# Patient Record
Sex: Female | Born: 1940 | Race: White | Hispanic: No | Marital: Married | Smoking: Former smoker
Health system: Southern US, Community
[De-identification: ages and names within clinical notes are randomized; demographics above are authoritative.]

## PROBLEM LIST (undated history)

## (undated) DIAGNOSIS — I509 Heart failure, unspecified: Secondary | ICD-10-CM

## (undated) DIAGNOSIS — I10 Essential (primary) hypertension: Secondary | ICD-10-CM

## (undated) DIAGNOSIS — K219 Gastro-esophageal reflux disease without esophagitis: Secondary | ICD-10-CM

## (undated) DIAGNOSIS — I2510 Atherosclerotic heart disease of native coronary artery without angina pectoris: Secondary | ICD-10-CM

## (undated) DIAGNOSIS — Z87891 Personal history of nicotine dependence: Secondary | ICD-10-CM

## (undated) DIAGNOSIS — D638 Anemia in other chronic diseases classified elsewhere: Secondary | ICD-10-CM

## (undated) DIAGNOSIS — E538 Deficiency of other specified B group vitamins: Secondary | ICD-10-CM

## (undated) DIAGNOSIS — N189 Chronic kidney disease, unspecified: Secondary | ICD-10-CM

## (undated) DIAGNOSIS — I5042 Chronic combined systolic (congestive) and diastolic (congestive) heart failure: Secondary | ICD-10-CM

## (undated) DIAGNOSIS — Q6119 Other polycystic kidney, infantile type: Secondary | ICD-10-CM

## (undated) DIAGNOSIS — E785 Hyperlipidemia, unspecified: Secondary | ICD-10-CM

## (undated) DIAGNOSIS — J449 Chronic obstructive pulmonary disease, unspecified: Secondary | ICD-10-CM

## (undated) DIAGNOSIS — N184 Chronic kidney disease, stage 4 (severe): Secondary | ICD-10-CM

## (undated) DIAGNOSIS — E119 Type 2 diabetes mellitus without complications: Secondary | ICD-10-CM

## (undated) DIAGNOSIS — I255 Ischemic cardiomyopathy: Secondary | ICD-10-CM

## (undated) DIAGNOSIS — M87059 Idiopathic aseptic necrosis of unspecified femur: Secondary | ICD-10-CM

## (undated) DIAGNOSIS — E039 Hypothyroidism, unspecified: Secondary | ICD-10-CM

## (undated) HISTORY — PX: VESICOVAGINAL FISTULA CLOSURE W/ TAH: SUR271

## (undated) HISTORY — DX: Chronic kidney disease, unspecified: N18.9

## (undated) HISTORY — DX: Chronic kidney disease, stage 4 (severe): N18.4

## (undated) HISTORY — DX: Idiopathic aseptic necrosis of unspecified femur: M87.059

## (undated) HISTORY — DX: Essential (primary) hypertension: I10

## (undated) HISTORY — DX: Other polycystic kidney, infantile type: Q61.19

## (undated) HISTORY — PX: ULNAR NERVE REPAIR: SHX2594

## (undated) HISTORY — DX: Heart failure, unspecified: I50.9

## (undated) HISTORY — PX: CHOLECYSTECTOMY: SHX55

## (undated) HISTORY — DX: Ischemic cardiomyopathy: I25.5

## (undated) HISTORY — DX: Type 2 diabetes mellitus without complications: E11.9

## (undated) HISTORY — DX: Gastro-esophageal reflux disease without esophagitis: K21.9

## (undated) HISTORY — DX: Hypothyroidism, unspecified: E03.9

## (undated) HISTORY — DX: Atherosclerotic heart disease of native coronary artery without angina pectoris: I25.10

## (undated) HISTORY — DX: Chronic obstructive pulmonary disease, unspecified: J44.9

## (undated) HISTORY — PX: OTHER SURGICAL HISTORY: SHX169

## (undated) HISTORY — DX: Deficiency of other specified B group vitamins: E53.8

---

## 2002-03-15 DIAGNOSIS — I2510 Atherosclerotic heart disease of native coronary artery without angina pectoris: Secondary | ICD-10-CM | POA: Diagnosis present

## 2002-03-15 HISTORY — DX: Atherosclerotic heart disease of native coronary artery without angina pectoris: I25.10

## 2004-01-22 ENCOUNTER — Ambulatory Visit: Payer: Self-pay | Admitting: Family Medicine

## 2004-08-19 ENCOUNTER — Ambulatory Visit: Payer: Self-pay

## 2004-09-06 ENCOUNTER — Emergency Department: Payer: Self-pay | Admitting: Unknown Physician Specialty

## 2004-10-28 ENCOUNTER — Ambulatory Visit: Payer: Self-pay | Admitting: Family Medicine

## 2004-11-18 ENCOUNTER — Ambulatory Visit: Payer: Self-pay

## 2004-11-19 ENCOUNTER — Ambulatory Visit: Payer: Self-pay | Admitting: Unknown Physician Specialty

## 2004-12-14 ENCOUNTER — Ambulatory Visit: Payer: Self-pay | Admitting: Family Medicine

## 2005-01-18 ENCOUNTER — Other Ambulatory Visit: Payer: Self-pay

## 2005-01-25 ENCOUNTER — Inpatient Hospital Stay: Payer: Self-pay | Admitting: General Practice

## 2005-04-15 ENCOUNTER — Inpatient Hospital Stay: Payer: Self-pay | Admitting: General Practice

## 2005-08-24 ENCOUNTER — Ambulatory Visit: Payer: Self-pay

## 2006-01-21 ENCOUNTER — Ambulatory Visit: Payer: Self-pay | Admitting: General Practice

## 2006-04-22 ENCOUNTER — Ambulatory Visit: Payer: Self-pay | Admitting: Family Medicine

## 2007-03-31 ENCOUNTER — Ambulatory Visit: Payer: Self-pay | Admitting: Family Medicine

## 2007-04-17 ENCOUNTER — Ambulatory Visit: Payer: Self-pay | Admitting: Family Medicine

## 2007-05-04 ENCOUNTER — Ambulatory Visit: Payer: Self-pay | Admitting: Family Medicine

## 2007-06-14 IMAGING — CR DG HIP COMPLETE 2+V*L*
1 series · 2 of 2 positions shown · non-contrast
Comparison: none

REASON FOR EXAM: hip pain
COMMENTS:  LMP: Post-Menopausal

[Series 1: view not recorded · 0.17mm/px · 2 of 2 slices shown]
[im 1/2]
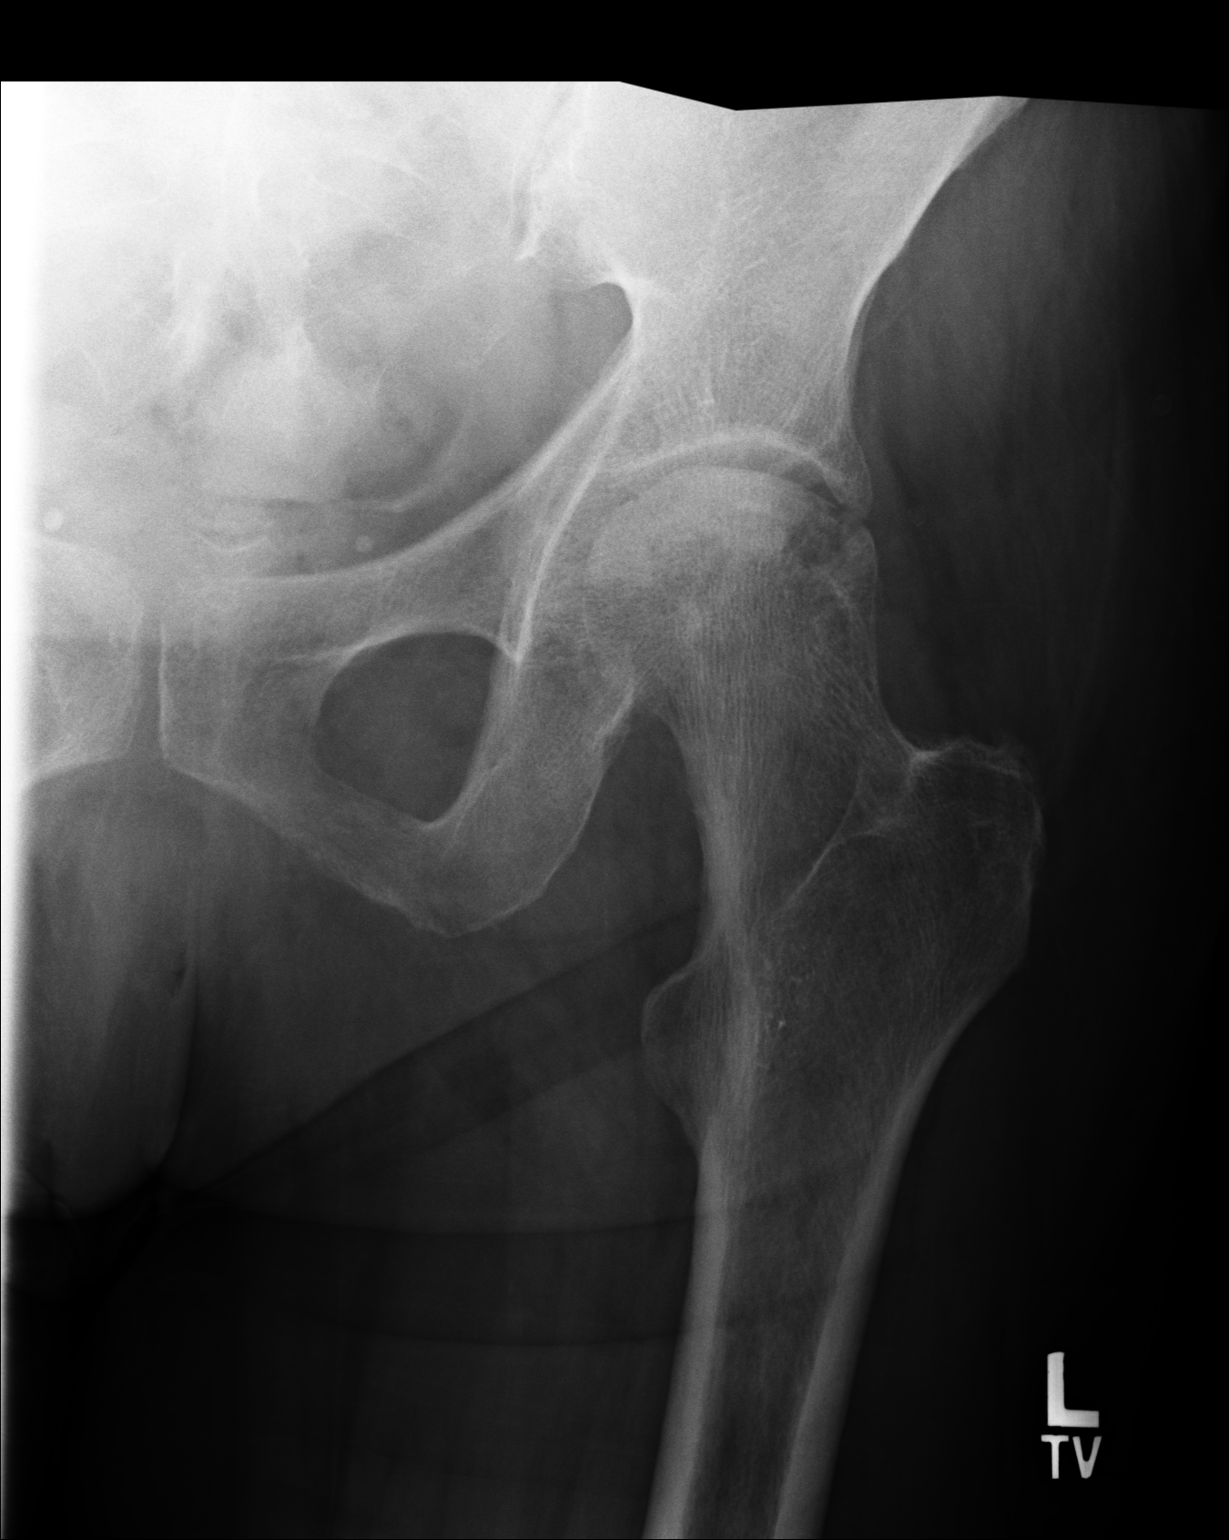
[im 2/2]
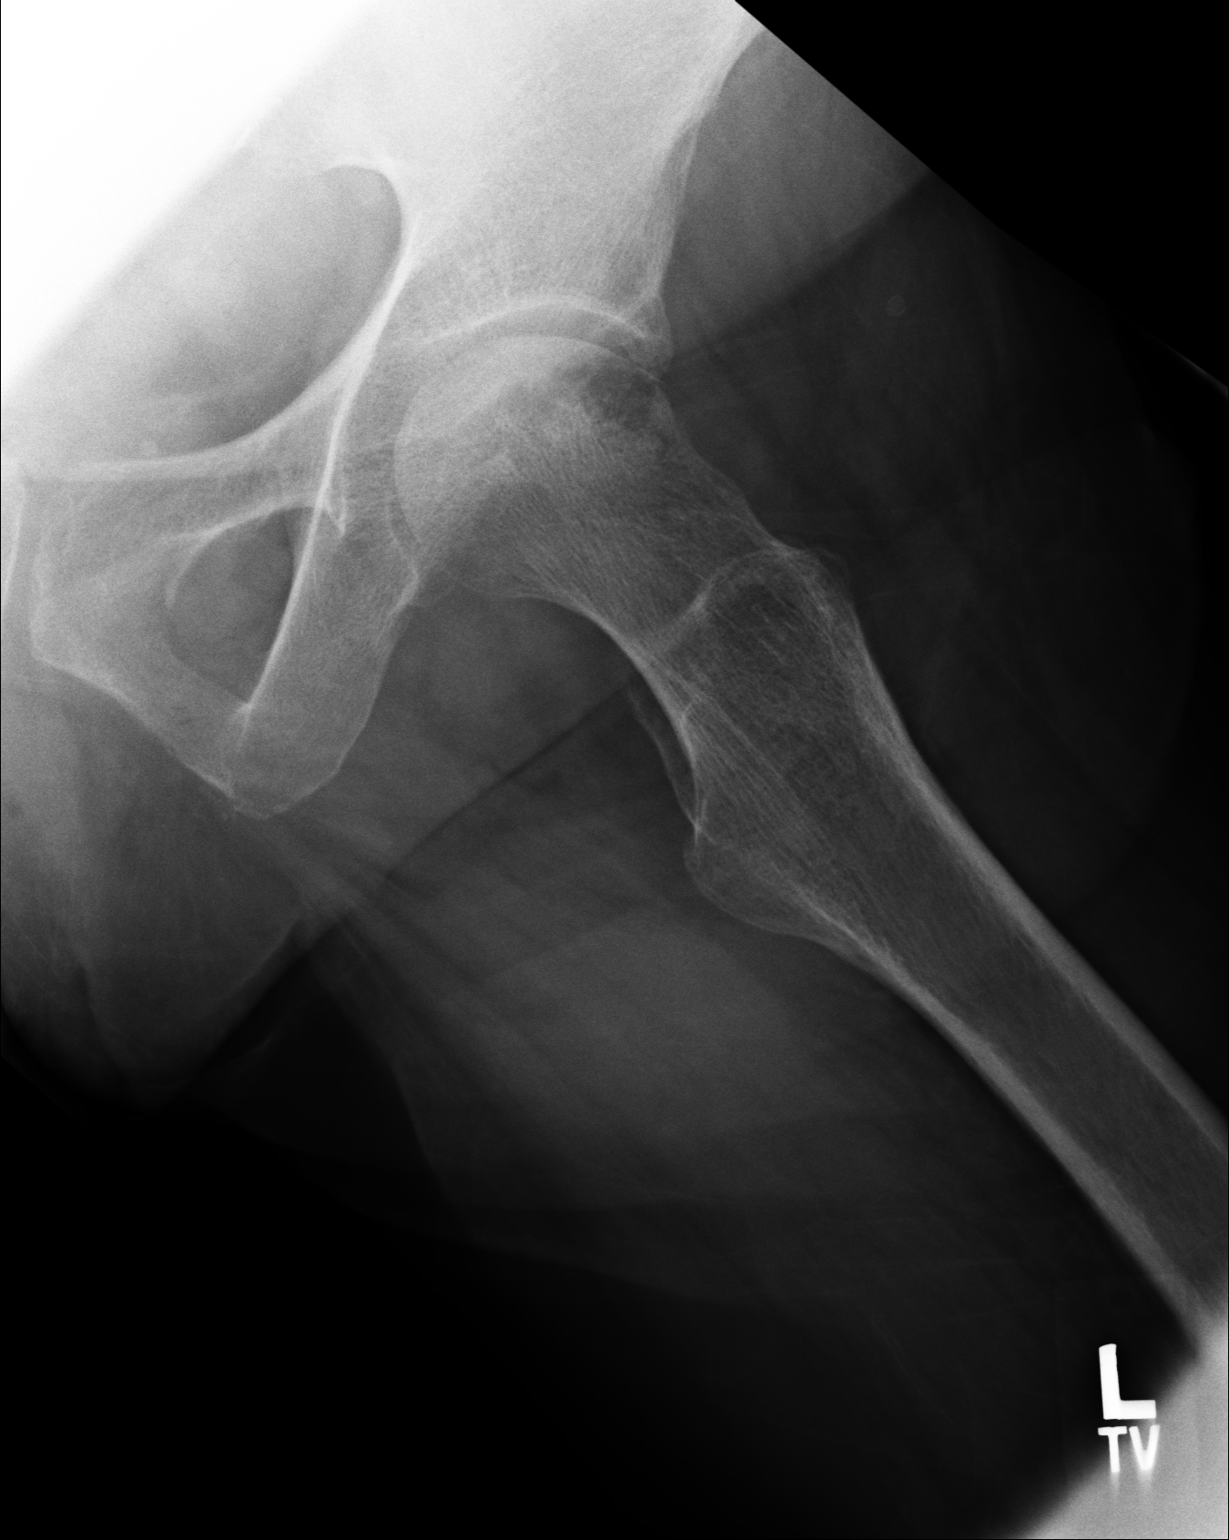

[2 of 2 positions shown; findings below may reference images not displayed]

PROCEDURE:     DXR - DXR HIP LEFT COMPLETE  - September 06, 2004  [DATE]

RESULT:       AP and lateral views of the LEFT hip show a 1.0 cm radiolucent
lesion of the femoral head.   Cystic change, infection and neoplasm are all
considerations in the differential at this point.  Further evaluation of the
hip by MR is suggested.  Note is made that there is also a less prominent
radiolucency more medial in the femoral head which would favor the changes
being secondary to cysts. Aseptic necrosis would also be a consideration in
the differential.
IMPRESSION: There are noted cystic changes in the femoral head for which further
evaluation by MR is recommended.

## 2007-07-05 ENCOUNTER — Ambulatory Visit: Payer: Self-pay | Admitting: Family Medicine

## 2007-10-26 ENCOUNTER — Ambulatory Visit: Payer: Self-pay | Admitting: Surgery

## 2007-12-26 ENCOUNTER — Ambulatory Visit: Payer: Self-pay | Admitting: Surgery

## 2008-01-05 ENCOUNTER — Ambulatory Visit: Payer: Self-pay | Admitting: Family Medicine

## 2008-06-17 ENCOUNTER — Ambulatory Visit: Payer: Self-pay | Admitting: Cardiovascular Disease

## 2008-07-09 ENCOUNTER — Ambulatory Visit: Payer: Self-pay

## 2008-07-09 ENCOUNTER — Encounter: Payer: Self-pay | Admitting: Cardiovascular Disease

## 2008-07-23 ENCOUNTER — Telehealth: Payer: Self-pay | Admitting: Cardiovascular Disease

## 2008-08-21 ENCOUNTER — Ambulatory Visit: Payer: Self-pay | Admitting: Unknown Physician Specialty

## 2008-10-23 ENCOUNTER — Encounter: Admission: RE | Admit: 2008-10-23 | Discharge: 2008-10-23 | Payer: Self-pay | Admitting: Surgery

## 2008-10-23 ENCOUNTER — Other Ambulatory Visit: Admission: RE | Admit: 2008-10-23 | Discharge: 2008-10-23 | Payer: Self-pay | Admitting: Interventional Radiology

## 2008-10-23 ENCOUNTER — Encounter (INDEPENDENT_AMBULATORY_CARE_PROVIDER_SITE_OTHER): Payer: Self-pay | Admitting: Interventional Radiology

## 2008-12-17 ENCOUNTER — Ambulatory Visit: Payer: Self-pay | Admitting: Cardiovascular Disease

## 2008-12-17 DIAGNOSIS — I2510 Atherosclerotic heart disease of native coronary artery without angina pectoris: Secondary | ICD-10-CM

## 2008-12-17 DIAGNOSIS — I739 Peripheral vascular disease, unspecified: Secondary | ICD-10-CM | POA: Insufficient documentation

## 2008-12-17 DIAGNOSIS — I70219 Atherosclerosis of native arteries of extremities with intermittent claudication, unspecified extremity: Secondary | ICD-10-CM | POA: Insufficient documentation

## 2008-12-17 DIAGNOSIS — F17200 Nicotine dependence, unspecified, uncomplicated: Secondary | ICD-10-CM | POA: Insufficient documentation

## 2009-01-14 ENCOUNTER — Encounter: Payer: Self-pay | Admitting: Cardiovascular Disease

## 2009-01-14 ENCOUNTER — Telehealth: Payer: Self-pay | Admitting: Cardiovascular Disease

## 2009-01-24 ENCOUNTER — Ambulatory Visit: Payer: Self-pay | Admitting: General Practice

## 2009-05-13 ENCOUNTER — Encounter: Admission: RE | Admit: 2009-05-13 | Discharge: 2009-05-13 | Payer: Self-pay | Admitting: Surgery

## 2009-07-17 ENCOUNTER — Ambulatory Visit: Payer: Self-pay | Admitting: Family Medicine

## 2009-08-25 ENCOUNTER — Ambulatory Visit: Payer: Self-pay | Admitting: Unknown Physician Specialty

## 2010-04-05 ENCOUNTER — Encounter: Payer: Self-pay | Admitting: Surgery

## 2010-07-28 NOTE — Assessment & Plan Note (Signed)
[facility] OFFICE NOTE   NAME:Mckenzie Taylor, Mckenzie Taylor                      MRN:          [id number]  DATE:06/17/2008                            DOB:          01-Mar-1941    PRIMARY CARE PHYSICIAN:  Dr. Denton Lank.   REASON FOR CONSULTATION:  Prior history of congestive heart failure and  coronary artery disease.  The patient here to establish cardiology care.   HISTORY OF PRESENT ILLNESS:  Mckenzie Taylor is a pleasant 70 year old  Caucasian female with a past medical history significant for mild  nonobstructive coronary artery disease by cath in 2004, hypertension,  hyperlipidemia, diabetes mellitus, COPD, GERD, and prior congestive  heart failure who is referred today to establish cardiology care.  The  patient tells me that she was admitted to the hospital on August 2004  with complaints of chest pain.  This was at [facility].  I was able to locate this visit in the computerized medical  record and see that she was admitted with chest pain and mild congestive  heart failure.  She underwent a diagnostic left heart catheterization  during that hospitalization that showed a 20% mid LAD stenosis, but  otherwise no evidence of coronary artery disease.  Her left ventricular  function was noted to be normal during that admission.  She has not seen  a cardiologist in the last 6 years since that hospitalization.  She  tells me that she has been doing well overall, but does have some  dyspnea with minimal exertion.  She continues to have dependent edema  during the day; however, this resolves at night.  She denies having any  episodes of palpitations, dizziness, near-syncope, syncope, orthopnea,  or PND.  She does occasionally have slight sharp chest pain that lasts  for 2-3 seconds.  There is no associated diaphoresis, nausea, shortness  of breath, palpitations, or dizziness with these episodes of chest  pain.   She has been followed in the office of Dr. Posey Pronto for her primary care  needs and tells me that she has had good control of her blood pressure  and cholesterol.  She unfortunately continues to smoke and has smoked 1-  pack a day for the last 50 years.   PAST MEDICAL HISTORY:  1. Nonobstructive coronary artery disease by cath in August 2004 with      a 20% mid LAD stenosis and no other significant blockages.  2. Hypertension.  3. Diabetes mellitus.  4. Hyperlipidemia.  5. COPD.  6. GERD.  7. Prior congestive heart failure.   PAST SURGICAL HISTORY:  1. 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  2. Right ulnar nerve surgery.  3. Hysterectomy.  4. Cholecystectomy.  5. Right eye lens replacement.   ALLERGIES:  The patient is not allergic to any drugs.  She does have a  sensitivity to metal.   MEDICATIONS:  1. Elavil 50 mg p.r.n.  2. Spiriva 18 mcg once daily.  3. Advair inhaler once daily.  4. Metformin 500 mg twice daily.  5. Vitamin B12 injections once monthly.  6. Vasotec 20 mg twice daily.  7. Zocor 80 mg once daily.  8. Omeprazole 20 mg once daily.  9. Lasix 40 mg once daily.  10.Amlodipine 10 mg once daily.  11.Oxygen via nasal cannula at nighttime.   SOCIAL HISTORY:  The patient tells me that she has smoked 1-pack of  cigarettes per day for the last 50 years.  Recently, she has only been  smoking half pack per day.  She denies use of alcohol or illicit drugs.  She is currently separated and has 2 adult children.  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   FAMILY HISTORY:  The patient's mother died at age 36 from congestive  heart failure.  Her father died from cancer at age 6.  She has 1  brother who is alive and healthy.   REVIEW OF SYSTEMS:  As stated in history of present illness is otherwise  negative.   PHYSICAL EXAMINATION:  VITALS:  Blood pressure 160/91, pulse 103 and  regular, respirations 12 and unlabored.   GENERAL:  She is a pleasant, middle-aged Caucasian female, in no acute  distress.  She is alert and oriented x3.  PSYCHIATRIC:  Mood and affect are appropriate.  MUSCULOSKELETAL:  Muscle strength and tone is normal.  NEUROLOGICAL:  No focal neurological deficits.  SKIN:  Warm and dry.  HEENT:  The patient has poor dentition, but overall has moist mucous  membranes.  NECK:  No JVD.  No carotid bruits.  No thyromegaly.  No lymphadenopathy.  LUNGS:  Clear to auscultation bilaterally with no evidence of wheezes,  rhonchi, or crackles.  CARDIOVASCULAR:  Tachycardiac with no loud murmurs, rubs, or gallops.  ABDOMEN:  Soft, nontender.  Bowel sounds are present.  EXTREMITIES:  There is trace bilateral lower extremity edema.  Pulses  are 2+ in all extremities.   DIAGNOSTIC STUDIES:  A 12-lead EKG obtained in our office today shows  sinus tachycardia.  The ventricular rate is 103 beats per minute.  There  are no ischemic changes noted on this EKG.   ASSESSMENT/PLAN:  This is a pleasant 70 year old Caucasian female with  known nonobstructive coronary artery disease by heart catheterization in  2004 who also has a history of hypertension, diabetes mellitus,  hyperlipidemia, GERD, COPD, and congestive heart failure, and presents  today to establish cardiology care.  The patient tells me that she has  mild-to-moderate dyspnea on exertion that has remained relatively stable  over the last several years.  She denies any chest pain that are  suggestive of obstructive coronary artery disease.  I would like to  continue all of her medications as currently written.  The patient will  be instructed to start aspirin 81 mg once daily.  I will also perform an  echocardiogram here in our office to assess her left ventricular  function given her degree of dyspnea with exertion.  I would like to see  her back in 6 months.  She is aware that she should call our office if  she has any change in her clinical  status.  I have encouraged her to  continue to follow up with her primary care physician, Dr. Posey Pronto.     Lauree Chandler, MD  Electronically Signed    CM/MedQ  DD: 06/17/2008  DT: 06/18/2008  Job #: [id number]   cc:   Denton Lank

## 2010-09-04 ENCOUNTER — Encounter: Payer: Self-pay | Admitting: Cardiovascular Disease

## 2011-03-23 ENCOUNTER — Ambulatory Visit: Payer: Self-pay | Admitting: Family Medicine

## 2011-04-22 ENCOUNTER — Other Ambulatory Visit (INDEPENDENT_AMBULATORY_CARE_PROVIDER_SITE_OTHER): Payer: Self-pay | Admitting: Surgery

## 2011-04-22 DIAGNOSIS — E042 Nontoxic multinodular goiter: Secondary | ICD-10-CM

## 2011-04-27 ENCOUNTER — Other Ambulatory Visit: Payer: Self-pay

## 2011-04-29 ENCOUNTER — Other Ambulatory Visit (INDEPENDENT_AMBULATORY_CARE_PROVIDER_SITE_OTHER): Payer: Self-pay

## 2011-04-29 DIAGNOSIS — E042 Nontoxic multinodular goiter: Secondary | ICD-10-CM

## 2011-04-29 NOTE — Progress Notes (Signed)
Addended by: Jorja Loa on: 04/29/2011 04:03 PM   Modules accepted: Orders

## 2011-04-30 ENCOUNTER — Ambulatory Visit: Payer: Self-pay

## 2011-05-04 ENCOUNTER — Encounter (INDEPENDENT_AMBULATORY_CARE_PROVIDER_SITE_OTHER): Payer: Self-pay | Admitting: Surgery

## 2011-05-04 ENCOUNTER — Ambulatory Visit (INDEPENDENT_AMBULATORY_CARE_PROVIDER_SITE_OTHER): Payer: Medicare Other | Admitting: Surgery

## 2011-05-04 VITALS — BP 140/88 | HR 102 | Temp 97.8°F | Resp 18 | Ht 65.0 in | Wt 198.0 lb

## 2011-05-04 DIAGNOSIS — E042 Nontoxic multinodular goiter: Secondary | ICD-10-CM | POA: Insufficient documentation

## 2011-05-04 NOTE — Progress Notes (Signed)
Visit Diagnoses: 1. Multinodular goiter (nontoxic)     HISTORY: Patient is a 71 year old white female last evaluated in my practice in March of 2011. Patient has known bilateral thyroid nodules. Fine-needle aspiration biopsy has been previously obtained and showed a follicular lesion without any evidence of malignancy. Patient has never been on thyroid medication. She has had no other head or neck surgery. Patient was lost to followup for nearly 2 years, but has now returned for evaluation.  Thyroid ultrasound was performed last week at [facility]. This demonstrated a stable multinodular thyroid gland. There had been no significant change in the bilateral thyroid nodules. The largest nodule on the right measures 2.6 cm in size. The largest nodule on the left measures 3.2 cm in size.  Laboratory studies from the patient's primary care office show a normal T3 and T4 level, however the TSH level is markedly suppressed at 0.038.  PERTINENT REVIEW OF SYSTEMS: Patient denies palpitations. She denies tremor. She denies weight changes. She denies sleep disorder.  EXAM: HEENT: normocephalic; pupils equal and reactive; sclerae clear; dentition good; mucous membranes moist NECK:  Multiple palpable thyroid nodules; no tenderness; asymmetric on extension; no palpable anterior or posterior cervical lymphadenopathy; no supraclavicular masses; no tenderness CHEST: clear to auscultation bilaterally without rales, rhonchi, or wheezes CARDIAC: regular rate and rhythm without significant murmur; peripheral pulses are full EXT:  non-tender without edema; no deformity NEURO: no gross focal deficits; no sign of tremor   IMPRESSION: Small multinodular thyroid goiter with history of benign cytopathology; rule out developing hyperthyroidism  PLAN: The patient and I reviewed all of the above findings. I believe her multinodular goiter is stable. She remains asymptomatic. She certainly shows no  clinical signs of hyperthyroidism. I have no reason to be concerned over thyroid malignancy at this point in time. I do not think she requires thyroidectomy.  However, the patient's TSH level appears to be markedly suppressed. I am going to ask her primary physician to repeat this level at her next office visit in March. If this remains markedly suppressed, then I think we should consider consultation with the patient's endocrinologist for further evaluation. She may require a nuclear thyroid scan.  Otherwise the patient should return to see me in one year for scheduled followup. We will obtain a thyroid ultrasound prior to that office visit.  Earnstine Regal, MD, [facility]   Primary:  Dr. Baltazar Apo Endocrine:  Dr. Kem Kays

## 2011-05-18 ENCOUNTER — Other Ambulatory Visit: Payer: Self-pay

## 2011-06-07 ENCOUNTER — Encounter (INDEPENDENT_AMBULATORY_CARE_PROVIDER_SITE_OTHER): Payer: Self-pay

## 2011-08-23 ENCOUNTER — Ambulatory Visit: Payer: Self-pay

## 2011-09-27 ENCOUNTER — Ambulatory Visit: Payer: Self-pay

## 2011-10-19 ENCOUNTER — Ambulatory Visit: Payer: Self-pay | Admitting: Unknown Physician Specialty

## 2011-10-19 LAB — CREATININE, SERUM
Creatinine: 1.08 mg/dL (ref 0.60–1.30)
EGFR (African American): >60
EGFR (Non-African Amer.): 52 — ABNORMAL LOW

## 2012-01-11 DIAGNOSIS — E278 Other specified disorders of adrenal gland: Secondary | ICD-10-CM | POA: Insufficient documentation

## 2012-04-13 ENCOUNTER — Other Ambulatory Visit (INDEPENDENT_AMBULATORY_CARE_PROVIDER_SITE_OTHER): Payer: Self-pay

## 2012-04-13 ENCOUNTER — Telehealth (INDEPENDENT_AMBULATORY_CARE_PROVIDER_SITE_OTHER): Payer: Self-pay

## 2012-04-13 DIAGNOSIS — E042 Nontoxic multinodular goiter: Secondary | ICD-10-CM

## 2012-04-13 NOTE — Telephone Encounter (Signed)
LMOM pt due for u/s and ov. Order in epic and pt can call [facility] to set up u/s.

## 2012-04-18 ENCOUNTER — Telehealth (INDEPENDENT_AMBULATORY_CARE_PROVIDER_SITE_OTHER): Payer: Self-pay

## 2012-04-18 NOTE — Telephone Encounter (Addendum)
Patient calling into office to report seeing a Physician at [facility] in [place] patient reports Physicians name Dr. Gerilyn Nestle but, uncertain of spelling.  Patient reports receiving Radioactive pill for 3 day's and her blood levels are normal.  Patient would like to know if she still need's her follow up appointment and Ultrasound in April.  Patient was un certain name of treatment, when ask if it was I131 treatment patient reports that it was a Radioactive  Pill (patient not sure of name of medication).  Please call patient to discuss in further detail.

## 2012-04-18 NOTE — Telephone Encounter (Signed)
Sent to Dr Harlow Asa to review.

## 2012-08-10 ENCOUNTER — Ambulatory Visit: Payer: Self-pay

## 2012-08-10 LAB — CREATININE, SERUM
Creatinine: 1.29 mg/dL (ref 0.60–1.30)
EGFR (African American): 49 — ABNORMAL LOW
EGFR (Non-African Amer.): 42 — ABNORMAL LOW

## 2012-10-21 ENCOUNTER — Inpatient Hospital Stay: Payer: Self-pay | Admitting: Internal Medicine

## 2012-10-21 LAB — URINALYSIS, COMPLETE
Bilirubin,UR: NEGATIVE
Glucose,UR: NEGATIVE mg/dL (ref 0–75)
Ketone: NEGATIVE
Nitrite: POSITIVE
Ph: 6 (ref 4.5–8.0)
Protein: >=500
RBC,UR: 11 /HPF (ref 0–5)
Specific Gravity: 1.015 (ref 1.003–1.030)
Squamous Epithelial: 1
WBC UR: 357 /HPF (ref 0–5)

## 2012-10-21 LAB — CBC
HCT: 32.9 % — ABNORMAL LOW (ref 35.0–47.0)
HGB: 11 g/dL — ABNORMAL LOW (ref 12.0–16.0)
MCH: 29.6 pg (ref 26.0–34.0)
MCHC: 33.3 g/dL (ref 32.0–36.0)
MCV: 89 fL (ref 80–100)
Platelet: 286 10*3/uL (ref 150–440)
RBC: 3.7 10*6/uL — ABNORMAL LOW (ref 3.80–5.20)
RDW: 15.6 % — ABNORMAL HIGH (ref 11.5–14.5)
WBC: 11.8 10*3/uL — ABNORMAL HIGH (ref 3.6–11.0)

## 2012-10-21 LAB — COMPREHENSIVE METABOLIC PANEL
Albumin: 2.5 g/dL — ABNORMAL LOW (ref 3.4–5.0)
Alkaline Phosphatase: 221 U/L — ABNORMAL HIGH (ref 50–136)
Anion Gap: 6 — ABNORMAL LOW (ref 7–16)
BUN: 16 mg/dL (ref 7–18)
Bilirubin,Total: 0.6 mg/dL (ref 0.2–1.0)
Calcium, Total: 8.8 mg/dL (ref 8.5–10.1)
Chloride: 106 mmol/L (ref 98–107)
Co2: 28 mmol/L (ref 21–32)
Creatinine: 1.71 mg/dL — ABNORMAL HIGH (ref 0.60–1.30)
EGFR (African American): 35 — ABNORMAL LOW
EGFR (Non-African Amer.): 30 — ABNORMAL LOW
Glucose: 154 mg/dL — ABNORMAL HIGH (ref 65–99)
Osmolality: 284 (ref 275–301)
Potassium: 3.7 mmol/L (ref 3.5–5.1)
SGOT(AST): 79 U/L — ABNORMAL HIGH (ref 15–37)
SGPT (ALT): 77 U/L (ref 12–78)
Sodium: 140 mmol/L (ref 136–145)
Total Protein: 7.4 g/dL (ref 6.4–8.2)

## 2012-10-22 LAB — CBC WITH DIFFERENTIAL/PLATELET
Basophil #: 0.1 10*3/uL (ref 0.0–0.1)
Basophil %: 0.7 %
Eosinophil #: 0 10*3/uL (ref 0.0–0.7)
Eosinophil %: 0.1 %
HCT: 28.6 % — ABNORMAL LOW (ref 35.0–47.0)
HGB: 9.8 g/dL — ABNORMAL LOW (ref 12.0–16.0)
Lymphocyte #: 1.4 10*3/uL (ref 1.0–3.6)
Lymphocyte %: 13.3 %
MCH: 30.8 pg (ref 26.0–34.0)
MCHC: 34.4 g/dL (ref 32.0–36.0)
MCV: 90 fL (ref 80–100)
Monocyte #: 0.9 x10 3/mm (ref 0.2–0.9)
Monocyte %: 8.3 %
Neutrophil #: 8.1 10*3/uL — ABNORMAL HIGH (ref 1.4–6.5)
Neutrophil %: 77.6 %
Platelet: 260 10*3/uL (ref 150–440)
RBC: 3.2 10*6/uL — ABNORMAL LOW (ref 3.80–5.20)
RDW: 15.6 % — ABNORMAL HIGH (ref 11.5–14.5)
WBC: 10.4 10*3/uL (ref 3.6–11.0)

## 2012-10-22 LAB — BASIC METABOLIC PANEL
Anion Gap: 7 (ref 7–16)
BUN: 17 mg/dL (ref 7–18)
Calcium, Total: 8.6 mg/dL (ref 8.5–10.1)
Chloride: 104 mmol/L (ref 98–107)
Co2: 28 mmol/L (ref 21–32)
Creatinine: 1.52 mg/dL — ABNORMAL HIGH (ref 0.60–1.30)
EGFR (African American): 40 — ABNORMAL LOW
EGFR (Non-African Amer.): 34 — ABNORMAL LOW
Glucose: 120 mg/dL — ABNORMAL HIGH (ref 65–99)
Osmolality: 280 (ref 275–301)
Potassium: 3.8 mmol/L (ref 3.5–5.1)
Sodium: 139 mmol/L (ref 136–145)

## 2012-10-23 LAB — URINE CULTURE

## 2012-10-24 LAB — CULTURE, BLOOD (SINGLE)

## 2012-10-26 LAB — CULTURE, BLOOD (SINGLE)

## 2012-10-28 LAB — CULTURE, BLOOD (SINGLE)

## 2013-08-05 ENCOUNTER — Emergency Department: Payer: Self-pay | Admitting: Emergency Medicine

## 2013-08-13 ENCOUNTER — Ambulatory Visit: Payer: Self-pay

## 2013-11-15 ENCOUNTER — Ambulatory Visit: Payer: Self-pay | Admitting: Surgery

## 2013-11-15 LAB — CBC
HCT: 35.8 % (ref 35.0–47.0)
HGB: 11.2 g/dL — ABNORMAL LOW (ref 12.0–16.0)
MCH: 29.5 pg (ref 26.0–34.0)
MCHC: 31.3 g/dL — ABNORMAL LOW (ref 32.0–36.0)
MCV: 94 fL (ref 80–100)
Platelet: 272 10*3/uL (ref 150–440)
RBC: 3.8 10*6/uL (ref 3.80–5.20)
RDW: 15.8 % — ABNORMAL HIGH (ref 11.5–14.5)
WBC: 8 10*3/uL (ref 3.6–11.0)

## 2013-11-15 LAB — POTASSIUM: Potassium: 3.6 mmol/L (ref 3.5–5.1)

## 2013-11-22 ENCOUNTER — Ambulatory Visit: Payer: Self-pay | Admitting: Surgery

## 2014-07-05 NOTE — H&P (Signed)
PATIENT NAME:  Mckenzie Taylor, Mckenzie Taylor MR#:  [id number] DATE OF BIRTH:  28-Jan-1941  DATE OF ADMISSION:  10/21/2012  REFERRING PHYSICIAN: Dr. Thomasene Lot.   FAMILY PHYSICIAN: Dr. Denton Lank.   REASON FOR ADMISSION: Abdominal pain with nausea.   HISTORY OF PRESENT ILLNESS: The patient is a 74 year old female with a history of COPD and chronic respiratory failure, on oxygen, with known history of polycystic kidney disease. Presented to the Emergency Room with right abdominal and flank pain radiating to the groin, associated with fever and nausea. In the Emergency Room, the patient was noted to be febrile with a mild elevation of her white count. CT suggests pyelonephritis. She is now admitted for further evaluation.   PAST MEDICAL HISTORY: 1.  Polycystic kidney disease.  2.  History of avascular necrosis.  3.  History of congestive heart failure.  4.  COPD/tobacco abuse.  5.  Chronic respiratory failure, on oxygen.  6.  Type 2 diabetes mellitus.  7.  Diabetic neuropathy.  8.  Benign hypertension.  9.  Osteoarthritis.  10.  GE reflux disease. 11.  Glaucoma.  12.  Obesity.  13.  Status post cholecystectomy.  14.  Status post hysterectomy.  15.  Status post bilateral hip surgery.   MEDICATIONS: 1.  Vitamin D 1000 units p.o. daily.  2.  B12, 5000 mcg p.o. daily.  3.  Spiriva 1 capsule inhaled daily.  4.  ProAir 2 puffs q.4 hours p.r.n. shortness of breath.  5.  Oxycodone 10 mg p.o. q.6 hours p.r.n. pain.  6.  Prilosec 20 mg p.o. daily.  7.  Metformin 500 mg p.o. b.i.d.  8.  Lasix 40 mg p.o. b.i.d.  9.  Vasotec 20 mg p.o. b.i.d.   10.  Flexeril 10 mg p.o. q.8 hours p.r.n.  11.  Lipitor 40 mg p.o. daily.  12.  Norvasc 10 mg p.o. daily.  13.  Advair 250/50, 1 puff b.i.d.   ALLERGIES: No known drug allergies.   SOCIAL HISTORY: The patient continues to smoke less than a pack per day. No history of alcohol abuse.   FAMILY HISTORY: Positive for diabetes, coronary artery disease, stroke and  hypertension. Negative for breast or colon cancer.   REVIEW OF SYSTEMS:    CONSTITUTIONAL: Has had fever, but no change in weight.  EYES: No blurred or double vision. Positive for glaucoma.  EARS, NOSE, THROAT: No tinnitus or hearing loss. No nasal discharge or bleeding. No difficulty swallowing.  RESPIRATORY: The patient has chronic cough, but denies wheezing or hemoptysis. No painful respiration.  CARDIOVASCULAR: No chest pain or orthopnea. No palpitations.  GASTROINTESTINAL: No vomiting or diarrhea. No change in bowel habits.  GENITOURINARY: No dysuria or hematuria. No incontinence.  ENDOCRINE: No polyuria or polydipsia. No heat or cold intolerance.  HEMATOLOGIC: The patient denies anemia, easy bruising or bleeding.  LYMPHATIC: No swollen glands.  MUSCULOSKELETAL: The patient denies pain in her neck, shoulders, although she does have pain in her back, knees and hips. No gout.  NEUROLOGIC: No numbness or migraines. Denies stroke or seizures.  PSYCHIATRIC: The patient denies anxiety, insomnia or depression.   PHYSICAL EXAMINATION: GENERAL: The patient is obese, in no acute distress.  VITAL SIGNS: Currently remarkable for a blood pressure of 158/85 with a heart rate of 105 and a respiratory rate of 20. Temperature 99.3. Sats 93% on room air.  HEENT: Normocephalic, atraumatic. Pupils equally round and reactive to light and accommodation. Extraocular movements are intact. Sclerae are anicteric. Conjunctivae are clear. Oropharynx is dry,  but clear.  NECK: Supple without JVD. No adenopathy or thyromegaly is noted.  LUNGS: Scattered rhonchi with an occasional wheeze. No rales. No dullness. Respiratory effort is normal.  CARDIAC: Rapid rate with a regular rhythm. Normal S1, S2. No significant rubs or gallops.  ABDOMEN: Soft, nontender with normoactive bowel sounds. No organomegaly or masses were appreciated. No hernias or bruits were noted.  EXTREMITIES: Without clubbing, cyanosis or edema. Pulses  were 2+ bilaterally.  SKIN: Warm and dry without rash or lesions.  NEUROLOGIC: Cranial nerves II through XII grossly intact. Deep tendon reflexes were symmetric. Motor and sensory exams nonfocal.  PSYCHIATRIC: Revealed a patient who is alert and oriented to person, place and time. She was cooperative and used good judgment.   LABORATORY AND RADIOLOGICAL DATA: White count was 11.8 with a hemoglobin of 11.0. Glucose was 154 with a BUN of 16, creatinine 1.71 with a sodium of 140 and a potassium of 3.7 with a GFR of 30. Urinalysis revealed 3+ leukocyte esterase with 357 WBCs per high-power field with 1+ bacteria. CT of the abdomen showed polycystic kidneys with stranding consistent with pyelonephritis.   ASSESSMENT: 1.  Acute pyelonephritis.  2.  Polycystic kidney disease.  3.  Anemia of chronic disease.  4.  Type 2 diabetes.  5.  Acute on chronic renal failure.  6.  Chronic obstructive pulmonary disease.  7.  Benign hypertension.  8.  Osteoarthritis.  9.  Glaucoma.  10.  Chronic pain.   PLAN: The patient will be admitted to the floor. Blood and urine cultures have been sent. She will be started on IV fluids with IV antibiotics. We will continue her pulmonary regimen and oxygen for now. We will follow her respiratory status closely. We will follow her sugars with Accu-Cheks before meals and at bedtime and add sliding scale insulin as needed. Clear liquid diet for now. Zofran as needed for nausea and oxycodone as needed for abdominal pain. Chest x-ray today because of her underlying lung disease. Follow up routine labs in the morning. Further treatment and evaluation will depend upon the patient's progress.   TIME SPENT: Total time spent on this patient was 50 minutes.    ____________________________ Leonie Douglas Doy Hutching, MD jds:jm D: 10/21/2012 15:23:48 ET T: 10/21/2012 16:18:20 ET JOB#: [id number]  cc: Leonie Douglas. Doy Hutching, MD, <Dictator> Sarah "Baltazar Apo, MD Tayleigh Wetherell Lennice Sites  MD ELECTRONICALLY SIGNED 10/21/2012 17:25

## 2014-07-05 NOTE — Discharge Summary (Signed)
PATIENT NAME:  Mckenzie Taylor, Mckenzie Taylor MR#:  [id number] DATE OF BIRTH:  [birth year]  DATE OF ADMISSION:  10/21/2012 DATE OF DISCHARGE:  10/23/2012  PRESENTING COMPLAINT: Fever and weakness.   DISCHARGE DIAGNOSES: 1.  Escherichia coli sepsis.  2.  Escherichia coli urinary tract infection.  3.  Hypertension.  4.  Type 2 diabetes.   CODE STATUS: FULL CODE.   DISCHARGE MEDICATIONS: 1.  Advair 250/50 one puff b.i.d.  2.  ProAir HFA 90 mcg 2 puffs 4 times a day as needed.  3.  Enalapril 20 mg b.i.d.  4.  Lasix 40 mg b.i.d.  5.  Metformin 500 mg b.i.d.  6.  Amlodipine 10 mg at bedtime.  7.  Omeprazole 20 mg daily.  8.  Spiriva 1 capsule inhalation daily.  9.  Vitamin B12 500 mcg p.o. daily.  10.  Vitamin D3 1 tablet daily.  11.  Oxycodone 10 mg 1 tablet every 6 hours as needed.  12.  Atorvastatin 40 mg at bedtime.  13.  Cyclobenzaprine 10 mg 3 times a day as needed.  14.  Levaquin 500 mg daily.   DISCHARGE DIET: Low sodium, carbohydrate controlled ADA 1800 calorie.  DISCHARGE FOLLOWUP:  With Dr. Denton Lank at [facility] in 1 to 2 weeks.  LABORATORY AND DIAGNOSTICS: Repeat blood cultures, 08/11:  No growth in 8 to 12 hours. White count is 10.4, H and H are 9.8 and 28.6. Glucose 120, BUN 17 creatinine 1.5, sodium 130, potassium 3.8, chloride 104 and bicarbonate 28.   Blood cultures on admission was E. coli in 4 out of 4 bottles.   Chest x-ray: No acute cardiopulmonary abnormality.   CT of the abdomen and pelvis showed polycystic right kidney appears slightly enlarged above baseline, surrounding increased density in the perinephric fat. This may reflect pyelonephritis. There is no evidence of any significant obstruction on the right. There has been mild increase in the size of adrenal mass on the left which measures 4.2 cm. There is sigmoid diverticulosis but no evidence of diverticulitis.  UA positive for UTI. Urine culture positive for E. coli which is pansensitive.   HOSPITAL COURSE:  Mckenzie Taylor is a 74 year old Caucasian female with past medical history of polycystic kidney disease who comes in with: 1.  Sepsis. Source was urine which grew E. coli. Blood cultures were positive for E. coli. The patient was on Rocephin and Levaquin was changed to p.o. Levaquin. Will get p.o. total 10 days. No fever. Repeat blood cultures remain negative in 8 to 12 hours. The patient's white count was stable.  2.  E. coli UTI. The patient will finish up a 10 day course of Levaquin.  3.  Acute on chronic CKD stage III. IV fluids were given. The patient's creatinine on admission was 1.7, at discharge 1.52. 4.  Acute pyelonephritis/UTI E. coli. Will finish a course of Levaquin for 10 days.  5.  Known history of left adrenal mass. Follows with Dr. Gabriel Carina as outpatient.  6.  COPD.  Remained stable. The patient uses oxygen at night and p.r.n. inhalers were continued.   Hospital stay otherwise remained stable. The patient remained a FULL CODE.   TIME SPENT: 40 minutes.  ____________________________ Hart Rochester Posey Pronto, MD sap:sb D: 10/24/2012 07:08:35 ET T: 10/24/2012 07:28:26 ET JOB#: [id number]  cc: Kandice Schmelter A. Posey Pronto, MD, <Dictator> Sarah "Sallie" Posey Pronto, MD Ilda Basset MD ELECTRONICALLY SIGNED 11/03/2012 5:41

## 2014-07-06 NOTE — Op Note (Signed)
PATIENT NAME:  Mckenzie Taylor, Mckenzie Taylor MR#:  [id number] DATE OF BIRTH:  May 03, 1940  DATE OF PROCEDURE:  11/22/2013  PREOPERATIVE DIAGNOSIS: Umbilical hernia.   POSTOPERATIVE DIAGNOSIS: Umbilical hernia.   PROCEDURE: Umbilical hernia repair.   SURGEON: Rochel Brome, MD.   ANESTHESIA: General.   INDICATIONS: This 74 year old female has had bulging at the umbilicus gradually increasing in size and having some associated pain. An umbilical hernia was demonstrated on physical exam and repair was recommended for definitive treatment.   DESCRIPTION OF PROCEDURE: The patient was placed on the operating table in the supine position under general endotracheal anesthesia. The abdomen was prepared with ChloraPrep and draped in a sterile manner.   A transversely oriented suprapubic incision was made, carried down through subcutaneous tissues to encounter umbilical hernia sac which was dissected free from the surrounding structures down to the fascial ring defect. There was incarcerated omentum within the sac.  A portion of the sac was opened. The fascial incision was lengthened on the right side to allow reduction of the hernia. The sac was dissected away from the fascial ring defect and properitoneal fat was dissected away from the fascial ring defect extending approximately 1 cm above and below. Next a Bard soft mesh was cut to create an oval shape of some 1.4 x 2 cm and was placed into the properitoneal plane, oriented transversely,  sutured to the overlying fascia with through and through 0 Surgilon sutures. Next the fascia was closed with a transversely oriented suture line of interrupted 0 Surgilon figure-of-8 sutures incorporating each suture into the mesh. It is noted that during the course of the procedure a number of small bleeding points were cauterized. Hemostasis was subsequently intact. The deep fascia and subcutaneous tissues were infiltrated with 0.5% Sensorcaine with epinephrine. The skin of the  umbilicus was sutured to the deep fascia with 5-0 Monocryl. The skin was closed with running 5-0 Monocryl subcuticular suture and Dermabond. The patient tolerated surgery satisfactorily and was prepared for transfer to the recovery room.     ____________________________ Lenna Sciara. Rochel Brome, MD jws:bu D: 11/22/2013 13:04:28 ET T: 11/22/2013 13:42:12 ET JOB#: [id number]  cc: Loreli Dollar, MD, <Dictator> Loreli Dollar MD ELECTRONICALLY SIGNED 11/23/2013 17:22

## 2014-07-24 ENCOUNTER — Other Ambulatory Visit: Payer: Self-pay | Admitting: Family Medicine

## 2014-07-24 DIAGNOSIS — Z780 Asymptomatic menopausal state: Secondary | ICD-10-CM

## 2014-07-24 DIAGNOSIS — E559 Vitamin D deficiency, unspecified: Secondary | ICD-10-CM

## 2014-07-24 DIAGNOSIS — Z0000 Encounter for general adult medical examination without abnormal findings: Secondary | ICD-10-CM

## 2014-07-24 DIAGNOSIS — Z1231 Encounter for screening mammogram for malignant neoplasm of breast: Secondary | ICD-10-CM

## 2014-08-07 ENCOUNTER — Ambulatory Visit: Payer: Self-pay

## 2014-09-11 ENCOUNTER — Ambulatory Visit: Payer: Self-pay | Attending: Family Medicine

## 2015-01-15 ENCOUNTER — Other Ambulatory Visit: Payer: Self-pay

## 2015-01-15 ENCOUNTER — Ambulatory Visit: Payer: Self-pay

## 2015-01-30 ENCOUNTER — Ambulatory Visit: Payer: Self-pay

## 2015-02-13 ENCOUNTER — Ambulatory Visit: Payer: Self-pay | Attending: Family Medicine

## 2016-03-24 ENCOUNTER — Emergency Department: Payer: Medicare Other

## 2016-03-24 ENCOUNTER — Inpatient Hospital Stay
Admission: EM | Admit: 2016-03-24 | Discharge: 2016-03-28 | DRG: 193 | Disposition: A | Discharge status: Home Health | Payer: Medicare Other | Attending: Internal Medicine | Admitting: Internal Medicine

## 2016-03-24 ENCOUNTER — Encounter: Payer: Self-pay | Admitting: Emergency Medicine

## 2016-03-24 DIAGNOSIS — Q613 Polycystic kidney, unspecified: Secondary | ICD-10-CM

## 2016-03-24 DIAGNOSIS — N183 Chronic kidney disease, stage 3 (moderate): Secondary | ICD-10-CM | POA: Diagnosis present

## 2016-03-24 DIAGNOSIS — Z96643 Presence of artificial hip joint, bilateral: Secondary | ICD-10-CM | POA: Diagnosis present

## 2016-03-24 DIAGNOSIS — J9620 Acute and chronic respiratory failure, unspecified whether with hypoxia or hypercapnia: Secondary | ICD-10-CM | POA: Diagnosis not present

## 2016-03-24 DIAGNOSIS — I2510 Atherosclerotic heart disease of native coronary artery without angina pectoris: Secondary | ICD-10-CM | POA: Diagnosis present

## 2016-03-24 DIAGNOSIS — J440 Chronic obstructive pulmonary disease with acute lower respiratory infection: Secondary | ICD-10-CM | POA: Diagnosis present

## 2016-03-24 DIAGNOSIS — J181 Lobar pneumonia, unspecified organism: Secondary | ICD-10-CM | POA: Diagnosis not present

## 2016-03-24 DIAGNOSIS — Z87891 Personal history of nicotine dependence: Secondary | ICD-10-CM

## 2016-03-24 DIAGNOSIS — J9600 Acute respiratory failure, unspecified whether with hypoxia or hypercapnia: Secondary | ICD-10-CM | POA: Diagnosis present

## 2016-03-24 DIAGNOSIS — J9621 Acute and chronic respiratory failure with hypoxia: Secondary | ICD-10-CM | POA: Diagnosis present

## 2016-03-24 DIAGNOSIS — J189 Pneumonia, unspecified organism: Secondary | ICD-10-CM | POA: Diagnosis present

## 2016-03-24 DIAGNOSIS — Z7982 Long term (current) use of aspirin: Secondary | ICD-10-CM | POA: Diagnosis not present

## 2016-03-24 DIAGNOSIS — E039 Hypothyroidism, unspecified: Secondary | ICD-10-CM | POA: Diagnosis present

## 2016-03-24 DIAGNOSIS — Z79899 Other long term (current) drug therapy: Secondary | ICD-10-CM | POA: Diagnosis not present

## 2016-03-24 DIAGNOSIS — K219 Gastro-esophageal reflux disease without esophagitis: Secondary | ICD-10-CM | POA: Diagnosis present

## 2016-03-24 DIAGNOSIS — R262 Difficulty in walking, not elsewhere classified: Secondary | ICD-10-CM

## 2016-03-24 DIAGNOSIS — I509 Heart failure, unspecified: Secondary | ICD-10-CM

## 2016-03-24 DIAGNOSIS — Z9049 Acquired absence of other specified parts of digestive tract: Secondary | ICD-10-CM

## 2016-03-24 DIAGNOSIS — M6281 Muscle weakness (generalized): Secondary | ICD-10-CM

## 2016-03-24 DIAGNOSIS — Z7984 Long term (current) use of oral hypoglycemic drugs: Secondary | ICD-10-CM | POA: Diagnosis not present

## 2016-03-24 DIAGNOSIS — J9690 Respiratory failure, unspecified, unspecified whether with hypoxia or hypercapnia: Secondary | ICD-10-CM

## 2016-03-24 DIAGNOSIS — J441 Chronic obstructive pulmonary disease with (acute) exacerbation: Secondary | ICD-10-CM | POA: Diagnosis present

## 2016-03-24 DIAGNOSIS — J9622 Acute and chronic respiratory failure with hypercapnia: Secondary | ICD-10-CM | POA: Diagnosis not present

## 2016-03-24 DIAGNOSIS — J9601 Acute respiratory failure with hypoxia: Secondary | ICD-10-CM

## 2016-03-24 DIAGNOSIS — I5032 Chronic diastolic (congestive) heart failure: Secondary | ICD-10-CM | POA: Diagnosis present

## 2016-03-24 LAB — BASIC METABOLIC PANEL
Anion gap: 10 (ref 5–15)
BUN: 12 mg/dL (ref 6–20)
CO2: 23 mmol/L (ref 22–32)
Calcium: 8.6 mg/dL — ABNORMAL LOW (ref 8.9–10.3)
Chloride: 107 mmol/L (ref 101–111)
Creatinine, Ser: 1.48 mg/dL — ABNORMAL HIGH (ref 0.44–1.00)
GFR calc Af Amer: 39 mL/min — ABNORMAL LOW (ref >60)
GFR calc non Af Amer: 34 mL/min — ABNORMAL LOW (ref >60)
Glucose, Bld: 217 mg/dL — ABNORMAL HIGH (ref 65–99)
Potassium: 4.5 mmol/L (ref 3.5–5.1)
Sodium: 140 mmol/L (ref 135–145)

## 2016-03-24 LAB — CBC WITH DIFFERENTIAL/PLATELET
Basophils Absolute: 0.1 10*3/uL (ref 0–0.1)
Basophils Relative: 1 %
Eosinophils Absolute: 0 10*3/uL (ref 0–0.7)
Eosinophils Relative: 0 %
HCT: 30.8 % — ABNORMAL LOW (ref 35.0–47.0)
Hemoglobin: 9.6 g/dL — ABNORMAL LOW (ref 12.0–16.0)
Lymphocytes Relative: 2 %
Lymphs Abs: 0.4 10*3/uL — ABNORMAL LOW (ref 1.0–3.6)
MCH: 27 pg (ref 26.0–34.0)
MCHC: 31.2 g/dL — ABNORMAL LOW (ref 32.0–36.0)
MCV: 86.6 fL (ref 80.0–100.0)
Monocytes Absolute: 0.8 10*3/uL (ref 0.2–0.9)
Monocytes Relative: 4 %
Neutro Abs: 15.9 10*3/uL — ABNORMAL HIGH (ref 1.4–6.5)
Neutrophils Relative %: 93 %
Platelets: 346 10*3/uL (ref 150–440)
RBC: 3.55 MIL/uL — ABNORMAL LOW (ref 3.80–5.20)
RDW: 18.3 % — ABNORMAL HIGH (ref 11.5–14.5)
WBC: 17.2 10*3/uL — ABNORMAL HIGH (ref 3.6–11.0)

## 2016-03-24 LAB — GLUCOSE, CAPILLARY
Glucose-Capillary: 251 mg/dL — ABNORMAL HIGH (ref 65–99)
Glucose-Capillary: 272 mg/dL — ABNORMAL HIGH (ref 65–99)
Glucose-Capillary: 222 mg/dL — ABNORMAL HIGH (ref 65–99)

## 2016-03-24 LAB — TSH: TSH: 1.459 u[IU]/mL (ref 0.350–4.500)

## 2016-03-24 LAB — TROPONIN I: Troponin I: <0.03 ng/mL (ref <0.03)

## 2016-03-24 LAB — RAPID INFLUENZA A&B ANTIGENS
Influenza A (ARMC): NEGATIVE
Influenza B (ARMC): NEGATIVE

## 2016-03-24 LAB — BRAIN NATRIURETIC PEPTIDE: B Natriuretic Peptide: 174 pg/mL — ABNORMAL HIGH (ref 0.0–100.0)

## 2016-03-24 LAB — PROCALCITONIN: Procalcitonin: 0.26 ng/mL

## 2016-03-24 MED ORDER — ORAL CARE MOUTH RINSE
15.0000 mL | Freq: Two times a day (BID) | OROMUCOSAL | Status: DC
  Administered 2016-03-25 – 2016-03-27 (×4): 15 mL via OROMUCOSAL

## 2016-03-24 MED ORDER — ENOXAPARIN SODIUM 40 MG/0.4ML ~~LOC~~ SOLN
40.0000 mg | SUBCUTANEOUS | Status: DC
  Administered 2016-03-24 – 2016-03-27 (×4): 40 mg via SUBCUTANEOUS
  Filled 2016-03-24 (×4): qty 0.4

## 2016-03-24 MED ORDER — DEXTROSE 5 % IV SOLN: 1.0000 g | Freq: Once | INTRAVENOUS | Status: DC

## 2016-03-24 MED ORDER — ASPIRIN EC 81 MG PO TBEC
81.0000 mg | DELAYED_RELEASE_TABLET | Freq: Every day | ORAL | Status: DC
  Administered 2016-03-25 – 2016-03-28 (×4): 81 mg via ORAL
  Filled 2016-03-24 (×4): qty 1

## 2016-03-24 MED ORDER — IPRATROPIUM-ALBUTEROL 0.5-2.5 (3) MG/3ML IN SOLN
3.0000 mL | Freq: Once | RESPIRATORY_TRACT | Status: AC
  Administered 2016-03-24: 3 mL via RESPIRATORY_TRACT
  Filled 2016-03-24: qty 6

## 2016-03-24 MED ORDER — PANTOPRAZOLE SODIUM 40 MG PO TBEC
40.0000 mg | DELAYED_RELEASE_TABLET | Freq: Every day | ORAL | Status: DC
  Administered 2016-03-25 – 2016-03-28 (×4): 40 mg via ORAL
  Filled 2016-03-24 (×5): qty 1

## 2016-03-24 MED ORDER — IPRATROPIUM-ALBUTEROL 0.5-2.5 (3) MG/3ML IN SOLN
3.0000 mL | Freq: Once | RESPIRATORY_TRACT | Status: AC
  Administered 2016-03-24: 3 mL via RESPIRATORY_TRACT
  Filled 2016-03-24: qty 3

## 2016-03-24 MED ORDER — IPRATROPIUM-ALBUTEROL 0.5-2.5 (3) MG/3ML IN SOLN: 3.0000 mL | Freq: Four times a day (QID) | RESPIRATORY_TRACT | Status: DC | PRN

## 2016-03-24 MED ORDER — INSULIN ASPART 100 UNIT/ML ~~LOC~~ SOLN
4.0000 [IU] | Freq: Three times a day (TID) | SUBCUTANEOUS | Status: DC
  Administered 2016-03-24 – 2016-03-28 (×11): 4 [IU] via SUBCUTANEOUS
  Filled 2016-03-24: qty 4
  Filled 2016-03-24: qty 2
  Filled 2016-03-24 (×10): qty 4

## 2016-03-24 MED ORDER — DEXTROSE 5 % IV SOLN
500.0000 mg | Freq: Once | INTRAVENOUS | Status: AC
  Administered 2016-03-24: 500 mg via INTRAVENOUS
  Filled 2016-03-24: qty 500

## 2016-03-24 MED ORDER — ATORVASTATIN CALCIUM 20 MG PO TABS
40.0000 mg | ORAL_TABLET | Freq: Every day | ORAL | Status: DC
Start: 2016-03-25 — End: 2016-03-28
  Administered 2016-03-25 – 2016-03-28 (×4): 40 mg via ORAL
  Filled 2016-03-24 (×4): qty 2

## 2016-03-24 MED ORDER — INSULIN ASPART 100 UNIT/ML ~~LOC~~ SOLN: 0.0000 [IU] | Freq: Three times a day (TID) | SUBCUTANEOUS | Status: DC

## 2016-03-24 MED ORDER — CEFTRIAXONE SODIUM-DEXTROSE 1-3.74 GM-% IV SOLR
1.0000 g | Freq: Once | INTRAVENOUS | Status: AC
  Administered 2016-03-24: 1 g via INTRAVENOUS
  Filled 2016-03-24: qty 50

## 2016-03-24 MED ORDER — GABAPENTIN 100 MG PO CAPS
200.0000 mg | ORAL_CAPSULE | Freq: Three times a day (TID) | ORAL | Status: DC
  Administered 2016-03-24 – 2016-03-28 (×12): 200 mg via ORAL
  Filled 2016-03-24 (×12): qty 2

## 2016-03-24 MED ORDER — FUROSEMIDE 10 MG/ML IJ SOLN
40.0000 mg | Freq: Once | INTRAMUSCULAR | Status: DC
Start: 2016-03-24 — End: 2016-03-24

## 2016-03-24 MED ORDER — FLUOXETINE HCL 20 MG PO CAPS
40.0000 mg | ORAL_CAPSULE | Freq: Every day | ORAL | Status: DC
  Administered 2016-03-25 – 2016-03-27 (×3): 40 mg via ORAL
  Filled 2016-03-24 (×4): qty 2

## 2016-03-24 MED ORDER — FUROSEMIDE 10 MG/ML IJ SOLN: 60.0000 mg | Freq: Once | INTRAMUSCULAR | Status: DC

## 2016-03-24 MED ORDER — AMLODIPINE BESYLATE 10 MG PO TABS
10.0000 mg | ORAL_TABLET | Freq: Every day | ORAL | Status: DC
  Administered 2016-03-25 – 2016-03-28 (×4): 10 mg via ORAL
  Filled 2016-03-24 (×5): qty 1

## 2016-03-24 MED ORDER — AZITHROMYCIN 500 MG PO TABS
500.0000 mg | ORAL_TABLET | Freq: Every day | ORAL | Status: DC
  Administered 2016-03-25 – 2016-03-27 (×3): 500 mg via ORAL
  Filled 2016-03-24 (×3): qty 1

## 2016-03-24 MED ORDER — INSULIN ASPART 100 UNIT/ML ~~LOC~~ SOLN: 0.0000 [IU] | Freq: Every day | SUBCUTANEOUS | Status: DC

## 2016-03-24 MED ORDER — ASPIRIN 300 MG RE SUPP: 300.0000 mg | RECTAL | Status: DC

## 2016-03-24 MED ORDER — IPRATROPIUM-ALBUTEROL 0.5-2.5 (3) MG/3ML IN SOLN
3.0000 mL | Freq: Four times a day (QID) | RESPIRATORY_TRACT | Status: DC
Start: 2016-03-24 — End: 2016-03-27
  Administered 2016-03-24 – 2016-03-27 (×11): 3 mL via RESPIRATORY_TRACT
  Filled 2016-03-24 (×12): qty 3

## 2016-03-24 MED ORDER — INSULIN ASPART 100 UNIT/ML ~~LOC~~ SOLN
0.0000 [IU] | Freq: Three times a day (TID) | SUBCUTANEOUS | Status: DC
  Administered 2016-03-25: 6 [IU] via SUBCUTANEOUS
  Administered 2016-03-25 – 2016-03-26 (×4): 5 [IU] via SUBCUTANEOUS
  Administered 2016-03-26: 2 [IU] via SUBCUTANEOUS
  Administered 2016-03-27 (×3): 3 [IU] via SUBCUTANEOUS
  Filled 2016-03-24: qty 3
  Filled 2016-03-24: qty 5
  Filled 2016-03-24: qty 2
  Filled 2016-03-24: qty 5
  Filled 2016-03-24: qty 3
  Filled 2016-03-24: qty 5
  Filled 2016-03-24: qty 3
  Filled 2016-03-24: qty 2
  Filled 2016-03-24: qty 5

## 2016-03-24 MED ORDER — CHLORHEXIDINE GLUCONATE 0.12 % MT SOLN
15.0000 mL | Freq: Two times a day (BID) | OROMUCOSAL | Status: DC
  Administered 2016-03-24 – 2016-03-28 (×7): 15 mL via OROMUCOSAL
  Filled 2016-03-24 (×5): qty 15

## 2016-03-24 MED ORDER — GLIPIZIDE ER 2.5 MG PO TB24
2.5000 mg | ORAL_TABLET | Freq: Every day | ORAL | Status: DC
  Filled 2016-03-24: qty 1

## 2016-03-24 MED ORDER — SODIUM CHLORIDE 0.9 % IV SOLN: 250.0000 mL | INTRAVENOUS | Status: DC | PRN

## 2016-03-24 MED ORDER — ENALAPRIL MALEATE 10 MG PO TABS
20.0000 mg | ORAL_TABLET | Freq: Two times a day (BID) | ORAL | Status: DC
  Administered 2016-03-24 – 2016-03-28 (×8): 20 mg via ORAL
  Filled 2016-03-24 (×2): qty 2
  Filled 2016-03-24: qty 1
  Filled 2016-03-24 (×6): qty 2

## 2016-03-24 MED ORDER — TIZANIDINE HCL 4 MG PO TABS
4.0000 mg | ORAL_TABLET | Freq: Three times a day (TID) | ORAL | Status: DC | PRN
  Administered 2016-03-25 – 2016-03-28 (×7): 4 mg via ORAL
  Filled 2016-03-24 (×8): qty 1

## 2016-03-24 MED ORDER — DEXTROSE 5 % IV SOLN: 1.0000 g | INTRAVENOUS | Status: DC

## 2016-03-24 MED ORDER — FUROSEMIDE 10 MG/ML IJ SOLN
INTRAMUSCULAR | Status: AC
  Filled 2016-03-24: qty 10

## 2016-03-24 MED ORDER — INSULIN ASPART 100 UNIT/ML ~~LOC~~ SOLN
0.0000 [IU] | Freq: Every day | SUBCUTANEOUS | Status: DC
  Administered 2016-03-24 – 2016-03-25 (×2): 2 [IU] via SUBCUTANEOUS
  Administered 2016-03-26: 5 [IU] via SUBCUTANEOUS
  Filled 2016-03-24: qty 5
  Filled 2016-03-24: qty 2

## 2016-03-24 MED ORDER — HEPARIN SODIUM (PORCINE) 5000 UNIT/ML IJ SOLN: 5000.0000 [IU] | Freq: Three times a day (TID) | INTRAMUSCULAR | Status: DC

## 2016-03-24 MED ORDER — CEFTRIAXONE SODIUM-DEXTROSE 1-3.74 GM-% IV SOLR
1.0000 g | INTRAVENOUS | Status: DC
  Administered 2016-03-25 – 2016-03-27 (×3): 1 g via INTRAVENOUS
  Filled 2016-03-24 (×3): qty 50

## 2016-03-24 MED ORDER — ALBUTEROL SULFATE (2.5 MG/3ML) 0.083% IN NEBU: 3.0000 mL | INHALATION_SOLUTION | RESPIRATORY_TRACT | Status: DC | PRN

## 2016-03-24 MED ORDER — METHYLPREDNISOLONE SODIUM SUCC 40 MG IJ SOLR
40.0000 mg | Freq: Two times a day (BID) | INTRAMUSCULAR | Status: DC
  Administered 2016-03-24 – 2016-03-27 (×6): 40 mg via INTRAVENOUS
  Filled 2016-03-24 (×6): qty 1

## 2016-03-24 MED ORDER — IPRATROPIUM-ALBUTEROL 0.5-2.5 (3) MG/3ML IN SOLN
3.0000 mL | RESPIRATORY_TRACT | Status: DC
Start: 2016-03-24 — End: 2016-03-24

## 2016-03-24 NOTE — ED Notes (Signed)
Pt resting, family at bedside.  

## 2016-03-24 NOTE — ED Provider Notes (Signed)
[facility] Emergency Department Provider Note  ____________________________________________  Time seen: Approximately 11:46 AM  I have reviewed the triage vital signs and the nursing notes.   HISTORY  Chief Complaint Shortness of Breath   HPI Mckenzie Taylor is a 76 y.o. female history of CHF, COPD, diabetes, hypertension who presents for evaluation of shortness of breath. Patient reports progressively worsening shortness of breath for the last few days requiring use of her albuterol inhaler. This morning the shortness of breath got markedly worse. She's been coughing and producing yellow sputum. No fever or chills, no chest pain. Patient is supposed to be on Lasix as needed. Her last echocardiogram wasin 2010 with normal EF. She does not have a cardiologist. She hasn't been taking Lasix for the last 2-3 days. She is also noticed swelling of her lower extremities. Her shortness of breath was worse with exertion. She denies orthopnea. She is only on oxygen at home and nighttime. This morning when EMS arrived she was satting in the 60s and was placed on CPAP. She had diffuse wheezing and was given 125 mg of Solu-Medrol and 1 DuoNeb treatment in route. She reports that her shortness of breath improved a little bit with the DuoNeb treatment.  Past Medical History:  Diagnosis Date  . Autosomal recessive polycystic kidneys   . Avascular necrosis of hip (HCC)    bilateral  . CAD (coronary artery disease)    mild  . CHF (congestive heart failure) ([place])   . Chronic kidney disease    cyst  . COPD (chronic obstructive pulmonary disease) ([place])   . Diabetes type 2, controlled ([place])   . GERD (gastroesophageal reflux disease)   . HTN (hypertension)   . Hypothyroidism    subclinical. low TSH, normal thyroid panel. biopsy 2010  . Vitamin B12 deficiency     Patient Active Problem List   Diagnosis Date Noted  . 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 TOBACCO  ABUSE 12/17/2008  . Coronary atherosclerosis of native coronary artery 12/17/2008  . 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 CLAUDICATION, INTERMITTENT 12/17/2008    Past Surgical History:  Procedure Laterality Date  . CHOLECYSTECTOMY    . hip replacement     bilateral-secondary to avascular necrosis  . right eye lens replacement    . ULNAR NERVE REPAIR     bilateral  . VESICOVAGINAL FISTULA CLOSURE W/ TAH      Prior to Admission medications   Medication Sig Start Date End Date Taking? Authorizing Provider  albuterol (PROVENTIL) (2.5 MG/3ML) 0.083% nebulizer solution Take by nebulization every 4 (four) hours as needed for wheezing or shortness of breath.   Yes Historical Provider, MD  amLODipine (NORVASC) 10 MG tablet Take 10 mg by mouth daily.     Yes Historical Provider, MD  aspirin 81 MG EC tablet Take 81 mg by mouth daily.     Yes Historical Provider, MD  atorvastatin (LIPITOR) 40 MG tablet Take 40 mg by mouth daily.   Yes Historical Provider, MD  enalapril (VASOTEC) 20 MG tablet Take 20 mg by mouth 2 (two) times daily.     Yes Historical Provider, MD  FLUoxetine (PROZAC) 40 MG capsule Take 40 mg by mouth.   Yes Historical Provider, MD  gabapentin (NEURONTIN) 100 MG capsule Take 200 mg by mouth 3 (three) times daily.   Yes Historical Provider, MD  glipiZIDE (GLUCOTROL XL) 2.5 MG 24 hr tablet Take 2.5 mg by mouth daily with breakfast.  Yes Historical Provider, MD  omeprazole (PRILOSEC) 20 MG capsule Take 20 mg by mouth daily.   Yes Historical Provider, MD  tiZANidine (ZANAFLEX) 4 MG capsule Take 4 mg by mouth 3 (three) times daily as needed for muscle spasms.   Yes Historical Provider, MD  umeclidinium bromide (INCRUSE ELLIPTA) 62.5 MCG/INH AEPB Inhale 1 puff into the lungs daily.   Yes Historical Provider, MD  albuterol (VENTOLIN HFA) 108 (90 BASE) MCG/ACT inhaler Inhale 2 puffs into the lungs every 6 (six) hours as needed.      Historical Provider, MD    ibuprofen (ADVIL,MOTRIN) 800 MG tablet Take 800 mg by mouth every 8 (eight) hours as needed.      Historical Provider, MD    Allergies Patient has no known allergies.  Family History  Problem Relation Age of Onset  . Cancer Father     lung  . COPD Mother   . Heart disease Mother   . Heart disease Maternal Uncle     Social History Social History  Substance Use Topics  . Smoking status: Former Smoker    Packs/day: 2.00  . Smokeless tobacco: Never Used     Comment: 1 ppd - 50 years   . Alcohol use No    Review of Systems  Constitutional: Negative for fever. Eyes: Negative for visual changes. ENT: Negative for sore throat. Neck: No neck pain  Cardiovascular: Negative for chest pain. Respiratory: + shortness of breath and cough Gastrointestinal: Negative for abdominal pain, vomiting or diarrhea. Genitourinary: Negative for dysuria. Musculoskeletal: Negative for back pain. + b/l LE edema Skin: Negative for rash. Neurological: Negative for headaches, weakness or numbness. Psych: No SI or HI  ____________________________________________   PHYSICAL EXAM:  VITAL SIGNS: Vitals:   03/24/16 1300 03/24/16 1315  BP: (!) 143/82   Pulse: (!) 115 (!) 113  Resp: (!) 23 (!) 24  Temp:     Constitutional: Alert and oriented, in moderate respiratory distress. HEENT:      Head: Normocephalic and atraumatic.         Eyes: Conjunctivae are normal. Sclera is non-icteric. EOMI. PERRL      Mouth/Throat: Mucous membranes are moist.       Neck: Supple with no signs of meningismus. Cardiovascular: Tachycardic with regular rhythm. No murmurs, gallops, or rubs. 2+ symmetrical distal pulses are present in all extremities. No JVD. Respiratory: Increased work of breathing, hypoxic, tachypneic, decreased air movement bilaterally with faint expiratory wheezes, no crackles  Gastrointestinal: Soft, non tender, and non distended with positive bowel sounds. No rebound or  guarding. Musculoskeletal: 2+ pitting edema of b/l  Neurologic: Normal speech and language. Face is symmetric. Moving all extremities. No gross focal neurologic deficits are appreciated. Skin: Skin is warm, dry and intact. No rash noted. Psychiatric: Mood and affect are normal. Speech and behavior are normal.  ____________________________________________   LABS (all labs ordered are listed, but only abnormal results are displayed)  Labs Reviewed  CBC WITH DIFFERENTIAL/PLATELET - Abnormal; Notable for the following:       Result Value   WBC 17.2 (*)    RBC 3.55 (*)    Hemoglobin 9.6 (*)    HCT 30.8 (*)    MCHC 31.2 (*)    RDW 18.3 (*)    Neutro Abs 15.9 (*)    Lymphs Abs 0.4 (*)    All other components within normal limits  BASIC METABOLIC PANEL - Abnormal; Notable for the following:    Glucose, Bld 217 (*)  Creatinine, Ser 1.48 (*)    Calcium 8.6 (*)    GFR calc non Af Amer 34 (*)    GFR calc Af Amer 39 (*)    All other components within normal limits  BRAIN NATRIURETIC PEPTIDE - Abnormal; Notable for the following:    B Natriuretic Peptide 174.0 (*)    All other components within normal limits  BLOOD GAS, VENOUS - Abnormal; Notable for the following:    pO2, Ven 95.0 (*)    All other components within normal limits  TROPONIN I  TSH  INFLUENZA PANEL BY PCR (TYPE A & B, H1N1)   ____________________________________________  EKG  ED ECG REPORT I, Rudene Re, the attending physician, personally viewed and interpreted this ECG.  Sinus tachycardia, rate of 121, prolonged QTC at 505, normal axis, no ST elevations or depressions. Unchanged from prior ____________________________________________  RADIOLOGY  CXR: 1. Confluent abnormal right lung base opacity suspicious for pneumonia and/or aspiration. Possible small right pleural effusion. 2. Cardiomegaly appears increased since 2015. Increased pulmonary vascular congestion but no overt edema  suspected. ____________________________________________   PROCEDURES  Procedure(s) performed: None Procedures Critical Care performed: yes  CRITICAL CARE Performed by: Rudene Re  ?  Total critical care time: 35 min  Critical care time was exclusive of separately billable procedures and treating other patients.  Critical care was necessary to treat or prevent imminent or life-threatening deterioration.  Critical care was time spent personally by me on the following activities: development of treatment plan with patient and/or surrogate as well as nursing, discussions with consultants, evaluation of patient's response to treatment, examination of patient, obtaining history from patient or surrogate, ordering and performing treatments and interventions, ordering and review of laboratory studies, ordering and review of radiographic studies, pulse oximetry and re-evaluation of patient's condition.  ____________________________________________   INITIAL IMPRESSION / ASSESSMENT AND PLAN / ED COURSE  76 y.o. female history of CHF, COPD, diabetes, hypertension who presents for evaluation of shortness of breath and found to have acute hypoxic respiratory failure from possible COPD and CHF exacerbation with decreased air movement bilaterally, productive cough, wheezing, but also worsening pitting edema. Patient was placed on BiPAP immediately with continuous albuterol and ipratropium. She'll get chest x-ray, EKG, labs. BP WNL.   Clinical Course as of Mar 24 1354  Wed Mar 24, 2016  1201 Chest x-ray concerning for pneumonia and pulmonary edema. Patient will be given ceftriaxone, azithromycin, and Lasix.  [CV]  3419 Patient continues to be on BiPAP. Will discuss with ICU.  [CV]    Clinical Course User Index [CV] Rudene Re, MD    Pertinent labs & imaging results that were available during my care of the patient were reviewed by me and considered in my medical decision making  (see chart for details).    ____________________________________________   FINAL CLINICAL IMPRESSION(S) / ED DIAGNOSES  Final diagnoses:  Acute respiratory failure with hypoxia (HCC)  COPD exacerbation ([place])  Community acquired pneumonia, unspecified laterality  Acute on chronic congestive heart failure, unspecified congestive heart failure type ([place])      NEW MEDICATIONS STARTED DURING THIS VISIT:  New Prescriptions   No medications on file     Note:  This document was prepared using Dragon voice recognition software and may include unintentional dictation errors.    Rudene Re, MD 03/24/16 1356

## 2016-03-24 NOTE — ED Notes (Signed)
Pt ate dinner meal.  Pt on bipap waiting on admission.  Pt alert.  Family with pt.  Sinus on monitor

## 2016-03-24 NOTE — Progress Notes (Signed)
Pt remains on 100% Bipap no signs of acute distress, vss resting comfortably in bed no complaints at this time.  Marda Stalker, [place] Pager [phone] (please enter 7 digits) PCCM Consult Pager [phone] (please enter 7 digits)

## 2016-03-24 NOTE — Progress Notes (Signed)
Patient arrived to ICU-15 from ED. Will continue to monitor.

## 2016-03-24 NOTE — ED Notes (Signed)
Oxygen sats up to 98%  On bipap.   Sinus tach on monitor.

## 2016-03-24 NOTE — ED Notes (Signed)
Oxygen sats dropping again 89-905 on cannula.  Pt placed by onto bipap.  Pt alert.  Family with pt.  Skin warm and dry.  meds infusing.

## 2016-03-24 NOTE — ED Notes (Signed)
fsbs 251

## 2016-03-24 NOTE — Progress Notes (Signed)
Pharmacy Antibiotic Note  Mckenzie Taylor is a 76 y.o. female admitted on 03/24/2016 with CAP.  Pharmacy has been consulted for Ceftriaxone dosing. CXR concerning for pneumonia and/or aspiration.   Plan: Continue Ceftriaxone 1g Iv Q24hr. Patient also receiving azithromycin 500mg  PO daily.   Procalcitonin ordered.   Height: 5\' 5"  (165.1 cm) Weight: 209 lb (94.8 kg) IBW/kg (Calculated) : 57  Temp (24hrs), Avg:97.8 F (36.6 C), Min:97.8 F (36.6 C), Max:97.8 F (36.6 C)   Recent Labs Lab 03/24/16 1220  WBC 17.2*  CREATININE 1.48*    Estimated Creatinine Clearance: 38 mL/min (by C-G formula based on SCr of 1.48 mg/dL (H)).    No Known Allergies  Antimicrobials this admission: CTX 1/10 >> Azithromycin 1/10 >>   Microbiology results: 1/10 rapid influenza: negative   Thank you for allowing pharmacy to be a part of this patient's care.  Loree Fee, PharmD 03/24/2016 4:29 PM

## 2016-03-24 NOTE — ED Notes (Signed)
Report called to taylor rn ccu nurse.

## 2016-03-24 NOTE — H&P (Signed)
PULMONARY / CRITICAL CARE MEDICINE   Name: Mckenzie Taylor MRN: [id number] DOB: 07/25/1940    ADMISSION DATE:  03/24/2016 CONSULTATION DATE:  03/24/2016  REFERRING MD:  Dr. Alfred Levins  CHIEF COMPLAINT:  Shortness of Breath   HISTORY OF PRESENT ILLNESS:   This is a 76 yo female with a PMH of COPD, Home O2 qhs at 5L via nasal canula, Former smoker, HTN, Chronic Kidney Disease, CAD, Autosomal recessive polycystic kidneys, GERD, Type II Diabetes Mellitus, CHF, Thyroid nodules, Hypothyroidism, Adrenal Mass, Avascular necrosis of hip,and Vitamin B12 deficiency.  She presented to [facility] ER 01/10 with c/o worsening shortness of breath onset 01/9.  She states she took 2 breathing treatments and used her rescue inhaler today due to worsening shortness of breath without relief of symptoms, therefore she notified EMS.  Upon EMS arrival her O2 sats were in the 60's and she was placed on CPAP.  Per ER notes she had diffuse wheezing and was given 125 mg Solumedrol and 1 Duoneb treatment by EMS en route to the ER.  In the ER pt was placed on Bipap with continuous albuterol and ipratropium administration.  The pt takes prn Lasix for lower extremity edema her last dose was 2 days prior to presentation to ER.  She endorses mild lower extremity edema and a productive cough with yellow sputum.   PCCM contacted to admit pt to ICU for acute on chronic hypoxic respiratory failure secondary to CAP and AECOPD.  PAST MEDICAL HISTORY :  She  has a past medical history of Autosomal recessive polycystic kidneys; Avascular necrosis of hip (HCC); CAD (coronary artery disease); CHF (congestive heart failure) ([place]); Chronic kidney disease; COPD (chronic obstructive pulmonary disease) ([place]); Diabetes type 2, controlled ([place]); GERD (gastroesophageal reflux disease); HTN (hypertension); Hypothyroidism; and Vitamin B12 deficiency.  PAST SURGICAL HISTORY: She  has a past surgical history that includes hip replacement; Vesicovaginal  fistula closure w/ TAH; Cholecystectomy; right eye lens replacement; and Ulnar nerve repair.  No Known Allergies  No current facility-administered medications on file prior to encounter.    Current Outpatient Prescriptions on File Prior to Encounter  Medication Sig  . amLODipine (NORVASC) 10 MG tablet Take 10 mg by mouth daily.    Marland Kitchen aspirin 81 MG EC tablet Take 81 mg by mouth daily.    . enalapril (VASOTEC) 20 MG tablet Take 20 mg by mouth 2 (two) times daily.    Marland Kitchen albuterol (VENTOLIN HFA) 108 (90 BASE) MCG/ACT inhaler Inhale 2 puffs into the lungs every 6 (six) hours as needed.    Marland Kitchen ibuprofen (ADVIL,MOTRIN) 800 MG tablet Take 800 mg by mouth every 8 (eight) hours as needed.      FAMILY HISTORY:  Her indicated that her mother is deceased. She indicated that her father is deceased. She indicated that the status of her maternal uncle is unknown.    SOCIAL HISTORY: She  reports that she has quit smoking. She smoked 2.00 packs per day. She has never used smokeless tobacco. She reports that she does not drink alcohol or use drugs.  REVIEW OF SYSTEMS:  Positives in BOLD Gen: Denies fever, chills, weight change, fatigue, night sweats HEENT: Denies blurred vision, double vision, hearing loss, tinnitus, sinus congestion, rhinorrhea, sore throat, neck stiffness, dysphagia PULM: shortness of breath, cough, sputum production, hemoptysis, wheezing CV: chest pain, edema, orthopnea, paroxysmal nocturnal dyspnea, palpitations GI: Denies abdominal pain, nausea, vomiting, diarrhea, hematochezia, melena, constipation, change in bowel habits GU: Denies dysuria, hematuria, polyuria, oliguria, urethral discharge Endocrine:  Denies hot or cold intolerance, polyuria, polyphagia or appetite change Derm: Denies rash, dry skin, scaling or peeling skin change Heme: Denies easy bruising, bleeding, bleeding gums Neuro: Denies headache, numbness, weakness, slurred speech, loss of memory or  consciousness   SUBJECTIVE:  Pt states breathing has improved on Bipap   VITAL SIGNS: BP 114/72   Pulse (!) 112   Temp 97.8 F (36.6 C) (Axillary)   Resp (!) 22   Ht 5\' 5"  (1.651 m)   Wt 94.8 kg (209 lb)   SpO2 91%   BMI 34.78 kg/m   HEMODYNAMICS:    VENTILATOR SETTINGS:    INTAKE / OUTPUT: No intake/output data recorded.  PHYSICAL EXAMINATION: General: Chronically ill appearing obese female Neuro:  Alert and oriented, follows commands, PERRLA HEENT:  Supple, no JVD Cardiovascular: Tachycardic, s1s2, no M/R/G Lungs: mild inspiratory wheezes and diminished throughout, even, non labored on Bipap Abdomen: +BS x4, soft, obese, non tender, non distended Musculoskeletal:  Trace edema bilateral lower extremities, normal tone  Skin:  Intact no rashes or lesions  LABS:  BMET  Recent Labs Lab 03/24/16 1220  NA 140  K 4.5  CL 107  CO2 23  BUN 12  CREATININE 1.48*  GLUCOSE 217*    Electrolytes  Recent Labs Lab 03/24/16 1220  CALCIUM 8.6*    CBC  Recent Labs Lab 03/24/16 1220  WBC 17.2*  HGB 9.6*  HCT 30.8*  PLT 346    Coag's No results for input(s): APTT, INR in the last 168 hours.  Sepsis Markers No results for input(s): LATICACIDVEN, PROCALCITON, O2SATVEN in the last 168 hours.  ABG No results for input(s): PHART, PCO2ART, PO2ART in the last 168 hours.  Liver Enzymes No results for input(s): AST, ALT, ALKPHOS, BILITOT, ALBUMIN in the last 168 hours.  Cardiac Enzymes  Recent Labs Lab 03/24/16 1220  TROPONINI <0.03    Glucose No results for input(s): GLUCAP in the last 168 hours.  Imaging Dg Chest Portable 1 View  Result Date: 03/24/2016 CLINICAL DATA:  76 year old female with increasing shortness of breath over night, pallor, respiratory distress. Bilateral lower extremity edema. Initial encounter. EXAM: PORTABLE CHEST 1 VIEW COMPARISON:  11/15/2013 and earlier. FINDINGS: Portable AP upright view at 1140 hours. The patient is  rotated to the right. Cardiomegaly appears increased since 2015. Other mediastinal contours are within normal limits. Visualized tracheal air column is within normal limits. Confluent abnormal right lung base opacity. No pneumothorax. Increased pulmonary vascularity, although no overt edema suspected in the left lung. Possible small right pleural effusion. No left pleural effusion. IMPRESSION: 1. Confluent abnormal right lung base opacity suspicious for pneumonia and/or aspiration. Possible small right pleural effusion. 2. Cardiomegaly appears increased since 2015. Increased pulmonary vascular congestion but no overt edema suspected. Electronically Signed   By: Genevie Ann M.D.   On: 03/24/2016 11:56   STUDIES:  None  CULTURES: Rapid Influenza 01/10>>negative   ANTIBIOTICS: Azithromycin 01/10>> Ceftriaxone 01/10>>  SIGNIFICANT EVENTS: 01/10-Pt admitted to [facility] ICU with acute on chronic hypoxic respiratory failure secondary to CAP and AECOPD  LINES/TUBES: PIV's>>  ASSESSMENT / PLAN:  PULMONARY A: Acute on chronic hypoxic respiratory failure secondary to CAP and AECOPD P:   Continuous Bipap for now wean as tolerated Maintain O2 sats 88% to 92% Continue bronchodilators IV steroids Repeat CXR in am Prn ABG's Pulmonary hygiene once off Bipap  CARDIOVASCULAR A:  No acute issues Hx: CHF, CAD, and HTN P:  Continue outpatient amlodipine, aspirin, atorvastatin, and enalapril Continuous telemetry  monitoring   RENAL A:   Acute on chronic renal failure  Hx: CKD and Autosomal recessive polycystic kidneys P:   Trend BMP's Replace electrolytes as indicated Monitor UOP  GASTROINTESTINAL A:   No acute issues Hx: GERD P:   Protonix for GERD Heart healthy diet once off Bipap  HEMATOLOGIC A:   Anemia Hx: Vitamin B12 deficiency P:  Lovenox for VTE prophylaxis Trend CBC Monitor for s/sx of bleeding Transfuse for hgb <7  INFECTIOUS A:   CAP Leukocytosis P:   Trend WBC and  monitor fever curve Trend PCT Check strep pneumoniae urinary antigen and legionella pneumophila serogp 1 ur ag Continue abx as listed above   ENDOCRINE A:   Type II Diabetes Mellitus  P:   CBG's ac/hs SSI  NEUROLOGIC A:   No acute issues  P:   Avoid sedating medications  Prn tylenol for pain   FAMILY  - Updates: Pts. family member updated about plan of care and questions answered 03/24/2016  - Inter-disciplinary family meet or Palliative Care meeting due by:  03/31/2016  Marda Stalker, [place] Pager [phone] (please enter 7 digits) PCCM Consult Pager [phone] (please enter 7 digits)  Pt was seen by me in the ED with ACNP Blak[place]y. This is acute on chronic resp failure due to AECOPD. Agree with above assessment and plan. I have reviewed and modified orders in detail  Merton Border, MD PCCM service Mobile [phone] Pager [phone] 03/25/2016

## 2016-03-24 NOTE — ED Notes (Signed)
Resumed care from collyn rn.  Pt alert.  Pt on 4 liters oxygen [place] with sats at 91-92%  Family with pt.  No chest pain.  Pt states I feel better.

## 2016-03-24 NOTE — ED Triage Notes (Signed)
Pt here via EMS from home with c/o sob worsening over night, appears pale, in mild distress, congested cough noted. Pt states she is on home O2 at night only. Converted to bipap and breathing/sats controlled. Mild edema bilateral lower extremities. No distress at this time

## 2016-03-25 ENCOUNTER — Inpatient Hospital Stay: Payer: Medicare Other

## 2016-03-25 LAB — CBC
HCT: 28 % — ABNORMAL LOW (ref 35.0–47.0)
Hemoglobin: 8.8 g/dL — ABNORMAL LOW (ref 12.0–16.0)
MCH: 27 pg (ref 26.0–34.0)
MCHC: 31.3 g/dL — ABNORMAL LOW (ref 32.0–36.0)
MCV: 86 fL (ref 80.0–100.0)
Platelets: 294 10*3/uL (ref 150–440)
RBC: 3.26 MIL/uL — ABNORMAL LOW (ref 3.80–5.20)
RDW: 17.4 % — ABNORMAL HIGH (ref 11.5–14.5)
WBC: 14.3 10*3/uL — ABNORMAL HIGH (ref 3.6–11.0)

## 2016-03-25 LAB — BASIC METABOLIC PANEL
Anion gap: 9 (ref 5–15)
BUN: 20 mg/dL (ref 6–20)
CO2: 24 mmol/L (ref 22–32)
Calcium: 8.8 mg/dL — ABNORMAL LOW (ref 8.9–10.3)
Chloride: 106 mmol/L (ref 101–111)
Creatinine, Ser: 1.3 mg/dL — ABNORMAL HIGH (ref 0.44–1.00)
GFR calc Af Amer: 46 mL/min — ABNORMAL LOW (ref >60)
GFR calc non Af Amer: 39 mL/min — ABNORMAL LOW (ref >60)
Glucose, Bld: 210 mg/dL — ABNORMAL HIGH (ref 65–99)
Potassium: 4.7 mmol/L (ref 3.5–5.1)
Sodium: 139 mmol/L (ref 135–145)

## 2016-03-25 LAB — GLUCOSE, CAPILLARY
Glucose-Capillary: 232 mg/dL — ABNORMAL HIGH (ref 65–99)
Glucose-Capillary: 201 mg/dL — ABNORMAL HIGH (ref 65–99)
Glucose-Capillary: 216 mg/dL — ABNORMAL HIGH (ref 65–99)
Glucose-Capillary: 150 mg/dL — ABNORMAL HIGH (ref 65–99)
Glucose-Capillary: 243 mg/dL — ABNORMAL HIGH (ref 65–99)

## 2016-03-25 LAB — STREP PNEUMONIAE URINARY ANTIGEN: Strep Pneumo Urinary Antigen: NEGATIVE

## 2016-03-25 LAB — PHOSPHORUS: Phosphorus: 3.8 mg/dL (ref 2.5–4.6)

## 2016-03-25 LAB — MAGNESIUM: Magnesium: 1.7 mg/dL (ref 1.7–2.4)

## 2016-03-25 LAB — MRSA PCR SCREENING: MRSA by PCR: NEGATIVE

## 2016-03-25 LAB — PROCALCITONIN: Procalcitonin: 1.11 ng/mL

## 2016-03-25 MED ORDER — MAGNESIUM SULFATE 2 GM/50ML IV SOLN
2.0000 g | Freq: Once | INTRAVENOUS | Status: AC
  Administered 2016-03-25: 2 g via INTRAVENOUS
  Filled 2016-03-25: qty 50

## 2016-03-25 MED ORDER — TRAMADOL HCL 50 MG PO TABS
25.0000 mg | ORAL_TABLET | Freq: Four times a day (QID) | ORAL | Status: DC | PRN
  Administered 2016-03-25 – 2016-03-28 (×5): 25 mg via ORAL
  Filled 2016-03-25 (×5): qty 1

## 2016-03-25 MED ORDER — INSULIN GLARGINE 100 UNIT/ML ~~LOC~~ SOLN
5.0000 [IU] | Freq: Every day | SUBCUTANEOUS | Status: DC
  Administered 2016-03-25: 5 [IU] via SUBCUTANEOUS
  Filled 2016-03-25 (×2): qty 0.05

## 2016-03-25 NOTE — Progress Notes (Addendum)
Patient off and on bipap during the day. Able to maintain saturations on 5L [place], patient did become SOB after being on [place] for extended period of time. Family and patient updated on plan of care. Patient resting currently with family at bedside. Patient changed to stepdown level of care per Hinton Dyer, NP. Wilnette Kales

## 2016-03-25 NOTE — Progress Notes (Signed)
Inpatient Diabetes Program Recommendations  AACE/ADA: New Consensus Statement on Inpatient Glycemic Control (2015)  Target Ranges:  Prepandial:   less than 140 mg/dL      Peak postprandial:   less than 180 mg/dL (1-2 hours)      Critically ill patients:  140 - 180 mg/dL   Lab Results  Component Value Date   GLUCAP 201 (H) 03/25/2016    Review of Glycemic Control  Results for [name], [name] (MRN [id number]) as of 03/25/2016 08:40  Ref. Range 03/24/2016 17:14 03/24/2016 18:58 03/24/2016 21:21 03/25/2016 07:29  Glucose-Capillary Latest Ref Range: 65 - 99 mg/dL 251 (H) 272 (H) 222 (H) 201 (H)    Diabetes history: Type 2 diabetes Outpatient Diabetes medications: Glipizide 2.5mg /day Current orders for Inpatient glycemic control: Glipizide 2.5mg /day, Novolog 0-15 units tid, Novolog 0-5 units qhs, Novolog 4 units tid with meals   * prednisone 40mg  q12h   Inpatient Diabetes Program Recommendations:  Consider d/c Glipizide while patient is inpatient.  Consider low dose basal insulin, Lantus 10 units qday (0.1unit/kg).   Per ADA recommendations "consider performing an A1C on all patients with diabetes or hyperglycemia admitted to the hospital if not performed in the prior 3 months".  Gentry Fitz, RN, BA, MHA, CDE Diabetes Coordinator Inpatient Diabetes Program  [phone] (Team Pager) [phone] ([place]) 03/25/2016 8:45 AM

## 2016-03-25 NOTE — Progress Notes (Signed)
Pharmacy Antibiotic Note  Mckenzie Taylor is a 76 y.o. female admitted on 03/24/2016 with CAP.  Pharmacy has been consulted for Ceftriaxone dosing. CXR concerning for pneumonia and/or aspiration.   Plan: Continue Ceftriaxone 1g Iv Q24hr. Patient also receiving azithromycin 500mg  PO daily.   Procalcitonin ordered.   Height: 5\' 5"  (165.1 cm) Weight: 216 lb 14.9 oz (98.4 kg) IBW/kg (Calculated) : 57  Temp (24hrs), Avg:98.1 F (36.7 C), Min:97.5 F (36.4 C), Max:98.7 F (37.1 C)   Recent Labs Lab 03/24/16 1220 03/25/16 0615  WBC 17.2* 14.3*  CREATININE 1.48* 1.30*    Estimated Creatinine Clearance: 44.1 mL/min (by C-G formula based on SCr of 1.3 mg/dL (H)).    No Known Allergies  Antimicrobials this admission: CTX 1/10 >> Azithromycin 1/10 >>   Microbiology results: 1/10 rapid influenza: negative   Thank you for allowing pharmacy to be a part of this patient's care.  Loree Fee, PharmD 03/25/2016 2:27 PM

## 2016-03-26 ENCOUNTER — Inpatient Hospital Stay: Payer: Medicare Other

## 2016-03-26 DIAGNOSIS — J181 Lobar pneumonia, unspecified organism: Secondary | ICD-10-CM

## 2016-03-26 DIAGNOSIS — J9621 Acute and chronic respiratory failure with hypoxia: Secondary | ICD-10-CM

## 2016-03-26 DIAGNOSIS — J9622 Acute and chronic respiratory failure with hypercapnia: Secondary | ICD-10-CM

## 2016-03-26 LAB — CBC WITH DIFFERENTIAL/PLATELET
Basophils Absolute: 0.1 10*3/uL (ref 0–0.1)
Basophils Relative: 0 %
Eosinophils Absolute: 0 10*3/uL (ref 0–0.7)
Eosinophils Relative: 0 %
HCT: 28.2 % — ABNORMAL LOW (ref 35.0–47.0)
Hemoglobin: 9 g/dL — ABNORMAL LOW (ref 12.0–16.0)
Lymphocytes Relative: 4 %
Lymphs Abs: 0.5 10*3/uL — ABNORMAL LOW (ref 1.0–3.6)
MCH: 27.7 pg (ref 26.0–34.0)
MCHC: 32 g/dL (ref 32.0–36.0)
MCV: 86.5 fL (ref 80.0–100.0)
Monocytes Absolute: 0.5 10*3/uL (ref 0.2–0.9)
Monocytes Relative: 3 %
Neutro Abs: 13.4 10*3/uL — ABNORMAL HIGH (ref 1.4–6.5)
Neutrophils Relative %: 93 %
Platelets: 333 10*3/uL (ref 150–440)
RBC: 3.26 MIL/uL — ABNORMAL LOW (ref 3.80–5.20)
RDW: 18.1 % — ABNORMAL HIGH (ref 11.5–14.5)
WBC: 14.4 10*3/uL — ABNORMAL HIGH (ref 3.6–11.0)

## 2016-03-26 LAB — BLOOD GAS, VENOUS
Acid-base deficit: 0.4 mmol/L (ref 0.0–2.0)
Bicarbonate: 26.6 mmol/L (ref 20.0–28.0)
O2 Saturation: 96.6 %
Patient temperature: 37
pCO2, Ven: 54 mmHg (ref 44.0–60.0)
pH, Ven: 7.3 (ref 7.250–7.430)
pO2, Ven: 95 mmHg — ABNORMAL HIGH (ref 32.0–45.0)

## 2016-03-26 LAB — GLUCOSE, CAPILLARY
Glucose-Capillary: 206 mg/dL — ABNORMAL HIGH (ref 65–99)
Glucose-Capillary: 211 mg/dL — ABNORMAL HIGH (ref 65–99)
Glucose-Capillary: 143 mg/dL — ABNORMAL HIGH (ref 65–99)
Glucose-Capillary: 210 mg/dL — ABNORMAL HIGH (ref 65–99)

## 2016-03-26 LAB — BASIC METABOLIC PANEL
Anion gap: 7 (ref 5–15)
BUN: 31 mg/dL — ABNORMAL HIGH (ref 6–20)
CO2: 26 mmol/L (ref 22–32)
Calcium: 8.8 mg/dL — ABNORMAL LOW (ref 8.9–10.3)
Chloride: 107 mmol/L (ref 101–111)
Creatinine, Ser: 1.35 mg/dL — ABNORMAL HIGH (ref 0.44–1.00)
GFR calc Af Amer: 44 mL/min — ABNORMAL LOW (ref >60)
GFR calc non Af Amer: 38 mL/min — ABNORMAL LOW (ref >60)
Glucose, Bld: 139 mg/dL — ABNORMAL HIGH (ref 65–99)
Potassium: 5.1 mmol/L (ref 3.5–5.1)
Sodium: 140 mmol/L (ref 135–145)

## 2016-03-26 LAB — PROCALCITONIN: Procalcitonin: 0.89 ng/mL

## 2016-03-26 MED ORDER — INSULIN GLARGINE 100 UNIT/ML ~~LOC~~ SOLN
15.0000 [IU] | Freq: Every day | SUBCUTANEOUS | Status: DC
  Administered 2016-03-26 – 2016-03-27 (×2): 15 [IU] via SUBCUTANEOUS
  Filled 2016-03-26 (×4): qty 0.15

## 2016-03-26 NOTE — Progress Notes (Signed)
Patient stated she felt much better, resp unlabored.  Sats WNL. Bipap remains at 35%

## 2016-03-26 NOTE — Evaluation (Signed)
Physical Therapy Evaluation Patient Details Name: Mckenzie Taylor MRN: [id number] DOB: [birth year] Today's Date: 03/26/2016   History of Present Illness  Pt is a 76 yo female with a PMH of COPD, Home O2 qhs at 5L via nasal canula, Former smoker, HTN, Chronic Kidney Disease, CAD, Autosomal recessive polycystic kidneys, GERD, Type II Diabetes Mellitus, CHF, Thyroid nodules, Hypothyroidism, Adrenal Mass, Avascular necrosis of hip,and Vitamin B12 deficiency.  She presented to [facility] ER 01/10 with c/o worsening shortness of breath onset 01/9.  She states she took 2 breathing treatments and used her rescue inhaler today due to worsening shortness of breath without relief of symptoms, therefore she notified EMS.  Upon EMS arrival her O2 sats were in the 60's and she was placed on CPAP.  Per ER notes she had diffuse wheezing and was given 125 mg Solumedrol and 1 Duoneb treatment by EMS en route to the ER.  In the ER pt was placed on Bipap with continuous albuterol and ipratropium administration.  The pt takes prn Lasix for lower extremity edema her last dose was 2 days prior to presentation to ER.  She endorses mild lower extremity edema and a productive cough with yellow sputum.  CXR (01/11): cardiomyopathy, ? Widening of superior mediastinum, RLL volume loss and consolidation, acute on chronic resp failure, COPD exacerbation, suspected RLL PNA.    Clinical Impression  Pt presents with deficits in strength, transfers, gait, balance, and activity tolerance.  Pt Mod I with bed mobility with use of bed rail with slightly increased time/effort required.  Baseline SpO2 94% on 6LO2/min and HR 105 bpm.   After bed mobility and while in sitting at EOB SpO2 91% and HR 112 bpm.  Pt stood at EOB with CGA and RW and performed marching and limited amb forward/backward and side-stepping with SpO2 89% and HR 112.  At this point pt's telemetry alarm went off for V-tach with pt in no distress.  Nsg notified and pt returned to  supine again in no distress with nurse stating alarm likely secondary to artifact.  Nurse requested pt be lowered to 4LO2/min with SpO2 remaining >/= 91% during supine therex.  Pt will benefit from PT services to address above deficits for decreased caregiver assistance upon discharge.      Follow Up Recommendations Home health PT     Equipment Recommendations  None recommended by PT    Recommendations for Other Services       Precautions / Restrictions Precautions Precautions: Fall Restrictions Weight Bearing Restrictions: No      Mobility  Bed Mobility Overal bed mobility: Independent                Transfers Overall transfer level: Needs assistance Equipment used: Rolling walker (2 wheeled) Transfers: Sit to/from Stand Sit to Stand: Supervision         General transfer comment: Pt able to stand confindently and with little effort  Ambulation/Gait Ambulation/Gait assistance: Min guard Ambulation Distance (Feet): 4 Feet Assistive device: Rolling walker (2 wheeled) Gait Pattern/deviations: Decreased step length - right;Decreased step length - left   Gait velocity interpretation: Below normal speed for age/gender General Gait Details: Pt steady with amb at EOB  Stairs Stairs:  (deferred)          Wheelchair Mobility    Modified Rankin (Stroke Patients Only)       Balance Overall balance assessment: Needs assistance Sitting-balance support: No upper extremity supported Sitting balance-Leahy Scale: Good     Standing balance support:  Bilateral upper extremity supported Standing balance-Leahy Scale: Good                               Pertinent Vitals/Pain Pain Assessment: No/denies pain    Home Living Family/patient expects to be discharged to:: Private residence Living Arrangements: Non-relatives/Friends Available Help at Discharge: Friend(s);Available 24 hours/day;Available PRN/intermittently (A friend who is "almost like an  adopted daughter" avialable 24/7, ex spouse available intermittently) Type of Home: House Home Access: Stairs to enter Entrance Stairs-Rails: Left (Post) Entrance Stairs-Number of Steps: 1 (Small threshold) Home Layout: One level Home Equipment: Cane - single point;Walker - 2 wheels      Prior Function Level of Independence: Independent with assistive device(s)         Comments: Ind with Amb without AD in home and with SPC in community, no fall history, uses 5LO2/min at night only     Hand Dominance   Dominant Hand: Right    Extremity/Trunk Assessment   Upper Extremity Assessment Upper Extremity Assessment: Overall WFL for tasks assessed    Lower Extremity Assessment Lower Extremity Assessment: Generalized weakness       Communication   Communication: No difficulties  Cognition Arousal/Alertness: Awake/alert Behavior During Therapy: WFL for tasks assessed/performed Overall Cognitive Status: Within Functional Limits for tasks assessed                      General Comments      Exercises Total Joint Exercises Ankle Circles/Pumps: Strengthening;10 reps;Both;15 reps Quad Sets: AROM;Both;10 reps Gluteal Sets: AROM;Both;10 reps Heel Slides: Both;10 reps;AAROM Hip ABduction/ADduction: AAROM;Both;10 reps Straight Leg Raises: AAROM;Both;10 reps Marching in Standing: AROM;Both;10 reps   Assessment/Plan    PT Assessment Patient needs continued PT services  PT Problem List Decreased strength;Decreased activity tolerance;Decreased balance          PT Treatment Interventions DME instruction;Gait training;Stair training;Therapeutic activities;Therapeutic exercise;Balance training;Neuromuscular re-education;Patient/family education    PT Goals (Current goals can be found in the Care Plan section)  Acute Rehab PT Goals Patient Stated Goal: To get back home PT Goal Formulation: With patient Time For Goal Achievement: 04/08/16 Potential to Achieve Goals:  Good    Frequency Min 2X/week   Barriers to discharge        Co-evaluation               End of Session Equipment Utilized During Treatment: Gait belt;Oxygen Activity Tolerance: Patient tolerated treatment well Patient left: in bed;with call bell/phone within reach;with nursing/sitter in room;Other (comment) (CNA entering to give pt a bath) Nurse Communication: Mobility status         Time: 0768-0881 PT Time Calculation (min) (ACUTE ONLY): 33 min   Charges:   PT Evaluation $PT Eval Moderate Complexity: 1 Procedure PT Treatments $Therapeutic Exercise: 8-22 mins   PT G Codes:        DRoyetta Asal PT, DPT 03/26/16, 4:42 PM

## 2016-03-26 NOTE — Progress Notes (Signed)
Patient required bipap 3 times this morning, but has been able to tolerate 4L [place] the rest of shift, with saturations of 90-93%. Patient worked with PT, was able to get up to chair with minimal assist, and is now using bsc without any problems. No complaints, vss. Will continue to assess. Wilnette Kales

## 2016-03-26 NOTE — Progress Notes (Signed)
Bipap taken off the give medications.  O2 @ 4L's applied.  Patient requesting to leave bipap off at this time.  Sats WNL.  Will monitor.

## 2016-03-26 NOTE — Progress Notes (Signed)
Remains largely dependent on BiPAP. No new complaints  Vitals:   03/26/16 0800 03/26/16 0842 03/26/16 1000 03/26/16 1054  BP: 133/72  133/70 (!) 141/75  Pulse: 77  80 96  Resp: 20  19 (!) 21  Temp: 97.8 F (36.6 C)     TempSrc: Oral     SpO2: 96% 92% 95% 92%  Weight:      Height:       Well supported on BiPAP HEENT WNL Coarse scattered wheezes Reg, no M NABS No edema  BMP Latest Ref Rng & Units 03/26/2016 03/25/2016 03/24/2016  Glucose 65 - 99 mg/dL 139(H) 210(H) 217(H)  BUN 6 - 20 mg/dL 31(H) 20 12  Creatinine 0.44 - 1.00 mg/dL 1.35(H) 1.30(H) 1.48(H)  Sodium 135 - 145 mmol/L 140 139 140  Potassium 3.5 - 5.1 mmol/L 5.1 4.7 4.5  Chloride 101 - 111 mmol/L 107 106 107  CO2 22 - 32 mmol/L 26 24 23   Calcium 8.9 - 10.3 mg/dL 8.8(L) 8.8(L) 8.6(L)   CBC Latest Ref Rng & Units 03/26/2016 03/25/2016 03/24/2016  WBC 3.6 - 11.0 K/uL 14.4(H) 14.3(H) 17.2(H)  Hemoglobin 12.0 - 16.0 g/dL 9.0(L) 8.8(L) 9.6(L)  Hematocrit 35.0 - 47.0 % 28.2(L) 28.0(L) 30.8(L)  Platelets 150 - 440 K/uL 333 294 346   PCT 0.26 > 1.11 . 0.89 Urine strep Ag negative Legionella urine antigen pending  CXR (01/11): cardiomyopathy, ? Widening of superior mediastinum, RLL volume loss and consolidation   IMPRESSION: 1) acute on chronic hypoxemic/hypercarbic respiratory failure 2) COPD with acute exacerbation 3) Suspected RLL PNA 4) Concern for superior mediastinum widening 5) DM II - inadequately controlled  PLAN/REC: 1) ont PRN BiPAP supplemental O2, systemic steroids, nebulized steroids and bronchodilators and empiric antibiotics 2) CXR this afternoon - consider CT chest depending on its appearance 3) Increase Lantus 4) Needs to remain in SDU until liberated fully from [place], MD PCCM service Mobile [phone] Pager [phone] 03/26/2016

## 2016-03-26 NOTE — Progress Notes (Signed)
Patient requesting Zanaflex and tramadol for legs.  Given as ordered.

## 2016-03-26 NOTE — Progress Notes (Signed)
Inpatient Diabetes Program Recommendations  AACE/ADA: New Consensus Statement on Inpatient Glycemic Control (2015)  Target Ranges:  Prepandial:   less than 140 mg/dL      Peak postprandial:   less than 180 mg/dL (1-2 hours)      Critically ill patients:  140 - 180 mg/dL   Lab Results  Component Value Date   GLUCAP 206 (H) 03/26/2016    Review of Glycemic Control  Results for NUPUR, HOHMAN (MRN [id number]) as of 03/26/2016 10:13  Ref. Range 03/25/2016 07:29 03/25/2016 11:08 03/25/2016 16:35 03/25/2016 21:19 03/26/2016 07:24  Glucose-Capillary Latest Ref Range: 65 - 99 mg/dL 201 (H) 216 (H) 150 (H) 243 (H) 206 (H)    Diabetes history: Type 2 diabetes Outpatient Diabetes medications: Glipizide 2.5mg /day Current orders for Inpatient glycemic control: Lantus 5 units qhs, Novolog 0-15 units tid, Novolog 0-5 units qhs, Novolog 4 units tid with meals   * prednisone 40mg  q12h   Inpatient Diabetes Program Recommendations:  Noted Lantus 5 units started last night- Fasting blood sugar 206mg /dl today-  Consider increasing Lantus 10 units qday (0.1unit/kg).  A1C pending  Gentry Fitz, RN, [place], [place], CDE Diabetes Coordinator Inpatient Diabetes Program  [phone] (Team Pager) [phone] ([place]) 03/26/2016 10:15 AM

## 2016-03-26 NOTE — Progress Notes (Signed)
Patient assisted to [facility].  After returning to bed, became SOB, resp labored, used abd muscles.  Bipap applied at 35%.

## 2016-03-26 NOTE — Progress Notes (Signed)
Pharmacy Antibiotic Note  Mckenzie Taylor is a 76 y.o. female admitted on 03/24/2016 with CAP.  Pharmacy has been consulted for Ceftriaxone dosing. CXR concerning for pneumonia and/or aspiration.   Plan: Continue Ceftriaxone 1g Iv Q24hr. Patient also receiving azithromycin 500mg  PO daily.   Procalcitonin ordered.   Height: 5\' 5"  (165.1 cm) Weight: 217 lb 9.5 oz (98.7 kg) IBW/kg (Calculated) : 57  Temp (24hrs), Avg:98.1 F (36.7 C), Min:97.8 F (36.6 C), Max:98.6 F (37 C)   Recent Labs Lab 03/24/16 1220 03/25/16 0615 03/26/16 0317  WBC 17.2* 14.3* 14.4*  CREATININE 1.48* 1.30* 1.35*    Estimated Creatinine Clearance: 42.5 mL/min (by C-G formula based on SCr of 1.35 mg/dL (H)).    No Known Allergies  Antimicrobials this admission: CTX 1/10 >> Azithromycin 1/10 >>   Microbiology results: 1/10 rapid influenza: negative   Thank you for allowing pharmacy to be a part of this patient's care.  Loree Fee, PharmD 03/26/2016 9:07 AM

## 2016-03-26 NOTE — Progress Notes (Signed)
Given report by [facility], resting on 35% Bipap. VSS, no issues at this time.

## 2016-03-27 LAB — COMPREHENSIVE METABOLIC PANEL
ALT: 11 U/L — ABNORMAL LOW (ref 14–54)
AST: 11 U/L — ABNORMAL LOW (ref 15–41)
Albumin: 3.5 g/dL (ref 3.5–5.0)
Alkaline Phosphatase: 44 U/L (ref 38–126)
Anion gap: 7 (ref 5–15)
BUN: 41 mg/dL — ABNORMAL HIGH (ref 6–20)
CO2: 27 mmol/L (ref 22–32)
Calcium: 8.8 mg/dL — ABNORMAL LOW (ref 8.9–10.3)
Chloride: 102 mmol/L (ref 101–111)
Creatinine, Ser: 1.28 mg/dL — ABNORMAL HIGH (ref 0.44–1.00)
GFR calc Af Amer: 47 mL/min — ABNORMAL LOW (ref >60)
GFR calc non Af Amer: 40 mL/min — ABNORMAL LOW (ref >60)
Glucose, Bld: 162 mg/dL — ABNORMAL HIGH (ref 65–99)
Potassium: 5 mmol/L (ref 3.5–5.1)
Sodium: 136 mmol/L (ref 135–145)
Total Bilirubin: 0.7 mg/dL (ref 0.3–1.2)
Total Protein: 7.1 g/dL (ref 6.5–8.1)

## 2016-03-27 LAB — LEGIONELLA PNEUMOPHILA SEROGP 1 UR AG
L. pneumophila Serogp 1 Ur Ag: NEGATIVE
L. pneumophila Serogp 1 Ur Ag: NEGATIVE

## 2016-03-27 LAB — GLUCOSE, CAPILLARY
Glucose-Capillary: 186 mg/dL — ABNORMAL HIGH (ref 65–99)
Glucose-Capillary: 187 mg/dL — ABNORMAL HIGH (ref 65–99)
Glucose-Capillary: 196 mg/dL — ABNORMAL HIGH (ref 65–99)
Glucose-Capillary: 133 mg/dL — ABNORMAL HIGH (ref 65–99)

## 2016-03-27 LAB — HEMOGLOBIN A1C
Hgb A1c MFr Bld: 6.7 % — ABNORMAL HIGH (ref 4.8–5.6)
Mean Plasma Glucose: 146 mg/dL

## 2016-03-27 LAB — CBC
HCT: 29.7 % — ABNORMAL LOW (ref 35.0–47.0)
Hemoglobin: 9.6 g/dL — ABNORMAL LOW (ref 12.0–16.0)
MCH: 27.4 pg (ref 26.0–34.0)
MCHC: 32.3 g/dL (ref 32.0–36.0)
MCV: 85 fL (ref 80.0–100.0)
Platelets: 340 10*3/uL (ref 150–440)
RBC: 3.49 MIL/uL — ABNORMAL LOW (ref 3.80–5.20)
RDW: 18.1 % — ABNORMAL HIGH (ref 11.5–14.5)
WBC: 11.9 10*3/uL — ABNORMAL HIGH (ref 3.6–11.0)

## 2016-03-27 MED ORDER — LEVOFLOXACIN 750 MG PO TABS
750.0000 mg | ORAL_TABLET | ORAL | Status: DC
  Administered 2016-03-28: 750 mg via ORAL
  Filled 2016-03-27: qty 1

## 2016-03-27 MED ORDER — LEVOFLOXACIN 500 MG PO TABS
500.0000 mg | ORAL_TABLET | Freq: Every day | ORAL | Status: DC
  Administered 2016-03-27: 500 mg via ORAL
  Filled 2016-03-27: qty 1

## 2016-03-27 MED ORDER — PREDNISONE 20 MG PO TABS
40.0000 mg | ORAL_TABLET | Freq: Every day | ORAL | Status: DC
  Administered 2016-03-28: 40 mg via ORAL
  Filled 2016-03-27: qty 2

## 2016-03-27 MED ORDER — BUDESONIDE 0.25 MG/2ML IN SUSP
0.2500 mg | Freq: Four times a day (QID) | RESPIRATORY_TRACT | Status: DC
  Administered 2016-03-27 – 2016-03-28 (×4): 0.25 mg via RESPIRATORY_TRACT
  Filled 2016-03-27 (×4): qty 2

## 2016-03-27 MED ORDER — IPRATROPIUM-ALBUTEROL 0.5-2.5 (3) MG/3ML IN SOLN
3.0000 mL | Freq: Four times a day (QID) | RESPIRATORY_TRACT | Status: DC
  Administered 2016-03-27 – 2016-03-28 (×4): 3 mL via RESPIRATORY_TRACT
  Filled 2016-03-27 (×4): qty 3

## 2016-03-27 NOTE — Progress Notes (Signed)
Renal dose adjustment:   Changed order for levofloxacin 500 mg PO daily to 750 mg PO q48h per indication and renal dose adjustment.   Lenis Noon, PharmD Clinical Pharmacist 03/27/16 2:11 PM

## 2016-03-27 NOTE — Progress Notes (Signed)
Pt left floor with NT to room 141. Pt did not want family notified, stated she would let them know later this afternoon.

## 2016-03-27 NOTE — Progress Notes (Addendum)
Subjective  On/off BiPAP overnight. No new complaints  Vitals:   03/27/16 0400 03/27/16 0500 03/27/16 0504 03/27/16 0600  BP: (!) 143/82 (!) 148/81  (!) 147/87  Pulse: 79 97  91  Resp: 19 (!) 23  (!) 26  Temp:      TempSrc:      SpO2: 100% 90%  93%  Weight:   97.1 kg (214 lb 1.1 oz)   Height:       EXAM  GEN: comfortable on BiPAP HEENT: PERRLA, neck is supple, no JVD Pulmonary: Normal WOB, bilateral breath sounds, diminished, Coarse scattered wheezes CV: RRR, s1/s2, no MRG Abdomen: soft, +BS, NT Neuro: AAO X3, no focal eficits Extremities: No edema  BMP Latest Ref Rng & Units 03/27/2016 03/26/2016 03/25/2016  Glucose 65 - 99 mg/dL 162(H) 139(H) 210(H)  BUN 6 - 20 mg/dL 41(H) 31(H) 20  Creatinine 0.44 - 1.00 mg/dL 1.28(H) 1.35(H) 1.30(H)  Sodium 135 - 145 mmol/L 136 140 139  Potassium 3.5 - 5.1 mmol/L 5.0 5.1 4.7  Chloride 101 - 111 mmol/L 102 107 106  CO2 22 - 32 mmol/L 27 26 24   Calcium 8.9 - 10.3 mg/dL 8.8(L) 8.8(L) 8.8(L)   CBC Latest Ref Rng & Units 03/27/2016 03/26/2016 03/25/2016  WBC 3.6 - 11.0 K/uL 11.9(H) 14.4(H) 14.3(H)  Hemoglobin 12.0 - 16.0 g/dL 9.6(L) 9.0(L) 8.8(L)  Hematocrit 35.0 - 47.0 % 29.7(L) 28.2(L) 28.0(L)  Platelets 150 - 440 K/uL 340 333 294   PCT 0.26 > 1.11 . 0.89 Urine strep Ag negative Legionella urine antigen pending  CXR (01/11): cardiomyopathy, ? Widening of superior mediastinum, RLL volume loss and consolidation  Dg Chest 1 View  Result Date: 03/26/2016 CLINICAL DATA:  COPD. EXAM: CHEST 1 VIEW COMPARISON:  03/25/2016. FINDINGS: Mediastinum and hilar structures normal. Cardiomegaly with normal pulmonary vascularity. Right lower lobe infiltrate consistent pneumonia. Mild basilar atelectasis. No prominent pleural effusion. No pneumothorax . IMPRESSION: 1.  Prominent right base infiltrate consistent pneumonia. 2.  Low lung volumes with basilar atelectasis. 3. Cardiomegaly.  No pulmonary venous congestion. Electronically Signed   By: Marcello Moores   Register   On: 03/26/2016 14:04    IMPRESSION: 1) acute on chronic hypoxemic/hypercarbic respiratory failure 2) COPD with acute exacerbation 3) RLL PNA 4) Concern for superior mediastinum widening 5) DM II - inadequately controlled  PLAN/REC: 1) Continue PRN BiPAP supplemental O2, systemic steroids, nebulized steroids and bronchodilators and empiric antibiotics 2) CXR dialy prn 3) Continue Lantus and sliding scale insulin coverage 4) Needs to remain in SDU until liberated fully from BiPAP   Plan of care discussed with Dr. Jonathon Jordan. [facility] ANP-BC Pulmonary and Critical Care Medicine [facility] Pager [phone] or [phone] 03/27/2016  PCCM ATTENDING ATTESTATION:  I have evaluated patient with the APP [place], reviewed database in its entirety and discussed care plan in detail.   Important exam findings: Less dyspneic, no distress at rest Minimal wheezes R basilar crackles Reg, no M NABS, soft No edema  CXR 01/12 afternoon: improving aeration RLL   Major problems addressed by PCCM team: Severe COPD (baseline 3-5 LPM [place]) with acute exacerbation RLL CAP, NOS Acute on chronic respiratory failure - improving   PLAN/REC: Transfer to med-surg. Hospitalist Service to assume her care as of 01/14 AM  Discussed with Dr Posey Pronto Cont nebulized steroids and bronchodilators for now Transition to PO steroids and taper to off over next 5 days or so Change abx to PO levofloxacin - complete 7 days Upon discharge, she should resume her  previous COPD regimen I have requested follow up in Pulmonary Clinic 4-6 weeks after discharge  Merton Border, MD PCCM service Mobile [phone] Pager [phone] 03/27/2016

## 2016-03-27 NOTE — Progress Notes (Signed)
Report called to [facility] on 1A. Pt to be transferred to room 141. Continues on 4L per . Tolerating well. Not as SOBE, tolerated BSC.

## 2016-03-28 LAB — GLUCOSE, CAPILLARY: Glucose-Capillary: 95 mg/dL (ref 65–99)

## 2016-03-28 MED ORDER — IPRATROPIUM-ALBUTEROL 0.5-2.5 (3) MG/3ML IN SOLN: 3.0000 mL | Freq: Four times a day (QID) | RESPIRATORY_TRACT | 0 refills | Status: DC

## 2016-03-28 MED ORDER — LEVOFLOXACIN 750 MG PO TABS: 750.0000 mg | ORAL_TABLET | ORAL | 0 refills | Status: AC

## 2016-03-28 MED ORDER — PREDNISONE 10 MG (21) PO TBPK: ORAL_TABLET | ORAL | 0 refills | Status: DC

## 2016-03-28 NOTE — Progress Notes (Signed)
Patient discharging home. VSS. Instructions and prescriptions given to Patient, verbalized understanding.

## 2016-03-28 NOTE — Care Management Note (Signed)
Case Management Note  Patient Details  Name: Mckenzie Taylor MRN: [id number] Date of Birth: 01/18/1941  Subjective/Objective:      Discussed discharge planning with [name]. She chose [facility] as her home health provider. 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 [name] is on chronic 5L N/C with [place].               Action/Plan:   Expected Discharge Date:  03/28/16               Expected Discharge Plan:     In-House Referral:     Discharge planning Services     Post Acute Care Choice:    Choice offered to:     DME Arranged:    DME Agency:     HH Arranged:    HH Agency:     Status of Service:     If discussed at [employer] [facility] [employer], dates discussed:    Additional Comments:  Corrine Tillis A, RN 03/28/2016, 8:23 AM

## 2016-03-28 NOTE — Discharge Summary (Signed)
Mckenzie Taylor at [place] NAME: Mckenzie Taylor    MR#:  [id number]  DATE OF BIRTH:  1940-10-03  DATE OF ADMISSION:  03/24/2016 ADMITTING PHYSICIAN: Wilhelmina Mcardle, MD  DATE OF DISCHARGE: 03/28/2016  PRIMARY CARE PHYSICIAN: Baltazar Apo, MD    ADMISSION DIAGNOSIS:  Acute respiratory failure (HCC) [J96.00] COPD exacerbation ([place]) [J44.1] Acute respiratory failure with hypoxia (HCC) [J96.01] Acute on chronic congestive heart failure, unspecified congestive heart failure type ([place]) [I50.9] Community acquired pneumonia, unspecified laterality [J18.9]  DISCHARGE DIAGNOSIS:  Active Problems:   Acute respiratory failure ([place])   COPD exacerbation  SECONDARY DIAGNOSIS:   Past Medical History:  Diagnosis Date  . Autosomal recessive polycystic kidneys   . Avascular necrosis of hip (HCC)    bilateral  . CAD (coronary artery disease)    mild  . CHF (congestive heart failure) ([place])   . Chronic kidney disease    cyst  . COPD (chronic obstructive pulmonary disease) ([place])   . Diabetes type 2, controlled ([place])   . GERD (gastroesophageal reflux disease)   . HTN (hypertension)   . Hypothyroidism    subclinical. low TSH, normal thyroid panel. biopsy 2010  . Vitamin B12 deficiency     HOSPITAL COURSE:   * ac on ch hypoxic respi failure    COPD exacerbation    RLL community acquired pneumonia     GIven IV steroids, Nebs, Abx.   Initially required Bipap, came off and on 3-4 ltr nasal canula- stable.    She already have home oxygen.   Advised to finish her course of oral Abx and steroids and follow with pulm clinic.  * Hx of CAD, CHF, Htn, GERD, CKD,  DM    All remains stable in this admission on home meds.  DISCHARGE CONDITIONS:   Stable.  CONSULTS OBTAINED:    DRUG ALLERGIES:  No Known Allergies  DISCHARGE MEDICATIONS:   Current Discharge Medication List    START taking these medications   Details   ipratropium-albuterol (DUONEB) 0.5-2.5 (3) MG/3ML SOLN Take 3 mLs by nebulization 4 (four) times daily. Qty: 360 mL, Refills: 0    levofloxacin (LEVAQUIN) 750 MG tablet Take 1 tablet (750 mg total) by mouth every other day. Qty: 2 tablet, Refills: 0    predniSONE (STERAPRED UNI-PAK 21 TAB) 10 MG (21) TBPK tablet Take 6 tabs first day, 5 tab on day 2, then 4 on day 3rd, 3 tabs on day 4th , 2 tab on day 5th, and 1 tab on 6th day. Qty: 21 tablet, Refills: 0      CONTINUE these medications which have NOT CHANGED   Details  albuterol (PROVENTIL) (2.5 MG/3ML) 0.083% nebulizer solution Take by nebulization every 4 (four) hours as needed for wheezing or shortness of breath.    amLODipine (NORVASC) 10 MG tablet Take 10 mg by mouth daily.      aspirin 81 MG EC tablet Take 81 mg by mouth daily.      atorvastatin (LIPITOR) 40 MG tablet Take 40 mg by mouth daily.    enalapril (VASOTEC) 20 MG tablet Take 20 mg by mouth 2 (two) times daily.      FLUoxetine (PROZAC) 40 MG capsule Take 40 mg by mouth.    gabapentin (NEURONTIN) 100 MG capsule Take 200 mg by mouth 3 (three) times daily.    glipiZIDE (GLUCOTROL XL) 2.5 MG 24 hr tablet Take 2.5 mg by mouth daily with breakfast.    omeprazole (PRILOSEC)  20 MG capsule Take 20 mg by mouth daily.    tiZANidine (ZANAFLEX) 4 MG capsule Take 4 mg by mouth 3 (three) times daily as needed for muscle spasms.    umeclidinium bromide (INCRUSE ELLIPTA) 62.5 MCG/INH AEPB Inhale 1 puff into the lungs daily.    albuterol (VENTOLIN HFA) 108 (90 BASE) MCG/ACT inhaler Inhale 2 puffs into the lungs every 6 (six) hours as needed.      ibuprofen (ADVIL,MOTRIN) 800 MG tablet Take 800 mg by mouth every 8 (eight) hours as needed.           DISCHARGE INSTRUCTIONS:    Follow with pulm clinic in 2 weeks.  If you experience worsening of your admission symptoms, develop shortness of breath, life threatening emergency, suicidal or homicidal thoughts you must seek  medical attention immediately by calling 911 or calling your MD immediately  if symptoms less severe.  You Must read complete instructions/literature along with all the possible adverse reactions/side effects for all the Medicines you take and that have been prescribed to you. Take any new Medicines after you have completely understood and accept all the possible adverse reactions/side effects.   Please note  You were cared for by a hospitalist during your hospital stay. If you have any questions about your discharge medications or the care you received while you were in the hospital after you are discharged, you can call the unit and asked to speak with the hospitalist on call if the hospitalist that took care of you is not available. Once you are discharged, your primary care physician will handle any further medical issues. Please note that NO REFILLS for any discharge medications will be authorized once you are discharged, as it is imperative that you return to your primary care physician (or establish a relationship with a primary care physician if you do not have one) for your aftercare needs so that they can reassess your need for medications and monitor your lab values.    Today   CHIEF COMPLAINT:   Chief Complaint  Patient presents with  . Shortness of Breath    HISTORY OF PRESENT ILLNESS:  Mckenzie Taylor  is a 76 y.o. female with a PMH of COPD, Home O2 qhs at 5L via nasal canula, Former smoker, HTN, Chronic Kidney Disease, CAD, Autosomal recessive polycystic kidneys, GERD, Type II Diabetes Mellitus, CHF, Thyroid nodules, Hypothyroidism, Adrenal Mass, Avascular necrosis of hip,and Vitamin B12 deficiency.  She presented to [facility] ER 01/10 with c/o worsening shortness of breath onset 01/9.  She states she took 2 breathing treatments and used her rescue inhaler today due to worsening shortness of breath without relief of symptoms, therefore she notified EMS.  Upon EMS arrival her O2 sats  were in the 60's and she was placed on CPAP.  Per ER notes she had diffuse wheezing and was given 125 mg Solumedrol and 1 Duoneb treatment by EMS en route to the ER.  In the ER pt was placed on Bipap with continuous albuterol and ipratropium administration.  The pt takes prn Lasix for lower extremity edema her last dose was 2 days prior to presentation to ER.  She endorses mild lower extremity edema and a productive cough with yellow sputum.   PCCM contacted to admit pt to ICU for acute on chronic hypoxic respiratory failure secondary to CAP and AECOPD.  VITAL SIGNS:  Blood pressure 127/82, pulse 98, temperature 97.9 F (36.6 C), temperature source Oral, resp. rate 20, height 5\' 5"  (1.651 m), weight  97.1 kg (214 lb 1.1 oz), SpO2 93 %.  I/O:   Intake/Output Summary (Last 24 hours) at 03/28/16 0755 Last data filed at 03/28/16 0729  Gross per 24 hour  Intake              650 ml  Output              750 ml  Net             -100 ml    PHYSICAL EXAMINATION:  GENERAL:  76 y.o.-year-old patient lying in the bed with no acute distress.  EYES: Pupils equal, round, reactive to light and accommodation. No scleral icterus. Extraocular muscles intact.  HEENT: Head atraumatic, normocephalic. Oropharynx and nasopharynx clear.  NECK:  Supple, no jugular venous distention. No thyroid enlargement, no tenderness.  LUNGS: Normal breath sounds bilaterally, some wheezing, no crepitation. No use of accessory muscles of respiration.  CARDIOVASCULAR: S1, S2 normal. No murmurs, rubs, or gallops.  ABDOMEN: Soft, non-tender, non-distended. Bowel sounds present. No organomegaly or mass.  EXTREMITIES: No pedal edema, cyanosis, or clubbing.  NEUROLOGIC: Cranial nerves II through XII are intact. Muscle strength 5/5 in all extremities. Sensation intact. Gait not checked.  PSYCHIATRIC: The patient is alert and oriented x 3.  SKIN: No obvious rash, lesion, or ulcer.   DATA REVIEW:   CBC  Recent Labs Lab 03/27/16 0447   WBC 11.9*  HGB 9.6*  HCT 29.7*  PLT 340    Chemistries   Recent Labs Lab 03/25/16 0615  03/27/16 0447  NA 139  < > 136  K 4.7  < > 5.0  CL 106  < > 102  CO2 24  < > 27  GLUCOSE 210*  < > 162*  BUN 20  < > 41*  CREATININE 1.30*  < > 1.28*  CALCIUM 8.8*  < > 8.8*  MG 1.7  --   --   AST  --   --  11*  ALT  --   --  11*  ALKPHOS  --   --  44  BILITOT  --   --  0.7  < > = values in this interval not displayed.  Cardiac Enzymes  Recent Labs Lab 03/24/16 1220  TROPONINI <0.03    Microbiology Results  Results for orders placed or performed during the hospital encounter of 03/24/16  Rapid Influenza A&B Antigens ([place] only)     Status: None   Collection Time: 03/24/16  3:00 PM  Result Value Ref Range Status   Influenza A ([place]) NEGATIVE NEGATIVE Final   Influenza B ([facility]) NEGATIVE NEGATIVE Final  MRSA PCR Screening     Status: None   Collection Time: 03/25/16  2:20 AM  Result Value Ref Range Status   MRSA by PCR NEGATIVE NEGATIVE Final    Comment:        The GeneXpert MRSA Assay (FDA approved for NASAL specimens only), is one component of a comprehensive MRSA colonization surveillance program. It is not intended to diagnose MRSA infection nor to guide or monitor treatment for MRSA infections.     RADIOLOGY:  Dg Chest 1 View  Result Date: 03/26/2016 CLINICAL DATA:  COPD. EXAM: CHEST 1 VIEW COMPARISON:  03/25/2016. FINDINGS: Mediastinum and hilar structures normal. Cardiomegaly with normal pulmonary vascularity. Right lower lobe infiltrate consistent pneumonia. Mild basilar atelectasis. No prominent pleural effusion. No pneumothorax . IMPRESSION: 1.  Prominent right base infiltrate consistent pneumonia. 2.  Low lung volumes with basilar atelectasis. 3. Cardiomegaly.  No pulmonary venous congestion. Electronically Signed   By: Marcello Moores  Register   On: 03/26/2016 14:04    EKG:   Orders placed or performed during the hospital encounter of 03/24/16  . ED EKG  . ED  EKG      Management plans discussed with the patient, family and they are in agreement.  CODE STATUS:     Code Status Orders        Start     Ordered   03/24/16 1416  Full code  Continuous     03/24/16 1416    Code Status History    Date Active Date Inactive Code Status Order ID Comments User Context   This patient has a current code status but no historical code status.    Advance Directive Documentation   [place] Most Recent Value  Type of Advance Directive  Living will  Pre-existing out of facility DNR order (yellow form or pink MOST form)  No data  "MOST" Form in Place?  No data      TOTAL TIME TAKING CARE OF THIS PATIENT: 35 minutes.    Vaughan Basta M.D on 03/28/2016 at 7:55 AM  Between 7am to 6pm - Pager - [phone]  After 6pm go to [email or web address] - password [email or web address] [facility]  Office  [phone]  CC: Primary care physician; Baltazar Apo, MD   Note: This dictation was prepared with Dragon dictation along with smaller phrase technology. Any transcriptional errors that result from this process are unintentional.

## 2016-03-29 ENCOUNTER — Other Ambulatory Visit: Payer: Self-pay | Admitting: *Deleted

## 2016-03-29 DIAGNOSIS — J189 Pneumonia, unspecified organism: Secondary | ICD-10-CM

## 2016-05-05 ENCOUNTER — Encounter: Payer: Self-pay | Admitting: Emergency Medicine

## 2016-05-05 ENCOUNTER — Inpatient Hospital Stay
Admission: EM | Admit: 2016-05-05 | Discharge: 2016-05-07 | DRG: 190 | Disposition: A | Discharge status: Home Health | Payer: Medicare Other | Attending: Internal Medicine | Admitting: Internal Medicine

## 2016-05-05 ENCOUNTER — Emergency Department: Payer: Medicare Other

## 2016-05-05 DIAGNOSIS — Z79899 Other long term (current) drug therapy: Secondary | ICD-10-CM

## 2016-05-05 DIAGNOSIS — J40 Bronchitis, not specified as acute or chronic: Secondary | ICD-10-CM

## 2016-05-05 DIAGNOSIS — F329 Major depressive disorder, single episode, unspecified: Secondary | ICD-10-CM | POA: Diagnosis present

## 2016-05-05 DIAGNOSIS — D649 Anemia, unspecified: Secondary | ICD-10-CM | POA: Diagnosis present

## 2016-05-05 DIAGNOSIS — D72829 Elevated white blood cell count, unspecified: Secondary | ICD-10-CM | POA: Diagnosis present

## 2016-05-05 DIAGNOSIS — Z7984 Long term (current) use of oral hypoglycemic drugs: Secondary | ICD-10-CM | POA: Diagnosis not present

## 2016-05-05 DIAGNOSIS — R0602 Shortness of breath: Secondary | ICD-10-CM | POA: Diagnosis present

## 2016-05-05 DIAGNOSIS — J441 Chronic obstructive pulmonary disease with (acute) exacerbation: Secondary | ICD-10-CM | POA: Diagnosis present

## 2016-05-05 DIAGNOSIS — J9621 Acute and chronic respiratory failure with hypoxia: Secondary | ICD-10-CM | POA: Diagnosis present

## 2016-05-05 DIAGNOSIS — I129 Hypertensive chronic kidney disease with stage 1 through stage 4 chronic kidney disease, or unspecified chronic kidney disease: Secondary | ICD-10-CM | POA: Diagnosis present

## 2016-05-05 DIAGNOSIS — Z96643 Presence of artificial hip joint, bilateral: Secondary | ICD-10-CM | POA: Diagnosis present

## 2016-05-05 DIAGNOSIS — Z7982 Long term (current) use of aspirin: Secondary | ICD-10-CM

## 2016-05-05 DIAGNOSIS — E1165 Type 2 diabetes mellitus with hyperglycemia: Secondary | ICD-10-CM | POA: Diagnosis present

## 2016-05-05 DIAGNOSIS — E1122 Type 2 diabetes mellitus with diabetic chronic kidney disease: Secondary | ICD-10-CM | POA: Diagnosis present

## 2016-05-05 DIAGNOSIS — N179 Acute kidney failure, unspecified: Secondary | ICD-10-CM | POA: Diagnosis present

## 2016-05-05 DIAGNOSIS — Z8249 Family history of ischemic heart disease and other diseases of the circulatory system: Secondary | ICD-10-CM | POA: Diagnosis not present

## 2016-05-05 DIAGNOSIS — E669 Obesity, unspecified: Secondary | ICD-10-CM | POA: Diagnosis present

## 2016-05-05 DIAGNOSIS — N183 Chronic kidney disease, stage 3 (moderate): Secondary | ICD-10-CM | POA: Diagnosis present

## 2016-05-05 DIAGNOSIS — Z87891 Personal history of nicotine dependence: Secondary | ICD-10-CM

## 2016-05-05 DIAGNOSIS — Z961 Presence of intraocular lens: Secondary | ICD-10-CM | POA: Diagnosis present

## 2016-05-05 DIAGNOSIS — E039 Hypothyroidism, unspecified: Secondary | ICD-10-CM | POA: Diagnosis present

## 2016-05-05 DIAGNOSIS — R0600 Dyspnea, unspecified: Secondary | ICD-10-CM | POA: Diagnosis not present

## 2016-05-05 DIAGNOSIS — R0603 Acute respiratory distress: Secondary | ICD-10-CM | POA: Diagnosis present

## 2016-05-05 DIAGNOSIS — K219 Gastro-esophageal reflux disease without esophagitis: Secondary | ICD-10-CM | POA: Diagnosis present

## 2016-05-05 DIAGNOSIS — Z825 Family history of asthma and other chronic lower respiratory diseases: Secondary | ICD-10-CM

## 2016-05-05 DIAGNOSIS — Z9049 Acquired absence of other specified parts of digestive tract: Secondary | ICD-10-CM

## 2016-05-05 DIAGNOSIS — Z6834 Body mass index (BMI) 34.0-34.9, adult: Secondary | ICD-10-CM

## 2016-05-05 DIAGNOSIS — Z9981 Dependence on supplemental oxygen: Secondary | ICD-10-CM

## 2016-05-05 DIAGNOSIS — Q613 Polycystic kidney, unspecified: Secondary | ICD-10-CM | POA: Diagnosis not present

## 2016-05-05 DIAGNOSIS — I2510 Atherosclerotic heart disease of native coronary artery without angina pectoris: Secondary | ICD-10-CM | POA: Diagnosis present

## 2016-05-05 LAB — BASIC METABOLIC PANEL
Anion gap: 10 (ref 5–15)
BUN: 17 mg/dL (ref 6–20)
CO2: 25 mmol/L (ref 22–32)
Calcium: 8.4 mg/dL — ABNORMAL LOW (ref 8.9–10.3)
Chloride: 102 mmol/L (ref 101–111)
Creatinine, Ser: 1.63 mg/dL — ABNORMAL HIGH (ref 0.44–1.00)
GFR calc Af Amer: 35 mL/min — ABNORMAL LOW (ref >60)
GFR calc non Af Amer: 30 mL/min — ABNORMAL LOW (ref >60)
Glucose, Bld: 211 mg/dL — ABNORMAL HIGH (ref 65–99)
Potassium: 4 mmol/L (ref 3.5–5.1)
Sodium: 137 mmol/L (ref 135–145)

## 2016-05-05 LAB — CBC
HCT: 30.8 % — ABNORMAL LOW (ref 35.0–47.0)
Hemoglobin: 9.7 g/dL — ABNORMAL LOW (ref 12.0–16.0)
MCH: 26.4 pg (ref 26.0–34.0)
MCHC: 31.6 g/dL — ABNORMAL LOW (ref 32.0–36.0)
MCV: 83.7 fL (ref 80.0–100.0)
Platelets: 343 10*3/uL (ref 150–440)
RBC: 3.68 MIL/uL — ABNORMAL LOW (ref 3.80–5.20)
RDW: 18.8 % — ABNORMAL HIGH (ref 11.5–14.5)
WBC: 15 10*3/uL — ABNORMAL HIGH (ref 3.6–11.0)

## 2016-05-05 LAB — GLUCOSE, CAPILLARY
Glucose-Capillary: 235 mg/dL — ABNORMAL HIGH (ref 65–99)
Glucose-Capillary: 268 mg/dL — ABNORMAL HIGH (ref 65–99)
Glucose-Capillary: 250 mg/dL — ABNORMAL HIGH (ref 65–99)
Glucose-Capillary: 221 mg/dL — ABNORMAL HIGH (ref 65–99)

## 2016-05-05 LAB — BLOOD GAS, VENOUS
Acid-base deficit: 0.9 mmol/L (ref 0.0–2.0)
Bicarbonate: 25.3 mmol/L (ref 20.0–28.0)
O2 Saturation: 80.2 %
Patient temperature: 37
pCO2, Ven: 48 mmHg (ref 44.0–60.0)
pH, Ven: 7.33 (ref 7.250–7.430)
pO2, Ven: 48 mmHg — ABNORMAL HIGH (ref 32.0–45.0)

## 2016-05-05 LAB — MRSA PCR SCREENING: MRSA by PCR: NEGATIVE

## 2016-05-05 LAB — TROPONIN I: Troponin I: <0.03 ng/mL (ref <0.03)

## 2016-05-05 LAB — BRAIN NATRIURETIC PEPTIDE: B Natriuretic Peptide: 165 pg/mL — ABNORMAL HIGH (ref 0.0–100.0)

## 2016-05-05 MED ORDER — INSULIN ASPART 100 UNIT/ML ~~LOC~~ SOLN
0.0000 [IU] | Freq: Every day | SUBCUTANEOUS | Status: DC
  Administered 2016-05-05: 2 [IU] via SUBCUTANEOUS
  Administered 2016-05-06: 3 [IU] via SUBCUTANEOUS
  Filled 2016-05-05: qty 2
  Filled 2016-05-05: qty 3

## 2016-05-05 MED ORDER — LEVOFLOXACIN IN D5W 500 MG/100ML IV SOLN
500.0000 mg | Freq: Once | INTRAVENOUS | Status: AC
  Administered 2016-05-05: 500 mg via INTRAVENOUS
  Filled 2016-05-05: qty 100

## 2016-05-05 MED ORDER — SODIUM CHLORIDE 0.9 % IV SOLN
250.0000 mL | INTRAVENOUS | Status: DC | PRN
  Administered 2016-05-05: 250 mL via INTRAVENOUS

## 2016-05-05 MED ORDER — ONDANSETRON HCL 4 MG/2ML IJ SOLN: 4.0000 mg | Freq: Four times a day (QID) | INTRAMUSCULAR | Status: DC | PRN

## 2016-05-05 MED ORDER — METHYLPREDNISOLONE SODIUM SUCC 40 MG IJ SOLR
40.0000 mg | Freq: Two times a day (BID) | INTRAMUSCULAR | Status: DC
  Administered 2016-05-05 – 2016-05-06 (×3): 40 mg via INTRAVENOUS
  Filled 2016-05-05 (×3): qty 1

## 2016-05-05 MED ORDER — FAMOTIDINE IN NACL 20-0.9 MG/50ML-% IV SOLN
20.0000 mg | Freq: Two times a day (BID) | INTRAVENOUS | Status: DC
  Administered 2016-05-05: 20 mg via INTRAVENOUS
  Filled 2016-05-05: qty 50

## 2016-05-05 MED ORDER — FLUOXETINE HCL 20 MG PO CAPS
40.0000 mg | ORAL_CAPSULE | Freq: Every day | ORAL | Status: DC
  Administered 2016-05-05 – 2016-05-07 (×3): 40 mg via ORAL
  Filled 2016-05-05 (×3): qty 2

## 2016-05-05 MED ORDER — ACETAMINOPHEN 325 MG PO TABS: 650.0000 mg | ORAL_TABLET | ORAL | Status: DC | PRN

## 2016-05-05 MED ORDER — FUROSEMIDE 10 MG/ML IJ SOLN
40.0000 mg | Freq: Once | INTRAMUSCULAR | Status: AC
  Administered 2016-05-05: 40 mg via INTRAVENOUS
  Filled 2016-05-05: qty 4

## 2016-05-05 MED ORDER — IPRATROPIUM-ALBUTEROL 0.5-2.5 (3) MG/3ML IN SOLN
3.0000 mL | RESPIRATORY_TRACT | Status: DC
Start: 2016-05-05 — End: 2016-05-07
  Administered 2016-05-05 – 2016-05-07 (×13): 3 mL via RESPIRATORY_TRACT
  Filled 2016-05-05 (×13): qty 3

## 2016-05-05 MED ORDER — ORAL CARE MOUTH RINSE
15.0000 mL | Freq: Two times a day (BID) | OROMUCOSAL | Status: DC
  Administered 2016-05-05 – 2016-05-07 (×2): 15 mL via OROMUCOSAL

## 2016-05-05 MED ORDER — SODIUM CHLORIDE 0.9% FLUSH: 3.0000 mL | INTRAVENOUS | Status: DC | PRN

## 2016-05-05 MED ORDER — ENOXAPARIN SODIUM 40 MG/0.4ML ~~LOC~~ SOLN
40.0000 mg | SUBCUTANEOUS | Status: DC
  Administered 2016-05-05 – 2016-05-07 (×3): 40 mg via SUBCUTANEOUS
  Filled 2016-05-05 (×3): qty 0.4

## 2016-05-05 MED ORDER — TRAZODONE HCL 50 MG PO TABS
25.0000 mg | ORAL_TABLET | Freq: Every day | ORAL | Status: DC
  Administered 2016-05-05 – 2016-05-06 (×2): 25 mg via ORAL
  Filled 2016-05-05 (×2): qty 1

## 2016-05-05 MED ORDER — MAGNESIUM SULFATE 2 GM/50ML IV SOLN
2.0000 g | Freq: Once | INTRAVENOUS | Status: AC
  Administered 2016-05-05: 2 g via INTRAVENOUS
  Filled 2016-05-05: qty 50

## 2016-05-05 MED ORDER — IPRATROPIUM-ALBUTEROL 0.5-2.5 (3) MG/3ML IN SOLN
3.0000 mL | Freq: Once | RESPIRATORY_TRACT | Status: AC
  Administered 2016-05-05: 3 mL via RESPIRATORY_TRACT
  Filled 2016-05-05: qty 3

## 2016-05-05 MED ORDER — FAMOTIDINE IN NACL 20-0.9 MG/50ML-% IV SOLN
20.0000 mg | INTRAVENOUS | Status: DC
  Administered 2016-05-06 – 2016-05-07 (×2): 20 mg via INTRAVENOUS
  Filled 2016-05-05 (×2): qty 50

## 2016-05-05 MED ORDER — BUDESONIDE 0.5 MG/2ML IN SUSP
0.5000 mg | Freq: Two times a day (BID) | RESPIRATORY_TRACT | Status: DC
  Administered 2016-05-05 – 2016-05-07 (×5): 0.5 mg via RESPIRATORY_TRACT
  Filled 2016-05-05 (×6): qty 2

## 2016-05-05 MED ORDER — SODIUM CHLORIDE 0.9 % IV SOLN: 250.0000 mL | INTRAVENOUS | Status: DC | PRN

## 2016-05-05 MED ORDER — SODIUM CHLORIDE 0.9% FLUSH
3.0000 mL | Freq: Two times a day (BID) | INTRAVENOUS | Status: DC
  Administered 2016-05-05 – 2016-05-07 (×5): 3 mL via INTRAVENOUS

## 2016-05-05 MED ORDER — ALBUTEROL SULFATE (2.5 MG/3ML) 0.083% IN NEBU
2.5000 mg | INHALATION_SOLUTION | Freq: Once | RESPIRATORY_TRACT | Status: AC
  Administered 2016-05-05: 2.5 mg via RESPIRATORY_TRACT
  Filled 2016-05-05: qty 3

## 2016-05-05 MED ORDER — INSULIN ASPART 100 UNIT/ML ~~LOC~~ SOLN
0.0000 [IU] | Freq: Three times a day (TID) | SUBCUTANEOUS | Status: DC
  Administered 2016-05-05: 5 [IU] via SUBCUTANEOUS
  Administered 2016-05-05 – 2016-05-06 (×2): 3 [IU] via SUBCUTANEOUS
  Administered 2016-05-06: 2 [IU] via SUBCUTANEOUS
  Filled 2016-05-05: qty 3
  Filled 2016-05-05: qty 2
  Filled 2016-05-05: qty 5
  Filled 2016-05-05: qty 3

## 2016-05-05 NOTE — ED Notes (Signed)
Pt removed from bipap to administer breathing treatment and placed on 4 liters [place]. Continue to monitor.

## 2016-05-05 NOTE — ED Provider Notes (Signed)
[facility] Emergency Department Provider Note   ____________________________________________   First MD Initiated Contact with Patient 05/05/16 [phone]     (approximate)  I have reviewed the triage vital signs and the nursing notes.   HISTORY  Chief Complaint Shortness of Breath   HPI Mckenzie Taylor is a 76 y.o. female who comes into the hospital today with some respiratory distress. The patient reports that the shortness was started at 3 AM. She has a history of COPD and CHF. The patient also has a recent diagnosis of pneumonia and was admitted to the hospital on January 10. According to EMS the patient's initial O2 saturations were 77% on room air. The patient wears O2 at night 3 L by nasal cannula. The patient was given 2 DuoNeb's by EMS and 125 of Solu-Medrol. She reports that she's had a cough since the pneumonia and has had burning and tightness in her chest. The patient has been dizzy and lightheaded when she stands. She used her inhalers at home but it did not seem to help. She denies any nausea vomiting or abdominal pain. She had a low-grade temperature of 99. The patient is here today for evaluation.   Past Medical History:  Diagnosis Date  . Autosomal recessive polycystic kidneys   . Avascular necrosis of hip (HCC)    bilateral  . CAD (coronary artery disease)    mild  . CHF (congestive heart failure) ([place])   . Chronic kidney disease    cyst  . COPD (chronic obstructive pulmonary disease) ([place])   . Diabetes type 2, controlled ([place])   . GERD (gastroesophageal reflux disease)   . HTN (hypertension)   . Hypothyroidism    subclinical. low TSH, normal thyroid panel. biopsy 2010  . Vitamin B12 deficiency     Patient Active Problem List   Diagnosis Date Noted  . Acute respiratory failure ([place]) 03/24/2016  . 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 TOBACCO ABUSE 12/17/2008  . Coronary atherosclerosis of native coronary artery 12/17/2008    . 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 CLAUDICATION, INTERMITTENT 12/17/2008    Past Surgical History:  Procedure Laterality Date  . CHOLECYSTECTOMY    . hip replacement     bilateral-secondary to avascular necrosis  . right eye lens replacement    . ULNAR NERVE REPAIR     bilateral  . VESICOVAGINAL FISTULA CLOSURE W/ TAH      Prior to Admission medications   Medication Sig Start Date End Date Taking? Authorizing Provider  albuterol (PROVENTIL) (2.5 MG/3ML) 0.083% nebulizer solution Take by nebulization every 4 (four) hours as needed for wheezing or shortness of breath.    Historical Provider, MD  albuterol (VENTOLIN HFA) 108 (90 BASE) MCG/ACT inhaler Inhale 2 puffs into the lungs every 6 (six) hours as needed.      Historical Provider, MD  amLODipine (NORVASC) 10 MG tablet Take 10 mg by mouth daily.      Historical Provider, MD  aspirin 81 MG EC tablet Take 81 mg by mouth daily.      Historical Provider, MD  atorvastatin (LIPITOR) 40 MG tablet Take 40 mg by mouth daily.    Historical Provider, MD  enalapril (VASOTEC) 20 MG tablet Take 20 mg by mouth 2 (two) times daily.      Historical Provider, MD  FLUoxetine (PROZAC) 40 MG capsule Take 40 mg by mouth.    Historical Provider, MD  gabapentin (NEURONTIN) 100 MG capsule Take 200 mg  by mouth 3 (three) times daily.    Historical Provider, MD  glipiZIDE (GLUCOTROL XL) 2.5 MG 24 hr tablet Take 2.5 mg by mouth daily with breakfast.    Historical Provider, MD  ibuprofen (ADVIL,MOTRIN) 800 MG tablet Take 800 mg by mouth every 8 (eight) hours as needed.      Historical Provider, MD  ipratropium-albuterol (DUONEB) 0.5-2.5 (3) MG/3ML SOLN Take 3 mLs by nebulization 4 (four) times daily. 03/28/16   Vaughan Basta, MD  omeprazole (PRILOSEC) 20 MG capsule Take 20 mg by mouth daily.    Historical Provider, MD  predniSONE (STERAPRED UNI-PAK 21 TAB) 10 MG (21) TBPK tablet Take 6 tabs first day, 5 tab on day 2, then 4 on  day 3rd, 3 tabs on day 4th , 2 tab on day 5th, and 1 tab on 6th day. 03/28/16   Vaughan Basta, MD  tiZANidine (ZANAFLEX) 4 MG capsule Take 4 mg by mouth 3 (three) times daily as needed for muscle spasms.    Historical Provider, MD  umeclidinium bromide (INCRUSE ELLIPTA) 62.5 MCG/INH AEPB Inhale 1 puff into the lungs daily.    Historical Provider, MD    Allergies Patient has no known allergies.  Family History  Problem Relation Age of Onset  . Cancer Father     lung  . COPD Mother   . Heart disease Mother   . Heart disease Maternal Uncle     Social History Social History  Substance Use Topics  . Smoking status: Former Smoker    Packs/day: 2.00  . Smokeless tobacco: Never Used     Comment: 1 ppd - 50 years   . Alcohol use No    Review of Systems Constitutional: No fever/chills Eyes: No visual changes. ENT: No sore throat. Cardiovascular: Chest tightness Respiratory: Cough and shortness of breath. Gastrointestinal: No abdominal pain.  No nausea, no vomiting.  No diarrhea.  No constipation. Genitourinary: Negative for dysuria. Musculoskeletal: Negative for back pain. Skin: Negative for rash. Neurological: Negative for headaches, focal weakness or numbness.  10-point ROS otherwise negative.  ____________________________________________   PHYSICAL EXAM:  VITAL SIGNS: ED Triage Vitals [05/05/16 0600]  Enc Vitals Group     BP 116/74     Pulse 98     Resp 17     Temp      Temp src      SpO2 96%     Weight 214 lb (97.1 kg)     Height      Head Circumference      Peak Flow      Pain Score      Pain Loc      Pain Edu?      Excl. in [place]?     Constitutional: Alert and oriented. Well appearing and in Moderate distress. Eyes: Conjunctivae are normal. PERRL. EOMI. Head: Atraumatic. Nose: No congestion/rhinnorhea. Mouth/Throat: Mucous membranes are moist.  Oropharynx non-erythematous. Cardiovascular: Normal rate, regular rhythm. Grossly normal heart sounds.   Good peripheral circulation. Respiratory: Increased respiratory effort.  No retractions. Prolonged expiratory phase with some expiratory wheezes throughout Gastrointestinal: Soft and nontender. No distention. Positive bowel sounds Musculoskeletal: No lower extremity tenderness nor edema.   Neurologic:  Normal speech and language.  Skin:  Skin is warm, dry and intact.  Psychiatric: Mood and affect are normal.   ____________________________________________   LABS (all labs ordered are listed, but only abnormal results are displayed)  Labs Reviewed  CBC - Abnormal; Notable for the following:  Result Value   WBC 15.0 (*)    RBC 3.68 (*)    Hemoglobin 9.7 (*)    HCT 30.8 (*)    MCHC 31.6 (*)    RDW 18.8 (*)    All other components within normal limits  BASIC METABOLIC PANEL - Abnormal; Notable for the following:    Glucose, Bld 211 (*)    Creatinine, Ser 1.63 (*)    Calcium 8.4 (*)    GFR calc non Af Amer 30 (*)    GFR calc Af Amer 35 (*)    All other components within normal limits  BRAIN NATRIURETIC PEPTIDE - Abnormal; Notable for the following:    B Natriuretic Peptide 165.0 (*)    All other components within normal limits  BLOOD GAS, VENOUS - Abnormal; Notable for the following:    pO2, Ven 48.0 (*)    All other components within normal limits  TROPONIN I   ____________________________________________  EKG  ED ECG REPORT I, Loney Hering, the attending physician, personally viewed and interpreted this ECG.   Date: 05/05/2016  EKG Time: 547  Rate: 115  Rhythm: sinus tachycardia  Axis: normal  Intervals:prolonged qtc  ST&T Change: normal  ____________________________________________  RADIOLOGY  CXR ____________________________________________   PROCEDURES  Procedure(s) performed: None  Procedures  Critical Care performed: Yes, see critical care note(s)   CRITICAL CARE Performed by: Charlesetta Ivory P   Total critical care time: 30  minutes  Critical care time was exclusive of separately billable procedures and treating other patients.  Critical care was necessary to treat or prevent imminent or life-threatening deterioration.  Critical care was time spent personally by me on the following activities: development of treatment plan with patient and/or surrogate as well as nursing, discussions with consultants, evaluation of patient's response to treatment, examination of patient, obtaining history from patient or surrogate, ordering and performing treatments and interventions, ordering and review of laboratory studies, ordering and review of radiographic studies, pulse oximetry and re-evaluation of patient's condition.   ____________________________________________   INITIAL IMPRESSION / ASSESSMENT AND PLAN / ED COURSE  Pertinent labs & imaging results that were available during my care of the patient were reviewed by me and considered in my medical decision making (see chart for details).  This is a 76 year old female who comes into the hospital today with some shortness of breath. The patient was admitted about a month ago with similar symptoms. The patient ready received some medication so we placed her on BiPAP as she was having some difficulty. I will give her some magnesium sulfate and another neb treatment. I will reassess the patient once he is received her medication and have received her blood work and imaging results.  Clinical Course as of May 05 713  Wed May 05, 2016  4098 1. No acute cardiopulmonary process. 2. Cardiomegaly without vascular congestion or edema.   DG Chest Portable 1 View [AW]    Clinical Course User Index [AW] Loney Hering, MD   I did reassess the patient and she was still having some significant amounts of wheezing. I will give the patient some levofloxacin and I will admit her to the intensive care service. I discussed the case with Dr. Mortimer Fries and he will accept the  patient.  ____________________________________________   FINAL CLINICAL IMPRESSION(S) / ED DIAGNOSES  Final diagnoses:  COPD exacerbation ([place])  Shortness of breath  Bronchitis      NEW MEDICATIONS STARTED DURING THIS VISIT:  New Prescriptions  No medications on file     Note:  This document was prepared using Dragon voice recognition software and may include unintentional dictation errors.    Loney Hering, MD 05/05/16 [phone]

## 2016-05-05 NOTE — ED Notes (Signed)
resp notified of pt being transferred to ICU 11 and will come get BIPAP machine.

## 2016-05-05 NOTE — H&P (Signed)
PULMONARY / CRITICAL CARE MEDICINE   Name: Mckenzie Taylor MRN: [id number] DOB: 02/28/1941    ADMISSION DATE:  05/05/2016 CONSULTATION DATE:  05/05/2016  REFERRING MD:  Dr. Charlesetta Ivory  CHIEF COMPLAINT:  Shortness of Breath  HISTORY OF PRESENT ILLNESS:   Ms. Mckenzie Taylor is a 76 y.o. Female with a PMH of Vitamin B12 deficiency, Hypothyroidism, HTN, GERD, DM Type 2, COPD requiring 3L O2 at home at night, Former smoker, Chronic Kidney Disease, CHF, CAD, Avascular necrosis of hip, and Autosomal Recessive Polycystic Kidneys.  She presents to [facility] ER on 05/05/16 via EMS with c/o progressive Shortness of breath.  She awoke around 0300 with severe SOB in which she used her home inhalers and Nebulizer treatments with no improvement of symptoms, prompting her to notify EMS.  Upon arrival to ER she was hypoxic, with O2 sats 84% on room air, and wheezing.  Per ER notes she received Solu-medrol and 2 Duonebs by EMS.  In ER she received Magnesium, 1 additional duoneb treatment, and placed on Bipap.  Pt was admitted recently to [facility] from 03/24/16 to 03/28/16 for treatment of Acute Respiratory Failure secondary to AECOPD, CHF exacerbation, and CAP.  She was discharged home on and completed course of Z-pack, Levaquin, and Prednisone taper.  PCCM is contacted on 2/21 for admission to ICU/StepDown unit, and further management of Acute on Chronic Hypoxic Respiratory Failure secondary to AECOPD and questionable CHF exacerbation requiring Bipap.  PAST MEDICAL HISTORY :  She  has a past medical history of Autosomal recessive polycystic kidneys; Avascular necrosis of hip (HCC); CAD (coronary artery disease); CHF (congestive heart failure) ([place]); Chronic kidney disease; COPD (chronic obstructive pulmonary disease) ([place]); Diabetes type 2, controlled ([place]); GERD (gastroesophageal reflux disease); HTN (hypertension); Hypothyroidism; and Vitamin B12 deficiency.  PAST SURGICAL HISTORY: She  has a past surgical history that  includes hip replacement; Vesicovaginal fistula closure w/ TAH; Cholecystectomy; right eye lens replacement; and Ulnar nerve repair.  No Known Allergies  No current facility-administered medications on file prior to encounter.    Current Outpatient Prescriptions on File Prior to Encounter  Medication Sig  . albuterol (PROVENTIL) (2.5 MG/3ML) 0.083% nebulizer solution Take by nebulization every 4 (four) hours as needed for wheezing or shortness of breath.  Marland Kitchen albuterol (VENTOLIN HFA) 108 (90 BASE) MCG/ACT inhaler Inhale 2 puffs into the lungs every 6 (six) hours as needed.    Marland Kitchen amLODipine (NORVASC) 10 MG tablet Take 10 mg by mouth daily.    Marland Kitchen atorvastatin (LIPITOR) 40 MG tablet Take 40 mg by mouth daily.  . enalapril (VASOTEC) 20 MG tablet Take 20 mg by mouth 2 (two) times daily.    Marland Kitchen FLUoxetine (PROZAC) 40 MG capsule Take 40 mg by mouth.  Marland Kitchen glipiZIDE (GLUCOTROL XL) 2.5 MG 24 hr tablet Take 2.5 mg by mouth daily.   Marland Kitchen ibuprofen (ADVIL,MOTRIN) 800 MG tablet Take 800 mg by mouth every 8 (eight) hours as needed.    Marland Kitchen ipratropium-albuterol (DUONEB) 0.5-2.5 (3) MG/3ML SOLN Take 3 mLs by nebulization 4 (four) times daily.  Marland Kitchen omeprazole (PRILOSEC) 20 MG capsule Take 20 mg by mouth daily.  Marland Kitchen tiZANidine (ZANAFLEX) 4 MG capsule Take 4 mg by mouth 3 (three) times daily as needed for muscle spasms.  Marland Kitchen umeclidinium bromide (INCRUSE ELLIPTA) 62.5 MCG/INH AEPB Inhale 1 puff into the lungs daily.    FAMILY HISTORY:  Her indicated that her mother is deceased. She indicated that her father is deceased. She indicated that the status of her maternal  uncle is unknown.    SOCIAL HISTORY: She  reports that she has quit smoking. She smoked 2.00 packs per day. She has never used smokeless tobacco. She reports that she does not drink alcohol or use drugs.  REVIEW OF SYSTEMS:  Positives in BOLD ROS  Gen: Fever, chills, weight change, fatigue, night sweats HEENT: Blurred vision, double vision, hearing loss,  tinnitus, sinus congestion, rhinorrhea, sore throat, neck stiffness, dysphagia PULM: Shortness of breath, cough, sputum production, hemoptysis, wheezing CV: Chest pain, edema, orthopnea, paroxysmal nocturnal dyspnea, palpitations GI: Abdominal pain, nausea, vomiting, diarrhea, hematochezia, melena, constipation, change in bowel habits GU: Dysuria, hematuria, polyuria, oliguria, urethral discharge Endocrine: Hot or cold intolerance, polyuria, polyphagia or appetite change Derm: Rash, dry skin, scaling or peeling skin change Heme: Easy bruising, bleeding, bleeding gums Neuro: Headache, numbness, weakness, slurred speech, loss of memory or consciousness   SUBJECTIVE:  Pt states she continues to have Shortness of Breath  VITAL SIGNS: BP 117/73   Pulse (!) 111   Temp 98.2 F (36.8 C) (Oral)   Resp 18   Ht 5\' 5"  (1.651 m)   Wt 208 lb 8.9 oz (94.6 kg)   SpO2 94%   BMI 34.71 kg/m   HEMODYNAMICS:    VENTILATOR SETTINGS:    INTAKE / OUTPUT: No intake/output data recorded.  PHYSICAL EXAMINATION: General:  Ill appearing caucasian female, lying in bed, in mild acute respiratory distress Neuro:  Alert and oriented x4, follows commands, PERRL HEENT:  Atraumatic, Normocephalic Cardiovascular:  Tachycardia, Regular rate and rhythm, s1s2 noted, No R/M/G Lungs:  Expiratory Wheezes throughout, accessory muscle use, symmetrical expansion Abdomen:  Obese, BS x4, Soft, Nontender, Nondistended Musculoskeletal:  Normal bulk and tone, No edema Skin:  Dry, intact. No apparent rashes, lesions, or ulcerations  LABS:  BMET  Recent Labs Lab 05/05/16 0543  NA 137  K 4.0  CL 102  CO2 25  BUN 17  CREATININE 1.63*  GLUCOSE 211*    Electrolytes  Recent Labs Lab 05/05/16 0543  CALCIUM 8.4*    CBC  Recent Labs Lab 05/05/16 0543  WBC 15.0*  HGB 9.7*  HCT 30.8*  PLT 343    Coag's No results for input(s): APTT, INR in the last 168 hours.  Sepsis Markers No results for  input(s): LATICACIDVEN, PROCALCITON, O2SATVEN in the last 168 hours.  ABG No results for input(s): PHART, PCO2ART, PO2ART in the last 168 hours.  Liver Enzymes No results for input(s): AST, ALT, ALKPHOS, BILITOT, ALBUMIN in the last 168 hours.  Cardiac Enzymes  Recent Labs Lab 05/05/16 0543  TROPONINI <0.03    Glucose  Recent Labs Lab 05/05/16 0837  GLUCAP 235*    Imaging Dg Chest Portable 1 View  Result Date: 05/05/2016 CLINICAL DATA:  76 year old female with shortness of breath and cough. EXAM: PORTABLE CHEST 1 VIEW COMPARISON:  Chest radiograph dated XII 18 FINDINGS: There is mild moderate cardiomegaly. No vascular congestion or edema. Mild diffuse chronic appearing interstitial coarsening with probable mild centrilobular emphysema. Bibasilar vascular crowding and atelectatic changes. No focal consolidation, pleural effusion, or pneumothorax. There is atherosclerotic calcification of the aortic arch. No acute osseous pathology. IMPRESSION: 1. No acute cardiopulmonary process. 2. Cardiomegaly without vascular congestion or edema. Electronically Signed   By: Anner Crete M.D.   On: 05/05/2016 06:15     STUDIES:  2/21 CXR>> There is mild moderate cardiomegaly. No vascular congestion or edema. Mild diffuse chronic appearing interstitial coarsening with probable mild centrilobular emphysema. Bibasilar vascular crowding and atelectatic  changes. No focal consolidation, pleural effusion, or pneumothorax. There is atherosclerotic calcification of the aortic arch. No acute osseous pathology.  CULTURES: None  ANTIBIOTICS: 2/21>> Levaquin x1 dose>>2/21  SIGNIFICANT EVENTS: 05/05/16>> Admission to [facility] ICU/Stepdown unit for Acute on Chronic Respiratory Failure secondary to AECOPD requiring Bipap  LINES/TUBES: None    ASSESSMENT / PLAN:  PULMONARY A: Acute Hypoxic Respiratory Failure secondary to AECOPD and ? CHF exacerbation Hx: COPD requiring 3L Home O2 qhs, CHF  P:    Supplemental O2 to maintain O2 sats >88% Bipap as needed IV Solu-Medrol Scheduled Duonebs and Budesonide Prn Duonebs  CARDIOVASCULAR A:  ? CHF exacerbation, elevated BNP, no vascular congestion on CXR Hx: CHF, HTN, CAD  P:  Will give one time dose IV Lasix 2/21 Will resume home Lasix dose 40 mg PO on 2/22 Resume home Lipitor Continuous Telemetry monitoring CXR in AM 2/22  RENAL A:   Acute Renal Failure Hx: Chronic Kidney Disease, Autosomal Recessive Polycystic Kidneys  P:   Monitor I&O Follow BMP's, especially with Lasix administration Replace electrolytes as needed per Protocol  GASTROINTESTINAL A:   No acute issues Hx: GERD  P:   Pepcid for PUD prophylaxis Heart healthy carb modified Diet  HEMATOLOGIC A:   Anemia  P:  Enoxaparin for VTE prophylaxis Monitor for S/Sx of bleeding Follow CBC's Transfuse for Hgb <7.0  INFECTIOUS A:   Leukocytosis  P:   Trend PCT due to recent Tx of CAP and completing course of Levaquin and Azithromycin If PCT is elevated, will initiate Empiric Abx Follow WBC's Monitor Fever Curve  ENDOCRINE A:   Hyperglycemia Hx: Type 2 Dm, Hypothyroidism    P:   CBG TID with meals & qhs Sliding Scale Insulin TID with meals & qhs Follow Hypo/Hyperglycemia protocol Hold home Metformin & Glipizide at this time  NEUROLOGIC A:   No acute issues Hx: Depression  P:   Provide supportive care Avoid Sedating medications Resume home Fluoxetine    Patient/Family are satisfied with Plan of action and management. All questions answered  Corrin Parker, M.D.  [facility]  Medical Director [place] Director [facility] Cardio-Pulmonary Department

## 2016-05-05 NOTE — ED Triage Notes (Signed)
Pt arrived by EMS from home with SOB. EMS reports pt woke with increasing SOB, recent diagnosis of pneumonia x3 weeks ago. Pt has HX of COPD and CHF. Pt on O2 at home, 2L @ night. Upon arrival pt O2 at 84%, RA.

## 2016-05-05 NOTE — Progress Notes (Signed)
Pt. Transferred to Hospitalist Service from [employer].  Spoke with Dr. Mortimer Fries.   Pt. Here due to COPD, CHF Exacerbation.  Weaned off Bipap and doing better.   Hospitalist to Take over service from tomorrow.

## 2016-05-06 ENCOUNTER — Inpatient Hospital Stay: Payer: Medicare Other

## 2016-05-06 LAB — BASIC METABOLIC PANEL
Anion gap: 9 (ref 5–15)
BUN: 28 mg/dL — ABNORMAL HIGH (ref 6–20)
CO2: 27 mmol/L (ref 22–32)
Calcium: 8.6 mg/dL — ABNORMAL LOW (ref 8.9–10.3)
Chloride: 103 mmol/L (ref 101–111)
Creatinine, Ser: 1.68 mg/dL — ABNORMAL HIGH (ref 0.44–1.00)
GFR calc Af Amer: 33 mL/min — ABNORMAL LOW (ref >60)
GFR calc non Af Amer: 29 mL/min — ABNORMAL LOW (ref >60)
Glucose, Bld: 206 mg/dL — ABNORMAL HIGH (ref 65–99)
Potassium: 4.9 mmol/L (ref 3.5–5.1)
Sodium: 139 mmol/L (ref 135–145)

## 2016-05-06 LAB — CBC
HCT: 29.4 % — ABNORMAL LOW (ref 35.0–47.0)
Hemoglobin: 9.6 g/dL — ABNORMAL LOW (ref 12.0–16.0)
MCH: 27 pg (ref 26.0–34.0)
MCHC: 32.5 g/dL (ref 32.0–36.0)
MCV: 83 fL (ref 80.0–100.0)
Platelets: 342 10*3/uL (ref 150–440)
RBC: 3.55 MIL/uL — ABNORMAL LOW (ref 3.80–5.20)
RDW: 18.8 % — ABNORMAL HIGH (ref 11.5–14.5)
WBC: 15.7 10*3/uL — ABNORMAL HIGH (ref 3.6–11.0)

## 2016-05-06 LAB — BLOOD GAS, ARTERIAL
Acid-Base Excess: 2.9 mmol/L — ABNORMAL HIGH (ref 0.0–2.0)
Bicarbonate: 27.9 mmol/L (ref 20.0–28.0)
FIO2: 0.32
O2 Saturation: 96.3 %
Patient temperature: 37
pCO2 arterial: 44 mmHg (ref 32.0–48.0)
pH, Arterial: 7.41 (ref 7.350–7.450)
pO2, Arterial: 83 mmHg (ref 83.0–108.0)

## 2016-05-06 LAB — GLUCOSE, CAPILLARY
Glucose-Capillary: 195 mg/dL — ABNORMAL HIGH (ref 65–99)
Glucose-Capillary: 230 mg/dL — ABNORMAL HIGH (ref 65–99)
Glucose-Capillary: 286 mg/dL — ABNORMAL HIGH (ref 65–99)
Glucose-Capillary: 272 mg/dL — ABNORMAL HIGH (ref 65–99)

## 2016-05-06 MED ORDER — PREDNISONE 50 MG PO TABS
50.0000 mg | ORAL_TABLET | Freq: Every day | ORAL | Status: DC
  Administered 2016-05-07: 09:00:00 50 mg via ORAL
  Filled 2016-05-06: qty 1

## 2016-05-06 MED ORDER — INSULIN ASPART 100 UNIT/ML ~~LOC~~ SOLN
0.0000 [IU] | Freq: Three times a day (TID) | SUBCUTANEOUS | Status: DC
  Administered 2016-05-06: 17:00:00 5 [IU] via SUBCUTANEOUS
  Administered 2016-05-07: 14:00:00 11 [IU] via SUBCUTANEOUS
  Administered 2016-05-07: 09:00:00 2 [IU] via SUBCUTANEOUS
  Filled 2016-05-06: qty 5
  Filled 2016-05-06: qty 11
  Filled 2016-05-06: qty 2

## 2016-05-06 NOTE — Evaluation (Signed)
Physical Therapy Evaluation Patient Details Name: Mckenzie Taylor MRN: [id number] DOB: 02/06/1941 Today's Date: 05/06/2016   History of Present Illness  76 y.o. Female with a PMH of Vitamin B12 deficiency, Hypothyroidism, HTN, GERD, DM Type 2, COPD requiring 3L O2 at home at night, Former smoker, Chronic Kidney Disease, CHF, CAD, Avascular necrosis of hip, and Autosomal Recessive Polycystic Kidneys.  She presents to [facility] ER on 05/05/16 via EMS with c/o progressive Shortness of breath.    Clinical Impression  Pt presents to PT with decreased endurance and difficulty walking and would benefit from acute PT services to address objective findings.  Pt amb without device with decreased endurance andn significant increase in work of breath noted at end of session using accessory muscles, especially abdominal.  Pt sats WNL's on 3L O2.  Pt with good safety awareness and no apparent cognitive deficits.     Follow Up Recommendations Home health PT    Equipment Recommendations  None recommended by PT    Recommendations for Other Services       Precautions / Restrictions Precautions Precautions: Fall Precaution Comments: MOD Restrictions Weight Bearing Restrictions: No      Mobility  Bed Mobility Overal bed mobility: Modified Independent             General bed mobility comments: Pt able to get into sitting position EOB without difficulty.  Transfers Overall transfer level: Modified independent Equipment used: None             General transfer comment: Sit<>stand rising slowly without asssitve device.  Ambulation/Gait Ambulation/Gait assistance: Modified independent (Device/Increase time) Ambulation Distance (Feet): 150 Feet Assistive device: None Gait Pattern/deviations: Step-through pattern     General Gait Details: Slow steady gait on 3L O2, pt pushing O2 tank herself, unable to maintain conversation during gait due to increased WOB, O2 sats stable  Stairs             Wheelchair Mobility    Modified Rankin (Stroke Patients Only)       Balance Overall balance assessment: Modified Independent                                           Pertinent Vitals/Pain Pain Assessment: No/denies pain    Home Living Family/patient expects to be discharged to:: Private residence Living Arrangements: Other relatives (second cousin, like a daughter, raised by patient) Available Help at Discharge: Available 24 hours/day;Family Type of Home: House Home Access: Stairs to enter Entrance Stairs-Rails: Left Entrance Stairs-Number of Steps: 1 Home Layout: One level Home Equipment: Cane - single point;Walker - 2 wheels;Wheelchair - manual      Prior Function Level of Independence: Independent with assistive device(s)         Comments: uses cane outside the home     Hand Dominance   Dominant Hand: Right    Extremity/Trunk Assessment   Upper Extremity Assessment Upper Extremity Assessment: Overall WFL for tasks assessed    Lower Extremity Assessment Lower Extremity Assessment: Overall WFL for tasks assessed       Communication   Communication: No difficulties  Cognition Arousal/Alertness: Awake/alert Behavior During Therapy: WFL for tasks assessed/performed Overall Cognitive Status: Within Functional Limits for tasks assessed                      General Comments General comments (skin integrity, edema, etc.): 3L O2  Exercises Other Exercises Other Exercises: Ambulation 150'   Assessment/Plan    PT Assessment Patient needs continued PT services  PT Problem List Decreased activity tolerance;Cardiopulmonary status limiting activity       PT Treatment Interventions Gait training;Functional mobility training;Therapeutic activities;Therapeutic exercise    PT Goals (Current goals can be found in the Care Plan section)  Acute Rehab PT Goals Patient Stated Goal: To go home. PT Goal Formulation: With  patient Time For Goal Achievement: 05/13/16 Potential to Achieve Goals: Good    Frequency Min 2X/week   Barriers to discharge        Co-evaluation               End of Session Equipment Utilized During Treatment: Oxygen;Gait belt Activity Tolerance: Other (comment) (limitedby cardiopulmonary status) Patient left: in bed;with family/visitor present Nurse Communication: Mobility status PT Visit Diagnosis: Difficulty in walking, not elsewhere classified (R26.2)    Functional Assessment Tool Used: AM-PAC 6 Clicks Basic Mobility Functional Limitation: Mobility: Walking and moving around Mobility: Walking and Moving Around Current Status (F4142): At least 20 percent but less than 40 percent impaired, limited or restricted Mobility: Walking and Moving Around Goal Status [phone]): At least 1 percent but less than 20 percent impaired, limited or restricted    Time: 1320-1350 PT Time Calculation (min) (ACUTE ONLY): 30 min   Charges:   PT Evaluation $PT Eval Low Complexity: 1 Procedure PT Treatments $Therapeutic Exercise: 8-22 mins   PT G Codes:   PT G-Codes **NOT FOR INPATIENT CLASS** Functional Assessment Tool Used: AM-PAC 6 Clicks Basic Mobility Functional Limitation: Mobility: Walking and moving around Mobility: Walking and Moving Around Current Status (Y2334): At least 20 percent but less than 40 percent impaired, limited or restricted Mobility: Walking and Moving Around Goal Status [phone]): At least 1 percent but less than 20 percent impaired, limited or restricted     [employer], PT 05/06/2016, 2:41 PM

## 2016-05-06 NOTE — Progress Notes (Signed)
Patients blood glucose 238 at 16:48. Data did not transfer from glucometer. Insulin given to patient per sliding scale order.

## 2016-05-06 NOTE — H&P (Signed)
PULMONARY / CRITICAL CARE MEDICINE   Name: Mckenzie Taylor MRN: [id number] DOB: [birth year]    ADMISSION DATE:  05/05/2016 CONSULTATION DATE:  05/05/2016  REFERRING MD:  Dr. Charlesetta Ivory  CHIEF COMPLAINT:  Shortness of Breath  HISTORY OF PRESENT ILLNESS:   Wheezing still persisitent but better this AM Patient asked to be placed on BIPAP last night Feeling better since admission Ok to transfer to gen med floor Still with SOB  REVIEW OF SYSTEMS:  Positives in BOLD ROS  Gen: Fever, chills, weight change, fatigue, night sweats HEENT: Blurred vision, double vision, hearing loss, tinnitus, sinus congestion, rhinorrhea, sore throat, neck stiffness, dysphagia PULM: Shortness of breath, cough, sputum production, hemoptysis, wheezing CV: Chest pain, edema, orthopnea, paroxysmal nocturnal dyspnea, palpitations GI: Abdominal pain, nausea, vomiting, diarrhea, hematochezia, melena, constipation, change in bowel habits GU: Dysuria, hematuria, polyuria, oliguria, urethral discharge Endocrine: Hot or cold intolerance, polyuria, polyphagia or appetite change Derm: Rash, dry skin, scaling or peeling skin change Heme: Easy bruising, bleeding, bleeding gums Neuro: Headache, numbness, weakness, slurred speech, loss of memory or consciousness    VITAL SIGNS: BP (!) 143/76   Pulse (!) 101   Temp 98.1 F (36.7 C)   Resp 20   Ht 5\' 5"  (1.651 m)   Wt 206 lb 9.1 oz (93.7 kg)   SpO2 94%   BMI 34.38 kg/m   HEMODYNAMICS:    VENTILATOR SETTINGS: FiO2 (%):  [32 %] 32 %  INTAKE / OUTPUT: I/O last 3 completed shifts: In: 28.7 [I.V.:28.7] Out: 250 [Urine:250]  PHYSICAL EXAMINATION: General:  NAD Neuro:  Alert and oriented x4, follows commands, PERRL HEENT:  Atraumatic, Normocephalic Cardiovascular:  Tachycardia, Regular rate and rhythm, s1s2 noted, No R/M/G Lungs:  Expiratory Wheezes throughout,  Abdomen:  Obese, BS x4, Soft, Nontender, Nondistended Musculoskeletal:  Normal bulk and  tone, No edema Skin:  Dry, intact. No apparent rashes, lesions, or ulcerations  LABS:  BMET  Recent Labs Lab 05/05/16 0543 05/06/16 0611  NA 137 139  K 4.0 4.9  CL 102 103  CO2 25 27  BUN 17 28*  CREATININE 1.63* 1.68*  GLUCOSE 211* 206*    Electrolytes  Recent Labs Lab 05/05/16 0543 05/06/16 0611  CALCIUM 8.4* 8.6*    CBC  Recent Labs Lab 05/05/16 0543 05/06/16 0611  WBC 15.0* 15.7*  HGB 9.7* 9.6*  HCT 30.8* 29.4*  PLT 343 342    Coag's No results for input(s): APTT, INR in the last 168 hours.  Sepsis Markers No results for input(s): LATICACIDVEN, PROCALCITON, O2SATVEN in the last 168 hours.  ABG  Recent Labs Lab 05/06/16 0546  PHART 7.41  PCO2ART 44  PO2ART 83    Liver Enzymes No results for input(s): AST, ALT, ALKPHOS, BILITOT, ALBUMIN in the last 168 hours.  Cardiac Enzymes  Recent Labs Lab 05/05/16 0543  TROPONINI <0.03    Glucose  Recent Labs Lab 05/05/16 0837 05/05/16 1408 05/05/16 1721 05/05/16 2154 05/06/16 0718  GLUCAP 235* 268* 250* 221* 195*    Imaging Dg Chest Port 1 View  Result Date: 05/06/2016 CLINICAL DATA:  76 year old female ICU patient. Follow-up chest radiograph. EXAM: PORTABLE CHEST 1 VIEW COMPARISON:  Chest radiograph dated 05/05/2016 FINDINGS: There has been interval development of increased opacity at the left lung base with partial silhouetting of the left cardiac border. Findings concerning for development of an infiltrative process involving the lingula. A small left pleural effusion with associated partial compressive atelectasis of the left lung base may be present. Right  lung base densities, likely atelectatic changes. There is no pneumothorax. There is mild cardiomegaly. No acute osseous pathology. IMPRESSION: Interval development of airspace opacity involving the left mid to lower lung field likely combination of a small left pleural effusion and left lung base subsegmental atelectasis/ infiltrate as  well as lingular infiltrate. Correlation with clinical exam recommended. PA and lateral views of the chest or CT may provide better evaluation. Electronically Signed   By: Anner Crete M.D.   On: 05/06/2016 04:47     STUDIES:  2/21 CXR>> There is mild moderate cardiomegaly. No vascular congestion or edema. Mild diffuse chronic appearing interstitial coarsening with probable mild centrilobular emphysema. Bibasilar vascular crowding and atelectatic changes. No focal consolidation, pleural effusion, or pneumothorax. There is atherosclerotic calcification of the aortic arch. No acute osseous pathology.  CULTURES: None  ANTIBIOTICS: 2/21>> Levaquin x1 dose>>2/21  SIGNIFICANT EVENTS: 05/05/16>> Admission to [facility] ICU/Stepdown unit for Acute on Chronic Respiratory Failure secondary to AECOPD requiring Bipap  LINES/TUBES: None    ASSESSMENT / PLAN:  PULMONARY A: Acute Hypoxic Respiratory Failure secondary to AECOPD and ? CHF exacerbation Hx: COPD requiring 3L Home O2 qhs, CHF  P:   Supplemental O2 to maintain O2 sats >88% Bipap as needed IV Solu-Medrol Scheduled Duonebs and Budesonide Prn Duonebs  CARDIOVASCULAR A:  ? CHF exacerbation, elevated BNP, no vascular congestion on CXR Hx: CHF, HTN, CAD  P:  Lasix dose 40 mg PO  Resume home Lipitor Continuous Telemetry monitoring  RENAL A:   Acute Renal Failure Hx: Chronic Kidney Disease, Autosomal Recessive Polycystic Kidneys  P:   Monitor I&O Follow BMP's, especially with Lasix administration Replace electrolytes as needed per Protocol Enoxaparin for VTE prophylaxis Monitor for S/Sx of bleeding Follow CBC's Transfuse for Hgb <7.0   Patient satisfied with Plan of action and management. All questions answered  Corrin Parker, M.D.  [facility]  Medical Director [place] Director [facility] Cardio-Pulmonary Department

## 2016-05-06 NOTE — Progress Notes (Signed)
[name]    MR#:  [id number]  DATE OF BIRTH:  [birth year]  SUBJECTIVE:  CHIEF COMPLAINT:   Chief Complaint  Patient presents with  . Shortness of Breath   better cough and shortness of breath, on O2 [place] 3L. REVIEW OF SYSTEMS:  Review of Systems  Constitutional: Positive for malaise/fatigue. Negative for chills and fever.  HENT: Negative for congestion.   Eyes: Negative for blurred vision and double vision.  Respiratory: Positive for cough, shortness of breath and wheezing. Negative for hemoptysis and stridor.   Cardiovascular: Negative for chest pain and leg swelling.  Gastrointestinal: Negative for abdominal pain, blood in stool, constipation, diarrhea, nausea and vomiting.  Genitourinary: Negative for hematuria.  Musculoskeletal: Negative for back pain.  Skin: Negative for itching and rash.  Neurological: Positive for weakness. Negative for dizziness, focal weakness and loss of consciousness.  Psychiatric/Behavioral: Negative for depression. The patient is not nervous/anxious.     DRUG ALLERGIES:  No Known Allergies VITALS:  Blood pressure 126/71, pulse (!) 103, temperature 98 F (36.7 C), temperature source Oral, resp. rate 18, height 5\' 5"  (1.651 m), weight 208 lb 8 oz (94.6 kg), SpO2 96 %. PHYSICAL EXAMINATION:  Physical Exam  Constitutional: She is oriented to person, place, and time and well-developed, well-nourished, and in no distress.  HENT:  Head: Normocephalic.  Mouth/Throat: Oropharynx is clear and moist.  Eyes: Conjunctivae and EOM are normal. Pupils are equal, round, and reactive to light.  Neck: Normal range of motion. Neck supple. No JVD present. No tracheal deviation present.  Cardiovascular: Normal rate, regular rhythm and normal heart sounds.  Exam reveals no gallop.   No murmur heard. Pulmonary/Chest: Effort normal. No respiratory distress. She has wheezes. She has no rales.    Abdominal: Soft. Bowel sounds are normal. She exhibits no distension. There is no tenderness.  Musculoskeletal: Normal range of motion. She exhibits no edema or tenderness.  Neurological: She is oriented to person, place, and time. No cranial nerve deficit.  Skin: No rash noted. No erythema.  Psychiatric: Affect normal.   LABORATORY PANEL:  Female CBC  Recent Labs Lab 05/06/16 0611  WBC 15.7*  HGB 9.6*  HCT 29.4*  PLT 342   ------------------------------------------------------------------------------------------------------------------ Chemistries   Recent Labs Lab 05/06/16 0611  NA 139  K 4.9  CL 103  CO2 27  GLUCOSE 206*  BUN 28*  CREATININE 1.68*  CALCIUM 8.6*   RADIOLOGY:  Dg Chest Port 1 View  Result Date: 05/06/2016 CLINICAL DATA:  76 year old female ICU patient. Follow-up chest radiograph. EXAM: PORTABLE CHEST 1 VIEW COMPARISON:  Chest radiograph dated 05/05/2016 FINDINGS: There has been interval development of increased opacity at the left lung base with partial silhouetting of the left cardiac border. Findings concerning for development of an infiltrative process involving the lingula. A small left pleural effusion with associated partial compressive atelectasis of the left lung base may be present. Right lung base densities, likely atelectatic changes. There is no pneumothorax. There is mild cardiomegaly. No acute osseous pathology. IMPRESSION: Interval development of airspace opacity involving the left mid to lower lung field likely combination of a small left pleural effusion and left lung base subsegmental atelectasis/ infiltrate as well as lingular infiltrate. Correlation with clinical exam recommended. PA and lateral views of the chest or CT may provide better evaluation. Electronically Signed   By: Anner Crete M.D.   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 Hx: COPD requiring 3L Home  O2 qhs.  Continue O2 to maintain O2 sats >88% Bipap as needed D/c IV Solu-Medrol, start prednisone tomorrow. Scheduled Duonebs and Budesonide Prn Duonebs  No CHF exacerbation (60% to 65%.), only mild elevated BNP, no vascular congestion on CXR Hx: CHF, HTN, CAD Hold lasix dose 40 mg PO  Resumed home Lipitor   Acute Renal Failure Hx: Chronic Kidney Disease, Autosomal Recessive Polycystic Kidneys Hold lasix, f/u BMP.   PT evaluation suggests home with home health.  All the records are reviewed and case discussed with Care Management/Social Worker. Management plans discussed with the patient, family and they are in agreement.  CODE STATUS: Full Code  TOTAL TIME TAKING CARE OF THIS PATIENT: 38 minutes.   More than 50% of the time was spent in counseling/coordination of care: YES  POSSIBLE D/C IN 2 DAYS, DEPENDING ON CLINICAL CONDITION.   Demetrios Loll M.D on 05/06/2016 at 2:45 PM  Between 7am to 6pm - Pager - [phone]  After 6pm go to [email or web address] - Proofreader  [facility]  Office  [phone]  CC: Primary care physician; Baltazar Apo, MD  Note: This dictation was prepared with Dragon dictation along with smaller phrase technology. Any transcriptional errors that result from this process are unintentional.

## 2016-05-06 NOTE — Progress Notes (Signed)
Patient alert. No complaints of pain or shortness of breath. She is on 3 liters of oxygen. Patient is tolerating diet and using bedside commode with no complications. Patient is being tx to floor, no tele. Report given to Butch Penny.

## 2016-05-06 NOTE — Progress Notes (Signed)
Inpatient Diabetes Program Recommendations  AACE/ADA: New Consensus Statement on Inpatient Glycemic Control (2015)  Target Ranges:  Prepandial:   less than 140 mg/dL      Peak postprandial:   less than 180 mg/dL (1-2 hours)      Critically ill patients:  140 - 180 mg/dL   Lab Results  Component Value Date   GLUCAP 195 (H) 05/06/2016   HGBA1C 6.7 (H) 03/26/2016    Review of Glycemic Control  Results for BICH, MCHANEY (MRN [id number]) as of 05/06/2016 10:21  Ref. Range 05/05/2016 08:37 05/05/2016 14:08 05/05/2016 17:21 05/05/2016 21:54 05/06/2016 07:18  Glucose-Capillary Latest Ref Range: 65 - 99 mg/dL 235 (H) 268 (H) 250 (H) 221 (H) 195 (H)    Diabetes history: Type 2 Outpatient Diabetes medications: Novolog 2-10 units tid for CBG >200mg /dl, Metformin 1000mg  bid, Glipizide 2.5mg  q day Current orders for Inpatient glycemic control: Novolog 0-9 units tid, Novolog 0-5 units qhs,  * steroids 40mg  IV q12h  Inpatient Diabetes Program Recommendations:  Since patient is on steroids, consider adding Lantus 19 units qhs (0.2 units/kg) and increasing Novolog correction to resistant correction scale 0-20 units tid (decrease as steroids are tapered)  Gentry Fitz, RN, BA, MHA, CDE Diabetes Coordinator Inpatient Diabetes Program  [phone] (Team Pager) [phone] ([place]) 05/06/2016 10:24 AM

## 2016-05-07 LAB — BASIC METABOLIC PANEL
Anion gap: 7 (ref 5–15)
BUN: 34 mg/dL — ABNORMAL HIGH (ref 6–20)
CO2: 30 mmol/L (ref 22–32)
Calcium: 9.2 mg/dL (ref 8.9–10.3)
Chloride: 106 mmol/L (ref 101–111)
Creatinine, Ser: 1.61 mg/dL — ABNORMAL HIGH (ref 0.44–1.00)
GFR calc Af Amer: 35 mL/min — ABNORMAL LOW (ref >60)
GFR calc non Af Amer: 30 mL/min — ABNORMAL LOW (ref >60)
Glucose, Bld: 154 mg/dL — ABNORMAL HIGH (ref 65–99)
Potassium: 4.2 mmol/L (ref 3.5–5.1)
Sodium: 143 mmol/L (ref 135–145)

## 2016-05-07 LAB — GLUCOSE, CAPILLARY
Glucose-Capillary: 238 mg/dL — ABNORMAL HIGH (ref 65–99)
Glucose-Capillary: 131 mg/dL — ABNORMAL HIGH (ref 65–99)
Glucose-Capillary: 304 mg/dL — ABNORMAL HIGH (ref 65–99)

## 2016-05-07 MED ORDER — PREDNISONE 10 MG PO TABS: ORAL_TABLET | ORAL | 0 refills | Status: DC

## 2016-05-07 MED ORDER — FAMOTIDINE 20 MG PO TABS: 20.0000 mg | ORAL_TABLET | Freq: Every day | ORAL | Status: DC

## 2016-05-07 NOTE — Care Management (Signed)
Discharge to home today per Dr. Bridgett Larsson. [facility] is following in the home for nursing. [place] [facility] representative updated.  Family will transport. Shelbie Ammons RN MSN CCM Care Management

## 2016-05-07 NOTE — Discharge Planning (Signed)
Patient IV removed.  DC papers given, explained and educated.  Scripts sent to [facility] in [place].  FU appts made except one, which office is closed. Patient agreed to call and set up on Monday.  RN assessment and VS revealed stability for DC to home.  When ready will wheel to front and family transporting home via car.

## 2016-05-07 NOTE — Progress Notes (Signed)
Inpatient Diabetes Program Recommendations  AACE/ADA: New Consensus Statement on Inpatient Glycemic Control (2015)  Target Ranges:  Prepandial:   less than 140 mg/dL      Peak postprandial:   less than 180 mg/dL (1-2 hours)      Critically ill patients:  140 - 180 mg/dL   Lab Results  Component Value Date   GLUCAP 131 (H) 05/07/2016   HGBA1C 6.7 (H) 03/26/2016    Review of Glycemic Control  Results for AVINA, EBERLE (MRN [id number]) as of 05/07/2016 10:08  Ref. Range 05/06/2016 11:03 05/06/2016 13:26 05/06/2016 16:48 05/06/2016 21:01 05/07/2016 07:29  Glucose-Capillary Latest Ref Range: 65 - 99 mg/dL 230 (H) 286 (H) 238 (H) 272 (H) 131 (H)    Diabetes history: Type 2 Outpatient Diabetes medications: Novolog 2-10 units tid for CBG >200mg /dl, Metformin 1000mg  bid, Glipizide 2.5mg  q day Current orders for Inpatient glycemic control: Novolog 0-15 units tid, Novolog 0-5 units qhs *oral steroids  Inpatient Diabetes Program Recommendations: IV steroids d/c yesterday and orals started today- agree with current medications for blood sugar management.   Gentry Fitz, RN, BA, MHA, CDE Diabetes Coordinator Inpatient Diabetes Program  [phone] (Team Pager) [phone] ([place]) 05/07/2016 10:11 AM

## 2016-05-07 NOTE — Care Management Important Message (Signed)
Important Message  Patient Details  Name: Mckenzie Taylor MRN: [id number] Date of Birth: [birth year]   Medicare Important Message Given:  Yes    Shelbie Ammons, RN 05/07/2016, 10:15 AM

## 2016-05-07 NOTE — Discharge Instructions (Signed)
Heart healthy and ADA diet. HHPT. O2 [place] at night and prn.

## 2016-05-07 NOTE — Discharge Summary (Signed)
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 Acute Renal Failure on CKD stage 3. SECONDARY DIAGNOSIS:   Past Medical History:  Diagnosis Date  . Autosomal recessive polycystic kidneys   . Avascular necrosis of hip (HCC)    bilateral  . CAD (coronary artery disease)    mild  . CHF (congestive heart failure) ([place])   . Chronic kidney disease    cyst  . COPD (chronic obstructive pulmonary disease) ([place])   . Diabetes type 2, controlled ([place])   . GERD (gastroesophageal reflux disease)   . HTN (hypertension)   . Hypothyroidism    subclinical. low TSH, normal thyroid panel. biopsy 2010  . 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 Hx: COPD requiring 3L Home O2 qhs.  Weaned off O2 [place].  Treated with IV Solu-Medrol, taper prednisone. prn Duonebs and Budesonide Prn Duonebs  No CHF exacerbation (60% to 65%.), only mild elevated BNP, no vascular congestion on CXR Hx: CHF, HTN, CAD Hold lasix dose 40 mg PO  Resumed home Lipitor  Acute Renal Failure on CKD stage 3. Hx: Chronic Kidney Disease, Autosomal Recessive Polycystic Kidneys Hold lasix, stable. PT evaluation suggests home with home health.   DISCHARGE CONDITIONS:  Stable, discharge to home with home health and PT today. CONSULTS OBTAINED:   DRUG ALLERGIES:  No Known Allergies DISCHARGE MEDICATIONS:   Allergies as of 05/07/2016   No Known Allergies     Medication List    TAKE these medications   amLODipine 10 MG tablet Commonly known as:  NORVASC Take 10 mg by mouth daily.   atorvastatin 40 MG tablet Commonly known as:  LIPITOR Take 40 mg by mouth daily.   enalapril 20 MG tablet Commonly known as:  VASOTEC Take 20 mg by mouth 2 (two) times daily.   FLUoxetine 40 MG capsule Commonly known as:  PROZAC Take 40 mg by mouth.   furosemide 40 MG tablet Commonly known as:  LASIX Take 40 mg by mouth daily.   glipiZIDE 2.5 MG 24 hr tablet Commonly known as:  GLUCOTROL XL Take 2.5 mg by mouth daily.   ibuprofen 800 MG tablet Commonly known as:  ADVIL,MOTRIN Take 800 mg by mouth every 8 (eight) hours as needed.   INCRUSE ELLIPTA 62.5 MCG/INH Aepb Generic drug:  umeclidinium bromide Inhale 1 puff into the lungs daily.   ipratropium-albuterol 0.5-2.5 (3) MG/3ML Soln Commonly known as:  DUONEB Take 3 mLs by nebulization 4 (four) times daily.   metFORMIN 500 MG tablet Commonly known as:  GLUCOPHAGE Take 1,000 mg by mouth 2 (two) times daily.   NOVOLOG FLEXPEN 100 UNIT/ML FlexPen Generic drug:  insulin aspart Inject 2-10 Units into the skin 3 (three) times daily as needed. Sliding scale if blood sugar is above 200.   omeprazole 20 MG capsule Commonly known as:  PRILOSEC Take 20 mg by mouth daily.   oxyCODONE-acetaminophen 10-325 MG tablet Commonly known as:  PERCOCET Take 1 tablet by mouth 3 (three)  times daily as needed.   predniSONE 10 MG tablet Commonly known as:  DELTASONE 40 mg po daily for 2 days, 20 mg po daily for 2 days, 10 mg po daily for 2 days. Start taking on:  05/08/2016   RA VITAMIN B-12 TR 1000 MCG Tbcr Generic drug:  Cyanocobalamin Take 1,000 mcg by mouth daily.   tiZANidine 4 MG capsule Commonly known as:  ZANAFLEX Take 4 mg by mouth 3 (three) times daily as needed for muscle spasms.   VENTOLIN HFA 108 (90 Base) MCG/ACT inhaler Generic drug:  albuterol Inhale 2 puffs into the lungs every 6 (six) hours as needed.    albuterol (2.5 MG/3ML) 0.083% nebulizer solution Commonly known as:  PROVENTIL Take by nebulization every 4 (four) hours as needed for wheezing or shortness of breath.   VITAMIN D-1000 MAX ST 1000 units tablet Generic drug:  Cholecalciferol Take 1,000 Units by mouth daily.        DISCHARGE INSTRUCTIONS:  See AVS.  If you experience worsening of your admission symptoms, develop shortness of breath, life threatening emergency, suicidal or homicidal thoughts you must seek medical attention immediately by calling 911 or calling your MD immediately  if symptoms less severe.  You Must read complete instructions/literature along with all the possible adverse reactions/side effects for all the Medicines you take and that have been prescribed to you. Take any new Medicines after you have completely understood and accpet all the possible adverse reactions/side effects.   Please note  You were cared for by a hospitalist during your hospital stay. If you have any questions about your discharge medications or the care you received while you were in the hospital after you are discharged, you can call the unit and asked to speak with the hospitalist on call if the hospitalist that took care of you is not available. Once you are discharged, your primary care physician will handle any further medical issues. Please note that NO REFILLS for any discharge medications will be authorized once you are discharged, as it is imperative that you return to your primary care physician (or establish a relationship with a primary care physician if you do not have one) for your aftercare needs so that they can reassess your need for medications and monitor your lab values.    On the day of Discharge:  VITAL SIGNS:  Blood pressure (!) 142/78, pulse (!) 113, temperature 99.1 F (37.3 C), temperature source Oral, resp. rate 20, height 5\' 5"  (1.651 m), weight 206 lb 4.8 oz (93.6 kg), SpO2 95 %. PHYSICAL EXAMINATION:    GENERAL:  76 y.o.-year-old patient lying in the bed with no acute distress. Obese. EYES: Pupils equal, round, reactive to light and accommodation. No scleral icterus. Extraocular muscles intact.  HEENT: Head atraumatic, normocephalic. Oropharynx and nasopharynx clear.  NECK:  Supple, no jugular venous distention. No thyroid enlargement, no tenderness.  LUNGS: Normal breath sounds bilaterally, no wheezing, rales,rhonchi or crepitation. No use of accessory muscles of respiration.  CARDIOVASCULAR: S1, S2 normal. No murmurs, rubs, or gallops.  ABDOMEN: Soft, non-tender, non-distended. Bowel sounds present. No organomegaly or mass.  EXTREMITIES: No pedal edema, cyanosis, or clubbing.  NEUROLOGIC: Cranial nerves II through XII are intact. Muscle strength 5/5 in all extremities. Sensation intact. Gait not checked.  PSYCHIATRIC: The patient is alert and oriented x 3.  SKIN: No obvious rash, lesion, or ulcer.  DATA REVIEW:   CBC  Recent Labs Lab 05/06/16 0611  WBC 15.7*  HGB 9.6*  HCT  29.4*  PLT 342    Chemistries   Recent Labs Lab 05/07/16 0412  NA 143  K 4.2  CL 106  CO2 30  GLUCOSE 154*  BUN 34*  CREATININE 1.61*  CALCIUM 9.2     Microbiology Results  Results for orders placed or performed during the hospital encounter of 05/05/16  MRSA PCR Screening     Status: None   Collection Time: 05/05/16  8:48 AM  Result Value Ref Range Status   MRSA by PCR NEGATIVE NEGATIVE Final    Comment:        The GeneXpert MRSA Assay (FDA approved for NASAL specimens only), is one component of a comprehensive MRSA colonization surveillance program. It is not intended to diagnose MRSA infection nor to guide or monitor treatment for MRSA infections.     RADIOLOGY:  No results found.   Management plans discussed with the patient, family and they are in agreement.  CODE STATUS: Full Code   TOTAL TIME TAKING CARE OF THIS PATIENT: 35 minutes.    Demetrios Loll M.D on 05/07/2016 at  4:50 PM  Between 7am to 6pm - Pager - [phone]  After 6pm go to [email or web address] - Proofreader  [facility]  Office  [phone]  CC: Primary care physician; Baltazar Apo, MD   Note: This dictation was prepared with Dragon dictation along with smaller phrase technology. Any transcriptional errors that result from this process are unintentional.

## 2016-05-07 NOTE — Care Management (Signed)
Admitted to [facility] with the diagnosis of respiratory distress. Mckenzie Taylor lives in the home. Son is Vincente Poli [phone]). Seen Dr. Veda Canning at [facility] a couple of months ago. [facility] in the past for [name]. [name] is in the home currently. Will update [facility]. No skilled nursing facility. Home oxygen per [facility] x 18 years. Wears 3 liters at night per nasal cannula only. Rolling walker, cane, wheelchair, and nebulizer in the home. Requested shower chair. Discussed that Medicare wouldn't pay for shower chair. Possibly could get one at [facility]. Prescriptions are filled at [facility] in [place], [place] will transport. Requested BiPap in the home. Karl Pock, [facility] representative updated.  Shelbie Ammons RN MSN CCM Care Management

## 2016-05-07 NOTE — Progress Notes (Signed)
PHARMACIST - PHYSICIAN COMMUNICATION  DR:   Bridgett Larsson  CONCERNING: IV to Oral Route Change Policy  RECOMMENDATION: This patient is receiving Famotadine by the intravenous route.  Based on criteria approved by the Pharmacy and Therapeutics Committee, the intravenous medication(s) is/are being converted to the equivalent oral dose form(s).   DESCRIPTION: These criteria include:  The patient is eating (either orally or via tube) and/or has been taking other orally administered medications for a least 24 hours  The patient has no evidence of active gastrointestinal bleeding or impaired GI absorption (gastrectomy, short bowel, patient on TNA or NPO).  If you have questions about this conversion, please contact the Pharmacy Department  []   [phone] )  [facility] [x]   [phone] )  [facility] []   [phone] )  [facility] []   [phone] )  [facility] []   [phone] )  Industry, [facility] 05/07/2016 11:57 AM

## 2016-05-13 ENCOUNTER — Inpatient Hospital Stay: Payer: Medicare Other | Admitting: Pulmonary Disease

## 2016-06-24 ENCOUNTER — Inpatient Hospital Stay: Payer: Medicare Other | Admitting: Pulmonary Disease

## 2016-08-14 ENCOUNTER — Emergency Department: Payer: Medicare Other

## 2016-08-14 ENCOUNTER — Encounter: Payer: Self-pay | Admitting: Emergency Medicine

## 2016-08-14 ENCOUNTER — Inpatient Hospital Stay
Admission: EM | Admit: 2016-08-14 | Discharge: 2016-08-19 | DRG: 291 | Disposition: A | Discharge status: Home / Self Care | Payer: Medicare Other | Attending: Internal Medicine | Admitting: Internal Medicine

## 2016-08-14 DIAGNOSIS — I2510 Atherosclerotic heart disease of native coronary artery without angina pectoris: Secondary | ICD-10-CM | POA: Diagnosis present

## 2016-08-14 DIAGNOSIS — I5021 Acute systolic (congestive) heart failure: Secondary | ICD-10-CM | POA: Diagnosis present

## 2016-08-14 DIAGNOSIS — I5023 Acute on chronic systolic (congestive) heart failure: Secondary | ICD-10-CM | POA: Diagnosis present

## 2016-08-14 DIAGNOSIS — Z96649 Presence of unspecified artificial hip joint: Secondary | ICD-10-CM | POA: Diagnosis present

## 2016-08-14 DIAGNOSIS — Z87891 Personal history of nicotine dependence: Secondary | ICD-10-CM | POA: Diagnosis not present

## 2016-08-14 DIAGNOSIS — Z794 Long term (current) use of insulin: Secondary | ICD-10-CM

## 2016-08-14 DIAGNOSIS — I10 Essential (primary) hypertension: Secondary | ICD-10-CM | POA: Diagnosis present

## 2016-08-14 DIAGNOSIS — E1122 Type 2 diabetes mellitus with diabetic chronic kidney disease: Secondary | ICD-10-CM | POA: Diagnosis present

## 2016-08-14 DIAGNOSIS — K436 Other and unspecified ventral hernia with obstruction, without gangrene: Secondary | ICD-10-CM | POA: Diagnosis present

## 2016-08-14 DIAGNOSIS — I130 Hypertensive heart and chronic kidney disease with heart failure and stage 1 through stage 4 chronic kidney disease, or unspecified chronic kidney disease: Principal | ICD-10-CM | POA: Diagnosis present

## 2016-08-14 DIAGNOSIS — J189 Pneumonia, unspecified organism: Secondary | ICD-10-CM | POA: Diagnosis present

## 2016-08-14 DIAGNOSIS — Z79899 Other long term (current) drug therapy: Secondary | ICD-10-CM | POA: Diagnosis not present

## 2016-08-14 DIAGNOSIS — J440 Chronic obstructive pulmonary disease with acute lower respiratory infection: Secondary | ICD-10-CM | POA: Diagnosis present

## 2016-08-14 DIAGNOSIS — I509 Heart failure, unspecified: Secondary | ICD-10-CM

## 2016-08-14 DIAGNOSIS — K219 Gastro-esophageal reflux disease without esophagitis: Secondary | ICD-10-CM | POA: Diagnosis present

## 2016-08-14 DIAGNOSIS — J441 Chronic obstructive pulmonary disease with (acute) exacerbation: Secondary | ICD-10-CM | POA: Diagnosis present

## 2016-08-14 DIAGNOSIS — N183 Chronic kidney disease, stage 3 (moderate): Secondary | ICD-10-CM | POA: Diagnosis present

## 2016-08-14 DIAGNOSIS — R0603 Acute respiratory distress: Secondary | ICD-10-CM

## 2016-08-14 DIAGNOSIS — E119 Type 2 diabetes mellitus without complications: Secondary | ICD-10-CM

## 2016-08-14 DIAGNOSIS — E039 Hypothyroidism, unspecified: Secondary | ICD-10-CM | POA: Diagnosis present

## 2016-08-14 DIAGNOSIS — N179 Acute kidney failure, unspecified: Secondary | ICD-10-CM | POA: Diagnosis present

## 2016-08-14 DIAGNOSIS — D631 Anemia in chronic kidney disease: Secondary | ICD-10-CM | POA: Diagnosis present

## 2016-08-14 DIAGNOSIS — K439 Ventral hernia without obstruction or gangrene: Secondary | ICD-10-CM

## 2016-08-14 DIAGNOSIS — Q613 Polycystic kidney, unspecified: Secondary | ICD-10-CM

## 2016-08-14 LAB — BASIC METABOLIC PANEL
Anion gap: 11 (ref 5–15)
BUN: 17 mg/dL (ref 6–20)
CO2: 27 mmol/L (ref 22–32)
Calcium: 9 mg/dL (ref 8.9–10.3)
Chloride: 104 mmol/L (ref 101–111)
Creatinine, Ser: 1.62 mg/dL — ABNORMAL HIGH (ref 0.44–1.00)
GFR calc Af Amer: 35 mL/min — ABNORMAL LOW (ref >60)
GFR calc non Af Amer: 30 mL/min — ABNORMAL LOW (ref >60)
Glucose, Bld: 219 mg/dL — ABNORMAL HIGH (ref 65–99)
Potassium: 3.7 mmol/L (ref 3.5–5.1)
Sodium: 142 mmol/L (ref 135–145)

## 2016-08-14 LAB — CBC WITH DIFFERENTIAL/PLATELET
Basophils Absolute: 0.1 10*3/uL (ref 0–0.1)
Basophils Relative: 1 %
Eosinophils Absolute: 0.1 10*3/uL (ref 0–0.7)
Eosinophils Relative: 1 %
HCT: 32.1 % — ABNORMAL LOW (ref 35.0–47.0)
Hemoglobin: 10.1 g/dL — ABNORMAL LOW (ref 12.0–16.0)
Lymphocytes Relative: 17 %
Lymphs Abs: 2.4 10*3/uL (ref 1.0–3.6)
MCH: 27.3 pg (ref 26.0–34.0)
MCHC: 31.6 g/dL — ABNORMAL LOW (ref 32.0–36.0)
MCV: 86.5 fL (ref 80.0–100.0)
Monocytes Absolute: 0.9 10*3/uL (ref 0.2–0.9)
Monocytes Relative: 6 %
Neutro Abs: 10.5 10*3/uL — ABNORMAL HIGH (ref 1.4–6.5)
Neutrophils Relative %: 75 %
Platelets: 306 10*3/uL (ref 150–440)
RBC: 3.72 MIL/uL — ABNORMAL LOW (ref 3.80–5.20)
RDW: 17.9 % — ABNORMAL HIGH (ref 11.5–14.5)
WBC: 13.9 10*3/uL — ABNORMAL HIGH (ref 3.6–11.0)

## 2016-08-14 LAB — BRAIN NATRIURETIC PEPTIDE: B Natriuretic Peptide: 317 pg/mL — ABNORMAL HIGH (ref 0.0–100.0)

## 2016-08-14 LAB — TROPONIN I: Troponin I: <0.03 ng/mL (ref <0.03)

## 2016-08-14 LAB — GLUCOSE, CAPILLARY: Glucose-Capillary: 315 mg/dL — ABNORMAL HIGH (ref 65–99)

## 2016-08-14 MED ORDER — ACETAMINOPHEN 650 MG RE SUPP: 650.0000 mg | Freq: Four times a day (QID) | RECTAL | Status: DC | PRN

## 2016-08-14 MED ORDER — OXYCODONE-ACETAMINOPHEN 10-325 MG PO TABS: 1.0000 | ORAL_TABLET | Freq: Three times a day (TID) | ORAL | Status: DC | PRN

## 2016-08-14 MED ORDER — NITROGLYCERIN 2 % TD OINT
1.0000 [in_us] | TOPICAL_OINTMENT | Freq: Once | TRANSDERMAL | Status: AC
  Administered 2016-08-14: 1 [in_us] via TOPICAL

## 2016-08-14 MED ORDER — NITROGLYCERIN 2 % TD OINT
TOPICAL_OINTMENT | TRANSDERMAL | Status: AC
  Filled 2016-08-14: qty 1

## 2016-08-14 MED ORDER — AMLODIPINE BESYLATE 10 MG PO TABS
10.0000 mg | ORAL_TABLET | Freq: Every day | ORAL | Status: DC
  Administered 2016-08-15 – 2016-08-19 (×5): 10 mg via ORAL
  Filled 2016-08-14 (×5): qty 1

## 2016-08-14 MED ORDER — INSULIN ASPART 100 UNIT/ML ~~LOC~~ SOLN
0.0000 [IU] | Freq: Every day | SUBCUTANEOUS | Status: DC
  Administered 2016-08-15: 4 [IU] via SUBCUTANEOUS
  Administered 2016-08-15: 3 [IU] via SUBCUTANEOUS
  Administered 2016-08-16: 2 [IU] via SUBCUTANEOUS
  Filled 2016-08-14 (×2): qty 4
  Filled 2016-08-14: qty 2
  Filled 2016-08-14: qty 1

## 2016-08-14 MED ORDER — ATORVASTATIN CALCIUM 20 MG PO TABS
40.0000 mg | ORAL_TABLET | Freq: Every day | ORAL | Status: DC
  Administered 2016-08-15 – 2016-08-19 (×5): 40 mg via ORAL
  Filled 2016-08-14 (×5): qty 2

## 2016-08-14 MED ORDER — OXYCODONE HCL 5 MG PO TABS
5.0000 mg | ORAL_TABLET | Freq: Three times a day (TID) | ORAL | Status: DC | PRN
  Administered 2016-08-16 – 2016-08-19 (×4): 5 mg via ORAL
  Filled 2016-08-14 (×4): qty 1

## 2016-08-14 MED ORDER — ONDANSETRON HCL 4 MG PO TABS: 4.0000 mg | ORAL_TABLET | Freq: Four times a day (QID) | ORAL | Status: DC | PRN

## 2016-08-14 MED ORDER — IPRATROPIUM-ALBUTEROL 0.5-2.5 (3) MG/3ML IN SOLN
3.0000 mL | Freq: Four times a day (QID) | RESPIRATORY_TRACT | Status: DC
  Administered 2016-08-15 (×2): 3 mL via RESPIRATORY_TRACT
  Filled 2016-08-14 (×2): qty 3

## 2016-08-14 MED ORDER — FUROSEMIDE 10 MG/ML IJ SOLN
40.0000 mg | Freq: Once | INTRAMUSCULAR | Status: AC
  Administered 2016-08-15: 40 mg via INTRAVENOUS
  Filled 2016-08-14: qty 4

## 2016-08-14 MED ORDER — FUROSEMIDE 40 MG PO TABS
40.0000 mg | ORAL_TABLET | Freq: Every day | ORAL | Status: DC
  Administered 2016-08-15 – 2016-08-16 (×2): 40 mg via ORAL
  Filled 2016-08-14 (×2): qty 1

## 2016-08-14 MED ORDER — HEPARIN SODIUM (PORCINE) 5000 UNIT/ML IJ SOLN
5000.0000 [IU] | Freq: Three times a day (TID) | INTRAMUSCULAR | Status: DC
  Administered 2016-08-15 – 2016-08-19 (×11): 5000 [IU] via SUBCUTANEOUS
  Filled 2016-08-14 (×11): qty 1

## 2016-08-14 MED ORDER — FLUOXETINE HCL 20 MG PO CAPS
40.0000 mg | ORAL_CAPSULE | Freq: Every day | ORAL | Status: DC
  Administered 2016-08-15 – 2016-08-19 (×5): 40 mg via ORAL
  Filled 2016-08-14 (×5): qty 2

## 2016-08-14 MED ORDER — ENALAPRIL MALEATE 10 MG PO TABS
20.0000 mg | ORAL_TABLET | Freq: Two times a day (BID) | ORAL | Status: DC
  Administered 2016-08-15 – 2016-08-16 (×4): 20 mg via ORAL
  Filled 2016-08-14 (×4): qty 2

## 2016-08-14 MED ORDER — UMECLIDINIUM BROMIDE 62.5 MCG/INH IN AEPB
1.0000 | INHALATION_SPRAY | Freq: Every day | RESPIRATORY_TRACT | Status: DC
  Administered 2016-08-15 – 2016-08-19 (×4): 1 via RESPIRATORY_TRACT
  Filled 2016-08-14: qty 7

## 2016-08-14 MED ORDER — FUROSEMIDE 10 MG/ML IJ SOLN
40.0000 mg | Freq: Once | INTRAMUSCULAR | Status: AC
  Administered 2016-08-14: 40 mg via INTRAVENOUS
  Filled 2016-08-14: qty 4

## 2016-08-14 MED ORDER — LABETALOL HCL 5 MG/ML IV SOLN: 10.0000 mg | INTRAVENOUS | Status: DC | PRN

## 2016-08-14 MED ORDER — ACETAMINOPHEN 325 MG PO TABS: 650.0000 mg | ORAL_TABLET | Freq: Four times a day (QID) | ORAL | Status: DC | PRN

## 2016-08-14 MED ORDER — OXYCODONE-ACETAMINOPHEN 5-325 MG PO TABS
1.0000 | ORAL_TABLET | Freq: Three times a day (TID) | ORAL | Status: DC | PRN
  Administered 2016-08-18 – 2016-08-19 (×2): 1 via ORAL
  Filled 2016-08-14 (×2): qty 1

## 2016-08-14 MED ORDER — PANTOPRAZOLE SODIUM 40 MG PO TBEC
40.0000 mg | DELAYED_RELEASE_TABLET | Freq: Every day | ORAL | Status: DC
  Administered 2016-08-15 – 2016-08-19 (×5): 40 mg via ORAL
  Filled 2016-08-14 (×5): qty 1

## 2016-08-14 MED ORDER — ONDANSETRON HCL 4 MG/2ML IJ SOLN: 4.0000 mg | Freq: Four times a day (QID) | INTRAMUSCULAR | Status: DC | PRN

## 2016-08-14 MED ORDER — INSULIN ASPART 100 UNIT/ML ~~LOC~~ SOLN: 0.0000 [IU] | Freq: Three times a day (TID) | SUBCUTANEOUS | Status: DC

## 2016-08-14 NOTE — ED Provider Notes (Addendum)
[facility] Emergency Department Provider Note  ____________________________________________   I have reviewed the triage vital signs and the nursing notes.   HISTORY  Chief Complaint Shortness of Breath    HPI Mckenzie Taylor is a 76 y.o. female who presents today complaining of shortness of breath per patient is a history of COPD and CHF. She states her legs have been swelling last couple days. She has had some cough productive of white sputum. She denies any chest pain. She is on 3 L home oxygen. It was insufficient today. Patient denies any fever.She states it feels like her COPD and her heart failure acting up again. Administration is in respiratory distress, history is somewhat limited  Past Medical History:  Diagnosis Date  . Autosomal recessive polycystic kidneys   . Avascular necrosis of hip (HCC)    bilateral  . CAD (coronary artery disease)    mild  . CHF (congestive heart failure) ([place])   . Chronic kidney disease    cyst  . COPD (chronic obstructive pulmonary disease) ([place])   . Diabetes type 2, controlled ([place])   . GERD (gastroesophageal reflux disease)   . HTN (hypertension)   . Hypothyroidism    subclinical. low TSH, normal thyroid panel. biopsy 2010  . Vitamin B12 deficiency     Patient Active Problem List   Diagnosis Date Noted  . Respiratory distress 05/05/2016  . Acute respiratory failure ([place]) 03/24/2016  . 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 TOBACCO ABUSE 12/17/2008  . Coronary atherosclerosis of native coronary artery 12/17/2008  . 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 CLAUDICATION, INTERMITTENT 12/17/2008    Past Surgical History:  Procedure Laterality Date  . CHOLECYSTECTOMY    . hip replacement     bilateral-secondary to avascular necrosis  . right eye lens replacement    . ULNAR NERVE REPAIR     bilateral  . VESICOVAGINAL FISTULA CLOSURE W/ TAH      Prior to Admission  medications   Medication Sig Start Date End Date Taking? Authorizing Provider  albuterol (PROVENTIL) (2.5 MG/3ML) 0.083% nebulizer solution Take by nebulization every 4 (four) hours as needed for wheezing or shortness of breath.    [provider]  albuterol (VENTOLIN HFA) 108 (90 BASE) MCG/ACT inhaler Inhale 2 puffs into the lungs every 6 (six) hours as needed.      [provider]  amLODipine (NORVASC) 10 MG tablet Take 10 mg by mouth daily.      [provider]  atorvastatin (LIPITOR) 40 MG tablet Take 40 mg by mouth daily.    [provider]  Cholecalciferol (VITAMIN D-1000 MAX ST) 1000 units tablet Take 1,000 Units by mouth daily. 09/14/11   [provider]  Cyanocobalamin (RA VITAMIN B-12 TR) 1000 MCG TBCR Take 1,000 mcg by mouth daily.    [provider]  enalapril (VASOTEC) 20 MG tablet Take 20 mg by mouth 2 (two) times daily.      [provider]  FLUoxetine (PROZAC) 40 MG capsule Take 40 mg by mouth.    [provider]  furosemide (LASIX) 40 MG tablet Take 40 mg by mouth daily. 04/06/16   [provider]  glipiZIDE (GLUCOTROL XL) 2.5 MG 24 hr tablet Take 2.5 mg by mouth daily.     [provider]  ibuprofen (ADVIL,MOTRIN) 800 MG tablet Take 800 mg by mouth every 8 (eight) hours as needed.      [provider]  ipratropium-albuterol (DUONEB)  0.5-2.5 (3) MG/3ML SOLN Take 3 mLs by nebulization 4 (four) times daily. 03/28/16   Vaughan Basta, MD  metFORMIN (GLUCOPHAGE) 500 MG tablet Take 1,000 mg by mouth 2 (two) times daily. 04/06/16   [provider]  NOVOLOG FLEXPEN 100 UNIT/ML FlexPen Inject 2-10 Units into the skin 3 (three) times daily as needed. Sliding scale if blood sugar is above 200. 04/02/16   [provider]  omeprazole (PRILOSEC) 20 MG capsule Take 20 mg by mouth daily.    [provider]  oxyCODONE-acetaminophen (PERCOCET) 10-325 MG tablet Take 1 tablet  by mouth 3 (three) times daily as needed. 04/21/16   [provider]  predniSONE (DELTASONE) 10 MG tablet 40 mg po daily for 2 days, 20 mg po daily for 2 days, 10 mg po daily for 2 days. 05/08/16   Demetrios Loll, MD  tiZANidine (ZANAFLEX) 4 MG capsule Take 4 mg by mouth 3 (three) times daily as needed for muscle spasms.    [provider]  umeclidinium bromide (INCRUSE ELLIPTA) 62.5 MCG/INH AEPB Inhale 1 puff into the lungs daily.    [provider]    Allergies Patient has no known allergies.  Family History  Problem Relation Age of Onset  . Cancer Father        lung  . COPD Mother   . Heart disease Mother   . Heart disease Maternal Uncle     Social History Social History  Substance Use Topics  . Smoking status: Former Smoker    Packs/day: 2.00  . Smokeless tobacco: Never Used     Comment: 1 ppd - 50 years   . Alcohol use No    Review of Systems Constitutional: No fever/chills Eyes: No visual changes. ENT: No sore throat. No stiff neck no neck pain Cardiovascular: Denies chest pain. Respiratory: Positive shortness of breath. Gastrointestinal:   no vomiting.  No diarrhea.  No constipation. Genitourinary: Negative for dysuria. Musculoskeletal: Positive lower extremity swelling Skin: Negative for rash. Neurological: Negative for severe headaches, focal weakness or numbness.   ____________________________________________   PHYSICAL EXAM:  VITAL SIGNS: ED Triage Vitals  Enc Vitals Group     BP 08/14/16 2026 (!) 191/89     Pulse Rate 08/14/16 2026 (!) 130     Resp 08/14/16 2026 (!) 28     Temp 08/14/16 2026 98.6 F (37 C)     Temp Source 08/14/16 2026 Oral     SpO2 08/14/16 2026 95 %     Weight 08/14/16 2030 223 lb (101.2 kg)     Height 08/14/16 2030 5\' 6"  (1.676 m)     Head Circumference --      Peak Flow --      Pain Score --      Pain Loc --      Pain Edu? --      Excl. in [place]? --     Constitutional: Alert and oriented. In obvious  respiratory distress initially Eyes: Conjunctivae are normal Head: Atraumatic HEENT: No congestion/rhinnorhea. Mucous membranes are moist.  Oropharynx non-erythematous Neck:   Nontender with no meningismus, no masses, no stridor Cardiovascular: Normal rate, regular rhythm. Grossly normal heart sounds.  Good peripheral circulation. Respiratory: Medicine the bases occasional bibasilar rails no rhonchi Abdominal: Soft and nontender. No distention. No guarding no rebound Back:  There is no focal tenderness or step off.  there is no midline tenderness there are no lesions noted. there is no CVA tenderness Musculoskeletal: No lower extremity tenderness, no  upper extremity tenderness. No joint effusions, no DVT signs strong distal pulses bilateral symmetric pitting edema Neurologic:  Normal speech and language. No gross focal neurologic deficits are appreciated.  Skin:  Skin is warm, dry and intact. No rash noted. Psychiatric: Mood and affect are somewhat anxious. Speech and behavior are normal.  ____________________________________________   LABS (all labs ordered are listed, but only abnormal results are displayed)  Labs Reviewed  CBC WITH DIFFERENTIAL/PLATELET - Abnormal; Notable for the following:       Result Value   WBC 13.9 (*)    RBC 3.72 (*)    Hemoglobin 10.1 (*)    HCT 32.1 (*)    MCHC 31.6 (*)    RDW 17.9 (*)    Neutro Abs 10.5 (*)    All other components within normal limits  BASIC METABOLIC PANEL - Abnormal; Notable for the following:    Glucose, Bld 219 (*)    Creatinine, Ser 1.62 (*)    GFR calc non Af Amer 30 (*)    GFR calc Af Amer 35 (*)    All other components within normal limits  BRAIN NATRIURETIC PEPTIDE - Abnormal; Notable for the following:    B Natriuretic Peptide 317.0 (*)    All other components within normal limits  CULTURE, BLOOD (ROUTINE X 2)  CULTURE, BLOOD (ROUTINE X 2)  TROPONIN I   ____________________________________________  EKG  I  personally interpreted any EKGs ordered by me or triage Normal sinus rhythm rate 123 mild tachycardia noted, no acute ST elevation or depression nonspecific ST changes, respiratory distress limits EKG, ____________________________________________  RADIOLOGY  I reviewed any imaging ordered by me or triage that were performed during my shift and, if possible, patient and/or family made aware of any abnormal findings. ____________________________________________   PROCEDURES  Procedure(s) performed: None  Procedures  Critical Care performed: CRITICAL CARE Performed by: Schuyler Amor   Total critical care time: 52 minutes  Critical care time was exclusive of separately billable procedures and treating other patients.  Critical care was necessary to treat or prevent imminent or life-threatening deterioration.  Critical care was time spent personally by me on the following activities: development of treatment plan with patient and/or surrogate as well as nursing, discussions with consultants, evaluation of patient's response to treatment, examination of patient, obtaining history from patient or surrogate, ordering and performing treatments and interventions, ordering and review of laboratory studies, ordering and review of radiographic studies, pulse oximetry and re-evaluation of patient's condition.   ____________________________________________   INITIAL IMPRESSION / ASSESSMENT AND PLAN / ED COURSE  Pertinent labs & imaging results that were available during my care of the patient were reviewed by me and considered in my medical decision making (see chart for details).  Patient with multiple different reasons to have respiratory distress presents in respiratory distress. We did immediately place her on BiPAP. I've given her nitroglycerin, we have been ordered Lasix. Patient is feeling somewhat better. Chest x-ray does not show any definitive infiltrate. White count is borderline  elevated but it is usually at that level or higher. She has no fever here. She is feeling better on BiPAP. It is my thought this is most likely COPD and CHF. Blood cultures have been obtained. Patient is much more comfortable at this time on BiPAP. She is breathing much more easily. We will admit her to the hospital.  ----------------------------------------- 11:24 PM on 08/14/2016 -----------------------------------------  Patient did much better on BiPAP after a prolonged period of observation  we did take her off the BiPAP and she is doing better. No evidence of acute pneumonia white count is as noted last elevated. Hospital's would prefer not to give antibiotics at this time. BNP is up we have given her Lasix she is feeling much better and she went upstairs on nasal cannula.   ____________________________________________   FINAL CLINICAL IMPRESSION(S) / ED DIAGNOSES  Final diagnoses:  None      This chart was dictated using voice recognition software.  Despite best efforts to proofread,  errors can occur which can change meaning.      Schuyler Amor, MD 08/14/16 2115    Schuyler Amor, MD 08/14/16 [id number]

## 2016-08-14 NOTE — ED Triage Notes (Signed)
Pt presents to ED c/o SOB starting 3 days ago and gradually worsening. Hx COPD, CHF, HTN, DM type 2. EMS report wheezing in all fields, pt given 10mg  albuterol, x1 atrovent, 125mg  solu-medrol PTA. Rhonchi audible on arrival. +yellow sputum. O2 sat 90% on RA, pt wears 3L chronically.

## 2016-08-14 NOTE — H&P (Signed)
[place] [facility] [place] NAME: Mckenzie Taylor    MR#:  [id number]  DATE OF BIRTH:  03-17-1940  DATE OF ADMISSION:  08/14/2016  PRIMARY CARE PHYSICIAN: Denton Lank, MD   REQUESTING/REFERRING PHYSICIAN: Burlene Arnt, MD  CHIEF COMPLAINT:   Chief Complaint  Patient presents with  . Shortness of Breath    HISTORY OF PRESENT ILLNESS:  Mckenzie Taylor  is a 76 y.o. female who presents with 3-4 days progressive shortness of breath, increased cough with sputum production. Here in the ED tonight she was somewhat hypoxic and working hard to breathe, she required BiPAP for some period of time. But was able to be weaned.  Initial workup was consistent with mild heart failure exacerbation, and imaging which showed likely pneumonia. Hospitalists were called for admission  PAST MEDICAL HISTORY:   Past Medical History:  Diagnosis Date  . Autosomal recessive polycystic kidneys   . Avascular necrosis of hip (HCC)    bilateral  . CAD (coronary artery disease)    mild  . CHF (congestive heart failure) ([place])   . Chronic kidney disease    cyst  . COPD (chronic obstructive pulmonary disease) ([place])   . Diabetes type 2, controlled ([place])   . GERD (gastroesophageal reflux disease)   . HTN (hypertension)   . Hypothyroidism    subclinical. low TSH, normal thyroid panel. biopsy 2010  . Vitamin B12 deficiency     PAST SURGICAL HISTORY:   Past Surgical History:  Procedure Laterality Date  . CHOLECYSTECTOMY    . hip replacement     bilateral-secondary to avascular necrosis  . right eye lens replacement    . ULNAR NERVE REPAIR     bilateral  . VESICOVAGINAL FISTULA CLOSURE W/ TAH      SOCIAL HISTORY:   Social History  Substance Use Topics  . Smoking status: Former Smoker    Packs/day: 2.00  . Smokeless tobacco: Never Used     Comment: 1 ppd - 50 years   . Alcohol use No    FAMILY HISTORY:   Family History  Problem Relation Age of Onset   . Cancer Father        lung  . COPD Mother   . Heart disease Mother   . Heart disease Maternal Uncle     DRUG ALLERGIES:  No Known Allergies  MEDICATIONS AT HOME:   Prior to Admission medications   Medication Sig Start Date End Date Taking? Authorizing Provider  albuterol (PROVENTIL) (2.5 MG/3ML) 0.083% nebulizer solution Take by nebulization every 4 (four) hours as needed for wheezing or shortness of breath.   Yes [provider]  albuterol (VENTOLIN HFA) 108 (90 BASE) MCG/ACT inhaler Inhale 2 puffs into the lungs every 6 (six) hours as needed.     Yes [provider]  amLODipine (NORVASC) 10 MG tablet Take 10 mg by mouth daily.     Yes [provider]  atorvastatin (LIPITOR) 40 MG tablet Take 40 mg by mouth daily.   Yes [provider]  enalapril (VASOTEC) 20 MG tablet Take 20 mg by mouth 2 (two) times daily.     Yes [provider]  FLUoxetine (PROZAC) 40 MG capsule Take 40 mg by mouth.   Yes [provider]  furosemide (LASIX) 40 MG tablet Take 40 mg by mouth daily. 04/06/16  Yes [provider]  gabapentin (NEURONTIN) 100 MG capsule Take 200 mg by mouth 3 (three) times daily.   Yes  [provider]  glipiZIDE (GLUCOTROL XL) 2.5 MG 24 hr tablet Take 2.5 mg by mouth daily.    Yes [provider]  ibuprofen (ADVIL,MOTRIN) 800 MG tablet Take 800 mg by mouth every 8 (eight) hours as needed for mild pain.    Yes [provider]  ipratropium-albuterol (DUONEB) 0.5-2.5 (3) MG/3ML SOLN Take 3 mLs by nebulization 4 (four) times daily. 03/28/16  Yes Vaughan Basta, MD  LEVEMIR FLEXTOUCH 100 UNIT/ML Pen Inject 15 Units into the skin at bedtime. 07/23/16  Yes [provider]  metFORMIN (GLUCOPHAGE) 500 MG tablet Take 500 mg by mouth 2 (two) times daily.  04/06/16  Yes [provider]  NOVOLOG FLEXPEN 100 UNIT/ML FlexPen Inject 2-10 Units into the skin 3 (three) times daily as needed.  Sliding scale if blood sugar is above 200. 04/02/16  Yes [provider]  omeprazole (PRILOSEC) 20 MG capsule Take 20 mg by mouth daily.   Yes [provider]  oxyCODONE-acetaminophen (PERCOCET) 10-325 MG tablet Take 1 tablet by mouth every 8 (eight) hours as needed for pain.  04/21/16  Yes [provider]  tiZANidine (ZANAFLEX) 4 MG capsule Take 4 mg by mouth 3 (three) times daily as needed for muscle spasms.   Yes [provider]  traZODone (DESYREL) 50 MG tablet Take 50 mg by mouth at bedtime. 08/10/16  Yes [provider]  umeclidinium bromide (INCRUSE ELLIPTA) 62.5 MCG/INH AEPB Inhale 1 puff into the lungs daily.   Yes [provider]    REVIEW OF SYSTEMS:  Review of Systems  Constitutional: Positive for malaise/fatigue. Negative for chills, fever and weight loss.  HENT: Negative for ear pain, hearing loss and tinnitus.   Eyes: Negative for blurred vision, double vision, pain and redness.  Respiratory: Positive for cough, sputum production and shortness of breath. Negative for hemoptysis.   Cardiovascular: Negative for chest pain, palpitations, orthopnea and leg swelling.  Gastrointestinal: Negative for abdominal pain, constipation, diarrhea, nausea and vomiting.  Genitourinary: Negative for dysuria, frequency and hematuria.  Musculoskeletal: Negative for back pain, joint pain and neck pain.  Skin:       No acne, rash, or lesions  Neurological: Negative for dizziness, tremors, focal weakness and weakness.  Endo/Heme/Allergies: Negative for polydipsia. Does not bruise/bleed easily.  Psychiatric/Behavioral: Negative for depression. The patient is not nervous/anxious and does not have insomnia.      VITAL SIGNS:   Vitals:   08/14/16 2130 08/14/16 2131 08/14/16 2200 08/14/16 2201  BP: (!) 156/98  (!) 150/81   Pulse: (!) 120 (!) 120 (!) 117 (!) 118  Resp: (!) 25 (!) 24 18 (!) 22  Temp:      TempSrc:      SpO2: 94% 94% (!) 89% 93%   Weight:      Height:       Wt Readings from Last 3 Encounters:  08/14/16 101.2 kg (223 lb)  05/07/16 93.6 kg (206 lb 4.8 oz)  03/27/16 97.1 kg (214 lb 1.1 oz)    PHYSICAL EXAMINATION:  Physical Exam  Vitals reviewed. Constitutional: She is oriented to person, place, and time. She appears well-developed and well-nourished. No distress.  HENT:  Head: Normocephalic and atraumatic.  Mouth/Throat: Oropharynx is clear and moist.  Eyes: Conjunctivae and EOM are normal. Pupils are equal, round, and reactive to light. No scleral icterus.  Neck: Normal range of motion. Neck supple. No JVD present. No thyromegaly present.  Cardiovascular: Normal rate, regular rhythm and intact distal pulses.  Exam  reveals no gallop and no friction rub.   No murmur heard. Respiratory: Effort normal. She has no wheezes. She has no rales.  BL coarse breath sounds  GI: Soft. Bowel sounds are normal. She exhibits no distension. There is no tenderness.  Musculoskeletal: Normal range of motion. She exhibits no edema.  No arthritis, no gout  Lymphadenopathy:    She has no cervical adenopathy.  Neurological: She is alert and oriented to person, place, and time. No cranial nerve deficit.  No dysarthria, no aphasia  Skin: Skin is warm and dry. No rash noted. No erythema.  Psychiatric: She has a normal mood and affect. Her behavior is normal. Judgment and thought content normal.    LABORATORY PANEL:   CBC  Recent Labs Lab 08/14/16 2028  WBC 13.9*  HGB 10.1*  HCT 32.1*  PLT 306   ------------------------------------------------------------------------------------------------------------------  Chemistries   Recent Labs Lab 08/14/16 2028  NA 142  K 3.7  CL 104  CO2 27  GLUCOSE 219*  BUN 17  CREATININE 1.62*  CALCIUM 9.0   ------------------------------------------------------------------------------------------------------------------  Cardiac Enzymes  Recent Labs Lab 08/14/16 2028   TROPONINI <0.03   ------------------------------------------------------------------------------------------------------------------  RADIOLOGY:  Dg Chest Port 1 View  Result Date: 08/14/2016 CLINICAL DATA:  Patient with acute onset shortness of breath. EXAM: PORTABLE CHEST 1 VIEW COMPARISON:  Chest radiograph 05/06/2016 FINDINGS: Monitoring leads overlie the patient. Marked cardiomegaly. Right-greater-than-left basilar heterogeneous pulmonary opacities. No pleural effusion or pneumothorax. IMPRESSION: Heterogeneous opacities within the right-greater-than-left lung bases bilaterally favored to represent atelectasis. Infection not excluded. Electronically Signed   By: Lovey Newcomer M.D.   On: 08/14/2016 20:48    EKG:   Orders placed or performed during the hospital encounter of 08/14/16  . ED EKG  . ED EKG  . EKG 12-Lead  . EKG 12-Lead    IMPRESSION AND PLAN:  Principal Problem:   CAP (community acquired pneumonia) - IV antibiotics, when necessary duo nebs duo nebs, when necessary antitussive Active Problems:   COPD with acute exacerbation (HCC) - IV Solu-Medrol, other supportive care as above   Acute systolic CHF (congestive heart failure) (HCC) - IV Lasix given in the ED, we'll repeat another dose tonight. Continue other home meds   Accelerated hypertension - proceed significantly with nitroglycerin and Lasix, continue home meds as well as additional when necessary antihypertensives for blood pressure goal less than 160/100   Diabetes ([place]) - sliding scale insulin with corresponding glucose checks  All the records are reviewed and case discussed with ED provider. Management plans discussed with the patient and/or family.  DVT PROPHYLAXIS: SubQ lovenox  GI PROPHYLAXIS: None  ADMISSION STATUS: Inpatient  CODE STATUS: Full Code Status History    Date Active Date Inactive Code Status Order ID Comments User Context   05/05/2016  7:47 AM 05/07/2016  6:04 PM Full Code [id number]  Flora Lipps, MD ED   03/24/2016  2:16 PM 03/28/2016  2:24 PM Full Code [id number]  Awilda Bill, NP ED    Advance Directive Documentation     Most Recent Value  Type of Advance Directive  Living will  Pre-existing out of facility DNR order (yellow form or pink MOST form)  -  "MOST" Form in Place?  -      TOTAL TIME TAKING CARE OF THIS PATIENT: 45 minutes.   Jannifer Franklin, Karista Aispuro FIELDING 08/14/2016, 10:22 PM  [facility]  Office  [phone]  CC: Primary care physician; Denton Lank, MD  Note:  This document was  prepared using Systems analyst and may include unintentional dictation errors.

## 2016-08-14 NOTE — ED Notes (Addendum)
Pt given trial off bipap by RT. Placed on 4L. O2 sat 91% at best. RT increased to 5L.

## 2016-08-15 ENCOUNTER — Inpatient Hospital Stay
Admit: 2016-08-15 | Discharge: 2016-08-15 | Disposition: A | Discharge status: Home / Self Care | Payer: Medicare Other | Attending: Internal Medicine | Admitting: Internal Medicine

## 2016-08-15 DIAGNOSIS — I10 Essential (primary) hypertension: Secondary | ICD-10-CM

## 2016-08-15 DIAGNOSIS — J441 Chronic obstructive pulmonary disease with (acute) exacerbation: Secondary | ICD-10-CM

## 2016-08-15 DIAGNOSIS — I5021 Acute systolic (congestive) heart failure: Secondary | ICD-10-CM

## 2016-08-15 DIAGNOSIS — F17200 Nicotine dependence, unspecified, uncomplicated: Secondary | ICD-10-CM

## 2016-08-15 DIAGNOSIS — I739 Peripheral vascular disease, unspecified: Secondary | ICD-10-CM

## 2016-08-15 DIAGNOSIS — E042 Nontoxic multinodular goiter: Secondary | ICD-10-CM

## 2016-08-15 DIAGNOSIS — R0603 Acute respiratory distress: Secondary | ICD-10-CM

## 2016-08-15 LAB — BASIC METABOLIC PANEL
Anion gap: 11 (ref 5–15)
BUN: 22 mg/dL — ABNORMAL HIGH (ref 6–20)
CO2: 28 mmol/L (ref 22–32)
Calcium: 8.5 mg/dL — ABNORMAL LOW (ref 8.9–10.3)
Chloride: 98 mmol/L — ABNORMAL LOW (ref 101–111)
Creatinine, Ser: 1.83 mg/dL — ABNORMAL HIGH (ref 0.44–1.00)
GFR calc Af Amer: 30 mL/min — ABNORMAL LOW (ref >60)
GFR calc non Af Amer: 26 mL/min — ABNORMAL LOW (ref >60)
Glucose, Bld: 464 mg/dL — ABNORMAL HIGH (ref 65–99)
Potassium: 4 mmol/L (ref 3.5–5.1)
Sodium: 137 mmol/L (ref 135–145)

## 2016-08-15 LAB — BLOOD CULTURE ID PANEL (REFLEXED)

## 2016-08-15 LAB — ECHOCARDIOGRAM COMPLETE
AV Peak grad: 18 mmHg
AV pk vel: 212 cm/s
Ao pk vel: 0.45 m/s
Height: 66 in
LV dias vol index: 62 mL/m2
LV dias vol: 136 mL — AB (ref 46–106)
LV sys vol index: 37 mL/m2
LV sys vol: 81 mL — AB (ref 14–42)
LVOT peak vel: 96 cm/s
Simpson's disk: 41
Stroke v: 55 ml
Weight: 3476.64 oz

## 2016-08-15 LAB — CBC
HCT: 30 % — ABNORMAL LOW (ref 35.0–47.0)
Hemoglobin: 9.4 g/dL — ABNORMAL LOW (ref 12.0–16.0)
MCH: 26.7 pg (ref 26.0–34.0)
MCHC: 31.4 g/dL — ABNORMAL LOW (ref 32.0–36.0)
MCV: 85.3 fL (ref 80.0–100.0)
Platelets: 266 10*3/uL (ref 150–440)
RBC: 3.52 MIL/uL — ABNORMAL LOW (ref 3.80–5.20)
RDW: 18.2 % — ABNORMAL HIGH (ref 11.5–14.5)
WBC: 10 10*3/uL (ref 3.6–11.0)

## 2016-08-15 LAB — GLUCOSE, CAPILLARY
Glucose-Capillary: 444 mg/dL — ABNORMAL HIGH (ref 65–99)
Glucose-Capillary: 401 mg/dL — ABNORMAL HIGH (ref 65–99)
Glucose-Capillary: 361 mg/dL — ABNORMAL HIGH (ref 65–99)
Glucose-Capillary: 342 mg/dL — ABNORMAL HIGH (ref 65–99)

## 2016-08-15 MED ORDER — IPRATROPIUM-ALBUTEROL 0.5-2.5 (3) MG/3ML IN SOLN
3.0000 mL | Freq: Four times a day (QID) | RESPIRATORY_TRACT | Status: DC
  Administered 2016-08-15 – 2016-08-19 (×17): 3 mL via RESPIRATORY_TRACT
  Filled 2016-08-15 (×17): qty 3

## 2016-08-15 MED ORDER — CEFTRIAXONE SODIUM 1 G IJ SOLR
1.0000 g | INTRAMUSCULAR | Status: DC
  Administered 2016-08-16 – 2016-08-19 (×5): 1 g via INTRAVENOUS
  Filled 2016-08-15 (×4): qty 10

## 2016-08-15 MED ORDER — DEXTROSE 5 % IV SOLN
500.0000 mg | INTRAVENOUS | Status: DC
  Administered 2016-08-15: 500 mg via INTRAVENOUS
  Filled 2016-08-15 (×2): qty 500

## 2016-08-15 MED ORDER — ALBUTEROL SULFATE (2.5 MG/3ML) 0.083% IN NEBU: 2.5000 mg | INHALATION_SOLUTION | RESPIRATORY_TRACT | Status: DC | PRN

## 2016-08-15 MED ORDER — PERFLUTREN LIPID MICROSPHERE
1.0000 mL | INTRAVENOUS | Status: AC | PRN
  Administered 2016-08-15: 5 mL via INTRAVENOUS
  Filled 2016-08-15: qty 10

## 2016-08-15 MED ORDER — INSULIN ASPART 100 UNIT/ML ~~LOC~~ SOLN
0.0000 [IU] | Freq: Three times a day (TID) | SUBCUTANEOUS | Status: DC
  Administered 2016-08-15 – 2016-08-16 (×4): 15 [IU] via SUBCUTANEOUS
  Administered 2016-08-16: 5 [IU] via SUBCUTANEOUS
  Administered 2016-08-16: 15 [IU] via SUBCUTANEOUS
  Administered 2016-08-17: 11 [IU] via SUBCUTANEOUS
  Administered 2016-08-17: 15 [IU] via SUBCUTANEOUS
  Administered 2016-08-17: 11 [IU] via SUBCUTANEOUS
  Administered 2016-08-18: 15 [IU] via SUBCUTANEOUS
  Administered 2016-08-18: 5 [IU] via SUBCUTANEOUS
  Administered 2016-08-18: 15 [IU] via SUBCUTANEOUS
  Administered 2016-08-19 (×2): 8 [IU] via SUBCUTANEOUS
  Filled 2016-08-15: qty 8
  Filled 2016-08-15 (×2): qty 11
  Filled 2016-08-15 (×3): qty 15
  Filled 2016-08-15: qty 8
  Filled 2016-08-15: qty 3
  Filled 2016-08-15 (×2): qty 15
  Filled 2016-08-15: qty 5
  Filled 2016-08-15: qty 15
  Filled 2016-08-15: qty 5

## 2016-08-15 MED ORDER — GUAIFENESIN-DM 100-10 MG/5ML PO SYRP: 5.0000 mL | ORAL_SOLUTION | ORAL | Status: DC | PRN

## 2016-08-15 MED ORDER — TRAZODONE HCL 50 MG PO TABS
50.0000 mg | ORAL_TABLET | Freq: Every day | ORAL | Status: DC
Start: 2016-08-15 — End: 2016-08-19
  Administered 2016-08-15 – 2016-08-18 (×5): 50 mg via ORAL
  Filled 2016-08-15 (×5): qty 1

## 2016-08-15 MED ORDER — BENZONATATE 100 MG PO CAPS: 200.0000 mg | ORAL_CAPSULE | Freq: Three times a day (TID) | ORAL | Status: DC | PRN

## 2016-08-15 MED ORDER — METOPROLOL TARTRATE 25 MG PO TABS
12.5000 mg | ORAL_TABLET | Freq: Two times a day (BID) | ORAL | Status: DC
  Administered 2016-08-15 – 2016-08-16 (×3): 12.5 mg via ORAL
  Filled 2016-08-15 (×3): qty 1

## 2016-08-15 MED ORDER — AZITHROMYCIN 250 MG PO TABS
500.0000 mg | ORAL_TABLET | Freq: Every day | ORAL | Status: DC
  Administered 2016-08-15 – 2016-08-18 (×4): 500 mg via ORAL
  Filled 2016-08-15 (×5): qty 2

## 2016-08-15 MED ORDER — METHYLPREDNISOLONE SODIUM SUCC 125 MG IJ SOLR
60.0000 mg | Freq: Four times a day (QID) | INTRAMUSCULAR | Status: DC
  Administered 2016-08-15 – 2016-08-17 (×10): 60 mg via INTRAVENOUS
  Filled 2016-08-15 (×10): qty 2

## 2016-08-15 MED ORDER — CEFTRIAXONE SODIUM 1 G IJ SOLR
1.0000 g | Freq: Once | INTRAMUSCULAR | Status: AC
  Administered 2016-08-15: 1 g via INTRAVENOUS
  Filled 2016-08-15: qty 10

## 2016-08-15 NOTE — Consult Note (Signed)
[facility] Consultation Note  Patient ID: Mckenzie Taylor, MRN: [id number], DOB/AGE: February 19, 1941 76 y.o. Admit date: 08/14/2016   Date of Consult: 08/15/2016 Primary Physician: Denton Lank, MD Primary Cardiologist:None  Chief Complaint:  Chief Complaint  Patient presents with  . Shortness of Breath   Reason for Consult: congestive heart failure  HPI: 76 y.o. female with the known chronic kidney disease stage III anemia and diabetes with complication and apparent coronary atherosclerosis has come to the hospital with significant cough congestion and apparent pneumonia with hypoxia and chest x-ray consistent with mild the congestion consistent with LV systolic dysfunction heart failure. The patient has had an echocardiogram showing mild global LV systolic dysfunction with ejection fraction of 45% and a normal troponin without evidence of myocardial infarction. The patient does have a BNP of 317 and currently is improved with appropriate treatment of the pneumonia bronchitis and hypoxia. She slowly is getting better at this time and her EKG had shown sinus tachycardia most consistent with her illness but no evidence of myocardial infarction and or other EKG changes  Past Medical History:  Diagnosis Date  . Autosomal recessive polycystic kidneys   . Avascular necrosis of hip (HCC)    bilateral  . CAD (coronary artery disease)    mild  . CHF (congestive heart failure) ([place])   . Chronic kidney disease    cyst  . COPD (chronic obstructive pulmonary disease) ([place])   . Diabetes type 2, controlled ([place])   . GERD (gastroesophageal reflux disease)   . HTN (hypertension)   . Hypothyroidism    subclinical. low TSH, normal thyroid panel. biopsy 2010  . Vitamin B12 deficiency       Surgical History:  Past Surgical History:  Procedure Laterality Date  . CHOLECYSTECTOMY    . hip replacement     bilateral-secondary to avascular necrosis  . right eye lens replacement    . ULNAR  NERVE REPAIR     bilateral  . VESICOVAGINAL FISTULA CLOSURE W/ TAH       Home Meds: Prior to Admission medications   Medication Sig Start Date End Date Taking? Authorizing Provider  albuterol (PROVENTIL) (2.5 MG/3ML) 0.083% nebulizer solution Take by nebulization every 4 (four) hours as needed for wheezing or shortness of breath.   Yes [provider]  albuterol (VENTOLIN HFA) 108 (90 BASE) MCG/ACT inhaler Inhale 2 puffs into the lungs every 6 (six) hours as needed.     Yes [provider]  amLODipine (NORVASC) 10 MG tablet Take 10 mg by mouth daily.     Yes [provider]  atorvastatin (LIPITOR) 40 MG tablet Take 40 mg by mouth daily.   Yes [provider]  enalapril (VASOTEC) 20 MG tablet Take 20 mg by mouth 2 (two) times daily.     Yes [provider]  FLUoxetine (PROZAC) 40 MG capsule Take 40 mg by mouth.   Yes [provider]  furosemide (LASIX) 40 MG tablet Take 40 mg by mouth daily. 04/06/16  Yes [provider]  gabapentin (NEURONTIN) 100 MG capsule Take 200 mg by mouth 3 (three) times daily.   Yes [provider]  glipiZIDE (GLUCOTROL XL) 2.5 MG 24 hr tablet Take 2.5 mg by mouth daily.    Yes [provider]  ibuprofen (ADVIL,MOTRIN) 800 MG tablet Take 800 mg by mouth every 8 (eight) hours as needed for mild pain.    Yes [provider]  ipratropium-albuterol (DUONEB) 0.5-2.5 (3) MG/3ML SOLN Take  3 mLs by nebulization 4 (four) times daily. 03/28/16  Yes Vaughan Basta, MD  LEVEMIR FLEXTOUCH 100 UNIT/ML Pen Inject 15 Units into the skin at bedtime. 07/23/16  Yes [provider]  metFORMIN (GLUCOPHAGE) 500 MG tablet Take 500 mg by mouth 2 (two) times daily.  04/06/16  Yes [provider]  NOVOLOG FLEXPEN 100 UNIT/ML FlexPen Inject 2-10 Units into the skin 3 (three) times daily as needed. Sliding scale if blood sugar is above 200. 04/02/16  Yes [provider]   omeprazole (PRILOSEC) 20 MG capsule Take 20 mg by mouth daily.   Yes [provider]  oxyCODONE-acetaminophen (PERCOCET) 10-325 MG tablet Take 1 tablet by mouth every 8 (eight) hours as needed for pain.  04/21/16  Yes [provider]  tiZANidine (ZANAFLEX) 4 MG capsule Take 4 mg by mouth 3 (three) times daily as needed for muscle spasms.   Yes [provider]  traZODone (DESYREL) 50 MG tablet Take 50 mg by mouth at bedtime. 08/10/16  Yes [provider]  umeclidinium bromide (INCRUSE ELLIPTA) 62.5 MCG/INH AEPB Inhale 1 puff into the lungs daily.   Yes [provider]    Inpatient Medications:  . amLODipine  10 mg Oral Daily  . atorvastatin  40 mg Oral Daily  . azithromycin  500 mg Oral Daily  . enalapril  20 mg Oral BID  . FLUoxetine  40 mg Oral Daily  . furosemide  40 mg Oral Daily  . heparin  5,000 Units Subcutaneous Q8H  . insulin aspart  0-15 Units Subcutaneous TID WC  . insulin aspart  0-5 Units Subcutaneous QHS  . ipratropium-albuterol  3 mL Nebulization QID  . methylPREDNISolone (SOLU-MEDROL) injection  60 mg Intravenous Q6H  . pantoprazole  40 mg Oral Daily  . traZODone  50 mg Oral QHS  . umeclidinium bromide  1 puff Inhalation Daily   . [START ON 08/16/2016] cefTRIAXone (ROCEPHIN) IVPB 1 gram/50 mL D5W      Allergies: No Known Allergies  Social History   Social History  . Marital status: Married    Spouse name: N/A  . Number of children: N/A  . Years of education: N/A   Occupational History  . Not on file.   Social History Main Topics  . Smoking status: Former Smoker    Packs/day: 2.00  . Smokeless tobacco: Never Used     Comment: 1 ppd - 50 years   . Alcohol use No  . Drug use: No  . Sexual activity: Not on file   Other Topics Concern  . Not on file   Social History Narrative   Separated, has 2 adult children.    Retired Regulatory affairs officer, on disability.      Family History  Problem Relation Age of Onset  . Cancer  Father        lung  . COPD Mother   . Heart disease Mother   . Heart disease Maternal Uncle      Review of Systems Positive forShortness of breath cough congestion Negative for: General:  chills, fever, night sweats or weight changes.  Cardiovascular: PND orthopnea syncope dizziness  Dermatological skin lesions rashes Respiratory: Positive for Cough congestion Urologic: Frequent urination urination at night and hematuria Abdominal: negative for nausea, vomiting, diarrhea, bright red blood per rectum, melena, or hematemesis Neurologic: negative for visual changes, and/or hearing changes  All other systems reviewed and are otherwise negative except as noted above.  Labs:  Recent Labs  08/14/16 2028  TROPONINI <  0.03   Lab Results  Component Value Date   WBC 10.0 08/15/2016   HGB 9.4 (L) 08/15/2016   HCT 30.0 (L) 08/15/2016   MCV 85.3 08/15/2016   PLT 266 08/15/2016    Recent Labs Lab 08/15/16 0446  NA 137  K 4.0  CL 98*  CO2 28  BUN 22*  CREATININE 1.83*  CALCIUM 8.5*  GLUCOSE 464*   No results found for: CHOL, HDL, LDLCALC, TRIG No results found for: DDIMER  Radiology/Studies:  Dg Chest Port 1 View  Result Date: 08/14/2016 CLINICAL DATA:  Patient with acute onset shortness of breath. EXAM: PORTABLE CHEST 1 VIEW COMPARISON:  Chest radiograph 05/06/2016 FINDINGS: Monitoring leads overlie the patient. Marked cardiomegaly. Right-greater-than-left basilar heterogeneous pulmonary opacities. No pleural effusion or pneumothorax. IMPRESSION: Heterogeneous opacities within the right-greater-than-left lung bases bilaterally favored to represent atelectasis. Infection not excluded. Electronically Signed   By: Lovey Newcomer M.D.   On: 08/14/2016 20:48    KHT:XHFSF tachycardia  Weights: Filed Weights   08/14/16 2030 08/14/16 2335 08/15/16 0507  Weight: 101.2 kg (223 lb) 98.6 kg (217 lb 4.8 oz) 98.6 kg (217 lb 4.6 oz)     Physical Exam: Blood pressure 138/77, pulse (!)  109, temperature 97.7 F (36.5 C), temperature source Oral, resp. rate 18, height 5\' 6"  (1.676 m), weight 98.6 kg (217 lb 4.6 oz), SpO2 95 %. Body mass index is 35.07 kg/m. General: Well developed, well nourished, in no acute distress. Head eyes ears nose throat: Normocephalic, atraumatic, sclera non-icteric, no xanthomas, nares are without discharge. No apparent thyromegaly and/or mass  Lungs: Normal respiratory effort.Diffuse wheezes, no rales, some rhonchi.  Heart: RRR with normal S1 S2. no murmur gallop, no rub, PMI is normal size and placement, carotid upstroke normal without bruit, jugular venous pressure is normal Abdomen: Soft, non-tender, non-distended with normoactive bowel sounds. No hepatomegaly. No rebound/guarding. No obvious abdominal masses. Abdominal aorta is normal size without bruit Extremities: No edema. no cyanosis, no clubbing, no ulcers  Peripheral : 2+ bilateral upper extremity pulses, 2+ bilateral femoral pulses, 2+ bilateral dorsal pedal pulse Neuro: Alert and oriented. No facial asymmetry. No focal deficit. Moves all extremities spontaneously. Musculoskeletal: Normal muscle tone without kyphosis Psych:  Responds to questions appropriately with a normal affect.    Assessment: 76 year old female with background diabetes anemia chronic kidney disease and coronary atherosclerosis having acute mild systolic dysfunction congestive heart failure likely due to pneumonia and hypoxia without evidence of myocardial infarction  Plan: 1. Continue supportive care for her pneumonia hypoxia cough and congestion with antibiotics and inhalers 2. Beta blocker ACE inhibitor amlodipine as necessary for heart rate control LV systolic dysfunction and hypertension control 3. Furosemide for the pulmonary edema hypoxia watching closely for concerns of chronic kidney disease 4. No further cardiac diagnostics necessary at this time due to no evidence of myocardial infarction  Signed, Corey Skains M.D. [facility] 08/15/2016, 1:31 PM

## 2016-08-15 NOTE — Plan of Care (Signed)
Problem: Fluid Volume: Goal: Ability to maintain a balanced intake and output will improve Outcome: Progressing Fluid restriction

## 2016-08-15 NOTE — Progress Notes (Signed)
Patient observed struggling to breath and SOB  each time she uses the bedside commode.  Dr. Marcille Blanco called  to patient's bedside to see pt.  New order for breathing treatment and BIPAP as needed  Received. Will continue top monitor.

## 2016-08-15 NOTE — Progress Notes (Signed)
Pharmacy Antibiotic Note  Mckenzie Taylor is a 76 y.o. female admitted on 08/14/2016 with pneumonia.  Pharmacy has been consulted for ceftriaxone dosing.  Plan: Ceftriaxone 1 gram q 24 hours ordered.  Height: 5\' 6"  (167.6 cm) Weight: 217 lb 4.8 oz (98.6 kg) IBW/kg (Calculated) : 59.3  Temp (24hrs), Avg:98.5 F (36.9 C), Min:98.4 F (36.9 C), Max:98.6 F (37 C)   Recent Labs Lab 08/14/16 2028  WBC 13.9*  CREATININE 1.62*    Estimated Creatinine Clearance: 36.1 mL/min (A) (by C-G formula based on SCr of 1.62 mg/dL (H)).    No Known Allergies  Antimicrobials this admission: ceftriaxone azithromycin 6/2 >>    >>   Dose adjustments this admission:   Microbiology results: 6/2 BCx: pending 2/21 MRSA PCR: (-)      6/2 CXR: Heterogeneous opacities R>L Thank you for allowing pharmacy to be a part of this patient's care.  Jaydin Jalomo S 08/15/2016 12:40 AM

## 2016-08-15 NOTE — Progress Notes (Signed)
Patient is admitted to room 249 with the diagnosis of community acquired pneumonia. Alert and oriented x 4. Denied any acute pain at this moment. Tele box called to CCMD with Ninfa Meeker RN as a second verifier. Skin assessment done with Alisa as well, no skin issues to report. Fall risk reviewed and pt. Signed the contract. Per patient's request, home med Trazodone is initiated by Dr. Jannifer Franklin. Will continue  monitor.

## 2016-08-15 NOTE — Plan of Care (Signed)
Problem: Activity: Goal: Risk for activity intolerance will decrease Outcome: Progressing Up to [facility] and Chair with Stand by assist

## 2016-08-15 NOTE — Progress Notes (Signed)
*  PRELIMINARY RESULTS* Echocardiogram 2D Echocardiogram has been performed. Definity Contrast used on this study.  Mckenzie Taylor Mckenzie Taylor 08/15/2016, 9:38 AM

## 2016-08-15 NOTE — Progress Notes (Signed)
[facility] [place] NAME: Mckenzie Taylor    MR#:  [id number]  DATE OF BIRTH:  27-Jan-1941  SUBJECTIVE:  CHIEF COMPLAINT:  Patient's shortness of breath is better than yesterday REVIEW OF SYSTEMS:  CONSTITUTIONAL: No fever, fatigue or weakness.  EYES: No blurred or double vision.  EARS, NOSE, AND THROAT: No tinnitus or ear pain.  RESPIRATORY: reports cough, shortness of breath, no wheezing or hemoptysis.  CARDIOVASCULAR: No chest pain, orthopnea, edema.  GASTROINTESTINAL: No nausea, vomiting, diarrhea or abdominal pain.  GENITOURINARY: No dysuria, hematuria.  ENDOCRINE: No polyuria, nocturia,  HEMATOLOGY: No anemia, easy bruising or bleeding SKIN: No rash or lesion. MUSCULOSKELETAL: No joint pain or arthritis.   NEUROLOGIC: No tingling, numbness, weakness.  PSYCHIATRY: No anxiety or depression.   DRUG ALLERGIES:  No Known Allergies  VITALS:  Blood pressure 138/77, pulse (!) 109, temperature 97.7 F (36.5 C), temperature source Oral, resp. rate 18, height 5\' 6"  (1.676 m), weight 98.6 kg (217 lb 4.6 oz), SpO2 95 %.  PHYSICAL EXAMINATION:  GENERAL:  76 y.o.-year-old patient lying in the bed with no acute distress.  EYES: Pupils equal, round, reactive to light and accommodation. No scleral icterus. Extraocular muscles intact.  HEENT: Head atraumatic, normocephalic. Oropharynx and nasopharynx clear.  NECK:  Supple, no jugular venous distention. No thyroid enlargement, no tenderness.  LUNGS: Diminished breath sounds bilaterally, min wheezing, rales,rhonchi or crepitation. No use of accessory muscles of respiration.  CARDIOVASCULAR: S1, S2 normal. No murmurs, rubs, or gallops.  ABDOMEN: Soft, nontender, nondistended. Bowel sounds present. No organomegaly or mass.  EXTREMITIES: No pedal edema, cyanosis, or clubbing.  NEUROLOGIC: Cranial nerves II through XII are intact. Muscle strength 5/5 in all extremities. Sensation intact. Gait  not checked.  PSYCHIATRIC: The patient is alert and oriented x 3.  SKIN: No obvious rash, lesion, or ulcer.    LABORATORY PANEL:   CBC  Recent Labs Lab 08/15/16 0446  WBC 10.0  HGB 9.4*  HCT 30.0*  PLT 266   ------------------------------------------------------------------------------------------------------------------  Chemistries   Recent Labs Lab 08/15/16 0446  NA 137  K 4.0  CL 98*  CO2 28  GLUCOSE 464*  BUN 22*  CREATININE 1.83*  CALCIUM 8.5*   ------------------------------------------------------------------------------------------------------------------  Cardiac Enzymes  Recent Labs Lab 08/14/16 2028  TROPONINI <0.03   ------------------------------------------------------------------------------------------------------------------  RADIOLOGY:  Dg Chest Port 1 View  Result Date: 08/14/2016 CLINICAL DATA:  Patient with acute onset shortness of breath. EXAM: PORTABLE CHEST 1 VIEW COMPARISON:  Chest radiograph 05/06/2016 FINDINGS: Monitoring leads overlie the patient. Marked cardiomegaly. Right-greater-than-left basilar heterogeneous pulmonary opacities. No pleural effusion or pneumothorax. IMPRESSION: Heterogeneous opacities within the right-greater-than-left lung bases bilaterally favored to represent atelectasis. Infection not excluded. Electronically Signed   By: Lovey Newcomer M.D.   On: 08/14/2016 20:48    EKG:   Orders placed or performed during the hospital encounter of 08/14/16  . ED EKG  . ED EKG  . EKG 12-Lead  . EKG 12-Lead    ASSESSMENT AND PLAN:    CAP (community acquired pneumonia) - IV antibiotics Rocephin and azithromycin, when necessary duo nebs duo nebs, when necessary antitussive Supportive care    COPD with acute exacerbation ([place]) - IV Solu-Medrol, nebulizer rx , IV Rocephin and azithromycin    Acute systolic CHF (congestive heart failure) (HCC) - IV Lasix given . Continue Lasix by mouth  Daily weight monitoring  Intake  and output  Continue ACE inhibitor statin and Lasix, will add beta  blocker if blood pressure permits Follow-up with cardiology    Accelerated hypertension - blood pressure is better. Continue home medications Norvasc, enalapril and Lasix  continue home meds as well as additional when necessary antihypertensives for blood pressure goal less than 160/100    Diabetes ([place]) - sliding scale insulin with corresponding glucose checks    All the records are reviewed and case discussed with Care Management/Social Workerr. Management plans discussed with the patient, family and they are in agreement.  CODE STATUS: fc   TOTAL TIME TAKING CARE OF THIS PATIENT: 36` minutes.   POSSIBLE D/C IN 2 DAYS, DEPENDING ON CLINICAL CONDITION.  Note: This dictation was prepared with Dragon dictation along with smaller phrase technology. Any transcriptional errors that result from this process are unintentional.   [name] M.D on 08/15/2016 at 2:20 PM  Between 7am to 6pm - Pager - [phone] After 6pm go to [email or web address] - password [email or web address] [facility]  [facility]  Office  [phone]  CC: Primary care physician; Denton Lank, MD

## 2016-08-16 ENCOUNTER — Encounter: Payer: Self-pay | Admitting: *Deleted

## 2016-08-16 LAB — GLUCOSE, CAPILLARY
Glucose-Capillary: 379 mg/dL — ABNORMAL HIGH (ref 65–99)
Glucose-Capillary: 356 mg/dL — ABNORMAL HIGH (ref 65–99)
Glucose-Capillary: 227 mg/dL — ABNORMAL HIGH (ref 65–99)
Glucose-Capillary: 243 mg/dL — ABNORMAL HIGH (ref 65–99)

## 2016-08-16 MED ORDER — METOPROLOL TARTRATE 25 MG PO TABS
25.0000 mg | ORAL_TABLET | Freq: Two times a day (BID) | ORAL | Status: DC
  Administered 2016-08-16 – 2016-08-19 (×6): 25 mg via ORAL
  Filled 2016-08-16 (×6): qty 1

## 2016-08-16 MED ORDER — INSULIN DETEMIR 100 UNIT/ML ~~LOC~~ SOLN
15.0000 [IU] | Freq: Every day | SUBCUTANEOUS | Status: DC
  Administered 2016-08-16 – 2016-08-19 (×4): 15 [IU] via SUBCUTANEOUS
  Filled 2016-08-16 (×4): qty 0.15

## 2016-08-16 MED ORDER — INSULIN ASPART 100 UNIT/ML ~~LOC~~ SOLN
6.0000 [IU] | Freq: Three times a day (TID) | SUBCUTANEOUS | Status: DC
  Administered 2016-08-16 – 2016-08-19 (×9): 6 [IU] via SUBCUTANEOUS
  Filled 2016-08-16 (×9): qty 6

## 2016-08-16 MED ORDER — INSULIN DETEMIR 100 UNIT/ML ~~LOC~~ SOLN
15.0000 [IU] | Freq: Every day | SUBCUTANEOUS | Status: DC
  Filled 2016-08-16: qty 0.15

## 2016-08-16 NOTE — Progress Notes (Signed)
Inpatient Diabetes Program Recommendations  AACE/ADA: New Consensus Statement on Inpatient Glycemic Control (2015)  Target Ranges:  Prepandial:   less than 140 mg/dL      Peak postprandial:   less than 180 mg/dL (1-2 hours)      Critically ill patients:  140 - 180 mg/dL   Results for JAEDAN, SCHUMAN (MRN [id number]) as of 08/16/2016 13:19  Ref. Range 08/15/2016 07:54 08/15/2016 11:36 08/15/2016 16:32 08/15/2016 21:31  Glucose-Capillary Latest Ref Range: 65 - 99 mg/dL 444 (H) 401 (H) 361 (H) 342 (H)   Results for ALANII, RAMER (MRN [id number]) as of 08/16/2016 13:19  Ref. Range 08/16/2016 07:34 08/16/2016 11:53  Glucose-Capillary Latest Ref Range: 65 - 99 mg/dL 379 (H) 356 (H)    Admit with: Pneumonia  History: DM  Home DM Meds: Levemir 15 units QHS       Novolog 2-10 units TID       Metformin 500 mg BID       Glipizide 2.5 mg daily  Current Insulin Orders: Novolog Moderate Correction Scale/ SSI (0-15 units) TID AC + HS      MD- Note patient receiving Solumedrol 60 mg Q6 hours.  CBGs quite elevated.    Please consider the following:  1. Start Levemir 15 units QHS (home dose)  2. Start Novolog Meal Coverage: Novolog 6 units TID with meals (hold if pt eats <50% of meal)     --Will follow patient during hospitalization--  Wyn Quaker RN, MSN, CDE Diabetes Coordinator Inpatient Glycemic Control Team Team Pager: [phone] (8a-5p)

## 2016-08-16 NOTE — Care Management Note (Signed)
Case Management Note  Patient Details  Name: HENCHY MCCAULEY MRN: [id number] Date of Birth: April 27, 1940  Subjective/Objective:                 Admitted from home with pneumonia.  Chronic home oxygen through [facility].  Recent home health through [facility] but closed at present.  Has access to walkers, canes and wheelchair.  Current with PCP.  Denies issues accessing medical care, obtaining medications or with transportation.  Family will transport home. If needs home health, [facility] is agency preference.   Action/Plan:  Contacted [facility] and informed agency could accept home health referral if it is needed.    Expected Discharge Date:  08/17/16               Expected Discharge Plan:     In-House Referral:     Discharge planning Services     Post Acute Care Choice:    Choice offered to:     DME Arranged:    DME Agency:     HH Arranged:    HH Agency:     Status of Service:     If discussed at [employer], dates discussed:    Additional Comments:  Katrina Stack, RN 08/16/2016, 12:24 PM

## 2016-08-16 NOTE — Progress Notes (Signed)
[place] [facility] [place] NAME: Mckenzie Taylor    MR#:  [id number]  DATE OF BIRTH:  03-24-1940  SUBJECTIVE:  CHIEF COMPLAINT:  Patient's shortness of breath is better  REVIEW OF SYSTEMS:  CONSTITUTIONAL: No fever, fatigue or weakness.  EYES: No blurred or double vision.  EARS, NOSE, AND THROAT: No tinnitus or ear pain.  RESPIRATORY: reports cough, shortness of breath, no wheezing or hemoptysis.  CARDIOVASCULAR: No chest pain, orthopnea, edema.  GASTROINTESTINAL: No nausea, vomiting, diarrhea or abdominal pain.  GENITOURINARY: No dysuria, hematuria.  ENDOCRINE: No polyuria, nocturia,  HEMATOLOGY: No anemia, easy bruising or bleeding SKIN: No rash or lesion. MUSCULOSKELETAL: No joint pain or arthritis.   NEUROLOGIC: No tingling, numbness, weakness.  PSYCHIATRY: No anxiety or depression.   DRUG ALLERGIES:  No Known Allergies  VITALS:  Blood pressure 120/64, pulse 88, temperature 98.4 F (36.9 C), temperature source Oral, resp. rate 20, height 5\' 6"  (1.676 m), weight 93.1 kg (205 lb 4.8 oz), SpO2 95 %.  PHYSICAL EXAMINATION:  GENERAL:  76 y.o.-year-old patient lying in the bed with no acute distress.  EYES: Pupils equal, round, reactive to light and accommodation. No scleral icterus. Extraocular muscles intact.  HEENT: Head atraumatic, normocephalic. Oropharynx and nasopharynx clear.  NECK:  Supple, no jugular venous distention. No thyroid enlargement, no tenderness.  LUNGS: Diminished breath sounds bilaterally, min wheezing, rales,rhonchi or crepitation. No use of accessory muscles of respiration.  CARDIOVASCULAR: S1, S2 normal. No murmurs, rubs, or gallops.  ABDOMEN: Soft, nontender, nondistended. Bowel sounds present. No organomegaly or mass.  EXTREMITIES: No pedal edema, cyanosis, or clubbing.  NEUROLOGIC: Cranial nerves II through XII are intact. Muscle strength 5/5 in all extremities. Sensation intact. Gait not checked.   PSYCHIATRIC: The patient is alert and oriented x 3.  SKIN: No obvious rash, lesion, or ulcer.    LABORATORY PANEL:   CBC  Recent Labs Lab 08/15/16 0446  WBC 10.0  HGB 9.4*  HCT 30.0*  PLT 266   ------------------------------------------------------------------------------------------------------------------  Chemistries   Recent Labs Lab 08/15/16 0446  NA 137  K 4.0  CL 98*  CO2 28  GLUCOSE 464*  BUN 22*  CREATININE 1.83*  CALCIUM 8.5*   ------------------------------------------------------------------------------------------------------------------  Cardiac Enzymes  Recent Labs Lab 08/14/16 2028  TROPONINI <0.03   ------------------------------------------------------------------------------------------------------------------  RADIOLOGY:  Dg Chest Port 1 View  Result Date: 08/14/2016 CLINICAL DATA:  Patient with acute onset shortness of breath. EXAM: PORTABLE CHEST 1 VIEW COMPARISON:  Chest radiograph 05/06/2016 FINDINGS: Monitoring leads overlie the patient. Marked cardiomegaly. Right-greater-than-left basilar heterogeneous pulmonary opacities. No pleural effusion or pneumothorax. IMPRESSION: Heterogeneous opacities within the right-greater-than-left lung bases bilaterally favored to represent atelectasis. Infection not excluded. Electronically Signed   By: Lovey Newcomer M.D.   On: 08/14/2016 20:48    EKG:   Orders placed or performed during the hospital encounter of 08/14/16  . ED EKG  . ED EKG  . EKG 12-Lead  . EKG 12-Lead    ASSESSMENT AND PLAN:    CAP (community acquired pneumonia) - IV antibiotics Rocephin and azithromycin, when necessary duo nebs duo nebs, when necessary antitussive Supportive care    COPD with acute exacerbation ([place]) - IV Solu-Medrol, nebulizer rx , IV Rocephin and azithromycin  AKI  Patient was on Lasix for acute CHF exacerbation  will hold lasix Avoid other nephrotoxins and nephrology consult is placed Hold ACE  inhibitor    Acute systolic CHF (congestive heart failure) (HCC) - IV Lasix  given . Continue Lasix by mouth  Daily weight monitoring  Intake and output  Continue statin and hold ACE inhibitor and Lasix as renal function is getting worse, will add beta blocker if blood pressure permits Follow-up with cardiology   hypertension - blood pressure is better. Continue home medications Norvasc, metoprolol dose increased to 25 as we are holding enalapril and Lasix     Diabetes (HCC) - sliding scale insulin with corresponding glucose checks Levemir 15 units daily at bedtime and NovoLog 6 units 3 times a day    All the records are reviewed and case discussed with Care Management/Social Workerr. Management plans discussed with the patient, family and they are in agreement.  CODE STATUS: fc   TOTAL TIME TAKING CARE OF THIS PATIENT: 36` minutes.   POSSIBLE D/C IN 2 DAYS, DEPENDING ON CLINICAL CONDITION.  Note: This dictation was prepared with Dragon dictation along with smaller phrase technology. Any transcriptional errors that result from this process are unintentional.   [name] M.D on 08/16/2016 at 3:42 PM  Between 7am to 6pm - Pager - [phone] After 6pm go to [email or web address] - password [email or web address]  Office  [phone]  CC: Primary care physician; Denton Lank, MD

## 2016-08-16 NOTE — Discharge Instructions (Signed)
Heart Failure Clinic appointment on August 25, 2016 at 8:40am with Darylene Price, [place]. Please call [phone] to reschedule.

## 2016-08-16 NOTE — Progress Notes (Signed)
PHARMACIST - PHYSICIAN COMMUNICATION DR:   Margaretmary Eddy CONCERNING: Antibiotic IV to Oral Route Change Policy  RECOMMENDATION: This patient is receiving azithromycin by the intravenous route.  Based on criteria approved by the Pharmacy and Therapeutics Committee, the antibiotic(s) is/are being converted to the equivalent oral dose form(s).   DESCRIPTION: These criteria include:  Patient being treated for a respiratory tract infection, urinary tract infection, cellulitis or clostridium difficile associated diarrhea if on metronidazole  The patient is not neutropenic and does not exhibit a GI malabsorption state  The patient is eating (either orally or via tube) and/or has been taking other orally administered medications for a least 24 hours  The patient is improving clinically and has a Tmax < 100.5  If you have questions about this conversion, please contact the Pharmacy Department  []   [phone] )  [facility] [x]   [phone] )  [facility] []   [phone] )  [facility] []   [phone] )  [facility] []   [phone] )  [place], [place].D, BCPS Clinical Pharmacist

## 2016-08-16 NOTE — Consult Note (Signed)
[facility] CONSULT NOTE    Date: 08/16/2016                  Patient Name:  Mckenzie Taylor  MRN: [id number]  DOB: [birth year]  Age / Sex: 76 y.o., female         PCP: Denton Lank, MD                 Service Requesting Consult: Hospitalist                 Reason for Consult: Acute renal failure/CKD stage III            History of Present Illness: Patient is a 76 y.o. female with a PMHx of Polycystic kidney disease, avascular necrosis of the hips, coronary artery disease, chronic systolic heart failure ejection fraction 40-45%, chronic kidney disease stage III, GERD, hypertension, hyperthyroidism, vitamin B12 deficiency, who was admitted to [facility] on 08/14/2016 for evaluation of shortness of breath and acute systolic heart failure exacerbation. The patient was having significant shortness of breath prior to admission. This lasted over several days. She's been having associated cough as well. We are asked to see her for acute renal failure. She was on Lasix once she was admitted. Her baseline creatinine appears to be between 1.2-1.3. 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 Creatinine at the moment is up to 1.8. She still has some shortness of breath however this has improved since admission. She is also requiring nebulizer therapy.   Medications: Outpatient medications: Prescriptions Prior to Admission  Medication Sig Dispense Refill Last Dose  . albuterol (PROVENTIL) (2.5 MG/3ML) 0.083% nebulizer solution Take by nebulization every 4 (four) hours as needed for wheezing or shortness of breath.   prn at prn  . albuterol (VENTOLIN HFA) 108 (90 BASE) MCG/ACT inhaler Inhale 2 puffs into the lungs every 6 (six) hours as needed.     prn at prn  . amLODipine (NORVASC) 10 MG tablet Take 10 mg by mouth daily.     08/14/2016 at Unknown time  . atorvastatin (LIPITOR)  40 MG tablet Take 40 mg by mouth daily.   08/14/2016 at Unknown time  . enalapril (VASOTEC) 20 MG tablet Take 20 mg by mouth 2 (two) times daily.     08/14/2016 at Unknown time  . FLUoxetine (PROZAC) 40 MG capsule Take 40 mg by mouth.   08/14/2016 at Unknown time  . furosemide (LASIX) 40 MG tablet Take 40 mg by mouth daily.   08/14/2016 at Unknown time  . gabapentin (NEURONTIN) 100 MG capsule Take 200 mg by mouth 3 (three) times daily.   08/14/2016 at Unknown time  . glipiZIDE (GLUCOTROL XL) 2.5 MG 24 hr tablet Take 2.5 mg by mouth daily.    08/14/2016 at Unknown time  . ibuprofen (ADVIL,MOTRIN) 800 MG tablet Take 800 mg by mouth every 8 (eight) hours as needed for mild pain.    prn at prn  . ipratropium-albuterol (DUONEB) 0.5-2.5 (3) MG/3ML SOLN Take 3 mLs by nebulization 4 (four) times daily. 360 mL 0 08/14/2016 at Unknown time  . LEVEMIR FLEXTOUCH 100 UNIT/ML Pen Inject 15 Units into the skin at bedtime.   08/13/2016 at Unknown time  . metFORMIN (GLUCOPHAGE) 500 MG tablet Take 500 mg by mouth 2 (two) times daily.    08/14/2016 at Unknown time  . NOVOLOG FLEXPEN 100 UNIT/ML FlexPen Inject 2-10  Units into the skin 3 (three) times daily as needed. Sliding scale if blood sugar is above 200.   08/14/2016 at Unknown time  . omeprazole (PRILOSEC) 20 MG capsule Take 20 mg by mouth daily.   08/14/2016 at Unknown time  . oxyCODONE-acetaminophen (PERCOCET) 10-325 MG tablet Take 1 tablet by mouth every 8 (eight) hours as needed for pain.    prn at prn  . tiZANidine (ZANAFLEX) 4 MG capsule Take 4 mg by mouth 3 (three) times daily as needed for muscle spasms.   prn at prn  . traZODone (DESYREL) 50 MG tablet Take 50 mg by mouth at bedtime.   08/13/2016 at Unknown time  . umeclidinium bromide (INCRUSE ELLIPTA) 62.5 MCG/INH AEPB Inhale 1 puff into the lungs daily.   08/14/2016 at Unknown time    Current medications: Current Facility-Administered Medications  Medication Dose Route Frequency Provider Last Rate Last Dose  . acetaminophen  (TYLENOL) tablet 650 mg  650 mg Oral Q6H PRN Lance Coon, MD       Or  . acetaminophen (TYLENOL) suppository 650 mg  650 mg Rectal Q6H PRN Lance Coon, MD      . albuterol (PROVENTIL) (2.5 MG/3ML) 0.083% nebulizer solution 2.5 mg  2.5 mg Nebulization Q4H PRN Harrie Foreman, MD      . amLODipine (NORVASC) tablet 10 mg  10 mg Oral Daily Lance Coon, MD   10 mg at 08/16/16 0814  . atorvastatin (LIPITOR) tablet 40 mg  40 mg Oral Daily Lance Coon, MD   40 mg at 08/16/16 4818  . azithromycin (ZITHROMAX) tablet 500 mg  500 mg Oral Daily Lance Coon, MD   500 mg at 08/15/16 2119  . benzonatate (TESSALON) capsule 200 mg  200 mg Oral TID PRN Lance Coon, MD      . cefTRIAXone (ROCEPHIN) 1 g in dextrose 5 % 50 mL IVPB  1 g Intravenous Q24H Lance Coon, MD   Stopped at 08/16/16 1233  . FLUoxetine (PROZAC) capsule 40 mg  40 mg Oral Daily Lance Coon, MD   40 mg at 08/16/16 5631  . guaiFENesin-dextromethorphan (ROBITUSSIN DM) 100-10 MG/5ML syrup 5 mL  5 mL Oral Q4H PRN Lance Coon, MD      . heparin injection 5,000 Units  5,000 Units Subcutaneous Camelia Phenes Lance Coon, MD   5,000 Units at 08/16/16 0600  . insulin aspart (novoLOG) injection 0-15 Units  0-15 Units Subcutaneous TID WC Gouru, Aruna, MD   15 Units at 08/16/16 1158  . insulin aspart (novoLOG) injection 0-5 Units  0-5 Units Subcutaneous QHS Lance Coon, MD   3 Units at 08/15/16 2137  . insulin aspart (novoLOG) injection 6 Units  6 Units Subcutaneous TID WC Gouru, Aruna, MD      . insulin detemir (LEVEMIR) injection 15 Units  15 Units Subcutaneous Daily Gouru, Aruna, MD      . ipratropium-albuterol (DUONEB) 0.5-2.5 (3) MG/3ML nebulizer solution 3 mL  3 mL Nebulization QID Lance Coon, MD   3 mL at 08/16/16 1621  . labetalol (NORMODYNE,TRANDATE) injection 10 mg  10 mg Intravenous Q2H PRN Lance Coon, MD      . methylPREDNISolone sodium succinate (SOLU-MEDROL) 125 mg/2 mL injection 60 mg  60 mg Intravenous Q6H Lance Coon, MD    60 mg at 08/16/16 4970  . metoprolol tartrate (LOPRESSOR) tablet 25 mg  25 mg Oral BID Gouru, Aruna, MD      . ondansetron (ZOFRAN) tablet 4 mg  4 mg Oral Q6H PRN Lance Coon,  MD       Or  . ondansetron (ZOFRAN) injection 4 mg  4 mg Intravenous Q6H PRN Lance Coon, MD      . oxyCODONE-acetaminophen (PERCOCET/ROXICET) 5-325 MG per tablet 1 tablet  1 tablet Oral Q8H PRN Lance Coon, MD       And  . oxyCODONE (Oxy IR/ROXICODONE) immediate release tablet 5 mg  5 mg Oral Q8H PRN Lance Coon, MD   5 mg at 08/16/16 2703  . pantoprazole (PROTONIX) EC tablet 40 mg  40 mg Oral Daily Lance Coon, MD   40 mg at 08/16/16 0820  . traZODone (DESYREL) tablet 50 mg  50 mg Oral Corwin Levins, MD   50 mg at 08/15/16 2119  . umeclidinium bromide (INCRUSE ELLIPTA) 62.5 MCG/INH 1 puff  1 puff Inhalation Daily Lance Coon, MD   1 puff at 08/15/16 1000      Allergies: No Known Allergies    Past Medical History: Past Medical History:  Diagnosis Date  . Autosomal recessive polycystic kidneys   . Avascular necrosis of hip (HCC)    bilateral  . CAD (coronary artery disease)    mild  . CHF (congestive heart failure) ([place])   . Chronic kidney disease    cyst  . COPD (chronic obstructive pulmonary disease) ([place])   . Diabetes type 2, controlled ([place])   . GERD (gastroesophageal reflux disease)   . HTN (hypertension)   . Hypothyroidism    subclinical. low TSH, normal thyroid panel. biopsy 2010  . Vitamin B12 deficiency      Past Surgical History: Past Surgical History:  Procedure Laterality Date  . CHOLECYSTECTOMY    . hip replacement     bilateral-secondary to avascular necrosis  . right eye lens replacement    . ULNAR NERVE REPAIR     bilateral  . VESICOVAGINAL FISTULA CLOSURE W/ TAH       Family History: Family History  Problem Relation Age of Onset  . Cancer Father        lung  . COPD Mother   . Heart disease Mother   . Heart disease Maternal Uncle      Social  History: Social History   Social History  . Marital status: Married    Spouse name: N/A  . Number of children: N/A  . Years of education: N/A   Occupational History  . Not on file.   Social History Main Topics  . Smoking status: Former Smoker    Packs/day: 2.00  . Smokeless tobacco: Never Used     Comment: 1 ppd - 50 years   . Alcohol use No  . Drug use: No  . Sexual activity: Not on file   Other Topics Concern  . Not on file   Social History Narrative   Separated, has 2 adult children.    Retired Regulatory affairs officer, on disability.      Review of Systems: Review of Systems  Constitutional: Positive for malaise/fatigue. Negative for chills, fever and weight loss.  HENT: Negative for congestion, hearing loss and nosebleeds.   Eyes: Negative for blurred vision, double vision and photophobia.  Respiratory: Positive for cough, sputum production and shortness of breath.   Cardiovascular: Positive for orthopnea, leg swelling and PND. Negative for palpitations.  Gastrointestinal: Negative for heartburn, nausea and vomiting.  Genitourinary: Negative for dysuria, frequency and urgency.  Musculoskeletal: Negative for joint pain and myalgias.  Skin: Negative for itching and rash.  Neurological: Negative for dizziness and focal weakness.  Endo/Heme/Allergies:  Negative for polydipsia. Does not bruise/bleed easily.  Psychiatric/Behavioral: Negative for memory loss. The patient is not nervous/anxious.      Vital Signs: Blood pressure 120/64, pulse 88, temperature 98.4 F (36.9 C), temperature source Oral, resp. rate 20, height 5\' 6"  (1.676 m), weight 93.1 kg (205 lb 4.8 oz), SpO2 95 %.  Weight trends: Filed Weights   08/14/16 2335 08/15/16 0507 08/16/16 0409  Weight: 98.6 kg (217 lb 4.8 oz) 98.6 kg (217 lb 4.6 oz) 93.1 kg (205 lb 4.8 oz)    Physical Exam: General: NAD, resting in bed  Head: Normocephalic, atraumatic.  Eyes: Anicteric, EOMI  Nose: Mucous membranes moist, not  inflammed, nonerythematous.  Throat: Oropharynx nonerythematous, no exudate appreciated.   Neck: Supple, trachea midline.  Lungs:  Normal respiratory effort. Bilateral wheezing and rhonchi.   Heart: RRR. S1 and S2 normal without gallop, murmur, or rubs.  Abdomen:  BS normoactive. Soft, Nondistended, non-tender.  No masses or organomegaly.  Extremities: 1+ bilateral lower extremity edema   Neurologic: A&O X3, Motor strength is 5/5 in the all 4 extremities  Skin: No visible rashes, scars.    Lab results: Basic Metabolic Panel:  Recent Labs Lab 08/14/16 2028 08/15/16 0446  NA 142 137  K 3.7 4.0  CL 104 98*  CO2 27 28  GLUCOSE 219* 464*  BUN 17 22*  CREATININE 1.62* 1.83*  CALCIUM 9.0 8.5*    Liver Function Tests: No results for input(s): AST, ALT, ALKPHOS, BILITOT, PROT, ALBUMIN in the last 168 hours. No results for input(s): LIPASE, AMYLASE in the last 168 hours. No results for input(s): AMMONIA in the last 168 hours.  CBC:  Recent Labs Lab 08/14/16 2028 08/15/16 0446  WBC 13.9* 10.0  NEUTROABS 10.5*  --   HGB 10.1* 9.4*  HCT 32.1* 30.0*  MCV 86.5 85.3  PLT 306 266    Cardiac Enzymes:  Recent Labs Lab 08/14/16 2028  TROPONINI <0.03    BNP: Invalid input(s): POCBNP  CBG:  Recent Labs Lab 08/15/16 1136 08/15/16 1632 08/15/16 2131 08/16/16 0734 08/16/16 1153  GLUCAP 401* 361* 342* 379* 356*    Microbiology: Results for orders placed or performed during the hospital encounter of 08/14/16  Culture, blood (routine x 2)     Status: None (Preliminary result)   Collection Time: 08/14/16  8:28 PM  Result Value Ref Range Status   Specimen Description BLOOD LEFT FOREARM  Final   Special Requests   Final    BOTTLES DRAWN AEROBIC AND ANAEROBIC Blood Culture adequate volume   Culture  Setup Time   Final    Organism ID to follow GRAM POSITIVE COCCI IN BOTH AEROBIC AND ANAEROBIC BOTTLES CRITICAL RESULT CALLED TO, READ BACK BY AND VERIFIED WITH: JASON  ROBBINS 08/15/16 AT 1925 BY HS    Culture GRAM POSITIVE COCCI  Final   Report Status PENDING  Incomplete  Culture, blood (routine x 2)     Status: None (Preliminary result)   Collection Time: 08/14/16  8:28 PM  Result Value Ref Range Status   Specimen Description BLOOD LEFT ANTECUBITAL  Final   Special Requests   Final    BOTTLES DRAWN AEROBIC AND ANAEROBIC Blood Culture adequate volume   Culture NO GROWTH 2 DAYS  Final   Report Status PENDING  Incomplete  Blood Culture ID Panel (Reflexed)     Status: Abnormal   Collection Time: 08/14/16  8:28 PM  Result Value Ref Range Status   Enterococcus species NOT DETECTED NOT DETECTED Final  Listeria monocytogenes NOT DETECTED NOT DETECTED Final   Staphylococcus species DETECTED (A) NOT DETECTED Final    Comment: Methicillin (oxacillin) susceptible coagulase negative staphylococcus. Possible blood culture contaminant (unless isolated from more than one blood culture draw or clinical case suggests pathogenicity). No antibiotic treatment is indicated for blood  culture contaminants. CRITICAL RESULT CALLED TO, READ BACK BY AND VERIFIED WITH: JASON ROBBINS 08/15/16 AT 1925 BY HS    Staphylococcus aureus NOT DETECTED NOT DETECTED Final   Methicillin resistance NOT DETECTED NOT DETECTED Final   Streptococcus species NOT DETECTED NOT DETECTED Final   Streptococcus agalactiae NOT DETECTED NOT DETECTED Final   Streptococcus pneumoniae NOT DETECTED NOT DETECTED Final   Streptococcus pyogenes NOT DETECTED NOT DETECTED Final   Acinetobacter baumannii NOT DETECTED NOT DETECTED Final   Enterobacteriaceae species NOT DETECTED NOT DETECTED Final   Enterobacter cloacae complex NOT DETECTED NOT DETECTED Final   Escherichia coli NOT DETECTED NOT DETECTED Final   Klebsiella oxytoca NOT DETECTED NOT DETECTED Final   Klebsiella pneumoniae NOT DETECTED NOT DETECTED Final   Proteus species NOT DETECTED NOT DETECTED Final   Serratia marcescens NOT DETECTED NOT  DETECTED Final   Haemophilus influenzae NOT DETECTED NOT DETECTED Final   Neisseria meningitidis NOT DETECTED NOT DETECTED Final   Pseudomonas aeruginosa NOT DETECTED NOT DETECTED Final   Candida albicans NOT DETECTED NOT DETECTED Final   Candida glabrata NOT DETECTED NOT DETECTED Final   Candida krusei NOT DETECTED NOT DETECTED Final   Candida parapsilosis NOT DETECTED NOT DETECTED Final   Candida tropicalis NOT DETECTED NOT DETECTED Final    Coagulation Studies: No results for input(s): LABPROT, INR in the last 72 hours.  Urinalysis: No results for input(s): COLORURINE, LABSPEC, PHURINE, GLUCOSEU, HGBUR, BILIRUBINUR, KETONESUR, PROTEINUR, UROBILINOGEN, NITRITE, LEUKOCYTESUR in the last 72 hours.  Invalid input(s): APPERANCEUR    Imaging: Dg Chest Port 1 View  Result Date: 08/14/2016 CLINICAL DATA:  Patient with acute onset shortness of breath. EXAM: PORTABLE CHEST 1 VIEW COMPARISON:  Chest radiograph 05/06/2016 FINDINGS: Monitoring leads overlie the patient. Marked cardiomegaly. Right-greater-than-left basilar heterogeneous pulmonary opacities. No pleural effusion or pneumothorax. IMPRESSION: Heterogeneous opacities within the right-greater-than-left lung bases bilaterally favored to represent atelectasis. Infection not excluded. Electronically Signed   By: Lovey Newcomer M.D.   On: 08/14/2016 20:48      Assessment & Plan: Pt is a 76 y.o. female with a PMHx of Polycystic kidney disease, avascular necrosis of the hips, coronary artery disease, chronic systolic heart failure ejection fraction 40-45%, chronic kidney disease stage III, GERD, hypertension, hyperthyroidism, vitamin B12 deficiency, who was admitted to [facility] on 08/14/2016 for evaluation of shortness of breath and acute systolic heart failure exacerbation.  1.  Acute renal failure. 2.  CKD stage III due to polycystic kidney disease 3.  Anemia of CKD. 4.  Hypertension. 5.  Acute on chronic systolic heart failure.    Plan:  We  are asked to see the patient for evaluation management of acute renal failure in the setting of known chronic kidney disease stage III secondary to polycystic kidney disease. We'll proceed with renal ultrasound to make sure there is no underlying hydronephrosis and to reevaluate her polycystic kidney disease. Suspect that her acute renal failure is related to recent Lasix usage. Agree with holding Lasix for now. Continue to monitor renal function daily. Hemoglobin currently 9.4. Check SPEP and UPEP. No urgent indication for Epogen but we may need to consider this as an outpatient. Further plan as patient progresses. Thanks  for consultation.

## 2016-08-16 NOTE — Progress Notes (Signed)
3 L of oxygen. Sinus tachy. FS are stable. Pt up to side of bed and tolerated it well. Takes meds ok. Up to [facility] and tolerated it well Pt has not reported any pain. Pt has no further concerns at this time.

## 2016-08-16 NOTE — Progress Notes (Signed)
[facility] Cardiology [facility] Encounter Note  Patient: Mckenzie Taylor / Admit Date: 08/14/2016 / Date of Encounter: 08/16/2016, 8:11 AM   Subjective: Patient weak fatigue tired and short of breath with still some cough and congestion due to pneumonia. No evidence of chest discomfort or significant tachycardia  Review of Systems: Positive for: Shortness of breath cough congestion Negative for: Vision change, hearing change, syncope, dizziness, nausea, vomiting,diarrhea, bloody stool, stomach pain, positive for cough, congestion, negative for diaphoresis, urinary frequency, urinary pain,skin lesions, skin rashes Others previously listed  Objective: Telemetry: Sinus tachycardia Physical Exam: Blood pressure (!) 141/77, pulse 89, temperature 97.6 F (36.4 C), temperature source Oral, resp. rate 16, height 5\' 6"  (1.676 m), weight 93.1 kg (205 lb 4.8 oz), SpO2 97 %. Body mass index is 33.14 kg/m. General: Well developed, well nourished, in no acute distress. Head: Normocephalic, atraumatic, sclera non-icteric, no xanthomas, nares are without discharge. Neck: No apparent masses Lungs: Normal respirations with diffuse wheezes, some rhonchi, no rales , no crackles   Heart: Regular rate and rhythm, normal S1 S2, no murmur, no rub, no gallop, PMI is normal size and placement, carotid upstroke normal without bruit, jugular venous pressure normal Abdomen: Soft, non-tender,  distended with normoactive bowel sounds. No hepatosplenomegaly. Abdominal aorta is normal size without bruit Extremities: Trace edema, no clubbing, no cyanosis, no ulcers,  Peripheral: 2+ radial, 2+ femoral, 2+ dorsal pedal pulses Neuro: Alert and oriented. Moves all extremities spontaneously. Psych:  Responds to questions appropriately with a normal affect.   Intake/Output Summary (Last 24 hours) at 08/16/16 0811 Last data filed at 08/16/16 0300  Gross per 24 hour  Intake             1020 ml  Output             1450 ml   Net             -430 ml    Inpatient Medications:  . amLODipine  10 mg Oral Daily  . atorvastatin  40 mg Oral Daily  . azithromycin  500 mg Oral Daily  . enalapril  20 mg Oral BID  . FLUoxetine  40 mg Oral Daily  . furosemide  40 mg Oral Daily  . heparin  5,000 Units Subcutaneous Q8H  . insulin aspart  0-15 Units Subcutaneous TID WC  . insulin aspart  0-5 Units Subcutaneous QHS  . ipratropium-albuterol  3 mL Nebulization QID  . methylPREDNISolone (SOLU-MEDROL) injection  60 mg Intravenous Q6H  . metoprolol tartrate  12.5 mg Oral BID  . pantoprazole  40 mg Oral Daily  . traZODone  50 mg Oral QHS  . umeclidinium bromide  1 puff Inhalation Daily   Infusions:  . cefTRIAXone (ROCEPHIN) IVPB 1 gram/50 mL D5W      Labs:  Recent Labs  08/14/16 2028 08/15/16 0446  NA 142 137  K 3.7 4.0  CL 104 98*  CO2 27 28  GLUCOSE 219* 464*  BUN 17 22*  CREATININE 1.62* 1.83*  CALCIUM 9.0 8.5*   No results for input(s): AST, ALT, ALKPHOS, BILITOT, PROT, ALBUMIN in the last 72 hours.  Recent Labs  08/14/16 2028 08/15/16 0446  WBC 13.9* 10.0  NEUTROABS 10.5*  --   HGB 10.1* 9.4*  HCT 32.1* 30.0*  MCV 86.5 85.3  PLT 306 266    Recent Labs  08/14/16 2028  TROPONINI <0.03   Invalid input(s): POCBNP No results for input(s): HGBA1C in the last 72 hours.   Weights:  Filed Weights   08/14/16 2335 08/15/16 0507 08/16/16 0409  Weight: 98.6 kg (217 lb 4.8 oz) 98.6 kg (217 lb 4.6 oz) 93.1 kg (205 lb 4.8 oz)     Radiology/Studies:  Dg Chest Port 1 View  Result Date: 08/14/2016 CLINICAL DATA:  Patient with acute onset shortness of breath. EXAM: PORTABLE CHEST 1 VIEW COMPARISON:  Chest radiograph 05/06/2016 FINDINGS: Monitoring leads overlie the patient. Marked cardiomegaly. Right-greater-than-left basilar heterogeneous pulmonary opacities. No pleural effusion or pneumothorax. IMPRESSION: Heterogeneous opacities within the right-greater-than-left lung bases bilaterally favored to  represent atelectasis. Infection not excluded. Electronically Signed   By: Lovey Newcomer M.D.   On: 08/14/2016 20:48     Assessment and Recommendation  76 y.o. female with the known chronic kidney disease anemia diabetes with complication apparent previous history of coronary artery disease having acute diastolic and systolic dysfunction congestive heart failure secondary to hypoxia, and pneumonia without evidence of myocardial infarction 1. Continue supportive care and treatment of pneumonia and hypoxia as the primary condition issue 2. No further intervention from the cardiac standpoint without evidence of myocardial infarction 3. Diuresis for any pulmonary edema which may occur watching closely for chronic kidney disease issues 4. Again ambulation and recovery from above and further treatment options after above  Signed, Serafina Royals M.D. FACC

## 2016-08-17 ENCOUNTER — Inpatient Hospital Stay: Payer: Medicare Other

## 2016-08-17 LAB — CBC
HCT: 31.2 % — ABNORMAL LOW (ref 35.0–47.0)
Hemoglobin: 10 g/dL — ABNORMAL LOW (ref 12.0–16.0)
MCH: 26.7 pg (ref 26.0–34.0)
MCHC: 32 g/dL (ref 32.0–36.0)
MCV: 83.4 fL (ref 80.0–100.0)
Platelets: 296 10*3/uL (ref 150–440)
RBC: 3.74 MIL/uL — ABNORMAL LOW (ref 3.80–5.20)
RDW: 17.4 % — ABNORMAL HIGH (ref 11.5–14.5)
WBC: 11.1 10*3/uL — ABNORMAL HIGH (ref 3.6–11.0)

## 2016-08-17 LAB — BASIC METABOLIC PANEL
Anion gap: 10 (ref 5–15)
BUN: 42 mg/dL — ABNORMAL HIGH (ref 6–20)
CO2: 30 mmol/L (ref 22–32)
Calcium: 8.9 mg/dL (ref 8.9–10.3)
Chloride: 100 mmol/L — ABNORMAL LOW (ref 101–111)
Creatinine, Ser: 1.56 mg/dL — ABNORMAL HIGH (ref 0.44–1.00)
GFR calc Af Amer: 37 mL/min — ABNORMAL LOW (ref >60)
GFR calc non Af Amer: 32 mL/min — ABNORMAL LOW (ref >60)
Glucose, Bld: 305 mg/dL — ABNORMAL HIGH (ref 65–99)
Potassium: 4.5 mmol/L (ref 3.5–5.1)
Sodium: 140 mmol/L (ref 135–145)

## 2016-08-17 LAB — GLUCOSE, CAPILLARY
Glucose-Capillary: 323 mg/dL — ABNORMAL HIGH (ref 65–99)
Glucose-Capillary: 308 mg/dL — ABNORMAL HIGH (ref 65–99)
Glucose-Capillary: 351 mg/dL — ABNORMAL HIGH (ref 65–99)
Glucose-Capillary: 179 mg/dL — ABNORMAL HIGH (ref 65–99)

## 2016-08-17 MED ORDER — METHYLPREDNISOLONE SODIUM SUCC 40 MG IJ SOLR
40.0000 mg | Freq: Two times a day (BID) | INTRAMUSCULAR | Status: DC
  Administered 2016-08-18 – 2016-08-19 (×3): 40 mg via INTRAVENOUS
  Filled 2016-08-17 (×3): qty 1

## 2016-08-17 NOTE — Consult Note (Signed)
Mckenzie Taylor is an 76 y.o. female.   Chief Complaint: Chief complaint for admission was shortness of breath. Chief complaint for purposes of surgical consultation is a bulge in the abdomen HPI: She has been admitted emergently to the hospital due to increasing shortness of breath also had recent significant swelling in her ankles. She had x-ray which did not clearly demonstrate pneumonia but does have some symptoms consistent with pneumonia. She also has had evidence of congestive heart failure. She has been treated with antibiotics and bronchodilators and diuretics. She has improved while in the hospital.  She also reports that she has had some bulging in the upper abdomen for a number of years and has had some moderate chronic pain associated with it and wanted to have this examined before she leaves the hospital. She reports recently has been having nausea but also has had nausea since her teenage years and chronically takes Phenergan at home. She has had no recent vomiting. She does report some bulging in the upper abdomen which as been chronic. She did have an umbilical hernia repair in 2015. Also in 2016 she was seen in my office and had a bulge approximated 7 cm cephalad to the umbilicus which was consistent with a small ventral hernia in addition to having a wide area of diastases recti. She reports some chronic mild intermittent pain at that site.  Past Medical History:  Diagnosis Date  . Autosomal recessive polycystic kidneys   . Avascular necrosis of hip (HCC)    bilateral  . CAD (coronary artery disease)    mild  . CHF (congestive heart failure) ([place])   . Chronic kidney disease    cyst  . COPD (chronic obstructive pulmonary disease) ([place])   . Diabetes type 2, controlled ([place])   . GERD (gastroesophageal reflux disease)   . HTN (hypertension)   . Hypothyroidism    subclinical. low TSH, normal thyroid panel. biopsy 2010  . Vitamin B12 deficiency   Her past medical history was  reviewed as noted above after clear history of polycystic kidney disease coronary artery disease congestive heart failure chronic obstructive pulmonary disease diabetes gastroesophageal reflux hypertension hypothyroidism.  Past Surgical History:  Procedure Laterality Date  . CHOLECYSTECTOMY    . hip replacement     bilateral-secondary to avascular necrosis  . right eye lens replacement    . ULNAR NERVE REPAIR     bilateral  . VESICOVAGINAL FISTULA CLOSURE W/ TAH     Did have umbilical hernia repair in 2015. She has in the remote past had open cholecystectomy. She also has had a hysterectomy in the remote past. Family History  Problem Relation Age of Onset  . Cancer Father        lung  . COPD Mother   . Heart disease Mother   . Heart disease Maternal Uncle    Social History:  reports that she has quit smoking. She smoked 2.00 packs per day. She has never used smokeless tobacco. She reports that she does not drink alcohol or use drugs.  Review of systems:  She reports she occasionally does feel some swollen glands on the posterior aspect of her neck. She reports some diminution and visual acuity. She reports no recent other acute illness such as cold or sore throat. She does have frequent dyspnea on exertion and some dyspnea at rest also has some frequent productive coughing and some wheezing. She reports recently had some significant swelling of her ankles prior to admission.. She also  felt very tired and fatigued prior to admission. She reports no chest pains. She reports she does have chronic nausea which dates back to her teenage years and chronically takes Phenergan. She said no recent vomiting. She reports she is tolerating a heart healthy diet. The hospital. She reports she has recently been voiding satisfactorily and also moving her bowels satisfactorily. She reports no rectal bleeding. Review of systems otherwise negative.  Allergies: No Known Allergies  Medications Prior to  Admission  Medication Sig Dispense Refill  . albuterol (PROVENTIL) (2.5 MG/3ML) 0.083% nebulizer solution Take by nebulization every 4 (four) hours as needed for wheezing or shortness of breath.    Marland Kitchen albuterol (VENTOLIN HFA) 108 (90 BASE) MCG/ACT inhaler Inhale 2 puffs into the lungs every 6 (six) hours as needed.      Marland Kitchen amLODipine (NORVASC) 10 MG tablet Take 10 mg by mouth daily.      Marland Kitchen atorvastatin (LIPITOR) 40 MG tablet Take 40 mg by mouth daily.    . enalapril (VASOTEC) 20 MG tablet Take 20 mg by mouth 2 (two) times daily.      Marland Kitchen FLUoxetine (PROZAC) 40 MG capsule Take 40 mg by mouth.    . furosemide (LASIX) 40 MG tablet Take 40 mg by mouth daily.    Marland Kitchen gabapentin (NEURONTIN) 100 MG capsule Take 200 mg by mouth 3 (three) times daily.    Marland Kitchen glipiZIDE (GLUCOTROL XL) 2.5 MG 24 hr tablet Take 2.5 mg by mouth daily.     Marland Kitchen ibuprofen (ADVIL,MOTRIN) 800 MG tablet Take 800 mg by mouth every 8 (eight) hours as needed for mild pain.     Marland Kitchen ipratropium-albuterol (DUONEB) 0.5-2.5 (3) MG/3ML SOLN Take 3 mLs by nebulization 4 (four) times daily. 360 mL 0  . LEVEMIR FLEXTOUCH 100 UNIT/ML Pen Inject 15 Units into the skin at bedtime.    . metFORMIN (GLUCOPHAGE) 500 MG tablet Take 500 mg by mouth 2 (two) times daily.     Marland Kitchen NOVOLOG FLEXPEN 100 UNIT/ML FlexPen Inject 2-10 Units into the skin 3 (three) times daily as needed. Sliding scale if blood sugar is above 200.    Marland Kitchen omeprazole (PRILOSEC) 20 MG capsule Take 20 mg by mouth daily.    Marland Kitchen oxyCODONE-acetaminophen (PERCOCET) 10-325 MG tablet Take 1 tablet by mouth every 8 (eight) hours as needed for pain.     Marland Kitchen tiZANidine (ZANAFLEX) 4 MG capsule Take 4 mg by mouth 3 (three) times daily as needed for muscle spasms.    . traZODone (DESYREL) 50 MG tablet Take 50 mg by mouth at bedtime.    Marland Kitchen umeclidinium bromide (INCRUSE ELLIPTA) 62.5 MCG/INH AEPB Inhale 1 puff into the lungs daily.      Results for orders placed or performed during the hospital encounter of 08/14/16  (from the past 48 hour(s))  Glucose, capillary     Status: Abnormal   Collection Time: 08/15/16  9:31 PM  Result Value Ref Range   Glucose-Capillary 342 (H) 65 - 99 mg/dL  Glucose, capillary     Status: Abnormal   Collection Time: 08/16/16  7:34 AM  Result Value Ref Range   Glucose-Capillary 379 (H) 65 - 99 mg/dL  Glucose, capillary     Status: Abnormal   Collection Time: 08/16/16 11:53 AM  Result Value Ref Range   Glucose-Capillary 356 (H) 65 - 99 mg/dL  Glucose, capillary     Status: Abnormal   Collection Time: 08/16/16  4:53 PM  Result Value Ref Range   Glucose-Capillary 227 (  H) 65 - 99 mg/dL  Glucose, capillary     Status: Abnormal   Collection Time: 08/16/16  9:00 PM  Result Value Ref Range   Glucose-Capillary 243 (H) 65 - 99 mg/dL  Glucose, capillary     Status: Abnormal   Collection Time: 08/17/16  7:34 AM  Result Value Ref Range   Glucose-Capillary 323 (H) 65 - 99 mg/dL  Glucose, capillary     Status: Abnormal   Collection Time: 08/17/16 12:18 PM  Result Value Ref Range   Glucose-Capillary 308 (H) 65 - 99 mg/dL  Basic metabolic panel     Status: Abnormal   Collection Time: 08/17/16 12:36 PM  Result Value Ref Range   Sodium 140 135 - 145 mmol/L   Potassium 4.5 3.5 - 5.1 mmol/L   Chloride 100 (L) 101 - 111 mmol/L   CO2 30 22 - 32 mmol/L   Glucose, Bld 305 (H) 65 - 99 mg/dL   BUN 42 (H) 6 - 20 mg/dL   Creatinine, Ser 1.56 (H) 0.44 - 1.00 mg/dL   Calcium 8.9 8.9 - 10.3 mg/dL   GFR calc non Af Amer 32 (L) >60 mL/min   GFR calc Af Amer 37 (L) >60 mL/min    Comment: (NOTE) The eGFR has been calculated using the CKD EPI equation. This calculation has not been validated in all clinical situations. eGFR's persistently <60 mL/min signify possible Chronic Kidney Disease.    Anion gap 10 5 - 15  CBC     Status: Abnormal   Collection Time: 08/17/16  1:01 PM  Result Value Ref Range   WBC 11.1 (H) 3.6 - 11.0 K/uL   RBC 3.74 (L) 3.80 - 5.20 MIL/uL   Hemoglobin 10.0 (L)  12.0 - 16.0 g/dL   HCT 31.2 (L) 35.0 - 47.0 %   MCV 83.4 80.0 - 100.0 fL   MCH 26.7 26.0 - 34.0 pg   MCHC 32.0 32.0 - 36.0 g/dL   RDW 17.4 (H) 11.5 - 14.5 %   Platelets 296 150 - 440 K/uL    Comment: COUNT MAY BE INACCURATE DUE TO FIBRIN CLUMPS.  Glucose, capillary     Status: Abnormal   Collection Time: 08/17/16  4:59 PM  Result Value Ref Range   Glucose-Capillary 351 (H) 65 - 99 mg/dL   US Renal  Result Date: 08/17/2016 CLINICAL DATA:  Acute renal failure EXAM: RENAL / URINARY TRACT ULTRASOUND COMPLETE COMPARISON:  MRI 08/13/2013 FINDINGS: Right Kidney: Length: 14.9 cm. Polycystic disease, with the largest cyst measuring 4.6 cm. No hydronephrosis. Left Kidney: Length: 15.7 cm. Polycystic disease, with the largest cyst measuring up to 5.6 cm. No hydronephrosis. Bladder: Appears normal for degree of bladder distention. Incidentally noted is a left adrenal mass measuring 5.0 x 4.6 x 3.9 cm. 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 IMPRESSION: Polycystic kidney disease with enlarged polycystic kidneys bilaterally. No hydronephrosis. 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 Electronically Signed   By: Rolm Baptise M.D.   On: 08/17/2016 12:16    Blood pressure 134/63, pulse 80, temperature 97.7 F (36.5 C), temperature source Oral, resp. rate 15, height '5\' 6"'$  (1.676 m), weight 204 lb 14.4 oz (92.9 kg), SpO2 96 %.  Physical Exam: GEN.: She is awake and alert and oriented and walking easily. She did have some immediate acute dyspnea after walking to and from the bathroom and had to put her oxygen back on.  HEENT:  Head is normocephalic.  Pupils  are equal reactive to light.  Extraocular movements are intact. Sclera is clear.  Pharynx is clear.  SKIN: Warm and dry without rash.  NECK:  Supple with no palpable mass and no adenopathy.  LUNGS: She does have inspiratory wheezing no rales or rhonchi.  HEART:  Regular rhythm S1-S2, without murmur.  ABDOMEN: Morbidly  obese. There is a wide area of epigastric diastases recti. There is a localized smooth rounded palpable bulge up proximally 7 cm cephalad to the umbilicus consistent with small ventral hernia with properitoneal fat.  EXTREMITIES: Well-developed well-nourished were no current dependent edema.   NEUROLOGIC:  Awake alert and moving all extremities.  Assessment/Plan Large diastasis recti, small ventral hernia likely containing properitoneal fat Normocytic anemia Morbid obesity Chronic obstructive pulmonary disease, possible pneumonia Congestive heart failure Chronic kidney disease Poorly controlled adult onset diabetes mellitus  There is a plan for ultrasound  Hernia surgery is not recommended at present. She would be high risk for hernia repair. Repair of diastasis is not recommended. If this small localized ventral hernia does continue to bother her she could potentially have surgery but would need to have significant improvement in her pulmonary condition before surgery. Significant weight loss is also recommended.  Rochel Brome, MD 08/17/2016, 5:06 PM

## 2016-08-17 NOTE — Progress Notes (Signed)
[facility]  ROUNDING NOTE   Subjective:  Creatinine found to be slightly higher today. Currently creatinine is 1.8 with a BMI of 22. Her breathing has improved a bit today. Good urine output noted.  Objective:  Vital signs in last 24 hours:  Temp:  [97.7 F (36.5 C)-98.4 F (36.9 C)] 97.7 F (36.5 C) (06/05 1220) Pulse Rate:  [79-90] 80 (06/05 1220) Resp:  [15-20] 15 (06/05 1220) BP: (120-150)/(62-81) 134/63 (06/05 1220) SpO2:  [90 %-97 %] 96 % (06/05 1229) FiO2 (%):  [32 %] 32 % (06/05 1229) Weight:  [92.9 kg (204 lb 14.4 oz)] 92.9 kg (204 lb 14.4 oz) (06/05 0354)  Weight change: -0.181 kg (-6.4 oz) Filed Weights   08/15/16 0507 08/16/16 0409 08/17/16 0354  Weight: 98.6 kg (217 lb 4.6 oz) 93.1 kg (205 lb 4.8 oz) 92.9 kg (204 lb 14.4 oz)    Intake/Output: I/O last 3 completed shifts: In: 410 [P.O.:360; IV Piggyback:50] Out: 2975 [Urine:2975]   Intake/Output this shift:  Total I/O In: 120 [P.O.:120] Out: -   Physical Exam: General: No acute distress  Head: Normocephalic, atraumatic. Moist oral mucosal membranes  Eyes: Anicteric  Neck: Supple, trachea midline  Lungs:  Mild wheezing, normal effort  Heart: S1S2 no rubs  Abdomen:  Soft, nontender, bowel sounds present  Extremities: Trace peripheral edema.  Neurologic: Awake, alert, following commands  Skin: No lesions       Basic Metabolic Panel:  Recent Labs Lab 08/14/16 2028 08/15/16 0446  NA 142 137  K 3.7 4.0  CL 104 98*  CO2 27 28  GLUCOSE 219* 464*  BUN 17 22*  CREATININE 1.62* 1.83*  CALCIUM 9.0 8.5*    Liver Function Tests: No results for input(s): AST, ALT, ALKPHOS, BILITOT, PROT, ALBUMIN in the last 168 hours. No results for input(s): LIPASE, AMYLASE in the last 168 hours. No results for input(s): AMMONIA in the last 168 hours.  CBC:  Recent Labs Lab 08/14/16 2028 08/15/16 0446  WBC 13.9* 10.0  NEUTROABS 10.5*  --   HGB 10.1* 9.4*  HCT 32.1* 30.0*  MCV 86.5 85.3   PLT 306 266    Cardiac Enzymes:  Recent Labs Lab 08/14/16 2028  TROPONINI <0.03    BNP: Invalid input(s): POCBNP  CBG:  Recent Labs Lab 08/16/16 1153 08/16/16 1653 08/16/16 2100 08/17/16 0734 08/17/16 1218  GLUCAP 356* 227* 243* 323* 308*    Microbiology: Results for orders placed or performed during the hospital encounter of 08/14/16  Culture, blood (routine x 2)     Status: Abnormal (Preliminary result)   Collection Time: 08/14/16  8:28 PM  Result Value Ref Range Status   Specimen Description BLOOD LEFT FOREARM  Final   Special Requests   Final    BOTTLES DRAWN AEROBIC AND ANAEROBIC Blood Culture adequate volume   Culture  Setup Time   Final    Organism ID to follow [place] AND ANAEROBIC BOTTLES CRITICAL RESULT CALLED TO, READ BACK BY AND VERIFIED WITH: JASON ROBBINS 08/15/16 AT 1925 BY HS    Culture (A)  Final    STAPHYLOCOCCUS SPECIES (COAGULASE NEGATIVE) THE SIGNIFICANCE OF ISOLATING THIS ORGANISM FROM A SINGLE SET OF BLOOD CULTURES WHEN MULTIPLE SETS ARE DRAWN IS UNCERTAIN. PLEASE NOTIFY THE MICROBIOLOGY DEPARTMENT WITHIN ONE WEEK IF SPECIATION AND SENSITIVITIES ARE REQUIRED. Performed at [facility], [place] [address], [place], [place] [address]    Report Status PENDING  Incomplete  Culture, blood (routine x  2)     Status: None (Preliminary result)   Collection Time: 08/14/16  8:28 PM  Result Value Ref Range Status   Specimen Description BLOOD LEFT ANTECUBITAL  Final   Special Requests   Final    BOTTLES DRAWN AEROBIC AND ANAEROBIC Blood Culture adequate volume   Culture NO GROWTH 3 DAYS  Final   Report Status PENDING  Incomplete  Blood Culture ID Panel (Reflexed)     Status: Abnormal   Collection Time: 08/14/16  8:28 PM  Result Value Ref Range Status   Enterococcus species NOT DETECTED NOT DETECTED Final   Listeria monocytogenes NOT DETECTED NOT DETECTED Final   Staphylococcus species DETECTED (A) NOT DETECTED Final     Comment: Methicillin (oxacillin) susceptible coagulase negative staphylococcus. Possible blood culture contaminant (unless isolated from more than one blood culture draw or clinical case suggests pathogenicity). No antibiotic treatment is indicated for blood  culture contaminants. CRITICAL RESULT CALLED TO, READ BACK BY AND VERIFIED WITH: JASON ROBBINS 08/15/16 AT 1925 BY HS    Staphylococcus aureus NOT DETECTED NOT DETECTED Final   Methicillin resistance NOT DETECTED NOT DETECTED Final   Streptococcus species NOT DETECTED NOT DETECTED Final   Streptococcus agalactiae NOT DETECTED NOT DETECTED Final   Streptococcus pneumoniae NOT DETECTED NOT DETECTED Final   Streptococcus pyogenes NOT DETECTED NOT DETECTED Final   Acinetobacter baumannii NOT DETECTED NOT DETECTED Final   Enterobacteriaceae species NOT DETECTED NOT DETECTED Final   Enterobacter cloacae complex NOT DETECTED NOT DETECTED Final   Escherichia coli NOT DETECTED NOT DETECTED Final   Klebsiella oxytoca NOT DETECTED NOT DETECTED Final   Klebsiella pneumoniae NOT DETECTED NOT DETECTED Final   Proteus species NOT DETECTED NOT DETECTED Final   Serratia marcescens NOT DETECTED NOT DETECTED Final   Haemophilus influenzae NOT DETECTED NOT DETECTED Final   Neisseria meningitidis NOT DETECTED NOT DETECTED Final   Pseudomonas aeruginosa NOT DETECTED NOT DETECTED Final   Candida albicans NOT DETECTED NOT DETECTED Final   Candida glabrata NOT DETECTED NOT DETECTED Final   Candida krusei NOT DETECTED NOT DETECTED Final   Candida parapsilosis NOT DETECTED NOT DETECTED Final   Candida tropicalis NOT DETECTED NOT DETECTED Final    Coagulation Studies: No results for input(s): LABPROT, INR in the last 72 hours.  Urinalysis: No results for input(s): COLORURINE, LABSPEC, PHURINE, GLUCOSEU, HGBUR, BILIRUBINUR, KETONESUR, PROTEINUR, UROBILINOGEN, NITRITE, LEUKOCYTESUR in the last 72 hours.  Invalid input(s): APPERANCEUR    Imaging: US  Renal  Result Date: 08/17/2016 CLINICAL DATA:  Acute renal failure EXAM: RENAL / URINARY TRACT ULTRASOUND COMPLETE COMPARISON:  MRI 08/13/2013 FINDINGS: Right Kidney: Length: 14.9 cm. Polycystic Taylor, with the largest cyst measuring 4.6 cm. No hydronephrosis. Left Kidney: Length: 15.7 cm. Polycystic Taylor, with the largest cyst measuring up to 5.6 cm. No hydronephrosis. Bladder: Appears normal for degree of bladder distention. Incidentally noted is a left adrenal mass measuring 5.0 x 4.6 x 3.9 cm. 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 IMPRESSION: Polycystic kidney Taylor with enlarged polycystic kidneys bilaterally. No hydronephrosis. 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 Electronically Signed   By: Rolm Baptise M.D.   On: 08/17/2016 12:16     Medications:   . cefTRIAXone (ROCEPHIN) IVPB 1 gram/50 mL D5W Stopped (08/17/16 0842)   . amLODipine  10 mg Oral Daily  . atorvastatin  40 mg Oral Daily  . azithromycin  500 mg Oral Daily  . FLUoxetine  40 mg Oral Daily  .  heparin  5,000 Units Subcutaneous Q8H  . insulin aspart  0-15 Units Subcutaneous TID WC  . insulin aspart  0-5 Units Subcutaneous QHS  . insulin aspart  6 Units Subcutaneous TID WC  . insulin detemir  15 Units Subcutaneous Daily  . ipratropium-albuterol  3 mL Nebulization QID  . methylPREDNISolone (SOLU-MEDROL) injection  60 mg Intravenous Q6H  . metoprolol tartrate  25 mg Oral BID  . pantoprazole  40 mg Oral Daily  . traZODone  50 mg Oral QHS  . umeclidinium bromide  1 puff Inhalation Daily   acetaminophen **OR** acetaminophen, albuterol, benzonatate, guaiFENesin-dextromethorphan, labetalol, ondansetron **OR** ondansetron (ZOFRAN) IV, oxyCODONE-acetaminophen **AND** oxyCODONE  Assessment/ Plan:  76 y.o. female with a PMHx of Polycystic kidney Taylor, Mckenzie Taylor, Mckenzie Taylor, Mckenzie systolic heart failure ejection fraction 40-45%, Mckenzie kidney Taylor  stage Taylor, Mckenzie Taylor, Mckenzie Taylor, Mckenzie Taylor, Mckenzie Taylor, Mckenzie was admitted to [facility] on 08/14/2016 for evaluation of shortness of breath and acute systolic heart failure exacerbation.  1.  Acute renal failure. 2.  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  Anemia of CKD. 4.  Mckenzie Taylor. 5.  Acute on Mckenzie systolic heart failure.    Plan:  Renal function slightly worse today with a creatinine 1.8.  Patient is currently off of diuretic therapy. She is also being treated for pneumonia with azithromycin and ceftriaxone. Patient has confirmed polycystic kidney Taylor based upon renal ultrasound. She will need long-term follow-up for this issue.  Continue to monitor hemoglobin as well for anemia of Mckenzie kidney Taylor. No urgent indication for Epogen at the moment.   LOS: 3 Knox Holdman 6/5/201812:47 PM

## 2016-08-17 NOTE — Progress Notes (Signed)
[facility] Cardiology [facility] Encounter Note  Patient: Mckenzie Taylor / Admit Date: 08/14/2016 / Date of Encounter: 08/17/2016, 8:23 AM   Subjective: Patient weak fatigue tired and short of breath with still some cough and congestion due to pneumonia. No evidence of chest discomfort or significant tachycardia. Heart failure improved at this time  Review of Systems: Positive for: Shortness of breath cough congestion with some improvements Negative for: Vision change, hearing change, syncope, dizziness, nausea, vomiting,diarrhea, bloody stool, stomach pain, positive for cough, congestion, negative for diaphoresis, urinary frequency, urinary pain,skin lesions, skin rashes Others previously listed  Objective: Telemetry: Sinus rhythm Physical Exam: Blood pressure 137/62, pulse 79, temperature 97.9 F (36.6 C), resp. rate 18, height 5\' 6"  (1.676 m), weight 92.9 kg (204 lb 14.4 oz), SpO2 97 %. Body mass index is 33.07 kg/m. General: Well developed, well nourished, in no acute distress. Head: Normocephalic, atraumatic, sclera non-icteric, no xanthomas, nares are without discharge. Neck: No apparent masses Lungs: Normal respirations with diffuse wheezes, some rhonchi, no rales , no crackles   Heart: Regular rate and rhythm, normal S1 S2, no murmur, no rub, no gallop, PMI is normal size and placement, carotid upstroke normal without bruit, jugular venous pressure normal Abdomen: Soft, non-tender,  distended with normoactive bowel sounds. No hepatosplenomegaly. Abdominal aorta is normal size without bruit Extremities: Trace edema, no clubbing, no cyanosis, no ulcers,  Peripheral: 2+ radial, 2+ femoral, 2+ dorsal pedal pulses Neuro: Alert and oriented. Moves all extremities spontaneously. Psych:  Responds to questions appropriately with a normal affect.   Intake/Output Summary (Last 24 hours) at 08/17/16 0823 Last data filed at 08/17/16 9798  Gross per 24 hour  Intake              410 ml   Output             2475 ml  Net            -2065 ml    Inpatient Medications:  . amLODipine  10 mg Oral Daily  . atorvastatin  40 mg Oral Daily  . azithromycin  500 mg Oral Daily  . FLUoxetine  40 mg Oral Daily  . heparin  5,000 Units Subcutaneous Q8H  . insulin aspart  0-15 Units Subcutaneous TID WC  . insulin aspart  0-5 Units Subcutaneous QHS  . insulin aspart  6 Units Subcutaneous TID WC  . insulin detemir  15 Units Subcutaneous Daily  . ipratropium-albuterol  3 mL Nebulization QID  . methylPREDNISolone (SOLU-MEDROL) injection  60 mg Intravenous Q6H  . metoprolol tartrate  25 mg Oral BID  . pantoprazole  40 mg Oral Daily  . traZODone  50 mg Oral QHS  . umeclidinium bromide  1 puff Inhalation Daily   Infusions:  . cefTRIAXone (ROCEPHIN) IVPB 1 gram/50 mL D5W 1 g (08/17/16 0812)    Labs:  Recent Labs  08/14/16 2028 08/15/16 0446  NA 142 137  K 3.7 4.0  CL 104 98*  CO2 27 28  GLUCOSE 219* 464*  BUN 17 22*  CREATININE 1.62* 1.83*  CALCIUM 9.0 8.5*   No results for input(s): AST, ALT, ALKPHOS, BILITOT, PROT, ALBUMIN in the last 72 hours.  Recent Labs  08/14/16 2028 08/15/16 0446  WBC 13.9* 10.0  NEUTROABS 10.5*  --   HGB 10.1* 9.4*  HCT 32.1* 30.0*  MCV 86.5 85.3  PLT 306 266    Recent Labs  08/14/16 2028  TROPONINI <0.03   Invalid input(s): POCBNP No results for  input(s): HGBA1C in the last 72 hours.   Weights: Filed Weights   08/15/16 0507 08/16/16 0409 08/17/16 0354  Weight: 98.6 kg (217 lb 4.6 oz) 93.1 kg (205 lb 4.8 oz) 92.9 kg (204 lb 14.4 oz)     Radiology/Studies:  Dg Chest Port 1 View  Result Date: 08/14/2016 CLINICAL DATA:  Patient with acute onset shortness of breath. EXAM: PORTABLE CHEST 1 VIEW COMPARISON:  Chest radiograph 05/06/2016 FINDINGS: Monitoring leads overlie the patient. Marked cardiomegaly. Right-greater-than-left basilar heterogeneous pulmonary opacities. No pleural effusion or pneumothorax. IMPRESSION: Heterogeneous  opacities within the right-greater-than-left lung bases bilaterally favored to represent atelectasis. Infection not excluded. Electronically Signed   By: Lovey Newcomer M.D.   On: 08/14/2016 20:48     Assessment and Recommendation  76 y.o. female with the known chronic kidney disease anemia diabetes with complication apparent previous history of coronary artery disease having acute diastolic and systolic dysfunction congestive heart failure secondary to hypoxia, and pneumonia without evidence of myocardial infarction 1. Continue supportive care and treatment of pneumonia and hypoxia as the primary condition and issue slowly improving 2. No further intervention from the cardiac standpoint without evidence of myocardial infarction 3. Diuresis for any pulmonary edema which may occur watching closely for chronic kidney disease issues as necessary 4. Begin ambulation and recovery from above and further treatment options after above 5. Okay for discharge home from cardiac standpoint with follow-up in one to 2 weeks  Signed, Serafina Royals M.D. FACC

## 2016-08-17 NOTE — Plan of Care (Signed)
Problem: Pain Managment: Goal: General experience of comfort will improve Outcome: Progressing No complaints of pain this shift will continue to monitor.  Problem: Tissue Perfusion: Goal: Risk factors for ineffective tissue perfusion will decrease Outcome: Progressing Heparin subcutaneously for VTE.  Problem: Respiratory: Goal: Ability to maintain normal oxygenation or baseline will improve Outcome: Completed/Met Date Met: 08/17/16 3L O2 which is chronic , pt on cpap at night

## 2016-08-17 NOTE — Progress Notes (Signed)
[place] [facility] [place] NAME: Mckenzie Taylor    MR#:  [id number]  DATE OF BIRTH:  05-06-1940  SUBJECTIVE:  CHIEF COMPLAINT:  Patient's shortness of breath is better ,Reporting abdominal pain and hernia REVIEW OF SYSTEMS:  CONSTITUTIONAL: No fever, fatigue or weakness.  EYES: No blurred or double vision.  EARS, NOSE, AND THROAT: No tinnitus or ear pain.  RESPIRATORY: reports cough, shortness of breath, no wheezing or hemoptysis.  CARDIOVASCULAR: No chest pain, orthopnea, edema.  GASTROINTESTINAL: No nausea, vomiting, diarrhea . Reporting abdominal pain.  GENITOURINARY: No dysuria, hematuria.  ENDOCRINE: No polyuria, nocturia,  HEMATOLOGY: No anemia, easy bruising or bleeding SKIN: No rash or lesion. MUSCULOSKELETAL: No joint pain or arthritis.   NEUROLOGIC: No tingling, numbness, weakness.  PSYCHIATRY: No anxiety or depression.   DRUG ALLERGIES:  No Known Allergies  VITALS:  Blood pressure 134/63, pulse 80, temperature 97.7 F (36.5 C), temperature source Oral, resp. rate 15, height 5\' 6"  (1.676 m), weight 92.9 kg (204 lb 14.4 oz), SpO2 96 %.  PHYSICAL EXAMINATION:  GENERAL:  76 y.o.-year-old patient lying in the bed with no acute distress.  EYES: Pupils equal, round, reactive to light and accommodation. No scleral icterus. Extraocular muscles intact.  HEENT: Head atraumatic, normocephalic. Oropharynx and nasopharynx clear.  NECK:  Supple, no jugular venous distention. No thyroid enlargement, no tenderness.  LUNGS: Diminished breath sounds bilaterally, min wheezing, rales,rhonchi or crepitation. No use of accessory muscles of respiration.  CARDIOVASCULAR: S1, S2 normal. No murmurs, rubs, or gallops.  ABDOMEN: Soft, nontender, Abdominal wall hernia is reducible Bowel sounds present.   EXTREMITIES: No pedal edema, cyanosis, or clubbing.  NEUROLOGIC: Cranial nerves II through XII are intact. Muscle strength 5/5 in all extremities.  Sensation intact. Gait not checked.  PSYCHIATRIC: The patient is alert and oriented x 3.  SKIN: No obvious rash, lesion, or ulcer.    LABORATORY PANEL:   CBC  Recent Labs Lab 08/17/16 1301  WBC 11.1*  HGB 10.0*  HCT 31.2*  PLT 296   ------------------------------------------------------------------------------------------------------------------  Chemistries   Recent Labs Lab 08/17/16 1236  NA 140  K 4.5  CL 100*  CO2 30  GLUCOSE 305*  BUN 42*  CREATININE 1.56*  CALCIUM 8.9   ------------------------------------------------------------------------------------------------------------------  Cardiac Enzymes  Recent Labs Lab 08/14/16 2028  TROPONINI <0.03   ------------------------------------------------------------------------------------------------------------------  RADIOLOGY:  US Renal  Result Date: 08/17/2016 CLINICAL DATA:  Acute renal failure EXAM: RENAL / URINARY TRACT ULTRASOUND COMPLETE COMPARISON:  MRI 08/13/2013 FINDINGS: Right Kidney: Length: 14.9 cm. Polycystic disease, with the largest cyst measuring 4.6 cm. No hydronephrosis. Left Kidney: Length: 15.7 cm. Polycystic disease, with the largest cyst measuring up to 5.6 cm. No hydronephrosis. Bladder: Appears normal for degree of bladder distention. Incidentally noted is a left adrenal mass measuring 5.0 x 4.6 x 3.9 cm. 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 IMPRESSION: Polycystic kidney disease with enlarged polycystic kidneys bilaterally. No hydronephrosis. 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 Electronically Signed   By: Rolm Baptise M.D.   On: 08/17/2016 12:16    EKG:   Orders placed or performed during the hospital encounter of 08/14/16  . ED EKG  . ED EKG  . EKG 12-Lead  . EKG 12-Lead    ASSESSMENT AND PLAN:   AKI  Renal function is better. And 1.8-1.5 Patient was on Lasix for acute CHF exacerbation  will hold lasix Avoid other nephrotoxins and Follow  up with nephrology Hold ACE inhibitor  CAP (community acquired pneumonia) - IV antibiotics Rocephin and azithromycin, when necessary duo nebs duo nebs, when necessary antitussive Supportive care    COPD with acute exacerbation ([place]) - IV Solu-Medrol, nebulizer rx , IV Rocephin and azithromycin  Periumbilical abdominal pain-probably from iR  reducible abdominal wall hernia We'll get abdominal ultrasound consult Dr. Tamala Julian, patient was seen by Dr. Tamala Julian in the past     Acute systolic CHF (congestive heart failure) ([place]) - IV Lasix given . Continue Lasix by mouth  Daily weight monitoring  Intake and output  Continue statin and hold ACE inhibitor and Lasix as renal function is getting worse, will add beta blocker if blood pressure permits Follow-up with cardiology   hypertension - blood pressure is better. Continue home medications Norvasc, metoprolol dose increased to 25 as we are holding enalapril and Lasix     Diabetes (HCC) - sliding scale insulin with corresponding glucose checks Levemir 15 units daily at bedtime and NovoLog 6 units 3 times a day    All the records are reviewed and case discussed with Care Management/Social Workerr. Management plans discussed with the patient, family and they are in agreement.  CODE STATUS: fc   TOTAL TIME TAKING CARE OF THIS PATIENT: 36` minutes.   POSSIBLE D/C IN 2 DAYS, DEPENDING ON CLINICAL CONDITION.  Note: This dictation was prepared with Dragon dictation along with smaller phrase technology. Any transcriptional errors that result from this process are unintentional.   Mckenzie Taylor M.D on 08/17/2016 at 3:58 PM  Between 7am to 6pm - Pager - [phone] After 6pm go to [email or web address] - password [email or web address] [facility]  [facility]  Office  [phone]  CC: Primary care physician; Denton Lank, MD

## 2016-08-17 NOTE — Progress Notes (Signed)
PT Cancellation Note  Patient Details Name: Mckenzie Taylor MRN: [id number] DOB: 01-03-1941   Cancelled Treatment:    Reason Eval/Treat Not Completed: Other (comment). Consult received and chart reviewed. Evaluation attempted, however pt currently out of room for imaging. Will re-attempt, time permitting.   Angella Montas 08/17/2016, 11:30 AM  Greggory Stallion, PT, DPT [phone]

## 2016-08-17 NOTE — Progress Notes (Signed)
3 L of oxygen. Sinus tachy. Takes meds ok. FS are stable. US renal was negative. Up to [facility] and tolerated it well. Pt has no further concerns at this time.

## 2016-08-18 LAB — CULTURE, BLOOD (ROUTINE X 2): Special Requests: ADEQUATE

## 2016-08-18 LAB — GLOMERULAR BASEMENT MEMBRANE ANTIBODIES: GBM Ab: 2 units (ref 0–20)

## 2016-08-18 LAB — BASIC METABOLIC PANEL
Anion gap: 7 (ref 5–15)
BUN: 42 mg/dL — ABNORMAL HIGH (ref 6–20)
CO2: 32 mmol/L (ref 22–32)
Calcium: 8.4 mg/dL — ABNORMAL LOW (ref 8.9–10.3)
Chloride: 102 mmol/L (ref 101–111)
Creatinine, Ser: 1.56 mg/dL — ABNORMAL HIGH (ref 0.44–1.00)
GFR calc Af Amer: 37 mL/min — ABNORMAL LOW (ref >60)
GFR calc non Af Amer: 32 mL/min — ABNORMAL LOW (ref >60)
Glucose, Bld: 188 mg/dL — ABNORMAL HIGH (ref 65–99)
Potassium: 3.9 mmol/L (ref 3.5–5.1)
Sodium: 141 mmol/L (ref 135–145)

## 2016-08-18 LAB — PROTEIN ELECTROPHORESIS, SERUM
A/G Ratio: 1.1 (ref 0.7–1.7)
Albumin ELP: 3.4 g/dL (ref 2.9–4.4)
Alpha-1-Globulin: 0.2 g/dL (ref 0.0–0.4)
Alpha-2-Globulin: 1.2 g/dL — ABNORMAL HIGH (ref 0.4–1.0)
Beta Globulin: 1.1 g/dL (ref 0.7–1.3)
Gamma Globulin: 0.7 g/dL (ref 0.4–1.8)
Globulin, Total: 3.2 g/dL (ref 2.2–3.9)
Total Protein ELP: 6.6 g/dL (ref 6.0–8.5)

## 2016-08-18 LAB — ANA W/REFLEX IF POSITIVE: Anti Nuclear Antibody(ANA): NEGATIVE

## 2016-08-18 LAB — GLUCOSE, CAPILLARY
Glucose-Capillary: 240 mg/dL — ABNORMAL HIGH (ref 65–99)
Glucose-Capillary: 374 mg/dL — ABNORMAL HIGH (ref 65–99)
Glucose-Capillary: 168 mg/dL — ABNORMAL HIGH (ref 65–99)
Glucose-Capillary: 170 mg/dL — ABNORMAL HIGH (ref 65–99)

## 2016-08-18 LAB — C3 COMPLEMENT: C3 Complement: 154 mg/dL (ref 82–167)

## 2016-08-18 LAB — CBC
HCT: 31.3 % — ABNORMAL LOW (ref 35.0–47.0)
Hemoglobin: 10 g/dL — ABNORMAL LOW (ref 12.0–16.0)
MCH: 26.8 pg (ref 26.0–34.0)
MCHC: 32 g/dL (ref 32.0–36.0)
MCV: 83.7 fL (ref 80.0–100.0)
Platelets: 313 10*3/uL (ref 150–440)
RBC: 3.74 MIL/uL — ABNORMAL LOW (ref 3.80–5.20)
RDW: 17.1 % — ABNORMAL HIGH (ref 11.5–14.5)
WBC: 9.9 10*3/uL (ref 3.6–11.0)

## 2016-08-18 LAB — C4 COMPLEMENT: Complement C4, Body Fluid: 42 mg/dL (ref 14–44)

## 2016-08-18 LAB — MPO/PR-3 (ANCA) ANTIBODIES
ANCA Proteinase 3: <3.5 U/mL (ref 0.0–3.5)
Myeloperoxidase Abs: <9 U/mL (ref 0.0–9.0)

## 2016-08-18 MED ORDER — FUROSEMIDE 20 MG PO TABS
20.0000 mg | ORAL_TABLET | Freq: Every day | ORAL | Status: DC
  Administered 2016-08-18 – 2016-08-19 (×2): 20 mg via ORAL
  Filled 2016-08-18 (×2): qty 1

## 2016-08-18 NOTE — Plan of Care (Signed)
Problem: Pain Managment: Goal: General experience of comfort will improve Outcome: Progressing Pt treated for back and abdomen pain once with oxycodone with relief. Will continue to monitor.  Problem: Tissue Perfusion: Goal: Risk factors for ineffective tissue perfusion will decrease Outcome: Progressing Heparin subcutaneously for VTE  Problem: Respiratory: Goal: Ability to achieve and maintain a regular respiratory rate will improve Outcome: Completed/Met Date Met: 08/18/16 Pt back on 3L o2 which is chronic for her

## 2016-08-18 NOTE — Progress Notes (Signed)
Her chief complaint for purposes of surgery consultation is pain in a localized area of the epigastrium.  She has had a epigastric ventral hernia which dates back to 2016.  She is in the hospital with exacerbation of chronic obstructive pulmonary disease and possible pneumonia, heart failure.  I saw her for surgery consultation yesterday.  After her consultation she did have ultrasound of the abdominal wall in the epigastrium.  I reviewed these ultrasound images demonstrating herniated properitoneal fat in the epigastrium.  There is a flux of fatty tissue in and out of the hernia defect associated with respirations.  There appeared to be no bowel in the hernia.  Today she reports she is somewhat worse as far as her breathing.  She does have a frequent cough.  She has  however been walking in the hall some for exercise.  On examination she appears to be in some distress with productive coughing some wheezing.  I can feel a localized area of tenderness in the epigastrium consistent with the site of the ventral area.  There is also a wide area of diastases recti.  Impression is a small symptomatic epigastric ventral hernia containing properitoneal fat.  Large area of diastases recti.  It does not appear to be safe to do any hernia surgery now with her current respiratory symptoms.  I have discussed with her if she does significantly improve she can make an appointment after several weeks or several months in the office to reexamine and consider elective hernia repair.  I suggested in the long-term she work on weight loss with a combination of dieting and exercise.

## 2016-08-18 NOTE — Progress Notes (Signed)
Inpatient Diabetes Program Recommendations  AACE/ADA: New Consensus Statement on Inpatient Glycemic Control (2015)  Target Ranges:  Prepandial:   less than 140 mg/dL      Peak postprandial:   less than 180 mg/dL (1-2 hours)      Critically ill patients:  140 - 180 mg/dL   Results for MAANSI, WIKE (MRN [id number]) as of 08/18/2016 11:43  Ref. Range 08/17/2016 07:34 08/17/2016 12:18 08/17/2016 16:59 08/17/2016 21:13  Glucose-Capillary Latest Ref Range: 65 - 99 mg/dL 323 (H) 308 (H) 351 (H) 179 (H)   Results for PINKY, RAVAN (MRN [id number]) as of 08/18/2016 11:43  Ref. Range 08/18/2016 07:34 08/18/2016 11:37  Glucose-Capillary Latest Ref Range: 65 - 99 mg/dL 240 (H) 374 (H)    Home DM Meds: Levemir 15 units QHS                             Novolog 2-10 units TID                             Metformin 500 mg BID                             Glipizide 2.5 mg daily  Current Insulin Orders: Novolog Moderate Correction Scale/ SSI (0-15 units) TID AC + HS      Levemir 15 units daily       Novolog 6 units TID with meals      MD- Note patient receiving Solumedrol 40 mg BID.  CBGs still quite elevated.    Please consider the following:  1. Increase Levemir to 20 units QHS  2. Increase Novolog Meal Coverage to: Novolog 8 units TID with meals (hold if pt eats <50% of meal)     --Will follow patient during hospitalization--  Wyn Quaker RN, MSN, CDE Diabetes Coordinator Inpatient Glycemic Control Team Team Pager: [phone] (8a-5p)

## 2016-08-18 NOTE — Plan of Care (Signed)
Problem: Respiratory: Goal: Ability to maintain normal oxygenation or baseline will improve Outcome: Progressing RN will administer oxygen per order at patient's chronic level of 3L per [place].

## 2016-08-18 NOTE — Progress Notes (Signed)
Physical Therapy Evaluation Patient Details Name: Mckenzie Taylor MRN: [id number] DOB: 1941-02-16 Today's Date: 08/18/2016   History of Present Illness  Pt is a 76 y/o female admitted to the ED on 08/14/2016 with complaints of SOB, increased coughing, and sputum production. Pt diagnosed with pneumonia and admitted for COPD exacerbation. PMH includes COPD, CHF, HTN, GERD, DM, and CAD. Pt is a COPD Gold member. 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 As of this date, no surgery planned to repair hernia secondary to medical status.    Clinical Impression  Pt is a pleasant 76 year old F who was admitted for pneumonia and COPD exacerbation. Pt performs bed mobility, transfers, and ambulation with supervision for safety. Pt demonstrates deficits with endurance, demonstrated by pt's fatigue and slow gait speed when ambulating. Pt's SaO2 on 3L O2 at rest = 94%. SaO2 on 3L of O2 while ambulating = 93%. Would benefit from skilled PT to address above deficits and promote optimal return to PLOF    Follow Up Recommendations Home health PT    Equipment Recommendations  None recommended by PT    Recommendations for Other Services       Precautions / Restrictions Precautions Precautions: Fall Restrictions Weight Bearing Restrictions: No      Mobility  Bed Mobility Overal bed mobility:  (Pt at EOB upon arrival to room. Returned to EOB after PT.)                Transfers Overall transfer level: Needs assistance Equipment used: None (RW used once outside of room.) Transfers: Sit to/from Stand Sit to Stand: Supervision         General transfer comment: PT present for safety. No LOB or dizziness noted once standing.  Ambulation/Gait Ambulation/Gait assistance: Supervision Ambulation Distance (Feet): 20 Feet Assistive device: None (Furniture crawl from EOB to door. 3L O2 used.) Gait Pattern/deviations:  Step-through pattern;Wide base of support;Trunk flexed     General Gait Details: Pt fatigued quickly after ambulating for 46ft. No rest break (seated or standing) was needed during 264ft. PT monitored SAO2 which remained at 93% after ambulating. 3L of O2 used while ambulating.  Stairs            Wheelchair Mobility    Modified Rankin (Stroke Patients Only)       Balance Overall balance assessment: Needs assistance Sitting-balance support: Feet unsupported   Sitting balance - Comments: No LOB noted when seated.   Standing balance support: Single extremity supported (Pt tended to reach for furniture to maintain balance/safety.)                                 Pertinent Vitals/Pain Pain Assessment: No/denies pain    Home Living Family/patient expects to be discharged to:: Private residence Living Arrangements: Other relatives (cousin (she is independent and helps care for pt PRN)) Available Help at Discharge: Family;Available 24 hours/day Type of Home: House Home Access: Stairs to enter Entrance Stairs-Rails: None (no railing in back where pt plans to enter/exit her home) Entrance Stairs-Number of Steps: 1 (short step in back with no railings, 4 in front with railing) Home Layout: One level Home Equipment: Walker - 2 wheels;Cane - single point;Bedside commode;Wheelchair - manual Additional Comments: Pt ambulated with cane when outside her home. No AD used within her home.    Prior Function Level of Independence:  Independent with assistive device(s)         Comments: No AD within her home.     Hand Dominance        Extremity/Trunk Assessment   Upper Extremity Assessment Upper Extremity Assessment:  (B UE grossly 5/5 for elbow flex/ext, 4/5 for grip)    Lower Extremity Assessment Lower Extremity Assessment:  (grossly 5/5 for hip flexion, knee flex/ext, PF/DF)    Cervical / Trunk Assessment Cervical / Trunk Assessment: Kyphotic   Communication   Communication: No difficulties  Cognition Arousal/Alertness: Awake/alert Behavior During Therapy: WFL for tasks assessed/performed Overall Cognitive Status: Within Functional Limits for tasks assessed                                        General Comments      Exercises Other Exercises Other Exercises: Pt ambulated 152ft with RW and 3L of O2 with supervision. Pt required verbal cueing to focus on her breathing as she fatigued quickly after ambulating 59ft. Pt was mildly SOB after ambulating, but her SAO2 was 93% with 3L O2.   Assessment/Plan    PT Assessment Patient needs continued PT services  PT Problem List Decreased activity tolerance;Cardiopulmonary status limiting activity       PT Treatment Interventions Gait training;Stair training;Therapeutic exercise    PT Goals (Current goals can be found in the Care Plan section)  Acute Rehab PT Goals Patient Stated Goal: to go home and not come back to hospital PT Goal Formulation: With patient Time For Goal Achievement: 09/01/16 Potential to Achieve Goals: Good Additional Goals Additional Goal #1: Will be able to properly manage her O2 usage at home to maintain optimal O2 saturation to improve her function and endurance.    Frequency Min 2X/week   Barriers to discharge        Co-evaluation               AM-PAC PT "6 Clicks" Daily Activity  Outcome Measure Difficulty turning over in bed (including adjusting bedclothes, sheets and blankets)?: None Difficulty moving from lying on back to sitting on the side of the bed? : None Difficulty sitting down on and standing up from a chair with arms (e.g., wheelchair, bedside commode, etc,.)?: None Help needed moving to and from a bed to chair (including a wheelchair)?: None Help needed walking in hospital room?: None Help needed climbing 3-5 steps with a railing? : A Little 6 Click Score: 23    End of Session Equipment Utilized During  Treatment: Gait belt;Oxygen (Pt on 3L O2 when ambulating. O2 sats remained at 93%.) Activity Tolerance: Patient tolerated treatment well Patient left: with call bell/phone within reach;Other (comment) (EOB) Nurse Communication: Mobility status PT Visit Diagnosis: Other abnormalities of gait and mobility (R26.89) (endurance)    Time: 5681-2751 PT Time Calculation (min) (ACUTE ONLY): 23 min   Charges:   PT Evaluation $PT Eval Low Complexity: 1 Procedure PT Treatments $Gait Training: 8-22 mins   PT G Codes:        Donaciano Eva, PT, SPT  Decoda Van 08/18/2016, 1:07 PM

## 2016-08-18 NOTE — Plan of Care (Signed)
Problem: Safety: Goal: Ability to remain free from injury will improve Outcome: Progressing Pt is indep in the room but encouraged to call out for assistance if needed.

## 2016-08-18 NOTE — Progress Notes (Signed)
[facility] [place] NAME: Mckenzie Taylor    MR#:  [id number]  DATE OF BIRTH:  05/23/1940  SUBJECTIVE:  CHIEF COMPLAINT:  Patient's shortness of breath is slightly worse with wheezing and crepitations today. REVIEW OF SYSTEMS:  CONSTITUTIONAL: No fever, fatigue or weakness.  EYES: No blurred or double vision.  EARS, NOSE, AND THROAT: No tinnitus or ear pain.  RESPIRATORY: reports cough, shortness of breath, no wheezing or hemoptysis.  CARDIOVASCULAR: No chest pain, orthopnea, edema.  GASTROINTESTINAL: No nausea, vomiting, diarrhea . Reporting abdominal pain.  GENITOURINARY: No dysuria, hematuria.  ENDOCRINE: No polyuria, nocturia,  HEMATOLOGY: No anemia, easy bruising or bleeding SKIN: No rash or lesion. MUSCULOSKELETAL: No joint pain or arthritis.   NEUROLOGIC: No tingling, numbness, weakness.  PSYCHIATRY: No anxiety or depression.   DRUG ALLERGIES:  No Known Allergies  VITALS:  Blood pressure (!) 142/74, pulse 85, temperature 98.2 F (36.8 C), temperature source Oral, resp. rate 18, height 5\' 6"  (1.676 m), weight 89.8 kg (197 lb 14.4 oz), SpO2 95 %.  PHYSICAL EXAMINATION:  GENERAL:  76 y.o.-year-old patient lying in the bed with no acute distress.  EYES: Pupils equal, round, reactive to light and accommodation. No scleral icterus. Extraocular muscles intact.  HEENT: Head atraumatic, normocephalic. Oropharynx and nasopharynx clear.  NECK:  Supple, no jugular venous distention. No thyroid enlargement, no tenderness.  LUNGS: Diminished breath sounds bilaterally, min wheezing, some crepitation. No use of accessory muscles of respiration.  CARDIOVASCULAR: S1, S2 normal. No murmurs, rubs, or gallops.  ABDOMEN: Soft, nontender, Abdominal wall hernia is reducible Bowel sounds present.   EXTREMITIES: No pedal edema, cyanosis, or clubbing.  NEUROLOGIC: Cranial nerves II through XII are intact. Muscle strength 5/5 in all extremities.  Sensation intact. Gait not checked.  PSYCHIATRIC: The patient is alert and oriented x 3.  SKIN: No obvious rash, lesion, or ulcer.    LABORATORY PANEL:   CBC  Recent Labs Lab 08/18/16 0451  WBC 9.9  HGB 10.0*  HCT 31.3*  PLT 313   ------------------------------------------------------------------------------------------------------------------  Chemistries   Recent Labs Lab 08/18/16 0451  NA 141  K 3.9  CL 102  CO2 32  GLUCOSE 188*  BUN 42*  CREATININE 1.56*  CALCIUM 8.4*   ------------------------------------------------------------------------------------------------------------------  Cardiac Enzymes  Recent Labs Lab 08/14/16 2028  TROPONINI <0.03   ------------------------------------------------------------------------------------------------------------------  RADIOLOGY:  US Renal  Result Date: 08/17/2016 CLINICAL DATA:  Acute renal failure EXAM: RENAL / URINARY TRACT ULTRASOUND COMPLETE COMPARISON:  MRI 08/13/2013 FINDINGS: Right Kidney: Length: 14.9 cm. Polycystic disease, with the largest cyst measuring 4.6 cm. No hydronephrosis. Left Kidney: Length: 15.7 cm. Polycystic disease, with the largest cyst measuring up to 5.6 cm. No hydronephrosis. Bladder: Appears normal for degree of bladder distention. Incidentally noted is a left adrenal mass measuring 5.0 x 4.6 x 3.9 cm. 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 IMPRESSION: Polycystic kidney disease with enlarged polycystic kidneys bilaterally. No hydronephrosis. 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 Electronically Signed   By: Rolm Baptise M.D.   On: 08/17/2016 12:16   US Abdomen Limited  Result Date: 08/17/2016 CLINICAL DATA:  History of umbilical hernia repair 2-3 years ago. Evaluate for abdominal wall hernia. EXAM: ULTRASOUND ABDOMEN LIMITED COMPARISON:  None. FINDINGS: Examination demonstrates evidence of a midline abdominal wall hernia just above the umbilicus containing a  moderate collection of peritoneal fat measuring approximately 2.1 x 4.8 x 4.9 cm. No evidence of  bowel loops within the hernia sac. IMPRESSION: Findings compatible with a midline ventral hernia just above the umbilicus containing peritoneal fat. Electronically Signed   By: Marin Olp M.D.   On: 08/17/2016 18:13    EKG:   Orders placed or performed during the hospital encounter of 08/14/16  . ED EKG  . ED EKG  . EKG 12-Lead  . EKG 12-Lead    ASSESSMENT AND PLAN:   * AKI  Renal function is better. And 1.8-1.5 Patient was on Lasix for acute CHF exacerbation  held lasix Avoid other nephrotoxins and Follow up with nephrology Hold ACE inhibitor   Better.  * CAP (community acquired pneumonia) - IV antibiotics Rocephin and azithromycin, when necessary duo nebs duo nebs, when necessary antitussive Supportive care   * COPD with acute exacerbation ([place]) - IV Solu-Medrol, nebulizer rx , IV Rocephin and azithromycin  * Periumbilical abdominal pain-probably from irreducible abdominal wall hernia Got abdominal ultrasound- peritoneal fat is herniated, no intestine consult Dr. Tamala Julian, patient was seen by Dr. Tamala Julian in the past- no need for surgery.   *  Acute systolic CHF (congestive heart failure) (HCC) - IV Lasix given . Continue Lasix by mouth  Daily weight monitoring  Intake and output  Continue statin and held ACE inhibitor and Lasix as renal function is getting worse, Added betablocker.  restart lasix now, as have some crepitations and lasix was on hold.  * hypertension - blood pressure is better. Continue home medications Norvasc, metoprolol dose increased to 25 as we are holding enalapril and Lasix   *  Diabetes (HCC) - sliding scale insulin with corresponding glucose checks Levemir 15 units daily at bedtime and NovoLog 6 units 3 times a day    All the records are reviewed and case discussed with Care Management/Social Workerr. Management plans discussed with the patient, family  and they are in agreement.  CODE STATUS: fc   TOTAL TIME TAKING CARE OF THIS PATIENT: 35 minutes.   POSSIBLE D/C IN 2 DAYS, DEPENDING ON CLINICAL CONDITION.  Note: This dictation was prepared with Dragon dictation along with smaller phrase technology. Any transcriptional errors that result from this process are unintentional.   Vaughan Basta M.D on 08/18/2016 at 9:20 PM  Between 7am to 6pm - Pager - [phone] After 6pm go to [email or web address] - password [email or web address] [facility]  [facility]  Office  [phone]  CC: Primary care physician; [place] Lank, MD

## 2016-08-18 NOTE — Progress Notes (Signed)
[facility]  ROUNDING NOTE   Subjective:  Renal function stable at the moment. Creatinine currently 1.56. Patient working with physical therapy today.   Objective:  Vital signs in last 24 hours:  Temp:  [97.7 F (36.5 C)-98.5 F (36.9 C)] 98.5 F (36.9 C) (06/06 1203) Pulse Rate:  [73-86] 73 (06/06 1203) Resp:  [15-20] 18 (06/06 1203) BP: (132-149)/(63-79) 132/63 (06/06 1203) SpO2:  [89 %-96 %] 94 % (06/06 1203) FiO2 (%):  [32 %] 32 % (06/05 2004) Weight:  [89.8 kg (197 lb 14.4 oz)] 89.8 kg (197 lb 14.4 oz) (06/06 0500)  Weight change: -3.175 kg (-7 lb) Filed Weights   08/16/16 0409 08/17/16 0354 08/18/16 0500  Weight: 93.1 kg (205 lb 4.8 oz) 92.9 kg (204 lb 14.4 oz) 89.8 kg (197 lb 14.4 oz)    Intake/Output: I/O last 3 completed shifts: In: 600 [P.O.:600] Out: 8588 [Urine:1275]   Intake/Output this shift:  Total I/O In: 240 [P.O.:240] Out: -   Physical Exam: General: No acute distress  Head: Normocephalic, atraumatic. Moist oral mucosal membranes  Eyes: Anicteric  Neck: Supple, trachea midline  Lungs:  Mild wheezing, normal effort  Heart: S1S2 no rubs  Abdomen:  Soft, nontender, bowel sounds present, hernia present  Extremities: Trace peripheral edema.  Neurologic: Awake, alert, following commands  Skin: No lesions       Basic Metabolic Panel:  Recent Labs Lab 08/14/16 2028 08/15/16 0446 08/17/16 1236 08/18/16 0451  NA 142 137 140 141  K 3.7 4.0 4.5 3.9  CL 104 98* 100* 102  CO2 27 28 30  32  GLUCOSE 219* 464* 305* 188*  BUN 17 22* 42* 42*  CREATININE 1.62* 1.83* 1.56* 1.56*  CALCIUM 9.0 8.5* 8.9 8.4*    Liver Function Tests: No results for input(s): AST, ALT, ALKPHOS, BILITOT, PROT, ALBUMIN in the last 168 hours. No results for input(s): LIPASE, AMYLASE in the last 168 hours. No results for input(s): AMMONIA in the last 168 hours.  CBC:  Recent Labs Lab 08/14/16 2028 08/15/16 0446 08/17/16 1301 08/18/16 0451  WBC 13.9*  10.0 11.1* 9.9  NEUTROABS 10.5*  --   --   --   HGB 10.1* 9.4* 10.0* 10.0*  HCT 32.1* 30.0* 31.2* 31.3*  MCV 86.5 85.3 83.4 83.7  PLT 306 266 296 313    Cardiac Enzymes:  Recent Labs Lab 08/14/16 2028  TROPONINI <0.03    BNP: Invalid input(s): POCBNP  CBG:  Recent Labs Lab 08/17/16 1218 08/17/16 1659 08/17/16 2113 08/18/16 0734 08/18/16 1137  GLUCAP 308* 351* 179* 240* 374*    Microbiology: Results for orders placed or performed during the hospital encounter of 08/14/16  Culture, blood (routine x 2)     Status: Abnormal   Collection Time: 08/14/16  8:28 PM  Result Value Ref Range Status   Specimen Description BLOOD LEFT FOREARM  Final   Special Requests   Final    BOTTLES DRAWN AEROBIC AND ANAEROBIC Blood Culture adequate volume   Culture  Setup Time   Final    Organism ID to follow [place] AND ANAEROBIC BOTTLES CRITICAL RESULT CALLED TO, READ BACK BY AND VERIFIED WITH: JASON ROBBINS 08/15/16 AT 1925 BY HS    Culture (A)  Final    STAPHYLOCOCCUS SPECIES (COAGULASE NEGATIVE) THE SIGNIFICANCE OF ISOLATING THIS ORGANISM FROM A SINGLE SET OF BLOOD CULTURES WHEN MULTIPLE SETS ARE DRAWN IS UNCERTAIN. PLEASE NOTIFY THE MICROBIOLOGY DEPARTMENT WITHIN ONE WEEK IF SPECIATION AND SENSITIVITIES ARE  REQUIRED. Performed at [facility], [place] [address] [place], [place] [address]    Report Status 08/18/2016 FINAL  Final  Culture, blood (routine x 2)     Status: None (Preliminary result)   Collection Time: 08/14/16  8:28 PM  Result Value Ref Range Status   Specimen Description BLOOD LEFT ANTECUBITAL  Final   Special Requests   Final    BOTTLES DRAWN AEROBIC AND ANAEROBIC Blood Culture adequate volume   Culture NO GROWTH 4 DAYS  Final   Report Status PENDING  Incomplete  Blood Culture ID Panel (Reflexed)     Status: Abnormal   Collection Time: 08/14/16  8:28 PM  Result Value Ref Range Status   Enterococcus species NOT DETECTED NOT DETECTED  Final   Listeria monocytogenes NOT DETECTED NOT DETECTED Final   Staphylococcus species DETECTED (A) NOT DETECTED Final    Comment: Methicillin (oxacillin) susceptible coagulase negative staphylococcus. Possible blood culture contaminant (unless isolated from more than one blood culture draw or clinical case suggests pathogenicity). No antibiotic treatment is indicated for blood  culture contaminants. CRITICAL RESULT CALLED TO, READ BACK BY AND VERIFIED WITH: JASON ROBBINS 08/15/16 AT 1925 BY HS    Staphylococcus aureus NOT DETECTED NOT DETECTED Final   Methicillin resistance NOT DETECTED NOT DETECTED Final   Streptococcus species NOT DETECTED NOT DETECTED Final   Streptococcus agalactiae NOT DETECTED NOT DETECTED Final   Streptococcus pneumoniae NOT DETECTED NOT DETECTED Final   Streptococcus pyogenes NOT DETECTED NOT DETECTED Final   Acinetobacter baumannii NOT DETECTED NOT DETECTED Final   Enterobacteriaceae species NOT DETECTED NOT DETECTED Final   Enterobacter cloacae complex NOT DETECTED NOT DETECTED Final   Escherichia coli NOT DETECTED NOT DETECTED Final   Klebsiella oxytoca NOT DETECTED NOT DETECTED Final   Klebsiella pneumoniae NOT DETECTED NOT DETECTED Final   Proteus species NOT DETECTED NOT DETECTED Final   Serratia marcescens NOT DETECTED NOT DETECTED Final   Haemophilus influenzae NOT DETECTED NOT DETECTED Final   Neisseria meningitidis NOT DETECTED NOT DETECTED Final   Pseudomonas aeruginosa NOT DETECTED NOT DETECTED Final   Candida albicans NOT DETECTED NOT DETECTED Final   Candida glabrata NOT DETECTED NOT DETECTED Final   Candida krusei NOT DETECTED NOT DETECTED Final   Candida parapsilosis NOT DETECTED NOT DETECTED Final   Candida tropicalis NOT DETECTED NOT DETECTED Final    Coagulation Studies: No results for input(s): LABPROT, INR in the last 72 hours.  Urinalysis: No results for input(s): COLORURINE, LABSPEC, PHURINE, GLUCOSEU, HGBUR, BILIRUBINUR, KETONESUR,  PROTEINUR, UROBILINOGEN, NITRITE, LEUKOCYTESUR in the last 72 hours.  Invalid input(s): APPERANCEUR    Imaging: US Renal  Result Date: 08/17/2016 CLINICAL DATA:  Acute renal failure EXAM: RENAL / URINARY TRACT ULTRASOUND COMPLETE COMPARISON:  MRI 08/13/2013 FINDINGS: Right Kidney: Length: 14.9 cm. Polycystic disease, with the largest cyst measuring 4.6 cm. No hydronephrosis. Left Kidney: Length: 15.7 cm. Polycystic disease, with the largest cyst measuring up to 5.6 cm. No hydronephrosis. Bladder: Appears normal for degree of bladder distention. Incidentally noted is a left adrenal mass measuring 5.0 x 4.6 x 3.9 cm. 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 IMPRESSION: Polycystic kidney disease with enlarged polycystic kidneys bilaterally. No hydronephrosis. 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 Electronically Signed   By: Rolm Baptise M.D.   On: 08/17/2016 12:16   US Abdomen Limited  Result Date: 08/17/2016 CLINICAL DATA:  History of umbilical hernia repair 2-3 years ago. Evaluate for abdominal wall  hernia. EXAM: ULTRASOUND ABDOMEN LIMITED COMPARISON:  None. FINDINGS: Examination demonstrates evidence of a midline abdominal wall hernia just above the umbilicus containing a moderate collection of peritoneal fat measuring approximately 2.1 x 4.8 x 4.9 cm. No evidence of bowel loops within the hernia sac. IMPRESSION: Findings compatible with a midline ventral hernia just above the umbilicus containing peritoneal fat. Electronically Signed   By: Marin Olp M.D.   On: 08/17/2016 18:13     Medications:   . cefTRIAXone (ROCEPHIN) IVPB 1 gram/50 mL D5W 1 g (08/18/16 0830)   . amLODipine  10 mg Oral Daily  . atorvastatin  40 mg Oral Daily  . azithromycin  500 mg Oral Daily  . FLUoxetine  40 mg Oral Daily  . furosemide  20 mg Oral Daily  . heparin  5,000 Units Subcutaneous Q8H  . insulin aspart  0-15 Units Subcutaneous TID WC  . insulin aspart  0-5 Units Subcutaneous  QHS  . insulin aspart  6 Units Subcutaneous TID WC  . insulin detemir  15 Units Subcutaneous Daily  . ipratropium-albuterol  3 mL Nebulization QID  . methylPREDNISolone (SOLU-MEDROL) injection  40 mg Intravenous Q12H  . metoprolol tartrate  25 mg Oral BID  . pantoprazole  40 mg Oral Daily  . traZODone  50 mg Oral QHS  . umeclidinium bromide  1 puff Inhalation Daily   acetaminophen **OR** acetaminophen, albuterol, benzonatate, guaiFENesin-dextromethorphan, labetalol, ondansetron **OR** ondansetron (ZOFRAN) IV, oxyCODONE-acetaminophen **AND** oxyCODONE  Assessment/ Plan:  76 y.o. female with a PMHx of Polycystic kidney disease, avascular necrosis of the hips, coronary artery disease, chronic systolic heart failure ejection fraction 40-45%, chronic kidney disease stage III, GERD, hypertension, hyperthyroidism, vitamin B12 deficiency, who was admitted to [facility] on 08/14/2016 for evaluation of shortness of breath and acute systolic heart failure exacerbation.  1.  Acute renal failure. 2.  CKD stage III due to polycystic kidney disease 3.  Anemia of CKD. 4.  Hypertension. 5.  Acute on chronic systolic heart failure.    Plan:  Renal function appears to be stable. Creatinine currently 1.56. Patient will need continued follow-up of her underlying chronic kidney disease secondary to polycystic kidney disease. In addition she could be a candidate for therapy with ADH antagonist for treatment of polycystic kidney disease. Hemoglobin also remained stable at 10.0. Hold off on Epogen at this time.   LOS: [place] 6/6/201812:07 PM

## 2016-08-19 LAB — BASIC METABOLIC PANEL
Anion gap: 7 (ref 5–15)
BUN: 36 mg/dL — ABNORMAL HIGH (ref 6–20)
CO2: 34 mmol/L — ABNORMAL HIGH (ref 22–32)
Calcium: 8.5 mg/dL — ABNORMAL LOW (ref 8.9–10.3)
Chloride: 101 mmol/L (ref 101–111)
Creatinine, Ser: 1.41 mg/dL — ABNORMAL HIGH (ref 0.44–1.00)
GFR calc Af Amer: 41 mL/min — ABNORMAL LOW (ref >60)
GFR calc non Af Amer: 36 mL/min — ABNORMAL LOW (ref >60)
Glucose, Bld: 171 mg/dL — ABNORMAL HIGH (ref 65–99)
Potassium: 3.9 mmol/L (ref 3.5–5.1)
Sodium: 142 mmol/L (ref 135–145)

## 2016-08-19 LAB — CULTURE, BLOOD (ROUTINE X 2)
Culture: NO GROWTH
Special Requests: ADEQUATE

## 2016-08-19 LAB — GLUCOSE, CAPILLARY
Glucose-Capillary: 268 mg/dL — ABNORMAL HIGH (ref 65–99)
Glucose-Capillary: 256 mg/dL — ABNORMAL HIGH (ref 65–99)

## 2016-08-19 MED ORDER — AZITHROMYCIN 250 MG PO TABS: 250.0000 mg | ORAL_TABLET | Freq: Every day | ORAL | 0 refills | Status: AC

## 2016-08-19 MED ORDER — PREDNISONE 10 MG (21) PO TBPK: ORAL_TABLET | ORAL | 0 refills | Status: DC

## 2016-08-19 MED ORDER — METOPROLOL TARTRATE 25 MG PO TABS: 25.0000 mg | ORAL_TABLET | Freq: Two times a day (BID) | ORAL | 0 refills | Status: DC

## 2016-08-19 MED ORDER — CEFUROXIME AXETIL 250 MG PO TABS: 250.0000 mg | ORAL_TABLET | Freq: Two times a day (BID) | ORAL | 0 refills | Status: AC

## 2016-08-19 NOTE — Progress Notes (Signed)
Inpatient Diabetes Program Recommendations  AACE/ADA: New Consensus Statement on Inpatient Glycemic Control (2015)  Target Ranges:  Prepandial:   less than 140 mg/dL      Peak postprandial:   less than 180 mg/dL (1-2 hours)      Critically ill patients:  140 - 180 mg/dL   Results for Mckenzie Taylor, Mckenzie Taylor (MRN [id number]) as of 08/19/2016 12:20  Ref. Range 08/18/2016 07:34 08/18/2016 11:37 08/18/2016 16:40 08/18/2016 20:56 08/19/2016 07:46 08/19/2016 12:03  Glucose-Capillary Latest Ref Range: 65 - 99 mg/dL 240 (H) 374 (H) 168 (H) 170 (H) 268 (H) 256 (H)   Review of Glycemic Control  Diabetes history: DM2 Outpatient Diabetes medications: Levemir 15 units QHS, Novolog 2-10 units TID with meals, Metformin 500 mg BID, Glipizide 2.5 mg daily Current orders for Inpatient glycemic control: Levemir 15 units daily, Novolog 6 units TID with meals, Novolog 0-15 units TID with meals, Novolog 0-5 units HS        * Solumedrol 40 mg Q12H  Inpatient Diabetes Program Recommendations: Insulin - Basal: Fasting glcuose 240 mg/dl on 08/18/16 and 268 mg/dl today. Please consider increasing Levemir to 20 units daily. Insulin - Meal Coverage: If steroids are continued as ordered, please consider increasing meal coverage to Novolog 10 units TID with meals if patient eats at least 50% of meals.  Thanks, Barnie Alderman, RN, MSN, CDE Diabetes Coordinator Inpatient Diabetes Program [phone] (Team Pager from 8am to 5pm)

## 2016-08-19 NOTE — Plan of Care (Signed)
Problem: Safety: Goal: Ability to remain free from injury will improve Outcome: Completed/Met Date Met: 08/19/16 Pt remained injury free while in hospital. I will continue to assess.

## 2016-08-19 NOTE — Progress Notes (Signed)
[facility]  ROUNDING NOTE   Subjective:  Renal function continues to improve. Creatinine down to 1.41. Patient did ambulate yesterday but had a difficult time with this.   Objective:  Vital signs in last 24 hours:  Temp:  [98.2 F (36.8 C)-98.5 F (36.9 C)] 98.2 F (36.8 C) (06/07 0407) Pulse Rate:  [73-85] 80 (06/07 0407) Resp:  [18] 18 (06/07 0407) BP: (132-164)/(63-82) 164/82 (06/07 0407) SpO2:  [94 %-96 %] 94 % (06/07 1114) Weight:  [94.4 kg (208 lb 1.6 oz)] 94.4 kg (208 lb 1.6 oz) (06/07 0407)  Weight change: 4.627 kg (10 lb 3.2 oz) Filed Weights   08/17/16 0354 08/18/16 0500 08/19/16 0407  Weight: 92.9 kg (204 lb 14.4 oz) 89.8 kg (197 lb 14.4 oz) 94.4 kg (208 lb 1.6 oz)    Intake/Output: I/O last 3 completed shifts: In: 480 [P.O.:480] Out: 2000 [Urine:2000]   Intake/Output this shift:  Total I/O In: 240 [P.O.:240] Out: 200 [Urine:200]  Physical Exam: General: No acute distress  Head: Normocephalic, atraumatic. Moist oral mucosal membranes  Eyes: Anicteric  Neck: Supple, trachea midline  Lungs:  Mild wheezing, normal effort  Heart: S1S2 no rubs  Abdomen:  Soft, nontender, bowel sounds present, hernia present  Extremities: Trace peripheral edema.  Neurologic: Awake, alert, following commands  Skin: No lesions       Basic Metabolic Panel:  Recent Labs Lab 08/14/16 2028 08/15/16 0446 08/17/16 1236 08/18/16 0451 08/19/16 0607  NA 142 137 140 141 142  K 3.7 4.0 4.5 3.9 3.9  CL 104 98* 100* 102 101  CO2 27 28 30  32 34*  GLUCOSE 219* 464* 305* 188* 171*  BUN 17 22* 42* 42* 36*  CREATININE 1.62* 1.83* 1.56* 1.56* 1.41*  CALCIUM 9.0 8.5* 8.9 8.4* 8.5*    Liver Function Tests: No results for input(s): AST, ALT, ALKPHOS, BILITOT, PROT, ALBUMIN in the last 168 hours. No results for input(s): LIPASE, AMYLASE in the last 168 hours. No results for input(s): AMMONIA in the last 168 hours.  CBC:  Recent Labs Lab 08/14/16 2028  08/15/16 0446 08/17/16 1301 08/18/16 0451  WBC 13.9* 10.0 11.1* 9.9  NEUTROABS 10.5*  --   --   --   HGB 10.1* 9.4* 10.0* 10.0*  HCT 32.1* 30.0* 31.2* 31.3*  MCV 86.5 85.3 83.4 83.7  PLT 306 266 296 313    Cardiac Enzymes:  Recent Labs Lab 08/14/16 2028  TROPONINI <0.03    BNP: Invalid input(s): POCBNP  CBG:  Recent Labs Lab 08/18/16 0734 08/18/16 1137 08/18/16 1640 08/18/16 2056 08/19/16 0746  GLUCAP 240* 374* 168* 170* 268*    Microbiology: Results for orders placed or performed during the hospital encounter of 08/14/16  Culture, blood (routine x 2)     Status: Abnormal   Collection Time: 08/14/16  8:28 PM  Result Value Ref Range Status   Specimen Description BLOOD LEFT FOREARM  Final   Special Requests   Final    BOTTLES DRAWN AEROBIC AND ANAEROBIC Blood Culture adequate volume   Culture  Setup Time   Final    Organism ID to follow [place] AND ANAEROBIC BOTTLES CRITICAL RESULT CALLED TO, READ BACK BY AND VERIFIED WITH: JASON ROBBINS 08/15/16 AT 1925 BY HS    Culture (A)  Final    STAPHYLOCOCCUS SPECIES (COAGULASE NEGATIVE) THE SIGNIFICANCE OF ISOLATING THIS ORGANISM FROM A SINGLE SET OF BLOOD CULTURES WHEN MULTIPLE SETS ARE DRAWN IS UNCERTAIN. PLEASE NOTIFY [place]  WITHIN ONE WEEK IF SPECIATION AND SENSITIVITIES ARE REQUIRED. Performed at [facility], [place] [address] [place], [place] [address]    Report Status 08/18/2016 FINAL  Final  Culture, blood (routine x 2)     Status: None   Collection Time: 08/14/16  8:28 PM  Result Value Ref Range Status   Specimen Description BLOOD LEFT ANTECUBITAL  Final   Special Requests   Final    BOTTLES DRAWN AEROBIC AND ANAEROBIC Blood Culture adequate volume   Culture NO GROWTH 5 DAYS  Final   Report Status 08/19/2016 FINAL  Final  Blood Culture ID Panel (Reflexed)     Status: Abnormal   Collection Time: 08/14/16  8:28 PM  Result Value Ref Range Status    Enterococcus species NOT DETECTED NOT DETECTED Final   Listeria monocytogenes NOT DETECTED NOT DETECTED Final   Staphylococcus species DETECTED (A) NOT DETECTED Final    Comment: Methicillin (oxacillin) susceptible coagulase negative staphylococcus. Possible blood culture contaminant (unless isolated from more than one blood culture draw or clinical case suggests pathogenicity). No antibiotic treatment is indicated for blood  culture contaminants. CRITICAL RESULT CALLED TO, READ BACK BY AND VERIFIED WITH: JASON ROBBINS 08/15/16 AT 1925 BY HS    Staphylococcus aureus NOT DETECTED NOT DETECTED Final   Methicillin resistance NOT DETECTED NOT DETECTED Final   Streptococcus species NOT DETECTED NOT DETECTED Final   Streptococcus agalactiae NOT DETECTED NOT DETECTED Final   Streptococcus pneumoniae NOT DETECTED NOT DETECTED Final   Streptococcus pyogenes NOT DETECTED NOT DETECTED Final   Acinetobacter baumannii NOT DETECTED NOT DETECTED Final   Enterobacteriaceae species NOT DETECTED NOT DETECTED Final   Enterobacter cloacae complex NOT DETECTED NOT DETECTED Final   Escherichia coli NOT DETECTED NOT DETECTED Final   Klebsiella oxytoca NOT DETECTED NOT DETECTED Final   Klebsiella pneumoniae NOT DETECTED NOT DETECTED Final   Proteus species NOT DETECTED NOT DETECTED Final   Serratia marcescens NOT DETECTED NOT DETECTED Final   Haemophilus influenzae NOT DETECTED NOT DETECTED Final   Neisseria meningitidis NOT DETECTED NOT DETECTED Final   Pseudomonas aeruginosa NOT DETECTED NOT DETECTED Final   Candida albicans NOT DETECTED NOT DETECTED Final   Candida glabrata NOT DETECTED NOT DETECTED Final   Candida krusei NOT DETECTED NOT DETECTED Final   Candida parapsilosis NOT DETECTED NOT DETECTED Final   Candida tropicalis NOT DETECTED NOT DETECTED Final    Coagulation Studies: No results for input(s): LABPROT, INR in the last 72 hours.  Urinalysis: No results for input(s): COLORURINE, LABSPEC,  PHURINE, GLUCOSEU, HGBUR, BILIRUBINUR, KETONESUR, PROTEINUR, UROBILINOGEN, NITRITE, LEUKOCYTESUR in the last 72 hours.  Invalid input(s): APPERANCEUR    Imaging: US Renal  Result Date: 08/17/2016 CLINICAL DATA:  Acute renal failure EXAM: RENAL / URINARY TRACT ULTRASOUND COMPLETE COMPARISON:  MRI 08/13/2013 FINDINGS: Right Kidney: Length: 14.9 cm. Polycystic disease, with the largest cyst measuring 4.6 cm. No hydronephrosis. Left Kidney: Length: 15.7 cm. Polycystic disease, with the largest cyst measuring up to 5.6 cm. No hydronephrosis. Bladder: Appears normal for degree of bladder distention. Incidentally noted is a left adrenal mass measuring 5.0 x 4.6 x 3.9 cm. 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 IMPRESSION: Polycystic kidney disease with enlarged polycystic kidneys bilaterally. No hydronephrosis. 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 Electronically Signed   By: Rolm Baptise M.D.   On: 08/17/2016 12:16   US Abdomen Limited  Result Date: 08/17/2016 CLINICAL DATA:  History of umbilical hernia repair  2-3 years ago. Evaluate for abdominal wall hernia. EXAM: ULTRASOUND ABDOMEN LIMITED COMPARISON:  None. FINDINGS: Examination demonstrates evidence of a midline abdominal wall hernia just above the umbilicus containing a moderate collection of peritoneal fat measuring approximately 2.1 x 4.8 x 4.9 cm. No evidence of bowel loops within the hernia sac. IMPRESSION: Findings compatible with a midline ventral hernia just above the umbilicus containing peritoneal fat. Electronically Signed   By: Marin Olp M.D.   On: 08/17/2016 18:13     Medications:   . cefTRIAXone (ROCEPHIN) IVPB 1 gram/50 mL D5W 1 g (08/19/16 0825)   . amLODipine  10 mg Oral Daily  . atorvastatin  40 mg Oral Daily  . azithromycin  500 mg Oral Daily  . FLUoxetine  40 mg Oral Daily  . furosemide  20 mg Oral Daily  . heparin  5,000 Units Subcutaneous Q8H  . insulin aspart  0-15 Units Subcutaneous  TID WC  . insulin aspart  0-5 Units Subcutaneous QHS  . insulin aspart  6 Units Subcutaneous TID WC  . insulin detemir  15 Units Subcutaneous Daily  . ipratropium-albuterol  3 mL Nebulization QID  . methylPREDNISolone (SOLU-MEDROL) injection  40 mg Intravenous Q12H  . metoprolol tartrate  25 mg Oral BID  . pantoprazole  40 mg Oral Daily  . traZODone  50 mg Oral QHS  . umeclidinium bromide  1 puff Inhalation Daily   acetaminophen **OR** acetaminophen, albuterol, benzonatate, guaiFENesin-dextromethorphan, labetalol, ondansetron **OR** ondansetron (ZOFRAN) IV, oxyCODONE-acetaminophen **AND** oxyCODONE  Assessment/ Plan:  76 y.o. female with a PMHx of Polycystic kidney disease, avascular necrosis of the hips, coronary artery disease, chronic systolic heart failure ejection fraction 40-45%, chronic kidney disease stage III, GERD, hypertension, hyperthyroidism, vitamin B12 deficiency, who was admitted to [facility] on 08/14/2016 for evaluation of shortness of breath and acute systolic heart failure exacerbation.  1.  Acute renal failure. 2.  CKD stage III due to polycystic kidney disease 3.  Anemia of CKD. 4.  Hypertension. 5.  Acute on chronic systolic heart failure.    Plan:  Patient seen at bedside. Creatinine continues to trend down and is currently down to 1.41. She will need follow-up of her underlying chronic kidney disease and polycystic kidney disease in our office.  Continue blood pressure control with amlodipine and metoprolol. Continue Lasix for her underlying heart failure as well.   LOS: 5 [place] Droll 6/7/201811:45 AM

## 2016-08-20 NOTE — Care Management (Signed)
CM had been following for potential need for home health at discharge. Physical therapy consult was pending.   CM not aware patient discharged 08/19/2016 until review of census 08/20/2016. Reaching out to patient to assess need for home health.  There is no answer to phone number [phone] when called numerous times throughout the day.  The phone number [phone] for son [name] is no longer in service.  Unable to leave a voicemail for [phone]. Updated attending and primary nurse

## 2016-08-20 NOTE — Care Management Important Message (Signed)
Important Message  Patient Details  Name: TRISHELLE DEVORA MRN: [id number] Date of Birth: 1940-05-28   Medicare Important Message Given:  No Patient received IM on admission.  CM was not informed of discharge and patient did not receive notice.   Katrina Stack, RN 08/20/2016, 9:44 AM

## 2016-08-21 NOTE — Discharge Summary (Signed)
[place] [facility] [place] NAME: Mckenzie Taylor    MR#:  [id number]  DATE OF BIRTH:  [birth year]  DATE OF ADMISSION:  08/14/2016 ADMITTING PHYSICIAN: Lance Coon, MD  DATE OF DISCHARGE: 08/19/2016  1:57 PM  PRIMARY CARE PHYSICIAN: Denton Lank, MD    ADMISSION DIAGNOSIS:  Respiratory distress [R06.03] Acute on chronic congestive heart failure, unspecified heart failure type ([place]) [I50.9]  DISCHARGE DIAGNOSIS:  Principal Problem:   CAP (community acquired pneumonia) Active Problems:   COPD with acute exacerbation ([place])   Acute systolic CHF (congestive heart failure) ([place])   Accelerated hypertension   GERD (gastroesophageal reflux disease)   Diabetes ([place])   SECONDARY DIAGNOSIS:   Past Medical History:  Diagnosis Date  . Autosomal recessive polycystic kidneys   . Avascular necrosis of hip (HCC)    bilateral  . CAD (coronary artery disease)    mild  . CHF (congestive heart failure) ([place])   . Chronic kidney disease    cyst  . COPD (chronic obstructive pulmonary disease) ([place])   . Diabetes type 2, controlled ([place])   . GERD (gastroesophageal reflux disease)   . HTN (hypertension)   . Hypothyroidism    subclinical. low TSH, normal thyroid panel. biopsy 2010  . Vitamin B12 deficiency     HOSPITAL COURSE:   * AKI  Renal function is better. And 1.8-1.5 Patient was on Lasix for acute CHF exacerbation  held lasix Avoid other nephrotoxins and Follow up with nephrology Hold ACE inhibitor   Better.  * CAP (community acquired pneumonia) - IV antibiotics Rocephin and azithromycin, when necessary duo nebs duo nebs, when necessary antitussive Supportive care  *COPD with acute exacerbation ([place]) - IV Solu-Medrol, nebulizer rx , IV Rocephin and azithromycin  * Periumbilical abdominal pain-probably from irreducible abdominal wall hernia Got abdominal ultrasound- peritoneal fat is herniated, no intestine consult Dr. Tamala Julian,  patient was seen by Dr. Tamala Julian in the past- no need for surgery.  *Acute systolic CHF (congestive heart failure) (HCC) - IV Lasix given . Continue Lasix by mouth  Daily weight monitoring  Intake and output  Continue statin and held ACE inhibitor and Lasix as renal function is getting worse, Added betablocker.  restart lasix now, as have some crepitations and lasix was on hold.  *hypertension - blood pressure is better. Continue home medications Norvasc, metoprolol dose increased to 25 as we are holding enalapril and Lasix   *Diabetes (HCC) - sliding scale insulin with corresponding glucose checks Levemir 15 units daily at bedtime and NovoLog 6 units 3 times a day   DISCHARGE CONDITIONS:   Stable.  CONSULTS OBTAINED:  Treatment Team:  Corey Skains, MD Anthonette Legato, MD  DRUG ALLERGIES:  No Known Allergies  DISCHARGE MEDICATIONS:   Discharge Medication List as of 08/19/2016  1:07 PM    START taking these medications   Details  azithromycin (ZITHROMAX) 250 MG tablet Take 1 tablet (250 mg total) by mouth daily., Starting Thu 08/19/2016, Until Sun 08/22/2016, Print    cefUROXime (CEFTIN) 250 MG tablet Take 1 tablet (250 mg total) by mouth 2 (two) times daily., Starting Thu 08/19/2016, Until Sun 08/22/2016, Print    metoprolol tartrate (LOPRESSOR) 25 MG tablet Take 1 tablet (25 mg total) by mouth 2 (two) times daily., Starting Thu 08/19/2016, Print    predniSONE (STERAPRED UNI-PAK 21 TAB) 10 MG (21) TBPK tablet Take 6 tabs first day, 5 tab on day 2, then 4 on day 3rd, 3 tabs  on day 4th , 2 tab on day 5th, and 1 tab on 6th day., Print      CONTINUE these medications which have NOT CHANGED   Details  albuterol (PROVENTIL) (2.5 MG/3ML) 0.083% nebulizer solution Take by nebulization every 4 (four) hours as needed for wheezing or shortness of breath., Historical Med    albuterol (VENTOLIN HFA) 108 (90 BASE) MCG/ACT inhaler Inhale 2 puffs into the lungs every 6 (six) hours as  needed.  , Historical Med    amLODipine (NORVASC) 10 MG tablet Take 10 mg by mouth daily.  , Historical Med    atorvastatin (LIPITOR) 40 MG tablet Take 40 mg by mouth daily., Historical Med    FLUoxetine (PROZAC) 40 MG capsule Take 40 mg by mouth., Historical Med    furosemide (LASIX) 40 MG tablet Take 40 mg by mouth daily., Starting Tue 04/06/2016, Historical Med    gabapentin (NEURONTIN) 100 MG capsule Take 200 mg by mouth 3 (three) times daily., Historical Med    glipiZIDE (GLUCOTROL XL) 2.5 MG 24 hr tablet Take 2.5 mg by mouth daily. , Historical Med    ibuprofen (ADVIL,MOTRIN) 800 MG tablet Take 800 mg by mouth every 8 (eight) hours as needed for mild pain. , Historical Med    ipratropium-albuterol (DUONEB) 0.5-2.5 (3) MG/3ML SOLN Take 3 mLs by nebulization 4 (four) times daily., Starting Sun 03/28/2016, Print    LEVEMIR FLEXTOUCH 100 UNIT/ML Pen Inject 15 Units into the skin at bedtime., Starting Fri 07/23/2016, Historical Med    NOVOLOG FLEXPEN 100 UNIT/ML FlexPen Inject 2-10 Units into the skin 3 (three) times daily as needed. Sliding scale if blood sugar is above 200., Starting Fri 04/02/2016, Historical Med    omeprazole (PRILOSEC) 20 MG capsule Take 20 mg by mouth daily., Historical Med    oxyCODONE-acetaminophen (PERCOCET) 10-325 MG tablet Take 1 tablet by mouth every 8 (eight) hours as needed for pain. , Starting Wed 04/21/2016, Historical Med    tiZANidine (ZANAFLEX) 4 MG capsule Take 4 mg by mouth 3 (three) times daily as needed for muscle spasms., Historical Med    traZODone (DESYREL) 50 MG tablet Take 50 mg by mouth at bedtime., Starting Tue 08/10/2016, Historical Med    umeclidinium bromide (INCRUSE ELLIPTA) 62.5 MCG/INH AEPB Inhale 1 puff into the lungs daily., Historical Med      STOP taking these medications     enalapril (VASOTEC) 20 MG tablet      metFORMIN (GLUCOPHAGE) 500 MG tablet          DISCHARGE INSTRUCTIONS:   Follow with PMD in 1-2 weeks.  If  you experience worsening of your admission symptoms, develop shortness of breath, life threatening emergency, suicidal or homicidal thoughts you must seek medical attention immediately by calling 911 or calling your MD immediately  if symptoms less severe.  You Must read complete instructions/literature along with all the possible adverse reactions/side effects for all the Medicines you take and that have been prescribed to you. Take any new Medicines after you have completely understood and accept all the possible adverse reactions/side effects.   Please note  You were cared for by a hospitalist during your hospital stay. If you have any questions about your discharge medications or the care you received while you were in the hospital after you are discharged, you can call the unit and asked to speak with the hospitalist on call if the hospitalist that took care of you is not available. Once you are discharged, your primary  care physician will handle any further medical issues. Please note that NO REFILLS for any discharge medications will be authorized once you are discharged, as it is imperative that you return to your primary care physician (or establish a relationship with a primary care physician if you do not have one) for your aftercare needs so that they can reassess your need for medications and monitor your lab values.    Today   CHIEF COMPLAINT:   Chief Complaint  Patient presents with  . Shortness of Breath    HISTORY OF PRESENT ILLNESS:  Mckenzie Taylor  is a 76 y.o. female presents with 3-4 days progressive shortness of breath, increased cough with sputum production. Here in the ED tonight she was somewhat hypoxic and working hard to breathe, she required BiPAP for some period of time. But was able to be weaned.  Initial workup was consistent with mild heart failure exacerbation, and imaging which showed likely pneumonia. Hospitalists were called for admission    VITAL SIGNS:   Blood pressure (!) 164/82, pulse 80, temperature 98.2 F (36.8 C), temperature source Oral, resp. rate 18, height 5\' 6"  (1.676 m), weight 94.4 kg (208 lb 1.6 oz), SpO2 94 %.  I/O:  No intake or output data in the 24 hours ending 08/21/16 1436  PHYSICAL EXAMINATION:  GENERAL:  76 y.o.-year-old patient lying in the bed with no acute distress.  EYES: Pupils equal, round, reactive to light and accommodation. No scleral icterus. Extraocular muscles intact.  HEENT: Head atraumatic, normocephalic. Oropharynx and nasopharynx clear.  NECK:  Supple, no jugular venous distention. No thyroid enlargement, no tenderness.  LUNGS: Normal breath sounds bilaterally, no wheezing, rales,rhonchi or crepitation. No use of accessory muscles of respiration.  CARDIOVASCULAR: S1, S2 normal. No murmurs, rubs, or gallops.  ABDOMEN: Soft, non-tender, non-distended. Bowel sounds present. No organomegaly or mass.  EXTREMITIES: No pedal edema, cyanosis, or clubbing.  NEUROLOGIC: Cranial nerves II through XII are intact. Muscle strength 5/5 in all extremities. Sensation intact. Gait not checked.  PSYCHIATRIC: The patient is alert and oriented x 3.  SKIN: No obvious rash, lesion, or ulcer.   DATA REVIEW:   CBC  Recent Labs Lab 08/18/16 0451  WBC 9.9  HGB 10.0*  HCT 31.3*  PLT 313    Chemistries   Recent Labs Lab 08/19/16 0607  NA 142  K 3.9  CL 101  CO2 34*  GLUCOSE 171*  BUN 36*  CREATININE 1.41*  CALCIUM 8.5*    Cardiac Enzymes  Recent Labs Lab 08/14/16 2028  TROPONINI <0.03    Microbiology Results  Results for orders placed or performed during the hospital encounter of 08/14/16  Culture, blood (routine x 2)     Status: Abnormal   Collection Time: 08/14/16  8:28 PM  Result Value Ref Range Status   Specimen Description BLOOD LEFT FOREARM  Final   Special Requests   Final    BOTTLES DRAWN AEROBIC AND ANAEROBIC Blood Culture adequate volume   Culture  Setup Time   Final    Organism ID  to follow GRAM POSITIVE COCCI IN BOTH AEROBIC AND ANAEROBIC BOTTLES CRITICAL RESULT CALLED TO, READ BACK BY AND VERIFIED WITH: JASON ROBBINS 08/15/16 AT 1925 BY HS    Culture (A)  Final    STAPHYLOCOCCUS SPECIES (COAGULASE NEGATIVE) THE SIGNIFICANCE OF ISOLATING THIS ORGANISM FROM A SINGLE SET OF BLOOD CULTURES WHEN MULTIPLE SETS ARE DRAWN IS UNCERTAIN. PLEASE NOTIFY THE MICROBIOLOGY DEPARTMENT WITHIN ONE WEEK IF SPECIATION AND SENSITIVITIES ARE REQUIRED.  Performed at [facility], [place] [address], [place], [place] [address]    Report Status 08/18/2016 FINAL  Final  Culture, blood (routine x 2)     Status: None   Collection Time: 08/14/16  8:28 PM  Result Value Ref Range Status   Specimen Description BLOOD LEFT ANTECUBITAL  Final   Special Requests   Final    BOTTLES DRAWN AEROBIC AND ANAEROBIC Blood Culture adequate volume   Culture NO GROWTH 5 DAYS  Final   Report Status 08/19/2016 FINAL  Final  Blood Culture ID Panel (Reflexed)     Status: Abnormal   Collection Time: 08/14/16  8:28 PM  Result Value Ref Range Status   Enterococcus species NOT DETECTED NOT DETECTED Final   Listeria monocytogenes NOT DETECTED NOT DETECTED Final   Staphylococcus species DETECTED (A) NOT DETECTED Final    Comment: Methicillin (oxacillin) susceptible coagulase negative staphylococcus. Possible blood culture contaminant (unless isolated from more than one blood culture draw or clinical case suggests pathogenicity). No antibiotic treatment is indicated for blood  culture contaminants. CRITICAL RESULT CALLED TO, READ BACK BY AND VERIFIED WITH: JASON ROBBINS 08/15/16 AT 1925 BY HS    Staphylococcus aureus NOT DETECTED NOT DETECTED Final   Methicillin resistance NOT DETECTED NOT DETECTED Final   Streptococcus species NOT DETECTED NOT DETECTED Final   Streptococcus agalactiae NOT DETECTED NOT DETECTED Final   Streptococcus pneumoniae NOT DETECTED NOT DETECTED Final   Streptococcus pyogenes NOT DETECTED NOT  DETECTED Final   Acinetobacter baumannii NOT DETECTED NOT DETECTED Final   Enterobacteriaceae species NOT DETECTED NOT DETECTED Final   Enterobacter cloacae complex NOT DETECTED NOT DETECTED Final   Escherichia coli NOT DETECTED NOT DETECTED Final   Klebsiella oxytoca NOT DETECTED NOT DETECTED Final   Klebsiella pneumoniae NOT DETECTED NOT DETECTED Final   Proteus species NOT DETECTED NOT DETECTED Final   Serratia marcescens NOT DETECTED NOT DETECTED Final   Haemophilus influenzae NOT DETECTED NOT DETECTED Final   Neisseria meningitidis NOT DETECTED NOT DETECTED Final   Pseudomonas aeruginosa NOT DETECTED NOT DETECTED Final   Candida albicans NOT DETECTED NOT DETECTED Final   Candida glabrata NOT DETECTED NOT DETECTED Final   Candida krusei NOT DETECTED NOT DETECTED Final   Candida parapsilosis NOT DETECTED NOT DETECTED Final   Candida tropicalis NOT DETECTED NOT DETECTED Final    RADIOLOGY:  No results found.  EKG:   Orders placed or performed during the hospital encounter of 08/14/16  . ED EKG  . ED EKG  . EKG 12-Lead  . EKG 12-Lead      Management plans discussed with the patient, family and they are in agreement.  CODE STATUS:  Code Status History    Date Active Date Inactive Code Status Order ID Comments User Context   08/14/2016 11:32 PM 08/19/2016  4:57 PM Full Code [id number]  Lance Coon, MD Inpatient   05/05/2016  7:47 AM 05/07/2016  6:04 PM Full Code [id number]  Flora Lipps, MD ED   03/24/2016  2:16 PM 03/28/2016  2:24 PM Full Code [id number]  Awilda Bill, NP ED    Advance Directive Documentation     Most Recent Value  Type of Advance Directive  Living will  Pre-existing out of facility DNR order (yellow form or pink MOST form)  -  "MOST" Form in Place?  -      TOTAL TIME TAKING CARE OF THIS PATIENT: 35 minutes.    Vaughan Basta M.D on 08/21/2016 at 2:36 PM  Between 7am to 6pm - Pager - [phone]  After 6pm go to [email or web address] - password [email or web address]  [facility]  Office  [phone]  CC: Primary care physician; Denton Lank, MD   Note: This dictation was prepared with Dragon dictation along with smaller phrase technology. Any transcriptional errors that result from this process are unintentional.

## 2016-08-25 ENCOUNTER — Ambulatory Visit: Payer: Medicare Other | Admitting: Family

## 2016-09-10 ENCOUNTER — Ambulatory Visit: Payer: Medicare Other | Admitting: Family

## 2016-09-13 ENCOUNTER — Ambulatory Visit: Payer: Medicare Other | Admitting: Family

## 2016-09-13 ENCOUNTER — Telehealth: Payer: Self-pay | Admitting: Family

## 2016-09-13 NOTE — Telephone Encounter (Signed)
Patient did not show for her [facility] appointment on 09/13/16. Will attempt to reschedule.

## 2016-09-24 NOTE — Progress Notes (Deleted)
Cardiology Office Note  Date:  09/24/2016   ID:  Mckenzie Taylor, DOB [birth year], MRN [id number]  PCP:  Denton Lank, MD   No chief complaint on file.   HPI:   Hospital admission 08/19/2016 for  Pneumonia respiratory distress,  COPD exacerbation Diabetes Acute systolic CHF Accelerated hypertension Acute renal failure  Echocardiogram 08/15/2016 Left ventricle: The cavity size was normal. Systolic function was   mildly reduced. The estimated ejection fraction was in the range   of 45% to 50%.    PMH:   has a past medical history of Autosomal recessive polycystic kidneys; Avascular necrosis of hip (HCC); CAD (coronary artery disease); CHF (congestive heart failure) ([place]); Chronic kidney disease; COPD (chronic obstructive pulmonary disease) ([place]); Diabetes type 2, controlled ([place]); GERD (gastroesophageal reflux disease); HTN (hypertension); Hypothyroidism; and Vitamin B12 deficiency.  PSH:    Past Surgical History:  Procedure Laterality Date  . CHOLECYSTECTOMY    . hip replacement     bilateral-secondary to avascular necrosis  . right eye lens replacement    . ULNAR NERVE REPAIR     bilateral  . VESICOVAGINAL FISTULA CLOSURE W/ TAH      Current Outpatient Prescriptions  Medication Sig Dispense Refill  . albuterol (PROVENTIL) (2.5 MG/3ML) 0.083% nebulizer solution Take by nebulization every 4 (four) hours as needed for wheezing or shortness of breath.    Marland Kitchen albuterol (VENTOLIN HFA) 108 (90 BASE) MCG/ACT inhaler Inhale 2 puffs into the lungs every 6 (six) hours as needed.      Marland Kitchen amLODipine (NORVASC) 10 MG tablet Take 10 mg by mouth daily.      Marland Kitchen atorvastatin (LIPITOR) 40 MG tablet Take 40 mg by mouth daily.    Marland Kitchen FLUoxetine (PROZAC) 40 MG capsule Take 40 mg by mouth.    . furosemide (LASIX) 40 MG tablet Take 40 mg by mouth daily.    Marland Kitchen gabapentin (NEURONTIN) 100 MG capsule Take 200 mg by mouth 3 (three) times daily.    Marland Kitchen glipiZIDE (GLUCOTROL XL) 2.5 MG 24 hr tablet Take 2.5  mg by mouth daily.     Marland Kitchen ibuprofen (ADVIL,MOTRIN) 800 MG tablet Take 800 mg by mouth every 8 (eight) hours as needed for mild pain.     Marland Kitchen ipratropium-albuterol (DUONEB) 0.5-2.5 (3) MG/3ML SOLN Take 3 mLs by nebulization 4 (four) times daily. 360 mL 0  . LEVEMIR FLEXTOUCH 100 UNIT/ML Pen Inject 15 Units into the skin at bedtime.    . metoprolol tartrate (LOPRESSOR) 25 MG tablet Take 1 tablet (25 mg total) by mouth 2 (two) times daily. 60 tablet 0  . NOVOLOG FLEXPEN 100 UNIT/ML FlexPen Inject 2-10 Units into the skin 3 (three) times daily as needed. Sliding scale if blood sugar is above 200.    Marland Kitchen omeprazole (PRILOSEC) 20 MG capsule Take 20 mg by mouth daily.    Marland Kitchen oxyCODONE-acetaminophen (PERCOCET) 10-325 MG tablet Take 1 tablet by mouth every 8 (eight) hours as needed for pain.     . predniSONE (STERAPRED UNI-PAK 21 TAB) 10 MG (21) TBPK tablet Take 6 tabs first day, 5 tab on day 2, then 4 on day 3rd, 3 tabs on day 4th , 2 tab on day 5th, and 1 tab on 6th day. 21 tablet 0  . tiZANidine (ZANAFLEX) 4 MG capsule Take 4 mg by mouth 3 (three) times daily as needed for muscle spasms.    . traZODone (DESYREL) 50 MG tablet Take 50 mg by mouth at bedtime.    Marland Kitchen umeclidinium  bromide (INCRUSE ELLIPTA) 62.5 MCG/INH AEPB Inhale 1 puff into the lungs daily.     No current facility-administered medications for this visit.      Allergies:   Patient has no known allergies.   Social History:  The patient  reports that she has quit smoking. She smoked 2.00 packs per day. She has never used smokeless tobacco. She reports that she does not drink alcohol or use drugs.   Family History:   family history includes COPD in her mother; Cancer in her father; Heart disease in her maternal uncle and mother.    Review of Systems: ROS   PHYSICAL EXAM: VS:  There were no vitals taken for this visit. , BMI There is no height or weight on file to calculate BMI. GEN: Well nourished, well developed, in no acute  distress HEENT: normal Neck: no JVD, carotid bruits, or masses Cardiac: RRR; no murmurs, rubs, or gallops,no edema  Respiratory:  clear to auscultation bilaterally, normal work of breathing GI: soft, nontender, nondistended, + BS MS: no deformity or atrophy Skin: warm and dry, no rash Neuro:  Strength and sensation are intact Psych: euthymic mood, full affect    Recent Labs: 03/24/2016: TSH 1.459 03/25/2016: Magnesium 1.7 03/27/2016: ALT 11 08/14/2016: B Natriuretic Peptide 317.0 08/18/2016: Hemoglobin 10.0; Platelets 313 08/19/2016: BUN 36; Creatinine, Ser 1.41; Potassium 3.9; Sodium 142    Lipid Panel No results found for: CHOL, HDL, LDLCALC, TRIG    Wt Readings from Last 3 Encounters:  08/19/16 208 lb 1.6 oz (94.4 kg)  05/07/16 206 lb 4.8 oz (93.6 kg)  03/27/16 214 lb 1.1 oz (97.1 kg)       ASSESSMENT AND PLAN:  No diagnosis found.   Disposition:   F/U  6 months  No orders of the defined types were placed in this encounter.    Signed, Esmond Plants, M.D., Ph.D. 09/24/2016  [place], [place]

## 2016-09-28 ENCOUNTER — Ambulatory Visit: Payer: Medicare Other | Admitting: Cardiovascular Disease

## 2016-09-29 ENCOUNTER — Encounter: Payer: Self-pay | Admitting: Cardiovascular Disease

## 2016-10-25 ENCOUNTER — Inpatient Hospital Stay
Admission: EM | Admit: 2016-10-25 | Discharge: 2016-10-29 | DRG: 291 | Disposition: A | Discharge status: Home Health | Payer: Medicare Other | Attending: Internal Medicine | Admitting: Internal Medicine

## 2016-10-25 ENCOUNTER — Emergency Department: Payer: Medicare Other

## 2016-10-25 ENCOUNTER — Encounter: Payer: Self-pay | Admitting: Emergency Medicine

## 2016-10-25 DIAGNOSIS — Z794 Long term (current) use of insulin: Secondary | ICD-10-CM

## 2016-10-25 DIAGNOSIS — N183 Chronic kidney disease, stage 3 (moderate): Secondary | ICD-10-CM | POA: Diagnosis present

## 2016-10-25 DIAGNOSIS — E039 Hypothyroidism, unspecified: Secondary | ICD-10-CM | POA: Diagnosis present

## 2016-10-25 DIAGNOSIS — J441 Chronic obstructive pulmonary disease with (acute) exacerbation: Secondary | ICD-10-CM | POA: Diagnosis present

## 2016-10-25 DIAGNOSIS — D631 Anemia in chronic kidney disease: Secondary | ICD-10-CM | POA: Diagnosis present

## 2016-10-25 DIAGNOSIS — I130 Hypertensive heart and chronic kidney disease with heart failure and stage 1 through stage 4 chronic kidney disease, or unspecified chronic kidney disease: Principal | ICD-10-CM | POA: Diagnosis present

## 2016-10-25 DIAGNOSIS — J9622 Acute and chronic respiratory failure with hypercapnia: Secondary | ICD-10-CM | POA: Diagnosis present

## 2016-10-25 DIAGNOSIS — Z87891 Personal history of nicotine dependence: Secondary | ICD-10-CM

## 2016-10-25 DIAGNOSIS — F329 Major depressive disorder, single episode, unspecified: Secondary | ICD-10-CM | POA: Diagnosis present

## 2016-10-25 DIAGNOSIS — E872 Acidosis: Secondary | ICD-10-CM | POA: Diagnosis present

## 2016-10-25 DIAGNOSIS — I429 Cardiomyopathy, unspecified: Secondary | ICD-10-CM | POA: Diagnosis present

## 2016-10-25 DIAGNOSIS — I509 Heart failure, unspecified: Secondary | ICD-10-CM

## 2016-10-25 DIAGNOSIS — E1165 Type 2 diabetes mellitus with hyperglycemia: Secondary | ICD-10-CM | POA: Diagnosis present

## 2016-10-25 DIAGNOSIS — Q6119 Other polycystic kidney, infantile type: Secondary | ICD-10-CM | POA: Diagnosis not present

## 2016-10-25 DIAGNOSIS — Z96649 Presence of unspecified artificial hip joint: Secondary | ICD-10-CM | POA: Diagnosis present

## 2016-10-25 DIAGNOSIS — E669 Obesity, unspecified: Secondary | ICD-10-CM | POA: Diagnosis present

## 2016-10-25 DIAGNOSIS — E1122 Type 2 diabetes mellitus with diabetic chronic kidney disease: Secondary | ICD-10-CM | POA: Diagnosis present

## 2016-10-25 DIAGNOSIS — E1140 Type 2 diabetes mellitus with diabetic neuropathy, unspecified: Secondary | ICD-10-CM | POA: Diagnosis present

## 2016-10-25 DIAGNOSIS — R112 Nausea with vomiting, unspecified: Secondary | ICD-10-CM

## 2016-10-25 DIAGNOSIS — J810 Acute pulmonary edema: Secondary | ICD-10-CM

## 2016-10-25 DIAGNOSIS — J9621 Acute and chronic respiratory failure with hypoxia: Secondary | ICD-10-CM | POA: Diagnosis present

## 2016-10-25 DIAGNOSIS — R0602 Shortness of breath: Secondary | ICD-10-CM | POA: Diagnosis present

## 2016-10-25 DIAGNOSIS — Z8249 Family history of ischemic heart disease and other diseases of the circulatory system: Secondary | ICD-10-CM | POA: Diagnosis not present

## 2016-10-25 DIAGNOSIS — Z6837 Body mass index (BMI) 37.0-37.9, adult: Secondary | ICD-10-CM

## 2016-10-25 DIAGNOSIS — I2510 Atherosclerotic heart disease of native coronary artery without angina pectoris: Secondary | ICD-10-CM | POA: Diagnosis present

## 2016-10-25 DIAGNOSIS — I5023 Acute on chronic systolic (congestive) heart failure: Secondary | ICD-10-CM | POA: Diagnosis present

## 2016-10-25 DIAGNOSIS — K219 Gastro-esophageal reflux disease without esophagitis: Secondary | ICD-10-CM | POA: Diagnosis present

## 2016-10-25 DIAGNOSIS — E785 Hyperlipidemia, unspecified: Secondary | ICD-10-CM | POA: Diagnosis present

## 2016-10-25 DIAGNOSIS — Z9981 Dependence on supplemental oxygen: Secondary | ICD-10-CM

## 2016-10-25 DIAGNOSIS — J9601 Acute respiratory failure with hypoxia: Secondary | ICD-10-CM | POA: Diagnosis not present

## 2016-10-25 DIAGNOSIS — Z825 Family history of asthma and other chronic lower respiratory diseases: Secondary | ICD-10-CM

## 2016-10-25 DIAGNOSIS — Z91048 Other nonmedicinal substance allergy status: Secondary | ICD-10-CM

## 2016-10-25 DIAGNOSIS — R0902 Hypoxemia: Secondary | ICD-10-CM

## 2016-10-25 LAB — COMPREHENSIVE METABOLIC PANEL
ALT: 28 U/L (ref 14–54)
AST: 26 U/L (ref 15–41)
Albumin: 3.7 g/dL (ref 3.5–5.0)
Alkaline Phosphatase: 78 U/L (ref 38–126)
Anion gap: 8 (ref 5–15)
BUN: 20 mg/dL (ref 6–20)
CO2: 31 mmol/L (ref 22–32)
Calcium: 8.5 mg/dL — ABNORMAL LOW (ref 8.9–10.3)
Chloride: 102 mmol/L (ref 101–111)
Creatinine, Ser: 1.61 mg/dL — ABNORMAL HIGH (ref 0.44–1.00)
GFR calc Af Amer: 35 mL/min — ABNORMAL LOW (ref >60)
GFR calc non Af Amer: 30 mL/min — ABNORMAL LOW (ref >60)
Glucose, Bld: 362 mg/dL — ABNORMAL HIGH (ref 65–99)
Potassium: 5.3 mmol/L — ABNORMAL HIGH (ref 3.5–5.1)
Sodium: 141 mmol/L (ref 135–145)
Total Bilirubin: 0.8 mg/dL (ref 0.3–1.2)
Total Protein: 7.1 g/dL (ref 6.5–8.1)

## 2016-10-25 LAB — LACTIC ACID, PLASMA
Lactic Acid, Venous: 0.7 mmol/L (ref 0.5–1.9)
Lactic Acid, Venous: 2.5 mmol/L (ref 0.5–1.9)
Lactic Acid, Venous: 2.5 mmol/L (ref 0.5–1.9)

## 2016-10-25 LAB — BRAIN NATRIURETIC PEPTIDE: B Natriuretic Peptide: 504 pg/mL — ABNORMAL HIGH (ref 0.0–100.0)

## 2016-10-25 LAB — CBC
HCT: 33.6 % — ABNORMAL LOW (ref 35.0–47.0)
Hemoglobin: 9.9 g/dL — ABNORMAL LOW (ref 12.0–16.0)
MCH: 26.2 pg (ref 26.0–34.0)
MCHC: 29.6 g/dL — ABNORMAL LOW (ref 32.0–36.0)
MCV: 88.6 fL (ref 80.0–100.0)
Platelets: 355 10*3/uL (ref 150–440)
RBC: 3.79 MIL/uL — ABNORMAL LOW (ref 3.80–5.20)
RDW: 19 % — ABNORMAL HIGH (ref 11.5–14.5)
WBC: 14 10*3/uL — ABNORMAL HIGH (ref 3.6–11.0)

## 2016-10-25 LAB — GLUCOSE, CAPILLARY
Glucose-Capillary: 204 mg/dL — ABNORMAL HIGH (ref 65–99)
Glucose-Capillary: 275 mg/dL — ABNORMAL HIGH (ref 65–99)
Glucose-Capillary: 327 mg/dL — ABNORMAL HIGH (ref 65–99)

## 2016-10-25 LAB — PROCALCITONIN: Procalcitonin: 0.22 ng/mL

## 2016-10-25 LAB — TROPONIN I: Troponin I: <0.03 ng/mL (ref <0.03)

## 2016-10-25 LAB — MRSA PCR SCREENING: MRSA by PCR: NEGATIVE

## 2016-10-25 MED ORDER — METHYLPREDNISOLONE SODIUM SUCC 40 MG IJ SOLR
40.0000 mg | Freq: Four times a day (QID) | INTRAMUSCULAR | Status: DC
  Administered 2016-10-25: 40 mg via INTRAVENOUS
  Filled 2016-10-25: qty 1

## 2016-10-25 MED ORDER — IPRATROPIUM-ALBUTEROL 0.5-2.5 (3) MG/3ML IN SOLN
3.0000 mL | Freq: Once | RESPIRATORY_TRACT | Status: AC
  Administered 2016-10-25: 3 mL via RESPIRATORY_TRACT
  Filled 2016-10-25: qty 3

## 2016-10-25 MED ORDER — INSULIN ASPART 100 UNIT/ML ~~LOC~~ SOLN
SUBCUTANEOUS | Status: AC
Start: 2016-10-25 — End: 2016-10-25
  Administered 2016-10-25: 7 [IU] via SUBCUTANEOUS
  Filled 2016-10-25: qty 1

## 2016-10-25 MED ORDER — BUDESONIDE 0.5 MG/2ML IN SUSP
0.5000 mg | Freq: Two times a day (BID) | RESPIRATORY_TRACT | Status: DC
  Administered 2016-10-25 – 2016-10-26 (×2): 0.5 mg via RESPIRATORY_TRACT
  Filled 2016-10-25 (×2): qty 2

## 2016-10-25 MED ORDER — ONDANSETRON HCL 4 MG/2ML IJ SOLN
4.0000 mg | Freq: Four times a day (QID) | INTRAMUSCULAR | Status: DC | PRN
  Administered 2016-10-28 (×2): 4 mg via INTRAVENOUS
  Filled 2016-10-25 (×2): qty 2

## 2016-10-25 MED ORDER — FUROSEMIDE 10 MG/ML IJ SOLN
60.0000 mg | Freq: Once | INTRAMUSCULAR | Status: AC
  Administered 2016-10-25: 60 mg via INTRAVENOUS
  Filled 2016-10-25: qty 8

## 2016-10-25 MED ORDER — DEXTROSE 5 % IV SOLN
2.0000 g | INTRAVENOUS | Status: DC
  Administered 2016-10-25: 2 g via INTRAVENOUS
  Filled 2016-10-25 (×2): qty 2

## 2016-10-25 MED ORDER — IPRATROPIUM-ALBUTEROL 0.5-2.5 (3) MG/3ML IN SOLN
3.0000 mL | Freq: Four times a day (QID) | RESPIRATORY_TRACT | Status: DC
  Administered 2016-10-25 – 2016-10-26 (×3): 3 mL via RESPIRATORY_TRACT
  Filled 2016-10-25 (×3): qty 3

## 2016-10-25 MED ORDER — INSULIN ASPART 100 UNIT/ML ~~LOC~~ SOLN
0.0000 [IU] | SUBCUTANEOUS | Status: DC
  Administered 2016-10-25: 5 [IU] via SUBCUTANEOUS
  Administered 2016-10-25: 7 [IU] via SUBCUTANEOUS
  Administered 2016-10-25: 3 [IU] via SUBCUTANEOUS
  Administered 2016-10-26 (×2): 2 [IU] via SUBCUTANEOUS
  Filled 2016-10-25 (×4): qty 1

## 2016-10-25 MED ORDER — IPRATROPIUM-ALBUTEROL 0.5-2.5 (3) MG/3ML IN SOLN
RESPIRATORY_TRACT | Status: AC
  Filled 2016-10-25: qty 3

## 2016-10-25 MED ORDER — ONDANSETRON HCL 4 MG PO TABS
4.0000 mg | ORAL_TABLET | Freq: Four times a day (QID) | ORAL | Status: DC | PRN
  Administered 2016-10-28: 4 mg via ORAL
  Filled 2016-10-25 (×2): qty 1

## 2016-10-25 MED ORDER — METHYLPREDNISOLONE SODIUM SUCC 40 MG IJ SOLR
40.0000 mg | Freq: Two times a day (BID) | INTRAMUSCULAR | Status: DC
  Administered 2016-10-26: 40 mg via INTRAVENOUS
  Filled 2016-10-25: qty 1

## 2016-10-25 MED ORDER — ACETAMINOPHEN 325 MG PO TABS: 650.0000 mg | ORAL_TABLET | Freq: Four times a day (QID) | ORAL | Status: DC | PRN

## 2016-10-25 MED ORDER — ACETAMINOPHEN 650 MG RE SUPP: 650.0000 mg | Freq: Four times a day (QID) | RECTAL | Status: DC | PRN

## 2016-10-25 MED ORDER — METHYLPREDNISOLONE SODIUM SUCC 125 MG IJ SOLR
125.0000 mg | Freq: Once | INTRAMUSCULAR | Status: AC
  Administered 2016-10-25: 125 mg via INTRAVENOUS
  Filled 2016-10-25: qty 2

## 2016-10-25 MED ORDER — FUROSEMIDE 10 MG/ML IJ SOLN
40.0000 mg | Freq: Two times a day (BID) | INTRAMUSCULAR | Status: DC
  Administered 2016-10-25 – 2016-10-28 (×7): 40 mg via INTRAVENOUS
  Filled 2016-10-25 (×7): qty 4

## 2016-10-25 MED ORDER — HEPARIN SODIUM (PORCINE) 5000 UNIT/ML IJ SOLN
5000.0000 [IU] | Freq: Three times a day (TID) | INTRAMUSCULAR | Status: DC
  Administered 2016-10-25 – 2016-10-29 (×12): 5000 [IU] via SUBCUTANEOUS
  Filled 2016-10-25 (×12): qty 1

## 2016-10-25 MED ORDER — BLISTEX MEDICATED EX OINT
TOPICAL_OINTMENT | CUTANEOUS | Status: DC | PRN
  Filled 2016-10-25: qty 6.3

## 2016-10-25 NOTE — ED Triage Notes (Signed)
Pt arrived via ems from home. PT wears 2L o2 at home. Pt's o2 saturation in low 80s upon arrival, pt placed on non re breather and respiratory called. Pt alert and able to answer questions.

## 2016-10-25 NOTE — ED Provider Notes (Signed)
[facility] Emergency Department Provider Note  Time seen: 1:08 PM  I have reviewed the triage vital signs and the nursing notes.   HISTORY  Chief Complaint Shortness of Breath    HPI Mckenzie Taylor is a 76 y.o. female With a past medical history of CAD, CHF, COPD, hypertension, presents to the emergency department for shortness of breath. According to the patient since yesterday morning she has been feeling short of breath which has worsened over the day yesterday and into today. Patient normally wears 2 L of oxygen 24/7. EMS states significant shortness of breath upon their arrival, gave 10 mg of albuterol. Patient state patient was satting in the 80s on nasal cannula. Placed on a nonrebreather currently satting in the mid 90s on a nonrebreather. Patient denies any chest pain. Patient does have lower extremity swelling which she states is increased. Patient overall appears quite somnolent and fatigued.  Past Medical History:  Diagnosis Date  . Autosomal recessive polycystic kidneys   . Avascular necrosis of hip (HCC)    bilateral  . CAD (coronary artery disease)    mild  . CHF (congestive heart failure) ([place])   . Chronic kidney disease    cyst  . COPD (chronic obstructive pulmonary disease) ([place])   . Diabetes type 2, controlled ([place])   . GERD (gastroesophageal reflux disease)   . HTN (hypertension)   . Hypothyroidism    subclinical. low TSH, normal thyroid panel. biopsy 2010  . Vitamin B12 deficiency     Patient Active Problem List   Diagnosis Date Noted  . COPD with acute exacerbation ([place]) 08/14/2016  . CAP (community acquired pneumonia) 08/14/2016  . Acute systolic CHF (congestive heart failure) ([place]) 08/14/2016  . Accelerated hypertension 08/14/2016  . GERD (gastroesophageal reflux disease) 08/14/2016  . Diabetes ([place]) 08/14/2016  . Respiratory distress 05/05/2016  . Acute respiratory failure ([place]) 03/24/2016  . Multinodular goiter  (nontoxic) 05/04/2011  . TOBACCO ABUSE 12/17/2008  . Coronary atherosclerosis of native coronary artery 12/17/2008  . 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 CLAUDICATION, INTERMITTENT 12/17/2008    Past Surgical History:  Procedure Laterality Date  . CHOLECYSTECTOMY    . hip replacement     bilateral-secondary to avascular necrosis  . right eye lens replacement    . ULNAR NERVE REPAIR     bilateral  . VESICOVAGINAL FISTULA CLOSURE W/ TAH      Prior to Admission medications   Medication Sig Start Date End Date Taking? Authorizing Provider  albuterol (PROVENTIL) (2.5 MG/3ML) 0.083% nebulizer solution Take by nebulization every 4 (four) hours as needed for wheezing or shortness of breath.    [provider]  albuterol (VENTOLIN HFA) 108 (90 BASE) MCG/ACT inhaler Inhale 2 puffs into the lungs every 6 (six) hours as needed.      [provider]  amLODipine (NORVASC) 10 MG tablet Take 10 mg by mouth daily.      [provider]  atorvastatin (LIPITOR) 40 MG tablet Take 40 mg by mouth daily.    [provider]  FLUoxetine (PROZAC) 40 MG capsule Take 40 mg by mouth.    [provider]  furosemide (LASIX) 40 MG tablet Take 40 mg by mouth daily. 04/06/16   [provider]  gabapentin (NEURONTIN) 100 MG capsule Take 200 mg by mouth 3 (three) times daily.    [provider]  glipiZIDE (GLUCOTROL XL) 2.5 MG 24 hr tablet Take 2.5 mg by mouth daily.  [provider]  ibuprofen (ADVIL,MOTRIN) 800 MG tablet Take 800 mg by mouth every 8 (eight) hours as needed for mild pain.     [provider]  ipratropium-albuterol (DUONEB) 0.5-2.5 (3) MG/3ML SOLN Take 3 mLs by nebulization 4 (four) times daily. 03/28/16   Vaughan Basta, MD  LEVEMIR FLEXTOUCH 100 UNIT/ML Pen Inject 15 Units into the skin at bedtime. 07/23/16   [provider]  metoprolol tartrate (LOPRESSOR) 25 MG tablet Take 1  tablet (25 mg total) by mouth 2 (two) times daily. 08/19/16   Vaughan Basta, MD  NOVOLOG FLEXPEN 100 UNIT/ML FlexPen Inject 2-10 Units into the skin 3 (three) times daily as needed. Sliding scale if blood sugar is above 200. 04/02/16   [provider]  omeprazole (PRILOSEC) 20 MG capsule Take 20 mg by mouth daily.    [provider]  oxyCODONE-acetaminophen (PERCOCET) 10-325 MG tablet Take 1 tablet by mouth every 8 (eight) hours as needed for pain.  04/21/16   [provider]  predniSONE (STERAPRED UNI-PAK 21 TAB) 10 MG (21) TBPK tablet Take 6 tabs first day, 5 tab on day 2, then 4 on day 3rd, 3 tabs on day 4th , 2 tab on day 5th, and 1 tab on 6th day. 08/19/16   Vaughan Basta, MD  tiZANidine (ZANAFLEX) 4 MG capsule Take 4 mg by mouth 3 (three) times daily as needed for muscle spasms.    [provider]  traZODone (DESYREL) 50 MG tablet Take 50 mg by mouth at bedtime. 08/10/16   [provider]  umeclidinium bromide (INCRUSE ELLIPTA) 62.5 MCG/INH AEPB Inhale 1 puff into the lungs daily.    [provider]    No Known Allergies  Family History  Problem Relation Age of Onset  . Cancer Father        lung  . COPD Mother   . Heart disease Mother   . Heart disease Maternal Uncle     Social History Social History  Substance Use Topics  . Smoking status: Former Smoker    Packs/day: 2.00  . Smokeless tobacco: Never Used     Comment: 1 ppd - 50 years   . Alcohol use No    Review of Systems Constitutional: Negative for fever Cardiovascular: Negative for chest pain. Respiratory: positive for shortness of breath Gastrointestinal: Negative for abdominal pain Musculoskeletal: positive for leg swelling. Denies pain. Neurological: Negative for headache All other ROS negative  ____________________________________________   PHYSICAL EXAM:  VITAL SIGNS: ED Triage Vitals  Enc Vitals Group     BP 10/25/16 1259 (!) 154/85      Pulse Rate 10/25/16 1259 (!) 109     Resp 10/25/16 1259 18     Temp 10/25/16 1259 97.6 F (36.4 C)     Temp Source 10/25/16 1259 Oral     SpO2 10/25/16 1259 (!) 88 %     Weight 10/25/16 1301 210 lb (95.3 kg)     Height 10/25/16 1301 5\' 6"  (1.676 m)     Head Circumference --      Peak Flow --      Pain Score --      Pain Loc --      Pain Edu? --      Excl. in [place]? --    Constitutional: Alert and oriented. fatigued appearing but answers questions and follows commands. Currently on a nonrebreather. Eyes: Normal exam ENT   Head: Normocephalic and atraumatic.  mild periorbital edema.   Mouth/Throat: Mucous  membranes are moist. Cardiovascular: Normal rate, regular rhythm around 100 bpm Respiratory: mild tachypnea. Patient with diffuse wheeze bilaterally but diminished breath sounds bilaterally. Gastrointestinal: Soft and nontender. No distention.  Musculoskeletal: Nontender with normal range of motion in all extremities. moderate lower extremity edema, equal bilaterally. Neurologic:  Normal speech and language. No gross focal neurologic deficits Skin:  Skin is warm, dry and intact.  Psychiatric: Mood and affect are normal.   ____________________________________________    EKG  EKG didn't interpreted by myself shows sinus tachycardia at 108 bpm, narrow QRS, normal axis, slightly prolonged QTC otherwise normal intervals, nonspecific ST changes. Electrical interference due to respiratory pattern.  ____________________________________________    RADIOLOGY  X-ray consistent with pulmonary edema.  ____________________________________________   INITIAL IMPRESSION / ASSESSMENT AND PLAN / ED COURSE  Pertinent labs & imaging results that were available during my care of the patient were reviewed by me and considered in my medical decision making (see chart for details).  patient presents to the emergency department for shortness of breath, worse since yesterday. Patient satting  in the 80s on nasal cannula, satting in the mid 90s on nonrebreather. Has diminished breath sounds with mild wheeze bilaterally. Significant swelling in her lower extremities. Suspect likely COPD versus CHF exacerbation. We will start the patient on BiPAP, dose breathing treatments as well as Solu-Medrol and continue to closely monitor.   CRITICAL CARE Performed by: Harvest Dark   Total critical care time: 30 minutes  Critical care time was exclusive of separately billable procedures and treating other patients.  Critical care was necessary to treat or prevent imminent or life-threatening deterioration.  Critical care was time spent personally by me on the following activities: development of treatment plan with patient and/or surrogate as well as nursing, discussions with consultants, evaluation of patient's response to treatment, examination of patient, obtaining history from patient or surrogate, ordering and performing treatments and interventions, ordering and review of laboratory studies, ordering and review of radiographic studies, pulse oximetry and re-evaluation of patient's condition.   x-rays consistent with pulmonary edema we'll start on Lasix and admit to the hospital for further treatment. ____________________________________________   FINAL CLINICAL IMPRESSION(S) / ED DIAGNOSES  dyspnea hypoxia CHF exacerbation Pulmonary edema   Harvest Dark, MD 10/25/16 1353

## 2016-10-25 NOTE — ED Notes (Signed)
External female catheter placed.

## 2016-10-25 NOTE — Consult Note (Signed)
Name: Mckenzie Taylor MRN: [id number] DOB: [birth year]    ADMISSION DATE:  10/25/2016 CONSULTATION DATE: 10/25/2016  REFERRING MD : Dr. Verdell Carmine  CHIEF COMPLAINT: Shortness of Breath   BRIEF PATIENT DESCRIPTION:  76 year old female admitted 08/13 with acute on chronic respiratory failure with hypoxia secondary to COPD and CHF exacerbation requiring continuous BiPAP.  SIGNIFICANT EVENTS  08/13-Pt admitted to [facility]   STUDIES:  None   HISTORY OF PRESENT ILLNESS:   This is a 76 year old female with a past medical history of CAD, chronic systolic CHF, COPD, diabetes, hypertension, hyperlipidemia, GERD, hypothyroidism, and CKD stage III.  She presented to [facility] ER 08/13 with complaints of shortness of breath onset of symptoms 1 week ago, however the symptoms worsened prompting current ER visit.  The pt notified EMS due to symptoms and upon EMS arrival she was short of breath on her home O2 at 2L with O2 sats in the 80's, therefore she received an albuterol treatment with minimal improvement of symptoms. She is also concerned about possibly having cellulitis in the right lower extremity due to redness and warmth.  She had similar symptoms in the left lower extremity 2 weeks ago and was diagnosed with cellulitis by her endocrinologist, therefore she was prescribed and completed a course of antibiotics.  Upon arrival to the ER she was on a nonrebreather mask with O2 sats in the mid 90s, however due to persistent respiratory distress and somnolence she was placed on continuous BiPAP. She was subsequently admitted by the hospitalist team to the stepdown unit for further workup and treatment PCCM consulted.  PAST MEDICAL HISTORY :   has a past medical history of Autosomal recessive polycystic kidneys; Avascular necrosis of hip (HCC); CAD (coronary artery disease); CHF (congestive heart failure) ([place]); Chronic kidney disease; COPD (chronic obstructive pulmonary disease) ([place]); Diabetes type 2,  controlled ([place]); GERD (gastroesophageal reflux disease); HTN (hypertension); Hypothyroidism; and Vitamin B12 deficiency.  has a past surgical history that includes hip replacement; Vesicovaginal fistula closure w/ TAH; Cholecystectomy; right eye lens replacement; and Ulnar nerve repair. Prior to Admission medications   Medication Sig Start Date End Date Taking? Authorizing Provider  albuterol (PROVENTIL) (2.5 MG/3ML) 0.083% nebulizer solution Take by nebulization every 4 (four) hours as needed for wheezing or shortness of breath.   Yes [provider]  albuterol (VENTOLIN HFA) 108 (90 BASE) MCG/ACT inhaler Inhale 2 puffs into the lungs every 4 (four) hours as needed.    Yes [provider]  amLODipine (NORVASC) 10 MG tablet Take 10 mg by mouth daily.     Yes [provider]  atorvastatin (LIPITOR) 40 MG tablet Take 40 mg by mouth daily.   Yes [provider]  cephALEXin (KEFLEX) 500 MG capsule Take 500 mg by mouth 3 (three) times daily.   Yes [provider]  Cholecalciferol (VITAMIN D3) 5000 units CAPS Take 1 capsule by mouth daily.   Yes [provider]  Cyanocobalamin 1500 MCG TBDP Take 3 tablets by mouth daily.   Yes [provider]  FLUoxetine (PROZAC) 40 MG capsule Take 40 mg by mouth.   Yes [provider]  furosemide (LASIX) 40 MG tablet Take 40 mg by mouth daily. 04/06/16  Yes [provider]  gabapentin (NEURONTIN) 100 MG capsule Take 200 mg by mouth 3 (three) times daily.   Yes [provider]  glipiZIDE (GLUCOTROL XL) 2.5 MG 24 hr tablet Take 2.5 mg by mouth daily.    Yes [provider]  ibuprofen (ADVIL,MOTRIN) 800 MG tablet Take 800 mg by mouth every 8 (eight) hours as needed for mild pain.    Yes [provider]  ipratropium-albuterol (DUONEB) 0.5-2.5 (3) MG/3ML SOLN Take 3 mLs by nebulization 4 (four) times daily. 03/28/16  Yes Vaughan Basta, MD  LEVEMIR FLEXTOUCH 100  UNIT/ML Pen Inject 18 Units into the skin at bedtime.  07/23/16  Yes [provider]  metoprolol tartrate (LOPRESSOR) 25 MG tablet Take 1 tablet (25 mg total) by mouth 2 (two) times daily. 08/19/16  Yes Vaughan Basta, MD  NOVOLOG FLEXPEN 100 UNIT/ML FlexPen Inject 2-10 Units into the skin 3 (three) times daily as needed. Sliding scale if blood sugar is above 200. 04/02/16  Yes [provider]  omeprazole (PRILOSEC) 20 MG capsule Take 20 mg by mouth daily.   Yes [provider]  oxyCODONE-acetaminophen (PERCOCET) 10-325 MG tablet Take 1 tablet by mouth every 8 (eight) hours as needed for pain.  04/21/16  Yes [provider]  promethazine (PHENERGAN) 25 MG tablet Take 1 tablet by mouth daily. 08/16/14  Yes [provider]  traZODone (DESYREL) 50 MG tablet Take 50 mg by mouth at bedtime. 08/10/16  Yes [provider]  umeclidinium bromide (INCRUSE ELLIPTA) 62.5 MCG/INH AEPB Inhale 1 puff into the lungs daily.   Yes [provider]  predniSONE (STERAPRED UNI-PAK 21 TAB) 10 MG (21) TBPK tablet Take 6 tabs first day, 5 tab on day 2, then 4 on day 3rd, 3 tabs on day 4th , 2 tab on day 5th, and 1 tab on 6th day. Patient not taking: Reported on 10/25/2016 08/19/16   Vaughan Basta, MD  tiZANidine (ZANAFLEX) 4 MG capsule Take 4 mg by mouth 3 (three) times daily as needed for muscle spasms.    [provider]   Allergies  Allergen Reactions  . Other Rash    Pt reports allergy to metals.    FAMILY HISTORY:  family history includes COPD in her mother; Cancer in her father; Heart disease in her maternal uncle and mother. SOCIAL HISTORY:  reports that she has quit smoking. She has a 50.00 pack-year smoking history. She has never used smokeless tobacco. She reports that she does not drink alcohol or use drugs.  REVIEW OF SYSTEMS: Positives in BOLD  Constitutional: Negative for fever, chills, weight loss, malaise/fatigue and  diaphoresis.  HENT: Negative for hearing loss, ear pain, nosebleeds, congestion, sore throat, neck pain, tinnitus and ear discharge.   Eyes: Negative for blurred vision, double vision, photophobia, pain, discharge and redness.  Respiratory: cough, hemoptysis, sputum production, shortness of breath, wheezing and stridor.  Cardiovascular: Negative for chest pain, palpitations, orthopnea, claudication, leg swelling and PND.  Gastrointestinal: Negative for heartburn, nausea, vomiting, abdominal pain, diarrhea, constipation, blood in stool and melena.  Genitourinary: Negative for dysuria, urgency, frequency, hematuria and flank pain.  Musculoskeletal: Negative for myalgias, back pain, joint pain and falls.  Skin: redness and warmth right lower extremity, itching and rash.  Neurological: Negative for dizziness, tingling, tremors, sensory change, speech change, focal weakness, seizures, loss of consciousness, weakness and headaches.  Endo/Heme/Allergies: Negative for environmental allergies and polydipsia. Does not bruise/bleed easily.  SUBJECTIVE:  Patient states her breathing has improved since being placed on BiPAP, however she is concerned about the redness and warmth in her right lower extremity.  VITAL SIGNS: Temp:  [97.1 F (36.2 C)-98.2 F (36.8 C)] 97.1 F (36.2 C) (08/13 1918) Pulse Rate:  [101-109] 106 (08/13 1918) Resp:  [16-29] 29 (  08/13 1918) BP: (143-163)/(79-97) 151/89 (08/13 1900) SpO2:  [88 %-99 %] 97 % (08/13 1918) FiO2 (%):  [35 %-40 %] 35 % (08/13 1701) Weight:  [95.3 kg (210 lb)-103 kg (227 lb 1.2 oz)] 103 kg (227 lb 1.2 oz) (08/13 1607)  PHYSICAL EXAMINATION: General: Well-developed, well-nourished Caucasian female, NAD Neuro: Alert and oriented, follows commands HEENT: Supple, no JVD Cardiovascular: S1-S2, RRR, no M/R/G Lungs: Expiratory wheezes throughout, even, nonlabored; no rales rhonchi or crackles Abdomen: Positive bowel sounds 4, obese, soft, nontender,  nondistended Musculoskeletal: No edema, moves all extremities Skin: Bilateral lower extremity redness and warmth   Recent Labs Lab 10/25/16 1302  NA 141  K 5.3*  CL 102  CO2 31  BUN 20  CREATININE 1.61*  GLUCOSE 362*    Recent Labs Lab 10/25/16 1302  HGB 9.9*  HCT 33.6*  WBC 14.0*  PLT 355   Dg Chest Portable 1 View  Result Date: 10/25/2016 CLINICAL DATA:  Shortness of breath. History of COPD, CHF, former smoker, coronary artery disease, diabetes. EXAM: PORTABLE CHEST 1 VIEW COMPARISON:  Portable chest x-ray of August 14, 2016 FINDINGS: The lungs are less well inflated today. There bilateral pleural effusions which appear new. The cardiac silhouette is enlarged. The pulmonary vascularity is engorged. Confluent alveolar opacities are present in the left mid and lower lung. There is calcification in the wall of the aortic arch. IMPRESSION: CHF with interstitial and alveolar edema and bilateral pleural effusions. Thoracic aortic atherosclerosis. Electronically Signed   By: David  Martinique M.D.   On: 10/25/2016 13:39    ASSESSMENT / PLAN: Acute on chronic hypoxic respiratory failure secondary to COPD and CHF exacerbation Lactic Acidosis  Acute on chronic renal failure Leukocytosis secondary to ?bilateral lower extremity cellulitis  Anemia without acute blood loss Hx: Diabetes Mellitus, CAD, Hypothyroidism, Hyperlipidemia, and GERD  P: Continuous BiPAP for now wean as tolerated Maintain O2 sats 88% to 92% Repeat chest x-ray in the a.m Scheduled and prn bronchodilator therapy Nebulized and IV steroids Continue IV Lasix Trend PCT and lactic acid Trend WBC and monitor fever curve Will start antibiotics Trend BMP Monitor urinary output Replace electrolytes as indicated Subcutaneous heparin for DVT prophylaxis Trend CBC Monitor for s/sx of bleeding CBG's ac/hs and SSI   Marda Stalker, [place] Pager [phone] (please enter 7 digits) PCCM Consult  Pager [phone] (please enter 7 digits)

## 2016-10-25 NOTE — Plan of Care (Signed)
Pt wanted to find out if we would treat her for cellulitis for her BLE. She Stated that "she was experiencing that in one leg and after she finished antibiotic keflex it went on the other leg" She stated that it is painful to touch E link notified and recommended to pass it on to NP

## 2016-10-25 NOTE — Progress Notes (Signed)
[place] Progress Note Patient Name: Mckenzie Taylor DOB: 01-16-1941 MRN: [id number]   Date of Service  10/25/2016  HPI/Events of Note  Hyperglycemia - Blood glucose = 327. Patient is on Solumedrol.  eICU Interventions  Will order: 1. Q 4 hour sensitive Novolog SSI.     Intervention Category Major Interventions: Hyperglycemia - active titration of insulin therapy  Lysle Dingwall 10/25/2016, 5:14 PM

## 2016-10-25 NOTE — Plan of Care (Signed)
Results for Mckenzie Taylor, Mckenzie Taylor (MRN [id number]) as of 10/25/2016 18:18  Ref. Range 10/25/2016 13:02 10/25/2016 16:44  Lactic Acid, Venous Latest Ref Range: 0.5 - 1.9 mmol/L 0.7 2.5 (HH)    Elink notified

## 2016-10-25 NOTE — Progress Notes (Signed)
eLink Physician-Brief Progress Note Patient Name: Mckenzie Taylor DOB: 1940-11-10 MRN: [id number]   Date of Service  10/25/2016  HPI/Events of Note  76 yo female with with a known history of Coronary artery disease, chronic systolic CHF, COPD, diabetes, hypertension, hyperlipidemia, GERD, chronic kidney disease stage III . Presents with acute on chronic respiratory failure with hypoxia-secondary to COPD exacerbation and also CHF. Video assessment @ 5:15 PM --> looks stable on BiPAP. Sat = 99% and RR = 20. As per prior note, blood glucose = 327 and Q 4 hour sensitive Novolog SSI ordered.   eICU Interventions  Continue present management.      Intervention Category Evaluation Type: New Patient Evaluation  Lysle Dingwall 10/25/2016, 5:18 PM

## 2016-10-25 NOTE — H&P (Signed)
[facility] at [place] NAME: Mckenzie Taylor    MR#:  [id number]  DATE OF BIRTH:  Jan 19, 1941  DATE OF ADMISSION:  10/25/2016  PRIMARY CARE PHYSICIAN: Denton Lank, MD   REQUESTING/REFERRING PHYSICIAN: Dr. Harvest Dark  CHIEF COMPLAINT:   Chief Complaint  Patient presents with  . Shortness of Breath    HISTORY OF PRESENT ILLNESS:  Mckenzie Taylor  is a 76 y.o. female with a known history of Coronary artery disease, chronic systolic CHF, COPD, diabetes, hypertension, hyperlipidemia, GERD, chronic kidney disease stage III who presents to the hospital due to shortness of breath. Patient says she's been feeling shortness of breath now for the past 2-3 days progressively getting worse. She is on oxygen at home at 3 L and despite being on oxygen and using her inhalers at home she was feeling more short of breath and therefore came to the ER for further evaluation. When EMS arrived at the patient's home patient's O2 sats were noted to be in the 80s. She came to the ER was emergently placed on BiPAP and since then her O2 sats have improved. Her chest x-ray findings were suggestive of pulmonary edema and CHF. She also had significant wheezing and therefore was given some Solu-Medrol and duo nebs. She is feeling a little bit better now and hospitalist services were contacted for further treatment and evaluation. Patient denies any chest pains, nausea, vomiting, abdominal pain, fever or chills, cough or any sick contacts. She denies any paroxysmal nocturnal dyspnea or orthopnea.  PAST MEDICAL HISTORY:   Past Medical History:  Diagnosis Date  . Autosomal recessive polycystic kidneys   . Avascular necrosis of hip (HCC)    bilateral  . CAD (coronary artery disease)    mild  . CHF (congestive heart failure) ([place])   . Chronic kidney disease    cyst  . COPD (chronic obstructive pulmonary disease) ([place])   . Diabetes type 2, controlled ([place])   . GERD  (gastroesophageal reflux disease)   . HTN (hypertension)   . Hypothyroidism    subclinical. low TSH, normal thyroid panel. biopsy 2010  . Vitamin B12 deficiency     PAST SURGICAL HISTORY:   Past Surgical History:  Procedure Laterality Date  . CHOLECYSTECTOMY    . hip replacement     bilateral-secondary to avascular necrosis  . right eye lens replacement    . ULNAR NERVE REPAIR     bilateral  . VESICOVAGINAL FISTULA CLOSURE W/ TAH      SOCIAL HISTORY:   Social History  Substance Use Topics  . Smoking status: Former Smoker    Packs/day: 1.00    Years: 50.00  . Smokeless tobacco: Never Used     Comment: 1 ppd - 50 years   . Alcohol use No    FAMILY HISTORY:   Family History  Problem Relation Age of Onset  . Cancer Father        lung  . COPD Mother   . Heart disease Mother   . Heart disease Maternal Uncle     DRUG ALLERGIES:  No Known Allergies  REVIEW OF SYSTEMS:   Review of Systems  Constitutional: Negative for fever and weight loss.  HENT: Negative for congestion, nosebleeds and tinnitus.   Eyes: Negative for blurred vision, double vision and redness.  Respiratory: Positive for shortness of breath and wheezing. Negative for cough and hemoptysis.   Cardiovascular: Positive for leg swelling. Negative for chest pain, orthopnea and PND.  Gastrointestinal: Negative for abdominal pain, diarrhea, melena, nausea and vomiting.  Genitourinary: Negative for dysuria, hematuria and urgency.  Musculoskeletal: Negative for falls and joint pain.  Neurological: Negative for dizziness, tingling, sensory change, focal weakness, seizures, weakness and headaches.  Endo/Heme/Allergies: Negative for polydipsia. Does not bruise/bleed easily.  Psychiatric/Behavioral: Negative for depression and memory loss. The patient is not nervous/anxious.     MEDICATIONS AT HOME:   Prior to Admission medications   Medication Sig Start Date End Date Taking? Authorizing Provider  albuterol  (PROVENTIL) (2.5 MG/3ML) 0.083% nebulizer solution Take by nebulization every 4 (four) hours as needed for wheezing or shortness of breath.    [provider]  albuterol (VENTOLIN HFA) 108 (90 BASE) MCG/ACT inhaler Inhale 2 puffs into the lungs every 6 (six) hours as needed.      [provider]  amLODipine (NORVASC) 10 MG tablet Take 10 mg by mouth daily.      [provider]  atorvastatin (LIPITOR) 40 MG tablet Take 40 mg by mouth daily.    [provider]  FLUoxetine (PROZAC) 40 MG capsule Take 40 mg by mouth.    [provider]  furosemide (LASIX) 40 MG tablet Take 40 mg by mouth daily. 04/06/16   [provider]  gabapentin (NEURONTIN) 100 MG capsule Take 200 mg by mouth 3 (three) times daily.    [provider]  glipiZIDE (GLUCOTROL XL) 2.5 MG 24 hr tablet Take 2.5 mg by mouth daily.     [provider]  ibuprofen (ADVIL,MOTRIN) 800 MG tablet Take 800 mg by mouth every 8 (eight) hours as needed for mild pain.     [provider]  ipratropium-albuterol (DUONEB) 0.5-2.5 (3) MG/3ML SOLN Take 3 mLs by nebulization 4 (four) times daily. 03/28/16   Vaughan Basta, MD  LEVEMIR FLEXTOUCH 100 UNIT/ML Pen Inject 15 Units into the skin at bedtime. 07/23/16   [provider]  metoprolol tartrate (LOPRESSOR) 25 MG tablet Take 1 tablet (25 mg total) by mouth 2 (two) times daily. 08/19/16   Vaughan Basta, MD  NOVOLOG FLEXPEN 100 UNIT/ML FlexPen Inject 2-10 Units into the skin 3 (three) times daily as needed. Sliding scale if blood sugar is above 200. 04/02/16   [provider]  omeprazole (PRILOSEC) 20 MG capsule Take 20 mg by mouth daily.    [provider]  oxyCODONE-acetaminophen (PERCOCET) 10-325 MG tablet Take 1 tablet by mouth every 8 (eight) hours as needed for pain.  04/21/16   [provider]  predniSONE (STERAPRED UNI-PAK 21 TAB) 10 MG (21) TBPK tablet Take 6 tabs first day, 5  tab on day 2, then 4 on day 3rd, 3 tabs on day 4th , 2 tab on day 5th, and 1 tab on 6th day. 08/19/16   Vaughan Basta, MD  tiZANidine (ZANAFLEX) 4 MG capsule Take 4 mg by mouth 3 (three) times daily as needed for muscle spasms.    [provider]  traZODone (DESYREL) 50 MG tablet Take 50 mg by mouth at bedtime. 08/10/16   [provider]  umeclidinium bromide (INCRUSE ELLIPTA) 62.5 MCG/INH AEPB Inhale 1 puff into the lungs daily.    [provider]      VITAL SIGNS:  Blood pressure (!) 151/79, pulse (!) 101, temperature 97.6 F (36.4 C), temperature source Oral, resp. rate 19, height 5\' 6"  (1.676 m), weight 95.3 kg (210 lb), SpO2 96 %.  PHYSICAL EXAMINATION:  Physical Exam  GENERAL:  76 y.o.-year-old patient lying in  the bed in mild Resp. Distress.   EYES: Pupils equal, round, reactive to light and accommodation. No scleral icterus. Extraocular muscles intact.  HEENT: Head atraumatic, normocephalic. Oropharynx and nasopharynx clear. No oropharyngeal erythema, moist oral mucosa  NECK:  Supple, no jugular venous distention. No thyroid enlargement, no tenderness.  LUNGS: Good air entry bilaterally, diffuse rhonchi and wheezing bilaterally, bibasilar Rales. Positive use of accessory muscles.  CARDIOVASCULAR: S1, S2 RRR. No murmurs, rubs, gallops, clicks.  ABDOMEN: Soft, nontender, nondistended. Bowel sounds present. No organomegaly or mass.  EXTREMITIES: +1-2 edema b/l, No cyanosis, clubbing. + 2 pedal & radial pulses b/l.   NEUROLOGIC: Cranial nerves II through XII are intact. No focal Motor or sensory deficits appreciated b/l PSYCHIATRIC: The patient is alert and oriented x 3.  SKIN: No obvious rash, lesion, or ulcer.   LABORATORY PANEL:   CBC  Recent Labs Lab 10/25/16 1302  WBC 14.0*  HGB 9.9*  HCT 33.6*  PLT 355   ------------------------------------------------------------------------------------------------------------------  Chemistries    Recent Labs Lab 10/25/16 1302  NA 141  K 5.3*  CL 102  CO2 31  GLUCOSE 362*  BUN 20  CREATININE 1.61*  CALCIUM 8.5*  AST 26  ALT 28  ALKPHOS 78  BILITOT 0.8   ------------------------------------------------------------------------------------------------------------------  Cardiac Enzymes  Recent Labs Lab 10/25/16 1302  TROPONINI <0.03   ------------------------------------------------------------------------------------------------------------------  RADIOLOGY:  Dg Chest Portable 1 View  Result Date: 10/25/2016 CLINICAL DATA:  Shortness of breath. History of COPD, CHF, former smoker, coronary artery disease, diabetes. EXAM: PORTABLE CHEST 1 VIEW COMPARISON:  Portable chest x-ray of August 14, 2016 FINDINGS: The lungs are less well inflated today. There bilateral pleural effusions which appear new. The cardiac silhouette is enlarged. The pulmonary vascularity is engorged. Confluent alveolar opacities are present in the left mid and lower lung. There is calcification in the wall of the aortic arch. IMPRESSION: CHF with interstitial and alveolar edema and bilateral pleural effusions. Thoracic aortic atherosclerosis. Electronically Signed   By: David  Martinique M.D.   On: 10/25/2016 13:39     IMPRESSION AND PLAN:   Mckenzie Taylor  is a 76 y.o. female with a known history of Coronary artery disease, chronic systolic CHF, COPD, diabetes, hypertension, hyperlipidemia, GERD, chronic kidney disease stage III who presents to the hospital due to shortness of breath.  1. Acute on chronic respiratory failure with hypoxia-secondary to COPD exacerbation and also CHF. -Continue BiPAP support. We'll treat the patient's COPD with IV steroids, scheduled DuoNeb's, Pulmicort nebs. -we'll give IV Lasix for her CHF, follow I's and O's and daily weights. Wean off BiPAP as tolerated. We'll consult pulmonary.  2. CHF-acute on chronic systolic CHF.  -we'll diurese the patient with IV Lasix, follow  I's and O's and daily weights.  3. COPD-without acute exacerbation. Chest x-ray negative for any acute pneumonia. Treat patient with IV steroids, scheduled DuoNeb's, Pulmicort nebs. Follow clinically. Patient is on oxygen at home.  4. Diabetes-Place on sliding scale insulin, follow blood sugars.  5. Essential hypertension-continue Norvasc, metoprolol.  6. Hyperlipidemia-continue atorvastatin.  7. Diabetic neuropathy-continue gabapentin.  8. Depression-continue Prozac.   All the records are reviewed and case discussed with ED provider. Management plans discussed with the patient, family and they are in agreement.  CODE STATUS: Full code  TOTAL Critical Care TIME TAKING CARE OF THIS PATIENT: 45 minutes.    Henreitta Leber M.D on 10/25/2016 at 3:11 PM  Between 7am to 6pm - Pager - [phone]  After 6pm go to [email or web address] -  password [email or web address] [facility]  [facility]  Office  [phone]  CC: Primary care physician; Denton Lank, MD

## 2016-10-26 ENCOUNTER — Encounter: Payer: Self-pay | Admitting: *Deleted

## 2016-10-26 DIAGNOSIS — I509 Heart failure, unspecified: Secondary | ICD-10-CM

## 2016-10-26 DIAGNOSIS — J810 Acute pulmonary edema: Secondary | ICD-10-CM

## 2016-10-26 DIAGNOSIS — J9601 Acute respiratory failure with hypoxia: Secondary | ICD-10-CM

## 2016-10-26 LAB — CBC
HCT: 29.1 % — ABNORMAL LOW (ref 35.0–47.0)
Hemoglobin: 9.3 g/dL — ABNORMAL LOW (ref 12.0–16.0)
MCH: 26.6 pg (ref 26.0–34.0)
MCHC: 32 g/dL (ref 32.0–36.0)
MCV: 83.2 fL (ref 80.0–100.0)
Platelets: 295 10*3/uL (ref 150–440)
RBC: 3.5 MIL/uL — ABNORMAL LOW (ref 3.80–5.20)
RDW: 18.6 % — ABNORMAL HIGH (ref 11.5–14.5)
WBC: 8.2 10*3/uL (ref 3.6–11.0)

## 2016-10-26 LAB — GLUCOSE, CAPILLARY
Glucose-Capillary: 199 mg/dL — ABNORMAL HIGH (ref 65–99)
Glucose-Capillary: 207 mg/dL — ABNORMAL HIGH (ref 65–99)
Glucose-Capillary: 213 mg/dL — ABNORMAL HIGH (ref 65–99)
Glucose-Capillary: 229 mg/dL — ABNORMAL HIGH (ref 65–99)
Glucose-Capillary: 285 mg/dL — ABNORMAL HIGH (ref 65–99)

## 2016-10-26 LAB — BASIC METABOLIC PANEL
Anion gap: 9 (ref 5–15)
BUN: 23 mg/dL — ABNORMAL HIGH (ref 6–20)
CO2: 36 mmol/L — ABNORMAL HIGH (ref 22–32)
Calcium: 8.8 mg/dL — ABNORMAL LOW (ref 8.9–10.3)
Chloride: 99 mmol/L — ABNORMAL LOW (ref 101–111)
Creatinine, Ser: 1.48 mg/dL — ABNORMAL HIGH (ref 0.44–1.00)
GFR calc Af Amer: 39 mL/min — ABNORMAL LOW (ref >60)
GFR calc non Af Amer: 34 mL/min — ABNORMAL LOW (ref >60)
Glucose, Bld: 180 mg/dL — ABNORMAL HIGH (ref 65–99)
Potassium: 4 mmol/L (ref 3.5–5.1)
Sodium: 144 mmol/L (ref 135–145)

## 2016-10-26 LAB — LACTIC ACID, PLASMA: Lactic Acid, Venous: 0.8 mmol/L (ref 0.5–1.9)

## 2016-10-26 LAB — PROCALCITONIN: Procalcitonin: 0.22 ng/mL

## 2016-10-26 MED ORDER — ALBUTEROL SULFATE (2.5 MG/3ML) 0.083% IN NEBU
2.5000 mg | INHALATION_SOLUTION | RESPIRATORY_TRACT | Status: DC
  Administered 2016-10-26 – 2016-10-29 (×18): 2.5 mg via RESPIRATORY_TRACT
  Filled 2016-10-26 (×18): qty 3

## 2016-10-26 MED ORDER — METHYLPREDNISOLONE SODIUM SUCC 40 MG IJ SOLR
40.0000 mg | Freq: Two times a day (BID) | INTRAMUSCULAR | Status: DC
  Administered 2016-10-26 – 2016-10-27 (×3): 40 mg via INTRAVENOUS
  Filled 2016-10-26 (×3): qty 1

## 2016-10-26 MED ORDER — ORAL CARE MOUTH RINSE
15.0000 mL | Freq: Two times a day (BID) | OROMUCOSAL | Status: DC
  Administered 2016-10-26: 15 mL via OROMUCOSAL

## 2016-10-26 MED ORDER — INSULIN ASPART 100 UNIT/ML ~~LOC~~ SOLN
0.0000 [IU] | SUBCUTANEOUS | Status: DC
  Administered 2016-10-26: 8 [IU] via SUBCUTANEOUS
  Administered 2016-10-26: 5 [IU] via SUBCUTANEOUS
  Administered 2016-10-26: 2 [IU] via SUBCUTANEOUS
  Administered 2016-10-27: 5 [IU] via SUBCUTANEOUS
  Administered 2016-10-27: 8 [IU] via SUBCUTANEOUS
  Administered 2016-10-27: 11 [IU] via SUBCUTANEOUS
  Administered 2016-10-27: 8 [IU] via SUBCUTANEOUS
  Administered 2016-10-27 (×2): 5 [IU] via SUBCUTANEOUS
  Filled 2016-10-26 (×9): qty 1

## 2016-10-26 MED ORDER — CHLORHEXIDINE GLUCONATE 0.12 % MT SOLN
15.0000 mL | Freq: Two times a day (BID) | OROMUCOSAL | Status: DC
  Administered 2016-10-26: 15 mL via OROMUCOSAL
  Filled 2016-10-26: qty 15

## 2016-10-26 NOTE — Progress Notes (Signed)
[place] for electrolyte management   Pharmacy consulted for electrolyte management for 76 yo female admitted with acute respiratory distress and CHF exacerbation. Patient currently ordered furosemide 40mg  IV Q12hr.   Plan:   No replacement warranted at this time, however potassium is trending down. Will obtain follow-up BMP with am labs.    Allergies  Allergen Reactions  . Other Rash    Pt reports allergy to metals.    Patient Measurements: Height: 5\' 5"  (165.1 cm) Weight: 227 lb 1.2 oz (103 kg) IBW/kg (Calculated) : 57  Vital Signs: Temp: 98.1 F (36.7 C) (08/14 0800) Temp Source: Axillary (08/14 0800) BP: 144/87 (08/14 0600) Pulse Rate: 78 (08/14 0600) Intake/Output from previous day: 08/13 0701 - 08/14 0700 In: 80 [P.O.:30; IV Piggyback:50] Out: 2450 [Urine:2450] Intake/Output from this shift: No intake/output data recorded.  Labs:  Recent Labs  10/25/16 1302 10/26/16 0344  WBC 14.0* 8.2  HGB 9.9* 9.3*  HCT 33.6* 29.1*  PLT 355 295  CREATININE 1.61* 1.48*  ALBUMIN 3.7  --   PROT 7.1  --   AST 26  --   ALT 28  --   ALKPHOS 78  --   BILITOT 0.8  --    Estimated Creatinine Clearance: 39.7 mL/min (A) (by C-G formula based on SCr of 1.48 mg/dL (H)).    Pharmacy will continue to monitor and adjust per consult.   Aymara Sassi L 10/26/2016,10:45 AM

## 2016-10-26 NOTE — Progress Notes (Signed)
Inpatient Diabetes Program Recommendations  AACE/ADA: New Consensus Statement on Inpatient Glycemic Control (2015)  Target Ranges:  Prepandial:   less than 140 mg/dL      Peak postprandial:   less than 180 mg/dL (1-2 hours)      Critically ill patients:  140 - 180 mg/dL   Results for Mckenzie Taylor, Mckenzie Taylor (MRN [id number]) as of 10/26/2016 10:03  Ref. Range 10/25/2016 16:50 10/25/2016 19:16 10/25/2016 23:53 10/26/2016 07:33  Glucose-Capillary Latest Ref Range: 65 - 99 mg/dL 327 (H) 275 (H) 204 (H) 199 (H)    Admit with: SOB  History: DM, CHF, COPD, CKD  Home DM Meds: Levemir 18 units QHS       Novolog 2-10 units TID       Glipizide 2.5 mg daily   Current Insulin Orders: Novolog Sensitive Correction Scale/ SSI (0-9 units) Q4 hours       MD- Note patient receiving Solumedrol 40 mg BID.  Please consider the following:  1. Start Levemir 12 units daily (~75% total home dose)  2. Increase Novolog SSI to Moderate scale (0-15 units) Q4 hours     --Will follow patient during hospitalization--  Wyn Quaker RN, MSN, CDE Diabetes Coordinator Inpatient Glycemic Control Team Team Pager: [phone] (8a-5p)

## 2016-10-26 NOTE — Progress Notes (Signed)
Post breathing treatment and steroid dose, patient's lungs still slightly wheezing but much improved. Patient reports no difficulty breathing. Team will continue to monitor.

## 2016-10-26 NOTE — Progress Notes (Signed)
Patient continues to be SOB, placed back on bipap. Dr. Ashby Dawes notified. Patient changed back to step down status. Wilnette Kales

## 2016-10-26 NOTE — Progress Notes (Signed)
[facility] at [place] NAME: Mckenzie Taylor    MR#:  [id number]  DATE OF BIRTH:  03/06/1941  SUBJECTIVE:  CHIEF COMPLAINT:  Patient is still short of breath, on BiPAP  REVIEW OF SYSTEMS:  CONSTITUTIONAL: No fever, fatigue or weakness.  EYES: No blurred or double vision.  EARS, NOSE, AND THROAT: No tinnitus or ear pain.  RESPIRATORY: shortness of breath With minimal exertion, denies wheezing or hemoptysis.  CARDIOVASCULAR: No chest pain, orthopnea, edema.  GASTROINTESTINAL: No nausea, vomiting, diarrhea or abdominal pain.  GENITOURINARY: No dysuria, hematuria.  ENDOCRINE: No polyuria, nocturia,  HEMATOLOGY: No anemia, easy bruising or bleeding SKIN: No rash or lesion. MUSCULOSKELETAL: No joint pain or arthritis.   NEUROLOGIC: No tingling, numbness, weakness.  PSYCHIATRY: No anxiety or depression.   DRUG ALLERGIES:   Allergies  Allergen Reactions  . Other Rash    Pt reports allergy to metals.    VITALS:  Blood pressure 129/70, pulse (!) 101, temperature 98.1 F (36.7 C), temperature source Axillary, resp. rate 20, height 5\' 5"  (1.651 m), weight 103 kg (227 lb 1.2 oz), SpO2 92 %.  PHYSICAL EXAMINATION:  GENERAL:  76 y.o.-year-old patient lying in the bed with no acute distress.  EYES: Pupils equal, round, reactive to light and accommodation. No scleral icterus. Extraocular muscles intact.  HEENT: Head atraumatic, normocephalic. Oropharynx and nasopharynx clear.  NECK:  Supple, no jugular venous distention. No thyroid enlargement, no tenderness.  LUNGS: Moderate breath sounds bilaterally, minimal wheezing, no rales,rhonchi or crepitation. No use of accessory muscles of respiration.  CARDIOVASCULAR: S1, S2 normal. No murmurs, rubs, or gallops.  ABDOMEN: Soft, nontender, nondistended. Bowel sounds present. No organomegaly or mass.  EXTREMITIES: No pedal edema, cyanosis, or clubbing.  NEUROLOGIC: Cranial nerves II through XII  are intact. Muscle strength 5/5 in all extremities. Sensation intact. Gait not checked.  PSYCHIATRIC: The patient is alert and oriented x 3.  SKIN: No obvious rash, lesion, or ulcer.    LABORATORY PANEL:   CBC  Recent Labs Lab 10/26/16 0344  WBC 8.2  HGB 9.3*  HCT 29.1*  PLT 295   ------------------------------------------------------------------------------------------------------------------  Chemistries   Recent Labs Lab 10/25/16 1302 10/26/16 0344  NA 141 144  K 5.3* 4.0  CL 102 99*  CO2 31 36*  GLUCOSE 362* 180*  BUN 20 23*  CREATININE 1.61* 1.48*  CALCIUM 8.5* 8.8*  AST 26  --   ALT 28  --   ALKPHOS 78  --   BILITOT 0.8  --    ------------------------------------------------------------------------------------------------------------------  Cardiac Enzymes  Recent Labs Lab 10/25/16 1302  TROPONINI <0.03   ------------------------------------------------------------------------------------------------------------------  RADIOLOGY:  Dg Chest Portable 1 View  Result Date: 10/25/2016 CLINICAL DATA:  Shortness of breath. History of COPD, CHF, former smoker, coronary artery disease, diabetes. EXAM: PORTABLE CHEST 1 VIEW COMPARISON:  Portable chest x-ray of August 14, 2016 FINDINGS: The lungs are less well inflated today. There bilateral pleural effusions which appear new. The cardiac silhouette is enlarged. The pulmonary vascularity is engorged. Confluent alveolar opacities are present in the left mid and lower lung. There is calcification in the wall of the aortic arch. IMPRESSION: CHF with interstitial and alveolar edema and bilateral pleural effusions. Thoracic aortic atherosclerosis. Electronically Signed   By: David  Martinique M.D.   On: 10/25/2016 13:39    EKG:   Orders placed or performed during the hospital encounter of 10/25/16  . ED EKG  . ED EKG  . EKG 12-Lead  .  EKG 12-Lead    ASSESSMENT AND PLAN:     Mckenzie Taylor  is a 76 y.o. female with  a known history of Coronary artery disease, chronic systolic CHF, COPD, diabetes, hypertension, hyperlipidemia, GERD, chronic kidney disease stage III who presents to the hospital due to shortness of breath.  1. Acute on chronic respiratory failure with hypoxia-secondary to COPD exacerbation and also CHF. -Continue BiPAP prn. Follow up with pulmonology  -Continue IV steroids and taper if clinically improves , scheduled DuoNeb's, Pulmicort nebs. - IV Lasix for her CHF, follow I's and O's and daily weights. Wean off BiPAP as tolerated.   2. CHF-acute on chronic systolic CHF.  -we'll diurese the patient with IV Lasix, follow I's and O's and daily weights.  3. COPD-without acute exacerbation. Chest x-ray negative for any acute pneumonia. Treat patient with IV steroids, scheduled DuoNeb's, Pulmicort nebs. Follow clinically. Patient is on oxygen at home.  4. Diabetes-Place on sliding scale insulin, follow blood sugars.  5. Essential hypertension-continue Norvasc, metoprolol.  6. Hyperlipidemia-continue atorvastatin.  7. Diabetic neuropathy-continue gabapentin.  8. Depression-continue Prozac.   All the records are reviewed and case discussed with Care Management/Social Workerr. Management plans discussed with the patient, family and they are in agreement.  CODE STATUS:  FC   TOTAL TIME TAKING CARE OF THIS PATIENT: 36  minutes.   POSSIBLE D/C IN 2 DAYS, DEPENDING ON CLINICAL CONDITION.  Note: This dictation was prepared with Dragon dictation along with smaller phrase technology. Any transcriptional errors that result from this process are unintentional.   [name] M.D on 10/26/2016 at 3:59 PM  Between 7am to 6pm - Pager - [phone] After 6pm go to [email or web address] - password [email or web address] [facility]  [place] [facility]  Office  [phone]  CC: Primary care physician; Denton Lank, MD

## 2016-10-26 NOTE — Discharge Instructions (Signed)
Heart Failure Clinic appointment on November 04 2016 at 10:00am with Darylene Price, [place]. Please call [phone] to reschedule.

## 2016-10-26 NOTE — Progress Notes (Signed)
Patient reported feeling like she was having trouble "getting air in". RN went to assess and patient 93% on nasal cannula at 3L, RR in mid 20s. Lungs with bilateral loud wheezes. Reassured patient and family and notified MD, Dr. Mortimer Fries. MD ordered steroids 40 mg IV bid to start now and albuterol neb treatments q4 hours and discontinued duoneb treatments.

## 2016-10-27 LAB — GLUCOSE, CAPILLARY
Glucose-Capillary: 201 mg/dL — ABNORMAL HIGH (ref 65–99)
Glucose-Capillary: 210 mg/dL — ABNORMAL HIGH (ref 65–99)
Glucose-Capillary: 278 mg/dL — ABNORMAL HIGH (ref 65–99)
Glucose-Capillary: 291 mg/dL — ABNORMAL HIGH (ref 65–99)
Glucose-Capillary: 319 mg/dL — ABNORMAL HIGH (ref 65–99)

## 2016-10-27 LAB — BASIC METABOLIC PANEL
Anion gap: 10 (ref 5–15)
BUN: 31 mg/dL — ABNORMAL HIGH (ref 6–20)
CO2: 41 mmol/L — ABNORMAL HIGH (ref 22–32)
Calcium: 9.2 mg/dL (ref 8.9–10.3)
Chloride: 94 mmol/L — ABNORMAL LOW (ref 101–111)
Creatinine, Ser: 1.52 mg/dL — ABNORMAL HIGH (ref 0.44–1.00)
GFR calc Af Amer: 38 mL/min — ABNORMAL LOW (ref >60)
GFR calc non Af Amer: 33 mL/min — ABNORMAL LOW (ref >60)
Glucose, Bld: 196 mg/dL — ABNORMAL HIGH (ref 65–99)
Potassium: 4.2 mmol/L (ref 3.5–5.1)
Sodium: 145 mmol/L (ref 135–145)

## 2016-10-27 LAB — PROCALCITONIN: Procalcitonin: 0.17 ng/mL

## 2016-10-27 MED ORDER — INSULIN ASPART 100 UNIT/ML ~~LOC~~ SOLN
0.0000 [IU] | Freq: Three times a day (TID) | SUBCUTANEOUS | Status: DC
Start: 2016-10-28 — End: 2016-10-29
  Administered 2016-10-28: 5 [IU] via SUBCUTANEOUS
  Administered 2016-10-28: 8 [IU] via SUBCUTANEOUS
  Administered 2016-10-28 (×2): 3 [IU] via SUBCUTANEOUS
  Administered 2016-10-29: 5 [IU] via SUBCUTANEOUS
  Administered 2016-10-29: 2 [IU] via SUBCUTANEOUS
  Filled 2016-10-27 (×5): qty 1

## 2016-10-27 MED ORDER — TRAZODONE HCL 50 MG PO TABS
50.0000 mg | ORAL_TABLET | Freq: Every day | ORAL | Status: DC
  Administered 2016-10-27 – 2016-10-28 (×3): 50 mg via ORAL
  Filled 2016-10-27 (×3): qty 1

## 2016-10-27 MED ORDER — INSULIN DETEMIR 100 UNIT/ML ~~LOC~~ SOLN
12.0000 [IU] | Freq: Every day | SUBCUTANEOUS | Status: DC
  Administered 2016-10-27 – 2016-10-29 (×3): 12 [IU] via SUBCUTANEOUS
  Filled 2016-10-27 (×4): qty 0.12

## 2016-10-27 MED ORDER — METHYLPREDNISOLONE SODIUM SUCC 40 MG IJ SOLR
40.0000 mg | Freq: Every day | INTRAMUSCULAR | Status: DC
  Administered 2016-10-28: 40 mg via INTRAVENOUS
  Filled 2016-10-27: qty 1

## 2016-10-27 NOTE — Progress Notes (Signed)
Transferred to 1-A.  Report called to [facility], Therapist, sports.  Pt left floor via bed with all personal belongings.

## 2016-10-27 NOTE — Progress Notes (Signed)
Inpatient Diabetes Program Recommendations  AACE/ADA: New Consensus Statement on Inpatient Glycemic Control (2015)  Target Ranges:  Prepandial:   less than 140 mg/dL      Peak postprandial:   less than 180 mg/dL (1-2 hours)      Critically ill patients:  140 - 180 mg/dL   Lab Results  Component Value Date   GLUCAP 210 (H) 10/27/2016   HGBA1C 6.7 (H) 03/26/2016    Review of Glycemic ControlResults for FRANCISCO, EYERLY (MRN [id number]) as of 10/27/2016 10:57  Ref. Range 10/26/2016 16:01 10/26/2016 19:38 10/26/2016 23:50 10/27/2016 04:01 10/27/2016 07:22  Glucose-Capillary Latest Ref Range: 65 - 99 mg/dL 285 (H) 229 (H) 207 (H) 201 (H) 210 (H)   Diabetes history: DM Outpatient Diabetes medications:                              Levemir 18 units QHS                             Novolog 2-10 units TID                             Glipizide 2.5 mg daily Current orders for Inpatient glycemic control:  Novolog sensitive correction q 4 hours, Solumderol 40 mg IV q 12 hours  Inpatient Diabetes Program Recommendations:    Please consider restarting a portion of patient's home dose of Levemir.  Consider adding Levemir 12 units daily.  Also consider increasing Novolog correction to moderate (0-15 units) q 4 hours.  Thanks, Adah Perl, RN, BC-ADM Inpatient Diabetes Coordinator Pager [phone] (8a-5p)

## 2016-10-27 NOTE — Progress Notes (Signed)
[facility] at [place] NAME: Mckenzie Taylor    MR#:  [id number]  DATE OF BIRTH:  Oct 01, 1940  SUBJECTIVE:  CHIEF COMPLAINT:  Patient's sob is better, off bipap  REVIEW OF SYSTEMS:  CONSTITUTIONAL: No fever, fatigue or weakness.  EYES: No blurred or double vision.  EARS, NOSE, AND THROAT: No tinnitus or ear pain.  RESPIRATORY: shortness of breath better, denies wheezing or hemoptysis.  CARDIOVASCULAR: No chest pain, orthopnea, edema.  GASTROINTESTINAL: No nausea, vomiting, diarrhea or abdominal pain.  GENITOURINARY: No dysuria, hematuria.  ENDOCRINE: No polyuria, nocturia,  HEMATOLOGY: No anemia, easy bruising or bleeding SKIN: No rash or lesion. MUSCULOSKELETAL: No joint pain or arthritis.   NEUROLOGIC: No tingling, numbness, weakness.  PSYCHIATRY: No anxiety or depression.   DRUG ALLERGIES:   Allergies  Allergen Reactions  . Other Rash    Pt reports allergy to metals.    VITALS:  Blood pressure (!) 168/95, pulse 94, temperature 98.3 F (36.8 C), temperature source Oral, resp. rate (!) 24, height 5\' 5"  (1.651 m), weight 103 kg (227 lb 1.2 oz), SpO2 94 %.  PHYSICAL EXAMINATION:  GENERAL:  76 y.o.-year-old patient lying in the bed with no acute distress.  EYES: Pupils equal, round, reactive to light and accommodation. No scleral icterus. Extraocular muscles intact.  HEENT: Head atraumatic, normocephalic. Oropharynx and nasopharynx clear.  NECK:  Supple, no jugular venous distention. No thyroid enlargement, no tenderness.  LUNGS: Moderate breath sounds bilaterally, minimal wheezing, no rales,rhonchi or crepitation. No use of accessory muscles of respiration.  CARDIOVASCULAR: S1, S2 normal. No murmurs, rubs, or gallops.  ABDOMEN: Soft, nontender, nondistended. Bowel sounds present. No organomegaly or mass.  EXTREMITIES: No pedal edema, cyanosis, or clubbing.  NEUROLOGIC: Cranial nerves II through XII are intact. Muscle  strength 5/5 in all extremities. Sensation intact. Gait not checked.  PSYCHIATRIC: The patient is alert and oriented x 3.  SKIN: No obvious rash, lesion, or ulcer.    LABORATORY PANEL:   CBC  Recent Labs Lab 10/26/16 0344  WBC 8.2  HGB 9.3*  HCT 29.1*  PLT 295   ------------------------------------------------------------------------------------------------------------------  Chemistries   Recent Labs Lab 10/25/16 1302  10/27/16 0324  NA 141  < > 145  K 5.3*  < > 4.2  CL 102  < > 94*  CO2 31  < > 41*  GLUCOSE 362*  < > 196*  BUN 20  < > 31*  CREATININE 1.61*  < > 1.52*  CALCIUM 8.5*  < > 9.2  AST 26  --   --   ALT 28  --   --   ALKPHOS 78  --   --   BILITOT 0.8  --   --   < > = values in this interval not displayed. ------------------------------------------------------------------------------------------------------------------  Cardiac Enzymes  Recent Labs Lab 10/25/16 1302  TROPONINI <0.03   ------------------------------------------------------------------------------------------------------------------  RADIOLOGY:  No results found.  EKG:   Orders placed or performed during the hospital encounter of 10/25/16  . ED EKG  . ED EKG  . EKG 12-Lead  . EKG 12-Lead    ASSESSMENT AND PLAN:     Mckenzie Taylor  is a 76 y.o. female with a known history of Coronary artery disease, chronic systolic CHF, COPD, diabetes, hypertension, hyperlipidemia, GERD, chronic kidney disease stage III who presents to the hospital due to shortness of breath.  1. Acute on chronic respiratory failure with hypoxia-secondary to COPD exacerbation and also CHF. -clinically improving, possible  transfer to floor today -Continue BiPAP prn. Follow up with pulmonology  -Continue IV steroids and taper  , scheduled DuoNeb's, Pulmicort nebs. - IV Lasix for her CHF, follow I's and O's and daily weights. Wean off BiPAP as tolerated.   2. CHF-acute on chronic systolic CHF.  -we'll  diurese the patient with IV Lasix, follow I's and O's and daily weights.  3. COPD-without acute exacerbation. Chest x-ray negative for any acute pneumonia. Treat patient with IV steroids, scheduled DuoNeb's, Pulmicort nebs. Follow clinically. Patient is on oxygen at home.  4. Diabetes-Place on sliding scale insulin, follow blood sugars.Levemir 12 units is added to the regimen  5. Essential hypertension-continue Norvasc, metoprolol.  6. Hyperlipidemia-continue atorvastatin.  7. Diabetic neuropathy-continue gabapentin.  8. Depression-continue Prozac.   All the records are reviewed and case discussed with Care Management/Social Workerr. Management plans discussed with the patient, family and they are in agreement.  CODE STATUS:  FC   TOTAL TIME TAKING CARE OF THIS PATIENT: 35  minutes.   POSSIBLE D/C IN 2 DAYS, DEPENDING ON CLINICAL CONDITION.  Note: This dictation was prepared with Dragon dictation along with smaller phrase technology. Any transcriptional errors that result from this process are unintentional.   [name] M.D on 10/27/2016 at 3:24 PM  Between 7am to 6pm - Pager - [phone] After 6pm go to [email or web address] - password [email or web address] [facility]  [facility]  Office  [phone]  CC: Primary care physician; Denton Lank, MD

## 2016-10-27 NOTE — Progress Notes (Signed)
[facility] for electrolyte management   Pharmacy consulted for electrolyte management for 76 yo female admitted with acute respiratory distress and CHF exacerbation. Patient currently ordered furosemide 40mg  IV Q12hr.   Plan:   No replacement warranted at this time. Will obtain follow-up BMP with am labs.    Allergies  Allergen Reactions  . Other Rash    Pt reports allergy to metals.    Patient Measurements: Height: 5\' 5"  (165.1 cm) Weight: 227 lb 1.2 oz (103 kg) IBW/kg (Calculated) : 57  Vital Signs: Temp: 98.7 F (37.1 C) (08/15 1600) Temp Source: Oral (08/15 1600) BP: 163/94 (08/15 1600) Pulse Rate: 112 (08/15 1600) Intake/Output from previous day: 08/14 0701 - 08/15 0700 In: 245 [P.O.:245] Out: 3200 [Urine:3200] Intake/Output from this shift: Total I/O In: -  Out: 2000 [Urine:2000]  Labs:  Recent Labs  10/25/16 1302 10/26/16 0344 10/27/16 0324  WBC 14.0* 8.2  --   HGB 9.9* 9.3*  --   HCT 33.6* 29.1*  --   PLT 355 295  --   CREATININE 1.61* 1.48* 1.52*  ALBUMIN 3.7  --   --   PROT 7.1  --   --   AST 26  --   --   ALT 28  --   --   ALKPHOS 78  --   --   BILITOT 0.8  --   --    Estimated Creatinine Clearance: 38.7 mL/min (A) (by C-G formula based on SCr of 1.52 mg/dL (H)).    Pharmacy will continue to monitor and adjust per consult.   Gemayel Mascio L 10/27/2016,6:37 PM

## 2016-10-27 NOTE — Progress Notes (Signed)
Name: Mckenzie Taylor MRN: [id number] DOB: 09-May-1940    ADMISSION DATE:  10/25/2016 CONSULTATION DATE: 10/25/2016  REFERRING MD : Dr. Verdell Carmine  CHIEF COMPLAINT: Shortness of Breath   BRIEF PATIENT DESCRIPTION:  76 year old female admitted 08/13 with acute on chronic respiratory failure with hypoxia secondary to COPD and CHF exacerbation requiring continuous BiPAP.  SIGNIFICANT EVENTS  08/13-Pt admitted to [facility] Unit   STUDIES:  None   HISTORY OF PRESENT ILLNESS:   This is a 76 year old female with a past medical history of CAD, chronic systolic CHF, COPD, diabetes, hypertension, hyperlipidemia, GERD, hypothyroidism, and CKD stage III.  She presented to [facility] ER 08/13 with complaints of shortness of breath onset of symptoms 1 week ago, however the symptoms worsened prompting current ER visit.  The pt notified EMS due to symptoms and upon EMS arrival she was short of breath on her home O2 at 2L with O2 sats in the 80's, therefore she received an albuterol treatment with minimal improvement of symptoms. She is also concerned about possibly having cellulitis in the right lower extremity due to redness and warmth.  She had similar symptoms in the left lower extremity 2 weeks ago and was diagnosed with cellulitis by her endocrinologist, therefore she was prescribed and completed a course of antibiotics.  Upon arrival to the ER she was on a nonrebreather mask with O2 sats in the mid 90s, however due to persistent respiratory distress and somnolence she was placed on continuous BiPAP. She was subsequently admitted by the hospitalist team to the stepdown unit for further workup and treatment PCCM consulted.  Prior to Admission medications   Medication Sig Start Date End Date Taking? Authorizing Provider  albuterol (PROVENTIL) (2.5 MG/3ML) 0.083% nebulizer solution Take by nebulization every 4 (four) hours as needed for wheezing or shortness of breath.   Yes [provider]  albuterol  (VENTOLIN HFA) 108 (90 BASE) MCG/ACT inhaler Inhale 2 puffs into the lungs every 4 (four) hours as needed.    Yes [provider]  amLODipine (NORVASC) 10 MG tablet Take 10 mg by mouth daily.     Yes [provider]  atorvastatin (LIPITOR) 40 MG tablet Take 40 mg by mouth daily.   Yes [provider]  cephALEXin (KEFLEX) 500 MG capsule Take 500 mg by mouth 3 (three) times daily.   Yes [provider]  Cholecalciferol (VITAMIN D3) 5000 units CAPS Take 1 capsule by mouth daily.   Yes [provider]  Cyanocobalamin 1500 MCG TBDP Take 3 tablets by mouth daily.   Yes [provider]  FLUoxetine (PROZAC) 40 MG capsule Take 40 mg by mouth.   Yes [provider]  furosemide (LASIX) 40 MG tablet Take 40 mg by mouth daily. 04/06/16  Yes [provider]  gabapentin (NEURONTIN) 100 MG capsule Take 200 mg by mouth 3 (three) times daily.   Yes [provider]  glipiZIDE (GLUCOTROL XL) 2.5 MG 24 hr tablet Take 2.5 mg by mouth daily.    Yes [provider]  ibuprofen (ADVIL,MOTRIN) 800 MG tablet Take 800 mg by mouth every 8 (eight) hours as needed for mild pain.    Yes [provider]  ipratropium-albuterol (DUONEB) 0.5-2.5 (3) MG/3ML SOLN Take 3 mLs by nebulization 4 (four) times daily. 03/28/16  Yes Vaughan Basta, MD  LEVEMIR FLEXTOUCH 100 UNIT/ML Pen Inject 18 Units into the skin at bedtime.  07/23/16  Yes [provider]  metoprolol tartrate (LOPRESSOR) 25 MG tablet Take 1 tablet (  25 mg total) by mouth 2 (two) times daily. 08/19/16  Yes Vaughan Basta, MD  NOVOLOG FLEXPEN 100 UNIT/ML FlexPen Inject 2-10 Units into the skin 3 (three) times daily as needed. Sliding scale if blood sugar is above 200. 04/02/16  Yes [provider]  omeprazole (PRILOSEC) 20 MG capsule Take 20 mg by mouth daily.   Yes [provider]  oxyCODONE-acetaminophen (PERCOCET) 10-325 MG tablet Take 1 tablet  by mouth every 8 (eight) hours as needed for pain.  04/21/16  Yes [provider]  promethazine (PHENERGAN) 25 MG tablet Take 1 tablet by mouth daily. 08/16/14  Yes [provider]  traZODone (DESYREL) 50 MG tablet Take 50 mg by mouth at bedtime. 08/10/16  Yes [provider]  umeclidinium bromide (INCRUSE ELLIPTA) 62.5 MCG/INH AEPB Inhale 1 puff into the lungs daily.   Yes [provider]  predniSONE (STERAPRED UNI-PAK 21 TAB) 10 MG (21) TBPK tablet Take 6 tabs first day, 5 tab on day 2, then 4 on day 3rd, 3 tabs on day 4th , 2 tab on day 5th, and 1 tab on 6th day. Patient not taking: Reported on 10/25/2016 08/19/16   Vaughan Basta, MD  tiZANidine (ZANAFLEX) 4 MG capsule Take 4 mg by mouth 3 (three) times daily as needed for muscle spasms.    [provider]   Allergies  Allergen Reactions  . Other Rash    Pt reports allergy to metals.   SUBJECTIVE: Patient was on on 2-3 liters of nasal canula.  Did not tolerate BiPAP overnight.  Had an uneventful night VITAL SIGNS: Temp:  [98.1 F (36.7 C)-99.1 F (37.3 C)] 99.1 F (37.3 C) (08/14 2000) Pulse Rate:  [78-113] 98 (08/15 0300) Resp:  [14-28] 21 (08/15 0300) BP: (129-169)/(65-114) 169/98 (08/15 0300) SpO2:  [88 %-97 %] 96 % (08/15 0306) FiO2 (%):  [35 %] 35 % (08/14 0746)  PHYSICAL EXAMINATION: General: Well-developed, well-nourished Caucasian female, NAD Neuro: Alert and oriented, follows commands HEENT: Supple, no JVD Cardiovascular: S1-S2, RRR, no M/R/G Lungs: diminished bibasilar, no wheezes,crackles,rhonchi Abdomen: Positive bowel sounds 4, obese, soft, nontender, nondistended Musculoskeletal: No edema, moves all extremities Skin: Bilateral lower extremity redness and warmth   Recent Labs Lab 10/25/16 1302 10/26/16 0344  NA 141 144  K 5.3* 4.0  CL 102 99*  CO2 31 36*  BUN 20 23*  CREATININE 1.61* 1.48*  GLUCOSE 362* 180*    Recent Labs Lab 10/25/16 1302  10/26/16 0344  HGB 9.9* 9.3*  HCT 33.6* 29.1*  WBC 14.0* 8.2  PLT 355 295   Dg Chest Portable 1 View  Result Date: 10/25/2016 CLINICAL DATA:  Shortness of breath. History of COPD, CHF, former smoker, coronary artery disease, diabetes. EXAM: PORTABLE CHEST 1 VIEW COMPARISON:  Portable chest x-ray of August 14, 2016 FINDINGS: The lungs are less well inflated today. There bilateral pleural effusions which appear new. The cardiac silhouette is enlarged. The pulmonary vascularity is engorged. Confluent alveolar opacities are present in the left mid and lower lung. There is calcification in the wall of the aortic arch. IMPRESSION: CHF with interstitial and alveolar edema and bilateral pleural effusions. Thoracic aortic atherosclerosis. Electronically Signed   By: David  Martinique M.D.   On: 10/25/2016 13:39    ASSESSMENT / PLAN: Acute on chronic hypoxic respiratory failure secondary to COPD and CHF exacerbation- Resolving  Acute on chronic renal failure- Improving Anemia of chronic disease Hx: Diabetes Mellitus, CAD, Hypothyroidism, Hyperlipidemia, and GERD  P: BiPAP PRN Maintain O2  sats 88% to 92% Continue Bronchodilator therapy Continue steroids, taper Continue diuresis Trend WBC and monitor fever curve Trend BMP Replace electrolytes as indicated Trend CBC Monitor for s/sx of bleeding Blood glucose checks with SSI coverage    Bincy Varughese,AG-ACNP Pulmonary & Critical Care

## 2016-10-28 LAB — BASIC METABOLIC PANEL
Anion gap: 13 (ref 5–15)
BUN: 34 mg/dL — ABNORMAL HIGH (ref 6–20)
CO2: 39 mmol/L — ABNORMAL HIGH (ref 22–32)
Calcium: 8.5 mg/dL — ABNORMAL LOW (ref 8.9–10.3)
Chloride: 90 mmol/L — ABNORMAL LOW (ref 101–111)
Creatinine, Ser: 1.53 mg/dL — ABNORMAL HIGH (ref 0.44–1.00)
GFR calc Af Amer: 38 mL/min — ABNORMAL LOW (ref >60)
GFR calc non Af Amer: 32 mL/min — ABNORMAL LOW (ref >60)
Glucose, Bld: 193 mg/dL — ABNORMAL HIGH (ref 65–99)
Potassium: 3.5 mmol/L (ref 3.5–5.1)
Sodium: 142 mmol/L (ref 135–145)

## 2016-10-28 LAB — GLUCOSE, CAPILLARY
Glucose-Capillary: 199 mg/dL — ABNORMAL HIGH (ref 65–99)
Glucose-Capillary: 181 mg/dL — ABNORMAL HIGH (ref 65–99)
Glucose-Capillary: 221 mg/dL — ABNORMAL HIGH (ref 65–99)
Glucose-Capillary: 298 mg/dL — ABNORMAL HIGH (ref 65–99)
Glucose-Capillary: 185 mg/dL — ABNORMAL HIGH (ref 65–99)

## 2016-10-28 MED ORDER — PREDNISONE 50 MG PO TABS
50.0000 mg | ORAL_TABLET | Freq: Every day | ORAL | Status: DC
  Administered 2016-10-29: 50 mg via ORAL
  Filled 2016-10-28: qty 1

## 2016-10-28 MED ORDER — PANTOPRAZOLE SODIUM 40 MG PO TBEC
40.0000 mg | DELAYED_RELEASE_TABLET | Freq: Every day | ORAL | Status: DC
  Administered 2016-10-29: 40 mg via ORAL
  Filled 2016-10-28: qty 1

## 2016-10-28 MED ORDER — GABAPENTIN 100 MG PO CAPS
200.0000 mg | ORAL_CAPSULE | Freq: Three times a day (TID) | ORAL | Status: DC
  Administered 2016-10-28 – 2016-10-29 (×3): 200 mg via ORAL
  Filled 2016-10-28 (×3): qty 2

## 2016-10-28 MED ORDER — AMLODIPINE BESYLATE 10 MG PO TABS
10.0000 mg | ORAL_TABLET | Freq: Every day | ORAL | Status: DC
  Administered 2016-10-29: 10 mg via ORAL
  Filled 2016-10-28: qty 1

## 2016-10-28 MED ORDER — FUROSEMIDE 40 MG PO TABS: 40.0000 mg | ORAL_TABLET | Freq: Two times a day (BID) | ORAL | Status: DC

## 2016-10-28 MED ORDER — POTASSIUM CHLORIDE CRYS ER 20 MEQ PO TBCR
20.0000 meq | EXTENDED_RELEASE_TABLET | Freq: Once | ORAL | Status: AC
  Administered 2016-10-28: 20 meq via ORAL

## 2016-10-28 MED ORDER — FLUOXETINE HCL 20 MG PO CAPS
40.0000 mg | ORAL_CAPSULE | Freq: Every day | ORAL | Status: DC
  Administered 2016-10-29: 40 mg via ORAL
  Filled 2016-10-28 (×2): qty 2

## 2016-10-28 MED ORDER — BUDESONIDE 0.25 MG/2ML IN SUSP
0.2500 mg | Freq: Two times a day (BID) | RESPIRATORY_TRACT | Status: DC
  Administered 2016-10-28 – 2016-10-29 (×2): 0.25 mg via RESPIRATORY_TRACT
  Filled 2016-10-28 (×2): qty 2

## 2016-10-28 MED ORDER — METOPROLOL TARTRATE 25 MG PO TABS
25.0000 mg | ORAL_TABLET | Freq: Two times a day (BID) | ORAL | Status: DC
  Administered 2016-10-28 – 2016-10-29 (×3): 25 mg via ORAL
  Filled 2016-10-28 (×3): qty 1

## 2016-10-28 MED ORDER — POTASSIUM CHLORIDE CRYS ER 20 MEQ PO TBCR
EXTENDED_RELEASE_TABLET | ORAL | Status: AC
  Filled 2016-10-28: qty 2

## 2016-10-28 MED ORDER — BUDESONIDE 0.25 MG/2ML IN SUSP: 0.2500 mg | Freq: Two times a day (BID) | RESPIRATORY_TRACT | Status: DC

## 2016-10-28 MED ORDER — ATORVASTATIN CALCIUM 20 MG PO TABS: 40.0000 mg | ORAL_TABLET | Freq: Every day | ORAL | Status: DC

## 2016-10-28 MED ORDER — IPRATROPIUM-ALBUTEROL 0.5-2.5 (3) MG/3ML IN SOLN: 3.0000 mL | Freq: Four times a day (QID) | RESPIRATORY_TRACT | Status: DC

## 2016-10-28 NOTE — Progress Notes (Signed)
PT Cancellation Note  Patient Details Name: SANAIYA WELLIVER MRN: [id number] DOB: 1940-09-18   Cancelled Treatment:    Reason Eval/Treat Not Completed: Other (comment).  PT consult received.  Chart reviewed.  Pt currently receiving breathing treatment (respiratory therapist in room).  Will re-attempt PT eval at a later date/time.  Leitha Bleak, PT 10/28/16, 4:00 PM [phone]

## 2016-10-28 NOTE — Progress Notes (Signed)
[facility] at [place] NAME: Mckenzie Taylor    MR#:  [id number]  DATE OF BIRTH:  04/27/1940  SUBJECTIVE:  CHIEF COMPLAINT:  Patient's sob is Improving, off bipap Feeling better  REVIEW OF SYSTEMS:  CONSTITUTIONAL: No fever, fatigue or weakness.  EYES: No blurred or double vision.  EARS, NOSE, AND THROAT: No tinnitus or ear pain.  RESPIRATORY: shortness of breath better, denies wheezing or hemoptysis.  CARDIOVASCULAR: No chest pain, orthopnea, edema.  GASTROINTESTINAL: No nausea, vomiting, diarrhea or abdominal pain.  GENITOURINARY: No dysuria, hematuria.  ENDOCRINE: No polyuria, nocturia,  HEMATOLOGY: No anemia, easy bruising or bleeding SKIN: No rash or lesion. MUSCULOSKELETAL: No joint pain or arthritis.   NEUROLOGIC: No tingling, numbness, weakness.  PSYCHIATRY: No anxiety or depression.   DRUG ALLERGIES:   Allergies  Allergen Reactions  . Other Rash    Pt reports allergy to metals.    VITALS:  Blood pressure 114/60, pulse (!) 107, temperature 98.2 F (36.8 C), temperature source Oral, resp. rate 16, height 5\' 5"  (1.651 m), weight 103 kg (227 lb 1.2 oz), SpO2 93 %.  PHYSICAL EXAMINATION:  GENERAL:  76 y.o.-year-old patient lying in the bed with no acute distress.  EYES: Pupils equal, round, reactive to light and accommodation. No scleral icterus. Extraocular muscles intact.  HEENT: Head atraumatic, normocephalic. Oropharynx and nasopharynx clear.  NECK:  Supple, no jugular venous distention. No thyroid enlargement, no tenderness.  LUNGS: Moderate breath sounds bilaterally, minimal wheezing, no rales,rhonchi or crepitation. No use of accessory muscles of respiration.  CARDIOVASCULAR: S1, S2 normal. No murmurs, rubs, or gallops.  ABDOMEN: Soft, nontender, nondistended. Bowel sounds present. No organomegaly or mass.  EXTREMITIES: No pedal edema, cyanosis, or clubbing.  NEUROLOGIC: Cranial nerves II through XII are  intact. Muscle strength 5/5 in all extremities. Sensation intact. Gait not checked.  PSYCHIATRIC: The patient is alert and oriented x 3.  SKIN: No obvious rash, lesion, or ulcer.    LABORATORY PANEL:   CBC  Recent Labs Lab 10/26/16 0344  WBC 8.2  HGB 9.3*  HCT 29.1*  PLT 295   ------------------------------------------------------------------------------------------------------------------  Chemistries   Recent Labs Lab 10/25/16 1302  10/28/16 0329  NA 141  < > 142  K 5.3*  < > 3.5  CL 102  < > 90*  CO2 31  < > 39*  GLUCOSE 362*  < > 193*  BUN 20  < > 34*  CREATININE 1.61*  < > 1.53*  CALCIUM 8.5*  < > 8.5*  AST 26  --   --   ALT 28  --   --   ALKPHOS 78  --   --   BILITOT 0.8  --   --   < > = values in this interval not displayed. ------------------------------------------------------------------------------------------------------------------  Cardiac Enzymes  Recent Labs Lab 10/25/16 1302  TROPONINI <0.03   ------------------------------------------------------------------------------------------------------------------  RADIOLOGY:  No results found.  EKG:   Orders placed or performed during the hospital encounter of 10/25/16  . ED EKG  . ED EKG  . EKG 12-Lead  . EKG 12-Lead    ASSESSMENT AND PLAN:     Lisa Milian  is a 76 y.o. female with a known history of Coronary artery disease, chronic systolic CHF, COPD, diabetes, hypertension, hyperlipidemia, GERD, chronic kidney disease stage III who presents to the hospital due to shortness of breath.  1. Acute on chronic respiratory failure with hypoxia-secondary to COPD exacerbation and also CHF. -clinically improving,  Transfer to floor from stepdown unit on August 15 -Continue BiPAP prn. Follow up with pulmonology  -Continue IV steroids and taper  , scheduled DuoNeb's, Pulmicort nebs. - IV Lasix for her CHF will be changed to by mouth, follow I's and O's and daily weights. Wean off BiPAP as  tolerated.   2. CHF-acute on chronic systolic CHF.  -we'll diurese the patient with  Lasix, follow I's and O's and daily weights.  3. COPD-without acute exacerbation. Chest x-ray negative for any acute pneumonia. Treat patient with IV steroids, scheduled DuoNeb's, Pulmicort nebs. Follow clinically. Patient is on oxygen at home.  4. Diabetes-Place on sliding scale insulin, follow blood sugars.Levemir 12 units is added to the regimen  5. Essential hypertension-continue Norvasc, metoprolol.  6. Hyperlipidemia-continue atorvastatin.  7. Diabetic neuropathy-continue gabapentin.  8. Depression-continue Prozac.  Consult physical therapy All the records are reviewed and case discussed with Care Management/Social Workerr. Management plans discussed with the patient, family and they are in agreement.  CODE STATUS:  FC   TOTAL TIME TAKING CARE OF THIS PATIENT: 35  minutes.   POSSIBLE D/C IN 2 DAYS, DEPENDING ON CLINICAL CONDITION.  Note: This dictation was prepared with Dragon dictation along with smaller phrase technology. Any transcriptional errors that result from this process are unintentional.   [name] M.D on 10/28/2016 at 3:49 PM  Between 7am to 6pm - Pager - [phone] After 6pm go to [email or web address] - password [email or web address] [facility]  [facility]  Office  [phone]  CC: Primary care physician; Denton Lank, MD

## 2016-10-28 NOTE — Progress Notes (Signed)
[place] for electrolyte management   Pharmacy consulted for electrolyte management for 76 yo female admitted with acute respiratory distress and CHF exacerbation. Patient currently ordered furosemide 40mg  IV Q12hr.   Plan:   No replacement warranted at this time. Will obtain follow-up BMP with am labs.   8/16 0329 K 3.5, will give potassium chloride 20 mEq PO x 1 and recheck electrolytes tomorrow with AM labs.    Allergies  Allergen Reactions  . Other Rash    Pt reports allergy to metals.    Patient Measurements: Height: 5\' 5"  (165.1 cm) Weight: 227 lb 1.2 oz (103 kg) IBW/kg (Calculated) : 57  Vital Signs: Temp: 99.7 F (37.6 C) (08/15 2141) Temp Source: Oral (08/15 2141) BP: 147/74 (08/15 2141) Pulse Rate: 100 (08/15 2141) Intake/Output from previous day: 08/15 0701 - 08/16 0700 In: -  Out: 2000 [Urine:2000] Intake/Output from this shift: No intake/output data recorded.  Labs:  Recent Labs  10/25/16 1302 10/26/16 0344 10/27/16 0324 10/28/16 0329  WBC 14.0* 8.2  --   --   HGB 9.9* 9.3*  --   --   HCT 33.6* 29.1*  --   --   PLT 355 295  --   --   CREATININE 1.61* 1.48* 1.52* 1.53*  ALBUMIN 3.7  --   --   --   PROT 7.1  --   --   --   AST 26  --   --   --   ALT 28  --   --   --   ALKPHOS 78  --   --   --   BILITOT 0.8  --   --   --    Estimated Creatinine Clearance: 38.4 mL/min (A) (by C-G formula based on SCr of 1.53 mg/dL (H)).    Pharmacy will continue to monitor and adjust per consult.   Laural Benes, Pharm.D., BCPS Clinical Pharmacist 10/28/2016,7:22 AM

## 2016-10-29 DIAGNOSIS — R112 Nausea with vomiting, unspecified: Secondary | ICD-10-CM

## 2016-10-29 LAB — BASIC METABOLIC PANEL
Anion gap: 11 (ref 5–15)
BUN: 40 mg/dL — ABNORMAL HIGH (ref 6–20)
CO2: 40 mmol/L — ABNORMAL HIGH (ref 22–32)
Calcium: 8.6 mg/dL — ABNORMAL LOW (ref 8.9–10.3)
Chloride: 93 mmol/L — ABNORMAL LOW (ref 101–111)
Creatinine, Ser: 1.82 mg/dL — ABNORMAL HIGH (ref 0.44–1.00)
GFR calc Af Amer: 30 mL/min — ABNORMAL LOW (ref >60)
GFR calc non Af Amer: 26 mL/min — ABNORMAL LOW (ref >60)
Glucose, Bld: 150 mg/dL — ABNORMAL HIGH (ref 65–99)
Potassium: 3.7 mmol/L (ref 3.5–5.1)
Sodium: 144 mmol/L (ref 135–145)

## 2016-10-29 LAB — MAGNESIUM: Magnesium: 2.3 mg/dL (ref 1.7–2.4)

## 2016-10-29 LAB — PHOSPHORUS: Phosphorus: 5.2 mg/dL — ABNORMAL HIGH (ref 2.5–4.6)

## 2016-10-29 LAB — GLUCOSE, CAPILLARY
Glucose-Capillary: 123 mg/dL — ABNORMAL HIGH (ref 65–99)
Glucose-Capillary: 243 mg/dL — ABNORMAL HIGH (ref 65–99)

## 2016-10-29 MED ORDER — PREDNISONE 10 MG (21) PO TBPK: ORAL_TABLET | ORAL | 0 refills | Status: DC

## 2016-10-29 MED ORDER — AZITHROMYCIN 250 MG PO TABS: 250.0000 mg | ORAL_TABLET | Freq: Every day | ORAL | 0 refills | Status: DC

## 2016-10-29 MED ORDER — SUCRALFATE 1 G PO TABS: 1.0000 g | ORAL_TABLET | Freq: Four times a day (QID) | ORAL | 1 refills | Status: DC

## 2016-10-29 MED ORDER — SUCRALFATE 1 GM/10ML PO SUSP
1.0000 g | Freq: Three times a day (TID) | ORAL | Status: DC
  Administered 2016-10-29 (×2): 1 g via ORAL
  Filled 2016-10-29 (×4): qty 10

## 2016-10-29 NOTE — Discharge Summary (Signed)
[facility] [place] NAME: Mckenzie Taylor    MR#:  [id number]  DATE OF BIRTH:  Jul 27, 1940  DATE OF ADMISSION:  10/25/2016 ADMITTING PHYSICIAN: Henreitta Leber, MD  DATE OF DISCHARGE: No discharge date for patient encounter.  PRIMARY CARE PHYSICIAN: Denton Lank, MD     ADMISSION DIAGNOSIS:  Acute pulmonary edema ([place]) [J81.0] Hypoxia [R09.02] Acute on chronic congestive heart failure, unspecified heart failure type ([place]) [I50.9]  DISCHARGE DIAGNOSIS:  Active Problems:   Acute respiratory failure with hypoxia (HCC)   Acute on chronic congestive heart failure (HCC)   Acute pulmonary edema (HCC)   Nausea and vomiting   SECONDARY DIAGNOSIS:   Past Medical History:  Diagnosis Date  . Autosomal recessive polycystic kidneys   . Avascular necrosis of hip (HCC)    bilateral  . CAD (coronary artery disease)    mild  . CHF (congestive heart failure) ([place])   . Chronic kidney disease    cyst  . COPD (chronic obstructive pulmonary disease) ([place])   . Diabetes type 2, controlled ([place])   . GERD (gastroesophageal reflux disease)   . HTN (hypertension)   . Hypothyroidism    subclinical. low TSH, normal thyroid panel. biopsy 2010  . Vitamin B12 deficiency     .pro HOSPITAL COURSE:   Patient is 76 year old Caucasian female with a history significant history of coronary artery disease, congestive heart failure, cardiomyopathy with ejection fraction of 45-50%, chronic kidney disease, COPD, diabetes, hypertension, but hyperlipidemia, obesity, presents to the hospital with complaints of shortness of breath, getting progressively worse over the past 2 or 3 days. On EMS arrival, patient's O2 sats were found to be in 80s, she was brought to emergency room where she was placed on BiPAP. A chest x-ray revealed pulmonary edema and congestive heart failure. She also was noted to be wheezing and therefore she was given Solu Medrol and doing  nebs and admitted to the hospital. Patient's labs revealed mild renal insufficiency described. No 1.48, elevated CO2 level of 36, normal troponin, negative pro-calcitonin, no leukocytosis, mild anemia. Patient admitted to the hospital, initiated on Lasix intravenously, diuresed approximately 6.6 L during her hospitalization time with improvement of oxygenation and well-being. Oxygen saturations were 97% areas of oxygen through nasal cannulas on the day of discharge, patient had some nausea and vomiting intermittently while in the hospital, she was initiated on Carafate, continued on Proton pump inhibitor and improved clinically.  by the day of discharge she was able to eat regular diet and was felt to be stable to be discharged home.   Discussion by problem:  1. Acute on chronic respiratory failure with hypoxia-secondary to COPD exacerbation and acute on chronic systolic CHF. -clinically improved.  Transfer to floor from stepdown unit on August 15, patient was weaned off BiPAP, now on her usual 3 L of oxygen through nasal cannula with good oxygen saturations. She was advised to continue prednisone taper, DuoNeb nebs, inhaled steroids, Lasix. She was advised to follow her weight closely and follow up with her primary care physician for advancement of her diuretics if needed. She was over diuresed with worsening creatinine to 1.82 on the day of discharge, for this reason ACE inhibitor was not initiated on discharge, recommended as outpatient, provided her kidney function improves.    2. Acute on chronic systolic CHF.  The patient was over diuresed, her creatinine worsened to 1.82, estimated GFR of 26 from   32 on the  day of admission, she is to continue oral Lasix at home, following daily weights. She would benefit from CHF clinic, where she will be referred on the day of discharge. Echocardiogram was performed 08/15/2016, revealing ejection fraction of 45-50%, no significant valvular abnormalities were found,   however, study was poor. No ACE inhibitor was initiated due to worsening renal insufficiency   3. COPD exacerbation. Chest x-ray negative for any acute pneumonia. Treated with IV steroids, continue DuoNeb's, inhaled steroids, tapering steroids at home, short taper. Patient is to continue oxygen at home, now on 3 L of oxygen, oxygen saturations were 97%.  4. Diabetes-continue home medications, patient received sliding scale insulin while in the hospital, blood glucose levels were ranging between 185-240  5. Essential hypertension-continue Norvasc, metoprolol. The patient was not initiated on ACE inhibitor due to worsening renal function, to be addressed as outpatient  6. Hyperlipidemia-continue atorvastatin.  7. Diabetic neuropathy-continue gabapentin.  8. Depression-continue Prozac  CONSULTS OBTAINED:  Treatment Team:  Flora Lipps, MD  DRUG ALLERGIES:   Allergies  Allergen Reactions  . Other Rash    Pt reports allergy to metals.    DISCHARGE MEDICATIONS:   Current Discharge Medication List    START taking these medications   Details  azithromycin (ZITHROMAX) 250 MG tablet Take 1 tablet (250 mg total) by mouth daily. Qty: 6 each, Refills: 0    sucralfate (CARAFATE) 1 g tablet Take 1 tablet (1 g total) by mouth 4 (four) times daily. Qty: 120 tablet, Refills: 1      CONTINUE these medications which have CHANGED   Details  predniSONE (STERAPRED UNI-PAK 21 TAB) 10 MG (21) TBPK tablet Please take 6 pills in the morning on the day 1, then taper by one pill daily until finished, thank you Qty: 21 tablet, Refills: 0      CONTINUE these medications which have NOT CHANGED   Details  albuterol (PROVENTIL) (2.5 MG/3ML) 0.083% nebulizer solution Take by nebulization every 4 (four) hours as needed for wheezing or shortness of breath.    albuterol (VENTOLIN HFA) 108 (90 BASE) MCG/ACT inhaler Inhale 2 puffs into the lungs every 4 (four) hours as needed.     amLODipine  (NORVASC) 10 MG tablet Take 10 mg by mouth daily.      atorvastatin (LIPITOR) 40 MG tablet Take 40 mg by mouth daily.    Cholecalciferol (VITAMIN D3) 5000 units CAPS Take 1 capsule by mouth daily.    Cyanocobalamin 1500 MCG TBDP Take 3 tablets by mouth daily.    FLUoxetine (PROZAC) 40 MG capsule Take 40 mg by mouth.    furosemide (LASIX) 40 MG tablet Take 40 mg by mouth daily.    gabapentin (NEURONTIN) 100 MG capsule Take 200 mg by mouth 3 (three) times daily.    glipiZIDE (GLUCOTROL XL) 2.5 MG 24 hr tablet Take 2.5 mg by mouth daily.     ibuprofen (ADVIL,MOTRIN) 800 MG tablet Take 800 mg by mouth every 8 (eight) hours as needed for mild pain.     ipratropium-albuterol (DUONEB) 0.5-2.5 (3) MG/3ML SOLN Take 3 mLs by nebulization 4 (four) times daily. Qty: 360 mL, Refills: 0    LEVEMIR FLEXTOUCH 100 UNIT/ML Pen Inject 18 Units into the skin at bedtime.     metoprolol tartrate (LOPRESSOR) 25 MG tablet Take 1 tablet (25 mg total) by mouth 2 (two) times daily. Qty: 60 tablet, Refills: 0    NOVOLOG FLEXPEN 100 UNIT/ML FlexPen Inject 2-10 Units into the skin 3 (three) times daily  as needed. Sliding scale if blood sugar is above 200.    omeprazole (PRILOSEC) 20 MG capsule Take 20 mg by mouth daily.    oxyCODONE-acetaminophen (PERCOCET) 10-325 MG tablet Take 1 tablet by mouth every 8 (eight) hours as needed for pain.     promethazine (PHENERGAN) 25 MG tablet Take 1 tablet by mouth daily.    traZODone (DESYREL) 50 MG tablet Take 50 mg by mouth at bedtime.    umeclidinium bromide (INCRUSE ELLIPTA) 62.5 MCG/INH AEPB Inhale 1 puff into the lungs daily.    tiZANidine (ZANAFLEX) 4 MG capsule Take 4 mg by mouth 3 (three) times daily as needed for muscle spasms.      STOP taking these medications     cephALEXin (KEFLEX) 500 MG capsule          . The patient is to follow-up with primary care physicianyou experience worsenin as outpatient   g of your admission symptoms, develop  shortness of breath, life threatening emergency, suicidal or homicidal thoughts you must seek medical attention immediately by calling 911 or calling your MD immediately  if symptoms less severe.  You Must read complete instructions/literature along with all the possible adverse reactions/side effects for all the Medicines you take and that have been prescribed to you. Take any new Medicines after you have completely understood and accept all the possible adverse reactions/side effects.   Please note  You were cared for by a hospitalist during your hospital stay. If you have any questions about your discharge medications or the care you received while you were in the hospital after you are discharged, you can call the unit and asked to speak with the hospitalist on call if the hospitalist that took care of you is not available. Once you are discharged, your primary care physician will handle any further medical issues. Please note that NO REFILLS for any discharge medications will be authorized once you are discharged, as it is imperative that you return to your primary care physician (or establish a relationship with a primary care physician if you do not have one) for your aftercare needs so that they can reassess your need for medications and monitor your lab values.    Today   CHIEF COMPLAINT:   Chief Complaint  Patient presents with  . Shortness of Breath    HISTORY OF PRESENT ILLNESS:      VITAL SIGNS:  Blood pressure (!) 152/80, pulse 89, temperature 98.1 F (36.7 C), resp. rate 18, height 5\' 5"  (1.651 m), weight 103 kg (227 lb 1.2 oz), SpO2 97 %.  I/O:   Intake/Output Summary (Last 24 hours) at 10/29/16 1302 Last data filed at 10/29/16 0800  Gross per 24 hour  Intake              480 ml  Output                0 ml  Net              480 ml    PHYSICAL EXAMINATION:  GENERAL:  76 y.o.-year-old patient lying in the bed with no acute distress.  EYES: Pupils equal, round,  reactive to light and accommodation. No scleral icterus. Extraocular muscles intact.  HEENT: Head atraumatic, normocephalic. Oropharynx and nasopharynx clear.  NECK:  Supple, no jugular venous distention. No thyroid enlargement, no tenderness.  LUNGS: Normal breath sounds bilaterally, no wheezing, rales,rhonchi or crepitation. No use of accessory muscles of respiration.  CARDIOVASCULAR: S1, S2 normal. No  murmurs, rubs, or gallops.  ABDOMEN: Soft, non-tender, non-distended. Bowel sounds present. No organomegaly or mass.  EXTREMITIES: No pedal edema, cyanosis, or clubbing.  NEUROLOGIC: Cranial nerves II through XII are intact. Muscle strength 5/5 in all extremities. Sensation intact. Gait not checked.  PSYCHIATRIC: The patient is alert and oriented x 3.  SKIN: No obvious rash, lesion, or ulcer.   DATA REVIEW:   CBC  Recent Labs Lab 10/26/16 0344  WBC 8.2  HGB 9.3*  HCT 29.1*  PLT 295    Chemistries   Recent Labs Lab 10/25/16 1302  10/29/16 0430  NA 141  < > 144  K 5.3*  < > 3.7  CL 102  < > 93*  CO2 31  < > 40*  GLUCOSE 362*  < > 150*  BUN 20  < > 40*  CREATININE 1.61*  < > 1.82*  CALCIUM 8.5*  < > 8.6*  MG  --   --  2.3  AST 26  --   --   ALT 28  --   --   ALKPHOS 78  --   --   BILITOT 0.8  --   --   < > = values in this interval not displayed.  Cardiac Enzymes  Recent Labs Lab 10/25/16 1302  TROPONINI <0.03    Microbiology Results  Results for orders placed or performed during the hospital encounter of 10/25/16  MRSA PCR Screening     Status: None   Collection Time: 10/25/16  5:31 PM  Result Value Ref Range Status   MRSA by PCR NEGATIVE NEGATIVE Final    Comment:        The GeneXpert MRSA Assay (FDA approved for NASAL specimens only), is one component of a comprehensive MRSA colonization surveillance program. It is not intended to diagnose MRSA infection nor to guide or monitor treatment for MRSA infections.     RADIOLOGY:  No results  found.  EKG:   Orders placed or performed during the hospital encounter of 10/25/16  . ED EKG  . ED EKG  . EKG 12-Lead  . EKG 12-Lead      Management plans discussed with the patient, family and they are in agreement.  CODE STATUS:     Code Status Orders        Start     Ordered   10/25/16 1615  Full code  Continuous     10/25/16 1614    Code Status History    Date Active Date Inactive Code Status Order ID Comments User Context   08/14/2016 11:32 PM 08/19/2016  4:57 PM Full Code [id number]  Lance Coon, MD Inpatient   05/05/2016  7:47 AM 05/07/2016  6:04 PM Full Code [id number]  Flora Lipps, MD ED   03/24/2016  2:16 PM 03/28/2016  2:24 PM Full Code [id number]  Awilda Bill, NP ED      TOTAL TIME TAKING CARE OF THIS PATIENT: 40 minutes.    Theodoro Grist M.D on 10/29/2016 at 1:02 PM  Between 7am to 6pm - Pager - [phone]  After 6pm go to [email or web address] - password [email or web address] [facility]  [facility]  Office  [phone]  CC: Primary care physician; Denton Lank, MD

## 2016-10-29 NOTE — Progress Notes (Signed)
Inpatient Diabetes Program Recommendations  AACE/ADA: New Consensus Statement on Inpatient Glycemic Control (2015)  Target Ranges:  Prepandial:   less than 140 mg/dL      Peak postprandial:   less than 180 mg/dL (1-2 hours)      Critically ill patients:  140 - 180 mg/dL   Lab Results  Component Value Date   GLUCAP 123 (H) 10/29/2016   HGBA1C 6.7 (H) 03/26/2016    Review of Glycemic Control  Results for Mckenzie Taylor, Mckenzie Taylor (MRN [id number]) as of 10/29/2016 09:42  Ref. Range 10/28/2016 07:37 10/28/2016 11:20 10/28/2016 15:49 10/28/2016 21:08 10/29/2016 07:37  Glucose-Capillary Latest Ref Range: 65 - 99 mg/dL 181 (H) 221 (H) 298 (H) 185 (H) 123 (H)    Home DM Meds: Levemir 18 units QHS, Novolog 2-10 units TID plus sliding scale, Glipizide 2.5 mg daily   Current Insulin Orders: Novolog 0-15 units tid/hs, Levemir 12 units qhs   * prednisone 50mg  with breakfast  Recommendations:  Consider adding mealtime Novolog 5 units tid with meals- hold if patient eats less than 50%  Continue Novolog correction as ordered.   Gentry Fitz, RN, BA, MHA, CDE Diabetes Coordinator Inpatient Diabetes Program  [phone] (Team Pager) [phone] ([place]) 10/29/2016 9:46 AM

## 2016-10-29 NOTE — Care Management Note (Signed)
Case Management Note  Patient Details  Name: DANEY MOOR MRN: [id number] Date of Birth: 1940-05-24  Subjective/Objective:  Met with patient at bedside to discuss dishcarge planning. She lives with her son and cousin that both help her as needed. Patient has recently been [facility]She is interested in having them restart care. Referral to [facility] for SN and PT. She denies the need for home health aide. Verified that her phone nu,mber on face sheet is correct. She is on O3 at 3L from [facility]. Uses a cane for ambulation outside the home. She states she does have a walker and bsc.                     Action/Plan: [facility] for SN and PT. No DME needs.   Expected Discharge Date:                  Expected Discharge Plan:  [place]  In-House Referral:     Discharge planning Services  CM Consult  Post Acute Care Choice:  Home Health Choice offered to:  Patient  DME Arranged:    DME Agency:     HH Arranged:  RN, PT HH Agency:  [facility]  Status of Service:  In process, will continue to follow  If discussed at Long Length of Stay Meetings, dates discussed:    Additional Comments:  Jolly Mango, RN 10/29/2016, 9:24 AM

## 2016-10-29 NOTE — Progress Notes (Signed)
[facility] for electrolyte management   Pharmacy consulted for electrolyte management for 76 yo female admitted with acute respiratory distress and CHF exacerbation. Patient currently ordered furosemide 40mg  IV Q12hr.   Plan:   No replacement warranted at this time. Will obtain follow-up BMP with am labs.   8/16 0329 K 3.5, will give potassium chloride 20 mEq PO x 1 and recheck electrolytes tomorrow with AM labs.   8/17 0430 K 3.7, Ca 8.6, Phos 5.2, Mg 2.3. No replacement warranted at this time. Will recheck BMP tomorrow with AM labs.   Allergies  Allergen Reactions  . Other Rash    Pt reports allergy to metals.    Patient Measurements: Height: 5\' 5"  (165.1 cm) Weight: 227 lb 1.2 oz (103 kg) IBW/kg (Calculated) : 57  Vital Signs: Temp: 98.1 F (36.7 C) (08/16 2300) BP: 147/77 (08/16 2300) Pulse Rate: 89 (08/16 2300) Intake/Output from previous day: 08/16 0701 - 08/17 0700 In: 480 [P.O.:480] Out: 0  Intake/Output from this shift: No intake/output data recorded.  Labs:  Recent Labs  10/27/16 0324 10/28/16 0329 10/29/16 0430  CREATININE 1.52* 1.53* 1.82*  MG  --   --  2.3  PHOS  --   --  5.2*   Estimated Creatinine Clearance: 32.3 mL/min (A) (by C-G formula based on SCr of 1.82 mg/dL (H)).    Pharmacy will continue to monitor and adjust per consult.   Laural Benes, Pharm.D., BCPS Clinical Pharmacist 10/29/2016,7:10 AM

## 2016-10-29 NOTE — Care Management Important Message (Signed)
Important Message  Patient Details  Name: Mckenzie [place] MRN: [id number] Date of Birth: 1940-12-07   Medicare Important Message Given:  Yes    Jolly Mango, RN 10/29/2016, 10:48 AM

## 2016-10-29 NOTE — Evaluation (Signed)
Physical Therapy Evaluation Patient Details Name: Mckenzie Taylor MRN: [id number] DOB: 09-Oct-1940 Today's Date: 10/29/2016   History of Present Illness  Pt is a 76 y.o. F who presented to ED with SOB, R LE swelling, fatigue, and O2 sats in the low 80s. Imaging shows pulmonary edema. Pt admitted 10/25/2016 due to acute respiratory failure, acute on chronic CHF, and acute pulmonary edema. PMH: ulnar nerve repair, B hip replacement due to avascular necrosis, COPD (on 3L O2 at home), CAD, CHF, HTN, DM, GERD.     Clinical Impression  Prior to admission pt reports able to perform ADLs and ambulation/functional mobility independently with use of SPC for long distance ambulation; pt reports she is on 3L O2 via [place] at baseline. Pt reports family available 24/7 upon discharge. Pt A and O x4 throughout session. Pt reports 7/10 B LE pain beginning and end of session with no increase of pain reported during session. Pt currently modified independent with bed mobility; CGA and RW for transfers and ambulation (60 ft); pt desat to 86% sitting on EOB on 3L [place]; able to return to 91% on 3L O2 sitting on EOB with cues for deep breathing; pt on 4L [place] during ambulation (nursing notified and aware); O2 desat to 87% on 4L [place] with ambulation; able to return quickly to baseline on 3L [place] with sitting and vc's for deep breathing. Pt will continue to benefit from skilled PT to address strength, balance, activity tolerance, and functional mobility deficits. Recommended discharge to home with HHPT.    Follow Up Recommendations Home health PT    Equipment Recommendations  Rolling walker with 5" wheels    Recommendations for Other Services       Precautions / Restrictions Precautions Precautions: Fall;Other (comment) Precaution Comments: Monitor O2 Restrictions Weight Bearing Restrictions: No      Mobility  Bed Mobility Overal bed mobility: Modified Independent Bed Mobility: Supine to Sit     Supine to sit:  Modified independent (Device/Increase time);HOB elevated     General bed mobility comments: pt able to perform supine to sit with increased time/effort noted with HOB elevated; pt reports HOB elevated at home  Transfers Overall transfer level: Needs assistance Equipment used: Rolling walker (2 wheeled) Transfers: Sit to/from Stand Sit to Stand: Min guard         General transfer comment: pt able to perform sit to stand with CGA and RW; vc's for B LE placement   Ambulation/Gait   Ambulation Distance (Feet): 60 Feet Assistive device: Rolling walker (2 wheeled)   Gait velocity: decreased   General Gait Details: pt able to ambulate 60 ft with CGA and RW from EOB to nursing station and back to bed side chair; O2 desat to 87% on 4L [place] with ambulation; able to return quickly to baseline on 3L  with sitting and vc's for deep breathing  Stairs            Wheelchair Mobility    Modified Rankin (Stroke Patients Only)       Balance Overall balance assessment: Needs assistance Sitting-balance support: No upper extremity supported;Feet supported Sitting balance-Leahy Scale: Fair Sitting balance - Comments: pt able to maintain static sitting balance with no UE and B LE support with no overt loss of balance noted   Standing balance support: Bilateral upper extremity supported Standing balance-Leahy Scale: Fair Standing balance comment: pt able to perform static and dynamic standing balance with B UE support via RW with no overt loss of balance noted  Pertinent Vitals/Pain Pain Assessment: 0-10 Pain Score: 7  Pain Location: pt reports 7/10 B LE pain beginning and end of session with no increase of pain reported during session Pain Descriptors / Indicators: Aching Pain Intervention(s): Limited activity within patient's tolerance;Monitored during session;Repositioned HR - 92 to 106 bpm throughout session via telemetry O2 - pt desat to 86%  sitting on EOB on 3L [place]; able to return to 91% on 3L O2 sitting on EOB with cues for deep breathing; pt on 4L [place] during ambulation; O2 desat to 87% on 4L [place] with ambulation; able to return quickly to baseline 93% on 3L  with sitting and vc's for deep breathing    Home Living Family/patient expects to be discharged to:: Private residence Living Arrangements: Children Available Help at Discharge: Family;Available 24 hours/day Type of Home: House Home Access: Level entry (level entry through back door)     Home Layout: One level Home Equipment: Walker - 2 wheels;Cane - single point;Bedside commode;Shower seat;Toilet riser      Prior Function Level of Independence: Independent with assistive device(s)         Comments: pt able to perform ADLs and in-home ambulation with no AD; pt uses SPC for public ambulation     Hand Dominance   Dominant Hand: Right    Extremity/Trunk Assessment   Upper Extremity Assessment Upper Extremity Assessment: Overall WFL for tasks assessed    Lower Extremity Assessment Lower Extremity Assessment: Generalized weakness       Communication   Communication: No difficulties  Cognition Arousal/Alertness: Awake/alert Behavior During Therapy: WFL for tasks assessed/performed Overall Cognitive Status: Within Functional Limits for tasks assessed                                        General Comments      Exercises     Assessment/Plan    PT Assessment Patient needs continued PT services  PT Problem List Decreased strength;Decreased activity tolerance;Decreased balance;Decreased mobility;Decreased knowledge of use of DME;Cardiopulmonary status limiting activity;Pain       PT Treatment Interventions DME instruction;Gait training;Functional mobility training;Therapeutic activities;Therapeutic exercise;Balance training;Patient/family education    PT Goals (Current goals can be found in the Care Plan section)  Acute Rehab PT  Goals Patient Stated Goal: to return home PT Goal Formulation: With patient Time For Goal Achievement: 11/12/16 Potential to Achieve Goals: Good    Frequency Min 2X/week   Barriers to discharge        Co-evaluation               AM-PAC PT "6 Clicks" Daily Activity  Outcome Measure Difficulty turning over in bed (including adjusting bedclothes, sheets and blankets)?: A Little Difficulty moving from lying on back to sitting on the side of the bed? : A Little Difficulty sitting down on and standing up from a chair with arms (e.g., wheelchair, bedside commode, etc,.)?: A Little Help needed moving to and from a bed to chair (including a wheelchair)?: A Little Help needed walking in hospital room?: A Little Help needed climbing 3-5 steps with a railing? : A Lot 6 Click Score: 17    End of Session Equipment Utilized During Treatment: Gait belt;Oxygen Activity Tolerance: Patient limited by fatigue Patient left: in chair;with call bell/phone within reach;with chair alarm set Nurse Communication: Mobility status;Other (comment) (O2 desat during ambulation) PT Visit Diagnosis: Unsteadiness on feet (R26.81);Other abnormalities of gait  and mobility (R26.89);Muscle weakness (generalized) (M62.81);Pain Pain - Right/Left: Right (and left) Pain - part of body: Leg    Time: 4069-8614 PT Time Calculation (min) (ACUTE ONLY): 40 min   Charges:         PT G CodesLaqueta Carina, SPT 10-31-16,12:12 PM [phone]

## 2016-10-29 NOTE — Discharge Planning (Signed)
Patient IV and tele removed,  RN assessment and VS revealed stability for DC to home.  Informed of suggested FU appt and appts made. Patient give x1 script and others e-scribed to [facility]. Discharge papers given, explained and educated.  Once ready, will be wheeled to front and family transporting home via car (with home o2 tank in car).  Waiting on ride to arrive.

## 2016-10-29 NOTE — Care Management Note (Signed)
Case Management Note  Patient Details  Name: Mckenzie Taylor MRN: [id number] Date of Birth: 04/05/1940  Subjective/Objective:  Discharging today                  Action/Plan: [facility] notified of discharge. RN and PT. Patient updated on POC.   Expected Discharge Date:  10/29/16               Expected Discharge Plan:  [place]  In-House Referral:     Discharge planning Services  CM Consult  Post Acute Care Choice:  Home Health Choice offered to:  Patient  DME Arranged:    DME Agency:     HH Arranged:  RN, PT HH Agency:  [facility]  Status of Service:  Completed, signed off  If discussed at [place] of Stay Meetings, dates discussed:    Additional Comments:  Jolly Mango, RN 10/29/2016, 2:13 PM

## 2016-11-04 ENCOUNTER — Ambulatory Visit: Payer: Medicare Other | Admitting: Family

## 2016-11-04 ENCOUNTER — Telehealth: Payer: Self-pay | Admitting: Family

## 2016-11-04 NOTE — Telephone Encounter (Signed)
Patient missed his initial appointment at the [facility] on 11/04/16. Will attempt to reschedule.

## 2016-11-25 ENCOUNTER — Inpatient Hospital Stay
Admission: EM | Admit: 2016-11-25 | Discharge: 2016-11-27 | DRG: 190 | Disposition: A | Discharge status: Home / Self Care | Payer: Medicare Other | Attending: Internal Medicine | Admitting: Internal Medicine

## 2016-11-25 ENCOUNTER — Emergency Department: Payer: Medicare Other

## 2016-11-25 DIAGNOSIS — Z7982 Long term (current) use of aspirin: Secondary | ICD-10-CM | POA: Diagnosis not present

## 2016-11-25 DIAGNOSIS — E1122 Type 2 diabetes mellitus with diabetic chronic kidney disease: Secondary | ICD-10-CM | POA: Diagnosis present

## 2016-11-25 DIAGNOSIS — D638 Anemia in other chronic diseases classified elsewhere: Secondary | ICD-10-CM | POA: Diagnosis present

## 2016-11-25 DIAGNOSIS — Z66 Do not resuscitate: Secondary | ICD-10-CM | POA: Diagnosis not present

## 2016-11-25 DIAGNOSIS — Z87891 Personal history of nicotine dependence: Secondary | ICD-10-CM | POA: Diagnosis not present

## 2016-11-25 DIAGNOSIS — E785 Hyperlipidemia, unspecified: Secondary | ICD-10-CM | POA: Diagnosis present

## 2016-11-25 DIAGNOSIS — R7989 Other specified abnormal findings of blood chemistry: Secondary | ICD-10-CM | POA: Diagnosis present

## 2016-11-25 DIAGNOSIS — Z9981 Dependence on supplemental oxygen: Secondary | ICD-10-CM

## 2016-11-25 DIAGNOSIS — Z79899 Other long term (current) drug therapy: Secondary | ICD-10-CM | POA: Diagnosis not present

## 2016-11-25 DIAGNOSIS — J9622 Acute and chronic respiratory failure with hypercapnia: Secondary | ICD-10-CM | POA: Diagnosis present

## 2016-11-25 DIAGNOSIS — Z91048 Other nonmedicinal substance allergy status: Secondary | ICD-10-CM

## 2016-11-25 DIAGNOSIS — I5022 Chronic systolic (congestive) heart failure: Secondary | ICD-10-CM | POA: Diagnosis present

## 2016-11-25 DIAGNOSIS — E1165 Type 2 diabetes mellitus with hyperglycemia: Secondary | ICD-10-CM | POA: Diagnosis not present

## 2016-11-25 DIAGNOSIS — Z794 Long term (current) use of insulin: Secondary | ICD-10-CM

## 2016-11-25 DIAGNOSIS — E039 Hypothyroidism, unspecified: Secondary | ICD-10-CM | POA: Diagnosis present

## 2016-11-25 DIAGNOSIS — T380X5A Adverse effect of glucocorticoids and synthetic analogues, initial encounter: Secondary | ICD-10-CM | POA: Diagnosis not present

## 2016-11-25 DIAGNOSIS — F329 Major depressive disorder, single episode, unspecified: Secondary | ICD-10-CM | POA: Diagnosis present

## 2016-11-25 DIAGNOSIS — N183 Chronic kidney disease, stage 3 (moderate): Secondary | ICD-10-CM | POA: Diagnosis present

## 2016-11-25 DIAGNOSIS — J9621 Acute and chronic respiratory failure with hypoxia: Secondary | ICD-10-CM | POA: Diagnosis present

## 2016-11-25 DIAGNOSIS — K219 Gastro-esophageal reflux disease without esophagitis: Secondary | ICD-10-CM | POA: Diagnosis present

## 2016-11-25 DIAGNOSIS — N179 Acute kidney failure, unspecified: Secondary | ICD-10-CM | POA: Diagnosis not present

## 2016-11-25 DIAGNOSIS — Z23 Encounter for immunization: Secondary | ICD-10-CM | POA: Diagnosis present

## 2016-11-25 DIAGNOSIS — J441 Chronic obstructive pulmonary disease with (acute) exacerbation: Principal | ICD-10-CM | POA: Diagnosis present

## 2016-11-25 DIAGNOSIS — I2510 Atherosclerotic heart disease of native coronary artery without angina pectoris: Secondary | ICD-10-CM | POA: Diagnosis present

## 2016-11-25 DIAGNOSIS — Z96643 Presence of artificial hip joint, bilateral: Secondary | ICD-10-CM | POA: Diagnosis present

## 2016-11-25 DIAGNOSIS — J449 Chronic obstructive pulmonary disease, unspecified: Secondary | ICD-10-CM | POA: Diagnosis present

## 2016-11-25 DIAGNOSIS — I130 Hypertensive heart and chronic kidney disease with heart failure and stage 1 through stage 4 chronic kidney disease, or unspecified chronic kidney disease: Secondary | ICD-10-CM | POA: Diagnosis present

## 2016-11-25 DIAGNOSIS — N189 Chronic kidney disease, unspecified: Secondary | ICD-10-CM

## 2016-11-25 LAB — BLOOD GAS, ARTERIAL
Acid-Base Excess: 8.4 mmol/L — ABNORMAL HIGH (ref 0.0–2.0)
Bicarbonate: 34.2 mmol/L — ABNORMAL HIGH (ref 20.0–28.0)
Delivery systems: POSITIVE
Expiratory PAP: 5
FIO2: 0.25
Inspiratory PAP: 10
O2 Saturation: 89.5 %
Patient temperature: 37
RATE: 8 resp/min
pCO2 arterial: 54 mmHg — ABNORMAL HIGH (ref 32.0–48.0)
pH, Arterial: 7.41 (ref 7.350–7.450)
pO2, Arterial: 57 mmHg — ABNORMAL LOW (ref 83.0–108.0)

## 2016-11-25 LAB — GLUCOSE, CAPILLARY: Glucose-Capillary: 559 mg/dL (ref 65–99)

## 2016-11-25 LAB — COMPREHENSIVE METABOLIC PANEL
ALT: 14 U/L (ref 14–54)
AST: 15 U/L (ref 15–41)
Albumin: 3.5 g/dL (ref 3.5–5.0)
Alkaline Phosphatase: 65 U/L (ref 38–126)
Anion gap: 10 (ref 5–15)
BUN: 24 mg/dL — ABNORMAL HIGH (ref 6–20)
CO2: 32 mmol/L (ref 22–32)
Calcium: 9 mg/dL (ref 8.9–10.3)
Chloride: 99 mmol/L — ABNORMAL LOW (ref 101–111)
Creatinine, Ser: 2.01 mg/dL — ABNORMAL HIGH (ref 0.44–1.00)
GFR calc Af Amer: 27 mL/min — ABNORMAL LOW (ref >60)
GFR calc non Af Amer: 23 mL/min — ABNORMAL LOW (ref >60)
Glucose, Bld: 202 mg/dL — ABNORMAL HIGH (ref 65–99)
Potassium: 4.2 mmol/L (ref 3.5–5.1)
Sodium: 141 mmol/L (ref 135–145)
Total Bilirubin: 0.6 mg/dL (ref 0.3–1.2)
Total Protein: 6.7 g/dL (ref 6.5–8.1)

## 2016-11-25 LAB — CBC WITH DIFFERENTIAL/PLATELET
Band Neutrophils: 1 %
Basophils Absolute: 0 10*3/uL (ref 0–0.1)
Basophils Relative: 0 %
Blasts: 0 %
Eosinophils Absolute: 0.3 10*3/uL (ref 0–0.7)
Eosinophils Relative: 3 %
HCT: 28.7 % — ABNORMAL LOW (ref 35.0–47.0)
Hemoglobin: 9.2 g/dL — ABNORMAL LOW (ref 12.0–16.0)
Lymphocytes Relative: 26 %
Lymphs Abs: 2.4 10*3/uL (ref 1.0–3.6)
MCH: 27.5 pg (ref 26.0–34.0)
MCHC: 32.1 g/dL (ref 32.0–36.0)
MCV: 85.7 fL (ref 80.0–100.0)
Metamyelocytes Relative: 1 %
Monocytes Absolute: 0.5 10*3/uL (ref 0.2–0.9)
Monocytes Relative: 6 %
Myelocytes: 0 %
Neutro Abs: 5.9 10*3/uL (ref 1.4–6.5)
Neutrophils Relative %: 63 %
Other: 0 %
Platelets: 317 10*3/uL (ref 150–440)
Promyelocytes Absolute: 0 %
RBC: 3.35 MIL/uL — ABNORMAL LOW (ref 3.80–5.20)
RDW: 20.4 % — ABNORMAL HIGH (ref 11.5–14.5)
Smear Review: ADEQUATE
WBC: 9.1 10*3/uL (ref 3.6–11.0)
nRBC: 0 /100 WBC

## 2016-11-25 LAB — LACTIC ACID, PLASMA
Lactic Acid, Venous: 2.1 mmol/L (ref 0.5–1.9)
Lactic Acid, Venous: 1.4 mmol/L (ref 0.5–1.9)

## 2016-11-25 LAB — LIPASE, BLOOD: Lipase: 25 U/L (ref 11–51)

## 2016-11-25 LAB — TROPONIN I: Troponin I: <0.03 ng/mL (ref <0.03)

## 2016-11-25 LAB — BRAIN NATRIURETIC PEPTIDE: B Natriuretic Peptide: 278 pg/mL — ABNORMAL HIGH (ref 0.0–100.0)

## 2016-11-25 MED ORDER — SUCRALFATE 1 G PO TABS
1.0000 g | ORAL_TABLET | Freq: Four times a day (QID) | ORAL | Status: DC
  Administered 2016-11-25 – 2016-11-27 (×6): 1 g via ORAL
  Filled 2016-11-25 (×6): qty 1

## 2016-11-25 MED ORDER — ACETAMINOPHEN 650 MG RE SUPP
650.0000 mg | Freq: Four times a day (QID) | RECTAL | Status: DC | PRN
Start: 2016-11-25 — End: 2016-11-27

## 2016-11-25 MED ORDER — ACETAMINOPHEN 325 MG PO TABS: 650.0000 mg | ORAL_TABLET | Freq: Four times a day (QID) | ORAL | Status: DC | PRN

## 2016-11-25 MED ORDER — AMLODIPINE BESYLATE 10 MG PO TABS
10.0000 mg | ORAL_TABLET | Freq: Every day | ORAL | Status: DC
  Administered 2016-11-26 – 2016-11-27 (×2): 10 mg via ORAL
  Filled 2016-11-25 (×2): qty 1

## 2016-11-25 MED ORDER — MOMETASONE FURO-FORMOTEROL FUM 200-5 MCG/ACT IN AERO
2.0000 | INHALATION_SPRAY | Freq: Two times a day (BID) | RESPIRATORY_TRACT | Status: DC
  Administered 2016-11-25 – 2016-11-27 (×4): 2 via RESPIRATORY_TRACT
  Filled 2016-11-25: qty 8.8

## 2016-11-25 MED ORDER — ATORVASTATIN CALCIUM 20 MG PO TABS
40.0000 mg | ORAL_TABLET | Freq: Every day | ORAL | Status: DC
  Administered 2016-11-25 – 2016-11-26 (×2): 40 mg via ORAL
  Filled 2016-11-25 (×2): qty 2

## 2016-11-25 MED ORDER — PANTOPRAZOLE SODIUM 40 MG PO TBEC
40.0000 mg | DELAYED_RELEASE_TABLET | Freq: Every day | ORAL | Status: DC
  Administered 2016-11-26 – 2016-11-27 (×2): 40 mg via ORAL
  Filled 2016-11-25 (×2): qty 1

## 2016-11-25 MED ORDER — UMECLIDINIUM BROMIDE 62.5 MCG/INH IN AEPB
1.0000 | INHALATION_SPRAY | Freq: Every day | RESPIRATORY_TRACT | Status: DC
Start: 2016-11-26 — End: 2016-11-27
  Administered 2016-11-26 – 2016-11-27 (×2): 1 via RESPIRATORY_TRACT
  Filled 2016-11-25: qty 7

## 2016-11-25 MED ORDER — HYDROCODONE-ACETAMINOPHEN 5-325 MG PO TABS: 1.0000 | ORAL_TABLET | ORAL | Status: DC | PRN

## 2016-11-25 MED ORDER — ONDANSETRON HCL 4 MG/2ML IJ SOLN: 4.0000 mg | Freq: Four times a day (QID) | INTRAMUSCULAR | Status: DC | PRN

## 2016-11-25 MED ORDER — ALBUTEROL SULFATE (2.5 MG/3ML) 0.083% IN NEBU
2.5000 mg | INHALATION_SOLUTION | RESPIRATORY_TRACT | Status: DC | PRN
  Administered 2016-11-27: 02:00:00 2.5 mg via RESPIRATORY_TRACT
  Filled 2016-11-25: qty 3

## 2016-11-25 MED ORDER — METHYLPREDNISOLONE SODIUM SUCC 40 MG IJ SOLR
40.0000 mg | Freq: Three times a day (TID) | INTRAMUSCULAR | Status: DC
  Administered 2016-11-25 – 2016-11-26 (×2): 40 mg via INTRAVENOUS
  Filled 2016-11-25 (×2): qty 1

## 2016-11-25 MED ORDER — ENOXAPARIN SODIUM 40 MG/0.4ML ~~LOC~~ SOLN
40.0000 mg | SUBCUTANEOUS | Status: DC
  Administered 2016-11-25: 22:00:00 40 mg via SUBCUTANEOUS
  Filled 2016-11-25: qty 0.4

## 2016-11-25 MED ORDER — FUROSEMIDE 10 MG/ML IJ SOLN
20.0000 mg | Freq: Two times a day (BID) | INTRAMUSCULAR | Status: DC
  Administered 2016-11-25: 22:00:00 20 mg via INTRAVENOUS
  Filled 2016-11-25: qty 2

## 2016-11-25 MED ORDER — HYDRALAZINE HCL 20 MG/ML IJ SOLN: 10.0000 mg | Freq: Four times a day (QID) | INTRAMUSCULAR | Status: DC | PRN

## 2016-11-25 MED ORDER — FLUOXETINE HCL 20 MG PO CAPS
40.0000 mg | ORAL_CAPSULE | Freq: Every day | ORAL | Status: DC
  Administered 2016-11-26 – 2016-11-27 (×2): 40 mg via ORAL
  Filled 2016-11-25 (×2): qty 2

## 2016-11-25 MED ORDER — GABAPENTIN 100 MG PO CAPS
200.0000 mg | ORAL_CAPSULE | Freq: Three times a day (TID) | ORAL | Status: DC
  Administered 2016-11-25 – 2016-11-27 (×5): 200 mg via ORAL
  Filled 2016-11-25 (×5): qty 2

## 2016-11-25 MED ORDER — PROMETHAZINE HCL 25 MG PO TABS
25.0000 mg | ORAL_TABLET | Freq: Every day | ORAL | Status: DC
  Administered 2016-11-26 – 2016-11-27 (×2): 25 mg via ORAL
  Filled 2016-11-25 (×2): qty 1

## 2016-11-25 MED ORDER — VITAMIN D 1000 UNITS PO TABS
1000.0000 [IU] | ORAL_TABLET | Freq: Every day | ORAL | Status: DC
  Administered 2016-11-26 – 2016-11-27 (×2): 1000 [IU] via ORAL
  Filled 2016-11-25 (×2): qty 1

## 2016-11-25 MED ORDER — AMLODIPINE BESYLATE 10 MG PO TABS: 10.0000 mg | ORAL_TABLET | Freq: Every day | ORAL | Status: DC

## 2016-11-25 MED ORDER — VITAMIN B-12 1000 MCG PO TABS
1500.0000 ug | ORAL_TABLET | Freq: Every day | ORAL | Status: DC
  Administered 2016-11-26 – 2016-11-27 (×2): 1500 ug via ORAL
  Filled 2016-11-25: qty 3
  Filled 2016-11-25: qty 2
  Filled 2016-11-25: qty 3
  Filled 2016-11-25: qty 2
  Filled 2016-11-25: qty 1.5

## 2016-11-25 MED ORDER — INFLUENZA VAC SPLIT HIGH-DOSE 0.5 ML IM SUSY
0.5000 mL | PREFILLED_SYRINGE | INTRAMUSCULAR | Status: AC
  Administered 2016-11-27: 0.5 mL via INTRAMUSCULAR
  Filled 2016-11-25 (×2): qty 0.5

## 2016-11-25 MED ORDER — IPRATROPIUM-ALBUTEROL 0.5-2.5 (3) MG/3ML IN SOLN
3.0000 mL | Freq: Four times a day (QID) | RESPIRATORY_TRACT | Status: DC
  Administered 2016-11-25 – 2016-11-27 (×6): 3 mL via RESPIRATORY_TRACT
  Filled 2016-11-25 (×6): qty 3

## 2016-11-25 MED ORDER — SENNOSIDES-DOCUSATE SODIUM 8.6-50 MG PO TABS: 1.0000 | ORAL_TABLET | Freq: Every evening | ORAL | Status: DC | PRN

## 2016-11-25 MED ORDER — ALBUTEROL SULFATE (2.5 MG/3ML) 0.083% IN NEBU: 2.5000 mg | INHALATION_SOLUTION | Freq: Four times a day (QID) | RESPIRATORY_TRACT | Status: DC

## 2016-11-25 MED ORDER — IPRATROPIUM-ALBUTEROL 0.5-2.5 (3) MG/3ML IN SOLN
9.0000 mL | Freq: Once | RESPIRATORY_TRACT | Status: AC
  Administered 2016-11-25: 9 mL via RESPIRATORY_TRACT
  Filled 2016-11-25: qty 3

## 2016-11-25 MED ORDER — ASPIRIN EC 81 MG PO TBEC
81.0000 mg | DELAYED_RELEASE_TABLET | Freq: Every day | ORAL | Status: DC
  Administered 2016-11-26 – 2016-11-27 (×2): 81 mg via ORAL
  Filled 2016-11-25 (×2): qty 1

## 2016-11-25 MED ORDER — TIOTROPIUM BROMIDE MONOHYDRATE 18 MCG IN CAPS
18.0000 ug | ORAL_CAPSULE | Freq: Every day | RESPIRATORY_TRACT | Status: DC
  Administered 2016-11-26 – 2016-11-27 (×2): 18 ug via RESPIRATORY_TRACT
  Filled 2016-11-25: qty 5

## 2016-11-25 MED ORDER — METOPROLOL TARTRATE 25 MG PO TABS
25.0000 mg | ORAL_TABLET | Freq: Every day | ORAL | Status: DC
  Administered 2016-11-26 – 2016-11-27 (×2): 25 mg via ORAL
  Filled 2016-11-25 (×2): qty 1

## 2016-11-25 MED ORDER — ENALAPRIL MALEATE 5 MG PO TABS
5.0000 mg | ORAL_TABLET | Freq: Every day | ORAL | Status: DC
  Administered 2016-11-26: 5 mg via ORAL
  Filled 2016-11-25: qty 1

## 2016-11-25 MED ORDER — INSULIN DETEMIR 100 UNIT/ML ~~LOC~~ SOLN
25.0000 [IU] | Freq: Every day | SUBCUTANEOUS | Status: DC
  Administered 2016-11-25: 25 [IU] via SUBCUTANEOUS
  Filled 2016-11-25: qty 0.25

## 2016-11-25 MED ORDER — METHYLPREDNISOLONE SODIUM SUCC 125 MG IJ SOLR
125.0000 mg | Freq: Once | INTRAMUSCULAR | Status: AC
  Administered 2016-11-25: 125 mg via INTRAVENOUS
  Filled 2016-11-25: qty 2

## 2016-11-25 MED ORDER — INSULIN ASPART 100 UNIT/ML ~~LOC~~ SOLN
0.0000 [IU] | Freq: Three times a day (TID) | SUBCUTANEOUS | Status: DC
Start: 2016-11-26 — End: 2016-11-26

## 2016-11-25 MED ORDER — ONDANSETRON HCL 4 MG PO TABS: 4.0000 mg | ORAL_TABLET | Freq: Four times a day (QID) | ORAL | Status: DC | PRN

## 2016-11-25 NOTE — ED Provider Notes (Signed)
[facility] Emergency Department Provider Note  ____________________________________________   First MD Initiated Contact with Patient 11/25/16 1502     (approximate)  I have reviewed the triage vital signs and the nursing notes.   HISTORY  Chief Complaint Shortness of Breath  Level 5 caveat:  history/ROS limited by acute/critical illness  HPI Mckenzie Taylor is a 76 y.o. female with extensive past medical history including both COPD and CHF and who is oxygen dependent on 3 L at [facility] presents by EMS for evaluation of acute and severe shortness of breath.  The patient is on CPAP upon arrival but is able to speak in short phrases.  Apparently her shortness of breath started yesterday and has been gradually worsening over the last 24 hours.  She had a home health nurse today who called the doctor, and the doctor called EMS.  She was breathing 40+ times a minute when EMS arrived with coarse breath sounds and wheezing throughout and had an SPO2 of about 90% at rest and on her home oxygen.  She was in less distress upon arrival to the ED after being put on CPAP and receiving a DuoNeb in-line with the CPAP prior to arrival.  She reports some moderate chest pressure accompanying the shortness of breath.  She denies any recent fever/chills.  She denies abdominal pain, nausea, and vomiting.  She states that she is currently taking Cipro for "cellulitis in my legs" but this seems to be no better and no worse.  Overall her symptoms are severe and more helped significantly by the CPAP.   Past Medical History:  Diagnosis Date  . Autosomal recessive polycystic kidneys   . Avascular necrosis of hip (HCC)    bilateral  . CAD (coronary artery disease)    mild  . CHF (congestive heart failure) ([place])   . Chronic kidney disease    cyst  . COPD (chronic obstructive pulmonary disease) ([place])   . Diabetes type 2, controlled ([place])   . GERD (gastroesophageal reflux disease)   .  HTN (hypertension)   . Hypothyroidism    subclinical. low TSH, normal thyroid panel. biopsy 2010  . Vitamin B12 deficiency     Patient Active Problem List   Diagnosis Date Noted  . COPD (chronic obstructive pulmonary disease) ([place]) 11/25/2016  . Nausea and vomiting 10/29/2016  . Acute on chronic congestive heart failure ([place])   . Acute pulmonary edema (HCC)   . Acute respiratory failure with hypoxia ([place]) 10/25/2016  . COPD with acute exacerbation ([place]) 08/14/2016  . CAP (community acquired pneumonia) 08/14/2016  . Acute systolic CHF (congestive heart failure) ([place]) 08/14/2016  . Accelerated hypertension 08/14/2016  . GERD (gastroesophageal reflux disease) 08/14/2016  . Diabetes ([place]) 08/14/2016  . Respiratory distress 05/05/2016  . Acute respiratory failure ([place]) 03/24/2016  . 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 TOBACCO ABUSE 12/17/2008  . Coronary atherosclerosis of native coronary artery 12/17/2008  . 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 CLAUDICATION, INTERMITTENT 12/17/2008    Past Surgical History:  Procedure Laterality Date  . CHOLECYSTECTOMY    . hip replacement     bilateral-secondary to avascular necrosis  . right eye lens replacement    . ULNAR NERVE REPAIR     bilateral  . VESICOVAGINAL FISTULA CLOSURE W/ TAH      Prior to Admission medications   Medication Sig Start Date End Date Taking? Authorizing Provider  albuterol (PROVENTIL) (2.5 MG/3ML) 0.083% nebulizer solution Take by nebulization every  4 (four) hours as needed for wheezing or shortness of breath.   Yes [provider]  albuterol (VENTOLIN HFA) 108 (90 BASE) MCG/ACT inhaler Inhale 2 puffs into the lungs every 4 (four) hours as needed.    Yes [provider]  amLODipine (NORVASC) 10 MG tablet Take 10 mg by mouth daily.     Yes [provider]  aspirin 81 MG tablet Take by mouth daily.   Yes [provider]  atorvastatin (LIPITOR) 40  MG tablet Take 40 mg by mouth daily.   Yes [provider]  Cholecalciferol (VITAMIN D3) 5000 units CAPS Take 1 capsule by mouth daily.   Yes [provider]  ciprofloxacin (CIPRO) 500 MG tablet Take 500 mg by mouth 3 (three) times daily.   Yes [provider]  Cyanocobalamin 1500 MCG TBDP Take 3 tablets by mouth daily.   Yes [provider]  enalapril (VASOTEC) 5 MG tablet Take 5 mg by mouth daily.   Yes [provider]  FLUoxetine (PROZAC) 40 MG capsule Take 40 mg by mouth.   Yes [provider]  Fluticasone-Salmeterol (ADVAIR) 250-50 MCG/DOSE AEPB Inhale 1 puff into the lungs 2 (two) times daily.   Yes [provider]  furosemide (LASIX) 40 MG tablet Take 40 mg by mouth daily. 04/06/16  Yes [provider]  gabapentin (NEURONTIN) 100 MG capsule Take 200 mg by mouth 3 (three) times daily.   Yes [provider]  ipratropium-albuterol (DUONEB) 0.5-2.5 (3) MG/3ML SOLN Take 3 mLs by nebulization 4 (four) times daily. 03/28/16  Yes Vaughan Basta, MD  LEVEMIR FLEXTOUCH 100 UNIT/ML Pen Inject 25 Units into the skin at bedtime.  07/23/16  Yes [provider]  metoprolol tartrate (LOPRESSOR) 25 MG tablet Take 1 tablet (25 mg total) by mouth 2 (two) times daily. Patient taking differently: Take 25 mg by mouth daily.  08/19/16  Yes Vaughan Basta, MD  NOVOLOG FLEXPEN 100 UNIT/ML FlexPen Inject 2-10 Units into the skin 3 (three) times daily as needed. Sliding scale if blood sugar is above 200. 04/02/16  Yes [provider]  omeprazole (PRILOSEC) 20 MG capsule Take 20 mg by mouth daily.   Yes [provider]  promethazine (PHENERGAN) 25 MG tablet Take 1 tablet by mouth daily. 08/16/14  Yes [provider]  sucralfate (CARAFATE) 1 g tablet Take 1 tablet (1 g total) by mouth 4 (four) times daily. 10/29/16 10/29/17 Yes Theodoro Grist, MD  tiotropium (SPIRIVA) 18 MCG inhalation capsule Place  18 mcg into inhaler and inhale daily.   Yes [provider]  tiZANidine (ZANAFLEX) 4 MG capsule Take 4 mg by mouth 3 (three) times daily as needed for muscle spasms.   Yes [provider]  traZODone (DESYREL) 50 MG tablet Take 50 mg by mouth at bedtime. 08/10/16  Yes [provider]  umeclidinium bromide (INCRUSE ELLIPTA) 62.5 MCG/INH AEPB Inhale 1 puff into the lungs daily.   Yes [provider]  predniSONE (STERAPRED UNI-PAK 21 TAB) 10 MG (21) TBPK tablet Please take 6 pills in the morning on the day 1, then taper by one pill daily until finished, thank you Patient not taking: Reported on 11/25/2016 10/29/16   Theodoro Grist, MD    Allergies Other  Family History  Problem Relation Age of Onset  . Cancer Father        lung  . COPD Mother   . Heart disease Mother   . Heart disease Maternal Uncle  Social History Social History  Substance Use Topics  . Smoking status: Former Smoker    Packs/day: 1.00    Years: 50.00  . Smokeless tobacco: Never Used     Comment: 1 ppd - 50 years   . Alcohol use No    Review of Systems Constitutional: No fever/chills Eyes: No visual changes. ENT: No sore throat. Cardiovascular: moderate chest pressure associated with shortness of breath Respiratory: severe shortness of breath with wheezing and coarse breath sounds, gradually worsening since yesterday Gastrointestinal: No abdominal pain.  No nausea, no vomiting.  No diarrhea.  No constipation. Genitourinary: Negative for dysuria. Musculoskeletal: Negative for neck pain.  Negative for back pain. Integumentary: Negative for rash. Neurological: Negative for headaches, focal weakness or numbness.   ____________________________________________   PHYSICAL EXAM:  VITAL SIGNS: ED Triage Vitals  Enc Vitals Group     BP 11/25/16 1507 (!) 147/76     Pulse Rate 11/25/16 1507 100     Resp --      Temp 11/25/16 1507 98.3 F (36.8 C)     Temp Source 11/25/16  1507 Axillary     SpO2 11/25/16 1503 100 %     Weight 11/25/16 1508 104.3 kg (230 lb)     Height 11/25/16 1508 1.651 m (5\' 5" )     Head Circumference --      Peak Flow --      Pain Score --      Pain Loc --      Pain Edu? --      Excl. in [place]? --     Constitutional: Alert and oriented. moderate respiratory distress on CPAP upon arrival Eyes: Conjunctivae are normal.  Head: Atraumatic. Nose: No congestion/rhinnorhea. Mouth/Throat: Mucous membranes are moist. Neck: No stridor.  No meningeal signs.   Cardiovascular: mild tachycardia, regular rhythm. Good peripheral circulation. Grossly normal heart sounds. Respiratory: increased respiratory effort with intercostal retractions and accessory muscle usage.  Wheezing throughout and coarse breath sounds most notable in the bases.  Exam is somewhat limited by body habitus. speaking in short phrases.  Reports she feels better on BiPAP Gastrointestinal: obese. Soft and nontender. No distention.  Musculoskeletal: No lower extremity tenderness nor edema. No gross deformities of extremities. Neurologic:  Normal speech and language. No gross focal neurologic deficits are appreciated.  Skin:  Skin is warm, dry and intact. No rash noted. Psychiatric: Mood and affect are normal. Speech and behavior are normal.  ____________________________________________   LABS (all labs ordered are listed, but only abnormal results are displayed)  Labs Reviewed  COMPREHENSIVE METABOLIC PANEL - Abnormal; Notable for the following:       Result Value   Chloride 99 (*)    Glucose, Bld 202 (*)    BUN 24 (*)    Creatinine, Ser 2.01 (*)    GFR calc non Af Amer 23 (*)    GFR calc Af Amer 27 (*)    All other components within normal limits  BRAIN NATRIURETIC PEPTIDE - Abnormal; Notable for the following:    B Natriuretic Peptide 278.0 (*)    All other components within normal limits  LACTIC ACID, PLASMA - Abnormal; Notable for the following:    Lactic Acid,  Venous 2.1 (*)    All other components within normal limits  CBC WITH DIFFERENTIAL/PLATELET - Abnormal; Notable for the following:    RBC 3.35 (*)    Hemoglobin 9.2 (*)    HCT 28.7 (*)    RDW 20.4 (*)  All other components within normal limits  BLOOD GAS, ARTERIAL - Abnormal; Notable for the following:    pCO2 arterial 54 (*)    pO2, Arterial 57 (*)    Bicarbonate 34.2 (*)    Acid-Base Excess 8.4 (*)    Allens test (pass/fail) ARTERIAL DRAW (*)    All other components within normal limits  LIPASE, BLOOD  TROPONIN I  LACTIC ACID, PLASMA   ____________________________________________  EKG  ED ECG REPORT I, Jaiyanna Safran, the attending physician, personally viewed and interpreted this ECG.  Date: 11/25/2016 EKG Time: 15:40 Rate: 96 Rhythm: normal sinus rhythm QRS Axis: normal Intervals: normal ST/T Wave abnormalities: Non-specific ST segment / T-wave changes, but no evidence of acute ischemia. Narrative Interpretation: no evidence of acute ischemia  ____________________________________________  RADIOLOGY   Dg Chest Portable 1 View  Result Date: 11/25/2016 CLINICAL DATA:  Patient brought in via [facility] for shortness of breath. EMS started 18g peripheral IV, albuterol and Atrovent nebulizer's given, and CPAP applied. Patient alert and oriented upon arrival and able to answer questions off CPAP, history of CAD, CHF, COPD, diabetes, hypertension EXAM: PORTABLE CHEST 1 VIEW COMPARISON:  10/25/2016 FINDINGS: Cardiac silhouette is mildly enlarged. No mediastinal or hilar masses. No convincing adenopathy. Hazy opacity at the right lung base is likely prominent cardiophrenic angle fat. Lung base opacity has improved when compared to the prior exam likely due to decreased pleural effusions and improved atelectasis. Lungs are otherwise clear with no evidence of pneumonia or pulmonary edema. No pneumothorax. Skeletal structures are demineralized but grossly intact. IMPRESSION: No  acute cardiopulmonary disease. Electronically Signed   By: Lajean Manes M.D.   On: 11/25/2016 15:37    ____________________________________________   PROCEDURES  Critical Care performed: Yes, see critical care procedure note(s)   Procedure(s) performed:   .Critical Care Performed by: Hinda Kehr Authorized by: Hinda Kehr   Critical care provider statement:    Critical care time (minutes):  45   Critical care time was exclusive of:  Separately billable procedures and treating other patients   Critical care was necessary to treat or prevent imminent or life-threatening deterioration of the following conditions:  Respiratory failure   Critical care was time spent personally by me on the following activities:  Development of treatment plan with patient or surrogate, discussions with consultants, evaluation of patient's response to treatment, examination of patient, obtaining history from patient or surrogate, ordering and performing treatments and interventions, ordering and review of laboratory studies, ordering and review of radiographic studies, pulse oximetry, re-evaluation of patient's condition and review of old charts      ____________________________________________   INITIAL IMPRESSION / [place] / ED COURSE  Pertinent labs & imaging results that were available during my care of the patient were reviewed by me and considered in my medical decision making (see chart for details).  starting patient immediately on BiPAP and will obtain an ABG to determine degree of hypoxia and/or hypercapnia.  Chest x-ray and labs pending including lactic acid.  I will avoid fluids at this time because I believe this is a mixed picture of COPD and CHF.  She does appear volume overloaded based on the edema in her legs but it is clearly some element of this is chronic based on her skin thickening in her own report.  I am treating with additional duo nebs and Solu-Medrol and will  await the results of the chest x-ray before starting any diuretic and/or antibiotics.  The patient is protecting  her airway and does not need intubation at this time.  Strongly doubt PE given to much more likely alternate diagnoses ( CHF and COPD).  I also doubt ACS although she may have a degree of demand ischemia associated with the worsening respiratory status.   Clinical Course as of Nov 26 1807  Thu Nov 25, 2016  1609 Elevated lactate, but suspect respiratory status and overall acute illness rather than sepsis.  CXR essentially unchanged from prior.  Troponin normal.  Still awaiting BNP and CBC.  Holding off on fluids because I still strongly suspect volume elevation.  Lactic Acid, Venous: (!!) 2.1 [CF]  1623 No leukocytosis.  Will discuss with hospitalist.  Patient doing much better on bipap WBC: 9.1 [CF]    Clinical Course User Index [CF] Hinda Kehr, MD    ____________________________________________  FINAL CLINICAL IMPRESSION(S) / ED DIAGNOSES  Final diagnoses:  Acute on chronic respiratory failure with hypoxia and hypercapnia (HCC)  Elevated lactic acid level  Chronic renal impairment, unspecified CKD stage  COPD exacerbation (HCC)     MEDICATIONS GIVEN DURING THIS VISIT:  Medications  ipratropium-albuterol (DUONEB) 0.5-2.5 (3) MG/3ML nebulizer solution 9 mL (9 mLs Nebulization Given 11/25/16 1543)  methylPREDNISolone sodium succinate (SOLU-MEDROL) 125 mg/2 mL injection 125 mg (125 mg Intravenous Given 11/25/16 1630)     NEW OUTPATIENT MEDICATIONS STARTED DURING THIS VISIT:  New Prescriptions   No medications on file    Modified Medications   No medications on file    Discontinued Medications   AZITHROMYCIN (ZITHROMAX) 250 MG TABLET    Take 1 tablet (250 mg total) by mouth daily.   GLIPIZIDE (GLUCOTROL XL) 2.5 MG 24 HR TABLET    Take 2.5 mg by mouth daily.    IBUPROFEN (ADVIL,MOTRIN) 800 MG TABLET    Take 800 mg by mouth every 8 (eight) hours as needed for mild  pain.    OXYCODONE-ACETAMINOPHEN (PERCOCET) 10-325 MG TABLET    Take 1 tablet by mouth every 8 (eight) hours as needed for pain.      Note:  This document was prepared using Dragon voice recognition software and may include unintentional dictation errors.    Hinda Kehr, MD 11/25/16 [phone]

## 2016-11-25 NOTE — ED Notes (Addendum)
This RN to bedside at this time, no change in patient condition. Pt sitting up in bed on nasal cannula. Pt c/o feeling weak at this time. Pt requesting something to drink. Explained would have to check orders prior to giving her something to drink, pt states understanding. Will continue to monitor for further patient needs.

## 2016-11-25 NOTE — H&P (Signed)
[facility] [place] NAME: Mckenzie Taylor    MR#:  [id number]  DATE OF BIRTH:  1940/05/20  DATE OF ADMISSION:  11/25/2016  PRIMARY CARE PHYSICIAN: Denton Lank, MD   REQUESTING/REFERRING PHYSICIAN: dr Karma Greaser  CHIEF COMPLAINT:   SOB HISTORY OF PRESENT ILLNESS:  Mckenzie Taylor  is a 76 y.o. female with a known history of Chronic systolic heart failure ejection fraction 45-50%, chronic hypoxic respiratory failure on 3 L of oxygen due to COPD and diabetes who presents with above complaint. She has brought via EMS due to increasing shortness of breath, PND and orthopnea. In route she had albuterol and Atrovent nebulizers. When she arrived to the emergency room respiratory therapist place BiPAP due to increased respiratory rate and diffuse wheezing. She is currently on BiPAP machine. She has been given Lasix and steroids. Chest x-ray shows no infiltrate. Patient reports over the past 10 days she has had 17 pound weight and due to increased water intake. She is compliant with medications. She is followed by CHF nurse. She denies fever, chills. She has had a nonproductive cough over the past 2 days as well. She also reports PND and orthopnea with lower extremity edema.  PAST MEDICAL HISTORY:   Past Medical History:  Diagnosis Date  . Autosomal recessive polycystic kidneys   . Avascular necrosis of hip (HCC)    bilateral  . CAD (coronary artery disease)    mild  . CHF (congestive heart failure) ([place])   . Chronic kidney disease    cyst  . COPD (chronic obstructive pulmonary disease) ([place])   . Diabetes type 2, controlled ([place])   . GERD (gastroesophageal reflux disease)   . HTN (hypertension)   . Hypothyroidism    subclinical. low TSH, normal thyroid panel. biopsy 2010  . Vitamin B12 deficiency     PAST SURGICAL HISTORY:   Past Surgical History:  Procedure Laterality Date  . CHOLECYSTECTOMY    . hip replacement     bilateral-secondary to  avascular necrosis  . right eye lens replacement    . ULNAR NERVE REPAIR     bilateral  . VESICOVAGINAL FISTULA CLOSURE W/ TAH      SOCIAL HISTORY:   Social History  Substance Use Topics  . Smoking status: Former Smoker    Packs/day: 1.00    Years: 50.00  . Smokeless tobacco: Never Used     Comment: 1 ppd - 50 years   . Alcohol use No    FAMILY HISTORY:   Family History  Problem Relation Age of Onset  . Cancer Father        lung  . COPD Mother   . Heart disease Mother   . Heart disease Maternal Uncle     DRUG ALLERGIES:   Allergies  Allergen Reactions  . Other Rash    Pt reports allergy to metals.    REVIEW OF SYSTEMS:   Review of Systems  Constitutional: Negative.  Negative for chills, fever and malaise/fatigue.  HENT: Negative.  Negative for ear discharge, ear pain, hearing loss, nosebleeds and sore throat.   Eyes: Negative.  Negative for blurred vision and pain.  Respiratory: Positive for cough, shortness of breath and wheezing. Negative for hemoptysis.   Cardiovascular: Positive for orthopnea, leg swelling and PND. Negative for chest pain and palpitations.  Gastrointestinal: Negative.  Negative for abdominal pain, blood in stool, diarrhea, nausea and vomiting.  Genitourinary: Negative.  Negative for dysuria.  Musculoskeletal: Negative.  Negative  for back pain.  Skin: Negative.   Neurological: Negative for dizziness, tremors, speech change, focal weakness, seizures and headaches.  Endo/Heme/Allergies: Negative.  Does not bruise/bleed easily.  Psychiatric/Behavioral: Negative.  Negative for depression, hallucinations and suicidal ideas.    MEDICATIONS AT HOME:   Prior to Admission medications   Medication Sig Start Date End Date Taking? Authorizing Provider  albuterol (PROVENTIL) (2.5 MG/3ML) 0.083% nebulizer solution Take by nebulization every 4 (four) hours as needed for wheezing or shortness of breath.   Yes [provider]  albuterol  (VENTOLIN HFA) 108 (90 BASE) MCG/ACT inhaler Inhale 2 puffs into the lungs every 4 (four) hours as needed.    Yes [provider]  amLODipine (NORVASC) 10 MG tablet Take 10 mg by mouth daily.     Yes [provider]  aspirin 81 MG tablet Take by mouth daily.   Yes [provider]  atorvastatin (LIPITOR) 40 MG tablet Take 40 mg by mouth daily.   Yes [provider]  Cholecalciferol (VITAMIN D3) 5000 units CAPS Take 1 capsule by mouth daily.   Yes [provider]  ciprofloxacin (CIPRO) 500 MG tablet Take 500 mg by mouth 3 (three) times daily.   Yes [provider]  Cyanocobalamin 1500 MCG TBDP Take 3 tablets by mouth daily.   Yes [provider]  enalapril (VASOTEC) 5 MG tablet Take 5 mg by mouth daily.   Yes [provider]  FLUoxetine (PROZAC) 40 MG capsule Take 40 mg by mouth.   Yes [provider]  Fluticasone-Salmeterol (ADVAIR) 250-50 MCG/DOSE AEPB Inhale 1 puff into the lungs 2 (two) times daily.   Yes [provider]  furosemide (LASIX) 40 MG tablet Take 40 mg by mouth daily. 04/06/16  Yes [provider]  gabapentin (NEURONTIN) 100 MG capsule Take 200 mg by mouth 3 (three) times daily.   Yes [provider]  ipratropium-albuterol (DUONEB) 0.5-2.5 (3) MG/3ML SOLN Take 3 mLs by nebulization 4 (four) times daily. 03/28/16  Yes Vaughan Basta, MD  LEVEMIR FLEXTOUCH 100 UNIT/ML Pen Inject 25 Units into the skin at bedtime.  07/23/16  Yes [provider]  metoprolol tartrate (LOPRESSOR) 25 MG tablet Take 1 tablet (25 mg total) by mouth 2 (two) times daily. Patient taking differently: Take 25 mg by mouth daily.  08/19/16  Yes Vaughan Basta, MD  NOVOLOG FLEXPEN 100 UNIT/ML FlexPen Inject 2-10 Units into the skin 3 (three) times daily as needed. Sliding scale if blood sugar is above 200. 04/02/16  Yes [provider]  omeprazole (PRILOSEC) 20 MG capsule Take 20 mg by  mouth daily.   Yes [provider]  promethazine (PHENERGAN) 25 MG tablet Take 1 tablet by mouth daily. 08/16/14  Yes [provider]  sucralfate (CARAFATE) 1 g tablet Take 1 tablet (1 g total) by mouth 4 (four) times daily. 10/29/16 10/29/17 Yes Theodoro Grist, MD  tiotropium (SPIRIVA) 18 MCG inhalation capsule Place 18 mcg into inhaler and inhale daily.   Yes [provider]  tiZANidine (ZANAFLEX) 4 MG capsule Take 4 mg by mouth 3 (three) times daily as needed for muscle spasms.   Yes [provider]  traZODone (DESYREL) 50 MG tablet Take 50 mg by mouth at bedtime. 08/10/16  Yes [provider]  umeclidinium bromide (INCRUSE ELLIPTA) 62.5 MCG/INH AEPB Inhale 1 puff into the lungs daily.   Yes [provider]  predniSONE (STERAPRED UNI-PAK 21 TAB) 10 MG (21) TBPK tablet Please take 6 pills in  the morning on the day 1, then taper by one pill daily until finished, thank you Patient not taking: Reported on 11/25/2016 10/29/16   Theodoro Grist, MD      VITAL SIGNS:  Blood pressure (!) 150/77, pulse (!) 104, temperature 98.3 F (36.8 C), temperature source Axillary, resp. rate 20, height 5\' 5"  (1.651 m), weight 104.3 kg (230 lb), SpO2 100 %.  PHYSICAL EXAMINATION:   Physical Exam  Constitutional: She is oriented to person, place, and time. She appears distressed.  Moderate distress sitting up with BiPAP  HENT:  Head: Normocephalic.  Eyes: No scleral icterus.  Neck: Normal range of motion. Neck supple. No JVD present. No tracheal deviation present.  Cardiovascular: Normal rate, regular rhythm and normal heart sounds.  Exam reveals no gallop and no friction rub.   No murmur heard. Pulmonary/Chest: Effort normal. No respiratory distress. She has wheezes (Diffuse prolonged expiratory wheezing). She has no rales. She exhibits no tenderness.  Abdominal: Soft. Bowel sounds are normal. She exhibits no distension and no mass. There is no tenderness. There  is no rebound and no guarding.  Musculoskeletal: Normal range of motion. She exhibits edema.  Neurological: She is alert and oriented to person, place, and time.  Skin: Skin is warm. No rash noted. No erythema.  Psychiatric: Affect and judgment normal.      LABORATORY PANEL:   CBC  Recent Labs Lab 11/25/16 1512  WBC 9.1  HGB 9.2*  HCT 28.7*  PLT 317   ------------------------------------------------------------------------------------------------------------------  Chemistries   Recent Labs Lab 11/25/16 1512  NA 141  K 4.2  CL 99*  CO2 32  GLUCOSE 202*  BUN 24*  CREATININE 2.01*  CALCIUM 9.0  AST 15  ALT 14  ALKPHOS 65  BILITOT 0.6   ------------------------------------------------------------------------------------------------------------------  Cardiac Enzymes  Recent Labs Lab 11/25/16 1512  TROPONINI <0.03   ------------------------------------------------------------------------------------------------------------------  RADIOLOGY:  Dg Chest Portable 1 View  Result Date: 11/25/2016 CLINICAL DATA:  Patient brought in via [facility] for shortness of breath. EMS started 18g peripheral IV, albuterol and Atrovent nebulizer's given, and CPAP applied. Patient alert and oriented upon arrival and able to answer questions off CPAP, history of CAD, CHF, COPD, diabetes, hypertension EXAM: PORTABLE CHEST 1 VIEW COMPARISON:  10/25/2016 FINDINGS: Cardiac silhouette is mildly enlarged. No mediastinal or hilar masses. No convincing adenopathy. Hazy opacity at the right lung base is likely prominent cardiophrenic angle fat. Lung base opacity has improved when compared to the prior exam likely due to decreased pleural effusions and improved atelectasis. Lungs are otherwise clear with no evidence of pneumonia or pulmonary edema. No pneumothorax. Skeletal structures are demineralized but grossly intact. IMPRESSION: No acute cardiopulmonary disease. Electronically Signed   By:  Lajean Manes M.D.   On: 11/25/2016 15:37    EKG:  Normal sinus rhythm no ST elevation or depression  IMPRESSION AND PLAN:   76 year old female with chronic respiratory failure on 3 L of oxygen due to COPD, chronic systolic heart failure ejection fraction 45-50% and chronic kidney disease stage III who presents with tennis breath, wheezing, cough and lower extremity edema with weight gain.  1. Acute on chronic hypoxic respiratory failure due to COPD exacerbation and CHF exacerbation Continue BiPAP support I have contacted intensivist  2. Acute on chronic COPD exacerbation: Continue IV steroids Continue inhalers and DuoNeb's Wheezing to baseline 3 L of oxygen  3. Acute on chronic systolic heart failure ejection fraction 45-50%: Continue IV Lasix Monitor intake and output as well as daily  weights Continue metoprolol Patient needs to be referred to CHF clinic at discharge.   4. Diabetes: Sliding scale insulin Continue Lasix Monitor blood sugars carefully  5. GERD: Continue Carafate and PPI  6. Depression: Continue Prozac  7. Essential hypertension: Continue Vasotec and Norvasc with metoprolol  8. Hyperlipidemia: Continue atorvastatin  9. Anemia of chronic disease: Repeat CBC in a.m. and monitor No need for blood transfusion at this time.   10. Chronic kidney disease stage III: Creatinine is increased today due to CHF exacerbation I will continue enalapril and use Lasix for diuresis, however will need to repeat BMP in a.m.Marland Kitchen DC enalapril of creatinine increasing.  All the records are reviewed and case discussed with ED provider. Management plans discussed with the patient and she is in agreement  CODE STATUS: full  Critical careTOTAL TIME TAKING CARE OF THIS PATIENT: 45 minutes.    Lailah Marcelli M.D on 11/25/2016 at 4:59 PM  Between 7am to 6pm - Pager - [phone]  After 6pm go to [email or web address] - password [email or web address] [facility]  Office   [phone]  CC: Primary care physician; Denton Lank, MD

## 2016-11-25 NOTE — Progress Notes (Signed)
Pt taken off bipap placed on 4lpm [place], sats 96%, respiratory rate 20/min, Dr. Juanell Fairly aware, will continue to monitor

## 2016-11-25 NOTE — ED Notes (Signed)
Date and time results received: 11/25/16  4:13 PM  Test: Lactic Critical Value: 2.1  Name of Provider Notified: Dr. Hortense Ramal  Orders Received? Or Actions Taken?: Critical Result Acknowledged

## 2016-11-25 NOTE — ED Notes (Signed)
Pt ambulated to in rm BR, pt was stand by assist and tolerated ambulation well. Pt sitting on side of bed with call bell in place and notified this RN is calling report to inpatient rm, pt verbalizes understanding of this.

## 2016-11-25 NOTE — ED Notes (Signed)
Pt given sandwich tray, pt sitting on side of bed per her request, pt states improvement to breathing. Pt is on 3L at this time due to that is what she uses at home. Call bell at bedside.

## 2016-11-25 NOTE — ED Notes (Signed)
Pt resting in bed. No change in patient condition. Will continue to monitor for further patient needs. Apologized and explained delay to patient.

## 2016-11-25 NOTE — ED Notes (Signed)
Repeat lactic collected, pt remains on 2L via [place] at this time. Pt given diet sprite per her request. Will continue to monitor for further patient needs.

## 2016-11-25 NOTE — ED Triage Notes (Signed)
Patient brought in via [facility] for shortness of breath.  EMS started 18g peripheral IV, albuterol and Atrovent nebulizer's given, and CPAP applied.  Patient alert and oriented upon arrival and able to answer questions off CPAP.  Respiratory therapist placed patient on BiPAP.

## 2016-11-26 LAB — GLUCOSE, CAPILLARY
Glucose-Capillary: 557 mg/dL (ref 65–99)
Glucose-Capillary: 471 mg/dL — ABNORMAL HIGH (ref 65–99)
Glucose-Capillary: 348 mg/dL — ABNORMAL HIGH (ref 65–99)
Glucose-Capillary: 251 mg/dL — ABNORMAL HIGH (ref 65–99)
Glucose-Capillary: 201 mg/dL — ABNORMAL HIGH (ref 65–99)
Glucose-Capillary: 243 mg/dL — ABNORMAL HIGH (ref 65–99)

## 2016-11-26 LAB — BASIC METABOLIC PANEL
Anion gap: 14 (ref 5–15)
BUN: 30 mg/dL — ABNORMAL HIGH (ref 6–20)
CO2: 26 mmol/L (ref 22–32)
Calcium: 8.6 mg/dL — ABNORMAL LOW (ref 8.9–10.3)
Chloride: 97 mmol/L — ABNORMAL LOW (ref 101–111)
Creatinine, Ser: 2.12 mg/dL — ABNORMAL HIGH (ref 0.44–1.00)
GFR calc Af Amer: 25 mL/min — ABNORMAL LOW (ref >60)
GFR calc non Af Amer: 22 mL/min — ABNORMAL LOW (ref >60)
Glucose, Bld: 582 mg/dL (ref 65–99)
Potassium: 5.3 mmol/L — ABNORMAL HIGH (ref 3.5–5.1)
Sodium: 137 mmol/L (ref 135–145)

## 2016-11-26 LAB — CBC
HCT: 28.7 % — ABNORMAL LOW (ref 35.0–47.0)
Hemoglobin: 9.1 g/dL — ABNORMAL LOW (ref 12.0–16.0)
MCH: 27.7 pg (ref 26.0–34.0)
MCHC: 31.6 g/dL — ABNORMAL LOW (ref 32.0–36.0)
MCV: 87.6 fL (ref 80.0–100.0)
Platelets: 299 10*3/uL (ref 150–440)
RBC: 3.28 MIL/uL — ABNORMAL LOW (ref 3.80–5.20)
RDW: 20.6 % — ABNORMAL HIGH (ref 11.5–14.5)
WBC: 8.3 10*3/uL (ref 3.6–11.0)

## 2016-11-26 MED ORDER — HYDROCOD POLST-CPM POLST ER 10-8 MG/5ML PO SUER
5.0000 mL | Freq: Two times a day (BID) | ORAL | Status: DC | PRN
  Administered 2016-11-26 – 2016-11-27 (×2): 5 mL via ORAL
  Filled 2016-11-26 (×2): qty 5

## 2016-11-26 MED ORDER — GUAIFENESIN ER 600 MG PO TB12
600.0000 mg | ORAL_TABLET | Freq: Two times a day (BID) | ORAL | Status: DC
  Administered 2016-11-26 – 2016-11-27 (×3): 600 mg via ORAL
  Filled 2016-11-26 (×3): qty 1

## 2016-11-26 MED ORDER — INSULIN ASPART 100 UNIT/ML ~~LOC~~ SOLN
0.0000 [IU] | Freq: Three times a day (TID) | SUBCUTANEOUS | Status: DC
  Administered 2016-11-26: 8 [IU] via SUBCUTANEOUS
  Administered 2016-11-26 (×2): 5 [IU] via SUBCUTANEOUS
  Administered 2016-11-26: 09:00:00 11 [IU] via SUBCUTANEOUS
  Administered 2016-11-27: 09:00:00 2 [IU] via SUBCUTANEOUS
  Administered 2016-11-27: 12:00:00 3 [IU] via SUBCUTANEOUS
  Filled 2016-11-26 (×6): qty 1

## 2016-11-26 MED ORDER — INSULIN ASPART 100 UNIT/ML ~~LOC~~ SOLN
6.0000 [IU] | Freq: Three times a day (TID) | SUBCUTANEOUS | Status: DC
  Administered 2016-11-26 – 2016-11-27 (×5): 6 [IU] via SUBCUTANEOUS
  Filled 2016-11-26 (×5): qty 1

## 2016-11-26 MED ORDER — METHYLPREDNISOLONE SODIUM SUCC 125 MG IJ SOLR
60.0000 mg | Freq: Every day | INTRAMUSCULAR | Status: DC
  Administered 2016-11-27: 60 mg via INTRAVENOUS
  Filled 2016-11-26: qty 2

## 2016-11-26 MED ORDER — INSULIN ASPART 100 UNIT/ML ~~LOC~~ SOLN
16.0000 [IU] | Freq: Once | SUBCUTANEOUS | Status: AC
  Administered 2016-11-26: 16 [IU] via SUBCUTANEOUS
  Filled 2016-11-26: qty 1

## 2016-11-26 MED ORDER — INSULIN ASPART 100 UNIT/ML ~~LOC~~ SOLN: 0.0000 [IU] | Freq: Three times a day (TID) | SUBCUTANEOUS | Status: DC

## 2016-11-26 MED ORDER — INSULIN ASPART 100 UNIT/ML ~~LOC~~ SOLN: 6.0000 [IU] | Freq: Every day | SUBCUTANEOUS | Status: DC

## 2016-11-26 MED ORDER — ENOXAPARIN SODIUM 30 MG/0.3ML ~~LOC~~ SOLN
30.0000 mg | SUBCUTANEOUS | Status: DC
  Administered 2016-11-26: 21:00:00 30 mg via SUBCUTANEOUS
  Filled 2016-11-26: qty 0.3

## 2016-11-26 MED ORDER — ORAL CARE MOUTH RINSE
15.0000 mL | Freq: Two times a day (BID) | OROMUCOSAL | Status: DC
  Administered 2016-11-26 – 2016-11-27 (×3): 15 mL via OROMUCOSAL

## 2016-11-26 MED ORDER — FUROSEMIDE 40 MG PO TABS: 40.0000 mg | ORAL_TABLET | Freq: Every day | ORAL | Status: DC

## 2016-11-26 MED ORDER — INSULIN DETEMIR 100 UNIT/ML ~~LOC~~ SOLN
30.0000 [IU] | Freq: Every day | SUBCUTANEOUS | Status: DC
  Administered 2016-11-26: 30 [IU] via SUBCUTANEOUS
  Filled 2016-11-26 (×2): qty 0.3

## 2016-11-26 NOTE — Progress Notes (Signed)
Initial HF Clinic appointment scheduled on December 02, 2016 at 11:00am.   Of note, she did not show for a previous appointment on November 04, 2016

## 2016-11-26 NOTE — Progress Notes (Signed)
Order for enoxaparin 40 mg subcutaneously daily changed to 30 mg daily dose per protocol for CrCl < 30 mL/min.  Lenis Noon, PharmD 11/26/16 9:53 AM

## 2016-11-26 NOTE — Progress Notes (Signed)
Attempted to place patient on bipap. She was not ready to go on at this time. Mckenzie Taylor will have rn to call when she is ready

## 2016-11-26 NOTE — Care Management Note (Signed)
Case Management Note  Patient Details  Name: Mckenzie Taylor MRN: [id number] Date of Birth: 07-04-1940  Subjective/Objective:     Admitted to [facility] with the diagnosis of COPD. Lives with son, [name] [phone]). Last seen Dr. Posey Pronto 2 weeks ago. Prescriptions are filled at [facility]. Currently receiving home health with [facility]. [facility] in the past. No skilled nursing. Home oxygen 3 liters per nasal cannula continuous x 10 years from [place]. Rolling walker, cane, wheelchair, 2 bedside commodes, and grab bars in the home. Takes care of all basic activities of daily living herself. Fell last Saturday. Appetite O.K  Family will transport               Action/Plan: Will need to resume home health orders with [facility] when discharged   Expected Discharge Date:                  Expected Discharge Plan:     In-House Referral:     Discharge planning Services     Post Acute Care Choice:    Choice offered to:     DME Arranged:    DME Agency:     HH Arranged:    [place] Agency:     Status of Service:     If discussed at [employer], dates discussed:    Additional Comments:  Shelbie Ammons, RN MSN CCM Care Management [phone] 11/26/2016, 12:17 PM

## 2016-11-26 NOTE — Progress Notes (Signed)
Patient tolerated svn treatment well. Patient has refused bipap again for the night. States she is breathing good with just her oxygen

## 2016-11-26 NOTE — Plan of Care (Addendum)
Problem: Education: Goal: Knowledge of [facility] Education information/materials will improve Outcome: Progressing VSS, free of falls during shift.  Oriented to unit.  Hyperglycemic @ evening CBG check, > 500, Dr. Ara Kussmaul paged.  HS SSI added, but order start time 9/14 0800.  Dr. Ara Kussmaul paged again, changed order start time, but order for BG >400 to call MD.  Dr. Ara Kussmaul paged for one-time insulin dose, no response.  Dr. Estanislado Pandy returned page, pt received ordered one-time 16u Novolog, HS 6u Novolog added.  Denies pain.  Ambulated to bathroom during shift, tolerated well.  No other needs since arriving to floor.  Bed in low position, call bell within reach.  Refuses bed alarm, states she will always call out before exiting bed.  A+O x4, compliant w/ calling out when needed.  WCTM.

## 2016-11-26 NOTE — Progress Notes (Signed)
Advance care planning  Discussed with patient regarding her COPD, chronic respiratory failure, chronic systolic CHF and admission for worsening respiratory status. Patient understands that COPD Will get worse with time. She wants her son to be her healthcare power of attorney if she is unable to make decisions. We discussed regarding her CODE STATUS and patient has requested that she be DO NOT RESUSCITATE. Order is entered.  Time spent 20 minutes

## 2016-11-26 NOTE — Progress Notes (Addendum)
[facility] at [place] NAME: Charvi Gammage    MR#:  [id number]  DATE OF BIRTH:  12/03/1940  SUBJECTIVE:  CHIEF COMPLAINT:   Chief Complaint  Patient presents with  . Shortness of Breath   Still has shortness of breath and dry cough. No chest pain. Chronic orthopnea is unchanged. Chronic lower extremity edema.  REVIEW OF SYSTEMS:    Review of Systems  Constitutional: Positive for malaise/fatigue. Negative for chills and fever.  HENT: Negative for sore throat.   Eyes: Negative for blurred vision, double vision and pain.  Respiratory: Positive for cough, shortness of breath and wheezing. Negative for hemoptysis.   Cardiovascular: Positive for orthopnea and leg swelling. Negative for chest pain and palpitations.  Gastrointestinal: Negative for abdominal pain, constipation, diarrhea, heartburn, nausea and vomiting.  Genitourinary: Negative for dysuria and hematuria.  Musculoskeletal: Negative for back pain and joint pain.  Skin: Negative for rash.  Neurological: Positive for weakness. Negative for sensory change, speech change, focal weakness and headaches.  Endo/Heme/Allergies: Does not bruise/bleed easily.  Psychiatric/Behavioral: Negative for depression. The patient is not nervous/anxious.    DRUG ALLERGIES:   Allergies  Allergen Reactions  . Other Rash    Pt reports allergy to metals.    VITALS:  Blood pressure (!) 157/81, pulse (!) 113, temperature 98.3 F (36.8 C), temperature source Oral, resp. rate 20, height 5\' 5"  (1.651 m), weight 102.5 kg (226 lb), SpO2 96 %.  PHYSICAL EXAMINATION:   Physical Exam  GENERAL:  76 y.o.-year-old patient lying in the bed.conversational dyspnea. EYES: Pupils equal, round, reactive to light and accommodation. No scleral icterus. Extraocular muscles intact.  HEENT: Head atraumatic, normocephalic. Oropharynx and nasopharynx clear.  NECK:  Supple, no jugular venous distention. No thyroid  enlargement, no tenderness.  LUNGS:ilateral wheezing and decreased air entry CARDIOVASCULAR: S1, S2 normal. No murmurs, rubs, or gallops.  ABDOMEN: Soft, nontender, nondistended. Bowel sounds present. No organomegaly or mass.  EXTREMITIES: bilateral lower extremity edema NEUROLOGIC: Cranial nerves II through XII are intact. No focal Motor or sensory deficits b/l.   PSYCHIATRIC: The patient is alert and oriented x 3.  SKIN: No obvious rash, lesion, or ulcer.   LABORATORY PANEL:   CBC  Recent Labs Lab 11/26/16 0000  WBC 8.3  HGB 9.1*  HCT 28.7*  PLT 299   ------------------------------------------------------------------------------------------------------------------ Chemistries   Recent Labs Lab 11/25/16 1512 11/26/16 0000  NA 141 137  K 4.2 5.3*  CL 99* 97*  CO2 32 26  GLUCOSE 202* 582*  BUN 24* 30*  CREATININE 2.01* 2.12*  CALCIUM 9.0 8.6*  AST 15  --   ALT 14  --   ALKPHOS 65  --   BILITOT 0.6  --    ------------------------------------------------------------------------------------------------------------------  Cardiac Enzymes  Recent Labs Lab 11/25/16 1512  TROPONINI <0.03   ------------------------------------------------------------------------------------------------------------------  RADIOLOGY:  Dg Chest Portable 1 View  Result Date: 11/25/2016 CLINICAL DATA:  Patient brought in via [facility] for shortness of breath. EMS started 18g peripheral IV, albuterol and Atrovent nebulizer's given, and CPAP applied. Patient alert and oriented upon arrival and able to answer questions off CPAP, history of CAD, CHF, COPD, diabetes, hypertension EXAM: PORTABLE CHEST 1 VIEW COMPARISON:  10/25/2016 FINDINGS: Cardiac silhouette is mildly enlarged. No mediastinal or hilar masses. No convincing adenopathy. Hazy opacity at the right lung base is likely prominent cardiophrenic angle fat. Lung base opacity has improved when compared to the prior exam likely due to  decreased pleural effusions  and improved atelectasis. Lungs are otherwise clear with no evidence of pneumonia or pulmonary edema. No pneumothorax. Skeletal structures are demineralized but grossly intact. IMPRESSION: No acute cardiopulmonary disease. Electronically Signed   By: Lajean Manes M.D.   On: 11/25/2016 15:37   ASSESSMENT AND PLAN:   76 year old female with chronic respiratory failure on 3 L of oxygen due to COPD, chronic systolic heart failure ejection fraction 45-50% and chronic kidney disease stage III who presents with tennis breath, wheezing, cough and lower extremity edema with weight gain.  * Acute on chronic respiratory failure due to COPD exacerbation -IV steroids, Antibiotics - Scheduled Nebulizers - Inhalers -Wean O2 as tolerated - Consult pulmonary if no improvement  * Uncontrolled diabetes mellitus with severe hyperglycemia due to IV steroids. Stat dose of NovoLog. We'll increase dose of Lantus.Reduce dose of IV steroids. High risk for DKA.  * chronic systolic CHF with ejection fraction 45%. No significant fluid overload. Hold Lasix due to worsening creatine. Continue other home medications.  * GERD: Continue Carafate and PPI  * Essential hypertension: Continue Vasotec and Norvasc with metoprolol  * Hyperlipidemia: Continue atorvastatin  * Anemia of chronic disease stable  * Acute kidney injury over CKD stage III due to diuresis. Hold lasix.  All the records are reviewed and case discussed with Care Management/Social Workerr. Management plans discussed with the patient, family and they are in agreement.  CODE STATUS: FULL CODE  DVT Prophylaxis: SCDs  TOTAL CC TIME TAKING CARE OF THIS PATIENT: 35 minutes.   POSSIBLE D/C IN 1-2 DAYS, DEPENDING ON CLINICAL CONDITION.  Hillary Bow R M.D on 11/26/2016 at 11:45 AM  Between 7am to 6pm - Pager - [phone]  After 6pm go to [email or web address] - password [email or web address] [facility]  Office   [phone]  CC: Primary care physician; Denton Lank, MD  Note: This dictation was prepared with Dragon dictation along with smaller phrase technology. Any transcriptional errors that result from this process are unintentional.

## 2016-11-27 LAB — GLUCOSE, CAPILLARY
Glucose-Capillary: 134 mg/dL — ABNORMAL HIGH (ref 65–99)
Glucose-Capillary: 164 mg/dL — ABNORMAL HIGH (ref 65–99)

## 2016-11-27 LAB — BASIC METABOLIC PANEL
Anion gap: 8 (ref 5–15)
BUN: 38 mg/dL — ABNORMAL HIGH (ref 6–20)
CO2: 34 mmol/L — ABNORMAL HIGH (ref 22–32)
Calcium: 9 mg/dL (ref 8.9–10.3)
Chloride: 100 mmol/L — ABNORMAL LOW (ref 101–111)
Creatinine, Ser: 1.68 mg/dL — ABNORMAL HIGH (ref 0.44–1.00)
GFR calc Af Amer: 33 mL/min — ABNORMAL LOW (ref >60)
GFR calc non Af Amer: 29 mL/min — ABNORMAL LOW (ref >60)
Glucose, Bld: 131 mg/dL — ABNORMAL HIGH (ref 65–99)
Potassium: 4.3 mmol/L (ref 3.5–5.1)
Sodium: 142 mmol/L (ref 135–145)

## 2016-11-27 MED ORDER — ALUM & MAG HYDROXIDE-SIMETH 200-200-20 MG/5ML PO SUSP
30.0000 mL | Freq: Four times a day (QID) | ORAL | Status: DC | PRN
  Administered 2016-11-27: 05:00:00 30 mL via ORAL
  Filled 2016-11-27: qty 30

## 2016-11-27 MED ORDER — SODIUM CHLORIDE 0.9% FLUSH: 3.0000 mL | INTRAVENOUS | Status: DC | PRN

## 2016-11-27 MED ORDER — ORAL CARE MOUTH RINSE: 15.0000 mL | Freq: Two times a day (BID) | OROMUCOSAL | Status: DC

## 2016-11-27 MED ORDER — SODIUM CHLORIDE 0.9% FLUSH
3.0000 mL | Freq: Two times a day (BID) | INTRAVENOUS | Status: DC
Start: 2016-11-27 — End: 2016-11-27
  Administered 2016-11-27: 09:00:00 3 mL via INTRAVENOUS

## 2016-11-27 MED ORDER — PREDNISONE 10 MG (21) PO TBPK: ORAL_TABLET | ORAL | 0 refills | Status: DC

## 2016-11-27 MED ORDER — LEVOFLOXACIN 250 MG PO TABS: 250.0000 mg | ORAL_TABLET | Freq: Every day | ORAL | 0 refills | Status: DC

## 2016-11-27 MED ORDER — FUROSEMIDE 40 MG PO TABS
40.0000 mg | ORAL_TABLET | Freq: Two times a day (BID) | ORAL | Status: DC
  Administered 2016-11-27: 40 mg via ORAL
  Filled 2016-11-27: qty 1

## 2016-11-27 NOTE — Progress Notes (Signed)
Pt transported to private vehicle with 02@3l /[place] with discharge home to self/family care.

## 2016-11-27 NOTE — Progress Notes (Signed)
Reports she is feeling better. Ambulates to BR with SB+ and tolerates well. VSS. Eating well. Pulse ox stable with chronic 02 @3l /[place] which is pt's baseline. Lungs clear/diminished. Sitting up on side of bed. Oral and written discharge instructions done with 2 prescriptions and yellow DNR form in packet with stated understanding. Pt's own med returned (phenergan).

## 2016-11-27 NOTE — Discharge Instructions (Addendum)
Heart Failure Clinic appointment on December 02 2016 at 11:00am with Darylene Price, [place]. Please call [phone] to reschedule.   Resume diet, activity and oxygen as before

## 2016-11-29 NOTE — Discharge Summary (Signed)
[facility] [place] NAME: Mckenzie Taylor    MR#:  [id number]  DATE OF BIRTH:  01/05/1941  DATE OF ADMISSION:  11/25/2016 ADMITTING PHYSICIAN: Bettey Costa, MD  DATE OF DISCHARGE: 11/27/2016 12:31 PM  PRIMARY CARE PHYSICIAN: Denton Lank, MD   ADMISSION DIAGNOSIS:  COPD exacerbation ([place]) [J44.1] Elevated lactic acid level [R79.89] Acute on chronic respiratory failure with hypoxia and hypercapnia (HCC) [J96.21, J96.22] Chronic renal impairment, unspecified CKD stage [N18.9] COPD (chronic obstructive pulmonary disease) ([place]) [J44.9]  DISCHARGE DIAGNOSIS:  Active Problems:   COPD (chronic obstructive pulmonary disease) ([place])   SECONDARY DIAGNOSIS:   Past Medical History:  Diagnosis Date  . Autosomal recessive polycystic kidneys   . Avascular necrosis of hip (HCC)    bilateral  . CAD (coronary artery disease)    mild  . CHF (congestive heart failure) ([place])   . Chronic kidney disease    cyst  . COPD (chronic obstructive pulmonary disease) ([place])   . Diabetes type 2, controlled ([place])   . GERD (gastroesophageal reflux disease)   . HTN (hypertension)   . Hypothyroidism    subclinical. low TSH, normal thyroid panel. biopsy 2010  . Vitamin B12 deficiency      ADMITTING HISTORY  HISTORY OF PRESENT ILLNESS:  [name]  is a 76 y.o. female with a known history of Chronic systolic heart failure ejection fraction 45-50%, chronic hypoxic respiratory failure on 3 L of oxygen due to COPD and diabetes who presents with above complaint. She has brought via EMS due to increasing shortness of breath, PND and orthopnea. In route she had albuterol and Atrovent nebulizers. When she arrived to the emergency room respiratory therapist place BiPAP due to increased respiratory rate and diffuse wheezing. She is currently on BiPAP machine. She has been given Lasix and steroids. Chest x-ray shows no infiltrate. Patient reports over the past 10 days she  has had 17 pound weight and due to increased water intake. She is compliant with medications. She is followed by CHF nurse. She denies fever, chills. She has had a nonproductive cough over the past 2 days as well. She also reports PND and orthopnea with lower extremity edema.   HOSPITAL COURSE:   76 year old female with chronic respiratory failure on 3 L of oxygen due to COPD, chronic systolic heart failure ejection fraction 45-50% and chronic kidney disease stage III who presents with tennis breath, wheezing, cough and lower extremity edema with weight gain.  * Acute on chronic respiratory failure due to COPD exacerbation -IV steroids, Antibiotics. Change to oral at discharge - Scheduled Nebulizers - patient on 3 status oxygen on day of discharge Discussed regarding pulmonary referral. She is wanting to follow-up with her primary care physician and get referred to pulmonary from her PCP office.  * Uncontrolled diabetes mellitus with severe hyperglycemia due to IV steroids.  Blood sugars are improved. Needed higher dose of NovoLog in the hospital.  * chronic systolic CHF with ejection fraction 45%. No significant fluid overload. Hold Lasix due to worsening creatine. Continue other home medications.  * GERD: Continue Carafate and PPI  * Essential hypertension: Continue Vasotec and Norvasc with metoprolol  * Hyperlipidemia: Continue atorvastatin  * Anemia of chronic disease stable  * Acute kidney injury over CKD stage III due to diuresis. Improved back to normal. Resume Lasix at discharge  Patient stable for discharge home. Continue home oxygen at 3 L. Follow-up with primary care physician.  CONSULTS OBTAINED:  DRUG ALLERGIES:   Allergies  Allergen Reactions  . Other Rash    Pt reports allergy to metals.    DISCHARGE MEDICATIONS:   Discharge Medication List as of 11/27/2016 11:22 AM    START taking these medications   Details  levofloxacin (LEVAQUIN) 250 MG  tablet Take 1 tablet (250 mg total) by mouth daily., Starting Sat 11/27/2016, Print      CONTINUE these medications which have CHANGED   Details  predniSONE (STERAPRED UNI-PAK 21 TAB) 10 MG (21) TBPK tablet Please take 6 pills in the morning on the day 1, then taper by one pill daily until finished, Print      CONTINUE these medications which have NOT CHANGED   Details  albuterol (PROVENTIL) (2.5 MG/3ML) 0.083% nebulizer solution Take by nebulization every 4 (four) hours as needed for wheezing or shortness of breath., Historical Med    albuterol (VENTOLIN HFA) 108 (90 BASE) MCG/ACT inhaler Inhale 2 puffs into the lungs every 4 (four) hours as needed. , Historical Med    amLODipine (NORVASC) 10 MG tablet Take 10 mg by mouth daily.  , Historical Med    aspirin 81 MG tablet Take by mouth daily., Historical Med    atorvastatin (LIPITOR) 40 MG tablet Take 40 mg by mouth daily., Historical Med    Cholecalciferol (VITAMIN D3) 5000 units CAPS Take 1 capsule by mouth daily., Historical Med    Cyanocobalamin 1500 MCG TBDP Take 3 tablets by mouth daily., Historical Med    enalapril (VASOTEC) 5 MG tablet Take 5 mg by mouth daily., Historical Med    FLUoxetine (PROZAC) 40 MG capsule Take 40 mg by mouth., Historical Med    Fluticasone-Salmeterol (ADVAIR) 250-50 MCG/DOSE AEPB Inhale 1 puff into the lungs 2 (two) times daily., Historical Med    furosemide (LASIX) 40 MG tablet Take 40 mg by mouth daily., Starting Tue 04/06/2016, Historical Med    gabapentin (NEURONTIN) 100 MG capsule Take 200 mg by mouth 3 (three) times daily., Historical Med    ipratropium-albuterol (DUONEB) 0.5-2.5 (3) MG/3ML SOLN Take 3 mLs by nebulization 4 (four) times daily., Starting Sun 03/28/2016, Print    LEVEMIR FLEXTOUCH 100 UNIT/ML Pen Inject 25 Units into the skin at bedtime. , Starting Fri 07/23/2016, Historical Med    metoprolol tartrate (LOPRESSOR) 25 MG tablet Take 1 tablet (25 mg total) by mouth 2 (two) times  daily., Starting Thu 08/19/2016, Print    NOVOLOG FLEXPEN 100 UNIT/ML FlexPen Inject 2-10 Units into the skin 3 (three) times daily as needed. Sliding scale if blood sugar is above 200., Starting Fri 04/02/2016, Historical Med    omeprazole (PRILOSEC) 20 MG capsule Take 20 mg by mouth daily., Historical Med    promethazine (PHENERGAN) 25 MG tablet Take 1 tablet by mouth daily., Starting Fri 08/16/2014, Historical Med    sucralfate (CARAFATE) 1 g tablet Take 1 tablet (1 g total) by mouth 4 (four) times daily., Starting Fri 10/29/2016, Until Sat 10/29/2017, Normal    tiotropium (SPIRIVA) 18 MCG inhalation capsule Place 18 mcg into inhaler and inhale daily., Historical Med    tiZANidine (ZANAFLEX) 4 MG capsule Take 4 mg by mouth 3 (three) times daily as needed for muscle spasms., Historical Med    traZODone (DESYREL) 50 MG tablet Take 50 mg by mouth at bedtime., Starting Tue 08/10/2016, Historical Med    umeclidinium bromide (INCRUSE ELLIPTA) 62.5 MCG/INH AEPB Inhale 1 puff into the lungs daily., Historical Med      STOP taking these medications  ciprofloxacin (CIPRO) 500 MG tablet         Today   VITAL SIGNS:  Blood pressure (!) 153/76, pulse (!) 101, temperature 98 F (36.7 C), temperature source Oral, resp. rate 20, height 5\' 5"  (1.651 m), weight 102.6 kg (226 lb 3.2 oz), SpO2 97 %.  I/O:  No intake or output data in the 24 hours ending 11/29/16 1430  PHYSICAL EXAMINATION:  Physical Exam  GENERAL:  76 y.o.-year-old patient lying in the bed with no acute distress. Morbidly obese LUNGS: mild expiratory wheezing CARDIOVASCULAR: S1, S2 normal. No murmurs, rubs, or gallops.  ABDOMEN: Soft, non-tender, non-distended. Bowel sounds present. No organomegaly or mass.  NEUROLOGIC: Moves all 4 extremities. PSYCHIATRIC: The patient is alert and oriented x 3.  SKIN: No obvious rash, lesion, or ulcer.   DATA REVIEW:   CBC  Recent Labs Lab 11/26/16 0000  WBC 8.3  HGB 9.1*  HCT 28.7*   PLT 299    Chemistries   Recent Labs Lab 11/25/16 1512  11/27/16 0451  NA 141  < > 142  K 4.2  < > 4.3  CL 99*  < > 100*  CO2 32  < > 34*  GLUCOSE 202*  < > 131*  BUN 24*  < > 38*  CREATININE 2.01*  < > 1.68*  CALCIUM 9.0  < > 9.0  AST 15  --   --   ALT 14  --   --   ALKPHOS 65  --   --   BILITOT 0.6  --   --   < > = values in this interval not displayed.  Cardiac Enzymes  Recent Labs Lab 11/25/16 1512  [place] <0.03    Microbiology Results  Results for orders placed or performed during the hospital encounter of 10/25/16  MRSA PCR Screening     Status: None   Collection Time: 10/25/16  5:31 PM  Result Value Ref Range Status   MRSA by PCR NEGATIVE NEGATIVE Final    Comment:        The GeneXpert MRSA Assay (FDA approved for NASAL specimens only), is one component of a comprehensive MRSA colonization surveillance program. It is not intended to diagnose MRSA infection nor to guide or monitor treatment for MRSA infections.     RADIOLOGY:  No results found.  Follow up with PCP in 1 week.  Management plans discussed with the patient, family and they are in agreement.  CODE STATUS:  Code Status History    Date Active Date Inactive Code Status Order ID Comments User Context   11/26/2016 10:03 AM 11/27/2016  3:45 PM DNR [id number]  Hillary Bow, MD Inpatient   11/25/2016  9:26 PM 11/26/2016 10:03 AM Full Code [id number]  Bettey Costa, MD Inpatient   10/25/2016  4:14 PM 10/29/2016  5:25 PM Full Code [id number]  Henreitta Leber, MD ED   08/14/2016 11:32 PM 08/19/2016  4:57 PM Full Code [id number]  Lance Coon, MD Inpatient   05/05/2016  7:47 AM 05/07/2016  6:04 PM Full Code [id number]  Flora Lipps, MD ED   03/24/2016  2:16 PM 03/28/2016  2:24 PM Full Code [id number]  Awilda Bill, NP ED    Questions for Most Recent Historical Code Status (Order [id number])    Question Answer Comment   In the event of cardiac or respiratory ARREST Do not call a "code blue"    In the  event of cardiac or respiratory ARREST Do not perform Intubation, CPR, defibrillation or ACLS  In the event of cardiac or respiratory ARREST Use medication by any route, position, wound care, and other measures to relive pain and suffering. May use oxygen, suction and manual treatment of airway obstruction as needed for comfort.       TOTAL TIME TAKING CARE OF THIS PATIENT ON DAY OF DISCHARGE: more than 30 minutes.   Hillary Bow R M.D on 11/29/2016 at 2:30 PM  Between 7am to 6pm - Pager - [phone]  After 6pm go to [email or web address] - password [email or web address] [facility]  Office  [phone]  CC: Primary care physician; Denton Lank, MD  Note: This dictation was prepared with Dragon dictation along with smaller phrase technology. Any transcriptional errors that result from this process are unintentional.

## 2016-12-02 ENCOUNTER — Ambulatory Visit: Payer: Medicare Other | Admitting: Family

## 2016-12-14 ENCOUNTER — Telehealth: Payer: Self-pay | Admitting: Family

## 2016-12-14 ENCOUNTER — Ambulatory Visit: Payer: Medicare Other | Admitting: Family

## 2016-12-14 NOTE — Telephone Encounter (Signed)
Patient missed her initial appointment at the [facility] on 12/14/16. Will attempt to reschedule.

## 2017-01-06 ENCOUNTER — Inpatient Hospital Stay
Admission: EM | Admit: 2017-01-06 | Discharge: 2017-01-08 | DRG: 291 | Disposition: A | Discharge status: Home / Self Care | Payer: Medicare Other | Attending: Internal Medicine | Admitting: Internal Medicine

## 2017-01-06 ENCOUNTER — Emergency Department: Payer: Medicare Other

## 2017-01-06 ENCOUNTER — Encounter: Payer: Self-pay | Admitting: Emergency Medicine

## 2017-01-06 DIAGNOSIS — E039 Hypothyroidism, unspecified: Secondary | ICD-10-CM | POA: Diagnosis present

## 2017-01-06 DIAGNOSIS — J209 Acute bronchitis, unspecified: Secondary | ICD-10-CM | POA: Diagnosis present

## 2017-01-06 DIAGNOSIS — Z794 Long term (current) use of insulin: Secondary | ICD-10-CM | POA: Diagnosis not present

## 2017-01-06 DIAGNOSIS — Z91048 Other nonmedicinal substance allergy status: Secondary | ICD-10-CM | POA: Diagnosis not present

## 2017-01-06 DIAGNOSIS — Z96643 Presence of artificial hip joint, bilateral: Secondary | ICD-10-CM | POA: Diagnosis present

## 2017-01-06 DIAGNOSIS — Z6837 Body mass index (BMI) 37.0-37.9, adult: Secondary | ICD-10-CM

## 2017-01-06 DIAGNOSIS — Z87891 Personal history of nicotine dependence: Secondary | ICD-10-CM

## 2017-01-06 DIAGNOSIS — I2510 Atherosclerotic heart disease of native coronary artery without angina pectoris: Secondary | ICD-10-CM | POA: Diagnosis present

## 2017-01-06 DIAGNOSIS — J9612 Chronic respiratory failure with hypercapnia: Secondary | ICD-10-CM | POA: Diagnosis present

## 2017-01-06 DIAGNOSIS — Z9981 Dependence on supplemental oxygen: Secondary | ICD-10-CM

## 2017-01-06 DIAGNOSIS — J441 Chronic obstructive pulmonary disease with (acute) exacerbation: Secondary | ICD-10-CM | POA: Diagnosis present

## 2017-01-06 DIAGNOSIS — E669 Obesity, unspecified: Secondary | ICD-10-CM | POA: Diagnosis present

## 2017-01-06 DIAGNOSIS — Z7951 Long term (current) use of inhaled steroids: Secondary | ICD-10-CM | POA: Diagnosis not present

## 2017-01-06 DIAGNOSIS — J440 Chronic obstructive pulmonary disease with acute lower respiratory infection: Secondary | ICD-10-CM | POA: Diagnosis present

## 2017-01-06 DIAGNOSIS — D638 Anemia in other chronic diseases classified elsewhere: Secondary | ICD-10-CM | POA: Diagnosis present

## 2017-01-06 DIAGNOSIS — D631 Anemia in chronic kidney disease: Secondary | ICD-10-CM | POA: Diagnosis present

## 2017-01-06 DIAGNOSIS — Z79899 Other long term (current) drug therapy: Secondary | ICD-10-CM

## 2017-01-06 DIAGNOSIS — I509 Heart failure, unspecified: Secondary | ICD-10-CM

## 2017-01-06 DIAGNOSIS — I130 Hypertensive heart and chronic kidney disease with heart failure and stage 1 through stage 4 chronic kidney disease, or unspecified chronic kidney disease: Secondary | ICD-10-CM | POA: Diagnosis present

## 2017-01-06 DIAGNOSIS — K219 Gastro-esophageal reflux disease without esophagitis: Secondary | ICD-10-CM | POA: Diagnosis present

## 2017-01-06 DIAGNOSIS — J9611 Chronic respiratory failure with hypoxia: Secondary | ICD-10-CM | POA: Diagnosis present

## 2017-01-06 DIAGNOSIS — E1122 Type 2 diabetes mellitus with diabetic chronic kidney disease: Secondary | ICD-10-CM | POA: Diagnosis present

## 2017-01-06 DIAGNOSIS — Z66 Do not resuscitate: Secondary | ICD-10-CM | POA: Diagnosis present

## 2017-01-06 DIAGNOSIS — Q6119 Other polycystic kidney, infantile type: Secondary | ICD-10-CM

## 2017-01-06 DIAGNOSIS — I5023 Acute on chronic systolic (congestive) heart failure: Secondary | ICD-10-CM | POA: Diagnosis present

## 2017-01-06 DIAGNOSIS — N183 Chronic kidney disease, stage 3 (moderate): Secondary | ICD-10-CM | POA: Diagnosis present

## 2017-01-06 DIAGNOSIS — Q613 Polycystic kidney, unspecified: Secondary | ICD-10-CM | POA: Diagnosis not present

## 2017-01-06 LAB — CBC
HCT: 28.9 % — ABNORMAL LOW (ref 35.0–47.0)
Hemoglobin: 8.8 g/dL — ABNORMAL LOW (ref 12.0–16.0)
MCH: 26.6 pg (ref 26.0–34.0)
MCHC: 30.5 g/dL — ABNORMAL LOW (ref 32.0–36.0)
MCV: 87.3 fL (ref 80.0–100.0)
Platelets: 328 10*3/uL (ref 150–440)
RBC: 3.32 MIL/uL — ABNORMAL LOW (ref 3.80–5.20)
RDW: 20.1 % — ABNORMAL HIGH (ref 11.5–14.5)
WBC: 10.4 10*3/uL (ref 3.6–11.0)

## 2017-01-06 LAB — BASIC METABOLIC PANEL
Anion gap: 11 (ref 5–15)
BUN: 12 mg/dL (ref 6–20)
CO2: 33 mmol/L — ABNORMAL HIGH (ref 22–32)
Calcium: 8.6 mg/dL — ABNORMAL LOW (ref 8.9–10.3)
Chloride: 97 mmol/L — ABNORMAL LOW (ref 101–111)
Creatinine, Ser: 1.36 mg/dL — ABNORMAL HIGH (ref 0.44–1.00)
GFR calc Af Amer: 43 mL/min — ABNORMAL LOW (ref >60)
GFR calc non Af Amer: 37 mL/min — ABNORMAL LOW (ref >60)
Glucose, Bld: 218 mg/dL — ABNORMAL HIGH (ref 65–99)
Potassium: 3.5 mmol/L (ref 3.5–5.1)
Sodium: 141 mmol/L (ref 135–145)

## 2017-01-06 LAB — IRON AND TIBC
Iron: 20 ug/dL — ABNORMAL LOW (ref 28–170)
Saturation Ratios: 5 % — ABNORMAL LOW (ref 10.4–31.8)
TIBC: 428 ug/dL (ref 250–450)
UIBC: 408 ug/dL

## 2017-01-06 LAB — GLUCOSE, CAPILLARY
Glucose-Capillary: 318 mg/dL — ABNORMAL HIGH (ref 65–99)
Glucose-Capillary: 436 mg/dL — ABNORMAL HIGH (ref 65–99)
Glucose-Capillary: 455 mg/dL — ABNORMAL HIGH (ref 65–99)
Glucose-Capillary: 311 mg/dL — ABNORMAL HIGH (ref 65–99)

## 2017-01-06 LAB — FOLATE: Folate: 11.3 ng/mL

## 2017-01-06 LAB — TROPONIN I: Troponin I: <0.03 ng/mL (ref <0.03)

## 2017-01-06 LAB — HEMOGLOBIN A1C
Hgb A1c MFr Bld: 8.5 % — ABNORMAL HIGH (ref 4.8–5.6)
Mean Plasma Glucose: 197.25 mg/dL

## 2017-01-06 LAB — VITAMIN B12: Vitamin B-12: 657 pg/mL (ref 180–914)

## 2017-01-06 LAB — RETICULOCYTES
RBC.: 3.35 MIL/uL — ABNORMAL LOW (ref 3.80–5.20)
Retic Count, Absolute: 130.7 10*3/uL (ref 19.0–183.0)
Retic Ct Pct: 3.9 % — ABNORMAL HIGH (ref 0.4–3.1)

## 2017-01-06 LAB — FERRITIN: Ferritin: 18 ng/mL (ref 11–307)

## 2017-01-06 LAB — TSH: TSH: 2.791 u[IU]/mL (ref 0.350–4.500)

## 2017-01-06 MED ORDER — INSULIN DETEMIR 100 UNIT/ML ~~LOC~~ SOLN
25.0000 [IU] | Freq: Every day | SUBCUTANEOUS | Status: DC
  Administered 2017-01-06 – 2017-01-07 (×2): 25 [IU] via SUBCUTANEOUS
  Filled 2017-01-06 (×3): qty 0.25

## 2017-01-06 MED ORDER — ENALAPRIL MALEATE 10 MG PO TABS
5.0000 mg | ORAL_TABLET | Freq: Every day | ORAL | Status: DC
  Administered 2017-01-06 – 2017-01-08 (×3): 5 mg via ORAL
  Filled 2017-01-06 (×2): qty 0.5
  Filled 2017-01-06: qty 1

## 2017-01-06 MED ORDER — ONDANSETRON HCL 4 MG/2ML IJ SOLN
4.0000 mg | Freq: Four times a day (QID) | INTRAMUSCULAR | Status: DC | PRN
  Administered 2017-01-08: 4 mg via INTRAVENOUS
  Filled 2017-01-06: qty 2

## 2017-01-06 MED ORDER — MOMETASONE FURO-FORMOTEROL FUM 200-5 MCG/ACT IN AERO
2.0000 | INHALATION_SPRAY | Freq: Two times a day (BID) | RESPIRATORY_TRACT | Status: DC
  Administered 2017-01-06 – 2017-01-07 (×4): 2 via RESPIRATORY_TRACT
  Filled 2017-01-06: qty 8.8

## 2017-01-06 MED ORDER — ENOXAPARIN SODIUM 40 MG/0.4ML ~~LOC~~ SOLN
40.0000 mg | SUBCUTANEOUS | Status: DC
  Administered 2017-01-06 – 2017-01-07 (×2): 40 mg via SUBCUTANEOUS
  Filled 2017-01-06 (×2): qty 0.4

## 2017-01-06 MED ORDER — VITAMIN B-12 1000 MCG PO TABS
1500.0000 ug | ORAL_TABLET | Freq: Every day | ORAL | Status: DC
  Administered 2017-01-06 – 2017-01-08 (×3): 1500 ug via ORAL
  Filled 2017-01-06 (×2): qty 2

## 2017-01-06 MED ORDER — SODIUM CHLORIDE 0.9 % IV SOLN: 250.0000 mL | INTRAVENOUS | Status: DC | PRN

## 2017-01-06 MED ORDER — IPRATROPIUM-ALBUTEROL 0.5-2.5 (3) MG/3ML IN SOLN: 3.0000 mL | Freq: Four times a day (QID) | RESPIRATORY_TRACT | Status: DC

## 2017-01-06 MED ORDER — VITAMIN D3 25 MCG (1000 UNIT) PO TABS
5000.0000 [IU] | ORAL_TABLET | Freq: Every day | ORAL | Status: DC
  Administered 2017-01-06 – 2017-01-08 (×3): 5000 [IU] via ORAL
  Filled 2017-01-06 (×6): qty 5

## 2017-01-06 MED ORDER — DOXYCYCLINE HYCLATE 100 MG PO TABS
100.0000 mg | ORAL_TABLET | Freq: Two times a day (BID) | ORAL | Status: DC
  Administered 2017-01-06 – 2017-01-08 (×4): 100 mg via ORAL
  Filled 2017-01-06 (×4): qty 1

## 2017-01-06 MED ORDER — GABAPENTIN 100 MG PO CAPS
200.0000 mg | ORAL_CAPSULE | Freq: Three times a day (TID) | ORAL | Status: DC
  Administered 2017-01-06 – 2017-01-08 (×6): 200 mg via ORAL
  Filled 2017-01-06 (×6): qty 2

## 2017-01-06 MED ORDER — SODIUM CHLORIDE 0.9% FLUSH
3.0000 mL | Freq: Two times a day (BID) | INTRAVENOUS | Status: DC
  Administered 2017-01-06 – 2017-01-07 (×4): 3 mL via INTRAVENOUS

## 2017-01-06 MED ORDER — INSULIN REGULAR HUMAN 100 UNIT/ML IJ SOLN
5.0000 [IU] | Freq: Once | INTRAMUSCULAR | Status: AC
  Administered 2017-01-06: 5 [IU] via INTRAVENOUS
  Filled 2017-01-06 (×2): qty 0.05

## 2017-01-06 MED ORDER — HYDROCODONE-ACETAMINOPHEN 5-325 MG PO TABS
1.0000 | ORAL_TABLET | ORAL | Status: DC | PRN
  Administered 2017-01-06: 1 via ORAL
  Administered 2017-01-07 (×2): 2 via ORAL
  Filled 2017-01-06 (×2): qty 2
  Filled 2017-01-06: qty 1

## 2017-01-06 MED ORDER — ONDANSETRON HCL 4 MG PO TABS: 4.0000 mg | ORAL_TABLET | Freq: Four times a day (QID) | ORAL | Status: DC | PRN

## 2017-01-06 MED ORDER — DEXTROSE 5 % IV SOLN
100.0000 mg | Freq: Once | INTRAVENOUS | Status: AC
  Administered 2017-01-06: 100 mg via INTRAVENOUS
  Filled 2017-01-06 (×2): qty 100

## 2017-01-06 MED ORDER — ACETAMINOPHEN 325 MG PO TABS: 650.0000 mg | ORAL_TABLET | Freq: Four times a day (QID) | ORAL | Status: DC | PRN

## 2017-01-06 MED ORDER — ATORVASTATIN CALCIUM 20 MG PO TABS
40.0000 mg | ORAL_TABLET | Freq: Every day | ORAL | Status: DC
  Administered 2017-01-06 – 2017-01-07 (×2): 40 mg via ORAL
  Filled 2017-01-06 (×2): qty 2

## 2017-01-06 MED ORDER — ALBUTEROL SULFATE (2.5 MG/3ML) 0.083% IN NEBU
5.0000 mg | INHALATION_SOLUTION | Freq: Once | RESPIRATORY_TRACT | Status: AC
  Administered 2017-01-06: 5 mg via RESPIRATORY_TRACT
  Filled 2017-01-06: qty 6

## 2017-01-06 MED ORDER — METHYLPREDNISOLONE SODIUM SUCC 125 MG IJ SOLR
60.0000 mg | INTRAMUSCULAR | Status: DC
  Administered 2017-01-06 – 2017-01-07 (×2): 60 mg via INTRAVENOUS
  Filled 2017-01-06 (×2): qty 2

## 2017-01-06 MED ORDER — FUROSEMIDE 10 MG/ML IJ SOLN
40.0000 mg | Freq: Once | INTRAMUSCULAR | Status: AC
  Administered 2017-01-06: 40 mg via INTRAVENOUS
  Filled 2017-01-06: qty 4

## 2017-01-06 MED ORDER — TRAZODONE HCL 50 MG PO TABS
50.0000 mg | ORAL_TABLET | Freq: Every day | ORAL | Status: DC
  Administered 2017-01-06 – 2017-01-07 (×2): 50 mg via ORAL
  Filled 2017-01-06 (×2): qty 1

## 2017-01-06 MED ORDER — METOPROLOL TARTRATE 25 MG PO TABS
25.0000 mg | ORAL_TABLET | Freq: Every day | ORAL | Status: DC
  Administered 2017-01-06 – 2017-01-08 (×3): 25 mg via ORAL
  Filled 2017-01-06 (×3): qty 1

## 2017-01-06 MED ORDER — SODIUM CHLORIDE 0.9% FLUSH: 3.0000 mL | INTRAVENOUS | Status: DC | PRN

## 2017-01-06 MED ORDER — TIOTROPIUM BROMIDE MONOHYDRATE 18 MCG IN CAPS
18.0000 ug | ORAL_CAPSULE | Freq: Every day | RESPIRATORY_TRACT | Status: DC
  Administered 2017-01-06 – 2017-01-08 (×3): 18 ug via RESPIRATORY_TRACT
  Filled 2017-01-06: qty 5

## 2017-01-06 MED ORDER — AMLODIPINE BESYLATE 10 MG PO TABS
10.0000 mg | ORAL_TABLET | Freq: Every day | ORAL | Status: DC
  Administered 2017-01-06 – 2017-01-08 (×3): 10 mg via ORAL
  Filled 2017-01-06 (×3): qty 1

## 2017-01-06 MED ORDER — BISACODYL 5 MG PO TBEC: 5.0000 mg | DELAYED_RELEASE_TABLET | Freq: Every day | ORAL | Status: DC | PRN

## 2017-01-06 MED ORDER — FUROSEMIDE 10 MG/ML IJ SOLN
40.0000 mg | Freq: Two times a day (BID) | INTRAMUSCULAR | Status: DC
Start: 2017-01-06 — End: 2017-01-08
  Administered 2017-01-06 – 2017-01-07 (×3): 40 mg via INTRAVENOUS
  Filled 2017-01-06 (×3): qty 4

## 2017-01-06 MED ORDER — ACETAMINOPHEN 650 MG RE SUPP: 650.0000 mg | Freq: Four times a day (QID) | RECTAL | Status: DC | PRN

## 2017-01-06 MED ORDER — PANTOPRAZOLE SODIUM 40 MG PO TBEC
40.0000 mg | DELAYED_RELEASE_TABLET | Freq: Every day | ORAL | Status: DC
  Administered 2017-01-06 – 2017-01-07 (×2): 40 mg via ORAL
  Filled 2017-01-06 (×2): qty 1

## 2017-01-06 MED ORDER — SENNOSIDES-DOCUSATE SODIUM 8.6-50 MG PO TABS: 1.0000 | ORAL_TABLET | Freq: Every evening | ORAL | Status: DC | PRN

## 2017-01-06 MED ORDER — FLUOXETINE HCL 20 MG PO CAPS
40.0000 mg | ORAL_CAPSULE | Freq: Every day | ORAL | Status: DC
  Administered 2017-01-06 – 2017-01-08 (×3): 40 mg via ORAL
  Filled 2017-01-06 (×3): qty 2

## 2017-01-06 MED ORDER — PROMETHAZINE HCL 25 MG PO TABS
25.0000 mg | ORAL_TABLET | Freq: Every day | ORAL | Status: DC
  Administered 2017-01-06 – 2017-01-08 (×3): 25 mg via ORAL
  Filled 2017-01-06 (×3): qty 1

## 2017-01-06 MED ORDER — IPRATROPIUM-ALBUTEROL 0.5-2.5 (3) MG/3ML IN SOLN
3.0000 mL | RESPIRATORY_TRACT | Status: DC
  Administered 2017-01-06 – 2017-01-08 (×11): 3 mL via RESPIRATORY_TRACT
  Filled 2017-01-06 (×12): qty 3

## 2017-01-06 MED ORDER — INSULIN ASPART 100 UNIT/ML ~~LOC~~ SOLN
0.0000 [IU] | Freq: Three times a day (TID) | SUBCUTANEOUS | Status: DC
  Administered 2017-01-06: 20 [IU] via SUBCUTANEOUS
  Administered 2017-01-07 (×2): 4 [IU] via SUBCUTANEOUS
  Administered 2017-01-07: 15 [IU] via SUBCUTANEOUS
  Administered 2017-01-08: 4 [IU] via SUBCUTANEOUS
  Filled 2017-01-06 (×5): qty 1

## 2017-01-06 MED ORDER — SUCRALFATE 1 G PO TABS
1.0000 g | ORAL_TABLET | Freq: Four times a day (QID) | ORAL | Status: DC
  Administered 2017-01-06 – 2017-01-08 (×7): 1 g via ORAL
  Filled 2017-01-06 (×7): qty 1

## 2017-01-06 MED ORDER — INSULIN DETEMIR 100 UNIT/ML FLEXPEN: 25.0000 [IU] | PEN_INJECTOR | Freq: Every day | SUBCUTANEOUS | Status: DC

## 2017-01-06 NOTE — H&P (Addendum)
[facility] [place] NAME: Mckenzie Taylor    MR#:  [id number]  DATE OF BIRTH:  12-10-1940  DATE OF ADMISSION:  01/06/2017  PRIMARY CARE PHYSICIAN: Denton Lank, MD   REQUESTING/REFERRING PHYSICIAN: dr Dineen Kid  CHIEF COMPLAINT:    Short of breath and weight gain HISTORY OF PRESENT ILLNESS:  Mckenzie Taylor  is a 76 y.o. female with a known history of chronic hypoxic/hypercapnic respiratory failure on 3 L of oxygen at home due to COPD, chronic systolic heart failure ejection fraction 45-50% by echocardiogram June 2018 and type 2 diabetes who presents with above complaint. Patient reports over the past 24 hours she has had increasing shortness of breath, weight gain, cough, wheezing, dyspnea exertion and PND. She will cup in the middle of the night because she could not breathe. She sat up in her recliner all night until this morning when she called her daughter who then called EMS who brought her to the ER for further evaluation. She has received 40 mg IV Lasix, 125 mg IV Solu-Medrol, DuoNeb's. She continues have shortness of breath and wheezing. She reports that her baseline weight is 198 pounds She weighed herself 4 days ago and she weighed 222. She has not follow-up with cardiology. She reports every time she has an appointment she is hospitalized. She was last hospitalized in September for COPD exacerbation. She denies sick contacts or fevers or recent travel. Chest x-ray shows bilateral pleural effusions consistent with CHF.  PAST MEDICAL HISTORY:   Past Medical History:  Diagnosis Date  . Autosomal recessive polycystic kidneys   . Avascular necrosis of hip (HCC)    bilateral  . CAD (coronary artery disease)    mild  . CHF (congestive heart failure) ([place])   . Chronic kidney disease    cyst  . COPD (chronic obstructive pulmonary disease) ([place])   . Diabetes type 2, controlled ([place])   . GERD (gastroesophageal reflux disease)   .  HTN (hypertension)   . Hypothyroidism    subclinical. low TSH, normal thyroid panel. biopsy 2010  . Vitamin B12 deficiency     PAST SURGICAL HISTORY:   Past Surgical History:  Procedure Laterality Date  . CHOLECYSTECTOMY    . hip replacement     bilateral-secondary to avascular necrosis  . right eye lens replacement    . ULNAR NERVE REPAIR     bilateral  . VESICOVAGINAL FISTULA CLOSURE W/ TAH      SOCIAL HISTORY:   Social History  Substance Use Topics  . Smoking status: Former Smoker    Packs/day: 1.00    Years: 50.00  . Smokeless tobacco: Never Used     Comment: 1 ppd - 50 years   . Alcohol use No    FAMILY HISTORY:   Family History  Problem Relation Age of Onset  . Cancer Father        lung  . COPD Mother   . Heart disease Mother   . Heart disease Maternal Uncle     DRUG ALLERGIES:   Allergies  Allergen Reactions  . Other Rash    Pt reports allergy to metals.    REVIEW OF SYSTEMS:   Review of Systems  Constitutional: Negative for chills, fever and malaise/fatigue.  HENT: Negative.  Negative for ear discharge, ear pain, hearing loss, nosebleeds and sore throat.   Eyes: Negative.  Negative for blurred vision and pain.  Respiratory: Positive for cough, sputum production, shortness of breath and wheezing.  Negative for hemoptysis.   Cardiovascular: Positive for leg swelling and PND. Negative for chest pain and palpitations.  Gastrointestinal: Negative.  Negative for abdominal pain, blood in stool, diarrhea, nausea and vomiting.  Genitourinary: Negative.  Negative for dysuria.  Musculoskeletal: Negative.  Negative for back pain.  Skin: Negative.   Neurological: Positive for weakness. Negative for dizziness, tremors, speech change, focal weakness, seizures and headaches.  Endo/Heme/Allergies: Negative.  Does not bruise/bleed easily.  Psychiatric/Behavioral: Negative.  Negative for depression, hallucinations and suicidal ideas.    MEDICATIONS AT HOME:    Prior to Admission medications   Medication Sig Start Date End Date Taking? Authorizing Provider  albuterol (PROVENTIL) (2.5 MG/3ML) 0.083% nebulizer solution Take by nebulization every 4 (four) hours as needed for wheezing or shortness of breath.   Yes [provider]  amLODipine (NORVASC) 10 MG tablet Take 10 mg by mouth daily.     Yes [provider]  atorvastatin (LIPITOR) 40 MG tablet Take 40 mg by mouth daily.   Yes [provider]  Cholecalciferol (VITAMIN D3) 5000 units CAPS Take 1 capsule by mouth daily.   Yes [provider]  Cyanocobalamin 1500 MCG TBDP Take 3 tablets by mouth daily.   Yes [provider]  enalapril (VASOTEC) 5 MG tablet Take 5 mg by mouth daily.   Yes [provider]  FLUoxetine (PROZAC) 40 MG capsule Take 40 mg by mouth.   Yes [provider]  Fluticasone-Salmeterol (ADVAIR) 250-50 MCG/DOSE AEPB Inhale 1 puff into the lungs 2 (two) times daily.   Yes [provider]  gabapentin (NEURONTIN) 100 MG capsule Take 200 mg by mouth 3 (three) times daily.   Yes [provider]  LEVEMIR FLEXTOUCH 100 UNIT/ML Pen Inject 25 Units into the skin at bedtime.  07/23/16  Yes [provider]  metoprolol tartrate (LOPRESSOR) 25 MG tablet Take 1 tablet (25 mg total) by mouth 2 (two) times daily. Patient taking differently: Take 25 mg by mouth daily.  08/19/16  Yes Vaughan Basta, MD  NOVOLOG FLEXPEN 100 UNIT/ML FlexPen Inject 12-18 Units into the skin 3 (three) times daily as needed. Sliding scale if blood sugar is above 200. 04/02/16  Yes [provider]  omeprazole (PRILOSEC) 20 MG capsule Take 20 mg by mouth daily.   Yes [provider]  promethazine (PHENERGAN) 25 MG tablet Take 1 tablet by mouth daily. 08/16/14  Yes [provider]  sucralfate (CARAFATE) 1 g tablet Take 1 tablet (1 g total) by mouth 4 (four) times daily. 10/29/16 10/29/17 Yes Theodoro Grist, MD   tiotropium (SPIRIVA) 18 MCG inhalation capsule Place 18 mcg into inhaler and inhale daily.   Yes [provider]  tiZANidine (ZANAFLEX) 4 MG capsule Take 4 mg by mouth 3 (three) times daily as needed for muscle spasms.   Yes [provider]  torsemide (DEMADEX) 10 MG tablet Take 10 mg by mouth daily.   Yes [provider]  traZODone (DESYREL) 50 MG tablet Take 50 mg by mouth at bedtime. 08/10/16  Yes [provider]  albuterol (VENTOLIN HFA) 108 (90 BASE) MCG/ACT inhaler Inhale 2 puffs into the lungs every 4 (four) hours as needed.     [provider]  ipratropium-albuterol (DUONEB) 0.5-2.5 (3) MG/3ML SOLN Take 3 mLs by nebulization 4 (four) times daily. Patient not taking: Reported on 01/06/2017 03/28/16   Vaughan Basta, MD  levofloxacin (LEVAQUIN) 250 MG tablet Take 1 tablet (250 mg total) by mouth daily. Patient not taking: Reported  on 01/06/2017 11/27/16   Hillary Bow, MD  predniSONE (STERAPRED UNI-PAK 21 TAB) 10 MG (21) TBPK tablet Please take 6 pills in the morning on the day 1, then taper by one pill daily until finished Patient not taking: Reported on 01/06/2017 11/27/16   Hillary Bow, MD      VITAL SIGNS:  Pulse (!) 111, temperature 98.6 F (37 C), temperature source Oral, resp. rate 16, height 5\' 5"  (1.651 m), weight 102.1 kg (225 lb), SpO2 94 %.  PHYSICAL EXAMINATION:   Physical Exam  Constitutional: She is oriented to person, place, and time and well-developed, well-nourished, and in no distress. No distress.  HENT:  Head: Normocephalic.  Eyes: No scleral icterus.  Neck: Normal range of motion. Neck supple. JVD (to the jaw) present. No tracheal deviation present.  Cardiovascular: Normal rate, regular rhythm and normal heart sounds.  Exam reveals no gallop and no friction rub.   No murmur heard. Pulmonary/Chest: Effort normal. No respiratory distress. She has wheezes. She has rales. She exhibits no tenderness.  Sitting  upright with increased work of breathing  Abdominal: Soft. Bowel sounds are normal. She exhibits distension. She exhibits no mass. There is no tenderness. There is no rebound and no guarding.  Ventral hernia  Musculoskeletal: Normal range of motion. She exhibits edema.  Neurological: She is alert and oriented to person, place, and time.  Skin: Skin is warm. No rash noted. No erythema.  Psychiatric: Affect and judgment normal.      LABORATORY PANEL:   CBC  Recent Labs Lab 01/06/17 0922  WBC 10.4  HGB 8.8*  HCT 28.9*  PLT 328   ------------------------------------------------------------------------------------------------------------------  Chemistries   Recent Labs Lab 01/06/17 0922  NA 141  K 3.5  CL 97*  CO2 33*  GLUCOSE 218*  BUN 12  CREATININE 1.36*  CALCIUM 8.6*   ------------------------------------------------------------------------------------------------------------------  Cardiac Enzymes  Recent Labs Lab 01/06/17 0922  TROPONINI <0.03   ------------------------------------------------------------------------------------------------------------------  RADIOLOGY:  Dg Chest 2 View  Result Date: 01/06/2017 CLINICAL DATA:  Chest pain, shortness of breath, chest tightness, former smoking history EXAM: CHEST  2 VIEW COMPARISON:  Chest x-ray of 11/25/2016 FINDINGS: The lungs are not well aerated. There is cardiomegaly present with mild pulmonary vascular congestion and small pleural effusions, consistent with congestive heart failure. No acute bony abnormality is noted. IMPRESSION: CHF with small bilateral pleural effusions. Electronically Signed   By: Ivar Drape M.D.   On: 01/06/2017 10:01    EKG:  Sinus tachycardia prolonged QTC without ST elevation or depression  IMPRESSION AND PLAN:   76 year old female with a history of chronic systolic heart failure ejection fraction 45-50%, chronic hypoxic/hypercapnic respiratory failure on 3 L of oxygen due to  COPD and diabetes who presents with weight gain and shortness of breath.  1. Acute on chronic systolic CHF exacerbation, EF 45-50% by echo June 2018 with a substantial amount of weight gain   start IV Lasix 40 mg twice a day   monitor intake and output and daily weight   continue enalapril and metoprolol. CHF referral upon discharge Consider cardiology consultation  2. Acute COPD exacerbation with acute bronchitis: Start Solu-Medrol 40 mg IV every 12 Continue DuoNeb's Start doxycycline for acute bronchitis  3. Diabetes: Continue Levemir with sliding scale and ADA diet Diabetes nurse consultation  4. GERD: Continue PPI and Carafate  5. Mild CAD: Continue atorvastatin and metoprolol  6. Essential hypertension: Continue Norvasc, enalapril and metoprolol  7. Anemia of chronic disease: Monitor  CBC Check anemia panel     All the records are reviewed and case discussed with ED provider. Management plans discussed with the patient and she is in agreement  CODE DO NOT RESUSCITATE   TOTAL TIME TAKING CARE OF THIS PATIENT:43 minutes.    Ryleigh Buenger M.D on 01/06/2017 at 10:31 AM  Between 7am to 6pm - Pager - [phone]  After 6pm go to [email or web address] - password [email or web address] [facility]  Office  [phone]  CC: Primary care physician; Denton Lank, MD

## 2017-01-06 NOTE — ED Triage Notes (Addendum)
Pt to ED via EMS from home with c/o SOB worsening since last night, pt wears 3L continuous. Pt also c/o some chest tightness. Pt HR 115. Pt was given 10 albuertol, duoneb, and 125 solumedrol in route.

## 2017-01-06 NOTE — Progress Notes (Signed)
Talked to Dr. Benjie Karvonen about patient's blood sugar of 436 will give 20 units of Novolog and will recheck blood sugar, patient receiving IV solumedrol and just ate dinner. Also talked about patient's code status and per patient she don't want to be intubated but want to have compression if needed, per MD code status need to be change to limited. No other concern at the moment. RN will continue to monitor.

## 2017-01-06 NOTE — ED Provider Notes (Signed)
[facility] Emergency Department Provider Note  ____________________________________________   First MD Initiated Contact with Patient 01/06/17 0940     (approximate)  I have reviewed the triage vital signs and the nursing notes.   HISTORY  Chief Complaint Shortness of Breath   HPI Mckenzie Taylor is a 76 y.o. female with a history of COPD and CHF was presenting with 1 day of worsening shortness of breath. Not reporting any fever or chest pain. Says that she has had worsening swelling to her bilateral lower extremity is. Admitted approximate one month ago for a similar issue to the hospital. Patient is on 3 L baseline nasal cannula oxygen. Given 10 mg of albuterol as well as a DuoNeb and 125 mg of Solu-Medrol en route.   Past Medical History:  Diagnosis Date  . Autosomal recessive polycystic kidneys   . Avascular necrosis of hip (HCC)    bilateral  . CAD (coronary artery disease)    mild  . CHF (congestive heart failure) ([place])   . Chronic kidney disease    cyst  . COPD (chronic obstructive pulmonary disease) ([place])   . Diabetes type 2, controlled ([place])   . GERD (gastroesophageal reflux disease)   . HTN (hypertension)   . Hypothyroidism    subclinical. low TSH, normal thyroid panel. biopsy 2010  . Vitamin B12 deficiency     Patient Active Problem List   Diagnosis Date Noted  . COPD (chronic obstructive pulmonary disease) ([place]) 11/25/2016  . Nausea and vomiting 10/29/2016  . Acute on chronic congestive heart failure ([place])   . Acute pulmonary edema (HCC)   . Acute respiratory failure with hypoxia ([place]) 10/25/2016  . COPD with acute exacerbation ([place]) 08/14/2016  . CAP (community acquired pneumonia) 08/14/2016  . Acute systolic CHF (congestive heart failure) ([place]) 08/14/2016  . Accelerated hypertension 08/14/2016  . GERD (gastroesophageal reflux disease) 08/14/2016  . Diabetes ([place]) 08/14/2016  . Respiratory distress 05/05/2016  . Acute  respiratory failure ([place]) 03/24/2016  . 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 TOBACCO ABUSE 12/17/2008  . Coronary atherosclerosis of native coronary artery 12/17/2008  . 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 CLAUDICATION, INTERMITTENT 12/17/2008    Past Surgical History:  Procedure Laterality Date  . CHOLECYSTECTOMY    . hip replacement     bilateral-secondary to avascular necrosis  . right eye lens replacement    . ULNAR NERVE REPAIR     bilateral  . VESICOVAGINAL FISTULA CLOSURE W/ TAH      Prior to Admission medications   Medication Sig Start Date End Date Taking? Authorizing Provider  albuterol (PROVENTIL) (2.5 MG/3ML) 0.083% nebulizer solution Take by nebulization every 4 (four) hours as needed for wheezing or shortness of breath.    [provider]  albuterol (VENTOLIN HFA) 108 (90 BASE) MCG/ACT inhaler Inhale 2 puffs into the lungs every 4 (four) hours as needed.     [provider]  amLODipine (NORVASC) 10 MG tablet Take 10 mg by mouth daily.      [provider]  aspirin 81 MG tablet Take by mouth daily.    [provider]  atorvastatin (LIPITOR) 40 MG tablet Take 40 mg by mouth daily.    [provider]  Cholecalciferol (VITAMIN D3) 5000 units CAPS Take 1 capsule by mouth daily.    [provider]  Cyanocobalamin 1500 MCG TBDP Take 3 tablets by mouth daily.    [provider]  enalapril (VASOTEC) 5 MG  tablet Take 5 mg by mouth daily.    [provider]  FLUoxetine (PROZAC) 40 MG capsule Take 40 mg by mouth.    [provider]  Fluticasone-Salmeterol (ADVAIR) 250-50 MCG/DOSE AEPB Inhale 1 puff into the lungs 2 (two) times daily.    [provider]  furosemide (LASIX) 40 MG tablet Take 40 mg by mouth daily. 04/06/16   [provider]  gabapentin (NEURONTIN) 100 MG capsule Take 200 mg by mouth 3 (three) times daily.    [provider]    ipratropium-albuterol (DUONEB) 0.5-2.5 (3) MG/3ML SOLN Take 3 mLs by nebulization 4 (four) times daily. 03/28/16   Vaughan Basta, MD  LEVEMIR FLEXTOUCH 100 UNIT/ML Pen Inject 25 Units into the skin at bedtime.  07/23/16   [provider]  levofloxacin (LEVAQUIN) 250 MG tablet Take 1 tablet (250 mg total) by mouth daily. 11/27/16   Hillary Bow, MD  metoprolol tartrate (LOPRESSOR) 25 MG tablet Take 1 tablet (25 mg total) by mouth 2 (two) times daily. Patient taking differently: Take 25 mg by mouth daily.  08/19/16   Vaughan Basta, MD  NOVOLOG FLEXPEN 100 UNIT/ML FlexPen Inject 2-10 Units into the skin 3 (three) times daily as needed. Sliding scale if blood sugar is above 200. 04/02/16   [provider]  omeprazole (PRILOSEC) 20 MG capsule Take 20 mg by mouth daily.    [provider]  predniSONE (STERAPRED UNI-PAK 21 TAB) 10 MG (21) TBPK tablet Please take 6 pills in the morning on the day 1, then taper by one pill daily until finished 11/27/16   Sudini, Srikar, MD  promethazine (PHENERGAN) 25 MG tablet Take 1 tablet by mouth daily. 08/16/14   [provider]  sucralfate (CARAFATE) 1 g tablet Take 1 tablet (1 g total) by mouth 4 (four) times daily. 10/29/16 10/29/17  Theodoro Grist, MD  tiotropium (SPIRIVA) 18 MCG inhalation capsule Place 18 mcg into inhaler and inhale daily.    [provider]  tiZANidine (ZANAFLEX) 4 MG capsule Take 4 mg by mouth 3 (three) times daily as needed for muscle spasms.    [provider]  traZODone (DESYREL) 50 MG tablet Take 50 mg by mouth at bedtime. 08/10/16   [provider]  umeclidinium bromide (INCRUSE ELLIPTA) 62.5 MCG/INH AEPB Inhale 1 puff into the lungs daily.    [provider]    Allergies Other  Family History  Problem Relation Age of Onset  . Cancer Father        lung  . COPD Mother   . Heart disease Mother   . Heart disease Maternal Uncle     Social  History Social History  Substance Use Topics  . Smoking status: Former Smoker    Packs/day: 1.00    Years: 50.00  . Smokeless tobacco: Never Used     Comment: 1 ppd - 50 years   . Alcohol use No    Review of Systems  Constitutional: No fever/chills Eyes: No visual changes. ENT: No sore throat. Cardiovascular: Denies chest pain. Respiratory: As above Gastrointestinal: No abdominal pain.  No nausea, no vomiting.  No diarrhea.  No constipation. Genitourinary: Negative for dysuria. Musculoskeletal: Negative for back pain. Skin: Negative for rash. Neurological: Negative for headaches, focal weakness or numbness.   ____________________________________________   PHYSICAL EXAM:  VITAL SIGNS: ED Triage Vitals  Enc Vitals Group     BP --      Pulse Rate 01/06/17 0909 (!) 118  Resp 01/06/17 0909 (!) 24     Temp 01/06/17 0913 98.6 F (37 C)     Temp Source 01/06/17 0913 Oral     SpO2 01/06/17 0909 92 %     Weight 01/06/17 0910 225 lb (102.1 kg)     Height 01/06/17 0910 5\' 5"  (1.651 m)     Head Circumference --      Peak Flow --      Pain Score 01/06/17 0908 6     Pain Loc --      Pain Edu? --      Excl. in [place]? --     Constitutional: Alert and oriented. Well appearing and in no acute distress. Eyes: Conjunctivae are normal.  Head: Atraumatic. Nose: No congestion/rhinnorhea. Mouth/Throat: Mucous membranes are moist.  Neck: No stridor.   Cardiovascular: Normal rate, regular rhythm. Grossly normal heart sounds.  Good peripheral circulation. Respiratory: Normal respiratory effort.  No retractions. Prolonged expiratory phase with coarse wheezing throughout Gastrointestinal: Soft and nontender. No distention. Easily reducible ventral hernia which is mildly tender. Patient says this is baseline tenderness for her. Musculoskeletal: Bilateral moderate lower extremity edema to the calves as well as the ankles. No joint effusions. Neurologic:  Normal speech and language. No  gross focal neurologic deficits are appreciated. Skin:  Skin is warm, dry and intact. No rash noted. Psychiatric: Mood and affect are normal. Speech and behavior are normal.  ____________________________________________   LABS (all labs ordered are listed, but only abnormal results are displayed)  Labs Reviewed  CBC - Abnormal; Notable for the following:       Result Value   RBC 3.32 (*)    Hemoglobin 8.8 (*)    HCT 28.9 (*)    MCHC 30.5 (*)    RDW 20.1 (*)    All other components within normal limits  TROPONIN I  BASIC METABOLIC PANEL   ____________________________________________  EKG  ED ECG REPORT I, Doran Stabler, the attending physician, personally viewed and interpreted this ECG.   Date: 01/06/2017  EKG Time: 0924  Rate: 110  Rhythm: sinus tachycardia  Axis: Normal  Intervals:Prolonged QTC.  ST&T Change: No ST segment elevation or depression. No abnormal T-wave inversion.  ____________________________________________  [id number]  CHF with effusions ____________________________________________   PROCEDURES  Procedure(s) performed:   Procedures  Critical Care performed:   ____________________________________________   INITIAL IMPRESSION / ASSESSMENT AND PLAN / ED COURSE  Pertinent labs & imaging results that were available during my care of the patient were reviewed by me and considered in my medical decision making (see chart for details).  Differential includes, but is not limited to, viral syndrome, bronchitis including COPD exacerbation, pneumonia, reactive airway disease including asthma, CHF including exacerbation with or without pulmonary/interstitial edema, pneumothorax, ACS, thoracic trauma, and pulmonary embolism.  As part of my medical decision making, I reviewed the following data within the electronic MEDICAL RECORD NUMBER Notes from prior ED visits  ----------------------------------------- 10:10 AM on  01/06/2017 -----------------------------------------  Patient still with wheezing after multiple neurologic treatments and steroids. She will require diuresis as well. Patient will be admitted to the hospital. Signed out to Dr. Genia Harold.         ____________________________________________   FINAL CLINICAL IMPRESSION(S) / ED DIAGNOSES  COPD.  CHF.      NEW MEDICATIONS STARTED DURING THIS VISIT:  New Prescriptions   No medications on file     Note:  This document was prepared using Dragon voice recognition software and may  include unintentional dictation errors.     Orbie Pyo, MD 01/06/17 1010

## 2017-01-06 NOTE — ED Notes (Signed)
Pt in NAD at time of transfer to floor, VS stable, 95% on 3L, this RN took pt up with nursing tech student

## 2017-01-06 NOTE — Progress Notes (Signed)
Talked to Dr. Jannifer Franklin about patient's blood sugar of 455, 20 units of Novolog has been given, order to give 5 units of insulin regular via IV and recheck blood sugar in an hour. No other concern at the moment. RN will continue to monitor.

## 2017-01-06 NOTE — Progress Notes (Signed)
Inpatient Diabetes Program Recommendations  AACE/ADA: New Consensus Statement on Inpatient Glycemic Control (2015)  Target Ranges:  Prepandial:   less than 140 mg/dL      Peak postprandial:   less than 180 mg/dL (1-2 hours)      Critically ill patients:  140 - 180 mg/dL   Lab Results  Component Value Date   GLUCAP 318 (H) 01/06/2017   HGBA1C 6.7 (H) 03/26/2016    Review of Glycemic Control  Results for Mckenzie Taylor, Mckenzie Taylor (MRN [id number]) as of 01/06/2017 13:22  Ref. Range 01/06/2017 12:02  Glucose-Capillary Latest Ref Range: 65 - 99 mg/dL 318 (H)    Diabetes history: Type 2 Outpatient Diabetes medications: Levemir 25 units qhs, Novolog 8-18 units tid,  Current orders for Inpatient glycemic control: Levemir 25 units qhs, Novolog 0-20 units tid * 60 mg Solu-medrol q day  Inpatient Diabetes Program Recommendations:  Consider decreasing Levemir to 20 units qhs, add Novolog 3 units tid (hold if she eats less than 50%) and change Novolog correction to moderate correction scale (reduced renal function).    Text paged Dr. Heriberto Antigua, RN, BA, [place], CDE Diabetes Coordinator Inpatient Diabetes Program  [phone] (Team Pager) [phone] ([place]) 01/06/2017 1:27 PM

## 2017-01-07 LAB — BASIC METABOLIC PANEL
Anion gap: 9 (ref 5–15)
BUN: 19 mg/dL (ref 6–20)
CO2: 34 mmol/L — ABNORMAL HIGH (ref 22–32)
Calcium: 8.5 mg/dL — ABNORMAL LOW (ref 8.9–10.3)
Chloride: 97 mmol/L — ABNORMAL LOW (ref 101–111)
Creatinine, Ser: 1.34 mg/dL — ABNORMAL HIGH (ref 0.44–1.00)
GFR calc Af Amer: 44 mL/min — ABNORMAL LOW (ref >60)
GFR calc non Af Amer: 38 mL/min — ABNORMAL LOW (ref >60)
Glucose, Bld: 214 mg/dL — ABNORMAL HIGH (ref 65–99)
Potassium: 3.8 mmol/L (ref 3.5–5.1)
Sodium: 140 mmol/L (ref 135–145)

## 2017-01-07 LAB — CBC
HCT: 26.9 % — ABNORMAL LOW (ref 35.0–47.0)
Hemoglobin: 8.3 g/dL — ABNORMAL LOW (ref 12.0–16.0)
MCH: 26.6 pg (ref 26.0–34.0)
MCHC: 31 g/dL — ABNORMAL LOW (ref 32.0–36.0)
MCV: 85.6 fL (ref 80.0–100.0)
Platelets: 313 10*3/uL (ref 150–440)
RBC: 3.14 MIL/uL — ABNORMAL LOW (ref 3.80–5.20)
RDW: 20.3 % — ABNORMAL HIGH (ref 11.5–14.5)
WBC: 8.8 10*3/uL (ref 3.6–11.0)

## 2017-01-07 LAB — GLUCOSE, CAPILLARY
Glucose-Capillary: 195 mg/dL — ABNORMAL HIGH (ref 65–99)
Glucose-Capillary: 162 mg/dL — ABNORMAL HIGH (ref 65–99)
Glucose-Capillary: 323 mg/dL — ABNORMAL HIGH (ref 65–99)
Glucose-Capillary: 247 mg/dL — ABNORMAL HIGH (ref 65–99)

## 2017-01-07 NOTE — Consult Note (Signed)
Consultation Note Date: 01/07/2017   Patient Name: Mckenzie Taylor  DOB: 1941-02-07  MRN: [id number]  Age / Sex: 76 y.o., female  PCP: Denton Lank, MD Referring Physician: Fritzi Mandes, MD  Reason for Consultation: Establishing goals of care  HPI/Patient Profile: Mckenzie Taylor  is a 76 y.o. female with a known history of chronic hypoxic/hypercapnic respiratory failure on 3 L of oxygen at home due to COPD, chronic systolic heart failure ejection fraction 45-50% by echocardiogram June 2018 and type 2 diabetes.   Clinical Assessment and Goals of Care: Mckenzie Taylor states she has several children and grandchildren, and great grandchildren. She has 2 sons that live within a few minutes of her. She and her husband are separated, but not divorced. She states she has POA papers that list him and one of her sons as her decision makers, because he will do what she wants. She states her family is very important to her and she wants to be sure they will be ok, as she has functioned as mother and grandmother. She states " If I'm going, I'm going out fighting." She states that she does not want to be intubated or placed on a ventilator because "with my COPD, I wouldn't be able to come off the breathing machine and my family would be forced to make a decision to take me off, and I don't want them to have to do that". She states she believes God has planned a time to go and your days are numbered. Discussed code status and she states she has a DNR at home. She confirms DNR wishes though states some of her family does not agree with her wishes. She also does not want a feeding tube. She states she really wants to see cardiologist but she keeps having to see the cardiology NP to discuss her diet and she is frustrated she has taken these courses and wants to see a doctor.  Will have palliative care follow.   PATIENT is Warehouse manager    SUMMARY OF RECOMMENDATIONS   Home with palliative  Code Status/Advance Care Planning:  DNR    Prognosis:   < 12 months  Discharge Planning: Home with Palliative Services      Primary Diagnoses: Present on Admission: **None**   I have reviewed the medical record, interviewed the patient and family, and examined the patient. The following aspects are pertinent.  Past Medical History:  Diagnosis Date  . Autosomal recessive polycystic kidneys   . Avascular necrosis of hip (HCC)    bilateral  . CAD (coronary artery disease)    mild  . CHF (congestive heart failure) ([place])   . Chronic kidney disease    cyst  . COPD (chronic obstructive pulmonary disease) ([place])   . Diabetes type 2, controlled ([place])   . GERD (gastroesophageal reflux disease)   . HTN (hypertension)   . Hypothyroidism    subclinical. low TSH, normal thyroid panel. biopsy 2010  . Vitamin B12 deficiency    Social History   Social  History  . Marital status: Married    Spouse name: N/A  . Number of children: N/A  . Years of education: N/A   Social History Main Topics  . Smoking status: Former Smoker    Packs/day: 1.00    Years: 50.00  . Smokeless tobacco: Never Used     Comment: 1 ppd - 50 years   . Alcohol use No  . Drug use: No  . Sexual activity: Not Asked   Other Topics Concern  . None   Social History Narrative   Separated, has 2 adult children.    Retired Regulatory affairs officer, on disability.    Family History  Problem Relation Age of Onset  . Cancer Father        lung  . COPD Mother   . Heart disease Mother   . Heart disease Maternal Uncle    Scheduled Meds: . amLODipine  10 mg Oral Daily  . atorvastatin  40 mg Oral Daily  . cholecalciferol  5,000 Units Oral Daily  . doxycycline  100 mg Oral Q12H  . enalapril  5 mg Oral Daily  . enoxaparin (LOVENOX) injection  40 mg Subcutaneous Q24H  . FLUoxetine  40 mg Oral Daily  . furosemide  40 mg Intravenous BID  . gabapentin  200 mg  Oral TID  . insulin aspart  0-20 Units Subcutaneous TID WC  . insulin detemir  25 Units Subcutaneous QHS  . ipratropium-albuterol  3 mL Nebulization Q4H  . methylPREDNISolone (SOLU-MEDROL) injection  60 mg Intravenous Q24H  . metoprolol tartrate  25 mg Oral Daily  . mometasone-formoterol  2 puff Inhalation BID  . pantoprazole  40 mg Oral Daily  . promethazine  25 mg Oral Daily  . sodium chloride flush  3 mL Intravenous Q12H  . sucralfate  1 g Oral QID  . tiotropium  18 mcg Inhalation Daily  . traZODone  50 mg Oral QHS  . vitamin B-12  1,500 mcg Oral Daily   Continuous Infusions: . sodium chloride     PRN Meds:.sodium chloride, acetaminophen **OR** acetaminophen, bisacodyl, HYDROcodone-acetaminophen, ondansetron **OR** ondansetron (ZOFRAN) IV, senna-docusate, sodium chloride flush Medications Prior to Admission:  Prior to Admission medications   Medication Sig Start Date End Date Taking? Authorizing Provider  albuterol (PROVENTIL) (2.5 MG/3ML) 0.083% nebulizer solution Take by nebulization every 4 (four) hours as needed for wheezing or shortness of breath.   Yes [provider]  amLODipine (NORVASC) 10 MG tablet Take 10 mg by mouth daily.     Yes [provider]  atorvastatin (LIPITOR) 40 MG tablet Take 40 mg by mouth daily.   Yes [provider]  Cholecalciferol (VITAMIN D3) 5000 units CAPS Take 1 capsule by mouth daily.   Yes [provider]  Cyanocobalamin 1500 MCG TBDP Take 3 tablets by mouth daily.   Yes [provider]  enalapril (VASOTEC) 5 MG tablet Take 5 mg by mouth daily.   Yes [provider]  FLUoxetine (PROZAC) 40 MG capsule Take 40 mg by mouth.   Yes [provider]  Fluticasone-Salmeterol (ADVAIR) 250-50 MCG/DOSE AEPB Inhale 1 puff into the lungs 2 (two) times daily.   Yes [provider]  gabapentin (NEURONTIN) 100 MG capsule Take 200 mg by mouth 3 (three) times daily.   Yes [provider]   LEVEMIR FLEXTOUCH 100 UNIT/ML Pen Inject 25 Units into the skin at bedtime.  07/23/16  Yes [provider]  metoprolol tartrate (LOPRESSOR) 25 MG tablet Take 1 tablet (25  mg total) by mouth 2 (two) times daily. Patient taking differently: Take 25 mg by mouth daily.  08/19/16  Yes Vaughan Basta, MD  NOVOLOG FLEXPEN 100 UNIT/ML FlexPen Inject 12-18 Units into the skin 3 (three) times daily as needed. Sliding scale if blood sugar is above 200. 04/02/16  Yes [provider]  omeprazole (PRILOSEC) 20 MG capsule Take 20 mg by mouth daily.   Yes [provider]  promethazine (PHENERGAN) 25 MG tablet Take 1 tablet by mouth daily. 08/16/14  Yes [provider]  sucralfate (CARAFATE) 1 g tablet Take 1 tablet (1 g total) by mouth 4 (four) times daily. 10/29/16 10/29/17 Yes Theodoro Grist, MD  tiotropium (SPIRIVA) 18 MCG inhalation capsule Place 18 mcg into inhaler and inhale daily.   Yes [provider]  tiZANidine (ZANAFLEX) 4 MG capsule Take 4 mg by mouth 3 (three) times daily as needed for muscle spasms.   Yes [provider]  torsemide (DEMADEX) 10 MG tablet Take 10 mg by mouth daily.   Yes [provider]  traZODone (DESYREL) 50 MG tablet Take 50 mg by mouth at bedtime. 08/10/16  Yes [provider]  albuterol (VENTOLIN HFA) 108 (90 BASE) MCG/ACT inhaler Inhale 2 puffs into the lungs every 4 (four) hours as needed.     [provider]  ipratropium-albuterol (DUONEB) 0.5-2.5 (3) MG/3ML SOLN Take 3 mLs by nebulization 4 (four) times daily. Patient not taking: Reported on 01/06/2017 03/28/16   Vaughan Basta, MD  levofloxacin (LEVAQUIN) 250 MG tablet Take 1 tablet (250 mg total) by mouth daily. Patient not taking: Reported on 01/06/2017 11/27/16   Hillary Bow, MD  predniSONE (STERAPRED UNI-PAK 21 TAB) 10 MG (21) TBPK tablet Please take 6 pills in the morning on the day 1, then taper by one pill daily until  finished Patient not taking: Reported on 01/06/2017 11/27/16   Hillary Bow, MD   Allergies  Allergen Reactions  . Other Rash    Pt reports allergy to metals.   Review of Systems  Constitutional: Positive for activity change.  Respiratory: Positive for shortness of breath.     Physical Exam  Constitutional: She is oriented to person, place, and time. No distress.  Pulmonary/Chest:  Nasal cannula  Neurological: She is alert and oriented to person, place, and time.    Vital Signs: BP (!) 143/69 (BP Location: Right Arm)   Pulse (!) 110   Temp 98 F (36.7 C) (Oral)   Resp 18   Ht 5\' 5"  (1.651 m)   Wt 102 kg (224 lb 12.8 oz)   SpO2 95%   BMI 37.41 kg/m  Pain Assessment: No/denies pain   Pain Score: Asleep   SpO2: SpO2: 95 % O2 Device:SpO2: 95 % O2 Flow Rate: .O2 Flow Rate (L/min): 3 L/min  IO: Intake/output summary:  Intake/Output Summary (Last 24 hours) at 01/07/17 1605 Last data filed at 01/07/17 1417  Gross per 24 hour  Intake              720 ml  Output             2800 ml  Net            -2080 ml    LBM: Last BM Date: 01/06/17 Baseline Weight: Weight: 102.1 kg (225 lb) Most recent weight: Weight: 102 kg (224 lb 12.8 oz)     Palliative Assessment/Data: 60%     Time In: 3:36 Time Out: 4:36 Time Total: 1 hour Greater than  50%  of this time was spent counseling and coordinating care related to the above assessment and plan.  Signed by: Asencion Gowda, NP 01/07/2017 4:37 PM Office: [phone] 7am-7pm  Pager: [phone] Call primary team after hours  Please contact Palliative Medicine Team phone at [phone] for questions and concerns.  For individual provider: See Shea Evans

## 2017-01-07 NOTE — Care Management (Signed)
patient admitted from home with CHF.  she says she has access to scales and no issues with transportation but she has not kept her appointments with [facility]. She declines need for home health but then says she will consider

## 2017-01-07 NOTE — Plan of Care (Signed)
Problem: Fluid Volume: Goal: Ability to maintain a balanced intake and output will improve Outcome: Progressing Patient receiving IV lasix, diuresing well.

## 2017-01-07 NOTE — Progress Notes (Signed)
[facility] at [place] NAME: Mckenzie Taylor    MR#:  [id number]  DATE OF BIRTH:  1940/08/09  SUBJECTIVE:  Came in with increasing shortness of breath and weight gain.  Patient received IV Lasix.  Diuresing well.  Feels a lot better.  No wheezing.  REVIEW OF SYSTEMS:   Review of Systems  Constitutional: Negative for chills, fever and weight loss.  HENT: Negative for ear discharge, ear pain and nosebleeds.   Eyes: Negative for blurred vision, pain and discharge.  Respiratory: Positive for cough and shortness of breath. Negative for sputum production, wheezing and stridor.   Cardiovascular: Positive for leg swelling. Negative for chest pain, palpitations, orthopnea and PND.  Gastrointestinal: Negative for abdominal pain, diarrhea, nausea and vomiting.  Genitourinary: Negative for frequency and urgency.  Musculoskeletal: Negative for back pain and joint pain.  Neurological: Positive for weakness. Negative for sensory change, speech change and focal weakness.  Psychiatric/Behavioral: Negative for depression and hallucinations. The patient is not nervous/anxious.    Tolerating Diet:yesTolerating PT: pending  DRUG ALLERGIES:   Allergies  Allergen Reactions  . Other Rash    Pt reports allergy to metals.    VITALS:  Blood pressure (!) 143/69, pulse (!) 110, temperature 98 F (36.7 C), temperature source Oral, resp. rate 18, height 5\' 5"  (1.651 m), weight 102 kg (224 lb 12.8 oz), SpO2 95 %.  PHYSICAL EXAMINATION:   Physical Exam  GENERAL:  76 y.o.-year-old patient lying in the bed with no acute distress. obese EYES: Pupils equal, round, reactive to light and accommodation. No scleral icterus. Extraocular muscles intact.  HEENT: Head atraumatic, normocephalic. Oropharynx and nasopharynx clear.  NECK:  Supple, no jugular venous distention. No thyroid enlargement, no tenderness.  LUNGS:decreased breath sounds bilaterally, no wheezing,  rales, rhonchi. No use of accessory muscles of respiration.  CARDIOVASCULAR: S1, S2 normal. No murmurs, rubs, or gallops.  ABDOMEN: Soft, nontender, nondistended. Bowel sounds present. No organomegaly or mass.  EXTREMITIES: No cyanosis, clubbing o ++ edema b/l.    NEUROLOGIC: Cranial nerves II through XII are intact. No focal Motor or sensory deficits b/l.   PSYCHIATRIC:  patient is alert and oriented x 3.  SKIN: No obvious rash, lesion, or ulcer.   LABORATORY PANEL:  CBC  Recent Labs Lab 01/07/17 0413  WBC 8.8  HGB 8.3*  HCT 26.9*  PLT 313    Chemistries   Recent Labs Lab 01/07/17 0413  NA 140  K 3.8  CL 97*  CO2 34*  GLUCOSE 214*  BUN 19  CREATININE 1.34*  CALCIUM 8.5*   Cardiac Enzymes  Recent Labs Lab 01/06/17 0922  TROPONINI <0.03   RADIOLOGY:  Dg Chest 2 View  Result Date: 01/06/2017 CLINICAL DATA:  Chest pain, shortness of breath, chest tightness, former smoking history EXAM: CHEST  2 VIEW COMPARISON:  Chest x-ray of 11/25/2016 FINDINGS: The lungs are not well aerated. There is cardiomegaly present with mild pulmonary vascular congestion and small pleural effusions, consistent with congestive heart failure. No acute bony abnormality is noted. IMPRESSION: CHF with small bilateral pleural effusions. Electronically Signed   By: Ivar Drape M.D.   On: 01/06/2017 10:01   ASSESSMENT AND PLAN:  76 year old female with a history of chronic systolic heart failure ejection fraction 45-50%, chronic hypoxic/hypercapnic respiratory failure on 3 L of oxygen due to COPD and diabetes who presents with weight gain and shortness of breath.  1. Acute on chronic systolic CHF exacerbation, EF  45-50% by echo June 2018 with a substantial amount of weight gain   start IV Lasix 40 mg twice a day --change to oral In am  monitor intake and output and daily weight   continue enalapril and metoprolol. CHF referral upon discharge-- apt made  2. Acute COPD exacerbation with acute  bronchitis: Start Solu-Medrol 40 mg IV every 12--change to oral steroids Continue DuoNeb's Start doxycycline for acute bronchitis  3. Diabetes: Continue Levemir with sliding scale and ADA diet Diabetes nurse consultation  4. GERD: Continue PPI and Carafate  5. Mild CAD: Continue atorvastatin and metoprolol  6. Essential hypertension: Continue Norvasc, enalapril and metoprolol  7. Anemia of chronic disease:   Case discussed with Care Management/Social Worker. Management plans discussed with the patient, family and they are in agreement.  CODE STATUS: limited code  DVT Prophylaxis: lovenox  TOTAL TIME TAKING CARE OF THIS PATIENT: *25* minutes.  >50% time spent on counselling and coordination of care  POSSIBLE D/C IN *1-2* DAYS, DEPENDING ON CLINICAL CONDITION.  Note: This dictation was prepared with Dragon dictation along with smaller phrase technology. Any transcriptional errors that result from this process are unintentional.  Gracelin Weisberg M.D on 01/07/2017 at 2:32 PM  Between 7am to 6pm - Pager - [phone]  After 6pm go to [email or web address] - password [email or web address] [facility]  Office  [phone]  CC: Primary care physician; Denton Lank, MD

## 2017-01-07 NOTE — Progress Notes (Signed)
Patient scheduled an initial appointment at the [facility] on 01/17/17. Of note, he did not show or cancel his last 2 appointments with [place].

## 2017-01-07 NOTE — Progress Notes (Signed)
Inpatient Diabetes Program Recommendations  AACE/ADA: New Consensus Statement on Inpatient Glycemic Control (2015)  Target Ranges:  Prepandial:   less than 140 mg/dL      Peak postprandial:   less than 180 mg/dL (1-2 hours)      Critically ill patients:  140 - 180 mg/dL   Lab Results  Component Value Date   GLUCAP 195 (H) 01/07/2017   HGBA1C 8.5 (H) 01/06/2017    Review of Glycemic Control  Results for Mckenzie Taylor, Mckenzie Taylor (MRN [id number]) as of 01/07/2017 10:00  Ref. Range 01/06/2017 12:02 01/06/2017 18:20 01/06/2017 18:58 01/06/2017 20:53 01/07/2017 07:48  Glucose-Capillary Latest Ref Range: 65 - 99 mg/dL 318 (H) 436 (H) 455 (H) 311 (H) 195 (H)    Diabetes history: Type 2 Outpatient Diabetes medications: Levemir 25 units qhs, Novolog 8-18 units tid,  Current orders for Inpatient glycemic control: Levemir 25 units qhs, Novolog 0-20 units tid * 60 mg Solu-medrol q day  Inpatient Diabetes Program Recommendations:  Consider adding Novolog 3 units tid (hold if she eats less than 50%) and change Novolog correction to moderate correction scale 0-15 units tid.  Please add Novolog 0-5 units qhs.     Gentry Fitz, RN, BA, MHA, CDE Diabetes Coordinator Inpatient Diabetes Program  [phone] (Team Pager) [phone] ([place]) 01/07/2017 10:02 AM

## 2017-01-07 NOTE — Discharge Instructions (Signed)
Heart Failure Clinic appointment on January 17 2017 at 2:00pm with Darylene Price, [place]. Please call [phone] to reschedule.

## 2017-01-08 LAB — GLUCOSE, CAPILLARY: Glucose-Capillary: 171 mg/dL — ABNORMAL HIGH (ref 65–99)

## 2017-01-08 MED ORDER — DOXYCYCLINE HYCLATE 100 MG PO TABS: 100.0000 mg | ORAL_TABLET | Freq: Two times a day (BID) | ORAL | 0 refills | Status: DC

## 2017-01-08 MED ORDER — TORSEMIDE 20 MG PO TABS
10.0000 mg | ORAL_TABLET | Freq: Every day | ORAL | Status: DC
  Administered 2017-01-08: 10 mg via ORAL
  Filled 2017-01-08: qty 1

## 2017-01-08 MED ORDER — PROMETHAZINE HCL 25 MG/ML IJ SOLN: 12.5000 mg | Freq: Four times a day (QID) | INTRAMUSCULAR | Status: DC | PRN

## 2017-01-08 NOTE — Discharge Summary (Signed)
[facility] at [place] NAME: Mckenzie Taylor    MR#:  [id number]  DATE OF BIRTH:  07-31-1940  DATE OF ADMISSION:  01/06/2017 ADMITTING PHYSICIAN: Bettey Costa, MD  DATE OF DISCHARGE: 01/08/2017  PRIMARY CARE PHYSICIAN: Denton Lank, MD    ADMISSION DIAGNOSIS:  COPD exacerbation ([place]) [J44.1] Acute on chronic congestive heart failure, unspecified heart failure type ([place]) [I50.9]  DISCHARGE DIAGNOSIS:  COPD exacerbation  With mild bronchitis Acute on chronic congestive heart failure, unspecified heart failure type ([place]) [I50.9]  SECONDARY DIAGNOSIS:   Past Medical History:  Diagnosis Date  . Autosomal recessive polycystic kidneys   . Avascular necrosis of hip (HCC)    bilateral  . CAD (coronary artery disease)    mild  . CHF (congestive heart failure) ([place])   . Chronic kidney disease    cyst  . COPD (chronic obstructive pulmonary disease) ([place])   . Diabetes type 2, controlled ([place])   . GERD (gastroesophageal reflux disease)   . HTN (hypertension)   . Hypothyroidism    subclinical. low TSH, normal thyroid panel. biopsy 2010  . 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  1. Acute on chronic systolic CHF exacerbation, EF 45-50% by echo June 2018 with a substantial amount of weight gain  start IV Lasix 40 mg twice a day --change to oral torsemide 10 mg (home dose) monitor intake and output and daily weight  -UOP > 3liters continue enalapril and metoprolol. CHF referral upon discharge-- aptt made  2. Acute COPD exacerbation with acute bronchitis: Received  Solu-Medrol while in the hospital. No wheezingContinue DuoNeb's On doxycycline for acute bronchitis  3. Diabetes: Continue  Levemir with sliding scale and ADA diet Diabetes nurse consultation  4. GERD: Continue PPI and Carafate  5. Mild CAD: Continue atorvastatin and metoprolol  6. Essential hypertension: Continue Norvasc, enalapril and metoprolol  7. Anemia of chronic disease:stable  Overall appears at baseline D/c home with out f/u at chf clinic and PCP Pt wears chronic oxygen   CONSULTS OBTAINED:    DRUG ALLERGIES:   Allergies  Allergen Reactions  . Other Rash    Pt reports allergy to metals.    DISCHARGE MEDICATIONS:   Current Discharge Medication List    START taking these medications   Details  doxycycline (VIBRA-TABS) 100 MG tablet Take 1 tablet (100 mg total) by mouth every 12 (twelve) hours. Qty: 10 tablet, Refills: 0      CONTINUE these medications which have NOT CHANGED   Details  amLODipine (NORVASC) 10 MG tablet Take 10 mg by mouth daily.      atorvastatin (LIPITOR) 40 MG tablet Take 40 mg by mouth daily.    Cholecalciferol (VITAMIN D3) 5000 units CAPS Take 1 capsule by mouth daily.    Cyanocobalamin 1500 MCG TBDP Take 3 tablets by mouth daily.    enalapril (VASOTEC) 5 MG tablet Take 5 mg by mouth daily.    FLUoxetine (PROZAC) 40 MG capsule Take 40 mg by mouth.    Fluticasone-Salmeterol (ADVAIR) 250-50 MCG/DOSE AEPB Inhale 1 puff into the lungs 2 (two) times daily.    gabapentin (NEURONTIN) 100 MG capsule Take 200 mg by mouth 3 (three) times daily.    LEVEMIR FLEXTOUCH 100 UNIT/ML  Pen Inject 25 Units into the skin at bedtime.     metoprolol tartrate (LOPRESSOR) 25 MG tablet Take 1 tablet (25 mg total) by mouth 2 (two) times daily. Qty: 60 tablet, Refills: 0    NOVOLOG FLEXPEN 100 UNIT/ML FlexPen Inject 12-18 Units into the skin 3 (three) times daily as needed. Sliding scale if blood sugar is above 200.    omeprazole (PRILOSEC) 20 MG capsule Take 20 mg by mouth daily.    promethazine (PHENERGAN) 25 MG tablet Take 1 tablet by mouth daily.    sucralfate  (CARAFATE) 1 g tablet Take 1 tablet (1 g total) by mouth 4 (four) times daily. Qty: 120 tablet, Refills: 1    tiotropium (SPIRIVA) 18 MCG inhalation capsule Place 18 mcg into inhaler and inhale daily.    tiZANidine (ZANAFLEX) 4 MG capsule Take 4 mg by mouth 3 (three) times daily as needed for muscle spasms.    torsemide (DEMADEX) 10 MG tablet Take 10 mg by mouth daily.    traZODone (DESYREL) 50 MG tablet Take 50 mg by mouth at bedtime.    albuterol (VENTOLIN HFA) 108 (90 BASE) MCG/ACT inhaler Inhale 2 puffs into the lungs every 4 (four) hours as needed.     ipratropium-albuterol (DUONEB) 0.5-2.5 (3) MG/3ML SOLN Take 3 mLs by nebulization 4 (four) times daily. Qty: 360 mL, Refills: 0      STOP taking these medications     albuterol (PROVENTIL) (2.5 MG/3ML) 0.083% nebulizer solution      levofloxacin (LEVAQUIN) 250 MG tablet      predniSONE (STERAPRED UNI-PAK 21 TAB) 10 MG (21) TBPK tablet         If you experience worsening of your admission symptoms, develop shortness of breath, life threatening emergency, suicidal or homicidal thoughts you must seek medical attention immediately by calling 911 or calling your MD immediately  if symptoms less severe.  You Must read complete instructions/literature along with all the possible adverse reactions/side effects for all the Medicines you take and that have been prescribed to you. Take any new Medicines after you have completely understood and accept all the possible adverse reactions/side effects.   Please note  You were cared for by a hospitalist during your hospital stay. If you have any questions about your discharge medications or the care you received while you were in the hospital after you are discharged, you can call the unit and asked to speak with the hospitalist on call if the hospitalist that took care of you is not available. Once you are discharged, your primary care physician will handle any further medical issues. Please  note that NO REFILLS for any discharge medications will be authorized once you are discharged, as it is imperative that you return to your primary care physician (or establish a relationship with a primary care physician if you do not have one) for your aftercare needs so that they can reassess your need for medications and monitor your lab values. Today   SUBJECTIVE  Patient feels a lot better.  currently on 3 L nasal cannula oxygen Which he uses at home  VITAL SIGNS:  Blood pressure 106/79, pulse (!) 104, temperature 97.9 F (36.6 C), temperature source Oral, resp. rate 18, height 5\' 5"  (1.651 m), weight 100 kg (220 lb 8 oz), SpO2 90 %.  I/O:   Intake/Output Summary (Last 24 hours) at 01/08/17 0753 Last data filed at 01/08/17 0426  Gross per 24 hour  Intake  720 ml  Output             1500 ml  Net             -780 ml    PHYSICAL EXAMINATION:  GENERAL:  76 y.o.-year-old patient lying in the bed with no acute distress.  Obese EYES: Pupils equal, round, reactive to light and accommodation. No scleral icterus. Extraocular muscles intact.  HEENT: Head atraumatic, normocephalic. Oropharynx and nasopharynx clear.  NECK:  Supple, no jugular venous distention. No thyroid enlargement, no tenderness.  LUNGS: Decreased breath sounds bilaterally, no wheezing, rales,rhonchi or crepitation. No use of accessory muscles of respiration.  CARDIOVASCULAR: S1, S2 normal. No murmurs, rubs, or gallops.  ABDOMEN: Soft, non-tender, non-distended. Bowel sounds present. No organomegaly or mass.  EXTREMITIES: Chronic 1+ pedal edema, no cyanosis, or clubbing.  NEUROLOGIC: Cranial nerves II through XII are intact. Muscle strength 5/5 in all extremities. Sensation intact. Gait not checked.  PSYCHIATRIC: The patient is alert and oriented x 3.  SKIN: No obvious rash, lesion, or ulcer.   DATA REVIEW:   CBC   Recent Labs Lab 01/07/17 0413  WBC 8.8  HGB 8.3*  HCT 26.9*  PLT 313     Chemistries   Recent Labs Lab 01/07/17 0413  NA 140  K 3.8  CL 97*  CO2 34*  GLUCOSE 214*  BUN 19  CREATININE 1.34*  CALCIUM 8.5*    Microbiology Results   No results found for this or any previous visit (from the past 240 hour(s)).  RADIOLOGY:  Dg Chest 2 View  Result Date: 01/06/2017 CLINICAL DATA:  Chest pain, shortness of breath, chest tightness, former smoking history EXAM: CHEST  2 VIEW COMPARISON:  Chest x-ray of 11/25/2016 FINDINGS: The lungs are not well aerated. There is cardiomegaly present with mild pulmonary vascular congestion and small pleural effusions, consistent with congestive heart failure. No acute bony abnormality is noted. IMPRESSION: CHF with small bilateral pleural effusions. Electronically Signed   By: Ivar Drape M.D.   On: 01/06/2017 10:01     Management plans discussed with the patient, family and they are in agreement.  CODE STATUS:     Code Status Orders        Start     Ordered   01/07/17 1624  Do not attempt resuscitation (DNR)  Continuous    Question Answer Comment  In the event of cardiac or respiratory ARREST Do not call a "code blue"   In the event of cardiac or respiratory ARREST Do not perform Intubation, CPR, defibrillation or ACLS   In the event of cardiac or respiratory ARREST Use medication by any route, position, wound care, and other measures to relive pain and suffering. May use oxygen, suction and manual treatment of airway obstruction as needed for comfort.      01/07/17 1624    Code Status History    Date Active Date Inactive Code Status Order ID Comments User Context   01/06/2017  6:38 PM 01/07/2017  4:24 PM Partial Code [id number]  Vernell Morgans, RN Inpatient   01/06/2017  3:48 PM 01/06/2017  6:38 PM Full Code [id number]  Bettey Costa, MD Inpatient   01/06/2017 12:33 PM 01/06/2017  3:48 PM DNR [id number]  Bettey Costa, MD Inpatient   11/26/2016 10:03 AM 11/27/2016  3:45 PM DNR [id number]  Hillary Bow, MD  Inpatient   11/25/2016  9:26 PM 11/26/2016 10:03 AM Full Code [id number]  Bettey Costa, MD Inpatient   10/25/2016  4:14 PM  10/29/2016  5:25 PM Full Code [id number]  Henreitta Leber, MD ED   08/14/2016 11:32 PM 08/19/2016  4:57 PM Full Code [id number]  Lance Coon, MD Inpatient   05/05/2016  7:47 AM 05/07/2016  6:04 PM Full Code [id number]  Flora Lipps, MD ED   03/24/2016  2:16 PM 03/28/2016  2:24 PM Full Code [id number]  Awilda Bill, NP ED    Advance Directive Documentation     Most Recent Value  Type of Advance Directive  Living will  Pre-existing out of facility DNR order (yellow form or pink MOST form)  -  "MOST" Form in Place?  -      TOTAL TIME TAKING CARE OF THIS PATIENT: 40 minutes.    Sariya Trickey M.D on 01/08/2017 at 7:53 AM  Between 7am to 6pm - Pager - [phone] After 6pm go to [email or web address] - password [email or web address] [facility]  Office  [phone]  CC: Primary care physician; Denton Lank, MD

## 2017-01-08 NOTE — Progress Notes (Signed)
IV and tele removed from patient. Discharge instructions given to patient. Verbalized understanding. No distress at this time. Family to transport patient home.

## 2017-01-08 NOTE — Care Management Note (Addendum)
Case Management Note  Patient Details  Name: Mckenzie Taylor MRN: [id number] Date of Birth: [birth year]  Subjective/Objective:             Action/Plan:   Expected Discharge Date:  01/08/17               Expected Discharge Plan:     In-House Referral:     Discharge planning Services     Post Acute Care Choice:    Choice offered to:     DME Arranged:    DME Agency:     HH Arranged:    [place] Agency:     Status of Service:     If discussed at [employer] of Stay Meetings, dates discussed:    Additional Comments:  Muscab Brenneman A, RN 01/08/2017, 9:24 AM

## 2017-01-17 ENCOUNTER — Ambulatory Visit: Payer: Medicare Other | Admitting: Family

## 2017-01-17 ENCOUNTER — Telehealth: Payer: Self-pay | Admitting: Family

## 2017-01-17 NOTE — Telephone Encounter (Signed)
Patient did not show for his Heart Failure Clinic appointment on 01/17/17. Will attempt to reschedule.   Of note, this is the 4th appointment that he has missed.

## 2017-08-09 ENCOUNTER — Other Ambulatory Visit: Payer: Self-pay | Admitting: Family Medicine

## 2017-08-09 DIAGNOSIS — D1739 Benign lipomatous neoplasm of skin and subcutaneous tissue of other sites: Secondary | ICD-10-CM

## 2017-08-19 ENCOUNTER — Ambulatory Visit: Payer: Medicare Other

## 2017-08-25 ENCOUNTER — Ambulatory Visit
Admission: RE | Admit: 2017-08-25 | Discharge: 2017-08-25 | Disposition: A | Discharge status: Home / Self Care | Payer: Medicare Other | Source: Ambulatory Visit | Attending: Family Medicine | Admitting: Family Medicine

## 2018-01-04 ENCOUNTER — Inpatient Hospital Stay
Admission: EM | Admit: 2018-01-04 | Discharge: 2018-01-10 | DRG: 291 | Disposition: A | Discharge status: Home / Self Care | Payer: Medicare Other | Attending: Internal Medicine | Admitting: Internal Medicine

## 2018-01-04 ENCOUNTER — Other Ambulatory Visit: Payer: Self-pay

## 2018-01-04 ENCOUNTER — Emergency Department: Payer: Medicare Other

## 2018-01-04 DIAGNOSIS — Z8249 Family history of ischemic heart disease and other diseases of the circulatory system: Secondary | ICD-10-CM

## 2018-01-04 DIAGNOSIS — R52 Pain, unspecified: Secondary | ICD-10-CM

## 2018-01-04 DIAGNOSIS — E875 Hyperkalemia: Secondary | ICD-10-CM | POA: Diagnosis present

## 2018-01-04 DIAGNOSIS — D631 Anemia in chronic kidney disease: Secondary | ICD-10-CM | POA: Diagnosis present

## 2018-01-04 DIAGNOSIS — Z23 Encounter for immunization: Secondary | ICD-10-CM

## 2018-01-04 DIAGNOSIS — J441 Chronic obstructive pulmonary disease with (acute) exacerbation: Secondary | ICD-10-CM | POA: Diagnosis present

## 2018-01-04 DIAGNOSIS — E611 Iron deficiency: Secondary | ICD-10-CM | POA: Diagnosis present

## 2018-01-04 DIAGNOSIS — I5043 Acute on chronic combined systolic (congestive) and diastolic (congestive) heart failure: Secondary | ICD-10-CM | POA: Diagnosis present

## 2018-01-04 DIAGNOSIS — R0602 Shortness of breath: Secondary | ICD-10-CM | POA: Diagnosis present

## 2018-01-04 DIAGNOSIS — T380X5A Adverse effect of glucocorticoids and synthetic analogues, initial encounter: Secondary | ICD-10-CM | POA: Diagnosis present

## 2018-01-04 DIAGNOSIS — Z6838 Body mass index (BMI) 38.0-38.9, adult: Secondary | ICD-10-CM

## 2018-01-04 DIAGNOSIS — J9621 Acute and chronic respiratory failure with hypoxia: Secondary | ICD-10-CM | POA: Diagnosis present

## 2018-01-04 DIAGNOSIS — Z79891 Long term (current) use of opiate analgesic: Secondary | ICD-10-CM

## 2018-01-04 DIAGNOSIS — E1122 Type 2 diabetes mellitus with diabetic chronic kidney disease: Secondary | ICD-10-CM | POA: Diagnosis present

## 2018-01-04 DIAGNOSIS — Z888 Allergy status to other drugs, medicaments and biological substances status: Secondary | ICD-10-CM

## 2018-01-04 DIAGNOSIS — K219 Gastro-esophageal reflux disease without esophagitis: Secondary | ICD-10-CM | POA: Diagnosis present

## 2018-01-04 DIAGNOSIS — I2510 Atherosclerotic heart disease of native coronary artery without angina pectoris: Secondary | ICD-10-CM | POA: Diagnosis present

## 2018-01-04 DIAGNOSIS — L03311 Cellulitis of abdominal wall: Secondary | ICD-10-CM | POA: Diagnosis present

## 2018-01-04 DIAGNOSIS — Z87891 Personal history of nicotine dependence: Secondary | ICD-10-CM | POA: Diagnosis not present

## 2018-01-04 DIAGNOSIS — Q613 Polycystic kidney, unspecified: Secondary | ICD-10-CM

## 2018-01-04 DIAGNOSIS — Z9049 Acquired absence of other specified parts of digestive tract: Secondary | ICD-10-CM

## 2018-01-04 DIAGNOSIS — Z794 Long term (current) use of insulin: Secondary | ICD-10-CM

## 2018-01-04 DIAGNOSIS — N183 Chronic kidney disease, stage 3 (moderate): Secondary | ICD-10-CM | POA: Diagnosis present

## 2018-01-04 DIAGNOSIS — N179 Acute kidney failure, unspecified: Secondary | ICD-10-CM | POA: Diagnosis present

## 2018-01-04 DIAGNOSIS — Z825 Family history of asthma and other chronic lower respiratory diseases: Secondary | ICD-10-CM

## 2018-01-04 DIAGNOSIS — Z801 Family history of malignant neoplasm of trachea, bronchus and lung: Secondary | ICD-10-CM

## 2018-01-04 DIAGNOSIS — Z96643 Presence of artificial hip joint, bilateral: Secondary | ICD-10-CM | POA: Diagnosis present

## 2018-01-04 DIAGNOSIS — I130 Hypertensive heart and chronic kidney disease with heart failure and stage 1 through stage 4 chronic kidney disease, or unspecified chronic kidney disease: Principal | ICD-10-CM | POA: Diagnosis present

## 2018-01-04 DIAGNOSIS — Z7951 Long term (current) use of inhaled steroids: Secondary | ICD-10-CM

## 2018-01-04 DIAGNOSIS — Z79899 Other long term (current) drug therapy: Secondary | ICD-10-CM

## 2018-01-04 DIAGNOSIS — R0902 Hypoxemia: Secondary | ICD-10-CM

## 2018-01-04 LAB — BASIC METABOLIC PANEL
Anion gap: 13 (ref 5–15)
BUN: 20 mg/dL (ref 8–23)
CO2: 28 mmol/L (ref 22–32)
Calcium: 7.8 mg/dL — ABNORMAL LOW (ref 8.9–10.3)
Chloride: 99 mmol/L (ref 98–111)
Creatinine, Ser: 1.98 mg/dL — ABNORMAL HIGH (ref 0.44–1.00)
GFR calc Af Amer: 27 mL/min — ABNORMAL LOW (ref >60)
GFR calc non Af Amer: 24 mL/min — ABNORMAL LOW (ref >60)
Glucose, Bld: 194 mg/dL — ABNORMAL HIGH (ref 70–99)
Potassium: 4.1 mmol/L (ref 3.5–5.1)
Sodium: 140 mmol/L (ref 135–145)

## 2018-01-04 LAB — CBC WITH DIFFERENTIAL/PLATELET
Abs Immature Granulocytes: 0.17 10*3/uL — ABNORMAL HIGH (ref 0.00–0.07)
Basophils Absolute: 0.1 10*3/uL (ref 0.0–0.1)
Basophils Relative: 1 %
Eosinophils Absolute: 0.3 10*3/uL (ref 0.0–0.5)
Eosinophils Relative: 2 %
HCT: 31.1 % — ABNORMAL LOW (ref 36.0–46.0)
Hemoglobin: 9.1 g/dL — ABNORMAL LOW (ref 12.0–15.0)
Immature Granulocytes: 2 %
Lymphocytes Relative: 24 %
Lymphs Abs: 2.6 10*3/uL (ref 0.7–4.0)
MCH: 27 pg (ref 26.0–34.0)
MCHC: 29.3 g/dL — ABNORMAL LOW (ref 30.0–36.0)
MCV: 92.3 fL (ref 80.0–100.0)
Monocytes Absolute: 0.7 10*3/uL (ref 0.1–1.0)
Monocytes Relative: 7 %
Neutro Abs: 7.2 10*3/uL (ref 1.7–7.7)
Neutrophils Relative %: 64 %
Platelets: 312 10*3/uL (ref 150–400)
RBC: 3.37 MIL/uL — ABNORMAL LOW (ref 3.87–5.11)
RDW: 16.3 % — ABNORMAL HIGH (ref 11.5–15.5)
WBC: 11 10*3/uL — ABNORMAL HIGH (ref 4.0–10.5)
nRBC: 0 % (ref 0.0–0.2)

## 2018-01-04 LAB — GLUCOSE, CAPILLARY
Glucose-Capillary: 353 mg/dL — ABNORMAL HIGH (ref 70–99)
Glucose-Capillary: 374 mg/dL — ABNORMAL HIGH (ref 70–99)

## 2018-01-04 LAB — BRAIN NATRIURETIC PEPTIDE: B Natriuretic Peptide: 40 pg/mL (ref 0.0–100.0)

## 2018-01-04 MED ORDER — TRAZODONE HCL 50 MG PO TABS
50.0000 mg | ORAL_TABLET | Freq: Every day | ORAL | Status: DC
  Administered 2018-01-04 – 2018-01-09 (×6): 50 mg via ORAL
  Filled 2018-01-04 (×6): qty 1

## 2018-01-04 MED ORDER — TIZANIDINE HCL 2 MG PO TABS
4.0000 mg | ORAL_TABLET | Freq: Three times a day (TID) | ORAL | Status: DC | PRN
  Filled 2018-01-04: qty 2

## 2018-01-04 MED ORDER — GABAPENTIN 100 MG PO CAPS
200.0000 mg | ORAL_CAPSULE | Freq: Three times a day (TID) | ORAL | Status: DC
  Administered 2018-01-04 – 2018-01-10 (×18): 200 mg via ORAL
  Filled 2018-01-04 (×18): qty 2

## 2018-01-04 MED ORDER — INFLUENZA VAC SPLIT HIGH-DOSE 0.5 ML IM SUSY
0.5000 mL | PREFILLED_SYRINGE | INTRAMUSCULAR | Status: AC
  Administered 2018-01-05: 10:00:00 0.5 mL via INTRAMUSCULAR
  Filled 2018-01-04: qty 0.5

## 2018-01-04 MED ORDER — INSULIN ASPART 100 UNIT/ML ~~LOC~~ SOLN
0.0000 [IU] | Freq: Every day | SUBCUTANEOUS | Status: DC
  Administered 2018-01-04: 22:00:00 5 [IU] via SUBCUTANEOUS
  Administered 2018-01-07: 2 [IU] via SUBCUTANEOUS
  Administered 2018-01-09: 23:00:00 3 [IU] via SUBCUTANEOUS
  Administered 2018-01-09: 5 [IU] via SUBCUTANEOUS
  Filled 2018-01-04 (×5): qty 1

## 2018-01-04 MED ORDER — ALBUTEROL SULFATE (2.5 MG/3ML) 0.083% IN NEBU
2.5000 mg | INHALATION_SOLUTION | RESPIRATORY_TRACT | Status: DC | PRN
  Administered 2018-01-07 – 2018-01-08 (×3): 2.5 mg via RESPIRATORY_TRACT
  Filled 2018-01-04 (×3): qty 3

## 2018-01-04 MED ORDER — ENOXAPARIN SODIUM 40 MG/0.4ML ~~LOC~~ SOLN
30.0000 mg | SUBCUTANEOUS | Status: DC
  Administered 2018-01-04: 30 mg via SUBCUTANEOUS
  Filled 2018-01-04: qty 0.4

## 2018-01-04 MED ORDER — PROMETHAZINE HCL 25 MG PO TABS
25.0000 mg | ORAL_TABLET | Freq: Once | ORAL | Status: AC
  Administered 2018-01-04: 25 mg via ORAL
  Filled 2018-01-04: qty 1

## 2018-01-04 MED ORDER — DOXYCYCLINE HYCLATE 100 MG PO TABS
100.0000 mg | ORAL_TABLET | Freq: Two times a day (BID) | ORAL | Status: DC
  Administered 2018-01-04 – 2018-01-07 (×7): 100 mg via ORAL
  Filled 2018-01-04 (×9): qty 1

## 2018-01-04 MED ORDER — ACETAMINOPHEN 325 MG PO TABS: 650.0000 mg | ORAL_TABLET | Freq: Four times a day (QID) | ORAL | Status: DC | PRN

## 2018-01-04 MED ORDER — OXYCODONE-ACETAMINOPHEN 10-325 MG PO TABS: 1.0000 | ORAL_TABLET | Freq: Three times a day (TID) | ORAL | Status: DC | PRN

## 2018-01-04 MED ORDER — MOMETASONE FURO-FORMOTEROL FUM 200-5 MCG/ACT IN AERO
2.0000 | INHALATION_SPRAY | Freq: Two times a day (BID) | RESPIRATORY_TRACT | Status: DC
  Administered 2018-01-05 – 2018-01-10 (×10): 2 via RESPIRATORY_TRACT
  Filled 2018-01-04 (×2): qty 8.8

## 2018-01-04 MED ORDER — INSULIN ASPART 100 UNIT/ML ~~LOC~~ SOLN
0.0000 [IU] | Freq: Three times a day (TID) | SUBCUTANEOUS | Status: DC
  Administered 2018-01-04: 19:00:00 20 [IU] via SUBCUTANEOUS
  Administered 2018-01-05: 0.3 [IU] via SUBCUTANEOUS
  Administered 2018-01-05: 09:00:00 5 [IU] via SUBCUTANEOUS
  Administered 2018-01-05: 13:00:00 4 [IU] via SUBCUTANEOUS
  Administered 2018-01-06: 10:00:00 3 [IU] via SUBCUTANEOUS
  Administered 2018-01-06: 7 [IU] via SUBCUTANEOUS
  Administered 2018-01-06: 20 [IU] via SUBCUTANEOUS
  Administered 2018-01-07: 15 [IU] via SUBCUTANEOUS
  Administered 2018-01-07: 20 [IU] via SUBCUTANEOUS
  Administered 2018-01-07: 11 [IU] via SUBCUTANEOUS
  Administered 2018-01-08: 4 [IU] via SUBCUTANEOUS
  Administered 2018-01-08: 11 [IU] via SUBCUTANEOUS
  Administered 2018-01-09: 7 [IU] via SUBCUTANEOUS
  Administered 2018-01-09: 17:00:00 4 [IU] via SUBCUTANEOUS
  Administered 2018-01-09: 15 [IU] via SUBCUTANEOUS
  Administered 2018-01-10: 4 [IU] via SUBCUTANEOUS
  Filled 2018-01-04 (×16): qty 1

## 2018-01-04 MED ORDER — IPRATROPIUM-ALBUTEROL 0.5-2.5 (3) MG/3ML IN SOLN
3.0000 mL | Freq: Four times a day (QID) | RESPIRATORY_TRACT | Status: DC
  Administered 2018-01-04 – 2018-01-05 (×3): 3 mL via RESPIRATORY_TRACT
  Filled 2018-01-04 (×3): qty 3

## 2018-01-04 MED ORDER — POLYETHYLENE GLYCOL 3350 17 G PO PACK: 17.0000 g | PACK | Freq: Every day | ORAL | Status: DC | PRN

## 2018-01-04 MED ORDER — ONDANSETRON HCL 4 MG PO TABS
4.0000 mg | ORAL_TABLET | Freq: Four times a day (QID) | ORAL | Status: DC | PRN
  Administered 2018-01-08: 23:00:00 4 mg via ORAL
  Filled 2018-01-04: qty 1

## 2018-01-04 MED ORDER — INSULIN DETEMIR 100 UNIT/ML ~~LOC~~ SOLN
32.0000 [IU] | Freq: Every day | SUBCUTANEOUS | Status: DC
  Administered 2018-01-04: 32 [IU] via SUBCUTANEOUS
  Filled 2018-01-04 (×2): qty 0.32

## 2018-01-04 MED ORDER — OXYCODONE-ACETAMINOPHEN 5-325 MG PO TABS
1.0000 | ORAL_TABLET | ORAL | Status: DC | PRN
  Administered 2018-01-04 – 2018-01-09 (×14): 1 via ORAL
  Filled 2018-01-04 (×14): qty 1

## 2018-01-04 MED ORDER — SODIUM CHLORIDE 0.9 % IV SOLN
1.0000 g | Freq: Once | INTRAVENOUS | Status: AC
  Administered 2018-01-04: 1 g via INTRAVENOUS
  Filled 2018-01-04: qty 10

## 2018-01-04 MED ORDER — FLUOXETINE HCL 20 MG PO CAPS
40.0000 mg | ORAL_CAPSULE | Freq: Every evening | ORAL | Status: DC
  Administered 2018-01-05 – 2018-01-09 (×5): 40 mg via ORAL
  Filled 2018-01-04 (×7): qty 2

## 2018-01-04 MED ORDER — OXYCODONE HCL 5 MG PO TABS
5.0000 mg | ORAL_TABLET | ORAL | Status: DC | PRN
  Administered 2018-01-04 – 2018-01-09 (×15): 5 mg via ORAL
  Filled 2018-01-04 (×15): qty 1

## 2018-01-04 MED ORDER — FUROSEMIDE 10 MG/ML IJ SOLN
60.0000 mg | Freq: Once | INTRAMUSCULAR | Status: AC
  Administered 2018-01-04: 16:00:00 60 mg via INTRAVENOUS
  Filled 2018-01-04: qty 6

## 2018-01-04 MED ORDER — ORAL CARE MOUTH RINSE
15.0000 mL | Freq: Two times a day (BID) | OROMUCOSAL | Status: DC
  Administered 2018-01-04 – 2018-01-10 (×11): 15 mL via OROMUCOSAL

## 2018-01-04 MED ORDER — SODIUM CHLORIDE 0.9 % IV BOLUS: 500.0000 mL | Freq: Once | INTRAVENOUS | Status: DC

## 2018-01-04 MED ORDER — AMLODIPINE BESYLATE 10 MG PO TABS
10.0000 mg | ORAL_TABLET | Freq: Every day | ORAL | Status: DC
  Administered 2018-01-04 – 2018-01-07 (×4): 10 mg via ORAL
  Filled 2018-01-04 (×4): qty 1

## 2018-01-04 MED ORDER — SODIUM CHLORIDE 0.9% FLUSH
3.0000 mL | Freq: Two times a day (BID) | INTRAVENOUS | Status: DC
  Administered 2018-01-04 – 2018-01-08 (×9): 3 mL via INTRAVENOUS

## 2018-01-04 MED ORDER — PANTOPRAZOLE SODIUM 40 MG PO TBEC
40.0000 mg | DELAYED_RELEASE_TABLET | Freq: Every day | ORAL | Status: DC
  Administered 2018-01-04 – 2018-01-10 (×7): 40 mg via ORAL
  Filled 2018-01-04 (×7): qty 1

## 2018-01-04 MED ORDER — SODIUM CHLORIDE 0.9 % IV SOLN
500.0000 mg | Freq: Once | INTRAVENOUS | Status: AC
  Administered 2018-01-04: 500 mg via INTRAVENOUS
  Filled 2018-01-04: qty 500

## 2018-01-04 MED ORDER — METOPROLOL SUCCINATE ER 25 MG PO TB24
25.0000 mg | ORAL_TABLET | Freq: Every day | ORAL | Status: DC
  Administered 2018-01-04 – 2018-01-10 (×7): 25 mg via ORAL
  Filled 2018-01-04 (×7): qty 1

## 2018-01-04 MED ORDER — METOLAZONE 2.5 MG PO TABS
2.5000 mg | ORAL_TABLET | Freq: Every day | ORAL | Status: DC
  Administered 2018-01-05: 2.5 mg via ORAL
  Filled 2018-01-04 (×2): qty 1

## 2018-01-04 MED ORDER — ACETAMINOPHEN 650 MG RE SUPP: 650.0000 mg | Freq: Four times a day (QID) | RECTAL | Status: DC | PRN

## 2018-01-04 MED ORDER — ATORVASTATIN CALCIUM 20 MG PO TABS
40.0000 mg | ORAL_TABLET | Freq: Every evening | ORAL | Status: DC
  Administered 2018-01-04 – 2018-01-09 (×6): 40 mg via ORAL
  Filled 2018-01-04 (×6): qty 2

## 2018-01-04 MED ORDER — PROMETHAZINE HCL 25 MG PO TABS
25.0000 mg | ORAL_TABLET | Freq: Four times a day (QID) | ORAL | Status: DC | PRN
  Administered 2018-01-05 – 2018-01-09 (×7): 25 mg via ORAL
  Filled 2018-01-04 (×9): qty 1

## 2018-01-04 MED ORDER — BUSPIRONE HCL 5 MG PO TABS
5.0000 mg | ORAL_TABLET | Freq: Two times a day (BID) | ORAL | Status: DC
  Administered 2018-01-05 – 2018-01-10 (×11): 5 mg via ORAL
  Filled 2018-01-04 (×15): qty 1

## 2018-01-04 MED ORDER — ONDANSETRON HCL 4 MG/2ML IJ SOLN
4.0000 mg | Freq: Four times a day (QID) | INTRAMUSCULAR | Status: DC | PRN
  Administered 2018-01-04 – 2018-01-08 (×4): 4 mg via INTRAVENOUS
  Filled 2018-01-04 (×5): qty 2

## 2018-01-04 MED ORDER — TIOTROPIUM BROMIDE MONOHYDRATE 18 MCG IN CAPS
18.0000 ug | ORAL_CAPSULE | Freq: Every day | RESPIRATORY_TRACT | Status: DC
  Administered 2018-01-06 – 2018-01-10 (×5): 18 ug via RESPIRATORY_TRACT
  Filled 2018-01-04 (×2): qty 5

## 2018-01-04 MED ORDER — PNEUMOCOCCAL VAC POLYVALENT 25 MCG/0.5ML IJ INJ
0.5000 mL | INJECTION | INTRAMUSCULAR | Status: AC
  Administered 2018-01-05: 0.5 mL via INTRAMUSCULAR
  Filled 2018-01-04: qty 0.5

## 2018-01-04 MED ORDER — MAGNESIUM SULFATE 2 GM/50ML IV SOLN
2.0000 g | INTRAVENOUS | Status: AC
  Administered 2018-01-04: 2 g via INTRAVENOUS
  Filled 2018-01-04: qty 50

## 2018-01-04 NOTE — ED Triage Notes (Addendum)
Pt comes via  [facility] with c/o [facility] that started yesterday. EMS reports given 2 duonebs to patient. EMS also gave 125 solumedrol and 1 atrovent. Pt reports around this time she gets pneumonia. Pt has hx of COPD and wears 3 L. Grandaughter reports she bumped up O2 to 4L since being concerned with pt's breathing. Pt also reports lower left sore that is red and painful and states she has had it for about a month. Pt is A&OX4. VSS.

## 2018-01-04 NOTE — ED Notes (Signed)
Pt up to toilet 

## 2018-01-04 NOTE — H&P (Signed)
[facility] [place] NAME: Mckenzie Taylor    MR#:  [id number]  DATE OF BIRTH:  12-27-1940  DATE OF ADMISSION:  01/04/2018  PRIMARY CARE PHYSICIAN: Denton Lank, MD   REQUESTING/REFERRING PHYSICIAN: Dr. Joni Fears  CHIEF COMPLAINT:   Chief Complaint  Patient presents with  . Shortness of Breath    HISTORY OF PRESENT ILLNESS:  Mckenzie Taylor  is a 77 y.o. female with a known history of diastolic CHF, COPD, chronic respiratory failure, hypertension, diabetes mellitus, morbid obesity presents to the hospital complaining of 2 weeks of progressively worsening shortness of breath, wheezing, orthopnea, lower extremity edema and 7 pound weight gain.  Patient bumped her oxygen from 3 L to 4 L yesterday with no improvement and continued her nebulizers.  Here in the emergency room she has received IV steroids, 2 rounds of DuoNeb with no significant improvement and is being admitted to the hospital.  Chest x-ray shows mild pulmonary edema but no infiltrates.  PAST MEDICAL HISTORY:   Past Medical History:  Diagnosis Date  . Autosomal recessive polycystic kidneys   . Avascular necrosis of hip (HCC)    bilateral  . CAD (coronary artery disease)    mild  . CHF (congestive heart failure) ([place])   . Chronic kidney disease    cyst  . COPD (chronic obstructive pulmonary disease) ([place])   . Diabetes type 2, controlled ([place])   . GERD (gastroesophageal reflux disease)   . HTN (hypertension)   . Hypothyroidism    subclinical. low TSH, normal thyroid panel. biopsy 2010  . Vitamin B12 deficiency     PAST SURGICAL HISTORY:   Past Surgical History:  Procedure Laterality Date  . CHOLECYSTECTOMY    . hip replacement     bilateral-secondary to avascular necrosis  . right eye lens replacement    . ULNAR NERVE REPAIR     bilateral  . VESICOVAGINAL FISTULA CLOSURE W/ TAH      SOCIAL HISTORY:   Social History   Tobacco Use  . Smoking status: Former  Smoker    Packs/day: 1.00    Years: 50.00    Pack years: 50.00  . Smokeless tobacco: Never Used  . Tobacco comment: 1 ppd - 50 years   Substance Use Topics  . Alcohol use: No    FAMILY HISTORY:   Family History  Problem Relation Age of Onset  . Cancer Father        lung  . COPD Mother   . Heart disease Mother   . Heart disease Maternal Uncle    DRUG ALLERGIES:   Allergies  Allergen Reactions  . Other Rash    Pt reports allergy to metals.   REVIEW OF SYSTEMS:   Review of Systems  Constitutional: Positive for malaise/fatigue. Negative for chills and fever.  HENT: Negative for sore throat.   Eyes: Negative for blurred vision, double vision and pain.  Respiratory: Positive for cough, shortness of breath and wheezing. Negative for hemoptysis.   Cardiovascular: Positive for orthopnea and leg swelling. Negative for chest pain and palpitations.  Gastrointestinal: Negative for abdominal pain, constipation, diarrhea, heartburn, nausea and vomiting.  Genitourinary: Negative for dysuria and hematuria.  Musculoskeletal: Negative for back pain and joint pain.  Skin: Negative for rash.  Neurological: Negative for sensory change, speech change, focal weakness and headaches.  Endo/Heme/Allergies: Does not bruise/bleed easily.  Psychiatric/Behavioral: Negative for depression. The patient is not nervous/anxious.    MEDICATIONS AT HOME:   Prior  to Admission medications   Medication Sig Start Date End Date Taking? Authorizing Provider  albuterol (VENTOLIN HFA) 108 (90 BASE) MCG/ACT inhaler Inhale 2 puffs into the lungs every 4 (four) hours as needed.    Yes [provider]  amLODipine (NORVASC) 10 MG tablet Take 10 mg by mouth daily.     Yes [provider]  atorvastatin (LIPITOR) 40 MG tablet Take 40 mg by mouth daily.   Yes [provider]  busPIRone (BUSPAR) 5 MG tablet Take 5 mg by mouth 2 (two) times daily.   Yes [provider]  Cholecalciferol  (VITAMIN D3) 5000 units CAPS Take 1 capsule by mouth daily.   Yes [provider]  FLUoxetine (PROZAC) 40 MG capsule Take 40 mg by mouth every evening.    Yes [provider]  Fluticasone-Salmeterol (ADVAIR) 250-50 MCG/DOSE AEPB Inhale 1 puff into the lungs 2 (two) times daily.   Yes [provider]  gabapentin (NEURONTIN) 100 MG capsule Take 200 mg by mouth 3 (three) times daily.   Yes [provider]  ipratropium-albuterol (DUONEB) 0.5-2.5 (3) MG/3ML SOLN Take 3 mLs by nebulization 4 (four) times daily. 03/28/16  Yes Vaughan Basta, MD  LEVEMIR FLEXTOUCH 100 UNIT/ML Pen Inject 32 Units into the skin at bedtime.    Yes [provider]  metolazone (ZAROXOLYN) 2.5 MG tablet Take 2.5 mg by mouth daily.   Yes [provider]  metoprolol tartrate (LOPRESSOR) 25 MG tablet Take 1 tablet (25 mg total) by mouth 2 (two) times daily. Patient taking differently: Take 25 mg by mouth daily.  08/19/16  Yes Vaughan Basta, MD  NOVOLOG FLEXPEN 100 UNIT/ML FlexPen Inject 12-18 Units into the skin 3 (three) times daily as needed. Sliding scale if blood sugar is above 200. 04/02/16  Yes [provider]  omeprazole (PRILOSEC) 20 MG capsule Take 20 mg by mouth daily.   Yes [provider]  oxyCODONE-acetaminophen (PERCOCET) 10-325 MG tablet Take 1 tablet by mouth 2 (two) times daily. 12/08/17  Yes [provider]  promethazine (PHENERGAN) 25 MG tablet Take 25 mg by mouth every 6 (six) hours as needed for nausea.    Yes [provider]  torsemide (DEMADEX) 10 MG tablet Take 20 mg by mouth daily.    Yes [provider]  Cyanocobalamin 1500 MCG TBDP Take 3 tablets by mouth daily.    [provider]  tiotropium (SPIRIVA) 18 MCG inhalation capsule Place 18 mcg into inhaler and inhale daily.    [provider]  tiZANidine (ZANAFLEX) 4 MG capsule Take 4 mg by mouth 3 (three) times daily as needed for  muscle spasms.    [provider]  traZODone (DESYREL) 50 MG tablet Take 50 mg by mouth at bedtime. 08/10/16   [provider]     VITAL SIGNS:  Blood pressure 131/77, pulse (!) 117, temperature 98.2 F (36.8 C), temperature source Oral, resp. rate 14, height 5\' 6"  (1.676 m), weight 103 kg, SpO2 96 %.  PHYSICAL EXAMINATION:  Physical Exam  GENERAL:  77 y.o.-year-old patient lying in the bed with conversational dyspnea.  Morbidly obese EYES: Pupils equal, round, reactive to light and accommodation. No scleral icterus. Extraocular muscles intact.  HEENT: Head atraumatic, normocephalic. Oropharynx and nasopharynx clear. No oropharyngeal erythema, moist oral mucosa  NECK:  Supple, no jugular venous distention. No thyroid enlargement, no tenderness.  LUNGS: Increased work of breathing.  Bilateral wheezing and crackles.  Decreased air entry. CARDIOVASCULAR: S1, S2 normal. No  murmurs, rubs, or gallops.  ABDOMEN: Soft, nontender, nondistended. Bowel sounds present. No organomegaly or mass.  EXTREMITIES: No  cyanosis, or clubbing. + 2 pedal & radial pulses b/l.  Bilateral lower extremity edema NEUROLOGIC: Cranial nerves II through XII are intact. No focal Motor or sensory deficits appreciated b/l PSYCHIATRIC: The patient is alert and oriented x 3.  Anxious SKIN: Small area in the central abdomen which has cellulitis of 2 x 2 cm  LABORATORY PANEL:   CBC Recent Labs  Lab 01/04/18 1144  WBC 11.0*  HGB 9.1*  HCT 31.1*  PLT 312   ------------------------------------------------------------------------------------------------------------------  Chemistries  Recent Labs  Lab 01/04/18 1144  NA 140  K 4.1  CL 99  CO2 28  GLUCOSE 194*  BUN 20  CREATININE 1.98*  CALCIUM 7.8*   ------------------------------------------------------------------------------------------------------------------  Cardiac Enzymes No results for input(s): TROPONINI in the last 168  hours. ------------------------------------------------------------------------------------------------------------------  RADIOLOGY:  Dg Chest Portable 1 View  Result Date: 01/04/2018 CLINICAL DATA:  Shortness of breath.  Wheezing. EXAM: PORTABLE CHEST 1 VIEW COMPARISON:  01/06/2017. FINDINGS: Cardiomegaly. No pulmonary venous congestion. Bibasilar mild interstitial prominence. Small bilateral pleural effusions. CHF cannot be excluded. Mild bibasilar atelectasis. No pneumothorax. IMPRESSION: 1. Cardiomegaly with mild bibasilar interstitial prominence. Small bilateral pleural effusions cannot be excluded. Mild CHF cannot be excluded. 2.  Bibasilar atelectasis. Electronically Signed   By: [place]   On: 01/04/2018 12:30   IMPRESSION AND PLAN:   *Acute on chronic hypoxic respiratory failure secondary to COPD exacerbation but also contributed by some CHF exacerbation. Wean oxygen back to 3 L as tolerated  *Acute COPD exacerbation -IV steroids, Antibiotics.  Doxycycline ordered which would also cover her abdominal wall cellulitis - Scheduled Nebulizers - Inhalers -Wean O2 as tolerated - Consult pulmonary if no improvement  *Acute on chronic diastolic congestive heart failure - IV Lasix - Input and Output - Counseled to limit fluids and Salt - Monitor Bun/Cr and Potassium - Echo-reviewed -Cardiology follow up after discharge  *Acute kidney injury over CKD stage III.  Monitor while being diuresed.  *Abdominal wall cellulitis started out as folliculitis.  On doxycycline  *Diabetes mellitus.  Would likely be uncontrolled due to IV steroids.  Sliding scale insulin ordered.  *DVT prophylaxis with Lovenox  All the records are reviewed and case discussed with ED provider. Management plans discussed with the patient, family and they are in agreement.  CODE STATUS: Full code  TOTAL TIME TAKING CARE OF THIS PATIENT: 40 minutes.   Leia Alf Antonetta Clanton M.D on 01/04/2018 at 1:49  PM  Between 7am to 6pm - Pager - [phone]  After 6pm go to [email or web address] - password [email or web address] [facility]  Office  [phone]  CC: Primary care physician; Denton Lank, MD  Note: This dictation was prepared with Dragon dictation along with smaller phrase technology. Any transcriptional errors that result from this process are unintentional.

## 2018-01-04 NOTE — ED Notes (Signed)
Pt given meal tray and drink. Pt also given icee.

## 2018-01-04 NOTE — ED Provider Notes (Signed)
[facility] Emergency Department Provider Note  ____________________________________________  Time seen: Approximately 2:06 PM  I have reviewed the triage vital signs and the nursing notes.   HISTORY  Chief Complaint Shortness of Breath    HPI Mckenzie Taylor is a 77 y.o. female with a history of polycystic kidney disease, COPD hypertension who complains of shortness of breath worsening since yesterday.  Constant, worse with movement and walking, no alleviating factors.  Tried her medications at home.  Denies chest pain.  Increased coughing, productive of thick sputum.  Denies fever or chills.  No syncope.  Prior to arrival EMS gave 3 nebulizer treatments and 125 mg Solu-Medrol IV.      Past Medical History:  Diagnosis Date  . Autosomal recessive polycystic kidneys   . Avascular necrosis of hip (HCC)    bilateral  . CAD (coronary artery disease)    mild  . CHF (congestive heart failure) ([place])   . Chronic kidney disease    cyst  . COPD (chronic obstructive pulmonary disease) ([place])   . Diabetes type 2, controlled ([place])   . GERD (gastroesophageal reflux disease)   . HTN (hypertension)   . Hypothyroidism    subclinical. low TSH, normal thyroid panel. biopsy 2010  . Vitamin B12 deficiency      Patient Active Problem List   Diagnosis Date Noted  . COPD exacerbation ([place]) 01/04/2018  . CHF (congestive heart failure) ([place]) 01/06/2017  . COPD (chronic obstructive pulmonary disease) ([place]) 11/25/2016  . Nausea and vomiting 10/29/2016  . Acute on chronic congestive heart failure ([place])   . Acute pulmonary edema (HCC)   . Acute respiratory failure with hypoxia ([place]) 10/25/2016  . COPD with acute exacerbation ([place]) 08/14/2016  . CAP (community acquired pneumonia) 08/14/2016  . Acute systolic CHF (congestive heart failure) ([place]) 08/14/2016  . Accelerated hypertension 08/14/2016  . GERD (gastroesophageal reflux disease) 08/14/2016  . Diabetes ([place])  08/14/2016  . Respiratory distress 05/05/2016  . Acute respiratory failure ([place]) 03/24/2016  . 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 TOBACCO ABUSE 12/17/2008  . Coronary atherosclerosis of native coronary artery 12/17/2008  . 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 CLAUDICATION, INTERMITTENT 12/17/2008     Past Surgical History:  Procedure Laterality Date  . CHOLECYSTECTOMY    . hip replacement     bilateral-secondary to avascular necrosis  . right eye lens replacement    . ULNAR NERVE REPAIR     bilateral  . VESICOVAGINAL FISTULA CLOSURE W/ TAH       Prior to Admission medications   Medication Sig Start Date End Date Taking? Authorizing Provider  albuterol (VENTOLIN HFA) 108 (90 BASE) MCG/ACT inhaler Inhale 2 puffs into the lungs every 4 (four) hours as needed.    Yes [provider]  amLODipine (NORVASC) 10 MG tablet Take 10 mg by mouth daily.     Yes [provider]  atorvastatin (LIPITOR) 40 MG tablet Take 40 mg by mouth every evening.    Yes [provider]  busPIRone (BUSPAR) 5 MG tablet Take 5 mg by mouth 2 (two) times daily.   Yes [provider]  Cholecalciferol (VITAMIN D3) 5000 units CAPS Take 1 capsule by mouth daily.   Yes [provider]  Cyanocobalamin 1500 MCG TBDP Take 3 tablets by mouth daily.   Yes [provider]  FLUoxetine (PROZAC) 40 MG capsule Take 40 mg by mouth every evening.    Yes [provider]  Fluticasone-Salmeterol (ADVAIR)  250-50 MCG/DOSE AEPB Inhale 1 puff into the lungs 2 (two) times daily.   Yes [provider]  gabapentin (NEURONTIN) 100 MG capsule Take 200 mg by mouth 3 (three) times daily.   Yes [provider]  ipratropium-albuterol (DUONEB) 0.5-2.5 (3) MG/3ML SOLN Take 3 mLs by nebulization 4 (four) times daily. 03/28/16  Yes Vaughan Basta, MD  LEVEMIR FLEXTOUCH 100 UNIT/ML Pen Inject 32 Units into the skin at bedtime.     Yes [provider]  metolazone (ZAROXOLYN) 2.5 MG tablet Take 2.5 mg by mouth daily.   Yes [provider]  metoprolol succinate (TOPROL-XL) 25 MG 24 hr tablet Take 25 mg by mouth daily.   Yes [provider]  NOVOLOG FLEXPEN 100 UNIT/ML FlexPen Inject 8 Units into the skin 3 (three) times daily as needed for high blood sugar. Sliding scale if blood sugar is above 200 - max 35u daily 04/02/16  Yes [provider]  omeprazole (PRILOSEC) 20 MG capsule Take 20 mg by mouth daily.   Yes [provider]  oxyCODONE-acetaminophen (PERCOCET) 10-325 MG tablet Take 1 tablet by mouth 3 (three) times daily as needed for pain.  12/08/17  Yes [provider]  promethazine (PHENERGAN) 25 MG tablet Take 25 mg by mouth every 6 (six) hours as needed for nausea.    Yes [provider]  tiotropium (SPIRIVA) 18 MCG inhalation capsule Place 18 mcg into inhaler and inhale daily.   Yes [provider]  tiZANidine (ZANAFLEX) 4 MG capsule Take 4 mg by mouth 3 (three) times daily as needed for muscle spasms.   Yes [provider]  torsemide (DEMADEX) 10 MG tablet Take 20 mg by mouth daily.    Yes [provider]  traZODone (DESYREL) 50 MG tablet Take 50 mg by mouth at bedtime. 08/10/16  Yes [provider]  metoprolol tartrate (LOPRESSOR) 25 MG tablet Take 1 tablet (25 mg total) by mouth 2 (two) times daily. Patient not taking: Reported on 01/04/2018 08/19/16   Vaughan Basta, MD     Allergies Other   Family History  Problem Relation Age of Onset  . Cancer Father        lung  . COPD Mother   . Heart disease Mother   . Heart disease Maternal Uncle     Social History Social History   Tobacco Use  . Smoking status: Former Smoker    Packs/day: 1.00    Years: 50.00    Pack years: 50.00  . Smokeless tobacco: Never Used  . Tobacco comment: 1 ppd - 50 years   Substance Use Topics  . Alcohol use: No  . Drug  use: No    Review of Systems  Constitutional:   No fever or chills.  ENT:   No sore throat. No rhinorrhea. Cardiovascular:   No chest pain or syncope. Respiratory: Positive shortness of breath and productive cough. Gastrointestinal:   Negative for abdominal pain, vomiting and diarrhea.  Musculoskeletal:   Negative for focal pain or swelling All other systems reviewed and are negative except as documented above in ROS and HPI.  ____________________________________________   PHYSICAL EXAM:  VITAL SIGNS: ED Triage Vitals  Enc Vitals Group     BP 01/04/18 1135 134/82     Pulse Rate 01/04/18 1135 (!) 120     Resp 01/04/18 1135 (!) 26     Temp 01/04/18 1135 98.2 F (36.8 C)     Temp Source 01/04/18 1135 Oral  SpO2 01/04/18 1134 96 %     Weight 01/04/18 1140 227 lb (103 kg)     Height 01/04/18 1140 5\' 6"  (1.676 m)     Head Circumference --      Peak Flow --      Pain Score 01/04/18 1140 10     Pain Loc --      Pain Edu? --      Excl. in [place]? --     Vital signs reviewed, nursing assessments reviewed.   Constitutional:   Alert and oriented.  Ill-appearing Eyes:   Conjunctivae are normal. EOMI. PERRL. ENT      Head:   Normocephalic and atraumatic.      Nose:   No congestion/rhinnorhea.       Mouth/Throat:   MMM, no pharyngeal erythema. No peritonsillar mass.       Neck:   No meningismus. Full ROM. Hematological/Lymphatic/Immunilogical:   No cervical lymphadenopathy. Cardiovascular:   Tachycardia heart rate 115. Symmetric bilateral radial and DP pulses.  No murmurs. Cap refill less than 2 seconds. Respiratory:   Increased work of breathing, tachypnea.  Suprasternal retractions.  Diffuse expiratory wheezing.  No focal crackles. Gastrointestinal:   Soft and nontender. Non distended. There is no CVA tenderness.  No rebound, rigidity, or guarding.  Musculoskeletal:   Normal range of motion in all extremities. No joint effusions.  No lower extremity tenderness.  No  edema. Neurologic:   Normal speech and language.  Motor grossly intact. No acute focal neurologic deficits are appreciated.  Skin:    Skin is warm, dry and intact. No rash noted.  No petechiae, purpura, or bullae.  ____________________________________________    LABS (pertinent positives/negatives) (all labs ordered are listed, but only abnormal results are displayed) Labs Reviewed  BASIC METABOLIC PANEL - Abnormal; Notable for the following components:      Result Value   Glucose, Bld 194 (*)    Creatinine, Ser 1.98 (*)    Calcium 7.8 (*)    GFR calc non Af Amer 24 (*)    GFR calc Af Amer 27 (*)    All other components within normal limits  CBC WITH DIFFERENTIAL/PLATELET - Abnormal; Notable for the following components:   WBC 11.0 (*)    RBC 3.37 (*)    Hemoglobin 9.1 (*)    HCT 31.1 (*)    MCHC 29.3 (*)    RDW 16.3 (*)    Abs Immature Granulocytes 0.17 (*)    All other components within normal limits  BRAIN NATRIURETIC PEPTIDE  HEMOGLOBIN A1C   ____________________________________________   EKG    ____________________________________________    RADIOLOGY  Dg Chest Portable 1 View  Result Date: 01/04/2018 CLINICAL DATA:  Shortness of breath.  Wheezing. EXAM: PORTABLE CHEST 1 VIEW COMPARISON:  01/06/2017. FINDINGS: Cardiomegaly. No pulmonary venous congestion. Bibasilar mild interstitial prominence. Small bilateral pleural effusions. CHF cannot be excluded. Mild bibasilar atelectasis. No pneumothorax. IMPRESSION: 1. Cardiomegaly with mild bibasilar interstitial prominence. Small bilateral pleural effusions cannot be excluded. Mild CHF cannot be excluded. 2.  Bibasilar atelectasis. Electronically Signed   By: Marcello Moores  Register   On: 01/04/2018 12:30    ____________________________________________   PROCEDURES Procedures  ____________________________________________    CLINICAL IMPRESSION / ASSESSMENT AND PLAN / ED COURSE  Pertinent labs & imaging results  that were available during my care of the patient were reviewed by me and considered in my medical decision making (see chart for details).    Patient presents with shortness of breath  and cough, consistent with COPD exacerbation based on exam.  Low suspicion for ACS PE dissection AAA pneumothorax.  Does not appear to have pneumonia, not septic, but due to mortality benefit and COPD exacerbations, I am giving her subtraction and azithromycin.  Case discussed with the hospitalist for further management.  IV magnesium for further bronchial relaxation..      ____________________________________________   FINAL CLINICAL IMPRESSION(S) / ED DIAGNOSES    Final diagnoses:  COPD exacerbation [facility])     ED Discharge Orders    None      Portions of this note were generated with dragon dictation software. Dictation errors may occur despite best attempts at proofreading.    Carrie Mew, MD 01/04/18 1407

## 2018-01-04 NOTE — ED Notes (Signed)
Called dietary and they stated they would send up something for patient for lunch. Informed them that the order is in at this time.

## 2018-01-04 NOTE — ED Notes (Signed)
Pt in bathroom at this time. Pt states her stomach is upset. Will transfer pt to floor once she is out of bathroom.

## 2018-01-04 NOTE — Progress Notes (Signed)
Advance care planning  Purpose of Encounter COPD, CHF, Code status discussion  Parties in Attendance Patient and grand daughter  Patients Decisional capacity Alert and oriented.  Able to make medical decisions  Discussed with patient regarding her COPD and CHF exacerbation.  Treatment plan and prognosis discussed.  All questions answered  Discussed at length regarding CODE STATUS.  Patient was made DO NOT RESUSCITATE during prior admission by palliative care service.  Patient tells me that she had decided regarding DO NOT RESUSCITATE status and had paperwork filled.  After this her family was against this and made her full code.  At this time she wants to be full code and have family decide on further course if deterioration.  Patient has documented healthcare power of attorney her son Vincente Poli  Full code  Time spent - 17 minutes

## 2018-01-05 LAB — CBC
HCT: 31.2 % — ABNORMAL LOW (ref 36.0–46.0)
Hemoglobin: 9 g/dL — ABNORMAL LOW (ref 12.0–15.0)
MCH: 26.9 pg (ref 26.0–34.0)
MCHC: 28.8 g/dL — ABNORMAL LOW (ref 30.0–36.0)
MCV: 93.4 fL (ref 80.0–100.0)
Platelets: 341 10*3/uL (ref 150–400)
RBC: 3.34 MIL/uL — ABNORMAL LOW (ref 3.87–5.11)
RDW: 16 % — ABNORMAL HIGH (ref 11.5–15.5)
WBC: 12.3 10*3/uL — ABNORMAL HIGH (ref 4.0–10.5)
nRBC: 0.2 % (ref 0.0–0.2)

## 2018-01-05 LAB — BASIC METABOLIC PANEL
Anion gap: 8 (ref 5–15)
BUN: 27 mg/dL — ABNORMAL HIGH (ref 8–23)
CO2: 32 mmol/L (ref 22–32)
Calcium: 8.1 mg/dL — ABNORMAL LOW (ref 8.9–10.3)
Chloride: 98 mmol/L (ref 98–111)
Creatinine, Ser: 2.15 mg/dL — ABNORMAL HIGH (ref 0.44–1.00)
GFR calc Af Amer: 25 mL/min — ABNORMAL LOW (ref >60)
GFR calc non Af Amer: 21 mL/min — ABNORMAL LOW (ref >60)
Glucose, Bld: 280 mg/dL — ABNORMAL HIGH (ref 70–99)
Potassium: 5.5 mmol/L — ABNORMAL HIGH (ref 3.5–5.1)
Sodium: 138 mmol/L (ref 135–145)

## 2018-01-05 LAB — GLUCOSE, CAPILLARY
Glucose-Capillary: 200 mg/dL — ABNORMAL HIGH (ref 70–99)
Glucose-Capillary: 165 mg/dL — ABNORMAL HIGH (ref 70–99)
Glucose-Capillary: 136 mg/dL — ABNORMAL HIGH (ref 70–99)
Glucose-Capillary: 193 mg/dL — ABNORMAL HIGH (ref 70–99)

## 2018-01-05 LAB — HEMOGLOBIN A1C
Hgb A1c MFr Bld: 8.7 % — ABNORMAL HIGH (ref 4.8–5.6)
Mean Plasma Glucose: 202.99 mg/dL

## 2018-01-05 MED ORDER — HEPARIN SODIUM (PORCINE) 5000 UNIT/ML IJ SOLN
5000.0000 [IU] | Freq: Three times a day (TID) | INTRAMUSCULAR | Status: DC
  Administered 2018-01-05 – 2018-01-10 (×14): 5000 [IU] via SUBCUTANEOUS
  Filled 2018-01-05 (×14): qty 1

## 2018-01-05 MED ORDER — INSULIN ASPART 100 UNIT/ML ~~LOC~~ SOLN
4.0000 [IU] | Freq: Three times a day (TID) | SUBCUTANEOUS | Status: DC
  Administered 2018-01-05 – 2018-01-10 (×14): 4 [IU] via SUBCUTANEOUS
  Filled 2018-01-05 (×14): qty 1

## 2018-01-05 MED ORDER — IPRATROPIUM-ALBUTEROL 0.5-2.5 (3) MG/3ML IN SOLN
3.0000 mL | Freq: Four times a day (QID) | RESPIRATORY_TRACT | Status: DC
  Administered 2018-01-05 – 2018-01-10 (×19): 3 mL via RESPIRATORY_TRACT
  Filled 2018-01-05 (×19): qty 3

## 2018-01-05 MED ORDER — INSULIN DETEMIR 100 UNIT/ML ~~LOC~~ SOLN
38.0000 [IU] | Freq: Every day | SUBCUTANEOUS | Status: DC
  Administered 2018-01-05 – 2018-01-09 (×5): 38 [IU] via SUBCUTANEOUS
  Filled 2018-01-05 (×6): qty 0.38

## 2018-01-05 NOTE — Progress Notes (Signed)
Patient's SAT was 83 upon arrival, resting comfortably in bed on 2L [place]. O2 increased to 5L, SAT at 91%. RN made aware of O2 adjustment and patient's SAT level.

## 2018-01-05 NOTE — Progress Notes (Addendum)
Inpatient Diabetes Program Recommendations  AACE/ADA: New Consensus Statement on Inpatient Glycemic Control (2015)  Target Ranges:  Prepandial:   less than 140 mg/dL      Peak postprandial:   less than 180 mg/dL (1-2 hours)      Critically ill patients:  140 - 180 mg/dL   Results for NAIRA, STANDIFORD (MRN [id number]) as of 01/05/2018 07:39  Ref. Range 01/04/2018 18:37 01/04/2018 20:51  Glucose-Capillary Latest Ref Range: 70 - 99 mg/dL 353 (H)  20 units NOVOLOG  374 (H)  5 units NOVOLOG +  32 units LEVEMIR    Results for SHANORA, CHRISTENSEN (MRN [id number]) as of 01/05/2018 07:39  Ref. Range 01/05/2018 04:19  Glucose Latest Ref Range: 70 - 99 mg/dL 280 (H)    Admit with: SOB/ COPD  History: DM, CHF, COPD  Home DM Meds: Levemir 32 units QHS       Novolog 8 units TID   Current Orders: Levemir 32 units QHS      Novolog Resistant Correction Scale/ SSI (0-20 units) TID AC + HS     MD- Please consider the following in-hospital insulin adjustments:  1. Increase Levemir to 38 units QHS (20% increase)  2. Start Novolog Meal Coverage: Novolog 4 units TID with meals (50% home dose)  (Please add the following Hold Parameters: Hold if pt eats <50% of meal, Hold if pt NPO)      --Will follow patient during hospitalization--  Wyn Quaker RN, MSN, CDE Diabetes Coordinator Inpatient Glycemic Control Team Team Pager: [phone] (8a-5p)

## 2018-01-05 NOTE — Progress Notes (Signed)
[place] at [place] NAME: Mckenzie Taylor    MR#:  [id number]  DATE OF BIRTH:  28-Jun-1940  SUBJECTIVE:  CHIEF COMPLAINT:   Chief Complaint  Patient presents with  . Shortness of Breath  Still quite SOB. sleepy REVIEW OF SYSTEMS:  Review of Systems  Constitutional: Negative for chills, fever and weight loss.  HENT: Negative for nosebleeds and sore throat.   Eyes: Negative for blurred vision.  Respiratory: Positive for cough and shortness of breath. Negative for wheezing.   Cardiovascular: Negative for chest pain, orthopnea, leg swelling and PND.  Gastrointestinal: Negative for abdominal pain, constipation, diarrhea, heartburn, nausea and vomiting.  Genitourinary: Negative for dysuria and urgency.  Musculoskeletal: Negative for back pain.  Skin: Negative for rash.  Neurological: Negative for dizziness, speech change, focal weakness and headaches.  Endo/Heme/Allergies: Does not bruise/bleed easily.  Psychiatric/Behavioral: Negative for depression.   DRUG ALLERGIES:   Allergies  Allergen Reactions  . Other Rash    Pt reports allergy to metals.   VITALS:  Blood pressure (!) 108/59, pulse 94, temperature (!) 97.5 F (36.4 C), temperature source Oral, resp. rate 18, height 5\' 6"  (1.676 m), weight 103.6 kg, SpO2 97 %. PHYSICAL EXAMINATION:  Physical Exam  Constitutional: She is oriented to person, place, and time.  HENT:  Head: Normocephalic and atraumatic.  Eyes: Pupils are equal, round, and reactive to light. Conjunctivae and EOM are normal.  Neck: Normal range of motion. Neck supple. No tracheal deviation present. No thyromegaly present.  Cardiovascular: Normal rate, regular rhythm and normal heart sounds.  Pulmonary/Chest: Effort normal and breath sounds normal. No respiratory distress. She has no wheezes. She exhibits no tenderness.  Abdominal: Soft. Bowel sounds are normal. She exhibits no distension. There is no tenderness.    Musculoskeletal: Normal range of motion.  Neurological: She is alert and oriented to person, place, and time. No cranial nerve deficit.  Skin: Skin is warm and dry. No rash noted.   LABORATORY PANEL:  Female CBC Recent Labs  Lab 01/05/18 0419  WBC 12.3*  HGB 9.0*  HCT 31.2*  PLT 341   ------------------------------------------------------------------------------------------------------------------ Chemistries  Recent Labs  Lab 01/05/18 0419  NA 138  K 5.5*  CL 98  CO2 32  GLUCOSE 280*  BUN 27*  CREATININE 2.15*  CALCIUM 8.1*   RADIOLOGY:  Dg Chest Portable 1 View  Result Date: 01/04/2018 CLINICAL DATA:  Shortness of breath.  Wheezing. EXAM: PORTABLE CHEST 1 VIEW COMPARISON:  01/06/2017. FINDINGS: Cardiomegaly. No pulmonary venous congestion. Bibasilar mild interstitial prominence. Small bilateral pleural effusions. CHF cannot be excluded. Mild bibasilar atelectasis. No pneumothorax. IMPRESSION: 1. Cardiomegaly with mild bibasilar interstitial prominence. Small bilateral pleural effusions cannot be excluded. Mild CHF cannot be excluded. 2.  Bibasilar atelectasis. Electronically Signed   By: Marcello Moores  Register   On: 01/04/2018 12:30   ASSESSMENT AND PLAN:  77 y.o. female with a known history of diastolic CHF, COPD, chronic respiratory failure, hypertension, diabetes mellitus, morbid obesity admitted for 2 weeks of progressively worsening shortness of breath, wheezing, orthopnea, lower extremity edema and 7 pound weight gain  *Acute on chronic hypoxic respiratory failure secondary to COPD/CHF exacerbation  Wean oxygen as tolerated  *Acute COPD exacerbation -continue IV steroids, Antibiotics.  Doxycycline ordered which would also cover her abdominal wall cellulitis - Scheduled Nebulizers - Inhalers -Wean O2 as tolerated - Consult pulmonary if no improvement  *Acute on chronic diastolic congestive heart failure - IV Lasix - Input and  Output - Counseled to limit fluids  and Salt - Monitor Bun/Cr and Potassium - Echo-reviewed -Cardiology follow up after discharge  *Acute kidney injury over CKD stage III - may be cardio renal syndrome  *Abdominal wall cellulitis started out as folliculitis: continue doxycycline  *Diabetes mellitus.  Would likely be uncontrolled due to IV steroids.  Sliding scale insulin ordered.  *DVT prophylaxis with Lovenox     All the records are reviewed and case discussed with Care Management/Social Worker. Management plans discussed with the patient, nursing and they are in agreement.  CODE STATUS: Full Code  TOTAL TIME TAKING CARE OF THIS PATIENT: 35 minutes.   More than 50% of the time was spent in counseling/coordination of care: YES  POSSIBLE D/C IN 1-2 DAYS, DEPENDING ON CLINICAL CONDITION.   Max Sane M.D on 01/05/2018 at 9:48 AM  Between 7am to 6pm - Pager - [phone]  After 6pm go to [email or web address] - Proofreader  [facility]  Office  [phone]  CC: Primary care physician; Denton Lank, MD  Note: This dictation was prepared with Dragon dictation along with smaller phrase technology. Any transcriptional errors that result from this process are unintentional.

## 2018-01-05 NOTE — Progress Notes (Signed)
IS and Flutter education complete. Pt understands technique and reason for both. Pt inspire 865ml+-. Pt independent with use.

## 2018-01-06 LAB — GLUCOSE, CAPILLARY
Glucose-Capillary: 129 mg/dL — ABNORMAL HIGH (ref 70–99)
Glucose-Capillary: 242 mg/dL — ABNORMAL HIGH (ref 70–99)
Glucose-Capillary: 354 mg/dL — ABNORMAL HIGH (ref 70–99)
Glucose-Capillary: 420 mg/dL — ABNORMAL HIGH (ref 70–99)

## 2018-01-06 MED ORDER — METHYLPREDNISOLONE SODIUM SUCC 125 MG IJ SOLR
60.0000 mg | Freq: Four times a day (QID) | INTRAMUSCULAR | Status: DC
  Administered 2018-01-06 – 2018-01-07 (×5): 60 mg via INTRAVENOUS
  Filled 2018-01-06 (×5): qty 2

## 2018-01-06 MED ORDER — ALUM & MAG HYDROXIDE-SIMETH 200-200-20 MG/5ML PO SUSP
30.0000 mL | Freq: Four times a day (QID) | ORAL | Status: DC | PRN
  Administered 2018-01-06: 13:00:00 30 mL via ORAL
  Filled 2018-01-06: qty 30

## 2018-01-06 MED ORDER — INSULIN ASPART 100 UNIT/ML ~~LOC~~ SOLN
7.0000 [IU] | Freq: Once | SUBCUTANEOUS | Status: AC
  Administered 2018-01-06: 7 [IU] via SUBCUTANEOUS
  Filled 2018-01-06: qty 1

## 2018-01-06 NOTE — Progress Notes (Signed)
[facility]  ROUNDING NOTE   Subjective:  Patient will mount was from prior admission. She has known polycystic kidney disease. Her baseline creatinine appears to be 1.34 from 01/07/17. It appears that she's express acute renal failure now with a creatinine up to 2.15.   Objective:  Vital signs in last 24 hours:  Temp:  [97.5 F (36.4 C)-97.9 F (36.6 C)] 97.9 F (36.6 C) (10/25 1233) Pulse Rate:  [83-96] 96 (10/25 1233) Resp:  [17-20] 20 (10/25 1233) BP: (104-143)/(52-73) 143/52 (10/25 1233) SpO2:  [90 %-96 %] 93 % (10/25 1534) Weight:  [106.5 kg] 106.5 kg (10/25 0357)  Weight change: 3.493 kg Filed Weights   01/04/18 1506 01/05/18 0457 01/06/18 0357  Weight: 103.2 kg 103.6 kg 106.5 kg    Intake/Output: No intake/output data recorded.   Intake/Output this shift:  No intake/output data recorded.  Physical Exam: General: No acute distress  Head: Normocephalic, atraumatic. Moist oral mucosal membranes  Eyes: Anicteric  Neck: Supple, trachea midline  Lungs:  Clear to auscultation, normal effort  Heart: S1S2 no rubs  Abdomen:  Soft, nontender, bowel sounds present  Extremities: 2+ peripheral edema.  Neurologic: Awake, alert, following commands  Skin: No lesions       Basic Metabolic Panel: Recent Labs  Lab 01/04/18 1144 01/05/18 0419  NA 140 138  K 4.1 5.5*  CL 99 98  CO2 28 32  GLUCOSE 194* 280*  BUN 20 27*  CREATININE 1.98* 2.15*  CALCIUM 7.8* 8.1*    Liver Function Tests: No results for input(s): AST, ALT, ALKPHOS, BILITOT, PROT, ALBUMIN in the last 168 hours. No results for input(s): LIPASE, AMYLASE in the last 168 hours. No results for input(s): AMMONIA in the last 168 hours.  CBC: Recent Labs  Lab 01/04/18 1144 01/05/18 0419  WBC 11.0* 12.3*  NEUTROABS 7.2  --   HGB 9.1* 9.0*  HCT 31.1* 31.2*  MCV 92.3 93.4  PLT 312 341    Cardiac Enzymes: No results for input(s): CKTOTAL, CKMB, CKMBINDEX, TROPONINI in the last 168  hours.  BNP: Invalid input(s): POCBNP  CBG: Recent Labs  Lab 01/05/18 1132 01/05/18 1633 01/05/18 2106 01/06/18 0739 01/06/18 1138  GLUCAP 165* 136* 193* 129* 54*    Microbiology: Results for orders placed or performed during the hospital encounter of 10/25/16  MRSA PCR Screening     Status: None   Collection Time: 10/25/16  5:31 PM  Result Value Ref Range Status   MRSA by PCR NEGATIVE NEGATIVE Final    Comment:        The GeneXpert MRSA Assay (FDA approved for NASAL specimens only), is one component of a comprehensive MRSA colonization surveillance program. It is not intended to diagnose MRSA infection nor to guide or monitor treatment for MRSA infections.     Coagulation Studies: No results for input(s): LABPROT, INR in the last 72 hours.  Urinalysis: No results for input(s): COLORURINE, LABSPEC, PHURINE, GLUCOSEU, HGBUR, BILIRUBINUR, KETONESUR, PROTEINUR, UROBILINOGEN, NITRITE, LEUKOCYTESUR in the last 72 hours.  Invalid input(s): APPERANCEUR    Imaging: No results found.   Medications:    . amLODipine  10 mg Oral Daily  . atorvastatin  40 mg Oral QPM  . busPIRone  5 mg Oral BID  . doxycycline  100 mg Oral Q12H  . FLUoxetine  40 mg Oral QPM  . gabapentin  200 mg Oral TID  . heparin injection (subcutaneous)  5,000 Units Subcutaneous Q8H  . insulin aspart  0-20 Units Subcutaneous TID  WC  . insulin aspart  0-5 Units Subcutaneous QHS  . insulin aspart  4 Units Subcutaneous TID WC  . insulin detemir  38 Units Subcutaneous QHS  . ipratropium-albuterol  3 mL Nebulization QID  . mouth rinse  15 mL Mouth Rinse BID  . methylPREDNISolone (SOLU-MEDROL) injection  60 mg Intravenous Q6H  . metoprolol succinate  25 mg Oral Daily  . mometasone-formoterol  2 puff Inhalation BID  . pantoprazole  40 mg Oral Daily  . sodium chloride flush  3 mL Intravenous Q12H  . tiotropium  18 mcg Inhalation Daily  . traZODone  50 mg Oral QHS   acetaminophen **OR**  acetaminophen, albuterol, alum & mag hydroxide-simeth, ondansetron **OR** ondansetron (ZOFRAN) IV, oxyCODONE-acetaminophen **AND** oxyCODONE, polyethylene glycol, promethazine, tiZANidine  Assessment/ Plan:  77 y.o. female with a PMHx of Polycystic kidney disease, avascular necrosis of the hips, coronary artery disease, chronic systolic heart failure ejection fraction 40-45%, chronic kidney disease stage III, GERD, hypertension, hyperthyroidism, vitamin B12 deficiency, admitted with increasing shortness of breath in the setting of heart failure.   1.  Acute renal failure/chronic kidney disease stage III/polycystic kidney disease.  The patient's baseline creatinine appears to be 1.36.  Renal function worse now with a creatinine up to 2.15 and EGFR down to 21.  We will proceed with renal ultrasound to reevaluateher history of polycystic kidney disease.  Patient currently off of diuretics.  Continue to monitor respiratory status closely.  2.  Anemia chronic kidney disease.  Hemoglobin down to 9.0.  She also appears have a iron deficiency. Consider administering intravenous iron but deferred to hospitalist.  3.  Acute on chronic systolic heart failure exacerbation.  Currently off of diuretics given worsening renal function.  LOS: 2 Lauralynn Loeb 10/25/20193:51 PM

## 2018-01-06 NOTE — Care Management Important Message (Signed)
Important Message  Patient Details  Name: Mckenzie Taylor MRN: [id number] Date of Birth: 1940/05/10   Medicare Important Message Given:  Yes    Juliann Pulse A Offie Pickron 01/06/2018, 11:03 AM

## 2018-01-06 NOTE — Care Management Note (Signed)
Case Management Note  Patient Details  Name: Mckenzie Taylor MRN: [id number] Date of Birth: 10-05-1940  Subjective/Objective:                 Presents with increasing shortness of breath. Admitted with exac of copd and heart failure Chronic home oxygen with [facility].  Denies issues with  getting to appointments or obtaining her medications.  Has access to scales for daily but does not always weigh. Nephrology is following for worsening creatinine. patient in the past has declined any home health services and did not keep appointments with the heart failure clinic in 2018.  She is high risk for unplanned readmission. Verbal conversation increasing respiratory difficulty at present   Action/Plan:    Expected Discharge Date:                  Expected Discharge Plan:     In-House Referral:     Discharge planning Services     Post Acute Care Choice:    Choice offered to:     DME Arranged:    DME Agency:     HH Arranged:    HH Agency:     Status of Service:     If discussed at [employer] of Stay Meetings, dates discussed:    Additional Comments:  Katrina Stack, RN 01/06/2018, 5:43 PM

## 2018-01-06 NOTE — Progress Notes (Signed)
[place] at [place] NAME: Brithney Bensen    MR#:  [id number]  DATE OF BIRTH:  1940-05-23  SUBJECTIVE:  CHIEF COMPLAINT:   Chief Complaint  Patient presents with  . Shortness of Breath  still having SOB, cough + REVIEW OF SYSTEMS:  Review of Systems  Constitutional: Negative for chills, fever and weight loss.  HENT: Negative for nosebleeds and sore throat.   Eyes: Negative for blurred vision.  Respiratory: Positive for cough and shortness of breath. Negative for wheezing.   Cardiovascular: Negative for chest pain, orthopnea, leg swelling and PND.  Gastrointestinal: Negative for abdominal pain, constipation, diarrhea, heartburn, nausea and vomiting.  Genitourinary: Negative for dysuria and urgency.  Musculoskeletal: Negative for back pain.  Skin: Negative for rash.  Neurological: Negative for dizziness, speech change, focal weakness and headaches.  Endo/Heme/Allergies: Does not bruise/bleed easily.  Psychiatric/Behavioral: Negative for depression.   DRUG ALLERGIES:   Allergies  Allergen Reactions  . Other Rash    Pt reports allergy to metals.   VITALS:  Blood pressure 104/71, pulse 87, temperature 97.8 F (36.6 C), temperature source Oral, resp. rate 17, height 5\' 6"  (1.676 m), weight 106.5 kg, SpO2 95 %. PHYSICAL EXAMINATION:  Physical Exam  Constitutional: She is oriented to person, place, and time.  HENT:  Head: Normocephalic and atraumatic.  Eyes: Pupils are equal, round, and reactive to light. Conjunctivae and EOM are normal.  Neck: Normal range of motion. Neck supple. No tracheal deviation present. No thyromegaly present.  Cardiovascular: Normal rate, regular rhythm and normal heart sounds.  Pulmonary/Chest: Effort normal and breath sounds normal. No respiratory distress. She has no wheezes. She exhibits no tenderness.  Abdominal: Soft. Bowel sounds are normal. She exhibits no distension. There is no tenderness.    Musculoskeletal: Normal range of motion.  Neurological: She is alert and oriented to person, place, and time. No cranial nerve deficit.  Skin: Skin is warm and dry. No rash noted.   LABORATORY PANEL:  Female CBC Recent Labs  Lab 01/05/18 0419  WBC 12.3*  HGB 9.0*  HCT 31.2*  PLT 341   ------------------------------------------------------------------------------------------------------------------ Chemistries  Recent Labs  Lab 01/05/18 0419  NA 138  K 5.5*  CL 98  CO2 32  GLUCOSE 280*  BUN 27*  CREATININE 2.15*  CALCIUM 8.1*   RADIOLOGY:  No results found. ASSESSMENT AND PLAN:  77 y.o. female with a known history of diastolic CHF, COPD, chronic respiratory failure, hypertension, diabetes mellitus, morbid obesity admitted for 2 weeks of progressively worsening shortness of breath, wheezing, orthopnea, lower extremity edema and 7 pound weight gain  *Acute on chronic hypoxic respiratory failure secondary to COPD/CHF exacerbation  Wean oxygen as tolerated  *Acute COPD exacerbation -start IV steroids (solu-medrol 60 mg IV q 6), continue Doxycycline which would also cover her abdominal wall cellulitis - Scheduled Nebulizers - Inhalers -Wean O2 as tolerated - Consult pulmonary if no improvement  *Acute on chronic diastolic congestive heart failure - IV Lasix - Input and Output - Counseled to limit fluids and Salt - Monitor Bun/Cr and Potassium - Echo in 2018 showed EF 45-50% -Cardiology follow up after discharge  *Acute kidney injury over CKD stage III - may be cardio renal syndrome  *Abdominal wall cellulitis started out as folliculitis: continue doxycycline  *Diabetes mellitus.  Would likely be uncontrolled due to IV steroids.  Sliding scale insulin ordered.  *DVT prophylaxis with Lovenox     All the records are reviewed and  case discussed with Care Management/Social Worker. Management plans discussed with the patient, nursing and they are in  agreement.  CODE STATUS: Full Code  TOTAL TIME TAKING CARE OF THIS PATIENT: 35 minutes.   More than 50% of the time was spent in counseling/coordination of care: YES  POSSIBLE D/C IN 1-2 DAYS, DEPENDING ON CLINICAL CONDITION.   Max Sane M.D on 01/06/2018 at 9:37 AM  Between 7am to 6pm - Pager - [phone]  After 6pm go to [email or web address] - Proofreader  [facility]  Office  [phone]  CC: Primary care physician; Denton Lank, MD  Note: This dictation was prepared with Dragon dictation along with smaller phrase technology. Any transcriptional errors that result from this process are unintentional.

## 2018-01-07 ENCOUNTER — Inpatient Hospital Stay: Payer: Medicare Other

## 2018-01-07 LAB — COMPREHENSIVE METABOLIC PANEL
ALT: 19 U/L (ref 0–44)
AST: 18 U/L (ref 15–41)
Albumin: 3.3 g/dL — ABNORMAL LOW (ref 3.5–5.0)
Alkaline Phosphatase: 62 U/L (ref 38–126)
Anion gap: 10 (ref 5–15)
BUN: 45 mg/dL — ABNORMAL HIGH (ref 8–23)
CO2: 29 mmol/L (ref 22–32)
Calcium: 8.3 mg/dL — ABNORMAL LOW (ref 8.9–10.3)
Chloride: 98 mmol/L (ref 98–111)
Creatinine, Ser: 2.42 mg/dL — ABNORMAL HIGH (ref 0.44–1.00)
GFR calc Af Amer: 21 mL/min — ABNORMAL LOW (ref >60)
GFR calc non Af Amer: 18 mL/min — ABNORMAL LOW (ref >60)
Glucose, Bld: 368 mg/dL — ABNORMAL HIGH (ref 70–99)
Potassium: 5.5 mmol/L — ABNORMAL HIGH (ref 3.5–5.1)
Sodium: 137 mmol/L (ref 135–145)
Total Bilirubin: 0.5 mg/dL (ref 0.3–1.2)
Total Protein: 7.3 g/dL (ref 6.5–8.1)

## 2018-01-07 LAB — CBC WITH DIFFERENTIAL/PLATELET
Abs Immature Granulocytes: 0.38 10*3/uL — ABNORMAL HIGH (ref 0.00–0.07)
Basophils Absolute: 0 10*3/uL (ref 0.0–0.1)
Basophils Relative: 0 %
Eosinophils Absolute: 0 10*3/uL (ref 0.0–0.5)
Eosinophils Relative: 0 %
HCT: 29 % — ABNORMAL LOW (ref 36.0–46.0)
Hemoglobin: 8.5 g/dL — ABNORMAL LOW (ref 12.0–15.0)
Immature Granulocytes: 5 %
Lymphocytes Relative: 5 %
Lymphs Abs: 0.4 10*3/uL — ABNORMAL LOW (ref 0.7–4.0)
MCH: 27.2 pg (ref 26.0–34.0)
MCHC: 29.3 g/dL — ABNORMAL LOW (ref 30.0–36.0)
MCV: 92.9 fL (ref 80.0–100.0)
Monocytes Absolute: 0.1 10*3/uL (ref 0.1–1.0)
Monocytes Relative: 2 %
Neutro Abs: 7.2 10*3/uL (ref 1.7–7.7)
Neutrophils Relative %: 88 %
Platelets: 303 10*3/uL (ref 150–400)
RBC: 3.12 MIL/uL — ABNORMAL LOW (ref 3.87–5.11)
RDW: 16.3 % — ABNORMAL HIGH (ref 11.5–15.5)
WBC: 8.1 10*3/uL (ref 4.0–10.5)
nRBC: 0.2 % (ref 0.0–0.2)

## 2018-01-07 LAB — PHOSPHORUS: Phosphorus: 3.9 mg/dL (ref 2.5–4.6)

## 2018-01-07 LAB — GLUCOSE, CAPILLARY
Glucose-Capillary: 280 mg/dL — ABNORMAL HIGH (ref 70–99)
Glucose-Capillary: 379 mg/dL — ABNORMAL HIGH (ref 70–99)
Glucose-Capillary: 319 mg/dL — ABNORMAL HIGH (ref 70–99)
Glucose-Capillary: 217 mg/dL — ABNORMAL HIGH (ref 70–99)

## 2018-01-07 LAB — MAGNESIUM: Magnesium: 1.9 mg/dL (ref 1.7–2.4)

## 2018-01-07 LAB — TROPONIN I: Troponin I: <0.03 ng/mL (ref <0.03)

## 2018-01-07 MED ORDER — AMLODIPINE BESYLATE 5 MG PO TABS
5.0000 mg | ORAL_TABLET | Freq: Every day | ORAL | Status: DC
  Administered 2018-01-08 – 2018-01-10 (×3): 5 mg via ORAL
  Filled 2018-01-07 (×3): qty 1

## 2018-01-07 MED ORDER — SODIUM ZIRCONIUM CYCLOSILICATE 10 G PO PACK
10.0000 g | PACK | Freq: Three times a day (TID) | ORAL | Status: DC
  Administered 2018-01-07 – 2018-01-08 (×3): 10 g via ORAL
  Filled 2018-01-07 (×5): qty 1

## 2018-01-07 MED ORDER — IRON DEXTRAN 50 MG/ML IJ SOLN
100.0000 mg | Freq: Once | INTRAMUSCULAR | Status: AC
  Administered 2018-01-07: 12:00:00 100 mg via INTRAVENOUS
  Filled 2018-01-07 (×2): qty 2

## 2018-01-07 MED ORDER — METHYLPREDNISOLONE SODIUM SUCC 125 MG IJ SOLR: 60.0000 mg | Freq: Every day | INTRAMUSCULAR | Status: DC

## 2018-01-07 NOTE — Progress Notes (Signed)
[facility] at [place] NAME: Mckenzie Taylor    MR#:  [id number]  DATE OF BIRTH:  Apr 22, 1940  SUBJECTIVE:  CHIEF COMPLAINT:   Chief Complaint  Patient presents with  . Shortness of Breath  still having SOB, cough + Overall feeling better. REVIEW OF SYSTEMS:  Review of Systems  Constitutional: Negative for chills, fever and weight loss.  HENT: Negative for nosebleeds and sore throat.   Eyes: Negative for blurred vision.  Respiratory: Positive for cough and shortness of breath. Negative for wheezing.   Cardiovascular: Negative for chest pain, orthopnea, leg swelling and PND.  Gastrointestinal: Negative for abdominal pain, constipation, diarrhea, heartburn, nausea and vomiting.  Genitourinary: Negative for dysuria and urgency.  Musculoskeletal: Negative for back pain.  Skin: Negative for rash.  Neurological: Negative for dizziness, speech change, focal weakness and headaches.  Endo/Heme/Allergies: Does not bruise/bleed easily.  Psychiatric/Behavioral: Negative for depression.   DRUG ALLERGIES:   Allergies  Allergen Reactions  . Other Rash    Pt reports allergy to metals.   VITALS:  Blood pressure 129/64, pulse 95, temperature 97.7 F (36.5 C), temperature source Oral, resp. rate 20, height 5\' 6"  (1.676 m), weight 109.1 kg, SpO2 94 %. PHYSICAL EXAMINATION:  Physical Exam  Constitutional: She is oriented to person, place, and time.  HENT:  Head: Normocephalic and atraumatic.  Eyes: Pupils are equal, round, and reactive to light. Conjunctivae and EOM are normal.  Neck: Normal range of motion. Neck supple. No tracheal deviation present. No thyromegaly present.  Cardiovascular: Normal rate, regular rhythm and normal heart sounds.  Pulmonary/Chest: Effort normal and breath sounds normal. No respiratory distress. She has no wheezes. She exhibits no tenderness.  On chronic oxygen at home 3 to 4 L.  Abdominal: Soft. Bowel sounds are  normal. She exhibits no distension. There is no tenderness.  Musculoskeletal: Normal range of motion.  Neurological: She is alert and oriented to person, place, and time. No cranial nerve deficit.  Skin: Skin is warm and dry. No rash noted.   LABORATORY PANEL:  Female CBC Recent Labs  Lab 01/07/18 0224  WBC 8.1  HGB 8.5*  HCT 29.0*  PLT 303   ------------------------------------------------------------------------------------------------------------------ Chemistries  Recent Labs  Lab 01/07/18 0224  NA 137  K 5.5*  CL 98  CO2 29  GLUCOSE 368*  BUN 45*  CREATININE 2.42*  CALCIUM 8.3*  MG 1.9  AST 18  ALT 19  ALKPHOS 62  BILITOT 0.5   RADIOLOGY:  No results found. ASSESSMENT AND PLAN:  77 y.o. female with a known history of diastolic CHF, COPD, chronic respiratory failure, hypertension, diabetes mellitus, morbid obesity admitted for 2 weeks of progressively worsening shortness of breath, wheezing, orthopnea, lower extremity edema and 7 pound weight gain  *Acute on chronic hypoxic respiratory failure secondary to COPD/CHF exacerbation  Wean oxygen as tolerated  *Acute COPD exacerbation -start IV steroids (solu-medrol 60 mg IV q 6), continue Doxycycline which would also cover her abdominal wall cellulitis - Scheduled Nebulizers - Inhalers -Wean O2 as tolerated -Patient is improving, decrease the Solu-Medrol dose daily.  *Acute on chronic diastolic congestive heart failure - IV Lasix - Input and Output - Counseled to limit fluids and Salt - Monitor Bun/Cr and Potassium - Echo in 2018 showed EF 45-50% -Cardiology follow up after discharge -Renal function is worsening so diuretics are held, repeat chest x-ray today to evaluate improvement.  *Acute kidney injury over CKD stage III - may be cardio  renal syndrome Worsening noted due to diuretics use, nephrology help is appreciated. Hold diuretics for now.  *Hyperkalemia Due to acute renal failure. Nephrology to  help.  *Abdominal wall cellulitis started out as folliculitis: continue doxycycline.  *Diabetes mellitus.  Would likely be uncontrolled due to IV steroids.  Sliding scale insulin ordered.  *DVT prophylaxis with Lovenox   All the records are reviewed and case discussed with Care Management/Social Worker. Management plans discussed with the patient, nursing and they are in agreement.  CODE STATUS: Full Code  TOTAL TIME TAKING CARE OF THIS PATIENT: 35 minutes.   More than 50% of the time was spent in counseling/coordination of care: YES  POSSIBLE D/C IN 1-2 DAYS, DEPENDING ON CLINICAL CONDITION.   Mckenzie Taylor M.D on 01/07/2018 at 11:13 AM  Between 7am to 6pm - Pager - [phone]  After 6pm go to [email or web address] - Proofreader  [facility]  Office  [phone]  CC: Primary care physician; Denton Lank, MD  Note: This dictation was prepared with Dragon dictation along with smaller phrase technology. Any transcriptional errors that result from this process are unintentional.

## 2018-01-07 NOTE — Progress Notes (Addendum)
[facility]  ROUNDING NOTE   Subjective:  Function actually worse today. Creatinine up to 2.3. Still has some shortness of breath.    Objective:  Vital signs in last 24 hours:  Temp:  [97.7 F (36.5 C)-98.7 F (37.1 C)] 97.7 F (36.5 C) (10/26 0406) Pulse Rate:  [90-96] 95 (10/26 0842) Resp:  [19-20] 20 (10/26 0830) BP: (107-143)/(52-76) 129/64 (10/26 0842) SpO2:  [90 %-95 %] 94 % (10/26 0830) Weight:  [109.1 kg] 109.1 kg (10/26 0406)  Weight change: 2.641 kg Filed Weights   01/05/18 0457 01/06/18 0357 01/07/18 0406  Weight: 103.6 kg 106.5 kg 109.1 kg    Intake/Output: I/O last 3 completed shifts: In: 240 [P.O.:240] Out: -    Intake/Output this shift:  Total I/O In: 500 [P.O.:500] Out: -   Physical Exam: General: No acute distress  Head: Normocephalic, atraumatic. Moist oral mucosal membranes  Eyes: Anicteric  Neck: Supple, trachea midline  Lungs:  Basilar rales, normal effort  Heart: S1S2 no rubs  Abdomen:  Soft, nontender, bowel sounds present  Extremities: 1+ peripheral edema.  Neurologic: Awake, alert, following commands  Skin: No lesions       Basic Metabolic Panel: Recent Labs  Lab 01/04/18 1144 01/05/18 0419 01/07/18 0224  NA 140 138 137  K 4.1 5.5* 5.5*  CL 99 98 98  CO2 28 32 29  GLUCOSE 194* 280* 368*  BUN 20 27* 45*  CREATININE 1.98* 2.15* 2.42*  CALCIUM 7.8* 8.1* 8.3*  MG  --   --  1.9  PHOS  --   --  3.9    Liver Function Tests: Recent Labs  Lab 01/07/18 0224  AST 18  ALT 19  ALKPHOS 62  BILITOT 0.5  PROT 7.3  ALBUMIN 3.3*   No results for input(s): LIPASE, AMYLASE in the last 168 hours. No results for input(s): AMMONIA in the last 168 hours.  CBC: Recent Labs  Lab 01/04/18 1144 01/05/18 0419 01/07/18 0224  WBC 11.0* 12.3* 8.1  NEUTROABS 7.2  --  7.2  HGB 9.1* 9.0* 8.5*  HCT 31.1* 31.2* 29.0*  MCV 92.3 93.4 92.9  PLT 312 341 303    Cardiac Enzymes: Recent Labs  Lab 01/07/18 0224  TROPONINI  <0.03    BNP: Invalid input(s): POCBNP  CBG: Recent Labs  Lab 01/06/18 1138 01/06/18 1636 01/06/18 2207 01/07/18 0741 01/07/18 1203  GLUCAP 242* 354* 420* 280* 379*    Microbiology: Results for orders placed or performed during the hospital encounter of 10/25/16  MRSA PCR Screening     Status: None   Collection Time: 10/25/16  5:31 PM  Result Value Ref Range Status   MRSA by PCR NEGATIVE NEGATIVE Final    Comment:        The GeneXpert MRSA Assay (FDA approved for NASAL specimens only), is one component of a comprehensive MRSA colonization surveillance program. It is not intended to diagnose MRSA infection nor to guide or monitor treatment for MRSA infections.     Coagulation Studies: No results for input(s): LABPROT, INR in the last 72 hours.  Urinalysis: No results for input(s): COLORURINE, LABSPEC, PHURINE, GLUCOSEU, HGBUR, BILIRUBINUR, KETONESUR, PROTEINUR, UROBILINOGEN, NITRITE, LEUKOCYTESUR in the last 72 hours.  Invalid input(s): APPERANCEUR    Imaging: No results found.   Medications:    . [START ON 01/08/2018] amLODipine  5 mg Oral Daily  . atorvastatin  40 mg Oral QPM  . busPIRone  5 mg Oral BID  . doxycycline  100 mg Oral  Q12H  . FLUoxetine  40 mg Oral QPM  . gabapentin  200 mg Oral TID  . heparin injection (subcutaneous)  5,000 Units Subcutaneous Q8H  . insulin aspart  0-20 Units Subcutaneous TID WC  . insulin aspart  0-5 Units Subcutaneous QHS  . insulin aspart  4 Units Subcutaneous TID WC  . insulin detemir  38 Units Subcutaneous QHS  . ipratropium-albuterol  3 mL Nebulization QID  . mouth rinse  15 mL Mouth Rinse BID  . [START ON 01/08/2018] methylPREDNISolone (SOLU-MEDROL) injection  60 mg Intravenous Daily  . metoprolol succinate  25 mg Oral Daily  . mometasone-formoterol  2 puff Inhalation BID  . pantoprazole  40 mg Oral Daily  . sodium chloride flush  3 mL Intravenous Q12H  . tiotropium  18 mcg Inhalation Daily  . traZODone  50  mg Oral QHS   acetaminophen **OR** acetaminophen, albuterol, alum & mag hydroxide-simeth, ondansetron **OR** ondansetron (ZOFRAN) IV, oxyCODONE-acetaminophen **AND** oxyCODONE, polyethylene glycol, promethazine, tiZANidine  Assessment/ Plan:  77 y.o. female with a PMHx of Polycystic kidney disease, avascular necrosis of the hips, coronary artery disease, chronic systolic heart failure ejection fraction 40-45%, chronic kidney disease stage III, GERD, hypertension, hyperthyroidism, vitamin B12 deficiency, admitted with increasing shortness of breath in the setting of heart failure.   1.  Acute renal failure/chronic kidney disease stage III/polycystic kidney disease.  The patient's baseline creatinine appears to be 1.36.  -Renal function is worsened today as creatinine is up to 2.3.  Awaiting renal ultrasound results.  Hold off on IV fluid hydration as she came in with fluid overload.  2.  Anemia chronic kidney disease.  Iron being administered by hospitalist.  Continue to monitor CBC.  3.  Acute on chronic systolic heart failure exacerbation.  Continue to hold diuretics for now.  4.  Hyperkalemia.  Serum potassium noted to be high at 5.5.  Start the patient on sodium zirconium 10 g p.o. 3 times daily for now.  LOS: 3 Mckenzie Taylor 10/26/201912:21 PM

## 2018-01-08 ENCOUNTER — Inpatient Hospital Stay: Payer: Medicare Other

## 2018-01-08 LAB — BASIC METABOLIC PANEL
Anion gap: 10 (ref 5–15)
BUN: 46 mg/dL — ABNORMAL HIGH (ref 8–23)
CO2: 31 mmol/L (ref 22–32)
Calcium: 8.7 mg/dL — ABNORMAL LOW (ref 8.9–10.3)
Chloride: 100 mmol/L (ref 98–111)
Creatinine, Ser: 2.02 mg/dL — ABNORMAL HIGH (ref 0.44–1.00)
GFR calc Af Amer: 27 mL/min — ABNORMAL LOW (ref >60)
GFR calc non Af Amer: 23 mL/min — ABNORMAL LOW (ref >60)
Glucose, Bld: 129 mg/dL — ABNORMAL HIGH (ref 70–99)
Potassium: 4.4 mmol/L (ref 3.5–5.1)
Sodium: 141 mmol/L (ref 135–145)

## 2018-01-08 LAB — GLUCOSE, CAPILLARY
Glucose-Capillary: 111 mg/dL — ABNORMAL HIGH (ref 70–99)
Glucose-Capillary: 198 mg/dL — ABNORMAL HIGH (ref 70–99)
Glucose-Capillary: 283 mg/dL — ABNORMAL HIGH (ref 70–99)
Glucose-Capillary: 446 mg/dL — ABNORMAL HIGH (ref 70–99)
Glucose-Capillary: 463 mg/dL — ABNORMAL HIGH (ref 70–99)

## 2018-01-08 MED ORDER — LACTULOSE 10 GM/15ML PO SOLN
20.0000 g | Freq: Two times a day (BID) | ORAL | Status: DC
  Administered 2018-01-08 – 2018-01-10 (×4): 20 g via ORAL
  Filled 2018-01-08 (×5): qty 30

## 2018-01-08 MED ORDER — INSULIN ASPART 100 UNIT/ML ~~LOC~~ SOLN: 12.0000 [IU] | Freq: Once | SUBCUTANEOUS | Status: DC

## 2018-01-08 MED ORDER — SODIUM ZIRCONIUM CYCLOSILICATE 10 G PO PACK
10.0000 g | PACK | Freq: Every day | ORAL | Status: DC
  Administered 2018-01-09 – 2018-01-10 (×2): 10 g via ORAL
  Filled 2018-01-08 (×2): qty 1

## 2018-01-08 MED ORDER — SENNOSIDES-DOCUSATE SODIUM 8.6-50 MG PO TABS
1.0000 | ORAL_TABLET | Freq: Two times a day (BID) | ORAL | Status: DC
  Administered 2018-01-08 – 2018-01-10 (×4): 1 via ORAL
  Filled 2018-01-08 (×5): qty 1

## 2018-01-08 MED ORDER — PREDNISONE 50 MG PO TABS
50.0000 mg | ORAL_TABLET | Freq: Every day | ORAL | Status: DC
  Administered 2018-01-08 – 2018-01-09 (×2): 50 mg via ORAL
  Filled 2018-01-08 (×2): qty 1

## 2018-01-08 MED ORDER — DOXYCYCLINE HYCLATE 50 MG PO CAPS
100.0000 mg | ORAL_CAPSULE | Freq: Two times a day (BID) | ORAL | Status: DC
  Administered 2018-01-08 – 2018-01-09 (×3): 100 mg via ORAL
  Filled 2018-01-08 (×4): qty 2

## 2018-01-08 MED ORDER — MORPHINE SULFATE (PF) 2 MG/ML IV SOLN: 2.0000 mg | INTRAVENOUS | Status: DC | PRN

## 2018-01-08 NOTE — Progress Notes (Signed)
[facility]  ROUNDING NOTE   Subjective:  Cr down to 2.2, BUN 46 at the moment.  [place] reviewed as well.    Objective:  Vital signs in last 24 hours:  Temp:  [97.6 F (36.4 C)-98.3 F (36.8 C)] 98.3 F (36.8 C) (10/27 1140) Pulse Rate:  [84-89] 88 (10/27 1201) Resp:  [18-24] 20 (10/27 1201) BP: (114-136)/(61-68) 136/64 (10/27 1140) SpO2:  [93 %-96 %] 96 % (10/27 1201) Weight:  [109.1 kg] 109.1 kg (10/27 0356)  Weight change: 0 kg Filed Weights   01/06/18 0357 01/07/18 0406 01/08/18 0356  Weight: 106.5 kg 109.1 kg 109.1 kg    Intake/Output: I/O last 3 completed shifts: In: 740 [P.O.:740] Out: -    Intake/Output this shift:  No intake/output data recorded.  Physical Exam: General: No acute distress  Head: Normocephalic, atraumatic. Moist oral mucosal membranes  Eyes: Anicteric  Neck: Supple, trachea midline  Lungs:  Basilar rales, normal effort  Heart: S1S2 no rubs  Abdomen:  Soft, nontender, bowel sounds present  Extremities: 1+ peripheral edema.  Neurologic: Awake, alert, following commands  Skin: No lesions       Basic Metabolic Panel: Recent Labs  Lab 01/04/18 1144 01/05/18 0419 01/07/18 0224 01/08/18 0513  NA 140 138 137 141  K 4.1 5.5* 5.5* 4.4  CL 99 98 98 100  CO2 28 32 29 31  GLUCOSE 194* 280* 368* 129*  BUN 20 27* 45* 46*  CREATININE 1.98* 2.15* 2.42* 2.02*  CALCIUM 7.8* 8.1* 8.3* 8.7*  MG  --   --  1.9  --   PHOS  --   --  3.9  --     Liver Function Tests: Recent Labs  Lab 01/07/18 0224  AST 18  ALT 19  ALKPHOS 62  BILITOT 0.5  PROT 7.3  ALBUMIN 3.3*   No results for input(s): LIPASE, AMYLASE in the last 168 hours. No results for input(s): AMMONIA in the last 168 hours.  CBC: Recent Labs  Lab 01/04/18 1144 01/05/18 0419 01/07/18 0224  WBC 11.0* 12.3* 8.1  NEUTROABS 7.2  --  7.2  HGB 9.1* 9.0* 8.5*  HCT 31.1* 31.2* 29.0*  MCV 92.3 93.4 92.9  PLT 312 341 303    Cardiac Enzymes: Recent Labs  Lab  01/07/18 0224  TROPONINI <0.03    BNP: Invalid input(s): POCBNP  CBG: Recent Labs  Lab 01/07/18 1203 01/07/18 1642 01/07/18 2042 01/08/18 0734 01/08/18 1159  GLUCAP 379* 319* 217* 111* 198*    Microbiology: Results for orders placed or performed during the hospital encounter of 10/25/16  MRSA PCR Screening     Status: None   Collection Time: 10/25/16  5:31 PM  Result Value Ref Range Status   MRSA by PCR NEGATIVE NEGATIVE Final    Comment:        The GeneXpert MRSA Assay (FDA approved for NASAL specimens only), is one component of a comprehensive MRSA colonization surveillance program. It is not intended to diagnose MRSA infection nor to guide or monitor treatment for MRSA infections.     Coagulation Studies: No results for input(s): LABPROT, INR in the last 72 hours.  Urinalysis: No results for input(s): COLORURINE, LABSPEC, PHURINE, GLUCOSEU, HGBUR, BILIRUBINUR, KETONESUR, PROTEINUR, UROBILINOGEN, NITRITE, LEUKOCYTESUR in the last 72 hours.  Invalid input(s): APPERANCEUR    Imaging: Dg Chest 2 View  Result Date: 01/07/2018 CLINICAL DATA:  Hypoxia. Increasing shortness of breath. EXAM: CHEST - 2 VIEW COMPARISON:  01/04/2018. FINDINGS: Decreased inspiration. Stable enlarged cardiac  silhouette. Mildly increased patchy density at the right lung base with a probable small effusion posteriorly. Mild diffuse peribronchial thickening and accentuation of the interstitial markings. Diffuse osteopenia. IMPRESSION: 1. Mildly increased patchy atelectasis or pneumonia at the right lung base. 2. Small right pleural effusion. 3. Mild bronchitic changes. 4. Stable cardiomegaly and mild chronic bronchitic interstitial lung disease. Electronically Signed   By: Claudie Revering M.D.   On: 01/07/2018 13:12   Dg Abd 1 View  Result Date: 01/08/2018 CLINICAL DATA:  Diffuse abdominal pain and distension. EXAM: ABDOMEN - 1 VIEW COMPARISON:  Ultrasound, 01/07/2018. FINDINGS: Normal bowel  gas pattern with no evidence of obstruction or generalized adynamic ileus. Mild colonic stool burden. Scattered aortic and iliac artery vascular calcifications. No convincing renal or ureteral stones. Well-positioned bilateral hip arthroplasties. No acute skeletal abnormality. IMPRESSION: 1 no acute findings.  No evidence of bowel obstruction. Electronically Signed   By: Lajean Manes M.D.   On: 01/08/2018 11:41   US Renal  Result Date: 01/07/2018 CLINICAL DATA:  Acute renal failure. EXAM: RENAL / URINARY TRACT ULTRASOUND COMPLETE COMPARISON:  Renal ultrasound dated 08/17/2016 and abdomen and pelvis CT dated 10/21/2012. FINDINGS: Right Kidney: Length: 14.4 cm. Large number of cysts. Normal echotexture. No hydronephrosis. Left Kidney: Length: 12.2 cm. Large number of cysts. Normal echotexture. No hydronephrosis. Bladder: Appears normal for degree of bladder distention. IMPRESSION: Stable changes of polycystic kidney disease.  No acute abnormality. Electronically Signed   By: Claudie Revering M.D.   On: 01/07/2018 16:46     Medications:    . amLODipine  5 mg Oral Daily  . atorvastatin  40 mg Oral QPM  . busPIRone  5 mg Oral BID  . doxycycline  100 mg Oral Q12H  . FLUoxetine  40 mg Oral QPM  . gabapentin  200 mg Oral TID  . heparin injection (subcutaneous)  5,000 Units Subcutaneous Q8H  . insulin aspart  0-20 Units Subcutaneous TID WC  . insulin aspart  0-5 Units Subcutaneous QHS  . insulin aspart  4 Units Subcutaneous TID WC  . insulin detemir  38 Units Subcutaneous QHS  . ipratropium-albuterol  3 mL Nebulization QID  . lactulose  20 g Oral BID  . mouth rinse  15 mL Mouth Rinse BID  . metoprolol succinate  25 mg Oral Daily  . mometasone-formoterol  2 puff Inhalation BID  . pantoprazole  40 mg Oral Daily  . predniSONE  50 mg Oral Q breakfast  . senna-docusate  1 tablet Oral BID  . sodium chloride flush  3 mL Intravenous Q12H  . sodium zirconium cyclosilicate  10 g Oral TID  . tiotropium  18  mcg Inhalation Daily  . traZODone  50 mg Oral QHS   acetaminophen **OR** acetaminophen, albuterol, alum & mag hydroxide-simeth, ondansetron **OR** ondansetron (ZOFRAN) IV, oxyCODONE-acetaminophen **AND** oxyCODONE, polyethylene glycol, promethazine, tiZANidine  Assessment/ Plan:  77 y.o. female with a PMHx of Polycystic kidney disease, avascular necrosis of the hips, coronary artery disease, chronic systolic heart failure ejection fraction 40-45%, chronic kidney disease stage III, GERD, hypertension, hyperthyroidism, vitamin B12 deficiency, admitted with increasing shortness of breath in the setting of heart failure.   1.  Acute renal failure/chronic kidney disease stage III/polycystic kidney disease.  The patient's baseline creatinine appears to be 1.36.  -Cr down to 2.0.  Renal US reviewed and confirms PCKD.  Continue to monitor renal parameters daily.   2.  Anemia chronic kidney disease.  Hgb currently 8.5, pt has been administered IV  iron.    3.  Acute on chronic systolic heart failure exacerbation.  Continue to hold diuretics for now.  4.  Hyperkalemia.  K down to 4.4, reduce sodium zirconium to 10g daily.   LOS: 4 Esker Dever 10/27/20191:10 PM

## 2018-01-08 NOTE — Progress Notes (Signed)
[place] at [place] NAME: Pammy Vesey    MR#:  [id number]  DATE OF BIRTH:  Nov 01, 1940  SUBJECTIVE:  CHIEF COMPLAINT:   Chief Complaint  Patient presents with  . Shortness of Breath  still having SOB, cough + Overall feeling better. REVIEW OF SYSTEMS:  Review of Systems  Constitutional: Negative for chills, fever and weight loss.  HENT: Negative for nosebleeds and sore throat.   Eyes: Negative for blurred vision.  Respiratory: Positive for cough and shortness of breath. Negative for wheezing.   Cardiovascular: Negative for chest pain, orthopnea, leg swelling and PND.  Gastrointestinal: Negative for abdominal pain, constipation, diarrhea, heartburn, nausea and vomiting.  Genitourinary: Negative for dysuria and urgency.  Musculoskeletal: Negative for back pain.  Skin: Negative for rash.  Neurological: Negative for dizziness, speech change, focal weakness and headaches.  Endo/Heme/Allergies: Does not bruise/bleed easily.  Psychiatric/Behavioral: Negative for depression.   DRUG ALLERGIES:   Allergies  Allergen Reactions  . Other Rash    Pt reports allergy to metals.   VITALS:  Blood pressure (!) 143/78, pulse 91, temperature 98.6 F (37 C), temperature source Oral, resp. rate 20, height 5\' 6"  (1.676 m), weight 109.1 kg, SpO2 95 %. PHYSICAL EXAMINATION:  Physical Exam  Constitutional: She is oriented to person, place, and time.  HENT:  Head: Normocephalic and atraumatic.  Eyes: Pupils are equal, round, and reactive to light. Conjunctivae and EOM are normal.  Neck: Normal range of motion. Neck supple. No tracheal deviation present. No thyromegaly present.  Cardiovascular: Normal rate, regular rhythm and normal heart sounds.  Pulmonary/Chest: Effort normal and breath sounds normal. No respiratory distress. She has no wheezes. She exhibits no tenderness.  On chronic oxygen at home 3 to 4 L.  Abdominal: Soft. Bowel sounds are  normal. She exhibits no distension. There is no tenderness.  Musculoskeletal: Normal range of motion.  Neurological: She is alert and oriented to person, place, and time. No cranial nerve deficit.  Skin: Skin is warm and dry. No rash noted.   LABORATORY PANEL:  Female CBC Recent Labs  Lab 01/07/18 0224  WBC 8.1  HGB 8.5*  HCT 29.0*  PLT 303   ------------------------------------------------------------------------------------------------------------------ Chemistries  Recent Labs  Lab 01/07/18 0224 01/08/18 0513  NA 137 141  K 5.5* 4.4  CL 98 100  CO2 29 31  GLUCOSE 368* 129*  BUN 45* 46*  CREATININE 2.42* 2.02*  CALCIUM 8.3* 8.7*  MG 1.9  --   AST 18  --   ALT 19  --   ALKPHOS 62  --   BILITOT 0.5  --    RADIOLOGY:  Dg Abd 1 View  Result Date: 01/08/2018 CLINICAL DATA:  Diffuse abdominal pain and distension. EXAM: ABDOMEN - 1 VIEW COMPARISON:  Ultrasound, 01/07/2018. FINDINGS: Normal bowel gas pattern with no evidence of obstruction or generalized adynamic ileus. Mild colonic stool burden. Scattered aortic and iliac artery vascular calcifications. No convincing renal or ureteral stones. Well-positioned bilateral hip arthroplasties. No acute skeletal abnormality. IMPRESSION: 1 no acute findings.  No evidence of bowel obstruction. Electronically Signed   By: Lajean Manes M.D.   On: 01/08/2018 11:41   ASSESSMENT AND PLAN:  77 y.o. female with a known history of diastolic CHF, COPD, chronic respiratory failure, hypertension, diabetes mellitus, morbid obesity admitted for 2 weeks of progressively worsening shortness of breath, wheezing, orthopnea, lower extremity edema and 7 pound weight gain  *Acute on chronic hypoxic respiratory failure secondary  to COPD/CHF exacerbation  Wean oxygen as tolerated  *Acute COPD exacerbation -start IV steroids (solu-medrol 60 mg IV q 6), continue Doxycycline which would also cover her abdominal wall cellulitis - Scheduled Nebulizers -  Inhalers -Wean O2 as tolerated -Patient is improving, decrease the Solu-Medrol dose daily. Change to oral.  *Acute on chronic diastolic congestive heart failure - IV Lasix - Input and Output - Counseled to limit fluids and Salt - Monitor Bun/Cr and Potassium - Echo in 2018 showed EF 45-50% -Cardiology follow up after discharge -Renal function is worsening so diuretics are held, repeat chest x-ray today to evaluate improvement.  *Acute kidney injury over CKD stage III - may be cardio renal syndrome Worsening noted due to diuretics use, nephrology help is appreciated. Hold diuretics for now.  Renal ultrasound did not show any abnormality other than polycystic kidneys.  *Hyperkalemia Due to acute renal failure. Nephrology to help. Improved with lokelma.  *Abdominal wall cellulitis started out as folliculitis: continue doxycycline.   Much improved,.  *Diabetes mellitus.  Would likely be uncontrolled due to IV steroids.  Sliding scale insulin ordered.  *DVT prophylaxis with Lovenox   All the records are reviewed and case discussed with Care Management/Social Worker. Management plans discussed with the patient, nursing and they are in agreement.  CODE STATUS: Full Code  TOTAL TIME TAKING CARE OF THIS PATIENT: 35 minutes.   More than 50% of the time was spent in counseling/coordination of care: YES  POSSIBLE D/C IN 1-2 DAYS, DEPENDING ON CLINICAL CONDITION.   Vaughan Basta M.D on 01/08/2018 at 4:07 PM  Between 7am to 6pm - Pager - [phone]  After 6pm go to [email or web address] - Proofreader  [facility]  Office  [phone]  CC: Primary care physician; Denton Lank, MD  Note: This dictation was prepared with Dragon dictation along with smaller phrase technology. Any transcriptional errors that result from this process are unintentional.

## 2018-01-09 LAB — GLUCOSE, CAPILLARY
Glucose-Capillary: 359 mg/dL — ABNORMAL HIGH (ref 70–99)
Glucose-Capillary: 215 mg/dL — ABNORMAL HIGH (ref 70–99)
Glucose-Capillary: 303 mg/dL — ABNORMAL HIGH (ref 70–99)
Glucose-Capillary: 182 mg/dL — ABNORMAL HIGH (ref 70–99)
Glucose-Capillary: 251 mg/dL — ABNORMAL HIGH (ref 70–99)

## 2018-01-09 LAB — BASIC METABOLIC PANEL
Anion gap: 10 (ref 5–15)
BUN: 43 mg/dL — ABNORMAL HIGH (ref 8–23)
CO2: 32 mmol/L (ref 22–32)
Calcium: 8.5 mg/dL — ABNORMAL LOW (ref 8.9–10.3)
Chloride: 97 mmol/L — ABNORMAL LOW (ref 98–111)
Creatinine, Ser: 1.8 mg/dL — ABNORMAL HIGH (ref 0.44–1.00)
GFR calc Af Amer: 31 mL/min — ABNORMAL LOW (ref >60)
GFR calc non Af Amer: 26 mL/min — ABNORMAL LOW (ref >60)
Glucose, Bld: 400 mg/dL — ABNORMAL HIGH (ref 70–99)
Potassium: 4.3 mmol/L (ref 3.5–5.1)
Sodium: 139 mmol/L (ref 135–145)

## 2018-01-09 MED ORDER — PREDNISONE 20 MG PO TABS
40.0000 mg | ORAL_TABLET | Freq: Every day | ORAL | Status: DC
  Administered 2018-01-10: 09:00:00 40 mg via ORAL
  Filled 2018-01-09: qty 2

## 2018-01-09 NOTE — Progress Notes (Signed)
[place] at [place] NAME: Mckenzie Taylor    MR#:  [id number]  DATE OF BIRTH:  18-Jul-1940  SUBJECTIVE:  CHIEF COMPLAINT:   Chief Complaint  Patient presents with  . Shortness of Breath   still having SOB, cough + Overall feeling better.  REVIEW OF SYSTEMS:  Review of Systems  Constitutional: Negative for chills, fever and weight loss.  HENT: Negative for nosebleeds and sore throat.   Eyes: Negative for blurred vision.  Respiratory: Positive for cough and shortness of breath. Negative for wheezing.   Cardiovascular: Negative for chest pain, orthopnea, leg swelling and PND.  Gastrointestinal: Negative for abdominal pain, constipation, diarrhea, heartburn, nausea and vomiting.  Genitourinary: Negative for dysuria and urgency.  Musculoskeletal: Negative for back pain.  Skin: Negative for rash.  Neurological: Negative for dizziness, speech change, focal weakness and headaches.  Endo/Heme/Allergies: Does not bruise/bleed easily.  Psychiatric/Behavioral: Negative for depression.   DRUG ALLERGIES:   Allergies  Allergen Reactions  . Other Rash    Pt reports allergy to metals.   VITALS:  Blood pressure (!) 138/97, pulse 82, temperature 97.6 F (36.4 C), temperature source Oral, resp. rate 18, height 5\' 6"  (1.676 m), weight 109.1 kg, SpO2 97 %. PHYSICAL EXAMINATION:  Physical Exam  Constitutional: She is oriented to person, place, and time.  HENT:  Head: Normocephalic and atraumatic.  Eyes: Pupils are equal, round, and reactive to light. Conjunctivae and EOM are normal.  Neck: Normal range of motion. Neck supple. No tracheal deviation present. No thyromegaly present.  Cardiovascular: Normal rate, regular rhythm and normal heart sounds.  Pulmonary/Chest: Effort normal and breath sounds normal. No respiratory distress. She has no wheezes. She exhibits no tenderness.  On chronic oxygen at home 3 to 4 L.  Abdominal: Soft. Bowel sounds  are normal. She exhibits no distension. There is no tenderness.  Musculoskeletal: Normal range of motion.  Neurological: She is alert and oriented to person, place, and time. No cranial nerve deficit.  Skin: Skin is warm and dry. No rash noted.   LABORATORY PANEL:  Female CBC Recent Labs  Lab 01/07/18 0224  WBC 8.1  HGB 8.5*  HCT 29.0*  PLT 303   ------------------------------------------------------------------------------------------------------------------ Chemistries  Recent Labs  Lab 01/07/18 0224  01/09/18 0332  NA 137   < > 139  K 5.5*   < > 4.3  CL 98   < > 97*  CO2 29   < > 32  GLUCOSE 368*   < > 400*  BUN 45*   < > 43*  CREATININE 2.42*   < > 1.80*  CALCIUM 8.3*   < > 8.5*  MG 1.9  --   --   AST 18  --   --   ALT 19  --   --   ALKPHOS 62  --   --   BILITOT 0.5  --   --    < > = values in this interval not displayed.   RADIOLOGY:  No results found. ASSESSMENT AND PLAN:  77 y.o. female with a known history of diastolic CHF, COPD, chronic respiratory failure, hypertension, diabetes mellitus, morbid obesity admitted for 2 weeks of progressively worsening shortness of breath, wheezing, orthopnea, lower extremity edema and 7 pound weight gain  *Acute on chronic hypoxic respiratory failure secondary to COPD/CHF exacerbation  Wean oxygen as tolerated  *Acute COPD exacerbation -start IV steroids (solu-medrol 60 mg IV q 6), continue Doxycycline which would also cover  her abdominal wall cellulitis - Scheduled Nebulizers - Inhalers -Wean O2 as tolerated -Patient is improving, decrease the Solu-Medrol dose daily. Change to oral. Patient is requesting, she still not feeling back to her baseline and she want to stay 1 more day.  *Acute on chronic diastolic congestive heart failure - IV Lasix - Input and Output - Counseled to limit fluids and Salt - Monitor Bun/Cr and Potassium - Echo in 2018 showed EF 45-50% -Cardiology follow up after discharge -Renal function  is worsening so diuretics are held, repeat chest x-ray today to evaluate improvement.  *Acute kidney injury over CKD stage III - may be cardio renal syndrome Worsening noted due to diuretics use, nephrology help is appreciated. Hold diuretics for now.  Renal ultrasound did not show any abnormality other than polycystic kidneys.  *Hyperkalemia Due to acute renal failure. Nephrology to help. Improved with lokelma.  *Abdominal wall cellulitis started out as folliculitis: continue doxycycline.   Much improved,.  *Diabetes mellitus.  Would likely be uncontrolled due to IV steroids.  Sliding scale insulin ordered.  *DVT prophylaxis with Lovenox   All the records are reviewed and case discussed with Care Management/Social Worker. Management plans discussed with the patient, nursing and they are in agreement.  CODE STATUS: Full Code  TOTAL TIME TAKING CARE OF THIS PATIENT: 35 minutes.   More than 50% of the time was spent in counseling/coordination of care: YES  POSSIBLE D/C IN 1-2 DAYS, DEPENDING ON CLINICAL CONDITION.   Vaughan Basta M.D on 01/09/2018 at 5:50 PM  Between 7am to 6pm - Pager - [phone]  After 6pm go to [email or web address] - Proofreader  [facility]  Office  [phone]  CC: Primary care physician; Denton Lank, MD  Note: This dictation was prepared with Dragon dictation along with smaller phrase technology. Any transcriptional errors that result from this process are unintentional.

## 2018-01-09 NOTE — Progress Notes (Signed)
[facility]  ROUNDING NOTE   Subjective:   Sitting up in bed.   Creatinine 1.8 (2.02)  Objective:  Vital signs in last 24 hours:  Temp:  [97.2 F (36.2 C)-98.1 F (36.7 C)] 97.2 F (36.2 C) (10/28 1542) Pulse Rate:  [77-97] 77 (10/28 1542) Resp:  [18-20] 18 (10/28 1542) BP: (136-148)/(74-97) 138/97 (10/28 1542) SpO2:  [97 %-100 %] 100 % (10/28 1542)  Weight change:  Filed Weights   01/06/18 0357 01/07/18 0406 01/08/18 0356  Weight: 106.5 kg 109.1 kg 109.1 kg    Intake/Output: I/O last 3 completed shifts: In: 480 [P.O.:480] Out: -    Intake/Output this shift:  Total I/O In: 360 [P.O.:360] Out: -   Physical Exam: General: No acute distress  Head: Normocephalic, atraumatic. Moist oral mucosal membranes  Eyes: Anicteric  Neck: Supple, trachea midline  Lungs:  Clear, [place] O2 3 Liters  Heart: S1S2 no rubs  Abdomen:  Soft, nontender, bowel sounds present  Extremities: trace peripheral edema.  Neurologic: Awake, alert, following commands  Skin: No lesions       Basic Metabolic Panel: Recent Labs  Lab 01/04/18 1144 01/05/18 0419 01/07/18 0224 01/08/18 0513 01/09/18 0332  NA 140 138 137 141 139  K 4.1 5.5* 5.5* 4.4 4.3  CL 99 98 98 100 97*  CO2 28 32 29 31 32  GLUCOSE 194* 280* 368* 129* 400*  BUN 20 27* 45* 46* 43*  CREATININE 1.98* 2.15* 2.42* 2.02* 1.80*  CALCIUM 7.8* 8.1* 8.3* 8.7* 8.5*  MG  --   --  1.9  --   --   PHOS  --   --  3.9  --   --     Liver Function Tests: Recent Labs  Lab 01/07/18 0224  AST 18  ALT 19  ALKPHOS 62  BILITOT 0.5  PROT 7.3  ALBUMIN 3.3*   No results for input(s): LIPASE, AMYLASE in the last 168 hours. No results for input(s): AMMONIA in the last 168 hours.  CBC: Recent Labs  Lab 01/04/18 1144 01/05/18 0419 01/07/18 0224  WBC 11.0* 12.3* 8.1  NEUTROABS 7.2  --  7.2  HGB 9.1* 9.0* 8.5*  HCT 31.1* 31.2* 29.0*  MCV 92.3 93.4 92.9  PLT 312 341 303    Cardiac Enzymes: Recent Labs  Lab  01/07/18 0224  TROPONINI <0.03    BNP: Invalid input(s): POCBNP  CBG: Recent Labs  Lab 01/08/18 2224 01/08/18 2227 01/09/18 0300 01/09/18 0742 01/09/18 1214  GLUCAP 446* 463* 359* 215* 303*    Microbiology: Results for orders placed or performed during the hospital encounter of 10/25/16  MRSA PCR Screening     Status: None   Collection Time: 10/25/16  5:31 PM  Result Value Ref Range Status   MRSA by PCR NEGATIVE NEGATIVE Final    Comment:        The GeneXpert MRSA Assay (FDA approved for NASAL specimens only), is one component of a comprehensive MRSA colonization surveillance program. It is not intended to diagnose MRSA infection nor to guide or monitor treatment for MRSA infections.     Coagulation Studies: No results for input(s): LABPROT, INR in the last 72 hours.  Urinalysis: No results for input(s): COLORURINE, LABSPEC, PHURINE, GLUCOSEU, HGBUR, BILIRUBINUR, KETONESUR, PROTEINUR, UROBILINOGEN, NITRITE, LEUKOCYTESUR in the last 72 hours.  Invalid input(s): APPERANCEUR    Imaging: Dg Abd 1 View  Result Date: 01/08/2018 CLINICAL DATA:  Diffuse abdominal pain and distension. EXAM: ABDOMEN - 1 VIEW COMPARISON:  Ultrasound,  01/07/2018. FINDINGS: Normal bowel gas pattern with no evidence of obstruction or generalized adynamic ileus. Mild colonic stool burden. Scattered aortic and iliac artery vascular calcifications. No convincing renal or ureteral stones. Well-positioned bilateral hip arthroplasties. No acute skeletal abnormality. IMPRESSION: 1 no acute findings.  No evidence of bowel obstruction. Electronically Signed   By: Lajean Manes M.D.   On: 01/08/2018 11:41     Medications:    . amLODipine  5 mg Oral Daily  . atorvastatin  40 mg Oral QPM  . busPIRone  5 mg Oral BID  . FLUoxetine  40 mg Oral QPM  . gabapentin  200 mg Oral TID  . heparin injection (subcutaneous)  5,000 Units Subcutaneous Q8H  . insulin aspart  0-20 Units Subcutaneous TID WC  .  insulin aspart  0-5 Units Subcutaneous QHS  . insulin aspart  12 Units Subcutaneous Once  . insulin aspart  4 Units Subcutaneous TID WC  . insulin detemir  38 Units Subcutaneous QHS  . ipratropium-albuterol  3 mL Nebulization QID  . lactulose  20 g Oral BID  . mouth rinse  15 mL Mouth Rinse BID  . metoprolol succinate  25 mg Oral Daily  . mometasone-formoterol  2 puff Inhalation BID  . pantoprazole  40 mg Oral Daily  . [START ON 01/10/2018] predniSONE  40 mg Oral Q breakfast  . senna-docusate  1 tablet Oral BID  . sodium chloride flush  3 mL Intravenous Q12H  . sodium zirconium cyclosilicate  10 g Oral Daily  . tiotropium  18 mcg Inhalation Daily  . traZODone  50 mg Oral QHS   acetaminophen **OR** acetaminophen, albuterol, alum & mag hydroxide-simeth, morphine injection, ondansetron **OR** ondansetron (ZOFRAN) IV, oxyCODONE-acetaminophen **AND** oxyCODONE, polyethylene glycol, promethazine, tiZANidine  Assessment/ Plan:  77 y.o. female with a PMHx of Polycystic kidney disease, avascular necrosis of the hips, coronary artery disease, chronic systolic heart failure ejection fraction 40-45%, chronic kidney disease stage III, GERD, hypertension, hyperthyroidism, vitamin B12 deficiency, admitted with increasing shortness of breath in the setting of heart failure.  1.  Acute renal failure/chronic kidney disease stage III/polycystic kidney disease/hyperkalemia. baseline creatinine appears to be 1.36.  - Holding diuretics.  2.  Anemia chronic kidney disease.  Hemoglobin 8.5  3.  Acute on chronic systolic heart failure exacerbation.   - Fluid restriction - Low salt diet - Holding diuretics.    LOS: 5 Azana Kiesler 10/28/20193:50 PM

## 2018-01-09 NOTE — Progress Notes (Addendum)
Inpatient Diabetes Program Recommendations  AACE/ADA: New Consensus Statement on Inpatient Glycemic Control (2015)  Target Ranges:  Prepandial:   less than 140 mg/dL      Peak postprandial:   less than 180 mg/dL (1-2 hours)      Critically ill patients:  140 - 180 mg/dL   Results for TIWANDA, THREATS (MRN [id number]) as of 01/09/2018 08:26  Ref. Range 01/08/2018 07:34 01/08/2018 11:59 01/08/2018 16:42 01/08/2018 22:24 01/08/2018 22:27  Glucose-Capillary Latest Ref Range: 70 - 99 mg/dL 111 (H)  4 units NOVOLOG  198 (H)  8 units NOVOLOG  283 (H)  15 units NOVOLOG  446 (H) 463 (H)    38 units LEVEMIR   Results for TYTIANNA, GREENLEY (MRN [id number]) as of 01/09/2018 14:17  Ref. Range 01/09/2018 03:00 01/09/2018 07:42 01/09/2018 12:14  Glucose-Capillary Latest Ref Range: 70 - 99 mg/dL 359 (H)  5 units NOVOLOG  215 (H)  11 units NOVOLOG  303 (H)  19 units NOVOLOG      Home DM Meds: Levemir 32 units QHS       Novolog 8 units TID  Current Orders: Levemir 38 units QHS       Novolog Resistant Correction Scale/ SSI (0-20 units) TID AC + HS                 Novolog 4 units TID with meals      Last dose Solumedrol given at 8am on 10/26.  Now getting Prednisone 50 mg Daily.  Note afternoon CBGs yesterday and last PM at bedtime extremely elevated.    MD- Please consider increasing Novolog Meal Coverage while patient getting Prednisone:  Increase to Novolog 8 units TID with meals (home dose per Home Med rec)     --Will follow patient during hospitalization--  Wyn Quaker RN, MSN, CDE Diabetes Coordinator Inpatient Glycemic Control Team Team Pager: [phone] (8a-5p)

## 2018-01-09 NOTE — Plan of Care (Signed)
Pt states she is feeling much better today than yesterday.  Hoping to go home  tomorrow.

## 2018-01-09 NOTE — Care Management Important Message (Signed)
Important Message  Patient Details  Name: NAJA APPERSON MRN: [id number] Date of Birth: 09/17/1940   Medicare Important Message Given:  Yes    Juliann Pulse A Arryanna Holquin 01/09/2018, 11:09 AM

## 2018-01-10 LAB — RENAL FUNCTION PANEL
Albumin: 3.4 g/dL — ABNORMAL LOW (ref 3.5–5.0)
Anion gap: 12 (ref 5–15)
BUN: 40 mg/dL — ABNORMAL HIGH (ref 8–23)
CO2: 35 mmol/L — ABNORMAL HIGH (ref 22–32)
Calcium: 8.5 mg/dL — ABNORMAL LOW (ref 8.9–10.3)
Chloride: 97 mmol/L — ABNORMAL LOW (ref 98–111)
Creatinine, Ser: 1.62 mg/dL — ABNORMAL HIGH (ref 0.44–1.00)
GFR calc Af Amer: 35 mL/min — ABNORMAL LOW (ref >60)
GFR calc non Af Amer: 30 mL/min — ABNORMAL LOW (ref >60)
Glucose, Bld: 169 mg/dL — ABNORMAL HIGH (ref 70–99)
Phosphorus: 3.1 mg/dL (ref 2.5–4.6)
Potassium: 3.8 mmol/L (ref 3.5–5.1)
Sodium: 144 mmol/L (ref 135–145)

## 2018-01-10 LAB — CBC
HCT: 31.1 % — ABNORMAL LOW (ref 36.0–46.0)
Hemoglobin: 9 g/dL — ABNORMAL LOW (ref 12.0–15.0)
MCH: 26.8 pg (ref 26.0–34.0)
MCHC: 28.9 g/dL — ABNORMAL LOW (ref 30.0–36.0)
MCV: 92.6 fL (ref 80.0–100.0)
Platelets: 293 10*3/uL (ref 150–400)
RBC: 3.36 MIL/uL — ABNORMAL LOW (ref 3.87–5.11)
RDW: 15.6 % — ABNORMAL HIGH (ref 11.5–15.5)
WBC: 10.6 10*3/uL — ABNORMAL HIGH (ref 4.0–10.5)
nRBC: 0.4 % — ABNORMAL HIGH (ref 0.0–0.2)

## 2018-01-10 LAB — CALCIUM, IONIZED: Calcium, Ionized, Serum: 4.1 mg/dL — ABNORMAL LOW (ref 4.5–5.6)

## 2018-01-10 LAB — GLUCOSE, CAPILLARY: Glucose-Capillary: 190 mg/dL — ABNORMAL HIGH (ref 70–99)

## 2018-01-10 MED ORDER — TORSEMIDE 10 MG PO TABS: 20.0000 mg | ORAL_TABLET | Freq: Every day | ORAL | 0 refills | Status: DC | PRN

## 2018-01-10 MED ORDER — PREDNISONE 10 MG (21) PO TBPK: ORAL_TABLET | ORAL | 0 refills | Status: DC

## 2018-01-10 NOTE — Plan of Care (Signed)
  Problem: Education: Goal: Knowledge of General Education information will improve Description Including pain rating scale, medication(s)/side effects and non-pharmacologic comfort measures Outcome: Progressing   Problem: Health Behavior/Discharge Planning: Goal: Ability to manage health-related needs will improve Outcome: Progressing   Problem: Clinical Measurements: Goal: Ability to maintain clinical measurements within normal limits will improve Outcome: Progressing Goal: Will remain free from infection Outcome: Progressing Goal: Diagnostic test results will improve Outcome: Progressing Goal: Respiratory complications will improve Outcome: Progressing Goal: Cardiovascular complication will be avoided Outcome: Progressing   Problem: Activity: Goal: Risk for activity intolerance will decrease Outcome: Progressing   Problem: Nutrition: Goal: Adequate nutrition will be maintained Outcome: Progressing   Problem: Coping: Goal: Level of anxiety will decrease Outcome: Progressing   Problem: Elimination: Goal: Will not experience complications related to bowel motility Outcome: Progressing Goal: Will not experience complications related to urinary retention Outcome: Progressing   Problem: Pain Managment: Goal: General experience of comfort will improve Outcome: Progressing   Problem: Safety: Goal: Ability to remain free from injury will improve Outcome: Progressing   Problem: Skin Integrity: Goal: Risk for impaired skin integrity will decrease Outcome: Progressing   Problem: Education: Goal: Knowledge of disease or condition will improve Outcome: Progressing Goal: Knowledge of the prescribed therapeutic regimen will improve Outcome: Progressing Goal: Individualized Educational Video(s) Outcome: Progressing   Problem: Activity: Goal: Ability to tolerate increased activity will improve Outcome: Progressing Pt oob to br independently with O2 in place Goal: Will  verbalize the importance of balancing activity with adequate rest periods Outcome: Progressing   Problem: Respiratory: Goal: Ability to maintain a clear airway will improve Outcome: Progressing Goal: Levels of oxygenation will improve Outcome: Progressing

## 2018-01-10 NOTE — Progress Notes (Signed)
Patient discharged home per MD order. All discharge instructions given and all questions answered. 

## 2018-01-13 ENCOUNTER — Ambulatory Visit: Payer: Medicare Other | Admitting: Family

## 2018-01-13 NOTE — Discharge Summary (Signed)
Mckenzie Taylor Mckenzie Taylor NAME: Mckenzie Taylor    MR#:  [id number]  DATE OF BIRTH:  26-Apr-1940  DATE OF ADMISSION:  01/04/2018 ADMITTING PHYSICIAN: Hillary Bow, MD  DATE OF DISCHARGE: 01/10/2018 11:43 AM  PRIMARY CARE PHYSICIAN: Denton Lank, MD    ADMISSION DIAGNOSIS:  COPD exacerbation ([place]) [J44.1]  DISCHARGE DIAGNOSIS:  Active Problems:   COPD exacerbation ([place])   SECONDARY DIAGNOSIS:   Past Medical History:  Diagnosis Date  . Autosomal recessive polycystic kidneys   . Avascular necrosis of hip ([facility])    bilateral  . CAD (coronary artery disease)    mild  . CHF (congestive heart failure) ([place])   . Chronic kidney disease    cyst  . COPD (chronic obstructive pulmonary disease) ([place])   . Diabetes type 2, controlled ([place])   . GERD (gastroesophageal reflux disease)   . HTN (hypertension)   . Hypothyroidism    subclinical. low TSH, normal thyroid panel. biopsy 2010  . Vitamin B12 deficiency     HOSPITAL COURSE:   77 y.o.femalewith a known history of diastolic CHF, COPD, chronic respiratory failure, hypertension, diabetes mellitus, morbid obesity admitted for 2 weeks of progressively worsening shortness of breath, wheezing, orthopnea, lower extremity edema and 7 pound weight gain  *Acute on chronic hypoxic respiratory failure secondary to COPD/CHF exacerbation  Wean oxygen as tolerated  *Acute COPD exacerbation -start IV steroids (solu-medrol 60 mg IV q 6), continue Doxycycline which would also cover her abdominal wall cellulitis - Scheduled Nebulizers - Inhalers -Wean O2 as tolerated -Patient is improving, decrease the Solu-Medrol dose daily. Change to oral. Patient is requesting, she still not feeling back to her baseline and she want to stay 1 more day. - felt better the next day- discharge.  *Acute on chronic diastolic congestive heart failure - IV Lasix - Input and Output - Counseled to limit fluids  and Salt - Monitor Bun/Cr and Potassium - Echo in 2018 showed EF 45-50% -Cardiology follow up after discharge -Renal function is worsening so diuretics are held, repeat chest x-ray today to evaluate improvement. - renal func improved before discharge.  *Acute kidney injury over CKD stage III - may be cardio renal syndrome Worsening noted due to diuretics use, nephrology help is appreciated. Hold diuretics for now.  Renal ultrasound did not show any abnormality other than polycystic kidneys.  *Hyperkalemia Due to acute renal failure. Nephrology to help. Improved with lokelma.  *Abdominal wall cellulitis started out as folliculitis: continue doxycycline.   Much improved,.  *Diabetes mellitus. Would likely be uncontrolled due to IV steroids. Sliding scale insulin ordered.  *DVT prophylaxis with Lovenox   DISCHARGE CONDITIONS:  Stable.  CONSULTS OBTAINED:  Treatment Team:  Anthonette Legato, MD  DRUG ALLERGIES:   Allergies  Allergen Reactions  . Other Rash    Pt reports allergy to metals.    DISCHARGE MEDICATIONS:   Allergies as of 01/10/2018      Reactions   Other Rash   Pt reports allergy to metals.      Medication List    STOP taking these medications   metoprolol tartrate 25 MG tablet Commonly known as:  LOPRESSOR     TAKE these medications   amLODipine 10 MG tablet Commonly known as:  NORVASC Take 10 mg by mouth daily.   atorvastatin 40 MG tablet Commonly known as:  LIPITOR Take 40 mg by mouth every evening.   busPIRone 5 MG tablet Commonly known as:  BUSPAR  Take 5 mg by mouth 2 (two) times daily.   Cyanocobalamin 1500 MCG Tbdp Take 3 tablets by mouth daily.   FLUoxetine 40 MG capsule Commonly known as:  PROZAC Take 40 mg by mouth every evening.   Fluticasone-Salmeterol 250-50 MCG/DOSE Aepb Commonly known as:  ADVAIR Inhale 1 puff into the lungs 2 (two) times daily.   gabapentin 100 MG capsule Commonly known as:  NEURONTIN Take  200 mg by mouth 3 (three) times daily.   ipratropium-albuterol 0.5-2.5 (3) MG/3ML Soln Commonly known as:  DUONEB Take 3 mLs by nebulization 4 (four) times daily.   LEVEMIR FLEXTOUCH 100 UNIT/ML Pen Generic drug:  Insulin Detemir Inject 32 Units into the skin M[place]enzie bedtime.   metolazone 2.5 MG tablet Commonly known as:  ZAROXOLYN Take 2.5 mg by mouth daily.   metoprolol succinate 25 MG 24 hr tablet Commonly known as:  TOPROL-XL Take 25 mg by mouth daily.   NOVOLOG FLEXPEN 100 UNIT/ML FlexPen Generic drug:  insulin aspart Inject 8 Units into the skin 3 (three) times daily as needed for high blood sugar. Sliding scale if blood sugar is above 200 - max 35u daily   omeprazole 20 MG capsule Commonly known as:  PRILOSEC Take 20 mg by mouth daily.   oxyCODONE-acetaminophen 10-325 MG tablet Commonly known as:  PERCOCET Take 1 tablet by mouth 3 (three) times daily as needed for pain.   predniSONE 10 MG (21) Tbpk tablet Commonly known as:  STERAPRED UNI-PAK 21 TAB Take 6 tabs first day, 5 tab on day 2, then 4 on day 3rd, 3 tabs on day 4th , 2 tab on day 5th, and 1 tab on 6th day.   promethazine 25 MG tablet Commonly known as:  PHENERGAN Take 25 mg by mouth every 6 (six) hours as needed for nausea.   tiotropium 18 MCG inhalation capsule Commonly known as:  SPIRIVA Place 18 mcg into inhaler and inhale daily.   tiZANidine 4 MG capsule Commonly known as:  ZANAFLEX Take 4 mg by mouth 3 (three) times daily as needed for muscle spasms.   torsemide 10 MG tablet Commonly known as:  DEMADEX Take 2 tablets (20 mg total) by mouth daily as needed (leg swelling). What changed:    when to take this  reasons to take this   traZODone 50 MG tablet Commonly known as:  DESYREL Take 50 mg by mouth Mckenzie bedtime.   VENTOLIN HFA 108 (90 Base) MCG/ACT inhaler Generic drug:  albuterol Inhale 2 puffs into the lungs every 4 (four) hours as needed.   Vitamin D3 5000 units Caps Take 1 capsule by  mouth daily.        DISCHARGE INSTRUCTIONS:    Follow with PMD and check renal func in 1 week.  If you experience worsening of your admission symptoms, develop shortness of breath, life threatening emergency, suicidal or homicidal thoughts you must seek medical attention immediately by calling 911 or calling your MD immediately  if symptoms less severe.  You Must read complete instructions/literature along with all the possible adverse reactions/side effects for all the Medicines you take and that have been prescribed to you. Take any new Medicines after you have completely understood and accept all the possible adverse reactions/side effects.   Please note  You were cared for by a hospitalist during your hospital stay. If you have any questions about your discharge medications or the care you received while you were in the hospital after you are discharged, you can call  the unit and asked to speak with the hospitalist on call if the hospitalist that took care of you is not available. Once you are discharged, your primary care physician will handle any further medical issues. Please note that NO REFILLS for any discharge medications will be authorized once you are discharged, as it is imperative that you return to your primary care physician (or establish a relationship with a primary care physician if you do not have one) for your aftercare needs so that they can reassess your need for medications and monitor your lab values.    Today   CHIEF COMPLAINT:   Chief Complaint  Patient presents with  . Shortness of Breath    HISTORY OF PRESENT ILLNESS:  Mckenzie Taylor  is a 77 y.o. female with a known history of diastolic CHF, COPD, chronic respiratory failure, hypertension, diabetes mellitus, morbid obesity presents to the hospital complaining of 2 weeks of progressively worsening shortness of breath, wheezing, orthopnea, lower extremity edema and 7 pound weight gain.  Patient bumped her  oxygen from 3 L to 4 L yesterday with no improvement and continued her nebulizers.  Here in the emergency room she has received IV steroids, 2 rounds of DuoNeb with no significant improvement and is being admitted to the hospital.  Chest x-ray shows mild pulmonary edema but no infiltrates.   VITAL SIGNS:  Blood pressure (!) 144/68, pulse 81, temperature 98.1 F (36.7 C), temperature source Oral, resp. rate 18, height 5\' 6"  (1.676 m), weight 106.9 kg, SpO2 98 %.  I/O:  No intake or output data in the 24 hours ending 01/13/18 1748  PHYSICAL EXAMINATION:  Constitutional: She is oriented to person, place, and time.  HENT:  Head: Normocephalic and atraumatic.  Eyes: Pupils are equal, round, and reactive to light. Conjunctivae and EOM are normal.  Neck: Normal range of motion. Neck supple. No tracheal deviation present. No thyromegaly present.  Cardiovascular: Normal rate, regular rhythm and normal heart sounds.  Pulmonary/Chest: Effort normal and breath sounds normal. No respiratory distress. She has no wheezes. She exhibits no tenderness.  On chronic oxygen Mckenzie home 3 to 4 L.  Abdominal: Soft. Bowel sounds are normal. She exhibits no distension. There is no tenderness.  Musculoskeletal: Normal range of motion.  Neurological: She is alert and oriented to person, place, and time. No cranial nerve deficit.  Skin: Skin is warm and dry. No rash noted.   DATA REVIEW:   CBC Recent Labs  Lab 01/10/18 0627  WBC 10.6*  HGB 9.0*  HCT 31.1*  PLT 293    Chemistries  Recent Labs  Lab 01/07/18 0224  01/10/18 0627  NA 137   < > 144  K 5.5*   < > 3.8  CL 98   < > 97*  CO2 29   < > 35*  GLUCOSE 368*   < > 169*  BUN 45*   < > 40*  CREATININE 2.42*   < > 1.62*  CALCIUM 8.3*   < > 8.5*  MG 1.9  --   --   AST 18  --   --   ALT 19  --   --   ALKPHOS 62  --   --   BILITOT 0.5  --   --    < > = values in this interval not displayed.    Cardiac Enzymes Recent Labs  Lab 01/07/18 0224   TROPONINI <0.03    Microbiology Results  Results for orders placed or performed  during the hospital encounter of 10/25/16  MRSA PCR Screening     Status: None   Collection Time: 10/25/16  5:31 PM  Result Value Ref Range Status   MRSA by PCR NEGATIVE NEGATIVE Final    Comment:        The GeneXpert MRSA Assay (FDA approved for NASAL specimens only), is one component of a comprehensive MRSA colonization surveillance program. It is not intended to diagnose MRSA infection nor to guide or monitor treatment for MRSA infections.     RADIOLOGY:  No results found.  EKG:   Orders placed or performed during the hospital encounter of 01/04/18  . EKG 12-Lead  . EKG 12-Lead  . EKG      Management plans discussed with the patient, family and they are in agreement.  CODE STATUS:  Code Status History    Date Active Date Inactive Code Status Order ID Comments User Context   01/04/2018 1347 01/10/2018 1449 Full Code [id number]  Hillary Bow, MD ED   01/07/2017 1624 01/08/2017 1240 DNR [id number]  Asencion Gowda, NP Inpatient   01/06/2017 1838 01/07/2017 1624 Partial Code [id number]  Vernell Morgans, RN Inpatient   01/06/2017 1548 01/06/2017 1838 Full Code [id number]  Bettey Costa, MD Inpatient   01/06/2017 1233 01/06/2017 1548 DNR [id number]  Bettey Costa, MD Inpatient   11/26/2016 1003 11/27/2016 1545 DNR [id number]  Hillary Bow, MD Inpatient   11/25/2016 2126 11/26/2016 1003 Full Code [id number]  Bettey Costa, MD Inpatient   10/25/2016 1614 10/29/2016 1725 Full Code [id number]  Henreitta Leber, MD ED   08/14/2016 2332 08/19/2016 1657 Full Code [id number]  Lance Coon, MD Inpatient   05/05/2016 0747 05/07/2016 1804 Full Code [id number]  Flora Lipps, MD ED   03/24/2016 1416 03/28/2016 1424 Full Code [id number]  Awilda Bill, NP ED      TOTAL TIME TAKING CARE OF THIS PATIENT: 35 minutes.    Mckenzie Taylor M.D on 01/13/2018 Mckenzie 5:48 PM  Between 7am to 6pm - Pager -  [phone]  After 6pm go to [email or web address] - password [email or web address] [facility]  Office  [phone]  CC: Primary care physician; Denton Lank, MD   Note: This dictation was prepared with Dragon dictation along with smaller phrase technology. Any transcriptional errors that result from this process are unintentional.

## 2018-01-19 ENCOUNTER — Ambulatory Visit: Payer: Medicare Other | Admitting: Family

## 2018-01-19 ENCOUNTER — Telehealth: Payer: Self-pay | Admitting: Family

## 2018-01-19 NOTE — Telephone Encounter (Signed)
Patient did not show for his [facility] Failure [facility] appointment on 01/19/18. Will attempt to reschedule.

## 2018-02-10 ENCOUNTER — Emergency Department: Payer: Medicare Other

## 2018-02-10 ENCOUNTER — Other Ambulatory Visit: Payer: Self-pay

## 2018-02-10 ENCOUNTER — Inpatient Hospital Stay
Admission: EM | Admit: 2018-02-10 | Discharge: 2018-02-12 | DRG: 603 | Disposition: A | Discharge status: Home / Self Care | Payer: Medicare Other | Attending: Internal Medicine | Admitting: Internal Medicine

## 2018-02-10 DIAGNOSIS — Z87891 Personal history of nicotine dependence: Secondary | ICD-10-CM

## 2018-02-10 DIAGNOSIS — E039 Hypothyroidism, unspecified: Secondary | ICD-10-CM | POA: Diagnosis present

## 2018-02-10 DIAGNOSIS — Z9049 Acquired absence of other specified parts of digestive tract: Secondary | ICD-10-CM | POA: Diagnosis not present

## 2018-02-10 DIAGNOSIS — Z79899 Other long term (current) drug therapy: Secondary | ICD-10-CM | POA: Diagnosis not present

## 2018-02-10 DIAGNOSIS — K219 Gastro-esophageal reflux disease without esophagitis: Secondary | ICD-10-CM | POA: Diagnosis present

## 2018-02-10 DIAGNOSIS — N183 Chronic kidney disease, stage 3 (moderate): Secondary | ICD-10-CM | POA: Diagnosis present

## 2018-02-10 DIAGNOSIS — Z801 Family history of malignant neoplasm of trachea, bronchus and lung: Secondary | ICD-10-CM

## 2018-02-10 DIAGNOSIS — D638 Anemia in other chronic diseases classified elsewhere: Secondary | ICD-10-CM | POA: Diagnosis present

## 2018-02-10 DIAGNOSIS — I5032 Chronic diastolic (congestive) heart failure: Secondary | ICD-10-CM | POA: Diagnosis present

## 2018-02-10 DIAGNOSIS — L0390 Cellulitis, unspecified: Secondary | ICD-10-CM

## 2018-02-10 DIAGNOSIS — I130 Hypertensive heart and chronic kidney disease with heart failure and stage 1 through stage 4 chronic kidney disease, or unspecified chronic kidney disease: Secondary | ICD-10-CM | POA: Diagnosis present

## 2018-02-10 DIAGNOSIS — Z90710 Acquired absence of both cervix and uterus: Secondary | ICD-10-CM | POA: Diagnosis not present

## 2018-02-10 DIAGNOSIS — Z794 Long term (current) use of insulin: Secondary | ICD-10-CM

## 2018-02-10 DIAGNOSIS — Z888 Allergy status to other drugs, medicaments and biological substances status: Secondary | ICD-10-CM | POA: Diagnosis not present

## 2018-02-10 DIAGNOSIS — I2510 Atherosclerotic heart disease of native coronary artery without angina pectoris: Secondary | ICD-10-CM | POA: Diagnosis present

## 2018-02-10 DIAGNOSIS — L03115 Cellulitis of right lower limb: Principal | ICD-10-CM | POA: Diagnosis present

## 2018-02-10 DIAGNOSIS — J449 Chronic obstructive pulmonary disease, unspecified: Secondary | ICD-10-CM | POA: Diagnosis present

## 2018-02-10 DIAGNOSIS — Z825 Family history of asthma and other chronic lower respiratory diseases: Secondary | ICD-10-CM | POA: Diagnosis not present

## 2018-02-10 DIAGNOSIS — E1122 Type 2 diabetes mellitus with diabetic chronic kidney disease: Secondary | ICD-10-CM | POA: Diagnosis present

## 2018-02-10 DIAGNOSIS — Z79891 Long term (current) use of opiate analgesic: Secondary | ICD-10-CM

## 2018-02-10 DIAGNOSIS — Z96643 Presence of artificial hip joint, bilateral: Secondary | ICD-10-CM | POA: Diagnosis present

## 2018-02-10 DIAGNOSIS — Z8249 Family history of ischemic heart disease and other diseases of the circulatory system: Secondary | ICD-10-CM

## 2018-02-10 DIAGNOSIS — Q613 Polycystic kidney, unspecified: Secondary | ICD-10-CM | POA: Diagnosis not present

## 2018-02-10 DIAGNOSIS — E538 Deficiency of other specified B group vitamins: Secondary | ICD-10-CM | POA: Diagnosis present

## 2018-02-10 DIAGNOSIS — Z9981 Dependence on supplemental oxygen: Secondary | ICD-10-CM

## 2018-02-10 DIAGNOSIS — M79604 Pain in right leg: Secondary | ICD-10-CM | POA: Diagnosis present

## 2018-02-10 DIAGNOSIS — Z7951 Long term (current) use of inhaled steroids: Secondary | ICD-10-CM

## 2018-02-10 DIAGNOSIS — J9611 Chronic respiratory failure with hypoxia: Secondary | ICD-10-CM | POA: Diagnosis present

## 2018-02-10 LAB — CBC WITH DIFFERENTIAL/PLATELET
Abs Immature Granulocytes: 0.67 10*3/uL — ABNORMAL HIGH (ref 0.00–0.07)
Basophils Absolute: 0.1 10*3/uL (ref 0.0–0.1)
Basophils Relative: 0 %
Eosinophils Absolute: 0 10*3/uL (ref 0.0–0.5)
Eosinophils Relative: 0 %
HCT: 29.7 % — ABNORMAL LOW (ref 36.0–46.0)
Hemoglobin: 8.6 g/dL — ABNORMAL LOW (ref 12.0–15.0)
Immature Granulocytes: 5 %
Lymphocytes Relative: 11 %
Lymphs Abs: 1.5 10*3/uL (ref 0.7–4.0)
MCH: 27.3 pg (ref 26.0–34.0)
MCHC: 29 g/dL — ABNORMAL LOW (ref 30.0–36.0)
MCV: 94.3 fL (ref 80.0–100.0)
Monocytes Absolute: 0.7 10*3/uL (ref 0.1–1.0)
Monocytes Relative: 5 %
Neutro Abs: 11.2 10*3/uL — ABNORMAL HIGH (ref 1.7–7.7)
Neutrophils Relative %: 79 %
Platelets: 350 10*3/uL (ref 150–400)
RBC: 3.15 MIL/uL — ABNORMAL LOW (ref 3.87–5.11)
RDW: 18.6 % — ABNORMAL HIGH (ref 11.5–15.5)
WBC: 14.3 10*3/uL — ABNORMAL HIGH (ref 4.0–10.5)
nRBC: 0.5 % — ABNORMAL HIGH (ref 0.0–0.2)

## 2018-02-10 LAB — BASIC METABOLIC PANEL
Anion gap: 10 (ref 5–15)
BUN: 18 mg/dL (ref 8–23)
CO2: 31 mmol/L (ref 22–32)
Calcium: 8.5 mg/dL — ABNORMAL LOW (ref 8.9–10.3)
Chloride: 98 mmol/L (ref 98–111)
Creatinine, Ser: 1.86 mg/dL — ABNORMAL HIGH (ref 0.44–1.00)
GFR calc Af Amer: 30 mL/min — ABNORMAL LOW (ref >60)
GFR calc non Af Amer: 26 mL/min — ABNORMAL LOW (ref >60)
Glucose, Bld: 199 mg/dL — ABNORMAL HIGH (ref 70–99)
Potassium: 3.5 mmol/L (ref 3.5–5.1)
Sodium: 139 mmol/L (ref 135–145)

## 2018-02-10 LAB — GLUCOSE, CAPILLARY: Glucose-Capillary: 184 mg/dL — ABNORMAL HIGH (ref 70–99)

## 2018-02-10 LAB — TROPONIN I: Troponin I: <0.03 ng/mL (ref <0.03)

## 2018-02-10 LAB — BRAIN NATRIURETIC PEPTIDE: B Natriuretic Peptide: 120 pg/mL — ABNORMAL HIGH (ref 0.0–100.0)

## 2018-02-10 MED ORDER — HEPARIN SODIUM (PORCINE) 5000 UNIT/ML IJ SOLN
5000.0000 [IU] | Freq: Three times a day (TID) | INTRAMUSCULAR | Status: DC
  Administered 2018-02-10 – 2018-02-12 (×6): 5000 [IU] via SUBCUTANEOUS
  Filled 2018-02-10 (×6): qty 1

## 2018-02-10 MED ORDER — CLINDAMYCIN PHOSPHATE 600 MG/50ML IV SOLN
600.0000 mg | Freq: Three times a day (TID) | INTRAVENOUS | Status: DC
  Administered 2018-02-11 – 2018-02-12 (×6): 600 mg via INTRAVENOUS
  Filled 2018-02-10 (×9): qty 50

## 2018-02-10 MED ORDER — SODIUM CHLORIDE 0.9 % IV SOLN
250.0000 mL | INTRAVENOUS | Status: DC | PRN
  Administered 2018-02-11: 250 mL via INTRAVENOUS

## 2018-02-10 MED ORDER — INSULIN ASPART 100 UNIT/ML ~~LOC~~ SOLN
0.0000 [IU] | Freq: Three times a day (TID) | SUBCUTANEOUS | Status: DC
  Administered 2018-02-11: 2 [IU] via SUBCUTANEOUS
  Administered 2018-02-11: 1 [IU] via SUBCUTANEOUS
  Administered 2018-02-11 – 2018-02-12 (×3): 2 [IU] via SUBCUTANEOUS
  Filled 2018-02-10 (×5): qty 1

## 2018-02-10 MED ORDER — FLUOXETINE HCL 20 MG PO CAPS
40.0000 mg | ORAL_CAPSULE | Freq: Every evening | ORAL | Status: DC
  Administered 2018-02-11: 40 mg via ORAL
  Filled 2018-02-10 (×3): qty 2

## 2018-02-10 MED ORDER — SODIUM CHLORIDE 0.9% FLUSH: 3.0000 mL | INTRAVENOUS | Status: DC | PRN

## 2018-02-10 MED ORDER — PANTOPRAZOLE SODIUM 40 MG PO TBEC
40.0000 mg | DELAYED_RELEASE_TABLET | Freq: Every day | ORAL | Status: DC
  Administered 2018-02-11 – 2018-02-12 (×2): 40 mg via ORAL
  Filled 2018-02-10 (×2): qty 1

## 2018-02-10 MED ORDER — INSULIN DETEMIR 100 UNIT/ML FLEXPEN: 32.0000 [IU] | PEN_INJECTOR | Freq: Every day | SUBCUTANEOUS | Status: DC

## 2018-02-10 MED ORDER — IPRATROPIUM-ALBUTEROL 0.5-2.5 (3) MG/3ML IN SOLN
3.0000 mL | Freq: Four times a day (QID) | RESPIRATORY_TRACT | Status: DC
  Administered 2018-02-10: 3 mL via RESPIRATORY_TRACT
  Filled 2018-02-10: qty 3

## 2018-02-10 MED ORDER — BISACODYL 5 MG PO TBEC: 5.0000 mg | DELAYED_RELEASE_TABLET | Freq: Every day | ORAL | Status: DC | PRN

## 2018-02-10 MED ORDER — TRAZODONE HCL 50 MG PO TABS
50.0000 mg | ORAL_TABLET | Freq: Every day | ORAL | Status: DC
  Administered 2018-02-10 – 2018-02-11 (×2): 50 mg via ORAL
  Filled 2018-02-10 (×2): qty 1

## 2018-02-10 MED ORDER — ALBUTEROL SULFATE (2.5 MG/3ML) 0.083% IN NEBU
2.5000 mg | INHALATION_SOLUTION | RESPIRATORY_TRACT | Status: DC | PRN
  Administered 2018-02-10: 2.5 mg via RESPIRATORY_TRACT
  Filled 2018-02-10: qty 3

## 2018-02-10 MED ORDER — VITAMIN D 25 MCG (1000 UNIT) PO TABS
5000.0000 [IU] | ORAL_TABLET | Freq: Every day | ORAL | Status: DC
  Administered 2018-02-11 – 2018-02-12 (×2): 5000 [IU] via ORAL
  Filled 2018-02-10 (×2): qty 5

## 2018-02-10 MED ORDER — HYDROCODONE-ACETAMINOPHEN 5-325 MG PO TABS
1.0000 | ORAL_TABLET | ORAL | Status: DC | PRN
  Administered 2018-02-11 – 2018-02-12 (×3): 2 via ORAL
  Filled 2018-02-10 (×3): qty 2

## 2018-02-10 MED ORDER — GABAPENTIN 100 MG PO CAPS
200.0000 mg | ORAL_CAPSULE | Freq: Three times a day (TID) | ORAL | Status: DC
  Administered 2018-02-10 – 2018-02-12 (×5): 200 mg via ORAL
  Filled 2018-02-10 (×5): qty 2

## 2018-02-10 MED ORDER — METOPROLOL SUCCINATE ER 25 MG PO TB24
25.0000 mg | ORAL_TABLET | Freq: Every day | ORAL | Status: DC
  Administered 2018-02-11 – 2018-02-12 (×2): 25 mg via ORAL
  Filled 2018-02-10 (×2): qty 1

## 2018-02-10 MED ORDER — VANCOMYCIN HCL IN DEXTROSE 1-5 GM/200ML-% IV SOLN
1000.0000 mg | Freq: Once | INTRAVENOUS | Status: AC
  Administered 2018-02-10: 1000 mg via INTRAVENOUS
  Filled 2018-02-10: qty 200

## 2018-02-10 MED ORDER — BUSPIRONE HCL 5 MG PO TABS
5.0000 mg | ORAL_TABLET | Freq: Two times a day (BID) | ORAL | Status: DC
  Administered 2018-02-10 – 2018-02-12 (×4): 5 mg via ORAL
  Filled 2018-02-10 (×5): qty 1

## 2018-02-10 MED ORDER — ONDANSETRON HCL 4 MG PO TABS: 4.0000 mg | ORAL_TABLET | Freq: Four times a day (QID) | ORAL | Status: DC | PRN

## 2018-02-10 MED ORDER — ACETAMINOPHEN 650 MG RE SUPP: 650.0000 mg | Freq: Four times a day (QID) | RECTAL | Status: DC | PRN

## 2018-02-10 MED ORDER — ONDANSETRON HCL 4 MG/2ML IJ SOLN
4.0000 mg | Freq: Four times a day (QID) | INTRAMUSCULAR | Status: DC | PRN
  Administered 2018-02-12: 4 mg via INTRAVENOUS
  Filled 2018-02-10: qty 2

## 2018-02-10 MED ORDER — TORSEMIDE 20 MG PO TABS
20.0000 mg | ORAL_TABLET | Freq: Every day | ORAL | Status: DC | PRN
  Administered 2018-02-11: 20 mg via ORAL
  Filled 2018-02-10 (×2): qty 1

## 2018-02-10 MED ORDER — ACETAMINOPHEN 325 MG PO TABS: 650.0000 mg | ORAL_TABLET | Freq: Four times a day (QID) | ORAL | Status: DC | PRN

## 2018-02-10 MED ORDER — METOLAZONE 2.5 MG PO TABS
2.5000 mg | ORAL_TABLET | Freq: Every day | ORAL | Status: DC
  Administered 2018-02-11: 2.5 mg via ORAL
  Filled 2018-02-10 (×2): qty 1

## 2018-02-10 MED ORDER — SENNOSIDES-DOCUSATE SODIUM 8.6-50 MG PO TABS: 1.0000 | ORAL_TABLET | Freq: Every evening | ORAL | Status: DC | PRN

## 2018-02-10 MED ORDER — TIOTROPIUM BROMIDE MONOHYDRATE 18 MCG IN CAPS
18.0000 ug | ORAL_CAPSULE | Freq: Every day | RESPIRATORY_TRACT | Status: DC
  Filled 2018-02-10: qty 5

## 2018-02-10 MED ORDER — IPRATROPIUM-ALBUTEROL 0.5-2.5 (3) MG/3ML IN SOLN
3.0000 mL | Freq: Once | RESPIRATORY_TRACT | Status: AC
  Administered 2018-02-10: 3 mL via RESPIRATORY_TRACT
  Filled 2018-02-10: qty 3

## 2018-02-10 MED ORDER — INSULIN DETEMIR 100 UNIT/ML ~~LOC~~ SOLN
32.0000 [IU] | Freq: Every day | SUBCUTANEOUS | Status: DC
  Administered 2018-02-10 – 2018-02-11 (×2): 32 [IU] via SUBCUTANEOUS
  Filled 2018-02-10 (×3): qty 0.32

## 2018-02-10 MED ORDER — IPRATROPIUM-ALBUTEROL 0.5-2.5 (3) MG/3ML IN SOLN
3.0000 mL | Freq: Once | RESPIRATORY_TRACT | Status: AC
  Administered 2018-02-10: 3 mL via RESPIRATORY_TRACT

## 2018-02-10 MED ORDER — ALPRAZOLAM 0.5 MG PO TABS
0.5000 mg | ORAL_TABLET | Freq: Once | ORAL | Status: AC
  Administered 2018-02-10: 0.5 mg via ORAL
  Filled 2018-02-10: qty 1

## 2018-02-10 MED ORDER — SODIUM CHLORIDE 0.9% FLUSH
3.0000 mL | Freq: Two times a day (BID) | INTRAVENOUS | Status: DC
  Administered 2018-02-10 – 2018-02-12 (×4): 3 mL via INTRAVENOUS

## 2018-02-10 MED ORDER — ATORVASTATIN CALCIUM 20 MG PO TABS
40.0000 mg | ORAL_TABLET | Freq: Every evening | ORAL | Status: DC
  Administered 2018-02-11: 40 mg via ORAL
  Filled 2018-02-10: qty 2

## 2018-02-10 MED ORDER — INSULIN ASPART 100 UNIT/ML ~~LOC~~ SOLN: 0.0000 [IU] | Freq: Every day | SUBCUTANEOUS | Status: DC

## 2018-02-10 MED ORDER — MOMETASONE FURO-FORMOTEROL FUM 200-5 MCG/ACT IN AERO
2.0000 | INHALATION_SPRAY | Freq: Two times a day (BID) | RESPIRATORY_TRACT | Status: DC
  Administered 2018-02-10 – 2018-02-12 (×4): 2 via RESPIRATORY_TRACT
  Filled 2018-02-10: qty 8.8

## 2018-02-10 MED ORDER — AMLODIPINE BESYLATE 10 MG PO TABS
10.0000 mg | ORAL_TABLET | Freq: Every day | ORAL | Status: DC
  Administered 2018-02-11 – 2018-02-12 (×2): 10 mg via ORAL
  Filled 2018-02-10 (×2): qty 1

## 2018-02-10 NOTE — Progress Notes (Addendum)
Advanced Care Plan.  Purpose of Encounter: CODE STATUS. Parties in Attendance: The patient and me. Patient's Decisional Capacity: Yes. Medical Story: Mckenzie Taylor  is a 77 y.o. female with a known history of multiple medical problems including chronic respiratory failure on home oxygen, chronic diastolic CHF, CAD, COPD, hypertension, diabetes, hypothyroidism, vitamin B 12 deficiency, avascular necrosis of hip CKD and polycystic kidneys.    The patient is being admitted for right leg cellulitis with leukocytosis.  I discussed with the patient about patient current condition, prognosis and CODE STATUS.  The patient said she was in DNR status but she changed mind and does want to be resuscitated and intubated if she has cardiopulmonary arrest. Plan:  Code Status: Full code. Time spent discussing advance care planning: 18 minutes.

## 2018-02-10 NOTE — H&P (Addendum)
Mckenzie Taylor NAME: Mckenzie Taylor    MR#:  [id number]  DATE OF BIRTH:  11/27/1940  DATE OF ADMISSION:  02/10/2018  PRIMARY CARE PHYSICIAN: Mckenzie Lank, MD   REQUESTING/REFERRING PHYSICIAN: Dr. Archie Balboa.  CHIEF COMPLAINT:   Chief Complaint  Patient presents with  . Leg Pain  . Shortness of Breath   Right leg swelling, pain and redness. HISTORY OF PRESENT ILLNESS:  Mckenzie Taylor  is a 77 y.o. female with a known history of multiple medical problems including chronic respiratory failure on home oxygen, chronic diastolic CHF, CAD, COPD, hypertension, diabetes, hypothyroidism, vitamin B 12 deficiency, avascular necrosis of hip CKD and polycystic kidneys.  The patient presented to ED with above chief complaints.  She has had right leg swelling and pain for 2 months.  She noticed worsening redness today, spreading from right foot toward right leg.  She also complains of fever, chills.  The patient is on home oxygen 3 to 4 L.  But she does not complains of shortness of breath or wheezing.  White count is elevated at 14.3.  Right leg ultrasound did not show any DVT.  Dr. Nyoka Cowden requested admission.  PAST MEDICAL HISTORY:   Past Medical History:  Diagnosis Date  . Autosomal recessive polycystic kidneys   . Avascular necrosis of hip (HCC)    bilateral  . CAD (coronary artery disease)    mild  . CHF (congestive heart failure) ([place])   . Chronic kidney disease    cyst  . COPD (chronic obstructive pulmonary disease) ([place])   . Diabetes type 2, controlled ([place])   . GERD (gastroesophageal reflux disease)   . HTN (hypertension)   . Hypothyroidism    subclinical. low TSH, normal thyroid panel. biopsy 2010  . Vitamin B12 deficiency     PAST SURGICAL HISTORY:   Past Surgical History:  Procedure Laterality Date  . CHOLECYSTECTOMY    . hip replacement     bilateral-secondary to avascular necrosis  . right eye lens replacement    .  ULNAR NERVE REPAIR     bilateral  . VESICOVAGINAL FISTULA CLOSURE W/ TAH      SOCIAL HISTORY:   Social History   Tobacco Use  . Smoking status: Former Smoker    Packs/day: 1.00    Years: 50.00    Pack years: 50.00  . Smokeless tobacco: Never Used  . Tobacco comment: 1 ppd - 50 years   Substance Use Topics  . Alcohol use: No    FAMILY HISTORY:   Family History  Problem Relation Age of Onset  . Cancer Father        lung  . COPD Mother   . Heart disease Mother   . Heart disease Maternal Uncle     DRUG ALLERGIES:   Allergies  Allergen Reactions  . Other Rash    Pt reports allergy to metals.    REVIEW OF SYSTEMS:   Review of Systems  Constitutional: Positive for chills, fever and malaise/fatigue.  HENT: Negative for sore throat.   Eyes: Negative for blurred vision and double vision.  Respiratory: Negative for cough, hemoptysis, shortness of breath, wheezing and stridor.   Cardiovascular: Negative for chest pain, palpitations, orthopnea and leg swelling.  Gastrointestinal: Negative for abdominal pain, blood in stool, diarrhea, melena, nausea and vomiting.  Genitourinary: Negative for dysuria, flank pain and hematuria.  Musculoskeletal: Negative for back pain and joint pain.       Right  leg swelling, pain and redness.  Neurological: Negative for dizziness, sensory change, focal weakness, seizures, loss of consciousness, weakness and headaches.  Endo/Heme/Allergies: Negative for polydipsia.  Psychiatric/Behavioral: Negative for depression. The patient is not nervous/anxious.     MEDICATIONS AT HOME:   Prior to Admission medications   Medication Sig Start Date End Date Taking? Authorizing Provider  albuterol (VENTOLIN HFA) 108 (90 BASE) MCG/ACT inhaler Inhale 2 puffs into the lungs every 4 (four) hours as needed.     [provider]  amLODipine (NORVASC) 10 MG tablet Take 10 mg by mouth daily.      [provider]  atorvastatin (LIPITOR) 40 MG  tablet Take 40 mg by mouth every evening.     [provider]  busPIRone (BUSPAR) 5 MG tablet Take 5 mg by mouth 2 (two) times daily.    [provider]  Cholecalciferol (VITAMIN D3) 5000 units CAPS Take 1 capsule by mouth daily.    [provider]  Cyanocobalamin 1500 MCG TBDP Take 3 tablets by mouth daily.    [provider]  FLUoxetine (PROZAC) 40 MG capsule Take 40 mg by mouth every evening.     [provider]  Fluticasone-Salmeterol (ADVAIR) 250-50 MCG/DOSE AEPB Inhale 1 puff into the lungs 2 (two) times daily.    [provider]  gabapentin (NEURONTIN) 100 MG capsule Take 200 mg by mouth 3 (three) times daily.    [provider]  ipratropium-albuterol (DUONEB) 0.5-2.5 (3) MG/3ML SOLN Take 3 mLs by nebulization 4 (four) times daily. 03/28/16   Vaughan Basta, MD  LEVEMIR FLEXTOUCH 100 UNIT/ML Pen Inject 32 Units into the skin at bedtime.     [provider]  metolazone (ZAROXOLYN) 2.5 MG tablet Take 2.5 mg by mouth daily.    [provider]  metoprolol succinate (TOPROL-XL) 25 MG 24 hr tablet Take 25 mg by mouth daily.    [provider]  NOVOLOG FLEXPEN 100 UNIT/ML FlexPen Inject 8 Units into the skin 3 (three) times daily as needed for high blood sugar. Sliding scale if blood sugar is above 200 - max 35u daily 04/02/16   [provider]  omeprazole (PRILOSEC) 20 MG capsule Take 20 mg by mouth daily.    [provider]  oxyCODONE-acetaminophen (PERCOCET) 10-325 MG tablet Take 1 tablet by mouth 3 (three) times daily as needed for pain.  12/08/17   [provider]  predniSONE (STERAPRED UNI-PAK 21 TAB) 10 MG (21) TBPK tablet Take 6 tabs first day, 5 tab on day 2, then 4 on day 3rd, 3 tabs on day 4th , 2 tab on day 5th, and 1 tab on 6th day. 01/10/18   Vaughan Basta, MD  promethazine (PHENERGAN) 25 MG tablet Take 25 mg by mouth every 6 (six) hours as needed for nausea.      [provider]  tiotropium (SPIRIVA) 18 MCG inhalation capsule Place 18 mcg into inhaler and inhale daily.    [provider]  tiZANidine (ZANAFLEX) 4 MG capsule Take 4 mg by mouth 3 (three) times daily as needed for muscle spasms.    [provider]  torsemide (DEMADEX) 10 MG tablet Take 2 tablets (20 mg total) by mouth daily as needed (leg swelling). 01/10/18   Vaughan Basta, MD  traZODone (DESYREL) 50 MG tablet Take 50 mg by mouth at bedtime. 08/10/16   [provider]      VITAL SIGNS:  Blood pressure 135/78, pulse (!) 116, temperature 98.5 F (36.9  C), temperature source Oral, resp. rate (!) 22, height 5\' 6"  (1.676 m), weight 98.4 kg, SpO2 99 %.  PHYSICAL EXAMINATION:  Physical Exam  GENERAL:  77 y.o.-year-old patient lying in the bed with no acute distress.  EYES: Pupils equal, round, reactive to light and accommodation. No scleral icterus. Extraocular muscles intact.  HEENT: Head atraumatic, normocephalic. Oropharynx and nasopharynx clear.  NECK:  Supple, no jugular venous distention. No thyroid enlargement, no tenderness.  LUNGS: Diminished breath sounds bilaterally, no wheezing, rales,rhonchi or crepitation. No use of accessory muscles of respiration.  CARDIOVASCULAR: S1, S2 normal. No murmurs, rubs, or gallops.  ABDOMEN: Soft, nontender, nondistended. Bowel sounds present. No organomegaly or mass.  Abdominal wall hernia.  A 2 cm healing ulcer on the left side of abdomen. EXTREMITIES: Bilateral leg edema 1+.  Tenderness and erythema on right foot spreading to right leg under knee  No cyanosis, or clubbing.  NEUROLOGIC: Cranial nerves II through XII are intact. Muscle strength 5/5 in all extremities. Sensation intact. Gait not checked.  PSYCHIATRIC: The patient is alert and oriented x 3.  SKIN: No obvious rash, lesion, or ulcer.   LABORATORY PANEL:   CBC Recent Labs  Lab 02/10/18 1705  WBC 14.3*  HGB 8.6*  HCT 29.7*  PLT 350    ------------------------------------------------------------------------------------------------------------------  Chemistries  Recent Labs  Lab 02/10/18 1705  NA 139  K 3.5  CL 98  CO2 31  GLUCOSE 199*  BUN 18  CREATININE 1.86*  CALCIUM 8.5*   ------------------------------------------------------------------------------------------------------------------  Cardiac Enzymes Recent Labs  Lab 02/10/18 1705  TROPONINI <0.03   ------------------------------------------------------------------------------------------------------------------  RADIOLOGY:  Dg Chest 2 View  Result Date: 02/10/2018 CLINICAL DATA:  Shortness of breath EXAM: CHEST - 2 VIEW COMPARISON:  01/07/2018 FINDINGS: Moderate cardiomegaly. There is increased bibasilar atelectasis compared to the prior study. There is mild interstitial prominence but no overt pulmonary edema. No pleural effusion or pneumothorax. IMPRESSION: 1. Cardiomegaly without overt pulmonary edema. 2. Increased bibasilar atelectasis. Electronically Signed   By: Ulyses Jarred M.D.   On: 02/10/2018 18:01   US Venous Img Lower Unilateral Right  Result Date: 02/10/2018 CLINICAL DATA:  Right lower extremity pain and edema EXAM: Right LOWER EXTREMITY VENOUS DOPPLER ULTRASOUND TECHNIQUE: Gray-scale sonography with graded compression, as well as color Doppler and duplex ultrasound were performed to evaluate the lower extremity deep venous systems from the level of the common femoral vein and including the common femoral, femoral, profunda femoral, popliteal and calf veins including the posterior tibial, peroneal and gastrocnemius veins when visible. The superficial great saphenous vein was also interrogated. Spectral Doppler was utilized to evaluate flow at rest and with distal augmentation maneuvers in the common femoral, femoral and popliteal veins. COMPARISON:  Radiograph 01/25/2005 FINDINGS: Contralateral Common Femoral Vein: Respiratory phasicity is  normal and symmetric with the symptomatic side. No evidence of thrombus. Normal compressibility. Common Femoral Vein: No evidence of thrombus. Patient unable to tolerate compression. Normal respiratory phasicity and response to augmentation. Saphenofemoral Junction: No evidence of thrombus. Patient unable to tolerate compression. Normal flow on color Doppler imaging. Profunda Femoral Vein: No evidence of thrombus. Patient unable to tolerate compression, normal flow on color Doppler imaging. Femoral Vein: No evidence of thrombus. Patient unable to tolerate compression, normal respiratory phasicity and response to augmentation. Popliteal Vein: No evidence of thrombus. Normal compressibility, respiratory phasicity and response to augmentation. Calf Veins: No evidence of thrombus. Normal compressibility and flow on color Doppler imaging. Other Findings:  Popliteal fossa cyst measuring 5 x 1.4  x 3.6 cm IMPRESSION: 1. Slightly limited study as the patient was unable to tolerate compression of the deep venous system above the knee. Normal color flow observed on Doppler and no definitive acute DVT is seen. 2. 5 cm popliteal fossa cyst Electronically Signed   By: Donavan Foil M.D.   On: 02/10/2018 19:54      IMPRESSION AND PLAN:   Right leg cellulitis with leukocytosis. The patient will be admitted to medical floor. The patient is treated with vancomycin in the ED.  I will start clindamycin IV every 8 hours.  Follow-up CBC.  Chronic respiratory failure due to COPD and CHF. Continue home oxygen 3 to 4 L, nebulizer PRN, continue Spiriva and Dulera.  Chronic diastolic CHF ejection fraction 45 to 50%.  Continue torsemide. Diabetes.  Continue home Levemir and start sliding scale. Hypertension.  Continue hypertension medication. Anemia of chronic disease.  Stable.  All the records are reviewed and case discussed with ED provider. Management plans discussed with the patient, family and they are in  agreement.  CODE STATUS: Full code.  TOTAL TIME TAKING CARE OF THIS PATIENT: 36 minutes.    Demetrios Loll M.D on 02/10/2018 at 8:53 PM  Between 7am to 6pm - Pager - [phone]  After 6pm go to [email or web address] - Proofreader  [facility]  Office  [phone]  CC: Primary care physician; Mckenzie Lank, MD   Note: This dictation was prepared with Dragon dictation along with smaller phrase technology. Any transcriptional errors that result from this process are unin

## 2018-02-10 NOTE — ED Provider Notes (Signed)
[facility] Emergency Department Provider Note _______________________________________   I have reviewed the triage vital signs and the nursing notes.   HISTORY  Chief Complaint Right leg pain/swelling  History limited by: Not Limited   HPI Mckenzie Taylor is a 77 y.o. female who presents to the emergency department today with primary concern of right leg pain and swelling. These symptoms have been present for the past two months. She has noticed some redness to that area of her leg with the pain. The patient also has concern for a wound on her stomach that has been there for roughly 2 months. The patient has been taking antibiotics for this wound. She also has been having shortness of breath. States she does have a history of COPD.    Per medical record review patient has a history of CHF, COPD  Past Medical History:  Diagnosis Date  . Autosomal recessive polycystic kidneys   . Avascular necrosis of hip (HCC)    bilateral  . CAD (coronary artery disease)    mild  . CHF (congestive heart failure) ([place])   . Chronic kidney disease    cyst  . COPD (chronic obstructive pulmonary disease) ([place])   . Diabetes type 2, controlled ([place])   . GERD (gastroesophageal reflux disease)   . HTN (hypertension)   . Hypothyroidism    subclinical. low TSH, normal thyroid panel. biopsy 2010  . Vitamin B12 deficiency     Patient Active Problem List   Diagnosis Date Noted  . COPD exacerbation ([place]) 01/04/2018  . CHF (congestive heart failure) ([place]) 01/06/2017  . COPD (chronic obstructive pulmonary disease) ([place]) 11/25/2016  . Nausea and vomiting 10/29/2016  . Acute on chronic congestive heart failure ([place])   . Acute pulmonary edema (HCC)   . Acute respiratory failure with hypoxia ([place]) 10/25/2016  . COPD with acute exacerbation ([place]) 08/14/2016  . CAP (community acquired pneumonia) 08/14/2016  . Acute systolic CHF (congestive heart failure) ([place]) 08/14/2016  .  Accelerated hypertension 08/14/2016  . GERD (gastroesophageal reflux disease) 08/14/2016  . Diabetes ([place]) 08/14/2016  . Respiratory distress 05/05/2016  . Acute respiratory failure ([place]) 03/24/2016  . 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 TOBACCO ABUSE 12/17/2008  . Coronary atherosclerosis of native coronary artery 12/17/2008  . 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 CLAUDICATION, INTERMITTENT 12/17/2008    Past Surgical History:  Procedure Laterality Date  . CHOLECYSTECTOMY    . hip replacement     bilateral-secondary to avascular necrosis  . right eye lens replacement    . ULNAR NERVE REPAIR     bilateral  . VESICOVAGINAL FISTULA CLOSURE W/ TAH      Prior to Admission medications   Medication Sig Start Date End Date Taking? Authorizing Provider  albuterol (VENTOLIN HFA) 108 (90 BASE) MCG/ACT inhaler Inhale 2 puffs into the lungs every 4 (four) hours as needed.     [provider]  amLODipine (NORVASC) 10 MG tablet Take 10 mg by mouth daily.      [provider]  atorvastatin (LIPITOR) 40 MG tablet Take 40 mg by mouth every evening.     [provider]  busPIRone (BUSPAR) 5 MG tablet Take 5 mg by mouth 2 (two) times daily.    [provider]  Cholecalciferol (VITAMIN D3) 5000 units CAPS Take 1 capsule by mouth daily.    [provider]  Cyanocobalamin 1500 MCG TBDP Take 3 tablets by mouth daily.    [provider]  FLUoxetine (PROZAC) 40 MG capsule Take 40 mg by mouth every evening.     [provider]  Fluticasone-Salmeterol (ADVAIR) 250-50 MCG/DOSE AEPB Inhale 1 puff into the lungs 2 (two) times daily.    [provider]  gabapentin (NEURONTIN) 100 MG capsule Take 200 mg by mouth 3 (three) times daily.    [provider]  ipratropium-albuterol (DUONEB) 0.5-2.5 (3) MG/3ML SOLN Take 3 mLs by nebulization 4 (four) times daily. 03/28/16   Vaughan Basta, MD   LEVEMIR FLEXTOUCH 100 UNIT/ML Pen Inject 32 Units into the skin at bedtime.     [provider]  metolazone (ZAROXOLYN) 2.5 MG tablet Take 2.5 mg by mouth daily.    [provider]  metoprolol succinate (TOPROL-XL) 25 MG 24 hr tablet Take 25 mg by mouth daily.    [provider]  NOVOLOG FLEXPEN 100 UNIT/ML FlexPen Inject 8 Units into the skin 3 (three) times daily as needed for high blood sugar. Sliding scale if blood sugar is above 200 - max 35u daily 04/02/16   [provider]  omeprazole (PRILOSEC) 20 MG capsule Take 20 mg by mouth daily.    [provider]  oxyCODONE-acetaminophen (PERCOCET) 10-325 MG tablet Take 1 tablet by mouth 3 (three) times daily as needed for pain.  12/08/17   [provider]  predniSONE (STERAPRED UNI-PAK 21 TAB) 10 MG (21) TBPK tablet Take 6 tabs first day, 5 tab on day 2, then 4 on day 3rd, 3 tabs on day 4th , 2 tab on day 5th, and 1 tab on 6th day. 01/10/18   Vaughan Basta, MD  promethazine (PHENERGAN) 25 MG tablet Take 25 mg by mouth every 6 (six) hours as needed for nausea.     [provider]  tiotropium (SPIRIVA) 18 MCG inhalation capsule Place 18 mcg into inhaler and inhale daily.    [provider]  tiZANidine (ZANAFLEX) 4 MG capsule Take 4 mg by mouth 3 (three) times daily as needed for muscle spasms.    [provider]  torsemide (DEMADEX) 10 MG tablet Take 2 tablets (20 mg total) by mouth daily as needed (leg swelling). 01/10/18   Vaughan Basta, MD  traZODone (DESYREL) 50 MG tablet Take 50 mg by mouth at bedtime. 08/10/16   [provider]    Allergies Other  Family History  Problem Relation Age of Onset  . Cancer Father        lung  . COPD Mother   . Heart disease Mother   . Heart disease Maternal Uncle     Social History Social History   Tobacco Use  . Smoking status: Former Smoker    Packs/day: 1.00    Years: 50.00    Pack years:  50.00  . Smokeless tobacco: Never Used  . Tobacco comment: 1 ppd - 50 years   Substance Use Topics  . Alcohol use: No  . Drug use: No    Review of Systems Constitutional: No fever/chills Eyes: No visual changes. ENT: No sore throat. Cardiovascular: Denies chest pain. Respiratory: Positive for shortness of breath. Gastrointestinal: No abdominal pain.  No nausea, no vomiting.  No diarrhea.   Genitourinary: Negative for dysuria. Musculoskeletal: Positive for right leg swelling and pain.  Skin: Positive for redness to right lower leg. Positive for wound to her abdomen.  Neurological: Negative for headaches, focal weakness or numbness.  ____________________________________________   PHYSICAL EXAM:  VITAL SIGNS: ED Triage Vitals  Enc Vitals Group  BP 02/10/18 1700 (!) 141/107     Pulse Rate 02/10/18 1700 (!) 115     Resp 02/10/18 1700 20     Temp 02/10/18 1700 98.5 F (36.9 C)     Temp Source 02/10/18 1700 Oral     SpO2 02/10/18 1700 96 %     Weight 02/10/18 1701 217 lb (98.4 kg)     Height 02/10/18 1701 5\' 6"  (1.676 m)     Head Circumference --      Peak Flow --      Pain Score 02/10/18 1700 0   Constitutional: Alert and oriented.  Eyes: Conjunctivae are normal.  ENT      Head: Normocephalic and atraumatic.      Nose: No congestion/rhinnorhea.      Mouth/Throat: Mucous membranes are moist.      Neck: No stridor. Hematological/Lymphatic/Immunilogical: No cervical lymphadenopathy. Cardiovascular: Tachycardic, regular rhythm.  No murmurs, rubs, or gallops.  Respiratory: Increased respiratory effort, expiratory wheezing.  Gastrointestinal: Soft and non tender. No rebound. No guarding.  Genitourinary: Deferred Musculoskeletal: Normal range of motion in all extremities. Swelling to right lower leg.  Neurologic:  Normal speech and language. No gross focal neurologic deficits are appreciated.  Skin:  Erythema to the right lower leg, primarily the dorsal surface. Roughly 2  cm scab/wound to her left abdomen.  Psychiatric: Mood and affect are normal. Speech and behavior are normal. Patient exhibits appropriate insight and judgment.  ____________________________________________    LABS (pertinent positives/negatives)  CBC wbc 14.3, hgb 8.6, plt 350 Trop <0.03 BNP 120.0 BMP na 139, k 3.5, glu 199, cr 1.86 ____________________________________________   EKG  I, Nance Pear, attending physician, personally viewed and interpreted this EKG  EKG Time: 1658 Rate: 115 Rhythm: sinus tachycardia Axis: normal Intervals: qtc 494 QRS: narrow, q waves v1 ST changes: no st elevation Impression: abnormal ekg  ____________________________________________    RADIOLOGY  CXR  Cardiomegaly  US venous right No acute DVT  ____________________________________________   PROCEDURES  Procedures  ____________________________________________   INITIAL IMPRESSION / ASSESSMENT AND PLAN / ED COURSE  Pertinent labs & imaging results that were available during my care of the patient were reviewed by me and considered in my medical decision making (see chart for details).   Patient presented to the emergency department today because of concerns for right lower examinee swelling pain and erythema.  Did have concern for possible blood clots and ultrasound was performed.  This did not show any evidence of DVT.  Do think this could likely represent cellulitis and that could explain patient's elevated white blood cell count.  She is  ____________________________________________   FINAL CLINICAL IMPRESSION(S) / ED DIAGNOSES  Final diagnoses:  Cellulitis, unspecified cellulitis site     Note: This dictation was prepared with Dragon dictation. Any transcriptional errors that result from this process are unintentional     Nance Pear, MD 02/10/18 2029

## 2018-02-10 NOTE — ED Notes (Signed)
Patient arrives via EMS with c/o right leg redness that started 1 week ago. Patient also reports shortness of breath but states it is her usual. Hx COPD, on 3LNC. Patient currently on antibiotics for wound to left abdomen, no redness or drainage noted. 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  20g inserted into left ac by EMS.

## 2018-02-11 ENCOUNTER — Inpatient Hospital Stay: Payer: Medicare Other

## 2018-02-11 LAB — CBC
HCT: 28.2 % — ABNORMAL LOW (ref 36.0–46.0)
Hemoglobin: 8.2 g/dL — ABNORMAL LOW (ref 12.0–15.0)
MCH: 27.6 pg (ref 26.0–34.0)
MCHC: 29.1 g/dL — ABNORMAL LOW (ref 30.0–36.0)
MCV: 94.9 fL (ref 80.0–100.0)
Platelets: 284 10*3/uL (ref 150–400)
RBC: 2.97 MIL/uL — ABNORMAL LOW (ref 3.87–5.11)
RDW: 18.5 % — ABNORMAL HIGH (ref 11.5–15.5)
WBC: 13.1 10*3/uL — ABNORMAL HIGH (ref 4.0–10.5)
nRBC: 0.3 % — ABNORMAL HIGH (ref 0.0–0.2)

## 2018-02-11 LAB — GLUCOSE, CAPILLARY
Glucose-Capillary: 182 mg/dL — ABNORMAL HIGH (ref 70–99)
Glucose-Capillary: 174 mg/dL — ABNORMAL HIGH (ref 70–99)
Glucose-Capillary: 157 mg/dL — ABNORMAL HIGH (ref 70–99)
Glucose-Capillary: 179 mg/dL — ABNORMAL HIGH (ref 70–99)

## 2018-02-11 LAB — BASIC METABOLIC PANEL
Anion gap: 8 (ref 5–15)
BUN: 17 mg/dL (ref 8–23)
CO2: 32 mmol/L (ref 22–32)
Calcium: 8.4 mg/dL — ABNORMAL LOW (ref 8.9–10.3)
Chloride: 100 mmol/L (ref 98–111)
Creatinine, Ser: 1.6 mg/dL — ABNORMAL HIGH (ref 0.44–1.00)
GFR calc Af Amer: 36 mL/min — ABNORMAL LOW (ref >60)
GFR calc non Af Amer: 31 mL/min — ABNORMAL LOW (ref >60)
Glucose, Bld: 237 mg/dL — ABNORMAL HIGH (ref 70–99)
Potassium: 3.9 mmol/L (ref 3.5–5.1)
Sodium: 140 mmol/L (ref 135–145)

## 2018-02-11 MED ORDER — IPRATROPIUM-ALBUTEROL 0.5-2.5 (3) MG/3ML IN SOLN
3.0000 mL | Freq: Four times a day (QID) | RESPIRATORY_TRACT | Status: DC | PRN
  Administered 2018-02-11 – 2018-02-12 (×3): 3 mL via RESPIRATORY_TRACT
  Filled 2018-02-11 (×3): qty 3

## 2018-02-11 MED ORDER — TECHNETIUM TO 99M ALBUMIN AGGREGATED
4.9200 | Freq: Once | INTRAVENOUS | Status: AC | PRN
  Administered 2018-02-11: 4.92 via INTRAVENOUS

## 2018-02-11 MED ORDER — IPRATROPIUM-ALBUTEROL 0.5-2.5 (3) MG/3ML IN SOLN
3.0000 mL | Freq: Four times a day (QID) | RESPIRATORY_TRACT | Status: DC
  Administered 2018-02-11: 3 mL via RESPIRATORY_TRACT
  Filled 2018-02-11: qty 3

## 2018-02-11 MED ORDER — UMECLIDINIUM BROMIDE 62.5 MCG/INH IN AEPB
1.0000 | INHALATION_SPRAY | Freq: Every day | RESPIRATORY_TRACT | Status: DC
  Administered 2018-02-11 – 2018-02-12 (×2): 1 via RESPIRATORY_TRACT
  Filled 2018-02-11: qty 7

## 2018-02-11 MED ORDER — LATANOPROST 0.005 % OP SOLN
1.0000 [drp] | Freq: Every day | OPHTHALMIC | Status: DC
  Administered 2018-02-11: 1 [drp] via OPHTHALMIC
  Filled 2018-02-11: qty 2.5

## 2018-02-11 MED ORDER — TORSEMIDE 20 MG PO TABS
40.0000 mg | ORAL_TABLET | Freq: Every day | ORAL | Status: DC | PRN
  Filled 2018-02-11: qty 2

## 2018-02-11 MED ORDER — FLUTICASONE-UMECLIDIN-VILANT 100-62.5-25 MCG/INH IN AEPB: 1.0000 | INHALATION_SPRAY | Freq: Every day | RESPIRATORY_TRACT | Status: DC

## 2018-02-11 MED ORDER — METOLAZONE 2.5 MG PO TABS: 2.5000 mg | ORAL_TABLET | ORAL | Status: DC

## 2018-02-11 MED ORDER — TECHNETIUM TC 99M DIETHYLENETRIAME-PENTAACETIC ACID
32.6800 | Freq: Once | INTRAVENOUS | Status: AC | PRN
  Administered 2018-02-11: 32.68 via RESPIRATORY_TRACT

## 2018-02-11 MED ORDER — FLUTICASONE FUROATE-VILANTEROL 100-25 MCG/INH IN AEPB
1.0000 | INHALATION_SPRAY | Freq: Every day | RESPIRATORY_TRACT | Status: DC
  Administered 2018-02-11 – 2018-02-12 (×2): 1 via RESPIRATORY_TRACT
  Filled 2018-02-11: qty 28

## 2018-02-11 NOTE — Progress Notes (Signed)
[facility] [place] NAME: Mckenzie Taylor    MR#:  [id number]  DATE OF BIRTH:  08-03-1940  SUBJECTIVE:  CHIEF COMPLAINT:   Chief Complaint  Patient presents with  . Leg Pain  . Shortness of Breath   Right foot pain and redness.  REVIEW OF SYSTEMS:    Review of Systems  Constitutional: Positive for malaise/fatigue. Negative for chills and fever.  HENT: Negative for sore throat.   Eyes: Negative for blurred vision, double vision and pain.  Respiratory: Positive for shortness of breath (chronic). Negative for cough, hemoptysis and wheezing.   Cardiovascular: Negative for chest pain, palpitations, orthopnea and leg swelling.  Gastrointestinal: Negative for abdominal pain, constipation, diarrhea, heartburn, nausea and vomiting.  Genitourinary: Negative for dysuria and hematuria.  Musculoskeletal: Negative for back pain and joint pain.  Skin: Negative for rash.  Neurological: Negative for sensory change, speech change, focal weakness and headaches.  Endo/Heme/Allergies: Does not bruise/bleed easily.  Psychiatric/Behavioral: Negative for depression. The patient is nervous/anxious.    DRUG ALLERGIES:   Allergies  Allergen Reactions  . Other Rash    Pt reports allergy to metals.    VITALS:  Blood pressure 123/65, pulse (!) 112, temperature 97.9 F (36.6 C), temperature source Oral, resp. rate (!) 24, height 5\' 6"  (1.676 m), weight 104.2 kg, SpO2 97 %.  PHYSICAL EXAMINATION:   Physical Exam  GENERAL:  77 y.o.-year-old patient lying in the bed with no acute distress. Obese EYES: Pupils equal, round, reactive to light and accommodation. No scleral icterus. Extraocular muscles intact.  HEENT: Head atraumatic, normocephalic. Oropharynx and nasopharynx clear.  NECK:  Supple, no jugular venous distention. No thyroid enlargement, no tenderness.  LUNGS: Normal breath sounds bilaterally, no wheezing, rales, rhonchi. No use of accessory  muscles of respiration.  CARDIOVASCULAR: S1, S2 normal. No murmurs, rubs, or gallops.  ABDOMEN: Soft, nontender, nondistended. Bowel sounds present. No organomegaly or mass.  EXTREMITIES: No cyanosis, clubbing or edema b/l.    NEUROLOGIC: Cranial nerves II through XII are intact. No focal Motor or sensory deficits b/l.   PSYCHIATRIC: The patient is alert and oriented x 3.  SKIN: Redness lower leg and foot  LABORATORY PANEL:   CBC Recent Labs  Lab 02/11/18 0514  WBC 13.1*  HGB 8.2*  HCT 28.2*  PLT 284   ------------------------------------------------------------------------------------------------------------------ Chemistries  Recent Labs  Lab 02/11/18 0514  NA 140  K 3.9  CL 100  CO2 32  GLUCOSE 237*  BUN 17  CREATININE 1.60*  CALCIUM 8.4*   ------------------------------------------------------------------------------------------------------------------  Cardiac Enzymes Recent Labs  Lab 02/10/18 1705  TROPONINI <0.03   ------------------------------------------------------------------------------------------------------------------  RADIOLOGY:  Dg Chest 2 View  Result Date: 02/10/2018 CLINICAL DATA:  Shortness of breath EXAM: CHEST - 2 VIEW COMPARISON:  01/07/2018 FINDINGS: Moderate cardiomegaly. There is increased bibasilar atelectasis compared to the prior study. There is mild interstitial prominence but no overt pulmonary edema. No pleural effusion or pneumothorax. IMPRESSION: 1. Cardiomegaly without overt pulmonary edema. 2. Increased bibasilar atelectasis. Electronically Signed   By: Ulyses Jarred M.D.   On: 02/10/2018 18:01   Nm Pulmonary Perf And Vent  Result Date: 02/11/2018 CLINICAL DATA:  Pulmonary embolism suspected.  Shortness of breath EXAM: NUCLEAR MEDICINE VENTILATION - PERFUSION LUNG SCAN TECHNIQUE: Ventilation images were obtained in multiple projections using inhaled aerosol Tc-26m DTPA. Perfusion images were obtained in multiple projections  after intravenous injection of Tc-14m MAA. RADIOPHARMACEUTICALS:  32.6 mCi of Tc-75m DTPA aerosol inhalation and 4.92  mCi Tc68m MAA IV COMPARISON:  Chest radiograph 02/10/2018. FINDINGS: Ventilation: On the ventilation images there is heterogeneous distribution of the radiopharmaceutical to both lungs. Medium size foci of increased uptake within the central airways noted reflecting turbulent airflow secondary to COPD. Perfusion: No peripheral wedge-shaped perfusion abnormalities identified to suggest acute pulmonary emboli. On the oblique and lateral images there is large central area of relative photopenia secondary to enlarged cardiac silhouette. IMPRESSION: 1. Low probability for acute pulmonary emboli. 2. COPD. Electronically Signed   By: Kerby Moors M.D.   On: 02/11/2018 10:26   US Venous Img Lower Unilateral Right  Result Date: 02/10/2018 CLINICAL DATA:  Right lower extremity pain and edema EXAM: Right LOWER EXTREMITY VENOUS DOPPLER ULTRASOUND TECHNIQUE: Gray-scale sonography with graded compression, as well as color Doppler and duplex ultrasound were performed to evaluate the lower extremity deep venous systems from the level of the common femoral vein and including the common femoral, femoral, profunda femoral, popliteal and calf veins including the posterior tibial, peroneal and gastrocnemius veins when visible. The superficial great saphenous vein was also interrogated. Spectral Doppler was utilized to evaluate flow at rest and with distal augmentation maneuvers in the common femoral, femoral and popliteal veins. COMPARISON:  Radiograph 01/25/2005 FINDINGS: Contralateral Common Femoral Vein: Respiratory phasicity is normal and symmetric with the symptomatic side. No evidence of thrombus. Normal compressibility. Common Femoral Vein: No evidence of thrombus. Patient unable to tolerate compression. Normal respiratory phasicity and response to augmentation. Saphenofemoral Junction: No evidence of  thrombus. Patient unable to tolerate compression. Normal flow on color Doppler imaging. Profunda Femoral Vein: No evidence of thrombus. Patient unable to tolerate compression, normal flow on color Doppler imaging. Femoral Vein: No evidence of thrombus. Patient unable to tolerate compression, normal respiratory phasicity and response to augmentation. Popliteal Vein: No evidence of thrombus. Normal compressibility, respiratory phasicity and response to augmentation. Calf Veins: No evidence of thrombus. Normal compressibility and flow on color Doppler imaging. Other Findings:  Popliteal fossa cyst measuring 5 x 1.4 x 3.6 cm IMPRESSION: 1. Slightly limited study as the patient was unable to tolerate compression of the deep venous system above the knee. Normal color flow observed on Doppler and no definitive acute DVT is seen. 2. 5 cm popliteal fossa cyst Electronically Signed   By: Donavan Foil M.D.   On: 02/10/2018 19:54     ASSESSMENT AND PLAN:   * Right foot cellulitis with leukocytosis. She has chronic redness of her right leg.  Presently has warmth and redness of right foot. Continue IV antibiotics.  * Chronic respiratory failure due to COPD and CHF. Continue home oxygen 3 to 4 L Nebulizer PRN, continue Spiriva and Dulera.  * Chronic diastolic CHF ejection fraction 45 to 50%.  Continue torsemide and metolazone  * Diabetes.  Continue home Levemir and start sliding scale.  * Hypertension.  Continue hypertension medication.  * Anemia of chronic disease. Stable.  All the records are reviewed and case discussed with Care Management/Social Workerr. Management plans discussed with the patient, family and they are in agreement.  CODE STATUS: FULL CODE  DVT Prophylaxis: SCDs  TOTAL TIME TAKING CARE OF THIS PATIENT: 30 minutes.   POSSIBLE D/C IN 1-2 DAYS, DEPENDING ON CLINICAL CONDITION.  Leia Alf Dalasia Predmore M.D on 02/11/2018 at 1:03 PM  Between 7am to 6pm - Pager - [phone]  After  6pm go to [email or web address] - password [email or web address] [facility]  Office  [phone]  CC: Primary care physician; Denton Lank,  MD  Note: This dictation was prepared with Dragon dictation along with smaller phrase technology. Any transcriptional errors that result from this process are unintentional.

## 2018-02-12 ENCOUNTER — Encounter: Payer: Self-pay | Admitting: *Deleted

## 2018-02-12 LAB — GLUCOSE, CAPILLARY
Glucose-Capillary: 157 mg/dL — ABNORMAL HIGH (ref 70–99)
Glucose-Capillary: 157 mg/dL — ABNORMAL HIGH (ref 70–99)

## 2018-02-12 MED ORDER — CLINDAMYCIN HCL 300 MG PO CAPS: 300.0000 mg | ORAL_CAPSULE | Freq: Three times a day (TID) | ORAL | 0 refills | Status: AC

## 2018-02-12 NOTE — Discharge Instructions (Signed)
Resume diet and activity as before ° ° °

## 2018-02-16 ENCOUNTER — Telehealth: Payer: Self-pay

## 2018-02-16 NOTE — Telephone Encounter (Signed)
Flagged on EMMI report for having questions about discharge papers and unfilled prescriptions.  Called and spoke with patient.  She reports she was able to fill her medications and did not have any questions concerning her discharge papers at this time.  No other questions or issues noted.  I thanked her for her time and informed her she would receive one more automated call checking in over the next few days.

## 2018-02-21 NOTE — Discharge Summary (Signed)
[name] NAME: Mckenzie Taylor    MR#:  [id number]  DATE OF BIRTH:  [birth year]  DATE OF ADMISSION:  02/10/2018 ADMITTING PHYSICIAN: Demetrios Loll, MD  DATE OF DISCHARGE: 02/12/2018  6:36 PM  PRIMARY CARE PHYSICIAN: Denton Lank, MD   ADMISSION DIAGNOSIS:  Cellulitis, unspecified cellulitis site [L03.90]  DISCHARGE DIAGNOSIS:  Active Problems:   Cellulitis of right leg   SECONDARY DIAGNOSIS:   Past Medical History:  Diagnosis Date  . Autosomal recessive polycystic kidneys   . Avascular necrosis of hip (HCC)    bilateral  . CAD (coronary artery disease)    mild  . CHF (congestive heart failure) ([place])   . Chronic kidney disease    cyst  . COPD (chronic obstructive pulmonary disease) ([place])   . Diabetes type 2, controlled ([place])   . GERD (gastroesophageal reflux disease)   . HTN (hypertension)   . Hypothyroidism    subclinical. low TSH, normal thyroid panel. biopsy 2010  . Vitamin B12 deficiency      ADMITTING HISTORY  Mckenzie Taylor  is a 77 y.o. female with a known history of multiple medical problems including chronic respiratory failure on home oxygen, chronic diastolic CHF, CAD, COPD, hypertension, diabetes, hypothyroidism, vitamin B 12 deficiency, avascular necrosis of hip CKD and polycystic kidneys.  The patient presented to ED with above chief complaints.  She has had right leg swelling and pain for 2 months.  She noticed worsening redness today, spreading from right foot toward right leg.  She also complains of fever, chills.  The patient is on home oxygen 3 to 4 L.  But she does not complains of shortness of breath or wheezing.  White count is elevated at 14.3.  Right leg ultrasound did not show any DVT.  Dr. Nyoka Cowden requested admission.  HOSPITAL COURSE:   *Right foot cellulitis with significant leukocytosis *Chronic respiratory failure due to COPD and CHF *Chronic diastolic congestive heart failure *Diabetes  mellitus *Hypertension *CKD stage III *Anemia of chronic disease  Patient was admitted to medical floor and was started on IV clindamycin for her cellulitis.  She had been on Keflex as outpatient with no improvement.  By day of discharge the redness is much improved.  WBC has normalized.  No pain.  Ambulating.  Patient is being switched to oral clindamycin at discharge.  Follow-up with primary care physician within a week.  Other comorbidities remained stable.  CONSULTS OBTAINED:    DRUG ALLERGIES:   Allergies  Allergen Reactions  . Other Rash    Pt reports allergy to metals.    DISCHARGE MEDICATIONS:   Allergies as of 02/12/2018      Reactions   Other Rash   Pt reports allergy to metals.      Medication List    STOP taking these medications   cephALEXin 500 MG capsule Commonly known as:  KEFLEX     TAKE these medications   amLODipine 10 MG tablet Commonly known as:  NORVASC Take 10 mg by mouth daily.   atorvastatin 40 MG tablet Commonly known as:  LIPITOR Take 40 mg by mouth every evening.   busPIRone 5 MG tablet Commonly known as:  BUSPAR Take 5 mg by mouth 2 (two) times daily.   Cyanocobalamin 1500 MCG Tbdp Take 3 tablets by mouth daily.   FLUoxetine 40 MG capsule Commonly known as:  PROZAC Take 40 mg by mouth every evening.   gabapentin 100 MG capsule Commonly known  as:  NEURONTIN Take 200 mg by mouth 3 (three) times daily.   ipratropium-albuterol 0.5-2.5 (3) MG/3ML Soln Commonly known as:  DUONEB Take 3 mLs by nebulization 4 (four) times daily.   latanoprost 0.005 % ophthalmic solution Commonly known as:  XALATAN Place 1 drop into both eyes at bedtime.   LEVEMIR FLEXTOUCH 100 UNIT/ML Pen Generic drug:  Insulin Detemir Inject 32 Units into the skin at bedtime.   metolazone 2.5 MG tablet Commonly known as:  ZAROXOLYN Take 2.5 mg by mouth daily.   NOVOLOG FLEXPEN 100 UNIT/ML FlexPen Generic drug:  insulin aspart Inject 8 Units into the skin  3 (three) times daily as needed for high blood sugar. Sliding scale if blood sugar is above 200 - max 35u daily   omeprazole 20 MG capsule Commonly known as:  PRILOSEC Take 20 mg by mouth daily.   oxyCODONE-acetaminophen 10-325 MG tablet Commonly known as:  PERCOCET Take 1 tablet by mouth 3 (three) times daily as needed for pain.   predniSONE 10 MG (21) Tbpk tablet Commonly known as:  STERAPRED UNI-PAK 21 TAB Take 6 tabs first day, 5 tab on day 2, then 4 on day 3rd, 3 tabs on day 4th , 2 tab on day 5th, and 1 tab on 6th day.   promethazine 25 MG tablet Commonly known as:  PHENERGAN Take 25 mg by mouth every 6 (six) hours as needed for nausea.   tiZANidine 4 MG tablet Commonly known as:  ZANAFLEX Take 4 mg by mouth 3 (three) times daily as needed for muscle spasms.   torsemide 10 MG tablet Commonly known as:  DEMADEX Take 2 tablets (20 mg total) by mouth daily as needed (leg swelling). What changed:  when to take this   traZODone 50 MG tablet Commonly known as:  DESYREL Take 50 mg by mouth at bedtime.   TRELEGY ELLIPTA 100-62.5-25 MCG/INH Aepb Generic drug:  Fluticasone-Umeclidin-Vilant Inhale 1 puff into the lungs daily.   VENTOLIN HFA 108 (90 Base) MCG/ACT inhaler Generic drug:  albuterol Inhale 2 puffs into the lungs every 4 (four) hours as needed.   Vitamin D3 125 MCG (5000 UT) Caps Take 1 capsule by mouth daily.     ASK your doctor about these medications   clindamycin 300 MG capsule Commonly known as:  CLEOCIN Take 1 capsule (300 mg total) by mouth 3 (three) times daily for 5 days. Ask about: Should I take this medication?       Today   VITAL SIGNS:  Blood pressure 113/69, pulse 92, temperature 98 F (36.7 C), temperature source Oral, resp. rate 20, height 5\' 6"  (1.676 m), weight 104.2 kg, SpO2 90 %.  I/O:  No intake or output data in the 24 hours ending 02/21/18 1450  PHYSICAL EXAMINATION:  Physical Exam  GENERAL:  77 y.o.-year-old patient lying  in the bed with no acute distress.  LUNGS: Normal breath sounds bilaterally, no wheezing, rales,rhonchi or crepitation. No use of accessory muscles of respiration.  CARDIOVASCULAR: S1, S2 normal. No murmurs, rubs, or gallops.  ABDOMEN: Soft, non-tender, non-distended. Bowel sounds present. No organomegaly or mass.  NEUROLOGIC: Moves all 4 extremities. PSYCHIATRIC: The patient is alert and oriented x 3.  SKIN: No obvious rash, lesion, or ulcer.   DATA REVIEW:   CBC No results for input(s): WBC, HGB, HCT, PLT in the last 168 hours.  Chemistries  No results for input(s): NA, K, CL, CO2, GLUCOSE, BUN, CREATININE, CALCIUM, MG, AST, ALT, ALKPHOS, BILITOT in the last  168 hours.  Invalid input(s): GFRCGP  Cardiac Enzymes No results for input(s): TROPONINI in the last 168 hours.  Microbiology Results  Results for orders placed or performed during the hospital encounter of 10/25/16  MRSA PCR Screening     Status: None   Collection Time: 10/25/16  5:31 PM  Result Value Ref Range Status   MRSA by PCR NEGATIVE NEGATIVE Final    Comment:        The GeneXpert MRSA Assay (FDA approved for NASAL specimens only), is one component of a comprehensive MRSA colonization surveillance program. It is not intended to diagnose MRSA infection nor to guide or monitor treatment for MRSA infections.     RADIOLOGY:  No results found.  Follow up with PCP in 1 week.  Management plans discussed with the patient, family and they are in agreement.  CODE STATUS:  Code Status History    Date Active Date Inactive Code Status Order ID Comments User Context   02/10/2018 2149 02/12/2018 2141 Full Code [id number]  Demetrios Loll, MD Inpatient   01/04/2018 1347 01/10/2018 1449 Full Code [id number]  Hillary Bow, MD ED   01/07/2017 1624 01/08/2017 1240 DNR [id number]  Asencion Gowda, NP Inpatient   01/06/2017 1838 01/07/2017 1624 Partial Code [id number]  Vernell Morgans, RN Inpatient   01/06/2017 1548  01/06/2017 1838 Full Code [id number]  Bettey Costa, MD Inpatient   01/06/2017 1233 01/06/2017 1548 DNR [id number]  Bettey Costa, MD Inpatient   11/26/2016 1003 11/27/2016 1545 DNR [id number]  Hillary Bow, MD Inpatient   11/25/2016 2126 11/26/2016 1003 Full Code [id number]  Bettey Costa, MD Inpatient   10/25/2016 1614 10/29/2016 1725 Full Code [id number]  Henreitta Leber, MD ED   08/14/2016 2332 08/19/2016 1657 Full Code [id number]  Lance Coon, MD Inpatient   05/05/2016 0747 05/07/2016 1804 Full Code [id number]  Flora Lipps, MD ED   03/24/2016 1416 03/28/2016 1424 Full Code [id number]  Awilda Bill, NP ED      TOTAL TIME TAKING CARE OF THIS PATIENT ON DAY OF DISCHARGE: more than 30 minutes.   Mckenzie Taylor M.D on 02/21/2018 at 2:50 PM  Between 7am to 6pm - Pager - [phone]  After 6pm go to [email or web address] - password [email or web address] [facility]  Office  [phone]  CC: Primary care physician; Denton Lank, MD  Note: This dictation was prepared with Dragon dictation along with smaller phrase technology. Any transcriptional errors that result from this process are unintentional.

## 2018-05-01 ENCOUNTER — Encounter: Payer: Medicare Other | Attending: Physician Assistant | Admitting: Physician Assistant

## 2018-05-01 DIAGNOSIS — Z820 Family history of epilepsy and other diseases of the nervous system: Secondary | ICD-10-CM | POA: Diagnosis not present

## 2018-05-01 DIAGNOSIS — N186 End stage renal disease: Secondary | ICD-10-CM | POA: Insufficient documentation

## 2018-05-01 DIAGNOSIS — Z823 Family history of stroke: Secondary | ICD-10-CM | POA: Diagnosis not present

## 2018-05-01 DIAGNOSIS — L0201 Cutaneous abscess of face: Secondary | ICD-10-CM | POA: Diagnosis not present

## 2018-05-01 DIAGNOSIS — I132 Hypertensive heart and chronic kidney disease with heart failure and with stage 5 chronic kidney disease, or end stage renal disease: Secondary | ICD-10-CM | POA: Diagnosis not present

## 2018-05-01 DIAGNOSIS — Z87891 Personal history of nicotine dependence: Secondary | ICD-10-CM | POA: Insufficient documentation

## 2018-05-01 DIAGNOSIS — L02211 Cutaneous abscess of abdominal wall: Secondary | ICD-10-CM | POA: Diagnosis not present

## 2018-05-01 DIAGNOSIS — L98492 Non-pressure chronic ulcer of skin of other sites with fat layer exposed: Secondary | ICD-10-CM | POA: Diagnosis not present

## 2018-05-01 DIAGNOSIS — E1122 Type 2 diabetes mellitus with diabetic chronic kidney disease: Secondary | ICD-10-CM | POA: Insufficient documentation

## 2018-05-01 DIAGNOSIS — I5042 Chronic combined systolic (congestive) and diastolic (congestive) heart failure: Secondary | ICD-10-CM | POA: Insufficient documentation

## 2018-05-01 DIAGNOSIS — J449 Chronic obstructive pulmonary disease, unspecified: Secondary | ICD-10-CM | POA: Insufficient documentation

## 2018-05-01 DIAGNOSIS — Z8249 Family history of ischemic heart disease and other diseases of the circulatory system: Secondary | ICD-10-CM | POA: Insufficient documentation

## 2018-05-01 DIAGNOSIS — E11622 Type 2 diabetes mellitus with other skin ulcer: Secondary | ICD-10-CM | POA: Insufficient documentation

## 2018-05-01 DIAGNOSIS — Z809 Family history of malignant neoplasm, unspecified: Secondary | ICD-10-CM | POA: Insufficient documentation

## 2018-05-01 DIAGNOSIS — K429 Umbilical hernia without obstruction or gangrene: Secondary | ICD-10-CM | POA: Insufficient documentation

## 2018-05-03 NOTE — Progress Notes (Signed)
Mckenzie Taylor ([id number]) Visit Report for 05/01/2018 Abuse/Suicide Risk Screen Details Patient Name: Mckenzie Taylor, Mckenzie Taylor. Date of Service: 05/01/2018 1:00 PM Medical Record Number: [id number] Patient Account Number: [id number] Date of Birth/Sex: 12/03/1940 (78 y.o. F) Treating RN: Montey Hora Primary Care Tayt Moyers: Denton Lank Other Clinician: Referring Keyry Iracheta: Denton Lank Treating Marlowe Lawes/Extender: Melburn Hake, HOYT Weeks in Treatment: 0 Abuse/Suicide Risk Screen Items Answer ABUSE/SUICIDE RISK SCREEN: Has anyone close to you tried to hurt or harm you recentlyo No Do you feel uncomfortable with anyone in your familyo No Has anyone forced you do things that you didnot want to doo No Do you have any thoughts of harming yourselfo No Patient displays signs or symptoms of abuse and/or neglect. No Electronic Signature(s) Signed: 05/02/2018 4:18:49 PM By: Montey Hora Entered By: Montey Hora on 05/01/2018 13:32:28 Mckenzie Taylor ([id number]) -------------------------------------------------------------------------------- Activities of Daily Living Details Patient Name: [name], [name]. Date of Service: 05/01/2018 1:00 PM Medical Record Number: [id number] Patient Account Number: [id number] Date of Birth/Sex: 1940-09-10 (78 y.o. F) Treating RN: Montey Hora Primary Care Waver Dibiasio: Denton Lank Other Clinician: Referring Felecity Lemaster: Denton Lank Treating Mashell Sieben/Extender: Melburn Hake, HOYT Weeks in Treatment: 0 Activities of Daily Living Items Answer Activities of Daily Living (Please select one for each item) Drive Automobile Not Able Take Medications Completely Able Use Telephone Completely Able Care for Appearance Completely Able Use Toilet Completely Able Bath / Shower Need Assistance Dress Self Completely Able Feed Self Completely Able Walk Need Assistance Get In / Out Bed Completely [place] Need Assistance Shop for Self Need Assistance Electronic Signature(s) Signed: 05/02/2018 4:18:49 PM By: Montey Hora Entered By: Montey Hora on 05/01/2018 13:33:10 Mckenzie Taylor ([id number]) -------------------------------------------------------------------------------- Education Assessment Details Patient Name: Mckenzie Taylor. Date of Service: 05/01/2018 1:00 PM Medical Record Number: [id number] Patient Account Number: [id number] Date of Birth/Sex: Aug 20, 1940 (78 y.o. F) Treating RN: Montey Hora Primary Care Staceyann Knouff: Denton Lank Other Clinician: Referring Mallarie Voorhies: Denton Lank Treating Vanassa Penniman/Extender: Sharalyn Ink in Treatment: 0 Primary Learner Assessed: Patient Learning Preferences/Education Level/Primary Language Learning Preference: Explanation, Demonstration Highest Education Level: High School Preferred Language: English Cognitive Barrier Assessment/Beliefs Language Barrier: No Translator Needed: No Memory Deficit: No Emotional Barrier: No Cultural/Religious Beliefs Affecting Medical Care: No Physical Barrier Assessment Impaired Vision: No Impaired Hearing: No Decreased Hand dexterity: No Knowledge/Comprehension Assessment Knowledge Level: Medium Comprehension Level: Medium Ability to understand written Medium instructions: Ability to understand verbal Medium instructions: Motivation Assessment Anxiety Level: Calm Cooperation: Cooperative Education Importance: Acknowledges Need Interest in Health Problems: Asks Questions Perception: Coherent Willingness to Engage in Self- Medium Management Activities: Readiness to Engage in Self- Medium Management Activities: Electronic Signature(s) Signed: 05/02/2018 4:18:49 PM By: Montey Hora Entered By: Montey Hora on 05/01/2018 13:33:32 Mckenzie Taylor ([id number]) -------------------------------------------------------------------------------- Fall Risk Assessment  Details Patient Name: Mckenzie Taylor. Date of Service: 05/01/2018 1:00 PM Medical Record Number: [id number] Patient Account Number: [id number] Date of Birth/Sex: 22-Nov-1940 (78 y.o. F) Treating RN: Montey Hora Primary Care Fritz Cauthon: Denton Lank Other Clinician: Referring Roe Koffman: Denton Lank Treating Jaleen Finch/Extender: Melburn Hake, HOYT Weeks in Treatment: 0 Fall Risk Assessment Items Have you had 2 or more falls in the last 12 monthso 0 No Have you had any fall that resulted in injury in the last 12 monthso 0 No FALL RISK ASSESSMENT: History of falling - immediate or within 3 months 0 No Secondary diagnosis 0 No Ambulatory aid None/bed rest/wheelchair/nurse 0 No Crutches/cane/walker 15 Yes Furniture 0  No IV Access/Saline Lock 0 No Gait/Training Normal/bed rest/immobile 0 No Weak 10 Yes Impaired 20 Yes Mental Status Oriented to own ability 0 Yes Electronic Signature(s) Signed: 05/02/2018 4:18:49 PM By: Montey Hora Entered By: Montey Hora on 05/01/2018 13:33:43 Mckenzie Taylor ([id number]) -------------------------------------------------------------------------------- Foot Assessment Details Patient Name: Mckenzie Taylor. Date of Service: 05/01/2018 1:00 PM Medical Record Number: [id number] Patient Account Number: [id number] Date of Birth/Sex: [birth year] (78 y.o. F) Treating RN: Montey Hora Primary Care Dasani Crear: Denton Lank Other Clinician: Referring Evoleht Hovatter: Denton Lank Treating Demarion Pondexter/Extender: Melburn Hake, HOYT Weeks in Treatment: 0 Foot Assessment Items Site Locations + = Sensation present, - = Sensation absent, C = Callus, U = Ulcer R = Redness, W = Warmth, M = Maceration, PU = Pre-ulcerative lesion F = Fissure, S = Swelling, D = Dryness Assessment Right: Left: Other Deformity: No No Prior Foot Ulcer: No No Prior Amputation: No No Charcot Joint: No No Ambulatory Status: Ambulatory Without Help Gait: Unsteady Electronic  Signature(s) Signed: 05/02/2018 4:18:49 PM By: Montey Hora Entered By: Montey Hora on 05/01/2018 13:34:10 Mckenzie Taylor ([id number]) -------------------------------------------------------------------------------- Nutrition Risk Assessment Details Patient Name: Mckenzie Taylor. Date of Service: 05/01/2018 1:00 PM Medical Record Number: [id number] Patient Account Number: [id number] Date of Birth/Sex: 1940/12/08 (78 y.o. F) Treating RN: Montey Hora Primary Care Geniene List: Denton Lank Other Clinician: Referring Allona Gondek: Denton Lank Treating Maxi Carreras/Extender: Melburn Hake, HOYT Weeks in Treatment: 0 Height (in): 64 Weight (lbs): 204 Body Mass Index (BMI): 35 Nutrition Risk Assessment Items NUTRITION RISK SCREEN: I have an illness or condition that made me change the kind and/or amount of 0 No food I eat I eat fewer than two meals per day 0 No I eat few fruits and vegetables, or milk products 0 No I have three or more drinks of beer, liquor or wine almost every day 0 No I have tooth or mouth problems that make it hard for me to eat 0 No I don't always have enough money to buy the food I need 0 No I eat alone most of the time 0 No I take three or more different prescribed or over-the-counter drugs a day 1 Yes Without wanting to, I have lost or gained 10 pounds in the last six months 0 No I am not always physically able to shop, cook and/or feed myself 0 No Nutrition Protocols Good Risk Protocol 0 No interventions needed Moderate Risk Protocol Electronic Signature(s) Signed: 05/02/2018 4:18:49 PM By: Montey Hora Entered By: Montey Hora on 05/01/2018 13:33:53

## 2018-05-03 NOTE — Progress Notes (Signed)
BRIYANNA, BILLINGHAM ([id number]) Visit Report for 05/01/2018 Allergy List Details Patient Name: ALAJA, GOLDINGER. Date of Service: 05/01/2018 1:00 PM Medical Record Number: [id number] Patient Account Number: [id number] Date of Birth/Sex: Mar 28, 1940 (78 y.o. F) Treating RN: Montey Hora Primary Care Indigo Chaddock: Denton Lank Other Clinician: Referring Girtie Wiersma: Denton Lank Treating Jai Steil/Extender: Melburn Hake, HOYT Weeks in Treatment: 0 Allergies Active Allergies trace metals Allergy Notes Electronic Signature(s) Signed: 05/02/2018 4:18:49 PM By: Montey Hora Entered By: Montey Hora on 05/01/2018 13:32:18 [name] ([id number]) -------------------------------------------------------------------------------- Arrival Information Details Patient Name: [name]. Date of Service: 05/01/2018 1:00 PM Medical Record Number: [id number] Patient Account Number: [id number] Date of Birth/Sex: 05-16-1940 (78 y.o. F) Treating RN: Montey Hora Primary Care Joie Hipps: Denton Lank Other Clinician: Referring Jacksyn Beeks: Denton Lank Treating Kinaya Hilliker/Extender: Melburn Hake, HOYT Weeks in Treatment: 0 Visit Information Patient Arrived: Ambulatory Arrival Time: 13:27 Accompanied By: daughter Transfer Assistance: None Patient Identification Verified: Yes Secondary Verification Process Completed: Yes Patient Has Alerts: Yes Patient Alerts: DMII Electronic Signature(s) Signed: 05/02/2018 4:18:49 PM By: Montey Hora Entered By: Montey Hora on 05/01/2018 13:28:15 [name] ([id number]) -------------------------------------------------------------------------------- Clinic Level of Care Assessment Details Patient Name: [name]. Date of Service: 05/01/2018 1:00 PM Medical Record Number: [id number] Patient Account Number: [id number] Date of Birth/Sex: 03/08/1941 (78 y.o. F) Treating RN: Harold Barban Primary Care Taiden Raybourn: Denton Lank Other  Clinician: Referring Brain Honeycutt: Denton Lank Treating Terrianne Cavness/Extender: Melburn Hake, HOYT Weeks in Treatment: 0 Clinic Level of Care Assessment Items TOOL 3 Quantity Score []  - Use when EandM and Procedure is performed on FOLLOW-UP visit 0 ASSESSMENTS - Nursing Assessment / Reassessment X - Reassessment of Co-morbidities (includes updates in patient status) 1 10 X- 1 5 Reassessment of Adherence to Treatment Plan ASSESSMENTS - Wound and Skin Assessment / Reassessment []  - Points for Wound Assessment can only be taken for a new wound of unknown or different 0 etiology and a procedure is NOT performed to that wound X- 1 5 Simple Wound Assessment / Reassessment - one wound []  - 0 Complex Wound Assessment / Reassessment - multiple wounds []  - 0 Dermatologic / Skin Assessment (not related to wound area) ASSESSMENTS - Focused Assessment []  - Circumferential Edema Measurements - multi extremities 0 []  - 0 Nutritional Assessment / Counseling / Intervention []  - 0 Lower Extremity Assessment (monofilament, tuning fork, pulses) []  - 0 Peripheral Arterial Disease Assessment (using hand held doppler) ASSESSMENTS - Ostomy and/or Continence Assessment and Care []  - Incontinence Assessment and Management 0 []  - 0 Ostomy Care Assessment and Management (repouching, etc.) PROCESS - Coordination of Care []  - Points for Discharge Coordination can only be taken for a new wound of unknown or different 0 etiology and a procedure is NOT performed to that wound X- 1 15 Simple Patient / Family Education for ongoing care []  - 0 Complex (extensive) Patient / Family Education for ongoing care X- 1 10 Staff obtains Programmer, systems, Records, Test Results / Process Orders []  - 0 Staff telephones HHA, Nursing Homes / Clarify orders / etc []  - 0 Routine Transfer to another Facility (non-emergent condition) []  - 0 Routine Hospital Admission (non-emergent condition) CAROLEENA, PAOLINI ([id number]) []  - 0 New  Admissions / Biomedical engineer / Ordering NPWT, Apligraf, etc. []  - 0 Emergency Hospital Admission (emergent condition) X- 1 10 Simple Discharge Coordination []  - 0 Complex (extensive) Discharge Coordination PROCESS - Special Needs []  - Pediatric / Minor Patient Management 0 []  - 0 Isolation Patient Management []  - 0 Hearing /  Language / Visual special needs []  - 0 Assessment of Community assistance (transportation, D/C planning, etc.) []  - 0 Additional assistance / Altered mentation []  - 0 Support Surface(s) Assessment (bed, cushion, seat, etc.) INTERVENTIONS - Wound Cleansing / Measurement []  - Points for Wound Cleaning / Measurement, Wound Dressing, Specimen Collection and 0 Specimen taken to lab can only be taken for a new wound of unknown or different etiology and a procedure is NOT performed to that wound X- 1 5 Simple Wound Cleansing - one wound []  - 0 Complex Wound Cleansing - multiple wounds X- 1 5 Wound Imaging (photographs - any number of wounds) []  - 0 Wound Tracing (instead of photographs) X- 1 5 Simple Wound Measurement - one wound []  - 0 Complex Wound Measurement - multiple wounds INTERVENTIONS - Wound Dressings X - Small Wound Dressing one or multiple wounds 1 10 []  - 0 Medium Wound Dressing one or multiple wounds []  - 0 Large Wound Dressing one or multiple wounds INTERVENTIONS - Miscellaneous []  - External ear exam 0 []  - 0 Specimen Collection (cultures, biopsies, blood, body fluids, etc.) []  - 0 Specimen(s) / Culture(s) sent or taken to Lab for analysis []  - 0 Patient Transfer (multiple staff / Civil Service fast streamer / Similar devices) []  - 0 Simple Staple / Suture removal (25 or less) []  - 0 Complex Staple / Suture removal (26 or more) TOREE, EDLING. ([id number]) []  - 0 Hypo / Hyperglycemic Management (close monitor of Blood Glucose) []  - 0 Ankle / Brachial Index (ABI) - do not check if billed separately X- 1 5 Vital Signs Has the patient  been seen at the hospital within the last three years: Yes Total Score: 85 Level Of Care: New/Established - Level 3 Electronic Signature(s) Signed: 05/03/2018 8:37:22 AM By: Harold Barban Entered By: Harold Barban on 05/01/2018 14:26:35 [name] ([id number]) -------------------------------------------------------------------------------- Lower Extremity Assessment Details Patient Name: [name]. Date of Service: 05/01/2018 1:00 PM Medical Record Number: [id number] Patient Account Number: [id number] Date of Birth/Sex: [birth year] (77 y.o. F) Treating RN: Montey Hora Primary Care Amarii Amy: Denton Lank Other Clinician: Referring Emmalynne Courtney: Denton Lank Treating Jalin Alicea/Extender: Melburn Hake, HOYT Weeks in Treatment: 0 Electronic Signature(s) Signed: 05/02/2018 4:18:49 PM By: Montey Hora Entered By: Montey Hora on 05/01/2018 13:31:53 [name] ([id number]) -------------------------------------------------------------------------------- Multi Wound Chart Details Patient Name: [name]. Date of Service: 05/01/2018 1:00 PM Medical Record Number: [id number] Patient Account Number: [id number] Date of Birth/Sex: 1941/01/17 (77 y.o. F) Treating RN: Harold Barban Primary Care Chevonne Bostrom: Denton Lank Other Clinician: Referring Desteny Freeman: Denton Lank Treating Zillah Alexie/Extender: Melburn Hake, HOYT Weeks in Treatment: 0 Vital Signs Height(in): 64 Pulse(bpm): 101 Weight(lbs): 204 Blood Pressure(mmHg): 135/74 Body Mass Index(BMI): 35 Temperature(F): 97.7 Respiratory Rate 20 (breaths/min): Photos: [N/A:N/A] Wound Location: Left Abdomen - midline N/A N/A Wounding Event: Gradually Appeared N/A N/A Primary Etiology: Cyst N/A N/A Comorbid History: Chronic Obstructive N/A N/A Pulmonary Disease (COPD), Congestive Heart Failure, Hypertension, Type II Diabetes, End Stage Renal Disease Date Acquired: 01/23/2018 N/A N/A Weeks of Treatment: 0 N/A  N/A Wound Status: Open N/A N/A Measurements L x W x D 1.4x2.3x0.1 N/A N/A (cm) Area (cm) : 2.529 N/A N/A Volume (cm) : 0.253 N/A N/A Classification: Full Thickness Without N/A N/A Exposed Support Structures Exudate Amount: Medium N/A N/A Exudate Type: Serous N/A N/A Exudate Color: amber N/A N/A Wound Margin: Flat and Intact N/A N/A Granulation Amount: Small (1-33%) N/A N/A Granulation Quality: Pink N/A N/A Necrotic Amount: Large (67-100%) N/A N/A Exposed Structures:  Fat Layer (Subcutaneous N/A N/A Tissue) Exposed: Yes Fascia: No Tendon: No WILMETTA, SPEISER. ([id number]) Muscle: No Joint: No Bone: No Epithelialization: None N/A N/A Periwound Skin Texture: Excoriation: No N/A N/A Induration: No Callus: No Crepitus: No Rash: No Scarring: No Periwound Skin Moisture: Maceration: No N/A N/A Dry/Scaly: No Periwound Skin Color: Erythema: Yes N/A N/A Atrophie Blanche: No Cyanosis: No Ecchymosis: No Hemosiderin Staining: No Mottled: No Pallor: No Rubor: No Erythema Location: Circumferential N/A N/A Temperature: No Abnormality N/A N/A Tenderness on Palpation: Yes N/A N/A Wound Preparation: Ulcer Cleansing: N/A N/A Rinsed/Irrigated with Saline Topical Anesthetic Applied: Other: lidocaine 4% Treatment Notes Electronic Signature(s) Signed: 05/03/2018 8:37:22 AM By: Harold Barban Entered By: Harold Barban on 05/01/2018 14:20:37 [name] ([id number]) -------------------------------------------------------------------------------- [place] Details Patient Name: DEVANSHI, CALIFF. Date of Service: 05/01/2018 1:00 PM Medical Record Number: [id number] Patient Account Number: [id number] Date of Birth/Sex: Aug 11, 1940 (77 y.o. F) Treating RN: Harold Barban Primary Care Edrei Norgaard: Denton Lank Other Clinician: Referring Otie Headlee: Denton Lank Treating Neyda Durango/Extender: Melburn Hake, HOYT Weeks in Treatment: 0 Active Inactive Wound/Skin  Impairment Nursing Diagnoses: Impaired tissue integrity Goals: Ulcer/skin breakdown will have a volume reduction of 30% by week 4 Date Initiated: 05/01/2018 Target Resolution Date: 05/30/2018 Goal Status: Active Interventions: Assess patient/caregiver ability to obtain necessary supplies Assess patient/caregiver ability to perform ulcer/skin care regimen upon admission and as needed Assess ulceration(s) every visit Notes: Electronic Signature(s) Signed: 05/03/2018 8:37:22 AM By: Harold Barban Entered By: Harold Barban on 05/01/2018 14:20:17 [name] ([id number]) -------------------------------------------------------------------------------- Pain Assessment Details Patient Name: [name]. Date of Service: 05/01/2018 1:00 PM Medical Record Number: [id number] Patient Account Number: [id number] Date of Birth/Sex: 07-May-1940 (77 y.o. F) Treating RN: Montey Hora Primary Care Amara Justen: Denton Lank Other Clinician: Referring Kennidy Lamke: Denton Lank Treating Mable Dara/Extender: Melburn Hake, HOYT Weeks in Treatment: 0 Active Problems Location of Pain Severity and Description of Pain Patient Has Paino Yes Site Locations Pain Location: Pain in Ulcers With Dressing Change: Yes Duration of the Pain. Constant / Intermittento Constant Pain Management and Medication Current Pain Management: Electronic Signature(s) Signed: 05/02/2018 4:18:49 PM By: Montey Hora Entered By: Montey Hora on 05/01/2018 13:28:30 [name] ([id number]) -------------------------------------------------------------------------------- Patient/Caregiver Education Details Patient Name: [name]. Date of Service: 05/01/2018 1:00 PM Medical Record Number: [id number] Patient Account Number: [id number] Date of Birth/Gender: 08-31-1940 (77 y.o. F) Treating RN: Harold Barban Primary Care Physician: Denton Lank Other Clinician: Referring Physician: Denton Lank Treating  Physician/Extender: Sharalyn Ink in Treatment: 0 Education Assessment Education Provided To: Patient Education Topics Provided Wound/Skin Impairment: Handouts: Caring for Your Ulcer Methods: Demonstration, Explain/Verbal Responses: State content correctly Electronic Signature(s) Signed: 05/03/2018 8:37:22 AM By: Harold Barban Entered By: Harold Barban on 05/01/2018 14:21:03 [name] ([id number]) -------------------------------------------------------------------------------- Wound Assessment Details Patient Name: [name]. Date of Service: 05/01/2018 1:00 PM Medical Record Number: [id number] Patient Account Number: [id number] Date of Birth/Sex: [birth year] (77 y.o. F) Treating RN: Montey Hora Primary Care Kendan Cornforth: Denton Lank Other Clinician: Referring Ziquan Fidel: Denton Lank Treating Barclay Lennox/Extender: Melburn Hake, HOYT Weeks in Treatment: 0 Wound Status Wound Number: 1 Primary Cyst Etiology: Wound Location: Left Abdomen - midline Wound Open Wounding Event: Gradually Appeared Status: Date Acquired: 01/23/2018 Comorbid Chronic Obstructive Pulmonary Disease Weeks Of Treatment: 0 History: (COPD), Congestive Heart Failure, Clustered Wound: No Hypertension, Type II Diabetes, End Stage Renal Disease Photos Photo Uploaded By: Montey Hora on 05/01/2018 14:18:35 Wound Measurements Length: (cm) 1.4 Width: (cm) 2.3 Depth: (cm) 0.1 Area: (cm) 2.529  Volume: (cm) 0.253 % Reduction in Area: % Reduction in Volume: Epithelialization: None Tunneling: No Undermining: No Wound Description Full Thickness Without Exposed Support Classification: Structures Wound Margin: Flat and Intact Exudate Medium Amount: Exudate Type: Serous Exudate Color: amber Foul Odor After Cleansing: No Slough/Fibrino Yes Wound Bed Granulation Amount: Small (1-33%) Exposed Structure Granulation Quality: Pink Fascia Exposed: No Necrotic Amount: Large  (67-100%) Fat Layer (Subcutaneous Tissue) Exposed: Yes Necrotic Quality: Adherent Slough Tendon Exposed: No Muscle Exposed: No Joint Exposed: No SUSSAN, METER ([id number]) Bone Exposed: No Periwound Skin Texture Texture Color No Abnormalities Noted: No No Abnormalities Noted: No Callus: No Atrophie Blanche: No Crepitus: No Cyanosis: No Excoriation: No Ecchymosis: No Induration: No Erythema: Yes Rash: No Erythema Location: Circumferential Scarring: No Hemosiderin Staining: No Mottled: No Moisture Pallor: No No Abnormalities Noted: No Rubor: No Dry / Scaly: No Maceration: No Temperature / Pain Temperature: No Abnormality Tenderness on Palpation: Yes Wound Preparation Ulcer Cleansing: Rinsed/Irrigated with Saline Topical Anesthetic Applied: Other: lidocaine 4%, Electronic Signature(s) Signed: 05/02/2018 4:18:49 PM By: Montey Hora Entered By: Montey Hora on 05/01/2018 13:48:33 [name] ([id number]) -------------------------------------------------------------------------------- Vitals Details Patient Name: [name]. Date of Service: 05/01/2018 1:00 PM Medical Record Number: [id number] Patient Account Number: [id number] Date of Birth/Sex: 1940-09-13 (77 y.o. F) Treating RN: Montey Hora Primary Care Shadoe Bethel: Denton Lank Other Clinician: Referring Enrico Eaddy: Denton Lank Treating Bowyn Mercier/Extender: Melburn Hake, HOYT Weeks in Treatment: 0 Vital Signs Time Taken: 13:28 Temperature (F): 97.7 Height (in): 64 Pulse (bpm): 101 Source: Measured Respiratory Rate (breaths/min): 20 Weight (lbs): 204 Blood Pressure (mmHg): 135/74 Source: Measured Reference Range: 80 - 120 mg / dl Body Mass Index (BMI): 35 Airway Electronic Signature(s) Signed: 05/02/2018 4:18:49 PM By: Montey Hora Entered By: Montey Hora on 05/01/2018 13:31:45

## 2018-05-03 NOTE — Progress Notes (Signed)
Mckenzie Taylor ([id number]) Visit Report for 05/01/2018 Chief Complaint Document Details Patient Name: Mckenzie Taylor, Mckenzie Taylor. Date of Service: 05/01/2018 1:00 PM Medical Record Number: [id number] Patient Account Number: [id number] Date of Birth/Sex: September 26, 1940 (78 y.o. F) Treating RN: Mckenzie Taylor Primary Care Provider: Denton Taylor Other Clinician: Referring Provider: Denton Taylor Treating Provider/Extender: Mckenzie Taylor Weeks in Treatment: 0 Information Obtained from: Patient Chief Complaint Left abdominal wall ulcer Electronic Signature(s) Signed: 05/02/2018 12:02:18 AM By: Mckenzie Keeler Taylor Entered By: Mckenzie Taylor on 05/01/2018 14:15:46 Mckenzie Taylor ([id number]) -------------------------------------------------------------------------------- Debridement Details Patient Name: Mckenzie Taylor. Date of Service: 05/01/2018 1:00 PM Medical Record Number: [id number] Patient Account Number: [id number] Date of Birth/Sex: [birth year] (78 y.o. F) Treating RN: Mckenzie Taylor Primary Care Provider: Denton Taylor Other Clinician: Referring Provider: Denton Taylor Treating Provider/Extender: Mckenzie Taylor Weeks in Treatment: 0 Debridement Performed for Wound #1 Left Abdomen - midline Assessment: Performed By: Physician Mckenzie Taylor Debridement Type: Debridement Level of Consciousness (Pre- Awake and Alert procedure): Pre-procedure Verification/Time Yes - 14:23 Out Taken: Start Time: 14:23 Pain Control: Lidocaine Total Area Debrided (L x W): 1.4 (cm) x 2.3 (cm) = 3.22 (cm) Tissue and other material Non-Viable, Slough, Subcutaneous, Slough debrided: Level: Skin/Subcutaneous Tissue Debridement Description: Excisional Instrument: Curette Bleeding: Minimum Hemostasis Achieved: Pressure End Time: 14:26 Procedural Pain: 0 Post Procedural Pain: 0 Response to Treatment: Procedure was tolerated well Level of Consciousness Awake and  Alert (Post-procedure): Post Debridement Measurements of Total Wound Length: (cm) 1.4 Width: (cm) 2.3 Depth: (cm) 0.1 Volume: (cm) 0.253 Character of Wound/Ulcer Post Debridement: Improved Post Procedure Diagnosis Same as Pre-procedure Electronic Signature(s) Signed: 05/02/2018 12:02:18 AM By: Mckenzie Keeler Taylor Signed: 05/03/2018 8:37:22 AM By: Mckenzie Taylor Entered By: Mckenzie Taylor on 05/01/2018 14:24:55 Mckenzie Taylor ([id number]) -------------------------------------------------------------------------------- HPI Details Patient Name: Mckenzie Taylor. Date of Service: 05/01/2018 1:00 PM Medical Record Number: [id number] Patient Account Number: [id number] Date of Birth/Sex: [birth year] (78 y.o. F) Treating RN: Mckenzie Taylor Primary Care Provider: Denton Taylor Other Clinician: Referring Provider: Denton Taylor Treating Provider/Extender: Mckenzie Taylor Weeks in Treatment: 0 History of Present Illness HPI Description: 05/01/18 on evaluation today patient presents for initial inspection in our clinic concerning an issue that she had with the two teeniest abscess over the abdominal region which occurred back in November 2019. Subsequently she was initially given Barrett although more recently they mainly just been using peroxide and covering this with the bandage at home. She was given some Silvadene that has not really seem to make a big difference for her. Fortunately there's no signs of infection at this time which is excellent news. She continues to cleans this with peroxide pretty much daily. Other than this patient does have a history of hypertension, congestive heart failure, COPD, and chronic kidney disease stage IV. No fevers, chills, nausea, or vomiting noted at this time. Patient's hemoglobin A1c was 9.3 as measured last on 02/04/18. Electronic Signature(s) Signed: 05/02/2018 12:02:18 AM By: Mckenzie Keeler Taylor Entered By: Mckenzie Taylor on  05/01/2018 14:37:03 Mckenzie Taylor ([id number]) -------------------------------------------------------------------------------- Physical Exam Details Patient Name: Mckenzie Taylor. Date of Service: 05/01/2018 1:00 PM Medical Record Number: [id number] Patient Account Number: [id number] Date of Birth/Sex: 09-Dec-1940 (78 y.o. F) Treating RN: Mckenzie Taylor Primary Care Provider: Denton Taylor Other Clinician: Referring Provider: Denton Taylor Treating Provider/Extender: Mckenzie Taylor Weeks in Treatment: 0 Constitutional patient is hypertensive.. pulse regular and within target range for patient.Marland Kitchen respirations regular, non-labored and  within target range for patient.Marland Kitchen temperature within target range for patient.. Well-nourished and well-hydrated in no acute distress. Eyes conjunctiva clear no eyelid edema noted. pupils equal round and reactive to light and accommodation. Ears, Nose, Mouth, and Throat no gross abnormality of ear auricles or external auditory canals. normal hearing noted during conversation. mucus membranes moist. Respiratory normal breathing without difficulty on oxygen which is standard for her. clear to auscultation bilaterally. Cardiovascular regular rate and rhythm with normal S1, S2. no clubbing, cyanosis, significant edema, <3 sec cap refill. Gastrointestinal (GI) soft, non-tender, non-distended, +BS. Umbilical hernia noted on examination. Musculoskeletal Patient unable to walk without assistance. Psychiatric this patient is able to make decisions and demonstrates good insight into disease process. Alert and Oriented x 3. pleasant and cooperative. Notes Upon evaluation patient's wound bed actually did require some sharp debridement today to remove the necrotic tissue from the surface of the wound. She tolerated this without complication good news is post debridement the wound bed actually appear to be doing separately better which was excellent news.  Overall I'm very pleased with how things seem to appear at this time the biggest issue and challenge I see is that this is a region of scar tissue which is gonna make it somewhat more difficult to completely healed quickly though we will definitely give this our best shot. Electronic Signature(s) Signed: 05/02/2018 12:02:18 AM By: Mckenzie Keeler Taylor Entered By: Mckenzie Taylor on 05/01/2018 14:36:48 Mckenzie Taylor ([id number]) -------------------------------------------------------------------------------- Physician Orders Details Patient Name: Mckenzie Taylor. Date of Service: 05/01/2018 1:00 PM Medical Record Number: [id number] Patient Account Number: [id number] Date of Birth/Sex: 08/14/1940 (77 y.o. F) Treating RN: Mckenzie Taylor Primary Care Provider: Denton Taylor Other Clinician: Referring Provider: Denton Taylor Treating Provider/Extender: Mckenzie Hake, Faduma Cho Weeks in Treatment: 0 Verbal / Phone Orders: No Diagnosis Coding ICD-10 Coding Code Description L02.01 Cutaneous abscess of face L98.492 Non-pressure chronic ulcer of skin of other sites with fat layer exposed I10 Essential (primary) hypertension I50.42 Chronic combined systolic (congestive) and diastolic (congestive) heart failure J44.9 Chronic obstructive pulmonary disease, unspecified N18.4 Chronic kidney disease, stage 4 (severe) Wound Cleansing Wound #1 Left Abdomen - midline o May Shower, gently pat wound dry prior to applying new dressing. - Clean with Dial Antibacterial soap, rinse and pat dry. Primary Wound Dressing o Collagen Secondary Dressing Wound #1 Left Abdomen - midline o Boardered Foam Dressing Dressing Change Frequency Wound #1 Left Abdomen - midline o Change Dressing Monday, Wednesday, Friday Follow-up Appointments o Return Appointment in 1 week. Electronic Signature(s) Signed: 05/02/2018 12:02:18 AM By: Mckenzie Keeler Taylor Signed: 05/03/2018 8:37:22 AM By: Mckenzie Taylor Entered  By: Mckenzie Taylor on 05/01/2018 14:29:51 Mckenzie Taylor ([id number]) -------------------------------------------------------------------------------- Problem List Details Patient Name: Mckenzie Taylor, Mckenzie Taylor. Date of Service: 05/01/2018 1:00 PM Medical Record Number: [id number] Patient Account Number: [id number] Date of Birth/Sex: October 09, 1940 (77 y.o. F) Treating RN: Mckenzie Taylor Primary Care Provider: Denton Taylor Other Clinician: Referring Provider: Denton Taylor Treating Provider/Extender: Mckenzie Hake, Tiarra Anastacio Weeks in Treatment: 0 Active Problems ICD-10 Evaluated Encounter Code Description Active Date Today Diagnosis L02.211 Cutaneous abscess of abdominal wall 05/01/2018 No Yes L98.492 Non-pressure chronic ulcer of skin of other sites with fat layer 05/01/2018 No Yes exposed [place] (primary) hypertension 05/01/2018 No Yes I50.42 Chronic combined systolic (congestive) and diastolic 09/11/5282 No Yes (congestive) heart failure J44.9 Chronic obstructive pulmonary disease, unspecified 05/01/2018 No Yes N18.4 Chronic kidney disease, stage 4 (severe) 05/01/2018 No Yes Inactive Problems Resolved Problems Electronic Signature(s) Signed:  05/02/2018 12:02:18 AM By: Mckenzie Keeler Taylor Entered By: Mckenzie Taylor on 05/01/2018 14:35:09 Mckenzie Taylor ([id number]) -------------------------------------------------------------------------------- Progress Note Details Patient Name: Mckenzie Taylor. Date of Service: 05/01/2018 1:00 PM Medical Record Number: [id number] Patient Account Number: [id number] Date of Birth/Sex: 1940-08-29 (77 y.o. F) Treating RN: Mckenzie Taylor Primary Care Provider: Denton Taylor Other Clinician: Referring Provider: Denton Taylor Treating Provider/Extender: Mckenzie Hake, Jaysiah Marchetta Weeks in Treatment: 0 Subjective Chief Complaint Information obtained from Patient Left abdominal wall ulcer History of Present Illness (HPI) 05/01/18 on evaluation today patient  presents for initial inspection in our clinic concerning an issue that she had with the two teeniest abscess over the abdominal region which occurred back in November 2019. Subsequently she was initially given [place] although more recently they mainly just been using peroxide and covering this with the bandage at home. She was given some Silvadene that has not really seem to make a big difference for her. Fortunately there's no signs of infection at this time which is excellent news. She continues to cleans this with peroxide pretty much daily. Other than this patient does have a history of hypertension, congestive heart failure, COPD, and chronic kidney disease stage IV. No fevers, chills, nausea, or vomiting noted at this time. Patient's hemoglobin A1c was 9.3 as measured last on 02/04/18. Wound History Patient presents with 1 open wound that has been present for approximately Nov 2019. Patient has been treating wound in the following manner: silvadene and bandage. Laboratory tests have not been performed in the last month. Patient reportedly has not tested positive for an antibiotic resistant organism. Patient reportedly has not tested positive for osteomyelitis. Patient reportedly has not had testing performed to evaluate circulation in the legs. Patient History Information obtained from Patient. Allergies trace metals Family History Cancer - Paternal Grandparents,Father,Child,Siblings, Heart Disease - Mother, Hypertension - Mother, Lung Disease - Mother, Stroke - Mother,Child, No family history of Hereditary Spherocytosis, Kidney Disease, Seizures, Thyroid Problems, Tuberculosis. Social History Former smoker - 2016, Marital Status - Separated, Alcohol Use - Never, Drug Use - No History, Caffeine Use - Daily. Medical History Eyes Denies history of Cataracts, Glaucoma, Optic Neuritis Ear/Nose/Mouth/Throat Denies history of Chronic sinus problems/congestion, Middle ear  problems Hematologic/Lymphatic Denies history of Anemia, Hemophilia, Human Immunodeficiency Virus, Lymphedema, Sickle Cell Disease Respiratory Patient has history of Chronic Obstructive Pulmonary Disease (COPD) Denies history of Aspiration, Asthma, Pneumothorax, Sleep Apnea, Tuberculosis Cardiovascular Mckenzie Taylor, Mckenzie Taylor ([id number]) Patient has history of Congestive Heart Failure, Hypertension Denies history of Angina, Arrhythmia, Coronary Artery Disease, Deep Vein Thrombosis, Hypotension, Myocardial Infarction, Peripheral Arterial Disease, Peripheral Venous Disease, Phlebitis, Vasculitis Gastrointestinal Denies history of Cirrhosis , Colitis, Crohn s, Hepatitis A, Hepatitis B, Hepatitis C Endocrine Patient has history of Type II Diabetes Denies history of Type I Diabetes Genitourinary Patient has history of End Stage Renal Disease - CKD stage 4 Immunological Denies history of Lupus Erythematosus, Raynaud s, Scleroderma Integumentary (Skin) Denies history of History of Burn, History of pressure wounds Musculoskeletal Denies history of Gout, Rheumatoid Arthritis, Osteoarthritis, Osteomyelitis Neurologic Denies history of Dementia, Neuropathy, Quadriplegia, Paraplegia, Seizure Disorder Oncologic Denies history of Received Chemotherapy, Received Radiation Psychiatric Denies history of Anorexia/bulimia, Confinement Anxiety Medical And Surgical History Notes Respiratory home O2 at 3L Genitourinary polycystic kidneys Review of Systems (ROS) Constitutional Symptoms (General Health) Denies complaints or symptoms of Fatigue, Fever, Chills, Marked Weight Change. Eyes Denies complaints or symptoms of Dry Eyes, Vision Changes, Glasses / Contacts. Ear/Nose/Mouth/Throat Denies complaints or symptoms of Difficult clearing  ears, Sinusitis. Hematologic/Lymphatic Denies complaints or symptoms of Bleeding / Clotting Disorders, Human Immunodeficiency Virus. Respiratory Complains or has  symptoms of Shortness of Breath. Denies complaints or symptoms of Chronic or frequent coughs. Cardiovascular Denies complaints or symptoms of Chest pain, LE edema. Gastrointestinal Denies complaints or symptoms of Frequent diarrhea, Nausea, Vomiting. Endocrine Denies complaints or symptoms of Hepatitis, Thyroid disease, Polydypsia (Excessive Thirst). Genitourinary Complains or has symptoms of Kidney failure/ Dialysis - CKD stage 4. Denies complaints or symptoms of Incontinence/dribbling. Immunological Denies complaints or symptoms of Hives, Itching. Integumentary (Skin) Complains or has symptoms of Wounds. Denies complaints or symptoms of Bleeding or bruising tendency, Breakdown, Swelling. Musculoskeletal Denies complaints or symptoms of Muscle Pain, Muscle Weakness. Neurologic Denies complaints or symptoms of Numbness/parasthesias, Focal/Weakness. Psychiatric Mckenzie Taylor, Mckenzie Taylor ([id number]) Denies complaints or symptoms of Anxiety, Claustrophobia. Objective Constitutional patient is hypertensive.. pulse regular and within target range for patient.Marland Kitchen respirations regular, non-labored and within target range for patient.Marland Kitchen temperature within target range for patient.. Well-nourished and well-hydrated in no acute distress. Vitals Time Taken: 1:28 PM, Height: 64 in, Source: Measured, Weight: 204 lbs, Source: Measured, BMI: 35, Temperature: 97.7 F, Pulse: 101 bpm, Respiratory Rate: 20 breaths/min, Blood Pressure: 135/74 mmHg. Eyes conjunctiva clear no eyelid edema noted. pupils equal round and reactive to light and accommodation. Ears, Nose, Mouth, and Throat no gross abnormality of ear auricles or external auditory canals. normal hearing noted during conversation. mucus membranes moist. Respiratory normal breathing without difficulty on oxygen which is standard for her. clear to auscultation bilaterally. Cardiovascular regular rate and rhythm with normal S1, S2. no clubbing,  cyanosis, significant edema, Gastrointestinal (GI) soft, non-tender, non-distended, +BS. Umbilical hernia noted on examination. Musculoskeletal Patient unable to walk without assistance. Psychiatric this patient is able to make decisions and demonstrates good insight into disease process. Alert and Oriented x 3. pleasant and cooperative. General Notes: Upon evaluation patient's wound bed actually did require some sharp debridement today to remove the necrotic tissue from the surface of the wound. She tolerated this without complication good news is post debridement the wound bed actually appear to be doing separately better which was excellent news. Overall I'm very pleased with how things seem to appear at this time the biggest issue and challenge I see is that this is a region of scar tissue which is gonna make it somewhat more difficult to completely healed quickly though we will definitely give this our best shot. Integumentary (Hair, Skin) Wound #1 status is Open. Original cause of wound was Gradually Appeared. The wound is located on the Left Abdomen - midline. The wound measures 1.4cm length x 2.3cm width x 0.1cm depth; 2.529cm^2 area and 0.253cm^3 volume. There is Fat Layer (Subcutaneous Tissue) Exposed exposed. There is no tunneling or undermining noted. There is a medium amount of serous drainage noted. The wound margin is flat and intact. There is small (1-33%) pink granulation within the wound bed. There is a large (67-100%) amount of necrotic tissue within the wound bed including Adherent Slough. The periwound skin appearance exhibited: Erythema. The periwound skin appearance did not exhibit: Callus, Crepitus, Excoriation, Induration, Rash, Scarring, Dry/Scaly, Maceration, Atrophie Blanche, Cyanosis, Ecchymosis, Hemosiderin Staining, Mottled, Pallor, Rubor. The surrounding wound skin color is noted with erythema which is circumferential. Periwound temperature was noted Mckenzie Taylor, Mckenzie Taylor. ([id number]) as No Abnormality. The periwound has tenderness on palpation. Assessment Active Problems ICD-10 Cutaneous abscess of abdominal wall Non-pressure chronic ulcer of skin of other sites with fat layer exposed Essential (primary) hypertension  Chronic combined systolic (congestive) and diastolic (congestive) heart failure Chronic obstructive pulmonary disease, unspecified Chronic kidney disease, stage 4 (severe) Procedures Wound #1 Pre-procedure diagnosis of Wound #1 is a Cyst located on the Left Abdomen - midline . There was a Excisional Skin/Subcutaneous Tissue Debridement with a total area of 3.22 sq cm performed by Mckenzie III, Annet Manukyan E., Taylor. With the following instrument(s): Curette to remove Non-Viable tissue/material. Material removed includes Subcutaneous Tissue and Slough and after achieving pain control using Lidocaine. No specimens were taken. A time out was conducted at 14:23, prior to the start of the procedure. A Minimum amount of bleeding was controlled with Pressure. The procedure was tolerated well with a pain level of 0 throughout and a pain level of 0 following the procedure. Post Debridement Measurements: 1.4cm length x 2.3cm width x 0.1cm depth; 0.253cm^3 volume. Character of Wound/Ulcer Post Debridement is improved. Post procedure Diagnosis Wound #1: Same as Pre-Procedure Plan Wound Cleansing: Wound #1 Left Abdomen - midline: May Shower, gently pat wound dry prior to applying new dressing. - Clean with Dial Antibacterial soap, rinse and pat dry. Primary Wound Dressing: Collagen Secondary Dressing: Wound #1 Left Abdomen - midline: Boardered Foam Dressing Dressing Change Frequency: Wound #1 Left Abdomen - midline: Change Dressing Monday, Wednesday, Friday Follow-up Appointments: Return Appointment in 1 week. Mckenzie Taylor, Mckenzie Taylor. ([id number]) My suggestion currently is gonna be that we go ahead and initiate the above wound care measures for the  next week. Patient is in agreement the plan. The good news is post debridement the wound bed appear to be doing much better. We will subsequently see were things stand at follow-up. If anything changes or worsens meantime shall contact the office and let us know. Please see above for specific wound care orders. We will see patient for re-evaluation in 1 week(s) here in the clinic. If anything worsens or changes patient will contact our office for additional recommendations. Electronic Signature(s) Signed: 05/02/2018 12:02:18 AM By: Mckenzie Keeler Taylor Entered By: Mckenzie Taylor on 05/01/2018 14:37:23 Mckenzie Taylor ([id number]) -------------------------------------------------------------------------------- ROS/PFSH Details Patient Name: Mckenzie Taylor. Date of Service: 05/01/2018 1:00 PM Medical Record Number: [id number] Patient Account Number: [id number] Date of Birth/Sex: [birth year] (77 y.o. F) Treating RN: Montey Hora Primary Care Provider: Denton Taylor Other Clinician: Referring Provider: Denton Taylor Treating Provider/Extender: Mckenzie Hake, Genevra Orne Weeks in Treatment: 0 Information Obtained From Patient Wound History Do you currently have one or more open woundso Yes How many open wounds do you currently haveo 1 Approximately how long have you had your woundso Nov 2019 How have you been treating your wound(s) until nowo silvadene and bandage Has your wound(s) ever healed and then re-openedo No Have you had any lab work done in the past montho No Have you tested positive for an antibiotic resistant organism (MRSA, VRE)o No Have you tested positive for osteomyelitis (bone infection)o No Have you had any tests for circulation on your legso No Constitutional Symptoms (General Health) Complaints and Symptoms: Negative for: Fatigue; Fever; Chills; Marked Weight Change Eyes Complaints and Symptoms: Negative for: Dry Eyes; Vision Changes; Glasses / Contacts Medical  History: Negative for: Cataracts; Glaucoma; Optic Neuritis Ear/Nose/Mouth/Throat Complaints and Symptoms: Negative for: Difficult clearing ears; Sinusitis Medical History: Negative for: Chronic sinus problems/congestion; Middle ear problems Hematologic/Lymphatic Complaints and Symptoms: Negative for: Bleeding / Clotting Disorders; Human Immunodeficiency Virus Medical History: Negative for: Anemia; Hemophilia; Human Immunodeficiency Virus; Lymphedema; Sickle Cell Disease Respiratory Complaints and Symptoms: Positive for: Shortness of Breath Negative for:  Chronic or frequent coughs Medical History: CASS, EDINGER ([id number]) Positive for: Chronic Obstructive Pulmonary Disease (COPD) Negative for: Aspiration; Asthma; Pneumothorax; Sleep Apnea; Tuberculosis Past Medical History Notes: home O2 at 3L Cardiovascular Complaints and Symptoms: Negative for: Chest pain; LE edema Medical History: Positive for: Congestive Heart Failure; Hypertension Negative for: Angina; Arrhythmia; Coronary Artery Disease; Deep Vein Thrombosis; Hypotension; Myocardial Infarction; Peripheral Arterial Disease; Peripheral Venous Disease; Phlebitis; Vasculitis Gastrointestinal Complaints and Symptoms: Negative for: Frequent diarrhea; Nausea; Vomiting Medical History: Negative for: Cirrhosis ; Colitis; Crohnos; Hepatitis A; Hepatitis B; Hepatitis C Endocrine Complaints and Symptoms: Negative for: Hepatitis; Thyroid disease; Polydypsia (Excessive Thirst) Medical History: Positive for: Type II Diabetes Negative for: Type I Diabetes Genitourinary Complaints and Symptoms: Positive for: Kidney failure/ Dialysis - CKD stage 4 Negative for: Incontinence/dribbling Medical History: Positive for: End Stage Renal Disease - CKD stage 4 Past Medical History Notes: polycystic kidneys Immunological Complaints and Symptoms: Negative for: Hives; Itching Medical History: Negative for: Lupus Erythematosus;  Raynaudos; Scleroderma Integumentary (Skin) Complaints and Symptoms: Positive for: Wounds Negative for: Bleeding or bruising tendency; Breakdown; Swelling Medical History: Mckenzie Taylor, Mckenzie Taylor ([id number]) Negative for: History of Burn; History of pressure wounds Musculoskeletal Complaints and Symptoms: Negative for: Muscle Pain; Muscle Weakness Medical History: Negative for: Gout; Rheumatoid Arthritis; Osteoarthritis; Osteomyelitis Neurologic Complaints and Symptoms: Negative for: Numbness/parasthesias; Focal/Weakness Medical History: Negative for: Dementia; Neuropathy; Quadriplegia; Paraplegia; Seizure Disorder Psychiatric Complaints and Symptoms: Negative for: Anxiety; Claustrophobia Medical History: Negative for: Anorexia/bulimia; Confinement Anxiety Oncologic Medical History: Negative for: Received Chemotherapy; Received Radiation Immunizations Pneumococcal Vaccine: Received Pneumococcal Vaccination: Yes Implantable Devices Family and Social History Cancer: Yes - Paternal Grandparents,Father,Child,Siblings; Heart Disease: Yes - Mother; Hereditary Spherocytosis: No; Hypertension: Yes - Mother; Kidney Disease: No; Lung Disease: Yes - Mother; Seizures: No; Stroke: Yes - Mother,Child; Thyroid Problems: No; Tuberculosis: No; Former smoker - 2016; Marital Status - Separated; Alcohol Use: Never; Drug Use: No History; Caffeine Use: Daily; Financial Concerns: No; Food, Clothing or Shelter Needs: No; Support System Lacking: No; Transportation Concerns: No; Advanced Directives: No; Patient does not want information on Advanced Directives Electronic Signature(s) Signed: 05/02/2018 12:02:18 AM By: Mckenzie Keeler Taylor Signed: 05/02/2018 4:18:49 PM By: Montey Hora Entered By: Montey Hora on 05/01/2018 13:41:30 Mckenzie Taylor ([id number]) -------------------------------------------------------------------------------- SuperBill Details Patient Name: Mckenzie Taylor. Date of  Service: 05/01/2018 Medical Record Number: [id number] Patient Account Number: [id number] Date of Birth/Sex: 03-05-1941 (77 y.o. 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, stage 4 (severe) Facility Procedures CPT4 Code: 66599357 Description: 99213 - WOUND CARE VISIT-LEV 3 EST PT Modifier: Quantity: 1 CPT4 Code: 01779390 Description: 11042 - DEB SUBQ TISSUE 20 SQ CM/< ICD-10 Diagnosis Description L98.492 Non-pressure chronic ulcer of skin of other sites with fat la Modifier: yer exposed Quantity: 1 Physician Procedures CPT4 Code Description: 3009233 WC PHYS LEVEL 3 o NEW PT ICD-10 Diagnosis Description L02.211 Cutaneous abscess of abdominal wall L98.492 Non-pressure chronic ulcer of skin of other sites with fat lay I50.42 Chronic combined systolic (congestive) and  diastolic (congesti A07 Essential (primary) hypertension Modifier: 25 er exposed ve) heart failur Quantity: 1 e CPT4 Code Description: 6226333  11042 - WC PHYS SUBQ TISS 20 SQ CM ICD-10 Diagnosis Description L98.492 Non-pressure chronic ulcer of skin of other sites with fat lay Modifier: er exposed Quantity: 1 Electronic Signature(s) Signed: 05/02/2018 12:02:18 AM By: Mckenzie Keeler Taylor Entered By: Mckenzie Taylor on 05/01/2018 14:37:43

## 2018-05-08 ENCOUNTER — Ambulatory Visit: Payer: Medicare Other | Admitting: Physician Assistant

## 2018-05-15 ENCOUNTER — Ambulatory Visit: Payer: Medicare Other | Admitting: Family Medicine

## 2018-05-22 ENCOUNTER — Encounter: Payer: Medicare Other | Attending: Physician Assistant | Admitting: Physician Assistant

## 2018-05-22 DIAGNOSIS — Z87891 Personal history of nicotine dependence: Secondary | ICD-10-CM | POA: Insufficient documentation

## 2018-05-22 DIAGNOSIS — J449 Chronic obstructive pulmonary disease, unspecified: Secondary | ICD-10-CM | POA: Insufficient documentation

## 2018-05-22 DIAGNOSIS — L98492 Non-pressure chronic ulcer of skin of other sites with fat layer exposed: Secondary | ICD-10-CM | POA: Diagnosis present

## 2018-05-22 DIAGNOSIS — I5042 Chronic combined systolic (congestive) and diastolic (congestive) heart failure: Secondary | ICD-10-CM | POA: Diagnosis not present

## 2018-05-22 DIAGNOSIS — Z9981 Dependence on supplemental oxygen: Secondary | ICD-10-CM | POA: Insufficient documentation

## 2018-05-22 DIAGNOSIS — E1122 Type 2 diabetes mellitus with diabetic chronic kidney disease: Secondary | ICD-10-CM | POA: Diagnosis not present

## 2018-05-22 DIAGNOSIS — I132 Hypertensive heart and chronic kidney disease with heart failure and with stage 5 chronic kidney disease, or end stage renal disease: Secondary | ICD-10-CM | POA: Diagnosis not present

## 2018-05-22 DIAGNOSIS — N186 End stage renal disease: Secondary | ICD-10-CM | POA: Diagnosis not present

## 2018-05-23 NOTE — Progress Notes (Addendum)
Mckenzie, Taylor ([id number]) Visit Report for 05/22/2018 Arrival Information Details Patient Name: Mckenzie Taylor, Mckenzie Taylor. Date of Service: 05/22/2018 1:45 PM Medical Record Number: [id number] Patient Account Number: [id number] Date of Birth/Sex: [birth year] (78 y.o. F) Treating RN: Montey Hora Primary Care Puanani Gene: Denton Lank Other Clinician: Referring Jamori Biggar: Denton Lank Treating Bonifacio Pruden/Extender: Melburn Hake, HOYT Weeks in Treatment: 3 Visit Information History Since Last Visit Added or deleted any medications: No Patient Arrived: Ambulatory Any new allergies or adverse reactions: No Arrival Time: 14:10 Had a fall or experienced change in No Accompanied By: spouse activities of daily living that may affect Transfer Assistance: None risk of falls: Patient Identification Verified: Yes Signs or symptoms of abuse/neglect since last visito No Secondary Verification Process Completed: Yes Hospitalized since last visit: No Patient Has Alerts: Yes Implantable device outside of the clinic excluding No Patient Alerts: DMII cellular tissue based products placed in the center since last visit: Has Dressing in Place as Prescribed: Yes Pain Present Now: No Electronic Signature(s) Signed: 05/22/2018 4:27:26 PM By: Montey Hora Entered By: Montey Hora on 05/22/2018 14:10:36 Mckenzie Taylor ([id number]) -------------------------------------------------------------------------------- Clinic Level of Care Assessment Details Patient Name: Mckenzie Taylor. Date of Service: 05/22/2018 1:45 PM Medical Record Number: [id number] Patient Account Number: [id number] Date of Birth/Sex: 26-Feb-1941 (78 y.o. F) Treating RN: Harold Barban Primary Care Deronda Christian: Denton Lank Other Clinician: Referring Leelynd Maldonado: Denton Lank Treating Lakendra Helling/Extender: Melburn Hake, HOYT Weeks in Treatment: 3 Clinic Level of Care Assessment Items TOOL 4 Quantity Score []  - Use when only an EandM is performed  on FOLLOW-UP visit 0 ASSESSMENTS - Nursing Assessment / Reassessment []  - Reassessment of Co-morbidities (includes updates in patient status) 0 []  - 0 Reassessment of Adherence to Treatment Plan ASSESSMENTS - Wound and Skin Assessment / Reassessment X - Simple Wound Assessment / Reassessment - one wound 1 5 []  - 0 Complex Wound Assessment / Reassessment - multiple wounds []  - 0 Dermatologic / Skin Assessment (not related to wound area) ASSESSMENTS - Focused Assessment []  - Circumferential Edema Measurements - multi extremities 0 []  - 0 Nutritional Assessment / Counseling / Intervention []  - 0 Lower Extremity Assessment (monofilament, tuning fork, pulses) []  - 0 Peripheral Arterial Disease Assessment (using hand held doppler) ASSESSMENTS - Ostomy and/or Continence Assessment and Care []  - Incontinence Assessment and Management 0 []  - 0 Ostomy Care Assessment and Management (repouching, etc.) PROCESS - Coordination of Care X - Simple Patient / Family Education for ongoing care 1 15 []  - 0 Complex (extensive) Patient / Family Education for ongoing care []  - 0 Staff obtains Programmer, systems, Records, Test Results / Process Orders []  - 0 Staff telephones HHA, Nursing Homes / Clarify orders / etc []  - 0 Routine Transfer to another Facility (non-emergent condition) []  - 0 Routine Hospital Admission (non-emergent condition) []  - 0 New Admissions / Biomedical engineer / Ordering NPWT, Apligraf, etc. []  - 0 Emergency Hospital Admission (emergent condition) X- 1 10 Simple Discharge Coordination ISALY, FASCHING ([id number]) []  - 0 Complex (extensive) Discharge Coordination PROCESS - Special Needs []  - Pediatric / Minor Patient Management 0 []  - 0 Isolation Patient Management []  - 0 Hearing / Language / Visual special needs []  - 0 Assessment of Community assistance (transportation, D/C planning, etc.) []  - 0 Additional assistance / Altered mentation []  - 0 Support Surface(s)  Assessment (bed, cushion, seat, etc.) INTERVENTIONS - Wound Cleansing / Measurement X - Simple Wound Cleansing - one wound 1 5 []  - 0 Complex Wound Cleansing - multiple  wounds X- 1 5 Wound Imaging (photographs - any number of wounds) []  - 0 Wound Tracing (instead of photographs) X- 1 5 Simple Wound Measurement - one wound []  - 0 Complex Wound Measurement - multiple wounds INTERVENTIONS - Wound Dressings X - Small Wound Dressing one or multiple wounds 1 10 []  - 0 Medium Wound Dressing one or multiple wounds []  - 0 Large Wound Dressing one or multiple wounds []  - 0 Application of Medications - topical []  - 0 Application of Medications - injection INTERVENTIONS - Miscellaneous []  - External ear exam 0 []  - 0 Specimen Collection (cultures, biopsies, blood, body fluids, etc.) []  - 0 Specimen(s) / Culture(s) sent or taken to Lab for analysis []  - 0 Patient Transfer (multiple staff / Civil Service fast streamer / Similar devices) []  - 0 Simple Staple / Suture removal (25 or less) []  - 0 Complex Staple / Suture removal (26 or more) []  - 0 Hypo / Hyperglycemic Management (close monitor of Blood Glucose) []  - 0 Ankle / Brachial Index (ABI) - do not check if billed separately X- 1 5 Vital Signs HILLARY, STRUSS ([id number]) Has the patient been seen at the hospital within the last three years: Yes Total Score: 60 Level Of Care: New/Established - Level 2 Electronic Signature(s) Signed: 05/22/2018 5:00:09 PM By: Harold Barban Entered By: Harold Barban on 05/22/2018 15:03:22 Mckenzie Taylor ([id number]) -------------------------------------------------------------------------------- Encounter Discharge Information Details Patient Name: Mckenzie Taylor. Date of Service: 05/22/2018 1:45 PM Medical Record Number: [id number] Patient Account Number: [id number] Date of Birth/Sex: 1941/02/24 (78 y.o. F) Treating RN: Harold Barban Primary Care Honey Zakarian: Denton Lank Other  Clinician: Referring Reshunda Strider: Denton Lank Treating Zoua Caporaso/Extender: Sharalyn Ink in Treatment: 3 Encounter Discharge Information Items Schedule Follow-up Appointment: No Clinical Summary of Care: Provided on 05/22/2018 Form Type Recipient Paper Patient gc Electronic Signature(s) Signed: 05/22/2018 3:28:34 PM By: Lorine Bears RCP, RRT, CHT Entered By: Becky Sax, Amado Nash on 05/22/2018 15:17:55 Mckenzie Taylor ([id number]) -------------------------------------------------------------------------------- Lower Extremity Assessment Details Patient Name: VEVA, GRIMLEY. Date of Service: 05/22/2018 1:45 PM Medical Record Number: [id number] Patient Account Number: [id number] Date of Birth/Sex: January 11, 1941 (77 y.o. F) Treating RN: Montey Hora Primary Care Halston Fairclough: Denton Lank Other Clinician: Referring Halle Davlin: Denton Lank Treating Arjun Hard/Extender: Melburn Hake, HOYT Weeks in Treatment: 3 Electronic Signature(s) Signed: 05/22/2018 4:27:26 PM By: Montey Hora Entered By: Montey Hora on 05/22/2018 14:14:58 Mckenzie Taylor ([id number]) -------------------------------------------------------------------------------- Multi Wound Chart Details Patient Name: Mckenzie Taylor. Date of Service: 05/22/2018 1:45 PM Medical Record Number: [id number] Patient Account Number: [id number] Date of Birth/Sex: 31-Mar-1940 (77 y.o. F) Treating RN: Harold Barban Primary Care Madaline Lefeber: Denton Lank Other Clinician: Referring Cabell Lazenby: Denton Lank Treating Kashana Breach/Extender: Melburn Hake, HOYT Weeks in Treatment: 3 Vital Signs Height(in): 64 Pulse(bpm): 106 Weight(lbs): 204 Blood Pressure(mmHg): 125/66 Body Mass Index(BMI): 35 Temperature(F): 97.6 Respiratory Rate 16 (breaths/min): Photos: [N/A:N/A] Wound Location: Left Abdomen - midline N/A N/A Wounding Event: Gradually Appeared N/A N/A Primary Etiology: Cyst N/A N/A Comorbid History: Chronic  Obstructive N/A N/A Pulmonary Disease (COPD), Congestive Heart Failure, Hypertension, Type II Diabetes, End Stage Renal Disease Date Acquired: 01/23/2018 N/A N/A Weeks of Treatment: 3 N/A N/A Wound Status: Open N/A N/A Measurements L x W x D 0.7x1.7x0.1 N/A N/A (cm) Area (cm) : 0.935 N/A N/A Volume (cm) : 0.093 N/A N/A % Reduction in Area: 63.00% N/A N/A % Reduction in Volume: 63.20% N/A N/A Classification: Full Thickness Without N/A N/A Exposed Support Structures Exudate Amount: Medium N/A N/A Exudate Type:  Serous N/A N/A Exudate Color: amber N/A N/A Wound Margin: Flat and Intact N/A N/A Granulation Amount: Large (67-100%) N/A N/A Granulation Quality: Pink N/A N/A Necrotic Amount: Small (1-33%) N/A N/A Exposed Structures: Fat Layer (Subcutaneous N/A N/A Tissue) Exposed: Yes SAORY, CARRIERO ([id number]) Fascia: No Tendon: No Muscle: No Joint: No Bone: No Epithelialization: Small (1-33%) N/A N/A Erythema Location: Circumferential N/A N/A Wound Preparation: Ulcer Cleansing: N/A N/A Rinsed/Irrigated with Saline Topical Anesthetic Applied: Other: lidocaine 4% Treatment Notes Electronic Signature(s) Signed: 05/22/2018 5:00:09 PM By: Harold Barban Entered By: Harold Barban on 05/22/2018 15:00:32 Mckenzie Taylor ([id number]) -------------------------------------------------------------------------------- [place] Details Patient Name: JERZIE, BIERI. Date of Service: 05/22/2018 1:45 PM Medical Record Number: [id number] Patient Account Number: [id number] Date of Birth/Sex: 09-16-1940 (77 y.o. F) Treating RN: Harold Barban Primary Care Maryann Mccall: Denton Lank Other Clinician: Referring Keary Hanak: Denton Lank Treating Braden Cimo/Extender: Melburn Hake, HOYT Weeks in Treatment: 3 Active Inactive Electronic Signature(s) Signed: 09/26/2018 11:13:11 AM By: Montey Hora Signed: 11/10/2018 1:02:36 PM By: Harold Barban Previous Signature:  05/22/2018 5:00:09 PM Version By: Harold Barban Entered By: Montey Hora on 09/26/2018 11:13:11 Mckenzie Taylor ([id number]) -------------------------------------------------------------------------------- Pain Assessment Details Patient Name: BERDELL, NEVITT. Date of Service: 05/22/2018 1:45 PM Medical Record Number: [id number] Patient Account Number: [id number] Date of Birth/Sex: March 06, 1941 (77 y.o. F) Treating RN: Montey Hora Primary Care Elliemae Braman: Denton Lank Other Clinician: Referring Bethzy Hauck: Denton Lank Treating Lisa-Marie Rueger/Extender: Melburn Hake, HOYT Weeks in Treatment: 3 Active Problems Location of Pain Severity and Description of Pain Patient Has Paino No Site Locations Pain Management and Medication Current Pain Management: Electronic Signature(s) Signed: 05/22/2018 4:27:26 PM By: Montey Hora Entered By: Montey Hora on 05/22/2018 14:11:35 Mckenzie Taylor ([id number]) -------------------------------------------------------------------------------- Patient/Caregiver Education Details Patient Name: Mckenzie Taylor. Date of Service: 05/22/2018 1:45 PM Medical Record Number: [id number] Patient Account Number: [id number] Date of Birth/Gender: 1940-09-18 (77 y.o. F) Treating RN: Harold Barban Primary Care Physician: Denton Lank Other Clinician: Referring Physician: Denton Lank Treating Physician/Extender: Sharalyn Ink in Treatment: 3 Education Assessment Education Provided To: Patient Education Topics Provided Wound/Skin Impairment: Handouts: Caring for Your Ulcer Methods: Demonstration, Explain/Verbal Responses: State content correctly Electronic Signature(s) Signed: 05/22/2018 5:00:09 PM By: Harold Barban Entered By: Harold Barban on 05/22/2018 15:00:51 Mckenzie Taylor ([id number]) -------------------------------------------------------------------------------- Wound Assessment Details Patient Name: Mckenzie Taylor. Date of  Service: 05/22/2018 1:45 PM Medical Record Number: [id number] Patient Account Number: [id number] Date of Birth/Sex: [birth year] (77 y.o. F) Treating RN: Montey Hora Primary Care Jesika Men: Denton Lank Other Clinician: Referring Danila Eddie: Denton Lank Treating Lavoris Canizales/Extender: Melburn Hake, HOYT Weeks in Treatment: 3 Wound Status Wound Number: 1 Primary Cyst Etiology: Wound Location: Left Abdomen - midline Wound Open Wounding Event: Gradually Appeared Status: Date Acquired: 01/23/2018 Comorbid Chronic Obstructive Pulmonary Disease Weeks Of Treatment: 3 History: (COPD), Congestive Heart Failure, Clustered Wound: No Hypertension, Type II Diabetes, End Stage Renal Disease Photos Photo Uploaded By: Montey Hora on 05/22/2018 14:46:46 Wound Measurements Length: (cm) 0.7 Width: (cm) 1.7 Depth: (cm) 0.1 Area: (cm) 0.935 Volume: (cm) 0.093 % Reduction in Area: 63% % Reduction in Volume: 63.2% Epithelialization: Small (1-33%) Tunneling: No Undermining: No Wound Description Full Thickness Without Exposed Support Classification: Structures Wound Margin: Flat and Intact Exudate Medium Amount: Exudate Type: Serous Exudate Color: amber Foul Odor After Cleansing: No Slough/Fibrino Yes Wound Bed Granulation Amount: Large (67-100%) Exposed Structure Granulation Quality: Pink Fascia Exposed: No Necrotic Amount: Small (1-33%) Fat Layer (Subcutaneous Tissue) Exposed: Yes Necrotic Quality: Adherent Slough Tendon Exposed: No Muscle  Exposed: No Joint Exposed: No RASHIKA, BETTES. ([id number]) Bone Exposed: No Periwound Skin Texture Texture Color No Abnormalities Noted: No No Abnormalities Noted: No Callus: No Atrophie Blanche: No Crepitus: No Cyanosis: No Excoriation: No Ecchymosis: No Induration: No Erythema: Yes Rash: No Erythema Location: Circumferential Scarring: No Hemosiderin Staining: No Mottled: No Moisture Pallor: No No Abnormalities Noted: No Rubor:  No Dry / Scaly: No Maceration: No Temperature / Pain Temperature: No Abnormality Tenderness on Palpation: Yes Wound Preparation Ulcer Cleansing: Rinsed/Irrigated with Saline Topical Anesthetic Applied: Other: lidocaine 4%, Electronic Signature(s) Signed: 05/22/2018 4:27:26 PM By: Montey Hora Entered By: Montey Hora on 05/22/2018 14:14:51 Mckenzie Taylor ([id number]) -------------------------------------------------------------------------------- Vitals Details Patient Name: Mckenzie Taylor. Date of Service: 05/22/2018 1:45 PM Medical Record Number: [id number] Patient Account Number: [id number] Date of Birth/Sex: May 04, 1940 (77 y.o. F) Treating RN: Montey Hora Primary Care Mattilyn Crites: Denton Lank Other Clinician: Referring Wendelin Reader: Denton Lank Treating Myrna Vonseggern/Extender: Melburn Hake, HOYT Weeks in Treatment: 3 Vital Signs Time Taken: 14:11 Temperature (F): 97.6 Height (in): 64 Pulse (bpm): 106 Weight (lbs): 204 Respiratory Rate (breaths/min): 16 Body Mass Index (BMI): 35 Blood Pressure (mmHg): 125/66 Reference Range: 80 - 120 mg / dl Electronic Signature(s) Signed: 05/22/2018 4:27:26 PM By: Montey Hora Entered By: Montey Hora on 05/22/2018 14:11:57

## 2018-05-23 NOTE — Progress Notes (Signed)
[place], STRINGFIELD ([id number]) Visit Report for 05/22/2018 Chief Complaint Document Details Patient Name: Mckenzie Taylor, Mckenzie Taylor. Date of Service: 05/22/2018 1:45 PM Medical Record Number: [id number] Patient Account Number: [id number] Date of Birth/Sex: [birth year] (77 y.o. F) Treating RN: Harold Barban Primary Care Provider: Denton Lank Other Clinician: Referring Provider: Denton Lank Treating Provider/Extender: Melburn Hake, Dominick Morella Weeks in Treatment: 3 Information Obtained from: Patient Chief Complaint Left abdominal wall ulcer Electronic Signature(s) Signed: 05/22/2018 5:00:45 PM By: Worthy Keeler PA-C Entered By: Worthy Keeler on 05/22/2018 13:46:10 Mckenzie Taylor ([id number]) -------------------------------------------------------------------------------- HPI Details Patient Name: Mckenzie Taylor. Date of Service: 05/22/2018 1:45 PM Medical Record Number: [id number] Patient Account Number: [id number] Date of Birth/Sex: 1941-02-11 (77 y.o. F) Treating RN: Harold Barban Primary Care Provider: Denton Lank Other Clinician: Referring Provider: Denton Lank Treating Provider/Extender: Sharalyn Ink in Treatment: 3 History of Present Illness HPI Description: 05/01/18 on evaluation today patient presents for initial inspection in our clinic concerning an issue that she had with the two teeniest abscess over the abdominal region which occurred back in November 2019. Subsequently she was initially given [place] although more recently they mainly just been using peroxide and covering this with the bandage at home. She was given some Silvadene that has not really seem to make a big difference for her. Fortunately there's no signs of infection at this time which is excellent news. She continues to cleans this with peroxide pretty much daily. Other than this patient does have a history of hypertension, congestive heart failure, COPD, and chronic kidney disease stage IV. No  fevers, chills, nausea, or vomiting noted at this time. Patient's hemoglobin A1c was 9.3 as measured last on 02/04/18. 05/22/18 on evaluation today patient actually appears to be doing much better in regard to her down the wound compared to when I last saw her. Fortunately there's no evidence of infection at this time overall she's been tolerating the dressing changes without complication. No fevers, chills, nausea, or vomiting noted at this time. Electronic Signature(s) Signed: 05/22/2018 5:00:45 PM By: Worthy Keeler PA-C Entered By: Worthy Keeler on 05/22/2018 16:39:28 Mckenzie Taylor ([id number]) -------------------------------------------------------------------------------- Physical Exam Details Patient Name: Mckenzie Taylor. Date of Service: 05/22/2018 1:45 PM Medical Record Number: [id number] Patient Account Number: [id number] Date of Birth/Sex: [birth year] (77 y.o. F) Treating RN: Harold Barban Primary Care Provider: Denton Lank Other Clinician: Referring Provider: Denton Lank Treating Provider/Extender: Melburn Hake, Karyl Sharrar Weeks in Treatment: 3 Constitutional Well-nourished and well-hydrated in no acute distress. Respiratory normal breathing without difficulty. clear to auscultation bilaterally. Cardiovascular regular rate and rhythm with normal S1, S2. Psychiatric this patient is able to make decisions and demonstrates good insight into disease process. Alert and Oriented x 3. pleasant and cooperative. Notes The patient does have a small area on the Central to left lateral portion of the wound where there was a small hole and when I probe this further there actually appear to be a little piece of calcium deposit in this hold. That was removed and once removed there was some bleeding but fortunately this did not appear to be doing too poorly. I think this will actually allow the area to hopefully completely healed much more rapidly and quickly. Electronic  Signature(s) Signed: 05/22/2018 5:00:45 PM By: Worthy Keeler PA-C Entered By: Worthy Keeler on 05/22/2018 16:40:02 Mckenzie Taylor ([id number]) -------------------------------------------------------------------------------- Physician Orders Details Patient Name: Mckenzie Taylor. Date of Service: 05/22/2018 1:45 PM Medical Record Number: [id number] Patient Account Number:  [id number] Date of Birth/Sex: [birth year] (78 y.o. F) Treating RN: Harold Barban Primary Care Provider: Denton Lank Other Clinician: Referring Provider: Denton Lank Treating Provider/Extender: Melburn Hake, Carson Bogden Weeks in Treatment: 3 Verbal / Phone Orders: No Diagnosis Coding ICD-10 Coding Code Description L02.211 Cutaneous abscess of abdominal wall L98.492 Non-pressure chronic ulcer of skin of other sites with fat layer exposed I10 Essential (primary) hypertension I50.42 Chronic combined systolic (congestive) and diastolic (congestive) heart failure J44.9 Chronic obstructive pulmonary disease, unspecified N18.4 Chronic kidney disease, stage 4 (severe) Wound Cleansing Wound #1 Left Abdomen - midline o May Shower, gently pat wound dry prior to applying new dressing. - Clean with Dial Antibacterial soap, rinse and pat dry. Primary Wound Dressing o Silver Collagen Secondary Dressing Wound #1 Left Abdomen - midline o Boardered Foam Dressing Dressing Change Frequency Wound #1 Left Abdomen - midline o Change Dressing Monday, Wednesday, Friday Follow-up Appointments o Return Appointment in 2 weeks. Electronic Signature(s) Signed: 05/22/2018 5:00:09 PM By: Harold Barban Signed: 05/22/2018 5:00:45 PM By: Worthy Keeler PA-C Entered By: Harold Barban on 05/22/2018 15:04:11 Mckenzie Taylor ([id number]) -------------------------------------------------------------------------------- Problem List Details Patient Name: Mckenzie Taylor. Date of Service: 05/22/2018 1:45 PM Medical Record Number:  [id number] Patient Account Number: [id number] Date of Birth/Sex: 04/20/1940 (77 y.o. F) Treating RN: Harold Barban Primary Care Provider: Denton Lank Other Clinician: Referring Provider: Denton Lank Treating Provider/Extender: Sharalyn Ink in Treatment: 3 Active Problems ICD-10 Evaluated Encounter Code Description Active Date Today Diagnosis L02.211 Cutaneous abscess of abdominal wall 05/01/2018 No Yes L98.492 Non-pressure chronic ulcer of skin of other sites with fat layer 05/01/2018 No Yes exposed I10 Essential (primary) hypertension 05/01/2018 No Yes I50.42 Chronic combined systolic (congestive) and diastolic 3/47/4259 No Yes (congestive) heart failure J44.9 Chronic obstructive pulmonary disease, unspecified 05/01/2018 No Yes N18.4 Chronic kidney disease, stage 4 (severe) 05/01/2018 No Yes Inactive Problems Resolved Problems Electronic Signature(s) Signed: 05/22/2018 5:00:45 PM By: Worthy Keeler PA-C Entered By: Worthy Keeler on 05/22/2018 13:46:04 Mckenzie Taylor ([id number]) -------------------------------------------------------------------------------- Progress Note Details Patient Name: Mckenzie Taylor. Date of Service: 05/22/2018 1:45 PM Medical Record Number: [id number] Patient Account Number: [id number] Date of Birth/Sex: January 13, 1941 (77 y.o. F) Treating RN: Harold Barban Primary Care Provider: Denton Lank Other Clinician: Referring Provider: Denton Lank Treating Provider/Extender: Sharalyn Ink in Treatment: 3 Subjective Chief Complaint Information obtained from Patient Left abdominal wall ulcer History of Present Illness (HPI) 05/01/18 on evaluation today patient presents for initial inspection in our clinic concerning an issue that she had with the two teeniest abscess over the abdominal region which occurred back in November 2019. Subsequently she was initially given [place] although more recently they mainly just been using  peroxide and covering this with the bandage at home. She was given some Silvadene that has not really seem to make a big difference for her. Fortunately there's no signs of infection at this time which is excellent news. She continues to cleans this with peroxide pretty much daily. Other than this patient does have a history of hypertension, congestive heart failure, COPD, and chronic kidney disease stage IV. No fevers, chills, nausea, or vomiting noted at this time. Patient's hemoglobin A1c was 9.3 as measured last on 02/04/18. 05/22/18 on evaluation today patient actually appears to be doing much better in regard to her down the wound compared to when I last saw her. Fortunately there's no evidence of infection at this time overall she's been tolerating the dressing changes without complication. No fevers,  chills, nausea, or vomiting noted at this time. Patient History Information obtained from Patient. Family History Cancer - Paternal Grandparents,Father,Child,Siblings, Heart Disease - Mother, Hypertension - Mother, Lung Disease - Mother, Stroke - Mother,Child, No family history of Hereditary Spherocytosis, Kidney Disease, Seizures, Thyroid Problems, Tuberculosis. Social History Former smoker - 2016, Marital Status - Separated, Alcohol Use - Never, Drug Use - No History, Caffeine Use - Daily. Medical History Eyes Denies history of Cataracts, Glaucoma, Optic Neuritis Ear/Nose/Mouth/Throat Denies history of Chronic sinus problems/congestion, Middle ear problems Hematologic/Lymphatic Denies history of Anemia, Hemophilia, Human Immunodeficiency Virus, Lymphedema, Sickle Cell Disease Respiratory Patient has history of Chronic Obstructive Pulmonary Disease (COPD) Denies history of Aspiration, Asthma, Pneumothorax, Sleep Apnea, Tuberculosis Cardiovascular Patient has history of Congestive Heart Failure, Hypertension Denies history of Angina, Arrhythmia, Coronary Artery Disease, Deep Vein  Thrombosis, Hypotension, Myocardial Infarction, Peripheral Arterial Disease, Peripheral Venous Disease, Phlebitis, Vasculitis Gastrointestinal Denies history of Cirrhosis , Colitis, Crohn s, Hepatitis A, Hepatitis B, Hepatitis C Endocrine LOVELLA, HARDIE ([id number]) Patient has history of Type II Diabetes Denies history of Type I Diabetes Genitourinary Patient has history of End Stage Renal Disease - CKD stage 4 Immunological Denies history of Lupus Erythematosus, Raynaud s, Scleroderma Integumentary (Skin) Denies history of History of Burn, History of pressure wounds Musculoskeletal Denies history of Gout, Rheumatoid Arthritis, Osteoarthritis, Osteomyelitis Neurologic Denies history of Dementia, Neuropathy, Quadriplegia, Paraplegia, Seizure Disorder Oncologic Denies history of Received Chemotherapy, Received Radiation Psychiatric Denies history of Anorexia/bulimia, Confinement Anxiety Medical And Surgical History Notes Respiratory home O2 at 3L Genitourinary polycystic kidneys Review of Systems (ROS) Constitutional Symptoms (General Health) Denies complaints or symptoms of Fever, Chills. Respiratory The patient has no complaints or symptoms. Cardiovascular The patient has no complaints or symptoms. Psychiatric The patient has no complaints or symptoms. Objective Constitutional Well-nourished and well-hydrated in no acute distress. Vitals Time Taken: 2:11 PM, Height: 64 in, Weight: 204 lbs, BMI: 35, Temperature: 97.6 F, Pulse: 106 bpm, Respiratory Rate: 16 breaths/min, Blood Pressure: 125/66 mmHg. Respiratory normal breathing without difficulty. clear to auscultation bilaterally. Cardiovascular regular rate and rhythm with normal S1, S2. Psychiatric this patient is able to make decisions and demonstrates good insight into disease process. Alert and Oriented x 3. pleasant and cooperative. BRANDILEE, PIES ([id number]) General Notes: The patient does have a  small area on the Central to left lateral portion of the wound where there was a small hole and when I probe this further there actually appear to be a little piece of calcium deposit in this hold. That was removed and once removed there was some bleeding but fortunately this did not appear to be doing too poorly. I think this will actually allow the area to hopefully completely healed much more rapidly and quickly. Integumentary (Hair, Skin) Wound #1 status is Open. Original cause of wound was Gradually Appeared. The wound is located on the Left Abdomen - midline. The wound measures 0.7cm length x 1.7cm width x 0.1cm depth; 0.935cm^2 area and 0.093cm^3 volume. There is Fat Layer (Subcutaneous Tissue) Exposed exposed. There is no tunneling or undermining noted. There is a medium amount of serous drainage noted. The wound margin is flat and intact. There is large (67-100%) pink granulation within the wound bed. There is a small (1-33%) amount of necrotic tissue within the wound bed including Adherent Slough. The periwound skin appearance exhibited: Erythema. The periwound skin appearance did not exhibit: Callus, Crepitus, Excoriation, Induration, Rash, Scarring, Dry/Scaly, Maceration, Atrophie Blanche, Cyanosis, Ecchymosis, Hemosiderin Staining, Mottled, Pallor, Rubor. The  surrounding wound skin color is noted with erythema which is circumferential. Periwound temperature was noted as No Abnormality. The periwound has tenderness on palpation. Assessment Active Problems ICD-10 Cutaneous abscess of abdominal wall Non-pressure chronic ulcer of skin of other sites with fat layer exposed Essential (primary) hypertension Chronic combined systolic (congestive) and diastolic (congestive) heart failure Chronic obstructive pulmonary disease, unspecified Chronic kidney disease, stage 4 (severe) Plan Wound Cleansing: Wound #1 Left Abdomen - midline: May Shower, gently pat wound dry prior to applying new  dressing. - Clean with Dial Antibacterial soap, rinse and pat dry. Primary Wound Dressing: Silver Collagen Secondary Dressing: Wound #1 Left Abdomen - midline: Boardered Foam Dressing Dressing Change Frequency: Wound #1 Left Abdomen - midline: Change Dressing Monday, Wednesday, Friday Follow-up Appointments: Return Appointment in 2 weeks. THENA, DEVORA. ([id number]) My suggestion currently is gonna be that we go ahead and continue with the above wound care measures for the next week. Patient is in agreement the plan. I will see her back for reevaluation to see were things stand. Please see above for specific wound care orders. We will see patient for re-evaluation in 2 week(s) here in the clinic. If anything worsens or changes patient will contact our office for additional recommendations. Electronic Signature(s) Signed: 05/22/2018 5:00:45 PM By: Worthy Keeler PA-C Entered By: Worthy Keeler on 05/22/2018 16:40:18 Mckenzie Taylor ([id number]) -------------------------------------------------------------------------------- ROS/PFSH Details Patient Name: Mckenzie Taylor. Date of Service: 05/22/2018 1:45 PM Medical Record Number: [id number] Patient Account Number: [id number] Date of Birth/Sex: [birth year] (77 y.o. F) Treating RN: Harold Barban Primary Care Provider: Denton Lank Other Clinician: Referring Provider: Denton Lank Treating Provider/Extender: Melburn Hake, Bonnetta Allbee Weeks in Treatment: 3 Information Obtained From Patient Wound History Do you currently have one or more open woundso Yes How many open wounds do you currently haveo 1 Approximately how long have you had your woundso Nov 2019 How have you been treating your wound(s) until nowo silvadene and bandage Has your wound(s) ever healed and then re-openedo No Have you had any lab work done in the past montho No Have you tested positive for an antibiotic resistant organism (MRSA, VRE)o No Have you tested positive  for osteomyelitis (bone infection)o No Have you had any tests for circulation on your legso No Constitutional Symptoms (General Health) Complaints and Symptoms: Negative for: Fever; Chills Eyes Medical History: Negative for: Cataracts; Glaucoma; Optic Neuritis Ear/Nose/Mouth/Throat Medical History: Negative for: Chronic sinus problems/congestion; Middle ear problems Hematologic/Lymphatic Medical History: Negative for: Anemia; Hemophilia; Human Immunodeficiency Virus; Lymphedema; Sickle Cell Disease Respiratory Complaints and Symptoms: No Complaints or Symptoms Medical History: Positive for: Chronic Obstructive Pulmonary Disease (COPD) Negative for: Aspiration; Asthma; Pneumothorax; Sleep Apnea; Tuberculosis Past Medical History Notes: home O2 at 3L Cardiovascular Complaints and Symptoms: No Complaints or Symptoms CURSTIN, SCHMALE ([id number]) Medical History: Positive for: Congestive Heart Failure; Hypertension Negative for: Angina; Arrhythmia; Coronary Artery Disease; Deep Vein Thrombosis; Hypotension; Myocardial Infarction; Peripheral Arterial Disease; Peripheral Venous Disease; Phlebitis; Vasculitis Gastrointestinal Medical History: Negative for: Cirrhosis ; Colitis; Crohnos; Hepatitis A; Hepatitis B; Hepatitis C Endocrine Medical History: Positive for: Type II Diabetes Negative for: Type I Diabetes Genitourinary Medical History: Positive for: End Stage Renal Disease - CKD stage 4 Past Medical History Notes: polycystic kidneys Immunological Medical History: Negative for: Lupus Erythematosus; Raynaudos; Scleroderma Integumentary (Skin) Medical History: Negative for: History of Burn; History of pressure wounds Musculoskeletal Medical History: Negative for: Gout; Rheumatoid Arthritis; Osteoarthritis; Osteomyelitis Neurologic Medical History: Negative for: Dementia; Neuropathy; Quadriplegia; Paraplegia; Seizure Disorder  Oncologic Medical History: Negative for:  Received Chemotherapy; Received Radiation Psychiatric Complaints and Symptoms: No Complaints or Symptoms Medical History: Negative for: Anorexia/bulimia; Confinement Anxiety Immunizations Pneumococcal Vaccine: GIANNINA, BARTOLOME ([id number]) Received Pneumococcal Vaccination: Yes Implantable Devices No devices added Family and Social History Cancer: Yes - Paternal Grandparents,Father,Child,Siblings; Heart Disease: Yes - Mother; Hereditary Spherocytosis: No; Hypertension: Yes - Mother; Kidney Disease: No; Lung Disease: Yes - Mother; Seizures: No; Stroke: Yes - Mother,Child; Thyroid Problems: No; Tuberculosis: No; Former smoker - 2016; Marital Status - Separated; Alcohol Use: Never; Drug Use: No History; Caffeine Use: Daily; Financial Concerns: No; Food, Clothing or Shelter Needs: No; Support System Lacking: No; Transportation Concerns: No; Advanced Directives: No; Patient does not want information on Advanced Directives Physician Affirmation I have reviewed and agree with the above information. Electronic Signature(s) Signed: 05/22/2018 5:00:09 PM By: Harold Barban Signed: 05/22/2018 5:00:45 PM By: Worthy Keeler PA-C Entered By: Worthy Keeler on 05/22/2018 16:39:45 Mckenzie Taylor ([id number]) -------------------------------------------------------------------------------- SuperBill Details Patient Name: Mckenzie Taylor. Date of Service: 05/22/2018 Medical Record Number: [id number] Patient Account Number: [id number] Date of Birth/Sex: 1940-04-27 (77 y.o. F) Treating RN: Harold Barban Primary Care Provider: Denton Lank Other Clinician: Referring Provider: Denton Lank Treating Provider/Extender: Melburn Hake, Norvella Loscalzo Weeks in Treatment: 3 Diagnosis Coding ICD-10 Codes Code Description L02.211 Cutaneous abscess of abdominal wall L98.492 Non-pressure chronic ulcer of skin of other sites with fat layer exposed I10 Essential (primary) hypertension I50.42 Chronic combined  systolic (congestive) and diastolic (congestive) heart failure J44.9 Chronic obstructive pulmonary disease, unspecified N18.4 Chronic kidney disease, stage 4 (severe) Facility Procedures CPT4 Code: [id number] Description: 72897 - WOUND CARE VISIT-LEV 2 EST PT Modifier: Quantity: 1 Physician Procedures CPT4 Code Description: [id number] - WC PHYS LEVEL 4 - EST PT ICD-10 Diagnosis Description L02.211 Cutaneous abscess of abdominal wall L98.492 Non-pressure chronic ulcer of skin of other sites with fat la I10 Essential (primary) hypertension I50.42  Chronic combined systolic (congestive) and diastolic (congest Modifier: yer exposed ive) heart failur Quantity: 1 e Electronic Signature(s) Signed: 05/22/2018 5:00:45 PM By: Worthy Keeler PA-C Entered By: Worthy Keeler on 05/22/2018 16:40:31

## 2018-06-05 ENCOUNTER — Ambulatory Visit: Payer: Medicare Other | Admitting: Physician Assistant

## 2018-07-20 ENCOUNTER — Ambulatory Visit: Payer: Medicare Other | Admitting: Physician Assistant

## 2018-07-27 ENCOUNTER — Ambulatory Visit: Payer: Medicare Other | Admitting: Physician Assistant

## 2018-08-01 ENCOUNTER — Ambulatory Visit: Payer: Medicare Other | Admitting: Physician Assistant

## 2018-09-19 ENCOUNTER — Encounter: Payer: Medicare Other | Attending: Physician Assistant | Admitting: Physician Assistant

## 2018-09-20 NOTE — Progress Notes (Signed)
LAYLONIE, MARZEC ([id number]) Visit Report for 09/19/2018 Chief Complaint Document Details Patient Name: Mckenzie Taylor, Mckenzie Taylor. Date of Service: 09/19/2018 3:45 PM Medical Record Number: [id number] Patient Account Number: [id number] Date of Birth/Sex: 1940/06/24 (78 y.o. F) Treating RN: Montey Hora Primary Care Provider: Denton Lank Other Clinician: Referring Provider: Denton Lank Treating Provider/Extender: Melburn Hake, Leronda Lewers Weeks in Treatment: 20 Information Obtained from: Patient Chief Complaint Left abdominal wall ulcer Electronic Signature(s) Signed: 09/20/2018 1:30:00 AM By: Worthy Keeler PA-C Entered By: Worthy Keeler on 09/19/2018 16:18:42 Mckenzie Taylor ([id number]) -------------------------------------------------------------------------------- HPI Details Patient Name: Mckenzie Taylor. Date of Service: 09/19/2018 3:45 PM Medical Record Number: [id number] Patient Account Number: [id number] Date of Birth/Sex: 09/02/1940 (78 y.o. F) Treating RN: Montey Hora Primary Care Provider: Denton Lank Other Clinician: Referring Provider: Denton Lank Treating Provider/Extender: Melburn Hake, Yoshi Vicencio Weeks in Treatment: 20 History of Present Illness HPI Description: 05/01/18 on evaluation today patient presents for initial inspection in our clinic concerning an issue that she had with the two teeniest abscess over the abdominal region which occurred back in November 2019. Subsequently she was initially given [place] although more recently they mainly just been using peroxide and covering this with the bandage at home. She was given some Silvadene that has not really seem to make a big difference for her. Fortunately there's no signs of infection at this time which is excellent news. She continues to cleans this with peroxide pretty much daily. Other than this patient does have a history of hypertension, congestive heart failure, COPD, and chronic kidney disease stage IV. No  fevers, chills, nausea, or vomiting noted at this time. Patient's hemoglobin A1c was 9.3 as measured last on 02/04/18. 05/22/18 on evaluation today patient actually appears to be doing much better in regard to her down the wound compared to when I last saw her. Fortunately there's no evidence of infection at this time overall she's been tolerating the dressing changes without complication. No fevers, chills, nausea, or vomiting noted at this time. 09/19/18 telehealth Evaluation During the COVID-19 National Emergency: Verbal Consent: Obtained from patient Allergies: reviewed and the active list is current. Medication changes: patient has no current medication changes. COVID-19 Screening: 1. Have you traveled internationally or on a cruise ship in the last 14 dayso No 2. Have you had contact with someone with or under investigation for COVID-19o No 3. Have you had a fever, cough, sore throat, or experiencing shortness of breatho No On evaluation today patient actually appears to be doing quite well with regard to her abdominal ulcer. She has been taking care of this as directed since I last saw her. She has not been back into the clinic for further evaluation due to the Covid-19 Virus no emergency. Nonetheless she tells me she's not really having any drainage at this point which is good news. Overall very pleased with how things appear to be doing. Electronic Signature(s) Signed: 09/20/2018 1:30:00 AM By: Worthy Keeler PA-C Entered By: Worthy Keeler on 09/20/2018 00:07:07 Mckenzie Taylor ([id number]) -------------------------------------------------------------------------------- Physical Exam Details Patient Name: Mckenzie Taylor, Mckenzie Taylor. Date of Service: 09/19/2018 3:45 PM Medical Record Number: [id number] Patient Account Number: [id number] Date of Birth/Sex: [birth year] (78 y.o. F) Treating RN: Montey Hora Primary Care Provider: Denton Lank Other Clinician: Referring Provider: Denton Lank Treating Provider/Extender: Melburn Hake, Danell Verno Weeks in Treatment: 20 Constitutional Obese and well-hydrated in no acute distress. Respiratory normal breathing without difficulty. clear to auscultation bilaterally. Cardiovascular regular rate and rhythm with normal  S1, S2. Psychiatric this patient is able to make decisions and demonstrates good insight into disease process. Alert and Oriented x 3. pleasant and cooperative. Notes As best I can tell on the evaluation it appears the patient's wound is actually completely healed today without any signs of complication. Overall very pleased to see this. She's having no discomfort at this time. Electronic Signature(s) Signed: 09/20/2018 1:30:00 AM By: Worthy Keeler PA-C Entered By: Worthy Keeler on 09/20/2018 00:07:54 Mckenzie Taylor ([id number]) -------------------------------------------------------------------------------- Physician Orders Details Patient Name: Mckenzie Taylor, Mckenzie Taylor. Date of Service: 09/19/2018 3:45 PM Medical Record Number: [id number] Patient Account Number: [id number] Date of Birth/Sex: 02/08/1941 (78 y.o. F) Treating RN: Montey Hora Primary Care Provider: Denton Lank Other Clinician: Referring Provider: Denton Lank Treating Provider/Extender: Melburn Hake, Daquawn Seelman Weeks in Treatment: 20 Verbal / Phone Orders: No Diagnosis Coding ICD-10 Coding Code Description L02.211 Cutaneous abscess of abdominal wall L98.492 Non-pressure chronic ulcer of skin of other sites with fat layer exposed I10 Essential (primary) hypertension I50.42 Chronic combined systolic (congestive) and diastolic (congestive) heart failure J44.9 Chronic obstructive pulmonary disease, unspecified N18.4 Chronic kidney disease, stage 4 (severe) Discharge From [facility] Services o Discharge from [place] Signature(s) Signed: 09/20/2018 1:30:00 AM By: Worthy Keeler PA-C Entered By: Worthy Keeler on 09/20/2018 01:11:16 Mckenzie Taylor ([id number]) -------------------------------------------------------------------------------- Problem List Details Patient Name: Mckenzie Taylor. Date of Service: 09/19/2018 3:45 PM Medical Record Number: [id number] Patient Account Number: [id number] Date of Birth/Sex: 1940/03/29 (78 y.o. F) Treating RN: Montey Hora Primary Care Provider: Denton Lank Other Clinician: Referring Provider: Denton Lank Treating Provider/Extender: Sharalyn Ink in Treatment: 20 Active Problems ICD-10 Evaluated Encounter Code Description Active Date Today Diagnosis L02.211 Cutaneous abscess of abdominal wall 05/01/2018 No Yes L98.492 Non-pressure chronic ulcer of skin of other sites with fat layer 05/01/2018 No Yes exposed [place] (primary) hypertension 05/01/2018 No Yes I50.42 Chronic combined systolic (congestive) and diastolic 2/44/0102 No Yes (congestive) heart failure J44.9 Chronic obstructive pulmonary disease, unspecified 05/01/2018 No Yes N18.4 Chronic kidney disease, stage 4 (severe) 05/01/2018 No Yes Inactive Problems Resolved Problems Electronic Signature(s) Signed: 09/20/2018 1:30:00 AM By: Worthy Keeler PA-C Entered By: Worthy Keeler on 09/19/2018 16:18:36 Mckenzie Taylor ([id number]) -------------------------------------------------------------------------------- Progress Note Details Patient Name: Mckenzie Taylor. Date of Service: 09/19/2018 3:45 PM Medical Record Number: [id number] Patient Account Number: [id number] Date of Birth/Sex: Apr 20, 1940 (78 y.o. F) Treating RN: Montey Hora Primary Care Provider: Denton Lank Other Clinician: Referring Provider: Denton Lank Treating Provider/Extender: Sharalyn Ink in Treatment: 20 Subjective Chief Complaint Information obtained from Patient Left abdominal wall ulcer History of Present Illness (HPI) 05/01/18 on evaluation today patient presents for initial inspection in our clinic concerning an  issue that she had with the two teeniest abscess over the abdominal region which occurred back in November 2019. Subsequently she was initially given [place] although more recently they mainly just been using peroxide and covering this with the bandage at home. She was given some Silvadene that has not really seem to make a big difference for her. Fortunately there's no signs of infection at this time which is excellent news. She continues to cleans this with peroxide pretty much daily. Other than this patient does have a history of hypertension, congestive heart failure, COPD, and chronic kidney disease stage IV. No fevers, chills, nausea, or vomiting noted at this time. Patient's hemoglobin A1c was 9.3 as measured last on 02/04/18. 05/22/18 on evaluation today patient actually  appears to be doing much better in regard to her down the wound compared to when I last saw her. Fortunately there's no evidence of infection at this time overall she's been tolerating the dressing changes without complication. No fevers, chills, nausea, or vomiting noted at this time. 09/19/18 telehealth Evaluation During the COVID-19 National Emergency: Verbal Consent: Obtained from patient Allergies: reviewed and the active list is current. Medication changes: patient has no current medication changes. COVID-19 Screening: 1. Have you traveled internationally or on a cruise ship in the last 14 dayso No 2. Have you had contact with someone with or under investigation for COVID-19o No 3. Have you had a fever, cough, sore throat, or experiencing shortness of breatho No On evaluation today patient actually appears to be doing quite well with regard to her abdominal ulcer. She has been taking care of this as directed since I last saw her. She has not been back into the clinic for further evaluation due to the Covid-19 Virus no emergency. Nonetheless she tells me she's not really having any drainage at this point which  is good news. Overall very pleased with how things appear to be doing. Patient History Information obtained from Patient. Allergies trace metals Family History Cancer - Paternal Grandparents,Father,Child,Siblings, Heart Disease - Mother, Hypertension - Mother, Lung Disease - Mother, Stroke - Mother,Child, No family history of Hereditary Spherocytosis, Kidney Disease, Seizures, Thyroid Problems, Tuberculosis. Social History Mckenzie Taylor, Mckenzie Taylor ([id number]) Former smoker - 2016, Marital Status - Separated, Alcohol Use - Never, Drug Use - No History, Caffeine Use - Daily. Medical History Eyes Denies history of Cataracts, Glaucoma, Optic Neuritis Ear/Nose/Mouth/Throat Denies history of Chronic sinus problems/congestion, Middle ear problems Hematologic/Lymphatic Denies history of Anemia, Hemophilia, Human Immunodeficiency Virus, Lymphedema, Sickle Cell Disease Respiratory Patient has history of Chronic Obstructive Pulmonary Disease (COPD) Denies history of Aspiration, Asthma, Pneumothorax, Sleep Apnea, Tuberculosis Cardiovascular Patient has history of Congestive Heart Failure, Hypertension Denies history of Angina, Arrhythmia, Coronary Artery Disease, Deep Vein Thrombosis, Hypotension, Myocardial Infarction, Peripheral Arterial Disease, Peripheral Venous Disease, Phlebitis, Vasculitis Gastrointestinal Denies history of Cirrhosis , Colitis, Crohn s, Hepatitis A, Hepatitis B, Hepatitis C Endocrine Patient has history of Type II Diabetes Denies history of Type I Diabetes Genitourinary Patient has history of End Stage Renal Disease - CKD stage 4 Immunological Denies history of Lupus Erythematosus, Raynaud s, Scleroderma Integumentary (Skin) Denies history of History of Burn, History of pressure wounds Musculoskeletal Denies history of Gout, Rheumatoid Arthritis, Osteoarthritis, Osteomyelitis Neurologic Denies history of Dementia, Neuropathy, Quadriplegia, Paraplegia, Seizure  Disorder Oncologic Denies history of Received Chemotherapy, Received Radiation Psychiatric Denies history of Anorexia/bulimia, Confinement Anxiety Medical And Surgical History Notes Respiratory home O2 at 3L Genitourinary polycystic kidneys Review of Systems (ROS) Constitutional Symptoms (General Health) Denies complaints or symptoms of Fatigue, Fever, Chills, Marked Weight Change. Respiratory Denies complaints or symptoms of Chronic or frequent coughs, Shortness of Breath. Cardiovascular Denies complaints or symptoms of Chest pain, LE edema. Psychiatric Denies complaints or symptoms of Anxiety, Claustrophobia. Mckenzie Taylor, Mckenzie Taylor. ([id number]) Objective Constitutional Obese and well-hydrated in no acute distress. Respiratory normal breathing without difficulty. clear to auscultation bilaterally. Cardiovascular regular rate and rhythm with normal S1, S2. Psychiatric this patient is able to make decisions and demonstrates good insight into disease process. Alert and Oriented x 3. pleasant and cooperative. General Notes: As best I can tell on the evaluation it appears the patient's wound is actually completely healed today without any signs of complication. Overall very pleased to see this. She's having  no discomfort at this time. Integumentary (Hair, Skin) Wound #1 status is Healed - Epithelialized. Original cause of wound was Gradually Appeared. The wound is located on the Left Abdomen - midline. The wound measures 0cm length x 0cm width x 0cm depth; 0cm^2 area and 0cm^3 volume. Assessment Active Problems ICD-10 Cutaneous abscess of abdominal wall Non-pressure chronic ulcer of skin of other sites with fat layer exposed Essential (primary) hypertension Chronic combined systolic (congestive) and diastolic (congestive) heart failure Chronic obstructive pulmonary disease, unspecified Chronic kidney disease, stage 4 (severe) Plan Discharge From [facility] Services: Discharge from  [place] My suggestion at this point is gonna be that we go ahead and discontinue wound care services if she has any concerns going forward shall contact the office and let me know. Otherwise I am very pleased in general with how things have progressed. We will see her back as needed. Electronic Signature(s) Mckenzie Taylor, Mckenzie Taylor ([id number]) Signed: 09/20/2018 1:30:00 AM By: Worthy Keeler PA-C Entered By: Worthy Keeler on 09/20/2018 01:11:58 Mckenzie Taylor, Mckenzie Taylor ([id number]) -------------------------------------------------------------------------------- ROS/PFSH Details Patient Name: Mckenzie Taylor, Mckenzie Taylor. Date of Service: 09/19/2018 3:45 PM Medical Record Number: [id number] Patient Account Number: [id number] Date of Birth/Sex: [birth year] (78 y.o. F) Treating RN: Montey Hora Primary Care Provider: Denton Lank Other Clinician: Referring Provider: Denton Lank Treating Provider/Extender: Melburn Hake, Kyrra Prada Weeks in Treatment: 20 Information Obtained From Patient Constitutional Symptoms (General Health) Complaints and Symptoms: Negative for: Fatigue; Fever; Chills; Marked Weight Change Respiratory Complaints and Symptoms: Negative for: Chronic or frequent coughs; Shortness of Breath Medical History: Positive for: Chronic Obstructive Pulmonary Disease (COPD) Negative for: Aspiration; Asthma; Pneumothorax; Sleep Apnea; Tuberculosis Past Medical History Notes: home O2 at 3L Cardiovascular Complaints and Symptoms: Negative for: Chest pain; LE edema Medical History: Positive for: Congestive Heart Failure; Hypertension Negative for: Angina; Arrhythmia; Coronary Artery Disease; Deep Vein Thrombosis; Hypotension; Myocardial Infarction; Peripheral Arterial Disease; Peripheral Venous Disease; Phlebitis; Vasculitis Psychiatric Complaints and Symptoms: Negative for: Anxiety; Claustrophobia Medical History: Negative for: Anorexia/bulimia; Confinement Anxiety Eyes Medical  History: Negative for: Cataracts; Glaucoma; Optic Neuritis Ear/Nose/Mouth/Throat Medical History: Negative for: Chronic sinus problems/congestion; Middle ear problems Hematologic/Lymphatic Mckenzie Taylor, Mckenzie Taylor ([id number]) Medical History: Negative for: Anemia; Hemophilia; Human Immunodeficiency Virus; Lymphedema; Sickle Cell Disease Gastrointestinal Medical History: Negative for: Cirrhosis ; Colitis; Crohnos; Hepatitis A; Hepatitis B; Hepatitis C Endocrine Medical History: Positive for: Type II Diabetes Negative for: Type I Diabetes Genitourinary Medical History: Positive for: End Stage Renal Disease - CKD stage 4 Past Medical History Notes: polycystic kidneys Immunological Medical History: Negative for: Lupus Erythematosus; Raynaudos; Scleroderma Integumentary (Skin) Medical History: Negative for: History of Burn; History of pressure wounds Musculoskeletal Medical History: Negative for: Gout; Rheumatoid Arthritis; Osteoarthritis; Osteomyelitis Neurologic Medical History: Negative for: Dementia; Neuropathy; Quadriplegia; Paraplegia; Seizure Disorder Oncologic Medical History: Negative for: Received Chemotherapy; Received Radiation Immunizations Pneumococcal Vaccine: Received Pneumococcal Vaccination: Yes Implantable Devices No devices added Family and Social History Cancer: Yes - Paternal Grandparents,Father,Child,Siblings; Heart Disease: Yes - Mother; Hereditary Spherocytosis: No; Hypertension: Yes - Mother; Kidney Disease: No; Lung Disease: Yes - Mother; Seizures: No; Stroke: Yes - Mother,Child; Thyroid Problems: No; Tuberculosis: No; Former smoker - 2016; Marital Status - Separated; Alcohol Use: Never; Drug Use: Mckenzie Taylor, Mckenzie Taylor. ([id number]) No History; Caffeine Use: Daily; Financial Concerns: No; Food, Clothing or Shelter Needs: No; Support System Lacking: No; Transportation Concerns: No Physician Affirmation I have reviewed and agree with the above  information. Electronic Signature(s) Signed: 09/19/2018 5:36:25 PM By: Montey Hora Signed: 09/20/2018 1:30:00 AM By: Joaquim Lai  III, Chidubem Chaires PA-C Entered By: Worthy Keeler on 09/19/2018 16:18:20 Mckenzie Taylor ([id number]) -------------------------------------------------------------------------------- SuperBill Details Patient Name: Mckenzie Taylor. Date of Service: 09/19/2018 Medical Record Number: [id number] Patient Account Number: [id number] Date of Birth/Sex: 1940-10-20 (78 y.o. F) Treating RN: Montey Hora Primary Care Provider: Denton Lank Other Clinician: Referring Provider: Denton Lank Treating Provider/Extender: Melburn Hake, Zarius Furr Weeks in Treatment: 20 Diagnosis Coding ICD-10 Codes Code Description L02.211 Cutaneous abscess of abdominal wall L98.492 Non-pressure chronic ulcer of skin of other sites with fat layer exposed I10 Essential (primary) hypertension I50.42 Chronic combined systolic (congestive) and diastolic (congestive) heart failure J44.9 Chronic obstructive pulmonary disease, unspecified N18.4 Chronic kidney disease, stage 4 (severe) Physician Procedures CPT4 Code Description: [id number] - WC PHYS LEVEL 3 - EST PT ICD-10 Diagnosis Description L02.211 Cutaneous abscess of abdominal wall L98.492 Non-pressure chronic ulcer of skin of other sites with fat la I10 Essential (primary) hypertension I50.42  Chronic combined systolic (congestive) and diastolic (congest Modifier: yer exposed ive) heart failur Quantity: 1 e Electronic Signature(s) Signed: 09/20/2018 1:30:00 AM By: Worthy Keeler PA-C Entered By: Worthy Keeler on 09/19/2018 23:59:25

## 2018-09-21 NOTE — Progress Notes (Signed)
ELAH, AVELLINO ([id number]) Visit Report for 09/19/2018 Allergy List Details Patient Name: Mckenzie Taylor, Mckenzie Taylor. Date of Service: 09/19/2018 3:45 PM Medical Record Number: [id number] Patient Account Number: [id number] Date of Birth/Sex: [birth year] (77 y.o. F) Treating RN: Montey Hora Primary Care Garnetta Fedrick: Denton Lank Other Clinician: Referring Majour Frei: Denton Lank Treating Tavi Gaughran/Extender: Melburn Hake, HOYT Weeks in Treatment: 20 Allergies Active Allergies trace metals Allergy Notes Electronic Signature(s) Signed: 09/20/2018 1:30:00 AM By: Worthy Keeler PA-C Entered By: Worthy Keeler on 09/19/2018 16:13:53 Mckenzie Taylor ([id number]) -------------------------------------------------------------------------------- Wound Assessment Details Patient Name: Mckenzie Taylor. Date of Service: 09/19/2018 3:45 PM Medical Record Number: [id number] Patient Account Number: [id number] Date of Birth/Sex: 08-22-1940 (77 y.o. F) Treating RN: Montey Hora Primary Care Quame Spratlin: Denton Lank Other Clinician: Referring Maicol Bowland: Denton Lank Treating Glynda Soliday/Extender: Melburn Hake, HOYT Weeks in Treatment: 20 Wound Status Wound Number: 1 Primary Etiology: Cyst Wound Location: Left Abdomen - midline Wound Status: Healed - Epithelialized Wounding Event: Gradually Appeared Date Acquired: 01/23/2018 Weeks Of Treatment: 20 Clustered Wound: No Wound Measurements Length: (cm) 0 % Width: (cm) 0 % Depth: (cm) 0 Area: (cm) 0 Volume: (cm) 0 Reduction in Area: 100% Reduction in Volume: 100% Wound Description Full Thickness Without Exposed Support Classification: Structures Electronic Signature(s) Signed: 09/20/2018 1:30:00 AM By: Worthy Keeler PA-C Signed: 09/21/2018 4:02:59 PM By: Montey Hora Entered By: Worthy Keeler on 09/20/2018 00:07:29

## 2018-10-19 ENCOUNTER — Encounter: Payer: Medicare Other | Attending: Physician Assistant | Admitting: Physician Assistant

## 2018-10-19 NOTE — Progress Notes (Signed)
VALISA, KARPEL ([id number]) Visit Report for 10/19/2018 Allergy List Details Patient Name: Mckenzie Taylor, Mckenzie Taylor. Date of Service: 10/19/2018 4:00 PM Medical Record Number: [id number] Patient Account Number: [id number] Date of Birth/Sex: 1940/08/12 (78 y.o. F) Treating RN: Primary Care Kahlen Morais: Denton Lank Other Clinician: Referring Juvon Teater: Denton Lank Treating Keegen Heffern/Extender: Melburn Hake, HOYT Weeks in Treatment: 24 Allergies Active Allergies trace metals Allergy Notes Electronic Signature(s) Signed: 10/19/2018 4:39:20 PM By: Worthy Keeler PA-C Previous Signature: 10/19/2018 4:34:33 PM Version By: Worthy Keeler PA-C Entered By: Worthy Keeler on 10/19/2018 16:39:19 Mckenzie Taylor ([id number]) -------------------------------------------------------------------------------- Wound Assessment Details Patient Name: Mckenzie Taylor. Date of Service: 10/19/2018 4:00 PM Medical Record Number: [id number] Patient Account Number: [id number] Date of Birth/Sex: [birth year] (78 y.o. F) Treating RN: Primary Care Simona Rocque: Denton Lank Other Clinician: Referring Rejeana Fadness: Denton Lank Treating Gemini Bunte/Extender: Melburn Hake, HOYT Weeks in Treatment: 24 Wound Status Wound Number: 1R Primary Cyst Etiology: Wound Location: Left Abdomen - midline Wound Open Wounding Event: Gradually Appeared Status: Date Acquired: 01/23/2018 Comorbid Chronic Obstructive Pulmonary Disease Weeks Of Treatment: 24 History: (COPD), Congestive Heart Failure, Clustered Wound: No Hypertension, Type II Diabetes, End Stage Renal Disease Wound Measurements Length: (cm) 2 Width: (cm) 2 Depth: (cm) 0.4 Area: (cm) 3.142 Volume: (cm) 1.257 % Reduction in Area: -24.2% % Reduction in Volume: -396.8% Tunneling: No Undermining: No Wound Description Full Thickness Without Exposed Support Classification: Structures Wound Margin: Distinct, outline attached Exudate Medium Amount: Exudate Type:  Purulent Exudate Color: yellow, brown, green Foul Odor After Cleansing: No Slough/Fibrino Yes Wound Bed Granulation Amount: Medium (34-66%) Exposed Structure Granulation Quality: Red Fascia Exposed: No Necrotic Amount: Medium (34-66%) Fat Layer (Subcutaneous Tissue) Exposed: Yes Necrotic Quality: Adherent Slough Tendon Exposed: No Muscle Exposed: No Joint Exposed: No Bone Exposed: No Electronic Signature(s) Signed: 10/19/2018 4:42:49 PM By: Worthy Keeler PA-C Entered By: Worthy Keeler on 10/19/2018 16:42:48

## 2018-10-20 NOTE — Progress Notes (Signed)
ZINIA, INNOCENT ([id number]) Visit Report for 10/19/2018 Chief Complaint Document Details Patient Name: Mckenzie Taylor, Mckenzie Taylor. Date of Service: 10/19/2018 4:00 PM Medical Record Number: [id number] Patient Account Number: [id number] Date of Birth/Sex: 1940-07-25 (78 y.o. F) Treating RN: Primary Care Provider: Denton Lank Other Clinician: Referring Provider: Denton Lank Treating Provider/Extender: Melburn Hake, Enrique Manganaro Weeks in Treatment: 24 Information Obtained from: Patient Chief Complaint Left abdominal wall ulcer Electronic Signature(s) Signed: 10/19/2018 4:44:17 PM By: Worthy Keeler PA-C Entered By: Worthy Keeler on 10/19/2018 16:44:16 Mckenzie Taylor ([id number]) -------------------------------------------------------------------------------- HPI Details Patient Name: Mckenzie Taylor. Date of Service: 10/19/2018 4:00 PM Medical Record Number: [id number] Patient Account Number: [id number] Date of Birth/Sex: [birth year] (78 y.o. F) Treating RN: Primary Care Provider: Denton Lank Other Clinician: Referring Provider: Denton Lank Treating Provider/Extender: Melburn Hake, [address] Weeks in Treatment: 24 History of Present Illness HPI Description: 05/01/18 on evaluation today patient presents for initial inspection in our clinic concerning an issue that she had with the two teeniest abscess over the abdominal region which occurred back in November 2019. Subsequently she was initially given [place] although more recently they mainly just been using peroxide and covering this with the bandage at home. She was given some Silvadene that has not really seem to make a big difference for her. Fortunately there's no signs of infection at this time which is excellent news. She continues to cleans this with peroxide pretty much daily. Other than this patient does have a history of hypertension, congestive heart failure, COPD, and chronic kidney disease stage IV. No fevers, chills, nausea, or  vomiting noted at this time. Patient's hemoglobin A1c was 9.3 as measured last on 02/04/18. 05/22/18 on evaluation today patient actually appears to be doing much better in regard to her down the wound compared to when I last saw her. Fortunately there's no evidence of infection at this time overall she's been tolerating the dressing changes without complication. No fevers, chills, nausea, or vomiting noted at this time. 09/19/18 telehealth Evaluation During the COVID-19 National Emergency: Verbal Consent: Obtained from patient Allergies: reviewed and the active list is current. Medication changes: patient has no current medication changes. COVID-19 Screening: 1. Have you traveled internationally or on a cruise ship in the last 14 dayso No 2. Have you had contact with someone with or under investigation for COVID-19o No 3. Have you had a fever, cough, sore throat, or experiencing shortness of breatho No On evaluation today patient actually appears to be doing quite well with regard to her abdominal ulcer. She has been taking care of this as directed since I last saw her. She has not been back into the clinic for further evaluation due to the Covid-19 Virus no emergency. Nonetheless she tells me she's not really having any drainage at this point which is good news. Overall very pleased with how things appear to be doing. 10/19/18 patient seen today for a telehealth visit due to her concerns about coming in to the clinic during the Covid-19 Virus endemic. With that being said she unfortunately at the last time I saw her thought of the wound was healed but indeed it was not and she had some skin that peeled off and revealed a purulent drainage underneath that she's been dealing with for the past couple of days. There is no sign of systemic infection but she does have some issues with discomfort currently. Electronic Signature(s) Signed: 10/20/2018 2:47:02 AM By: Worthy Keeler PA-C Entered By: Worthy Keeler on  10/19/2018 22:26:08 Mckenzie Taylor, Mckenzie Taylor ([id number]) -------------------------------------------------------------------------------- Physical Exam Details Patient Name: Mckenzie Taylor, Mckenzie Taylor. Date of Service: 10/19/2018 4:00 PM Medical Record Number: [id number] Patient Account Number: [id number] Date of Birth/Sex: [birth year] (78 y.o. F) Treating RN: Primary Care Provider: Denton Lank Other Clinician: Referring Provider: Denton Lank Treating Provider/Extender: Melburn Hake, Suresh Audi Weeks in Treatment: 24 Constitutional Obese and well-hydrated in no acute distress. Ears, Nose, Mouth, and Throat no gross abnormality of ear auricles or external auditory canals. normal hearing noted during conversation. mucus membranes moist. Respiratory normal breathing without difficulty. Psychiatric this patient is able to make decisions and demonstrates good insight into disease process. Alert and Oriented x 3. pleasant and cooperative. Notes Upon visual inspection patient's wound bed did appear to show some purulent drainage at this time. Fortunately there is no signs of more significant infection spreading around the area and I believe that potentially a topical antibiotic could be beneficial for her. She is definitely in agreement with this. For that reason I did go ahead and recommend that I send in a prescription for her to the pharmacy for gentamicin. Electronic Signature(s) Signed: 10/20/2018 2:47:02 AM By: Worthy Keeler PA-C Entered By: Worthy Keeler on 10/19/2018 22:27:02 Mckenzie Taylor ([id number]) -------------------------------------------------------------------------------- Physician Orders Details Patient Name: Mckenzie Taylor, Mckenzie Taylor. Date of Service: 10/19/2018 4:00 PM Medical Record Number: [id number] Patient Account Number: [id number] Date of Birth/Sex: 10-17-1940 (78 y.o. F) Treating RN: Primary Care Provider: Denton Lank Other Clinician: Referring Provider: Denton Lank Treating Provider/Extender: Melburn Hake, Benzion Mesta Weeks in Treatment: 77 Verbal / Phone Orders: No Diagnosis Coding ICD-10 Coding Code Description L02.211 Cutaneous abscess of abdominal wall L98.492 Non-pressure chronic ulcer of skin of other sites with fat layer exposed I10 Essential (primary) hypertension I50.42 Chronic combined systolic (congestive) and diastolic (congestive) heart failure J44.9 Chronic obstructive pulmonary disease, unspecified N18.4 Chronic kidney disease, stage 4 (severe) Wound Cleansing Wound #1R Left Abdomen - midline o May Shower, gently pat wound dry prior to applying new dressing. - Clean with Dial Antibacterial soap, rinse and pat dry. Primary Wound Dressing o Gentamicin Sulfate Cream Secondary Dressing Wound #1R Left Abdomen - midline o Dry Gauze - Secure with tape Dressing Change Frequency Wound #1R Left Abdomen - midline o Change dressing twice daily. - 1-2 times a day Follow-up Appointments o Return Appointment in 1 week. Patient Medications Allergies: trace metals Notifications Medication Indication Start End gentamicin 10/19/2018 DOSE topical 0.1 % cream - cream topical applied to the open wound with each dressing change 1-2 times a days Electronic Signature(s) Signed: 10/19/2018 4:52:04 PM By: Worthy Keeler PA-C Previous Signature: 10/19/2018 4:45:51 PM Version By: Danielle Dess, [place]. ([id number]) Entered By: Worthy Keeler on 10/19/2018 16:52:02 Mckenzie Taylor ([id number]) -------------------------------------------------------------------------------- Problem List Details Patient Name: Mckenzie Taylor. Date of Service: 10/19/2018 4:00 PM Medical Record Number: [id number] Patient Account Number: [id number] Date of Birth/Sex: August 31, 1940 (78 y.o. F) Treating RN: Primary Care Provider: Denton Lank Other Clinician: Referring Provider: Denton Lank Treating Provider/Extender: Sharalyn Ink in  Treatment: 24 Active Problems ICD-10 Evaluated Encounter Code Description Active Date Today Diagnosis L02.211 Cutaneous abscess of abdominal wall 05/01/2018 No Yes L98.492 Non-pressure chronic ulcer of skin of other sites with fat layer 05/01/2018 No Yes exposed [place] (primary) hypertension 05/01/2018 No Yes I50.42 Chronic combined systolic (congestive) and diastolic 6/54/6503 No Yes (congestive) heart failure J44.9 Chronic obstructive pulmonary disease, unspecified 05/01/2018 No Yes N18.4 Chronic kidney disease, stage 4 (severe) 05/01/2018 No Yes  Inactive Problems Resolved Problems Electronic Signature(s) Signed: 10/19/2018 4:44:12 PM By: Worthy Keeler PA-C Entered By: Worthy Keeler on 10/19/2018 16:44:11 Mckenzie Taylor ([id number]) -------------------------------------------------------------------------------- Progress Note Details Patient Name: Mckenzie Taylor. Date of Service: 10/19/2018 4:00 PM Medical Record Number: [id number] Patient Account Number: [id number] Date of Birth/Sex: 11/12/1940 (78 y.o. F) Treating RN: Primary Care Provider: Denton Lank Other Clinician: Referring Provider: Denton Lank Treating Provider/Extender: Melburn Hake, Fredrika Canby Weeks in Treatment: 24 Subjective Chief Complaint Information obtained from Patient Left abdominal wall ulcer History of Present Illness (HPI) 05/01/18 on evaluation today patient presents for initial inspection in our clinic concerning an issue that she had with the two teeniest abscess over the abdominal region which occurred back in November 2019. Subsequently she was initially given [place] although more recently they mainly just been using peroxide and covering this with the bandage at home. She was given some Silvadene that has not really seem to make a big difference for her. Fortunately there's no signs of infection at this time which is excellent news. She continues to cleans this with peroxide pretty much  daily. Other than this patient does have a history of hypertension, congestive heart failure, COPD, and chronic kidney disease stage IV. No fevers, chills, nausea, or vomiting noted at this time. Patient's hemoglobin A1c was 9.3 as measured last on 02/04/18. 05/22/18 on evaluation today patient actually appears to be doing much better in regard to her down the wound compared to when I last saw her. Fortunately there's no evidence of infection at this time overall she's been tolerating the dressing changes without complication. No fevers, chills, nausea, or vomiting noted at this time. 09/19/18 telehealth Evaluation During the COVID-19 National Emergency: Verbal Consent: Obtained from patient Allergies: reviewed and the active list is current. Medication changes: patient has no current medication changes. COVID-19 Screening: 1. Have you traveled internationally or on a cruise ship in the last 14 dayso No 2. Have you had contact with someone with or under investigation for COVID-19o No 3. Have you had a fever, cough, sore throat, or experiencing shortness of breatho No On evaluation today patient actually appears to be doing quite well with regard to her abdominal ulcer. She has been taking care of this as directed since I last saw her. She has not been back into the clinic for further evaluation due to the Covid-19 Virus no emergency. Nonetheless she tells me she's not really having any drainage at this point which is good news. Overall very pleased with how things appear to be doing. 10/19/18 patient seen today for a telehealth visit due to her concerns about coming in to the clinic during the Covid-19 Virus endemic. With that being said she unfortunately at the last time I saw her thought of the wound was healed but indeed it was not and she had some skin that peeled off and revealed a purulent drainage underneath that she's been dealing with for the past couple of days. There is no sign of systemic  infection but she does have some issues with discomfort currently. Patient History Information obtained from Patient. Allergies trace metals Family History Mckenzie Taylor, Mckenzie Taylor ([id number]) Cancer - Paternal Grandparents,Father,Child,Siblings, Heart Disease - Mother, Hypertension - Mother, Lung Disease - Mother, Stroke - Mother,Child, No family history of Hereditary Spherocytosis, Kidney Disease, Seizures, Thyroid Problems, Tuberculosis. Social History Former smoker - 2016, Marital Status - Separated, Alcohol Use - Never, Drug Use - No History, Caffeine Use - Daily. Medical History Eyes Denies  history of Cataracts, Glaucoma, Optic Neuritis Ear/Nose/Mouth/Throat Denies history of Chronic sinus problems/congestion, Middle ear problems Hematologic/Lymphatic Denies history of Anemia, Hemophilia, Human Immunodeficiency Virus, Lymphedema, Sickle Cell Disease Respiratory Patient has history of Chronic Obstructive Pulmonary Disease (COPD) Denies history of Aspiration, Asthma, Pneumothorax, Sleep Apnea, Tuberculosis Cardiovascular Patient has history of Congestive Heart Failure, Hypertension Denies history of Angina, Arrhythmia, Coronary Artery Disease, Deep Vein Thrombosis, Hypotension, Myocardial Infarction, Peripheral Arterial Disease, Peripheral Venous Disease, Phlebitis, Vasculitis Gastrointestinal Denies history of Cirrhosis , Colitis, Crohn s, Hepatitis A, Hepatitis B, Hepatitis C Endocrine Patient has history of Type II Diabetes Denies history of Type I Diabetes Genitourinary Patient has history of End Stage Renal Disease - CKD stage 4 Immunological Denies history of Lupus Erythematosus, Raynaud s, Scleroderma Integumentary (Skin) Denies history of History of Burn, History of pressure wounds Musculoskeletal Denies history of Gout, Rheumatoid Arthritis, Osteoarthritis, Osteomyelitis Neurologic Denies history of Dementia, Neuropathy, Quadriplegia, Paraplegia, Seizure  Disorder Oncologic Denies history of Received Chemotherapy, Received Radiation Psychiatric Denies history of Anorexia/bulimia, Confinement Anxiety Medical And Surgical History Notes Respiratory home O2 at 3L Genitourinary polycystic kidneys Review of Systems (ROS) Constitutional Symptoms (General Health) Denies complaints or symptoms of Fatigue, Fever, Chills, Marked Weight Change. Respiratory Denies complaints or symptoms of Chronic or frequent coughs, Shortness of Breath. Cardiovascular Denies complaints or symptoms of Chest pain, LE edema. Psychiatric Denies complaints or symptoms of Anxiety, Claustrophobia. Mckenzie Taylor, Mckenzie Taylor. ([id number]) Objective Constitutional Obese and well-hydrated in no acute distress. Ears, Nose, Mouth, and Throat no gross abnormality of ear auricles or external auditory canals. normal hearing noted during conversation. mucus membranes moist. Respiratory normal breathing without difficulty. Psychiatric this patient is able to make decisions and demonstrates good insight into disease process. Alert and Oriented x 3. pleasant and cooperative. General Notes: Upon visual inspection patient's wound bed did appear to show some purulent drainage at this time. Fortunately there is no signs of more significant infection spreading around the area and I believe that potentially a topical antibiotic could be beneficial for her. She is definitely in agreement with this. For that reason I did go ahead and recommend that I send in a prescription for her to the pharmacy for gentamicin. Integumentary (Hair, Skin) Wound #1R status is Open. Original cause of wound was Gradually Appeared. The wound is located on the Left Abdomen - midline. The wound measures 2cm length x 2cm width x 0.4cm depth; 3.142cm^2 area and 1.257cm^3 volume. There is Fat Layer (Subcutaneous Tissue) Exposed exposed. There is no tunneling or undermining noted. There is a medium amount of purulent  drainage noted. The wound margin is distinct with the outline attached to the wound base. There is medium (34- 66%) red granulation within the wound bed. There is a medium (34-66%) amount of necrotic tissue within the wound bed including Adherent Slough. Assessment Active Problems ICD-10 Cutaneous abscess of abdominal wall Non-pressure chronic ulcer of skin of other sites with fat layer exposed Essential (primary) hypertension Chronic combined systolic (congestive) and diastolic (congestive) heart failure Chronic obstructive pulmonary disease, unspecified Chronic kidney disease, stage 4 (severe) Plan Mckenzie Taylor, Mckenzie Taylor ([id number]) Wound Cleansing: Wound #1R Left Abdomen - midline: May Shower, gently pat wound dry prior to applying new dressing. - Clean with Dial Antibacterial soap, rinse and pat dry. Primary Wound Dressing: Gentamicin Sulfate Cream Secondary Dressing: Wound #1R Left Abdomen - midline: Dry Gauze - Secure with tape Dressing Change Frequency: Wound #1R Left Abdomen - midline: Change dressing twice daily. - 1-2 times a day Follow-up  Appointments: Return Appointment in 1 week. The following medication(s) was prescribed: gentamicin topical 0.1 % cream cream topical applied to the open wound with each dressing change 1-2 times a days starting 10/19/2018 1. I'm gonna suggest that we go ahead and initiate gentamicin cream be applied topically to the wound one-two times a day and the patient is in agreement with plan. 2. I also recommend that we go ahead and cover the wound with a Dry gauze dressing this can be secured with tape and she is definitely okay with this as she has plenty of that currently. 3. I'm gonna recommend further that we go ahead and see about making an appointment for her here in the clinic for me to reevaluate how this looks in person as it's kind of difficult to see the over the videoconference. She's in agreement with that plan. Please see above for  specific wound care orders. We will see patient for re-evaluation in 1 week(s) here in the clinic. If anything worsens or changes patient will contact our office for additional recommendations. Electronic Signature(s) Signed: 10/20/2018 2:47:02 AM By: Worthy Keeler PA-C Entered By: Worthy Keeler on 10/19/2018 22:28:48 Mckenzie Taylor ([id number]) -------------------------------------------------------------------------------- ROS/PFSH Details Patient Name: Mckenzie Taylor. Date of Service: 10/19/2018 4:00 PM Medical Record Number: [id number] Patient Account Number: [id number] Date of Birth/Sex: [birth year] (78 y.o. F) Treating RN: Primary Care Provider: Denton Lank Other Clinician: Referring Provider: Denton Lank Treating Provider/Extender: Melburn Hake, Nevin Grizzle Weeks in Treatment: 24 Information Obtained From Patient Constitutional Symptoms (General Health) Complaints and Symptoms: Negative for: Fatigue; Fever; Chills; Marked Weight Change Respiratory Complaints and Symptoms: Negative for: Chronic or frequent coughs; Shortness of Breath Medical History: Positive for: Chronic Obstructive Pulmonary Disease (COPD) Negative for: Aspiration; Asthma; Pneumothorax; Sleep Apnea; Tuberculosis Past Medical History Notes: home O2 at 3L Cardiovascular Complaints and Symptoms: Negative for: Chest pain; LE edema Medical History: Positive for: Congestive Heart Failure; Hypertension Negative for: Angina; Arrhythmia; Coronary Artery Disease; Deep Vein Thrombosis; Hypotension; Myocardial Infarction; Peripheral Arterial Disease; Peripheral Venous Disease; Phlebitis; Vasculitis Psychiatric Complaints and Symptoms: Negative for: Anxiety; Claustrophobia Medical History: Negative for: Anorexia/bulimia; Confinement Anxiety Eyes Medical History: Negative for: Cataracts; Glaucoma; Optic Neuritis Ear/Nose/Mouth/Throat Medical History: Negative for: Chronic sinus problems/congestion; Middle ear  problems Hematologic/Lymphatic Mckenzie Taylor, Mckenzie Taylor ([id number]) Medical History: Negative for: Anemia; Hemophilia; Human Immunodeficiency Virus; Lymphedema; Sickle Cell Disease Gastrointestinal Medical History: Negative for: Cirrhosis ; Colitis; Crohnos; Hepatitis A; Hepatitis B; Hepatitis C Endocrine Medical History: Positive for: Type II Diabetes Negative for: Type I Diabetes Genitourinary Medical History: Positive for: End Stage Renal Disease - CKD stage 4 Past Medical History Notes: polycystic kidneys Immunological Medical History: Negative for: Lupus Erythematosus; Raynaudos; Scleroderma Integumentary (Skin) Medical History: Negative for: History of Burn; History of pressure wounds Musculoskeletal Medical History: Negative for: Gout; Rheumatoid Arthritis; Osteoarthritis; Osteomyelitis Neurologic Medical History: Negative for: Dementia; Neuropathy; Quadriplegia; Paraplegia; Seizure Disorder Oncologic Medical History: Negative for: Received Chemotherapy; Received Radiation Immunizations Pneumococcal Vaccine: Received Pneumococcal Vaccination: Yes Implantable Devices No devices added Family and Social History Cancer: Yes - Paternal Grandparents,Father,Child,Siblings; Heart Disease: Yes - Mother; Hereditary Spherocytosis: No; Hypertension: Yes - Mother; Kidney Disease: No; Lung Disease: Yes - Mother; Seizures: No; Stroke: Yes - Mother,Child; Thyroid Problems: No; Tuberculosis: No; Former smoker - 2016; Marital Status - Separated; Alcohol Use: Never; Drug Use: Mckenzie Taylor, Mckenzie Taylor. ([id number]) No History; Caffeine Use: Daily; Financial Concerns: No; Food, Clothing or Shelter Needs: No; Support System Lacking: No; Transportation Concerns: No Physician Affirmation I have reviewed  and agree with the above information. Electronic Signature(s) Signed: 10/20/2018 2:47:02 AM By: Worthy Keeler PA-C Entered By: Worthy Keeler on 10/19/2018 16:38:31 Mckenzie Taylor  ([id number]) -------------------------------------------------------------------------------- SuperBill Details Patient Name: Mckenzie Taylor. Date of Service: 10/19/2018 Medical Record Number: [id number] Patient Account Number: [id number] Date of Birth/Sex: March 01, 1941 (78 y.o. F) Treating RN: Primary Care Provider: Denton Lank Other Clinician: Referring Provider: Denton Lank Treating Provider/Extender: Melburn Hake, Quyen Cutsforth Weeks in Treatment: 24 Diagnosis Coding ICD-10 Codes Code Description L02.211 Cutaneous abscess of abdominal wall L98.492 Non-pressure chronic ulcer of skin of other sites with fat layer exposed I10 Essential (primary) hypertension I50.42 Chronic combined systolic (congestive) and diastolic (congestive) heart failure J44.9 Chronic obstructive pulmonary disease, unspecified N18.4 Chronic kidney disease, stage 4 (severe) Physician Procedures CPT4 Code Description: [id number] - WC PHYS LEVEL 4 - EST PT ICD-10 Diagnosis Description L02.211 Cutaneous abscess of abdominal wall L98.492 Non-pressure chronic ulcer of skin of other sites with fat la I10 Essential (primary) hypertension I50.42  Chronic combined systolic (congestive) and diastolic (congest Modifier: yer exposed ive) heart failur Quantity: 1 e Electronic Signature(s) Signed: 10/20/2018 2:47:02 AM By: Worthy Keeler PA-C Entered By: Worthy Keeler on 10/19/2018 22:29:05

## 2018-10-31 ENCOUNTER — Encounter: Payer: Medicare Other | Admitting: Physician Assistant

## 2018-11-07 ENCOUNTER — Encounter: Payer: Medicare Other | Admitting: Physician Assistant

## 2018-11-07 NOTE — Progress Notes (Signed)
CISSY, GALBREATH ([id number]) Visit Report for 11/07/2018 Allergy List Details Patient Name: Mckenzie Taylor, Mckenzie Taylor. Date of Service: 11/07/2018 3:45 PM Medical Record Number: [id number] Patient Account Number: [id number] Date of Birth/Sex: 1940-03-26 (77 y.o. F) Treating RN: Montey Hora Primary Care Rainey Rodger: Denton Lank Other Clinician: Referring Jearldean Gutt: Denton Lank Treating Midas Daughety/Extender: Melburn Hake, HOYT Weeks in Treatment: 27 Allergies Active Allergies trace metals Allergy Notes Electronic Signature(s) Signed: 11/07/2018 4:21:23 PM By: Worthy Keeler PA-C Entered By: Worthy Keeler on 11/07/2018 16:21:23

## 2018-11-08 ENCOUNTER — Emergency Department: Payer: Medicare Other

## 2018-11-08 ENCOUNTER — Other Ambulatory Visit: Payer: Self-pay

## 2018-11-08 ENCOUNTER — Inpatient Hospital Stay
Admission: EM | Admit: 2018-11-08 | Discharge: 2018-11-11 | DRG: 291 | Disposition: A | Discharge status: Home Health | Payer: Medicare Other | Attending: Internal Medicine | Admitting: Internal Medicine

## 2018-11-08 ENCOUNTER — Encounter: Payer: Self-pay | Admitting: Emergency Medicine

## 2018-11-08 DIAGNOSIS — Z79899 Other long term (current) drug therapy: Secondary | ICD-10-CM | POA: Diagnosis not present

## 2018-11-08 DIAGNOSIS — J449 Chronic obstructive pulmonary disease, unspecified: Secondary | ICD-10-CM | POA: Diagnosis present

## 2018-11-08 DIAGNOSIS — Z66 Do not resuscitate: Secondary | ICD-10-CM | POA: Diagnosis present

## 2018-11-08 DIAGNOSIS — J441 Chronic obstructive pulmonary disease with (acute) exacerbation: Secondary | ICD-10-CM | POA: Diagnosis present

## 2018-11-08 DIAGNOSIS — Z9981 Dependence on supplemental oxygen: Secondary | ICD-10-CM | POA: Diagnosis not present

## 2018-11-08 DIAGNOSIS — D500 Iron deficiency anemia secondary to blood loss (chronic): Secondary | ICD-10-CM | POA: Diagnosis not present

## 2018-11-08 DIAGNOSIS — Z20828 Contact with and (suspected) exposure to other viral communicable diseases: Secondary | ICD-10-CM | POA: Diagnosis present

## 2018-11-08 DIAGNOSIS — I2510 Atherosclerotic heart disease of native coronary artery without angina pectoris: Secondary | ICD-10-CM | POA: Diagnosis present

## 2018-11-08 DIAGNOSIS — E876 Hypokalemia: Secondary | ICD-10-CM | POA: Diagnosis present

## 2018-11-08 DIAGNOSIS — E039 Hypothyroidism, unspecified: Secondary | ICD-10-CM | POA: Diagnosis present

## 2018-11-08 DIAGNOSIS — J9600 Acute respiratory failure, unspecified whether with hypoxia or hypercapnia: Secondary | ICD-10-CM | POA: Diagnosis present

## 2018-11-08 DIAGNOSIS — I214 Non-ST elevation (NSTEMI) myocardial infarction: Secondary | ICD-10-CM | POA: Diagnosis not present

## 2018-11-08 DIAGNOSIS — D631 Anemia in chronic kidney disease: Secondary | ICD-10-CM | POA: Diagnosis not present

## 2018-11-08 DIAGNOSIS — E785 Hyperlipidemia, unspecified: Secondary | ICD-10-CM | POA: Diagnosis present

## 2018-11-08 DIAGNOSIS — F418 Other specified anxiety disorders: Secondary | ICD-10-CM | POA: Diagnosis present

## 2018-11-08 DIAGNOSIS — R079 Chest pain, unspecified: Secondary | ICD-10-CM | POA: Insufficient documentation

## 2018-11-08 DIAGNOSIS — I5043 Acute on chronic combined systolic (congestive) and diastolic (congestive) heart failure: Secondary | ICD-10-CM | POA: Diagnosis present

## 2018-11-08 DIAGNOSIS — E669 Obesity, unspecified: Secondary | ICD-10-CM | POA: Diagnosis present

## 2018-11-08 DIAGNOSIS — Z7951 Long term (current) use of inhaled steroids: Secondary | ICD-10-CM | POA: Diagnosis not present

## 2018-11-08 DIAGNOSIS — R0602 Shortness of breath: Secondary | ICD-10-CM | POA: Diagnosis not present

## 2018-11-08 DIAGNOSIS — J432 Centrilobular emphysema: Secondary | ICD-10-CM | POA: Diagnosis not present

## 2018-11-08 DIAGNOSIS — Z794 Long term (current) use of insulin: Secondary | ICD-10-CM

## 2018-11-08 DIAGNOSIS — E781 Pure hyperglyceridemia: Secondary | ICD-10-CM | POA: Diagnosis present

## 2018-11-08 DIAGNOSIS — K219 Gastro-esophageal reflux disease without esophagitis: Secondary | ICD-10-CM | POA: Diagnosis present

## 2018-11-08 DIAGNOSIS — I25118 Atherosclerotic heart disease of native coronary artery with other forms of angina pectoris: Secondary | ICD-10-CM

## 2018-11-08 DIAGNOSIS — E1165 Type 2 diabetes mellitus with hyperglycemia: Secondary | ICD-10-CM | POA: Diagnosis present

## 2018-11-08 DIAGNOSIS — D638 Anemia in other chronic diseases classified elsewhere: Secondary | ICD-10-CM | POA: Diagnosis present

## 2018-11-08 DIAGNOSIS — J9621 Acute and chronic respiratory failure with hypoxia: Secondary | ICD-10-CM | POA: Diagnosis present

## 2018-11-08 DIAGNOSIS — N179 Acute kidney failure, unspecified: Secondary | ICD-10-CM | POA: Diagnosis present

## 2018-11-08 DIAGNOSIS — I361 Nonrheumatic tricuspid (valve) insufficiency: Secondary | ICD-10-CM | POA: Diagnosis not present

## 2018-11-08 DIAGNOSIS — K439 Ventral hernia without obstruction or gangrene: Secondary | ICD-10-CM | POA: Diagnosis present

## 2018-11-08 DIAGNOSIS — G8929 Other chronic pain: Secondary | ICD-10-CM | POA: Diagnosis present

## 2018-11-08 DIAGNOSIS — I130 Hypertensive heart and chronic kidney disease with heart failure and stage 1 through stage 4 chronic kidney disease, or unspecified chronic kidney disease: Secondary | ICD-10-CM | POA: Diagnosis not present

## 2018-11-08 DIAGNOSIS — E1122 Type 2 diabetes mellitus with diabetic chronic kidney disease: Secondary | ICD-10-CM | POA: Diagnosis present

## 2018-11-08 DIAGNOSIS — I248 Other forms of acute ischemic heart disease: Secondary | ICD-10-CM | POA: Diagnosis present

## 2018-11-08 DIAGNOSIS — N183 Chronic kidney disease, stage 3 (moderate): Secondary | ICD-10-CM | POA: Diagnosis present

## 2018-11-08 DIAGNOSIS — Z96643 Presence of artificial hip joint, bilateral: Secondary | ICD-10-CM | POA: Diagnosis present

## 2018-11-08 HISTORY — DX: Chronic combined systolic (congestive) and diastolic (congestive) heart failure: I50.42

## 2018-11-08 HISTORY — DX: Personal history of nicotine dependence: Z87.891

## 2018-11-08 HISTORY — DX: Hyperlipidemia, unspecified: E78.5

## 2018-11-08 HISTORY — DX: Anemia in other chronic diseases classified elsewhere: D63.8

## 2018-11-08 LAB — BASIC METABOLIC PANEL
Anion gap: 10 (ref 5–15)
BUN: 30 mg/dL — ABNORMAL HIGH (ref 8–23)
CO2: 29 mmol/L (ref 22–32)
Calcium: 8.8 mg/dL — ABNORMAL LOW (ref 8.9–10.3)
Chloride: 101 mmol/L (ref 98–111)
Creatinine, Ser: 2.17 mg/dL — ABNORMAL HIGH (ref 0.44–1.00)
GFR calc Af Amer: 25 mL/min — ABNORMAL LOW (ref >60)
GFR calc non Af Amer: 21 mL/min — ABNORMAL LOW (ref >60)
Glucose, Bld: 152 mg/dL — ABNORMAL HIGH (ref 70–99)
Potassium: 3.7 mmol/L (ref 3.5–5.1)
Sodium: 140 mmol/L (ref 135–145)

## 2018-11-08 LAB — CBC
HCT: 32 % — ABNORMAL LOW (ref 36.0–46.0)
Hemoglobin: 9.5 g/dL — ABNORMAL LOW (ref 12.0–15.0)
MCH: 26.6 pg (ref 26.0–34.0)
MCHC: 29.7 g/dL — ABNORMAL LOW (ref 30.0–36.0)
MCV: 89.6 fL (ref 80.0–100.0)
Platelets: 404 10*3/uL — ABNORMAL HIGH (ref 150–400)
RBC: 3.57 MIL/uL — ABNORMAL LOW (ref 3.87–5.11)
RDW: 17.5 % — ABNORMAL HIGH (ref 11.5–15.5)
WBC: 11.2 10*3/uL — ABNORMAL HIGH (ref 4.0–10.5)
nRBC: 0.2 % (ref 0.0–0.2)

## 2018-11-08 LAB — GLUCOSE, CAPILLARY
Glucose-Capillary: 196 mg/dL — ABNORMAL HIGH (ref 70–99)
Glucose-Capillary: 122 mg/dL — ABNORMAL HIGH (ref 70–99)

## 2018-11-08 LAB — HEPATIC FUNCTION PANEL
ALT: 19 U/L (ref 0–44)
AST: 18 U/L (ref 15–41)
Albumin: 3.3 g/dL — ABNORMAL LOW (ref 3.5–5.0)
Alkaline Phosphatase: 64 U/L (ref 38–126)
Bilirubin, Direct: <0.1 mg/dL (ref 0.0–0.2)
Total Bilirubin: 0.2 mg/dL — ABNORMAL LOW (ref 0.3–1.2)
Total Protein: 6.6 g/dL (ref 6.5–8.1)

## 2018-11-08 LAB — TROPONIN I (HIGH SENSITIVITY)
Troponin I (High Sensitivity): 130 ng/L (ref <18)
Troponin I (High Sensitivity): 356 ng/L (ref <18)
Troponin I (High Sensitivity): 533 ng/L (ref <18)
Troponin I (High Sensitivity): 584 ng/L (ref <18)
Troponin I (High Sensitivity): 556 ng/L (ref <18)

## 2018-11-08 LAB — HEMOGLOBIN A1C
Hgb A1c MFr Bld: 8.3 % — ABNORMAL HIGH (ref 4.8–5.6)
Mean Plasma Glucose: 191.51 mg/dL

## 2018-11-08 LAB — APTT: aPTT: 29 seconds (ref 24–36)

## 2018-11-08 LAB — PROTIME-INR
INR: 1 (ref 0.8–1.2)
Prothrombin Time: 12.8 seconds (ref 11.4–15.2)

## 2018-11-08 LAB — HEPARIN LEVEL (UNFRACTIONATED): Heparin Unfractionated: <0.1 IU/mL — ABNORMAL LOW (ref 0.30–0.70)

## 2018-11-08 LAB — MAGNESIUM: Magnesium: 1.3 mg/dL — ABNORMAL LOW (ref 1.7–2.4)

## 2018-11-08 LAB — TSH: TSH: 1.317 u[IU]/mL (ref 0.350–4.500)

## 2018-11-08 MED ORDER — IPRATROPIUM-ALBUTEROL 0.5-2.5 (3) MG/3ML IN SOLN
3.0000 mL | Freq: Four times a day (QID) | RESPIRATORY_TRACT | Status: DC | PRN
  Administered 2018-11-09 (×2): 3 mL via RESPIRATORY_TRACT
  Filled 2018-11-08 (×3): qty 3

## 2018-11-08 MED ORDER — OXYCODONE-ACETAMINOPHEN 5-325 MG PO TABS: 1.0000 | ORAL_TABLET | Freq: Three times a day (TID) | ORAL | Status: DC | PRN

## 2018-11-08 MED ORDER — PANTOPRAZOLE SODIUM 40 MG PO TBEC
40.0000 mg | DELAYED_RELEASE_TABLET | Freq: Every day | ORAL | Status: DC
  Administered 2018-11-08 – 2018-11-11 (×4): 40 mg via ORAL
  Filled 2018-11-08 (×4): qty 1

## 2018-11-08 MED ORDER — OXYCODONE HCL 5 MG PO TABS: 5.0000 mg | ORAL_TABLET | Freq: Three times a day (TID) | ORAL | Status: DC | PRN

## 2018-11-08 MED ORDER — TIZANIDINE HCL 4 MG PO TABS
4.0000 mg | ORAL_TABLET | Freq: Three times a day (TID) | ORAL | Status: DC | PRN
  Filled 2018-11-08: qty 1

## 2018-11-08 MED ORDER — UMECLIDINIUM BROMIDE 62.5 MCG/INH IN AEPB
1.0000 | INHALATION_SPRAY | Freq: Every day | RESPIRATORY_TRACT | Status: DC
  Administered 2018-11-08 – 2018-11-11 (×4): 1 via RESPIRATORY_TRACT
  Filled 2018-11-08: qty 7

## 2018-11-08 MED ORDER — ASPIRIN EC 81 MG PO TBEC
81.0000 mg | DELAYED_RELEASE_TABLET | Freq: Every day | ORAL | Status: DC
  Administered 2018-11-09 – 2018-11-11 (×3): 81 mg via ORAL
  Filled 2018-11-08 (×3): qty 1

## 2018-11-08 MED ORDER — ACETAMINOPHEN 325 MG PO TABS: 650.0000 mg | ORAL_TABLET | ORAL | Status: DC | PRN

## 2018-11-08 MED ORDER — FLUTICASONE-UMECLIDIN-VILANT 100-62.5-25 MCG/INH IN AEPB: 1.0000 | INHALATION_SPRAY | Freq: Every day | RESPIRATORY_TRACT | Status: DC

## 2018-11-08 MED ORDER — INSULIN DETEMIR 100 UNIT/ML ~~LOC~~ SOLN
20.0000 [IU] | Freq: Every day | SUBCUTANEOUS | Status: DC
  Administered 2018-11-08 – 2018-11-10 (×3): 20 [IU] via SUBCUTANEOUS
  Filled 2018-11-08 (×4): qty 0.2

## 2018-11-08 MED ORDER — ASPIRIN 81 MG PO CHEW: 324.0000 mg | CHEWABLE_TABLET | ORAL | Status: DC

## 2018-11-08 MED ORDER — PROMETHAZINE HCL 25 MG PO TABS
12.5000 mg | ORAL_TABLET | Freq: Four times a day (QID) | ORAL | Status: DC | PRN
  Administered 2018-11-10: 17:00:00 12.5 mg via ORAL
  Filled 2018-11-08 (×2): qty 1

## 2018-11-08 MED ORDER — FUROSEMIDE 10 MG/ML IJ SOLN
40.0000 mg | Freq: Two times a day (BID) | INTRAMUSCULAR | Status: DC
  Administered 2018-11-08 – 2018-11-09 (×3): 40 mg via INTRAVENOUS
  Filled 2018-11-08 (×3): qty 4

## 2018-11-08 MED ORDER — HEPARIN BOLUS VIA INFUSION
4000.0000 [IU] | Freq: Once | INTRAVENOUS | Status: AC
  Administered 2018-11-08: 4000 [IU] via INTRAVENOUS
  Filled 2018-11-08: qty 4000

## 2018-11-08 MED ORDER — BUSPIRONE HCL 5 MG PO TABS
5.0000 mg | ORAL_TABLET | Freq: Two times a day (BID) | ORAL | Status: DC
  Administered 2018-11-08 – 2018-11-11 (×6): 5 mg via ORAL
  Filled 2018-11-08 (×7): qty 1

## 2018-11-08 MED ORDER — FUROSEMIDE 10 MG/ML IJ SOLN
60.0000 mg | Freq: Once | INTRAMUSCULAR | Status: AC
  Administered 2018-11-08: 60 mg via INTRAVENOUS
  Filled 2018-11-08: qty 8

## 2018-11-08 MED ORDER — SODIUM CHLORIDE 0.9% FLUSH: 3.0000 mL | Freq: Once | INTRAVENOUS | Status: DC

## 2018-11-08 MED ORDER — ASPIRIN 300 MG RE SUPP: 300.0000 mg | RECTAL | Status: DC

## 2018-11-08 MED ORDER — AMLODIPINE BESYLATE 10 MG PO TABS
10.0000 mg | ORAL_TABLET | Freq: Every day | ORAL | Status: DC
  Administered 2018-11-08 – 2018-11-11 (×4): 10 mg via ORAL
  Filled 2018-11-08 (×4): qty 1

## 2018-11-08 MED ORDER — OXYCODONE-ACETAMINOPHEN 10-325 MG PO TABS: 1.0000 | ORAL_TABLET | Freq: Three times a day (TID) | ORAL | Status: DC | PRN

## 2018-11-08 MED ORDER — INSULIN ASPART 100 UNIT/ML ~~LOC~~ SOLN
0.0000 [IU] | Freq: Three times a day (TID) | SUBCUTANEOUS | Status: DC
  Administered 2018-11-08: 17:00:00 3 [IU] via SUBCUTANEOUS
  Administered 2018-11-09: 17:00:00 2 [IU] via SUBCUTANEOUS
  Administered 2018-11-09: 5 [IU] via SUBCUTANEOUS
  Administered 2018-11-09: 09:00:00 2 [IU] via SUBCUTANEOUS
  Administered 2018-11-10 (×2): 3 [IU] via SUBCUTANEOUS
  Administered 2018-11-10 – 2018-11-11 (×2): 2 [IU] via SUBCUTANEOUS
  Filled 2018-11-08 (×8): qty 1

## 2018-11-08 MED ORDER — CARVEDILOL 3.125 MG PO TABS
3.1250 mg | ORAL_TABLET | Freq: Two times a day (BID) | ORAL | Status: DC
  Administered 2018-11-08 – 2018-11-11 (×6): 3.125 mg via ORAL
  Filled 2018-11-08 (×6): qty 1

## 2018-11-08 MED ORDER — NITROGLYCERIN 0.4 MG SL SUBL
0.4000 mg | SUBLINGUAL_TABLET | SUBLINGUAL | Status: DC | PRN
  Administered 2018-11-08: 0.4 mg via SUBLINGUAL
  Filled 2018-11-08: qty 1

## 2018-11-08 MED ORDER — TRAZODONE HCL 50 MG PO TABS
50.0000 mg | ORAL_TABLET | Freq: Every day | ORAL | Status: DC
  Administered 2018-11-08 – 2018-11-10 (×3): 50 mg via ORAL
  Filled 2018-11-08 (×3): qty 1

## 2018-11-08 MED ORDER — FLUTICASONE FUROATE-VILANTEROL 100-25 MCG/INH IN AEPB
1.0000 | INHALATION_SPRAY | Freq: Every day | RESPIRATORY_TRACT | Status: DC
  Administered 2018-11-08 – 2018-11-11 (×4): 1 via RESPIRATORY_TRACT
  Filled 2018-11-08: qty 28

## 2018-11-08 MED ORDER — MORPHINE SULFATE (PF) 2 MG/ML IV SOLN: 1.0000 mg | INTRAVENOUS | Status: DC | PRN

## 2018-11-08 MED ORDER — INSULIN ASPART 100 UNIT/ML ~~LOC~~ SOLN: 0.0000 [IU] | Freq: Every day | SUBCUTANEOUS | Status: DC

## 2018-11-08 MED ORDER — HEPARIN (PORCINE) 25000 UT/250ML-% IV SOLN
1650.0000 [IU]/h | INTRAVENOUS | Status: DC
  Administered 2018-11-08: 1050 [IU]/h via INTRAVENOUS
  Administered 2018-11-09: 1200 [IU]/h via INTRAVENOUS
  Administered 2018-11-09: 1550 [IU]/h via INTRAVENOUS
  Filled 2018-11-08 (×3): qty 250

## 2018-11-08 MED ORDER — ONDANSETRON HCL 4 MG/2ML IJ SOLN
4.0000 mg | Freq: Four times a day (QID) | INTRAMUSCULAR | Status: DC | PRN
  Administered 2018-11-10: 4 mg via INTRAVENOUS
  Filled 2018-11-08: qty 2

## 2018-11-08 MED ORDER — ALBUTEROL SULFATE (2.5 MG/3ML) 0.083% IN NEBU: 2.5000 mg | INHALATION_SOLUTION | RESPIRATORY_TRACT | Status: DC | PRN

## 2018-11-08 MED ORDER — ALBUTEROL SULFATE HFA 108 (90 BASE) MCG/ACT IN AERS: 2.0000 | INHALATION_SPRAY | RESPIRATORY_TRACT | Status: DC | PRN

## 2018-11-08 MED ORDER — FLUOXETINE HCL 20 MG PO CAPS
40.0000 mg | ORAL_CAPSULE | Freq: Every evening | ORAL | Status: DC
  Administered 2018-11-08 – 2018-11-10 (×3): 40 mg via ORAL
  Filled 2018-11-08 (×4): qty 2

## 2018-11-08 MED ORDER — ATORVASTATIN CALCIUM 20 MG PO TABS
40.0000 mg | ORAL_TABLET | Freq: Every evening | ORAL | Status: DC
  Administered 2018-11-08 – 2018-11-10 (×3): 40 mg via ORAL
  Filled 2018-11-08 (×3): qty 2

## 2018-11-08 NOTE — ED Notes (Signed)
Sent green and purple top tubes to lab. 

## 2018-11-08 NOTE — ED Notes (Signed)
ED TO INPATIENT HANDOFF REPORT  ED Nurse Name and Phone #: Sakai Wolford [phone]  S Name/Age/Gender Mckenzie Taylor 78 y.o. female Room/Bed: ED13A/ED13A  Code Status   Code Status: Prior  Home/SNF/Other Home Patient oriented to: self, place, time and situation Is this baseline? Yes   Triage Complete: Triage complete  Chief Complaint cp  Triage Note Pt states midsternal cp that began last night while pt was in bed. Appears in no distress, denies any hx of the same.    Allergies Allergies  Allergen Reactions  . Other Rash    Pt reports allergy to metals.    Level of Care/Admitting Diagnosis ED Disposition    ED Disposition Condition [facility] Area: [place] [[id number]]  Level of Care: Telemetry [5]  Covid Evaluation: Asymptomatic Screening Protocol (No Symptoms)  Diagnosis: Chest pain [397673]  Admitting Physician: Hyman Bible DODD [[id number]]  Attending Physician: Hyman Bible DODD [[id number]]  Estimated length of stay: past midnight tomorrow  Certification:: I certify this patient will need inpatient services for at least 2 midnights  PT Class (Do Not Modify): Inpatient [101]  PT Acc Code (Do Not Modify): Private [1]       B Medical/Surgery History Past Medical History:  Diagnosis Date  . Autosomal recessive polycystic kidneys   . Avascular necrosis of hip (HCC)    bilateral  . CAD (coronary artery disease)    mild  . CHF (congestive heart failure) ([place])   . Chronic kidney disease    cyst  . COPD (chronic obstructive pulmonary disease) ([place])   . Diabetes type 2, controlled ([place])   . GERD (gastroesophageal reflux disease)   . HTN (hypertension)   . Hypothyroidism    subclinical. low TSH, normal thyroid panel. biopsy 2010  . Vitamin B12 deficiency    Past Surgical History:  Procedure Laterality Date  . CHOLECYSTECTOMY    . hip replacement     bilateral-secondary to avascular necrosis  . right eye lens replacement    .  ULNAR NERVE REPAIR     bilateral  . VESICOVAGINAL FISTULA CLOSURE W/ TAH       A IV Location/Drains/Wounds Patient Lines/Drains/Airways Status   Active Line/Drains/Airways    Name:   Placement date:   Placement time:   Site:   Days:   Peripheral IV 11/08/18 Left Antecubital   11/08/18    1308    Antecubital   less than 1   Peripheral IV 11/08/18 Left Forearm   11/08/18    1338    Forearm   less than 1          Intake/Output Last 24 hours No intake or output data in the 24 hours ending 11/08/18 1513  Labs/Imaging Results for orders placed or performed during the hospital encounter of 11/08/18 (from the past 48 hour(s))  Basic metabolic panel     Status: Abnormal   Collection Time: 11/08/18 10:10 AM  Result Value Ref Range   Sodium 140 135 - 145 mmol/L   Potassium 3.7 3.5 - 5.1 mmol/L   Chloride 101 98 - 111 mmol/L   CO2 29 22 - 32 mmol/L   Glucose, Bld 152 (H) 70 - 99 mg/dL   BUN 30 (H) 8 - 23 mg/dL   Creatinine, Ser 2.17 (H) 0.44 - 1.00 mg/dL   Calcium 8.8 (L) 8.9 - 10.3 mg/dL   GFR calc non Af Amer 21 (L) >60 mL/min   GFR calc Af Amer 25 (L) >  60 mL/min   Anion gap 10 5 - 15    Comment: Performed at [facility], [address] [place], [place] [address]  CBC     Status: Abnormal   Collection Time: 11/08/18 10:10 AM  Result Value Ref Range   WBC 11.2 (H) 4.0 - 10.5 K/uL   RBC 3.57 (L) 3.87 - 5.11 MIL/uL   Hemoglobin 9.5 (L) 12.0 - 15.0 g/dL   HCT 32.0 (L) 36.0 - 46.0 %   MCV 89.6 80.0 - 100.0 fL   MCH 26.6 26.0 - 34.0 pg   MCHC 29.7 (L) 30.0 - 36.0 g/dL   RDW 17.5 (H) 11.5 - 15.5 %   Platelets 404 (H) 150 - 400 K/uL   nRBC 0.2 0.0 - 0.2 %    Comment: Performed at [facility], [address] [place], [place] [address]  Troponin I (High Sensitivity)     Status: Abnormal   Collection Time: 11/08/18 10:10 AM  Result Value Ref Range   Troponin I (High Sensitivity) 130 (HH) <18 ng/L    Comment: CRITICAL RESULT CALLED TO, READ BACK BY AND  VERIFIED WITH COLLYN GRALISPIE ON 11/08/2018 AT 1223 TIK/MLK (NOTE) Elevated high sensitivity troponin I (hsTnI) values and significant  changes across serial measurements may suggest ACS but many other  chronic and acute conditions are known to elevate hsTnI results.  Refer to the "Links" section for chest pain algorithms and additional  guidance. Performed at [facility], [place], [place] [address]   Troponin I (High Sensitivity)     Status: Abnormal   Collection Time: 11/08/18  1:06 PM  Result Value Ref Range   Troponin I (High Sensitivity) 356 (HH) <18 ng/L    Comment: READ BACK AND VERIFIED WITH Tallie Dodds WALLACE ON 11/08/2018 AT 1347 TIK/MMC. (NOTE) Elevated high sensitivity troponin I (hsTnI) values and significant  changes across serial measurements may suggest ACS but many other  chronic and acute conditions are known to elevate hsTnI results.  Refer to the "Links" section for chest pain algorithms and additional  guidance. Performed at [facility], [address] [place], [place] [address]   APTT     Status: None   Collection Time: 11/08/18  1:41 PM  Result Value Ref Range   aPTT 29 24 - 36 seconds    Comment: Performed at [facility], [address] [place], [place] [address]  Protime-INR     Status: None   Collection Time: 11/08/18  1:41 PM  Result Value Ref Range   Prothrombin Time 12.8 11.4 - 15.2 seconds   INR 1.0 0.8 - 1.2    Comment: (NOTE) INR goal varies based on device and disease states. Performed at [facility], [address], [place], [place] [address]    Dg Chest 2 View  Result Date: 11/08/2018 CLINICAL DATA:  78 year old female with midsternal chest pain since last night. Pain radiating to the left shoulder. EXAM: CHEST - 2 VIEW COMPARISON:  Chest radiographs 02/10/2018 and earlier. FINDINGS: Stable lung volumes. Stable cardiac size at the upper limits of normal. Chronic linear scarring at  both lung bases, and the costophrenic angles. Stable ventilation. No pneumothorax, pulmonary edema or acute pulmonary opacity. Visualized tracheal air column is within normal limits. No acute osseous abnormality identified. Negative visible bowel gas pattern. IMPRESSION: Chronic lung base scarring.  No acute cardiopulmonary abnormality. Electronically Signed   By: Genevie Ann M.D.   On: 11/08/2018 11:00  Pending Labs Unresulted Labs (From admission, onward)    Start     Ordered   11/08/18 2200  Heparin level (unfractionated)  Once-Timed,   STAT     11/08/18 1355   11/08/18 1304  Novel Coronavirus, NAA (Hosp order, Send-out to [facility]; TAT 18-24 hrs  (Symptomatic/High Risk of Exposure/Tier 1 Patients Labs with Precautions)  Once,   STAT    Question Answer Comment  Is this test for diagnosis or screening Diagnosis of ill patient   Symptomatic for COVID-19 as defined by CDC No   Hospitalized for COVID-19 No   Admitted to ICU for COVID-19 No   Previously tested for COVID-19 No   Resident in a congregate (group) care setting No   Employed in healthcare setting No   Pregnant No      11/08/18 1303   Signed and Held  Hemoglobin A1c  Once,   R    Comments: To assess prior glycemic control    Signed and Held   Signed and Held  Basic metabolic panel  Tomorrow morning,   R     Signed and Held   Signed and Held  Lipid panel  Tomorrow morning,   R     Signed and Held   Signed and Held  CBC  Tomorrow morning,   R     Signed and Held          Vitals/Pain Today's Vitals   11/08/18 1330 11/08/18 1500 11/08/18 1501 11/08/18 1502  BP: 123/75 134/83    Pulse: 99   (!) 101  Resp: (!) 25 20  20   Temp:      TempSrc:      SpO2: 100%   99%  Weight:      Height:      PainSc:   7      Isolation Precautions No active isolations  Medications Medications  heparin bolus via infusion 4,000 Units (4,000 Units Intravenous Bolus from Bag 11/08/18 1353)    Followed by  heparin ADULT infusion 100  units/mL (25000 units/253mL sodium chloride 0.45%) (1,050 Units/hr Intravenous New Bag/Given 11/08/18 1348)  furosemide (LASIX) injection 60 mg (60 mg Intravenous Given 11/08/18 1317)    Mobility walks Low fall risk   Focused Assessments Cardiac Assessment Handoff:    Lab Results  Component Value Date   TROPONINI <0.03 02/10/2018   No results found for: DDIMER Does the Patient currently have chest pain? No     R Recommendations: See Admitting Provider Note  Report given to:   Additional Notes:

## 2018-11-08 NOTE — ED Notes (Signed)
Dr Kerman Passey notified of new troponin level

## 2018-11-08 NOTE — Progress Notes (Signed)
Family Meeting Note  Advance Directive:yes  Today a meeting took place with the Patient.  Patient is able to participate.  The following clinical team members were present during this meeting:MD  The following were discussed:Patient's diagnosis: chest pain, Patient's progosis: Unable to determine and Goals for treatment: DNR  Additional follow-up to be provided: prn  Time spent during discussion:20 minutes  Evette Doffing, MD

## 2018-11-08 NOTE — ED Notes (Signed)
Pt given something to eat- Dr Kerman Passey aware

## 2018-11-08 NOTE — Consult Note (Signed)
ANTICOAGULATION CONSULT NOTE - Initial Consult  Pharmacy Consult for Heparin Drip Indication: chest pain/ACS  Allergies  Allergen Reactions  . Other Rash    Pt reports allergy to metals.    Patient Measurements: Height: 5\' 5"  (165.1 cm) Weight: 212 lb (96.2 kg) IBW/kg (Calculated) : 57 Heparin Dosing Weight: 78.7 kg  Vital Signs: Temp: 98.8 F (37.1 C) (08/26 1008) Temp Source: Oral (08/26 1008) BP: 133/72 (08/26 1008) Pulse Rate: 102 (08/26 1008)  Labs: Recent Labs    11/08/18 1010  HGB 9.5*  HCT 32.0*  PLT 404*  CREATININE 2.17*  TROPONINIHS 130*    Estimated Creatinine Clearance: 25.3 mL/min (A) (by C-G formula based on SCr of 2.17 mg/dL (H)).   Medical History: Past Medical History:  Diagnosis Date  . Autosomal recessive polycystic kidneys   . Avascular necrosis of hip (HCC)    bilateral  . CAD (coronary artery disease)    mild  . CHF (congestive heart failure) ([place])   . Chronic kidney disease    cyst  . COPD (chronic obstructive pulmonary disease) ([place])   . Diabetes type 2, controlled ([place])   . GERD (gastroesophageal reflux disease)   . HTN (hypertension)   . Hypothyroidism    subclinical. low TSH, normal thyroid panel. biopsy 2010  . Vitamin B12 deficiency     Medications:  (Not in a hospital admission)  Scheduled:  . furosemide  60 mg Intravenous Once  . heparin  4,000 Units Intravenous Once  . sodium chloride flush  3 mL Intravenous Once   Infusions:  . heparin     PRN:  Anti-infectives (From admission, onward)   None      Assessment: Pharmacy consulted to initiate Heparin Drip on 78yo patient presenting to ED with chest pain.  Patient has no previous history of anticoagulant therapy. Will initiate drip immediately.  Goal of Therapy:  Heparin level 0.3-0.7 units/ml Monitor platelets by anticoagulation protocol: Yes   Plan:  Give 4000 units bolus x 1 Start heparin infusion at 1050 units/hr Check anti-Xa level in 8 hours and  daily while on heparin Continue to monitor H&H and platelets  Sharalyn Lomba A Sallie Maker 11/08/2018,1:06 PM

## 2018-11-08 NOTE — ED Triage Notes (Signed)
Pt states midsternal cp that began last night while pt was in bed. Appears in no distress, denies any hx of the same.

## 2018-11-08 NOTE — ED Notes (Signed)
Dr. Paduchowski at bedside.  

## 2018-11-08 NOTE — ED Provider Notes (Signed)
[facility] Emergency Department Provider Note  Time seen: 12:54 PM  I have reviewed the triage vital signs and the nursing notes.   HISTORY  Chief Complaint Chest Pain   HPI Mckenzie Taylor is a 78 y.o. female with a past medical history of polycystic kidney disease, CAD, CHF, CKD, COPD, diabetes, gastric reflux, hypertension, presents to the emergency department for chest pain.  According to the patient she had been experiencing a mild amount of increased fluid retention in her legs and abdomen per patient states some increased shortness of breath recently as well but believes this is due to the fluid.  Patient states around 930 last night she developed moderate to severe chest pressure along with diaphoresis nausea vomiting.  States the chest pressure lasted throughout the night and into this morning so the patient came to the emergency department.  States since arriving to the emergency department her pain has diminished significantly.  States very minimal pressure currently.   Past Medical History:  Diagnosis Date  . Autosomal recessive polycystic kidneys   . Avascular necrosis of hip (HCC)    bilateral  . CAD (coronary artery disease)    mild  . CHF (congestive heart failure) ([place])   . Chronic kidney disease    cyst  . COPD (chronic obstructive pulmonary disease) ([place])   . Diabetes type 2, controlled ([place])   . GERD (gastroesophageal reflux disease)   . HTN (hypertension)   . Hypothyroidism    subclinical. low TSH, normal thyroid panel. biopsy 2010  . Vitamin B12 deficiency     Patient Active Problem List   Diagnosis Date Noted  . Cellulitis of right leg 02/10/2018  . COPD exacerbation ([place]) 01/04/2018  . CHF (congestive heart failure) ([place]) 01/06/2017  . COPD (chronic obstructive pulmonary disease) ([place]) 11/25/2016  . Nausea and vomiting 10/29/2016  . Acute on chronic congestive heart failure ([place])   . Acute pulmonary edema (HCC)   . Acute  respiratory failure with hypoxia ([place]) 10/25/2016  . COPD with acute exacerbation ([place]) 08/14/2016  . CAP (community acquired pneumonia) 08/14/2016  . Acute systolic CHF (congestive heart failure) ([place]) 08/14/2016  . Accelerated hypertension 08/14/2016  . GERD (gastroesophageal reflux disease) 08/14/2016  . Diabetes ([place]) 08/14/2016  . Respiratory distress 05/05/2016  . Acute respiratory failure ([place]) 03/24/2016  . 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 TOBACCO ABUSE 12/17/2008  . Coronary atherosclerosis of native coronary artery 12/17/2008  . 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 CLAUDICATION, INTERMITTENT 12/17/2008    Past Surgical History:  Procedure Laterality Date  . CHOLECYSTECTOMY    . hip replacement     bilateral-secondary to avascular necrosis  . right eye lens replacement    . ULNAR NERVE REPAIR     bilateral  . VESICOVAGINAL FISTULA CLOSURE W/ TAH      Prior to Admission medications   Medication Sig Start Date End Date Taking? Authorizing Provider  albuterol (VENTOLIN HFA) 108 (90 BASE) MCG/ACT inhaler Inhale 2 puffs into the lungs every 4 (four) hours as needed.     [provider]  amLODipine (NORVASC) 10 MG tablet Take 10 mg by mouth daily.      [provider]  atorvastatin (LIPITOR) 40 MG tablet Take 40 mg by mouth every evening.     [provider]  busPIRone (BUSPAR) 5 MG tablet Take 5 mg by mouth 2 (two) times daily.    [provider]  Cholecalciferol (VITAMIN D3) 5000 units  CAPS Take 1 capsule by mouth daily.    [provider]  Cyanocobalamin 1500 MCG TBDP Take 3 tablets by mouth daily.    [provider]  FLUoxetine (PROZAC) 40 MG capsule Take 40 mg by mouth every evening.     [provider]  Fluticasone-Umeclidin-Vilant (TRELEGY ELLIPTA) 100-62.5-25 MCG/INH AEPB Inhale 1 puff into the lungs daily.    [provider]  gabapentin (NEURONTIN) 100 MG  capsule Take 200 mg by mouth 3 (three) times daily.    [provider]  ipratropium-albuterol (DUONEB) 0.5-2.5 (3) MG/3ML SOLN Take 3 mLs by nebulization 4 (four) times daily. 03/28/16   Vaughan Basta, MD  latanoprost (XALATAN) 0.005 % ophthalmic solution Place 1 drop into both eyes at bedtime.    [provider]  LEVEMIR FLEXTOUCH 100 UNIT/ML Pen Inject 32 Units into the skin at bedtime.     [provider]  metolazone (ZAROXOLYN) 2.5 MG tablet Take 2.5 mg by mouth daily.    [provider]  NOVOLOG FLEXPEN 100 UNIT/ML FlexPen Inject 8 Units into the skin 3 (three) times daily as needed for high blood sugar. Sliding scale if blood sugar is above 200 - max 35u daily 04/02/16   [provider]  omeprazole (PRILOSEC) 20 MG capsule Take 20 mg by mouth daily.    [provider]  oxyCODONE-acetaminophen (PERCOCET) 10-325 MG tablet Take 1 tablet by mouth 3 (three) times daily as needed for pain.  12/08/17   [provider]  predniSONE (STERAPRED UNI-PAK 21 TAB) 10 MG (21) TBPK tablet Take 6 tabs first day, 5 tab on day 2, then 4 on day 3rd, 3 tabs on day 4th , 2 tab on day 5th, and 1 tab on 6th day. Patient not taking: Reported on 02/10/2018 01/10/18   Vaughan Basta, MD  promethazine (PHENERGAN) 25 MG tablet Take 25 mg by mouth every 6 (six) hours as needed for nausea.     [provider]  tiZANidine (ZANAFLEX) 4 MG tablet Take 4 mg by mouth 3 (three) times daily as needed for muscle spasms.     [provider]  torsemide (DEMADEX) 10 MG tablet Take 2 tablets (20 mg total) by mouth daily as needed (leg swelling). Patient taking differently: Take 20 mg by mouth daily.  01/10/18   Vaughan Basta, MD  traZODone (DESYREL) 50 MG tablet Take 50 mg by mouth at bedtime. 08/10/16   [provider]    Allergies  Allergen Reactions  . Other Rash    Pt reports allergy to metals.    Family History   Problem Relation Age of Onset  . Cancer Father        lung  . COPD Mother   . Heart disease Mother   . Heart disease Maternal Uncle     Social History Social History   Tobacco Use  . Smoking status: Former Smoker    Packs/day: 1.00    Years: 50.00    Pack years: 50.00  . Smokeless tobacco: Never Used  . Tobacco comment: 1 ppd - 50 years   Substance Use Topics  . Alcohol use: No  . Drug use: No    Review of Systems Constitutional: Negative for fever. Cardiovascular: Chest pain overnight and this morning. Respiratory: Shortness of breath worsening over the past few days. Gastrointestinal: Negative for abdominal pain.  Positive for nausea vomiting last night. Musculoskeletal: Negative for musculoskeletal complaints Neurological: Negative for headache All other ROS negative  ____________________________________________  PHYSICAL EXAM:  VITAL SIGNS: ED Triage Vitals  Enc Vitals Group     BP 11/08/18 1008 133/72     Pulse Rate 11/08/18 1008 (!) 102     Resp 11/08/18 1008 19     Temp 11/08/18 1008 98.8 F (37.1 C)     Temp Source 11/08/18 1008 Oral     SpO2 11/08/18 1008 98 %     Weight 11/08/18 1007 212 lb (96.2 kg)     Height 11/08/18 1007 5\' 5"  (1.651 m)     Head Circumference --      Peak Flow --      Pain Score 11/08/18 1038 8     Pain Loc --      Pain Edu? --      Excl. in [place]? --    Constitutional: Alert and oriented. Well appearing and in no distress. Eyes: Normal exam ENT      Head: Normocephalic and atraumatic.      Mouth/Throat: Mucous membranes are moist. Cardiovascular: Normal rate, regular rhythm.  Respiratory: Normal respiratory effort without tachypnea nor retractions. Breath sounds are clear  Gastrointestinal: Soft and nontender. No distention.  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 Musculoskeletal: Nontender with normal range of motion in all extremities.  1+ lower extreme edema, equal bilaterally. Neurologic:  Normal speech and  language. No gross focal neurologic deficits  Skin:  Skin is warm, dry and intact.  Psychiatric: Mood and affect are normal.   ____________________________________________    EKG  EKG viewed and interpreted by myself shows a normal sinus rhythm at 100 bpm with a narrow QRS, normal axis, largely normal intervals besides slight QTC prolongation, nonspecific ST changes.  ____________________________________________    RADIOLOGY  Chest x-ray shows chronic changes without acute abnormality.  ____________________________________________   INITIAL IMPRESSION / ASSESSMENT AND PLAN / ED COURSE  Pertinent labs & imaging results that were available during my care of the patient were reviewed by me and considered in my medical decision making (see chart for details).   Patient presents to the emergency department for chest pain around 11/21/1927 last night which continued overnight into today.  Differential would include angina, ACS, musculoskeletal pain, pneumonia, pneumothorax, pulmonary edema.  Patient's labs have resulted showing elevated troponin I 130 currently.  Patient's last troponin from last year was less than 0.03.  Patient's kidney function has worsened slightly creatinine currently 2.17.  Given the patient's elevated troponin with onset of severe chest pain nausea diaphoresis and shortness of breath we will start the patient on heparin, discussed with cardiology and admit to the hospitalist service.  Given the patient's increased fluid retention we will also dose IV Lasix.  Patient agreeable to plan of care.  Repeat troponin now 356 consistent with NSTEMI.  Cardiology informed.  Patient will be admitted to the hospital service.  Heparin infusion started.  Meleane SCOTT FIX was evaluated in Emergency Department on 11/08/2018 for the symptoms described in the history of present illness. She was evaluated in the context of the global COVID-19 pandemic, which necessitated consideration that  the patient might be at risk for infection with the SARS-CoV-2 virus that causes COVID-19. Institutional protocols and algorithms that pertain to the evaluation of patients at risk for COVID-19 are in a state of rapid change based on information released by regulatory bodies including the CDC and federal and state organizations. These policies and algorithms were followed during the patient's care in the ED.  CRITICAL CARE Performed by: Harvest Dark  Total critical care time: 30 minutes  Critical care time was exclusive of separately billable procedures and treating other patients.  Critical care was necessary to treat or prevent imminent or life-threatening deterioration.  Critical care was time spent personally by me on the following activities: development of treatment plan with patient and/or surrogate as well as nursing, discussions with consultants, evaluation of patient's response to treatment, examination of patient, obtaining history from patient or surrogate, ordering and performing treatments and interventions, ordering and review of laboratory studies, ordering and review of radiographic studies, pulse oximetry and re-evaluation of patient's condition.   ____________________________________________   FINAL CLINICAL IMPRESSION(S) / ED DIAGNOSES  Peripheral edema NSTEMI   Harvest Dark, MD 11/08/18 1406

## 2018-11-08 NOTE — H&P (Signed)
[facility] NAME: Mckenzie Taylor    MR#:  [id number]  DATE OF BIRTH:  18-Dec-1940  DATE OF ADMISSION:  11/08/2018  PRIMARY CARE PHYSICIAN: Denton Lank, MD   REQUESTING/REFERRING PHYSICIAN: Harvest Dark, MD  CHIEF COMPLAINT:   Chief Complaint  Patient presents with  . Chest Pain    HISTORY OF PRESENT ILLNESS:  Mckenzie Taylor  is a 78 y.o. female with a known history of ARPCKD, CAD, CHF, chronic respiratory failure secondary to COPD on 3 L O2, hypertension, hypothyroidism, type 2 diabetes presented to the ED with chest pain that started yesterday evening at 9 PM.  The pain is located in the center of her chest.  She had associated nausea, vomiting, diaphoresis.  The pain feels like "bad indigestion".  The pain radiated to her left arm yesterday evening, but is not radiating this morning.  She endorses worsening lower extremity edema over the last couple of days.  No orthopnea.  She takes torsemide at home and has had good urinary output with this.  In the ED, vitals were unremarkable.  Labs are significant for creatinine 2.17, WBC 11.2, hemoglobin 9.5, troponin 130 > 356.  EKG without ischemic changes.  Chest x-ray was unremarkable.  Patient with started on a heparin drip and given IV Lasix x1. Hospitalists were called for admission.  PAST MEDICAL HISTORY:   Past Medical History:  Diagnosis Date  . Autosomal recessive polycystic kidneys   . Avascular necrosis of hip (HCC)    bilateral  . CAD (coronary artery disease)    mild  . CHF (congestive heart failure) ([place])   . Chronic kidney disease    cyst  . COPD (chronic obstructive pulmonary disease) ([place])   . Diabetes type 2, controlled ([place])   . GERD (gastroesophageal reflux disease)   . HTN (hypertension)   . Hypothyroidism    subclinical. low TSH, normal thyroid panel. biopsy 2010  . Vitamin B12 deficiency     PAST SURGICAL HISTORY:   Past Surgical History:   Procedure Laterality Date  . CHOLECYSTECTOMY    . hip replacement     bilateral-secondary to avascular necrosis  . right eye lens replacement    . ULNAR NERVE REPAIR     bilateral  . VESICOVAGINAL FISTULA CLOSURE W/ TAH      SOCIAL HISTORY:   Social History   Tobacco Use  . Smoking status: Former Smoker    Packs/day: 1.00    Years: 50.00    Pack years: 50.00  . Smokeless tobacco: Never Used  . Tobacco comment: 1 ppd - 50 years   Substance Use Topics  . Alcohol use: No    FAMILY HISTORY:   Family History  Problem Relation Age of Onset  . Cancer Father        lung  . COPD Mother   . Heart disease Mother   . Heart disease Maternal Uncle     DRUG ALLERGIES:   Allergies  Allergen Reactions  . Other Rash    Pt reports allergy to metals.    REVIEW OF SYSTEMS:   Review of Systems  Constitutional: Negative for chills and fever.  HENT: Negative for congestion and sore throat.   Eyes: Negative for blurred vision and double vision.  Respiratory: Negative for cough and shortness of breath.   Cardiovascular: Positive for chest pain and leg swelling. Negative for palpitations.  Gastrointestinal: Negative for nausea and vomiting.  Genitourinary: Negative for dysuria  and urgency.  Musculoskeletal: Negative for back pain and neck pain.  Neurological: Negative for dizziness and headaches.  Psychiatric/Behavioral: Negative for depression. The patient is not nervous/anxious.     MEDICATIONS AT HOME:   Prior to Admission medications   Medication Sig Start Date End Date Taking? Authorizing Provider  albuterol (VENTOLIN HFA) 108 (90 BASE) MCG/ACT inhaler Inhale 2 puffs into the lungs every 4 (four) hours as needed.    Yes [provider]  amLODipine (NORVASC) 10 MG tablet Take 10 mg by mouth daily.     Yes [provider]  atorvastatin (LIPITOR) 40 MG tablet Take 40 mg by mouth every evening.    Yes [provider]  busPIRone (BUSPAR) 5 MG tablet  Take 5 mg by mouth 2 (two) times daily.   Yes [provider]  Cholecalciferol (VITAMIN D3) 5000 units CAPS Take 1 capsule by mouth daily.   Yes [provider]  Cyanocobalamin 1500 MCG TBDP Take 3 tablets by mouth daily.   Yes [provider]  FLUoxetine (PROZAC) 40 MG capsule Take 40 mg by mouth every evening.    Yes [provider]  Fluticasone-Umeclidin-Vilant (TRELEGY ELLIPTA) 100-62.5-25 MCG/INH AEPB Inhale 1 puff into the lungs daily.   Yes [provider]  HUMALOG KWIKPEN 100 UNIT/ML KwikPen Inject 0-22 Units into the skin 3 (three) times daily with meals. Sliding scale 10/26/18  Yes [provider]  ipratropium-albuterol (DUONEB) 0.5-2.5 (3) MG/3ML SOLN Take 3 mLs by nebulization 4 (four) times daily. 03/28/16  Yes Vaughan Basta, MD  LEVEMIR FLEXTOUCH 100 UNIT/ML Pen Inject 43 Units into the skin at bedtime.    Yes [provider]  metolazone (ZAROXOLYN) 2.5 MG tablet Take 2.5 mg by mouth daily.   Yes [provider]  omeprazole (PRILOSEC) 20 MG capsule Take 20 mg by mouth daily.   Yes [provider]  oxyCODONE-acetaminophen (PERCOCET) 10-325 MG tablet Take 1 tablet by mouth 3 (three) times daily as needed for pain.  12/08/17  Yes [provider]  promethazine (PHENERGAN) 25 MG tablet Take 25 mg by mouth every 6 (six) hours as needed for nausea.    Yes [provider]  tiZANidine (ZANAFLEX) 4 MG tablet Take 4 mg by mouth 3 (three) times daily as needed for muscle spasms.    Yes [provider]  torsemide (DEMADEX) 10 MG tablet Take 2 tablets (20 mg total) by mouth daily as needed (leg swelling). Patient taking differently: Take 20 mg by mouth every other day.  01/10/18  Yes Vaughan Basta, MD  traZODone (DESYREL) 50 MG tablet Take 50 mg by mouth at bedtime. 08/10/16  Yes [provider]      VITAL SIGNS:  Blood pressure 123/75, pulse 99, temperature 98.8 F  (37.1 C), temperature source Oral, resp. rate (!) 25, height 5\' 5"  (1.651 m), weight 96.2 kg, SpO2 100 %.  PHYSICAL EXAMINATION:  Physical Exam  GENERAL:  78 y.o.-year-old patient lying in the bed with no acute distress.  EYES: Pupils equal, round, reactive to light and accommodation. No scleral icterus. Extraocular muscles intact.  HEENT: Head atraumatic, normocephalic. Oropharynx and nasopharynx clear.  NECK:  Supple, no jugular venous distention. No thyroid enlargement, no tenderness.  LUNGS: + Diminished breath sounds in lung bases bilaterally.  No use of accessory muscles of respiration.  Nasal cannula in place. CARDIOVASCULAR: Mildly tachycardic, regular rhythm, S1, S2 normal. No murmurs, rubs, or gallops.  ABDOMEN: Soft, nontender, nondistended. Bowel sounds present. No organomegaly or  mass.  EXTREMITIES: No cyanosis, or clubbing. 2+ pitting edema to the thighs bilaterally. NEUROLOGIC: Cranial nerves II through XII are intact. Muscle strength 5/5 in all extremities. Sensation intact. Gait not checked.  PSYCHIATRIC: The patient is alert and oriented x 3.  SKIN: No obvious rash, lesion, or ulcer.   LABORATORY PANEL:   CBC Recent Labs  Lab 11/08/18 1010  WBC 11.2*  HGB 9.5*  HCT 32.0*  PLT 404*   ------------------------------------------------------------------------------------------------------------------  Chemistries  Recent Labs  Lab 11/08/18 1010  NA 140  K 3.7  CL 101  CO2 29  GLUCOSE 152*  BUN 30*  CREATININE 2.17*  CALCIUM 8.8*   ------------------------------------------------------------------------------------------------------------------  Cardiac Enzymes No results for input(s): TROPONINI in the last 168 hours. ------------------------------------------------------------------------------------------------------------------  RADIOLOGY:  Dg Chest 2 View  Result Date: 11/08/2018 CLINICAL DATA:  78 year old female with midsternal chest pain since  last night. Pain radiating to the left shoulder. EXAM: CHEST - 2 VIEW COMPARISON:  Chest radiographs 02/10/2018 and earlier. FINDINGS: Stable lung volumes. Stable cardiac size at the upper limits of normal. Chronic linear scarring at both lung bases, and the costophrenic angles. Stable ventilation. No pneumothorax, pulmonary edema or acute pulmonary opacity. Visualized tracheal air column is within normal limits. No acute osseous abnormality identified. Negative visible bowel gas pattern. IMPRESSION: Chronic lung base scarring.  No acute cardiopulmonary abnormality. Electronically Signed   By: Genevie Ann M.D.   On: 11/08/2018 11:00      IMPRESSION AND PLAN:   Chest pain- likely NSTEMI. Troponin 130 > 356.  EKG without ischemic changes.  Patient does not have a cardiologist. -Continue heparin gtt -Cardiology consulted -Continue home Lipitor -ECHO ordered -Trend troponins -Will give a dose of aspirin 325 mg now, then continue aspirin 81 mg daily -Start low dose coreg -Check lipid panel and A1c -Cardiac monitoring  Acute on chronic congestive heart failure with midrange EF- appears mildly volume overloaded. -ECHO -Received Lasix x1 in the ED, will hold off on additional doses of Lasix given her worsening renal function  AKI in CKD III- creatinine 2.17, baseline is 1.6-1.8. -Avoid nephrotoxic agents  Hypertension-BP normal in the ED -Continue home Norvasc  Type 2 diabetes- blood sugar elevated in the ED -Levemir 20 units qhs and moderate SSI  Chronic respiratory failure due to COPD-on 3 L O2 at baseline.  No signs of acute exacerbation. -Continue home inhalers  Depression/anxiety-stable -Continue home Buspar, Prozac, trazodone  Chronic normocytic anemia-hemoglobin at baseline -Monitor  All the records are reviewed and case discussed with ED provider. Management plans discussed with the patient, family and they are in agreement.  CODE STATUS: DNR  TOTAL TIME TAKING CARE OF THIS  PATIENT: 45 minutes.    Berna Spare Mayo M.D on 11/08/2018 at 2:03 PM  Between 7am to 6pm - Pager [phone]  After 6pm go to [email or web address] - Proofreader  [facility]  Office  [phone]  CC: Primary care physician; Denton Lank, MD   Note: This dictation was prepared with Dragon dictation along with smaller phrase technology. Any transcriptional errors that result from this process are unintentional.

## 2018-11-08 NOTE — Progress Notes (Signed)
Notify Dr. Brett Albino about the last troponin level at 533, also patient still complaining of chest pain, did gave SL nitro awhile ago which patient say it helps. Also patient states whenever she takes oxycodone or narcotics, it makes her nauseated and she usually takes phenergan at home, order for PRN given. RN will continue to monitor.

## 2018-11-08 NOTE — ED Notes (Signed)
Report given to Tasha, RN.

## 2018-11-08 NOTE — ED Notes (Signed)
[facility] called and given HIPPA compliant update

## 2018-11-08 NOTE — Consult Note (Signed)
Cardiology Consultation:   Patient ID: Mckenzie Taylor MRN: [id number]; DOB: [birth year]  Admit date: 11/08/2018 Date of Consult: 11/08/2018  Primary Care Provider: Denton Lank, MD Primary Cardiologist: Previously [facility] cardiology. [facility] rounding, Dr. Rockey Situ Primary Electrophysiologist:  None    Patient Profile:   Mckenzie Taylor is a 78 y.o. female with a hx of mild nonobstructive CAD (cath 2004), hypertension, hyperlipidemia, HFrEF, COPD on 3L home oxygen, GERD with B12 deficiency, hypothyroidism (bx 2010, subclinical), CKD, autosomal recessive PCKD (h/o adrenal adenoma, 2010), anemia of chronic disease, DM2, history of bilateral avascular necrosis of the hip, 5cm popliteal fossa cyst, and prior history of smoking 1 pack a day since the age of 85 (quit ~ 2015) who is being seen today for the evaluation of atypical chest pain with elevated troponin at the request of Dr. Brett Albino.  History of Present Illness:   Mckenzie Taylor is a 78 year old female with PMH as above.  Seen by [facility] 12/2008.  She was admitted to [facility] 10/2002 with complaint of chest pain and mild congestive heart failure.  07/02/2008 echocardiogram showed mild LVH and EF 60 to 65%. She underwent a diagnostic left heart cardiac catheterization during this hospitalization that showed 20% mid LAD stenosis, but otherwise no evidence of coronary artery disease.   At follow-up in the clinic after discharge, she complained of dyspnea with minimal exertion.  When seen 12/2008, she complained of continued shortness of breath, dyspnea on exertion, as well as fatigue.  She also noted dependent edema during the day, typically resolving at night.  She denied palpitations, dizziness, near-syncope, syncope, orthopnea, or PND.  She occasionally noted slight and sharp chest pain, lasting for only 2 to 3 seconds.  No associated diaphoresis, nausea, shortness of breath.  She also reported bilateral leg pain when ambulating, which did not sound consistent with  leg cramping, as it began in her hips and shot down the lateral aspects of her legs.  This pain improved with rest. Weight was documented at 205 lbs. Vitals showed HR 105bpm and tachycardic with subsequent EKG sinus tachycardia at a rate of 102 bpm. BP 136/83.  Exam was notable for 1+ bilateral lower extremity edema, as well as 1+ bilateral pulses DP/PT.  Plan was to continue aspirin, amlodipine, ACEi. Given her bilateral leg pain, it was noted arterial Dopplers should be obtained to exclude obstructive lower extremity arterial disease.  It was found subsequently she had bilateral avascular necrosis of the hip. Smoking cessation was discussed with patient subsequently quitting in approximately 2015 after 1 pack a day since the age of 27 years.   She presented to [facility] ED 08/2016 with [facility] cardiology consultation after she presented to the hospital with significant cough and congestion, consistent with pneumonia.  She was noted to be hypoxic with chest x-ray consistent with mild congestion.  Echocardiogram showed mild global LV systolic dysfunction with ejection fraction of 45%.  Troponin normal.  BNP 317.  EKG showed sinus tachycardia but without acute changes.   Patient reported that she has been in her usual state of health until 2 months ago.  She reported that is the last time that she felt at her baseline.  Approximately 2 weeks ago, the patient noted an increase in her weight to approximately 215 pounds. She also noted progressive lower extremity edema, abdominal distention, dyspnea on exertion, and shortness of breath.  She denied any increase in fluids or change in diet.  At approximately 9:30 PM on 8/25, she reported moderate to severe  substernal chest pressure, rated 6/10 in severeity.  She also reported associated diaphoresis and "dry heaving." She reportedly thought the CP might be her GERD; however, she continued to take her omeprazole daily and noted the pain not associated with a recent meal.  She  denied feeling CP like this in the past. She also noted it TTP when pressing on the center of her chest.  At one point, she noted the pain spread to her left arm, but it did not remain there for very long.  The pain was not worse with deep breath or position.  Her chest discomfort lasted throughout the night and into the morning, prompting the patient to present to [facility] ED.   In the emergency department, vitals were significant for heart rate 102 bpm.  EKG showed sinus tachycardia 100 bpm, IVCD, nonspecific repolarization changes, but no acute ST/T changes from baseline as below.  Chest x-ray without acute abnormality.  Labs are significant for Cr 2.17, BUN 30, Troponin 130  356. WBC 11.2, Hgb 9.5. She was started on IV heparin and IV lasix. After receiving sublingual nitroglycerin, the patient reported significant improvement in her chest pain/pressure, rated 3/10 in severity at the time of cardiac consultation (no radiation to L arm at time of consultation).  She continued to note ongoing shortness of breath and dizziness.   Heart Pathway Score:     Past Medical History:  Diagnosis Date  . Autosomal recessive polycystic kidneys   . Avascular necrosis of hip (HCC)    bilateral  . CAD (coronary artery disease)    mild  . CHF (congestive heart failure) ([place])   . Chronic kidney disease    cyst  . COPD (chronic obstructive pulmonary disease) ([place])   . Diabetes type 2, controlled ([place])   . GERD (gastroesophageal reflux disease)   . HTN (hypertension)   . Hypothyroidism    subclinical. low TSH, normal thyroid panel. biopsy 2010  . Vitamin B12 deficiency     Past Surgical History:  Procedure Laterality Date  . CHOLECYSTECTOMY    . hip replacement     bilateral-secondary to avascular necrosis  . right eye lens replacement    . ULNAR NERVE REPAIR     bilateral  . VESICOVAGINAL FISTULA CLOSURE W/ TAH       Home Medications:  Prior to Admission medications   Medication Sig Start Date End  Date Taking? Authorizing Provider  albuterol (VENTOLIN HFA) 108 (90 BASE) MCG/ACT inhaler Inhale 2 puffs into the lungs every 4 (four) hours as needed.    Yes [provider]  amLODipine (NORVASC) 10 MG tablet Take 10 mg by mouth daily.     Yes [provider]  atorvastatin (LIPITOR) 40 MG tablet Take 40 mg by mouth every evening.    Yes [provider]  busPIRone (BUSPAR) 5 MG tablet Take 5 mg by mouth 2 (two) times daily.   Yes [provider]  Cholecalciferol (VITAMIN D3) 5000 units CAPS Take 1 capsule by mouth daily.   Yes [provider]  Cyanocobalamin 1500 MCG TBDP Take 3 tablets by mouth daily.   Yes [provider]  FLUoxetine (PROZAC) 40 MG capsule Take 40 mg by mouth every evening.    Yes [provider]  Fluticasone-Umeclidin-Vilant (TRELEGY ELLIPTA) 100-62.5-25 MCG/INH AEPB Inhale 1 puff into the lungs daily.   Yes [provider]  HUMALOG KWIKPEN 100 UNIT/ML KwikPen Inject 0-22 Units into the skin 3 (three) times daily with meals. Sliding scale  10/26/18  Yes [provider]  ipratropium-albuterol (DUONEB) 0.5-2.5 (3) MG/3ML SOLN Take 3 mLs by nebulization 4 (four) times daily. 03/28/16  Yes Vaughan Basta, MD  LEVEMIR FLEXTOUCH 100 UNIT/ML Pen Inject 43 Units into the skin at bedtime.    Yes [provider]  metolazone (ZAROXOLYN) 2.5 MG tablet Take 2.5 mg by mouth daily.   Yes [provider]  omeprazole (PRILOSEC) 20 MG capsule Take 20 mg by mouth daily.   Yes [provider]  oxyCODONE-acetaminophen (PERCOCET) 10-325 MG tablet Take 1 tablet by mouth 3 (three) times daily as needed for pain.  12/08/17  Yes [provider]  promethazine (PHENERGAN) 25 MG tablet Take 25 mg by mouth every 6 (six) hours as needed for nausea.    Yes [provider]  tiZANidine (ZANAFLEX) 4 MG tablet Take 4 mg by mouth 3 (three) times daily as needed for muscle spasms.    Yes  [provider]  torsemide (DEMADEX) 10 MG tablet Take 2 tablets (20 mg total) by mouth daily as needed (leg swelling). Patient taking differently: Take 20 mg by mouth every other day.  01/10/18  Yes Vaughan Basta, MD  traZODone (DESYREL) 50 MG tablet Take 50 mg by mouth at bedtime. 08/10/16  Yes [provider]    Inpatient Medications: Scheduled Meds: . amLODipine  10 mg Oral Daily  . aspirin  324 mg Oral NOW   Or  . aspirin  300 mg Rectal NOW  . [START ON 11/09/2018] aspirin EC  81 mg Oral Daily  . atorvastatin  40 mg Oral QPM  . busPIRone  5 mg Oral BID  . carvedilol  3.125 mg Oral BID WC  . FLUoxetine  40 mg Oral QPM  . fluticasone furoate-vilanterol  1 puff Inhalation Daily   And  . umeclidinium bromide  1 puff Inhalation Daily  . insulin aspart  0-15 Units Subcutaneous TID WC  . insulin aspart  0-5 Units Subcutaneous QHS  . insulin detemir  20 Units Subcutaneous QHS  . pantoprazole  40 mg Oral Daily  . traZODone  50 mg Oral QHS   Continuous Infusions: . heparin 1,050 Units/hr (11/08/18 1348)   PRN Meds: acetaminophen, albuterol, ipratropium-albuterol, nitroGLYCERIN, ondansetron (ZOFRAN) IV, oxyCODONE-acetaminophen **AND** oxyCODONE, tiZANidine  Allergies:    Allergies  Allergen Reactions  . Other Rash    Pt reports allergy to metals.    Social History:   Social History   Socioeconomic History  . Marital status: Married    Spouse name: Not on file  . Number of children: Not on file  . Years of education: Not on file  . Highest education level: Not on file  Occupational History  . Not on file  Social Needs  . Financial resource strain: Not on file  . Food insecurity    Worry: Not on file    Inability: Not on file  . Transportation needs    Medical: Not on file    Non-medical: Not on file  Tobacco Use  . Smoking status: Former Smoker    Packs/day: 1.00    Years: 50.00    Pack years: 50.00  . Smokeless tobacco: Never Used  .  Tobacco comment: 1 ppd - 50 years   Substance and Sexual Activity  . Alcohol use: No  . Drug use: No  . Sexual activity: Not Currently  Lifestyle  . Physical activity    Days per week: Not on file    Minutes per session: Not  on file  . Stress: Not on file  Relationships  . Social Herbalist on phone: Not on file    Gets together: Not on file    Attends religious service: Not on file    Active member of club or organization: Not on file    Attends meetings of clubs or organizations: Not on file    Relationship status: Not on file  . Intimate partner violence    Fear of current or ex partner: Not on file    Emotionally abused: Not on file    Physically abused: Not on file    Forced sexual activity: Not on file  Other Topics Concern  . Not on file  Social History Narrative   Separated, has 2 adult children.    Retired Regulatory affairs officer, on disability.     Family History:     Family History  Problem Relation Age of Onset  . Cancer Father        lung  . COPD Mother   . Heart disease Mother   . Heart disease Maternal Uncle      ROS:  Please see the history of present illness.  Review of Systems  Constitutional: Positive for diaphoresis and malaise/fatigue.       Weight gain  Respiratory: Positive for shortness of breath.   Cardiovascular: Positive for chest pain, orthopnea and leg swelling. Negative for palpitations.  Gastrointestinal: Positive for heartburn, nausea and vomiting.  Neurological: Positive for dizziness.    All other ROS reviewed and negative.     Physical Exam/Data:   Vitals:   11/08/18 1330 11/08/18 1500 11/08/18 1502 11/08/18 1558  BP: 123/75 134/83  135/78  Pulse: 99  (!) 101 98  Resp: (!) 25 20 20 20   Temp:    98.6 F (37 C)  TempSrc:    Oral  SpO2: 100%  99% 96%  Weight:      Height:       No intake or output data in the 24 hours ending 11/08/18 1625 Last 3 Weights 11/08/2018 02/10/2018 02/10/2018  Weight (lbs) 212 lb 229 lb 12.8 oz  217 lb  Weight (kg) 96.163 kg 104.237 kg 98.431 kg     Body mass index is 35.28 kg/m.  General: Elderly female, sitting on the edge of the bed, in no acute distress HEENT: normal, nasal cannula in place on 3 L oxygen (consistent with home 3 L) Neck: JVD difficult to assess due to body habitus Vascular: radial pulses 2+ bilaterally Cardiac:  normal S1, S2; soft heart sounds, tachycardic but regular, extrasystole noted; no murmur  Lungs: reduced bibasilar breath sounds, poor inspiratory effort Abd: soft, nontender, no hepatomegaly  Ext: 1+ bilateral pitting edema, extending to the thigh Musculoskeletal:  No deformities, BUE and BLE strength normal and equal Skin: warm and dry  Neuro:  No focal abnormalities noted Psych:  Normal affect   EKG:  The EKG was personally reviewed and demonstrates:  EKG showed normal sinus rhythm, 100 bpm, nonspecific repolarization changes, poor R wave progression, consider also changes due to lead placement, no acute ST or T changes Telemetry:  Telemetry was personally reviewed and demonstrates: Sinus tachycardia  Relevant CV Studies:  Pending updated echo   Jul 04, 2016 Echo - Procedure narrative: Transthoracic echocardiography. Image   quality was poor. The study was technically difficult, as a   result of poor acoustic windows and poor sound wave transmission.   Intravenous contrast (Definity) was administered. - Left ventricle: The cavity size  was normal. Systolic function was   mildly reduced. The estimated ejection fraction was in the range   of 45% to 50%.  2010 Echo Study Conclusions  Left ventricle: Wall thickness was increased in a pattern of mild  LVH. Systolic function was normal. The estimated ejection fraction  was in the range of 60% to 65%.  Impressions:   - This echo is essentially uninterpretable due to poor quality  images. The LV and RV function appear normal. No obvious valvular  pathology however color Doppler quality is poor  and  uninterpretable. Suggest repeat imaging on better quality machine.   Laboratory Data:  High Sensitivity Troponin:   Recent Labs  Lab 11/08/18 1010 11/08/18 1306  TROPONINIHS 130* 356*     Cardiac EnzymesNo results for input(s): TROPONINI in the last 168 hours. No results for input(s): TROPIPOC in the last 168 hours.  Chemistry Recent Labs  Lab 11/08/18 1010  NA 140  K 3.7  CL 101  CO2 29  GLUCOSE 152*  BUN 30*  CREATININE 2.17*  CALCIUM 8.8*  GFRNONAA 21*  GFRAA 25*  ANIONGAP 10    No results for input(s): PROT, ALBUMIN, AST, ALT, ALKPHOS, BILITOT in the last 168 hours. Hematology Recent Labs  Lab 11/08/18 1010  WBC 11.2*  RBC 3.57*  HGB 9.5*  HCT 32.0*  MCV 89.6  MCH 26.6  MCHC 29.7*  RDW 17.5*  PLT 404*   BNPNo results for input(s): BNP, PROBNP in the last 168 hours.  DDimer No results for input(s): DDIMER in the last 168 hours.   Radiology/Studies:  Dg Chest 2 View  Result Date: 11/08/2018 CLINICAL DATA:  78 year old female with midsternal chest pain since last night. Pain radiating to the left shoulder. EXAM: CHEST - 2 VIEW COMPARISON:  Chest radiographs 02/10/2018 and earlier. FINDINGS: Stable lung volumes. Stable cardiac size at the upper limits of normal. Chronic linear scarring at both lung bases, and the costophrenic angles. Stable ventilation. No pneumothorax, pulmonary edema or acute pulmonary opacity. Visualized tracheal air column is within normal limits. No acute osseous abnormality identified. Negative visible bowel gas pattern. IMPRESSION: Chronic lung base scarring.  No acute cardiopulmonary abnormality. Electronically Signed   By: Genevie Ann M.D.   On: 11/08/2018 11:00    Assessment and Plan:   Atypical Chest pain, elevated troponin --Chest pain current and improved with SL nitro to 3/10 (previously 6/10). No further radiation to L arm. Some atypical features, including TTP when pressing in the center of her chest, more consistent with  MSK etiology. 2004 cath with mild nonobstructive CAD as above.  --EKG sinus tachycardia, repolarization changes / IVCD, no acute ST/T changes. Hs Tn 130  356. HS Tn mildly elevated and will continue to cycle until peaked and down-trending. EKG and HS Tn more consistent with supply demand ischemia at this time in the setting of elevated BP, sinus tachycardia, and volume overload. Will continue to cycle Tn, as patient does have significant risk factors for cardiac etiology, including former smoker, CAD history, HTN, HLD, DM2. Cannot completely rule out cardiac etiology at this time.  --Will check a TSH, A1C, lipid panel for further risk stratification. --Recommend further ischemic workup with stress test at this time. Given her current troponin trend and somewhat atypical CP presentation, no plan at this time for emergent cardiac catheterization unless significant jump in subsequent troponins, increased chest pain, or updated echo shows significantly reduced EF or acute structural changes.  --Continue medical management with ASA, BB, and  statin therapy.   HTN --Continue medical management. Titrate amlodipine and Coreg for optimal BP control.  HLD --Continue statin. Update LDL, liver function.  HFrEF (EF 45-50%) --Volume overloaded on exam. Patient also reporting elevated wt with most recent weight elevated to 212lbs, increased from previous recorded weight of 205lbs as above. Consider SOB may be multifactorial as well given COPD on home oxygen. --Update echo as above. Previous EF 45-50%.  --Continue IV lasix and titrate as needed until euvolemic on exam or as renal function allows. Closely monitor renal function and electrolytes with diuresis. Daily standing weights. Strict I/Os. Continue O2 and breathing treatments.  --Continue BB. Consider Xopenex with breathing treatments, as less likely to elevate rates than albuterol and current sinus tachycardia.  --Do not recommend ACE/ARB/ARNI/spiro/entresto  at this time given AKI and current renal function.  --Daily BMET. Will need to closely monitor renal function with diuresis, as well as consider renal function with addition of evidence based medicine.  AKI, PCKD, CKD --Daily BMET. Cr 2.17 and elevated from 01/2018 Cr 1.60.  Hypokalemia --Replete with goal 4.0. Check Mg.  Anemia of chronic disease --Hgb 9.5 but chronically low with baseline Hgb 8.0-9.0.    For questions or updates, please contact [place] Please consult [email or web address] for contact info under     Signed, Arvil Chaco, PA-C  11/08/2018 4:25 PM

## 2018-11-09 ENCOUNTER — Inpatient Hospital Stay (HOSPITAL_COMMUNITY)
Admit: 2018-11-09 | Discharge: 2018-11-09 | Disposition: A | Discharge status: Home / Self Care | Payer: Medicare Other | Attending: Internal Medicine | Admitting: Internal Medicine

## 2018-11-09 DIAGNOSIS — I214 Non-ST elevation (NSTEMI) myocardial infarction: Secondary | ICD-10-CM

## 2018-11-09 DIAGNOSIS — R079 Chest pain, unspecified: Secondary | ICD-10-CM

## 2018-11-09 DIAGNOSIS — I361 Nonrheumatic tricuspid (valve) insufficiency: Secondary | ICD-10-CM

## 2018-11-09 DIAGNOSIS — D500 Iron deficiency anemia secondary to blood loss (chronic): Secondary | ICD-10-CM

## 2018-11-09 LAB — ECHOCARDIOGRAM COMPLETE
Height: 65 in
Weight: 3387.2 oz

## 2018-11-09 LAB — NOVEL CORONAVIRUS, NAA (HOSP ORDER, SEND-OUT TO REF LAB; TAT 18-24 HRS): SARS-CoV-2, NAA: NOT DETECTED

## 2018-11-09 LAB — OCCULT BLOOD X 1 CARD TO LAB, STOOL: Fecal Occult Bld: POSITIVE — AB

## 2018-11-09 LAB — IRON AND TIBC
Iron: 37 ug/dL (ref 28–170)
Saturation Ratios: 10 % — ABNORMAL LOW (ref 10.4–31.8)
TIBC: 356 ug/dL (ref 250–450)
UIBC: 319 ug/dL

## 2018-11-09 LAB — BASIC METABOLIC PANEL
Anion gap: 10 (ref 5–15)
BUN: 33 mg/dL — ABNORMAL HIGH (ref 8–23)
CO2: 30 mmol/L (ref 22–32)
Calcium: 8.5 mg/dL — ABNORMAL LOW (ref 8.9–10.3)
Chloride: 100 mmol/L (ref 98–111)
Creatinine, Ser: 2.3 mg/dL — ABNORMAL HIGH (ref 0.44–1.00)
GFR calc Af Amer: 23 mL/min — ABNORMAL LOW (ref >60)
GFR calc non Af Amer: 20 mL/min — ABNORMAL LOW (ref >60)
Glucose, Bld: 147 mg/dL — ABNORMAL HIGH (ref 70–99)
Potassium: 3.1 mmol/L — ABNORMAL LOW (ref 3.5–5.1)
Sodium: 140 mmol/L (ref 135–145)

## 2018-11-09 LAB — CBC
HCT: 31.1 % — ABNORMAL LOW (ref 36.0–46.0)
Hemoglobin: 9.3 g/dL — ABNORMAL LOW (ref 12.0–15.0)
MCH: 26.6 pg (ref 26.0–34.0)
MCHC: 29.9 g/dL — ABNORMAL LOW (ref 30.0–36.0)
MCV: 88.9 fL (ref 80.0–100.0)
Platelets: 366 10*3/uL (ref 150–400)
RBC: 3.5 MIL/uL — ABNORMAL LOW (ref 3.87–5.11)
RDW: 17.3 % — ABNORMAL HIGH (ref 11.5–15.5)
WBC: 10.3 10*3/uL (ref 4.0–10.5)
nRBC: 0 % (ref 0.0–0.2)

## 2018-11-09 LAB — GLUCOSE, CAPILLARY
Glucose-Capillary: 136 mg/dL — ABNORMAL HIGH (ref 70–99)
Glucose-Capillary: 230 mg/dL — ABNORMAL HIGH (ref 70–99)
Glucose-Capillary: 122 mg/dL — ABNORMAL HIGH (ref 70–99)
Glucose-Capillary: 160 mg/dL — ABNORMAL HIGH (ref 70–99)

## 2018-11-09 LAB — TROPONIN I (HIGH SENSITIVITY): Troponin I (High Sensitivity): 548 ng/L (ref <18)

## 2018-11-09 LAB — LIPID PANEL
Cholesterol: 192 mg/dL (ref 0–200)
HDL: 31 mg/dL — ABNORMAL LOW (ref >40)
LDL Cholesterol: UNDETERMINED mg/dL (ref 0–99)
Total CHOL/HDL Ratio: 6.2 RATIO
Triglycerides: 465 mg/dL — ABNORMAL HIGH (ref <150)
VLDL: UNDETERMINED mg/dL (ref 0–40)

## 2018-11-09 LAB — HEPARIN LEVEL (UNFRACTIONATED)
Heparin Unfractionated: <0.1 IU/mL — ABNORMAL LOW (ref 0.30–0.70)
Heparin Unfractionated: 0.24 IU/mL — ABNORMAL LOW (ref 0.30–0.70)

## 2018-11-09 LAB — LDL CHOLESTEROL, DIRECT: Direct LDL: 99.3 mg/dL — ABNORMAL HIGH (ref 0–99)

## 2018-11-09 LAB — FERRITIN: Ferritin: 27 ng/mL (ref 11–307)

## 2018-11-09 MED ORDER — HEPARIN BOLUS VIA INFUSION
2400.0000 [IU] | Freq: Once | INTRAVENOUS | Status: AC
  Administered 2018-11-09: 2400 [IU] via INTRAVENOUS
  Filled 2018-11-09: qty 2400

## 2018-11-09 MED ORDER — HEPARIN BOLUS VIA INFUSION
1200.0000 [IU] | Freq: Once | INTRAVENOUS | Status: AC
  Administered 2018-11-09: 19:00:00 1200 [IU] via INTRAVENOUS
  Filled 2018-11-09: qty 1200

## 2018-11-09 MED ORDER — POTASSIUM CHLORIDE CRYS ER 20 MEQ PO TBCR
40.0000 meq | EXTENDED_RELEASE_TABLET | Freq: Once | ORAL | Status: AC
  Administered 2018-11-09: 40 meq via ORAL
  Filled 2018-11-09: qty 2

## 2018-11-09 NOTE — Progress Notes (Signed)
*  PRELIMINARY RESULTS* Echocardiogram 2D Echocardiogram has been performed.  Mckenzie Taylor 11/09/2018, 9:45 AM

## 2018-11-09 NOTE — Progress Notes (Signed)
Progress Note  Patient Name: Mckenzie Taylor Date of Encounter: 11/09/2018  Primary Cardiologist: Ida Rogue, MD   Subjective   Chest feels tight this AM but denies chest pain. Reported her LEE decreased overnight, which is typical for her. She still c/o abdominal distention, however. Her breathing has improved s/p breathing treatment earlier this AM. She is still fatigued this AM, having not slept well overnight due to being in the hospital.  Inpatient Medications    Scheduled Meds: . amLODipine  10 mg Oral Daily  . aspirin  324 mg Oral NOW   Or  . aspirin  300 mg Rectal NOW  . aspirin EC  81 mg Oral Daily  . atorvastatin  40 mg Oral QPM  . busPIRone  5 mg Oral BID  . carvedilol  3.125 mg Oral BID WC  . FLUoxetine  40 mg Oral QPM  . fluticasone furoate-vilanterol  1 puff Inhalation Daily   And  . umeclidinium bromide  1 puff Inhalation Daily  . furosemide  40 mg Intravenous BID  . insulin aspart  0-15 Units Subcutaneous TID WC  . insulin aspart  0-5 Units Subcutaneous QHS  . insulin detemir  20 Units Subcutaneous QHS  . pantoprazole  40 mg Oral Daily  . traZODone  50 mg Oral QHS   Continuous Infusions: . heparin 1,200 Units/hr (11/09/18 0533)   PRN Meds: acetaminophen, albuterol, ipratropium-albuterol, morphine injection, nitroGLYCERIN, ondansetron (ZOFRAN) IV, oxyCODONE-acetaminophen **AND** oxyCODONE, promethazine, tiZANidine   Vital Signs    Vitals:   11/08/18 1616 11/08/18 1932 11/09/18 0538 11/09/18 0820  BP:  (!) 147/82 122/73 123/61  Pulse:  99 94 91  Resp:  20  18  Temp:  98 F (36.7 C) 98.5 F (36.9 C) 98.2 F (36.8 C)  TempSrc:  Oral Oral Oral  SpO2:  98% 97% 95%  Weight: 96 kg     Height: 5\' 5"  (1.651 m)       Intake/Output Summary (Last 24 hours) at 11/09/2018 0906 Last data filed at 11/09/2018 0844 Gross per 24 hour  Intake 169.83 ml  Output 2150 ml  Net -1980.17 ml   Last 3 Weights 11/08/2018 11/08/2018 02/10/2018  Weight (lbs) 211  lb 11.2 oz 212 lb 229 lb 12.8 oz  Weight (kg) 96.026 kg 96.163 kg 104.237 kg      Telemetry    SR-ST - Personally Reviewed  ECG    NSR, 98bpm, IVCD with nonspecific repolarization changes, no acute changes from 8/26 EKG with changes due to probable lead placement now resolved - Personally Reviewed  Physical Exam   GEN: No acute distress.  Still somnolent having just woken for the AM. Neck: JVD difficult to assess due to body habitus Cardiac: regular, slightly tachycardic, no murmurs, rubs, or gallops.  Respiratory:Improved breath sounds and expiratory wheezing from yesterday, reduced bibasilar breath sounds. GI: somewhat distended, abdominal hernia, non-tender MS: Improved lower extremity edema, mild to 1+; No deformity. Neuro:  Nonfocal  Psych: Normal affect   Labs    High Sensitivity Troponin:   Recent Labs  Lab 11/08/18 1306 11/08/18 1615 11/08/18 1755 11/08/18 2200 11/09/18 0015  TROPONINIHS 356* 533* 584* 556* 548*      Cardiac EnzymesNo results for input(s): TROPONINI in the last 168 hours. No results for input(s): TROPIPOC in the last 168 hours.   Chemistry Recent Labs  Lab 11/08/18 1010 11/08/18 2200 11/09/18 0015  NA 140  --  140  K 3.7  --  3.1*  CL 101  --  100  CO2 29  --  30  GLUCOSE 152*  --  147*  BUN 30*  --  33*  CREATININE 2.17*  --  2.30*  CALCIUM 8.8*  --  8.5*  PROT  --  6.6  --   ALBUMIN  --  3.3*  --   AST  --  18  --   ALT  --  19  --   ALKPHOS  --  64  --   BILITOT  --  0.2*  --   GFRNONAA 21*  --  20*  GFRAA 25*  --  23*  ANIONGAP 10  --  10     Hematology Recent Labs  Lab 11/08/18 1010 11/09/18 0015  WBC 11.2* 10.3  RBC 3.57* 3.50*  HGB 9.5* 9.3*  HCT 32.0* 31.1*  MCV 89.6 88.9  MCH 26.6 26.6  MCHC 29.7* 29.9*  RDW 17.5* 17.3*  PLT 404* 366    BNPNo results for input(s): BNP, PROBNP in the last 168 hours.   DDimer No results for input(s): DDIMER in the last 168 hours.   Radiology    Dg Chest 2  View  Result Date: 11/08/2018 CLINICAL DATA:  79 year old female with midsternal chest pain since last night. Pain radiating to the left shoulder. EXAM: CHEST - 2 VIEW COMPARISON:  Chest radiographs 02/10/2018 and earlier. FINDINGS: Stable lung volumes. Stable cardiac size at the upper limits of normal. Chronic linear scarring at both lung bases, and the costophrenic angles. Stable ventilation. No pneumothorax, pulmonary edema or acute pulmonary opacity. Visualized tracheal air column is within normal limits. No acute osseous abnormality identified. Negative visible bowel gas pattern. IMPRESSION: Chronic lung base scarring.  No acute cardiopulmonary abnormality. Electronically Signed   By: Genevie Ann M.D.   On: 11/08/2018 11:00    Cardiac Studies   Pending echo  2018 echo  - Procedure narrative: Transthoracic echocardiography. Image   quality was poor. The study was technically difficult, as a   result of poor acoustic windows and poor sound wave transmission.   Intravenous contrast (Definity) was administered. - Left ventricle: The cavity size was normal. Systolic function was   mildly reduced. The estimated ejection fraction was in the range   of 45% to 50%.  Patient Profile     78 y.o. female  with a hx of mild nonobstructive CAD (cath 2004), hypertension, hyperlipidemia, HFrEF, COPD on 3L home oxygen, GERD with B12 deficiency, hypothyroidism (bx 2010, subclinical), CKD, autosomal recessive PCKD (h/o adrenal adenoma, 2010), anemia of chronic disease, DM2, history of bilateral avascular necrosis of the hip, 5cm popliteal fossa cyst, and prior history of smoking 1 pack a day since the age of 51 (quit ~ 2015) who is being seen today for the evaluation of atypical chest pain with elevated troponin.  Assessment & Plan    Atypical Chest pain, elevated troponin --No chest pain this AM. Only chest tightness. Earlier CP with some atypical features, including TTP when pressing in the center of her  chest, more consistent with MSK etiology. 2004 cath with mild nonobstructive CAD as above.  --Recheck EKG shows IVCD/repolarization changes, no acute ST/T changes. Hs Tn peaked 584, now down-trending. --Consider Tn elevation due to supply demand ischemia in the setting of elevated BP, sinus tachycardia, and volume overload. --Cannot rule out Tn elevation due to cardiac etiology, given significant risk factors including former smoker, CAD, HTN, poorly controlled HLD, hypertriglyceridemia, poorly controlled DM2. --TSH 1.317, A1C 8.3, lipid panel unable to calculate LDL  with direct LDL 99.3, triglycerides 465 and total cholesterol 192 with HDL low at 31.  --Patient is not an ideal candidate for LHC given current renal function and poorly controlled comorbid conditions. No plan at this time for emergent cardiac catheterization this admission unless significant jump in subsequent troponins, return of chest pain, or updated echo shows significantly reduced EF or acute structural changes. Tentative plan for stress test later this week, and once volume and breathing status improved further. Restarted diet for now. Continue heparin for now, as well as medical management with ASA, BB, PRN SL nitro, morphine, and statin therapy for aggressive risk factor control. Continue PPI with ASA.  HTN --Continue medical management. Titrate amlodipine and Coreg for optimal BP control.  HLD/ Hypertriglycerdemia  --Continue statin. Direct LDL 99.3. Consider adjusting statin for more optimal control of LDL and goal <70. Triglycerides 465.  HFrEF (EF 45-50%) --Still slightly volume up, improved from yesterday. Not yet at baseline breathing and volume status, per patient.Consider SOB may be multifactorial as well given COPD on home oxygen. --Update echo as above. Previous EF 45-50%.  --Continue IV lasix and titrate as needed until euvolemic on exam or as renal function allows given elevated Cr. Closely monitor renal function  and electrolytes with diuresis. Daily BMET. Daily standing weights. Strict I/Os. Continue O2 and breathing treatments.  --Continue BB. Consider Xopenex given less effect on elevate rates than albuterol and as patient has intermittent sinus tachycardia.  --Do not recommend ACE/ARB/ARNI/spiro/entresto at this time given AKI and current renal function. Will need to consider renal function and electrolytes with addition of evidence based medicine.  AKI, PCKD, CKD --Daily BMET. Cr 2.17  2.30 with 01/2018 Cr 1.60. --Consider nephrology consultation.  Hypokalemia, hypomagnesemia  --K 3.1. Replete with goal 4.0.  --Mg 1.3. Replete with goal 2.0.  Anemia of chronic disease --Hgb 9.5  9.3 but chronically low with baseline Hgb 8.0-9.0. Consider also as contributing to SOB and LEE. Recommend continue to monitor given transfusion recommended below 8.0 and to ensure no acute bleed.  DM2 --Poorly controlled with A1C 8.3. --SSI --Per IM  For questions or updates, please contact [place] Please consult [email or web address] for contact info under        Signed, Arvil Chaco, PA-C  11/09/2018, 9:06 AM

## 2018-11-09 NOTE — Plan of Care (Signed)
  Problem: Education: Goal: Knowledge of General Education information will improve Description: Including pain rating scale, medication(s)/side effects and non-pharmacologic comfort measures Outcome: Progressing   Problem: Health Behavior/Discharge Planning: Goal: Ability to manage health-related needs will improve Outcome: Progressing   Problem: Clinical Measurements: Goal: Ability to maintain clinical measurements within normal limits will improve Outcome: Not Progressing Note: Potassium level is low today at only 3.1. PO replacement already administered. Will continue to monitor lab values. Wenda Low [facility]

## 2018-11-09 NOTE — Consult Note (Addendum)
ANTICOAGULATION CONSULT NOTE - Initial Consult  Pharmacy Consult for Heparin Drip Indication: chest pain/ACS  Allergies  Allergen Reactions  . Other Rash    Pt reports allergy to metals.    Patient Measurements: Height: 5\' 5"  (165.1 cm) Weight: 211 lb 11.2 oz (96 kg) IBW/kg (Calculated) : 57 Heparin Dosing Weight: 78.7 kg  Vital Signs: Temp: 98.6 F (37 C) (08/27 1548) Temp Source: Oral (08/27 1548) BP: 124/71 (08/27 1548) Pulse Rate: 96 (08/27 1548)  Labs: Recent Labs    11/08/18 1010  11/08/18 1341  11/08/18 1755 11/08/18 2200 11/09/18 0015 11/09/18 0756 11/09/18 1812  HGB 9.5*  --   --   --   --   --  9.3*  --   --   HCT 32.0*  --   --   --   --   --  31.1*  --   --   PLT 404*  --   --   --   --   --  366  --   --   APTT  --   --  29  --   --   --   --   --   --   LABPROT  --   --  12.8  --   --   --   --   --   --   INR  --   --  1.0  --   --   --   --   --   --   HEPARINUNFRC  --   --   --   --   --  <0.10*  --  <0.10* 0.24*  CREATININE 2.17*  --   --   --   --   --  2.30*  --   --   TROPONINIHS 130*   < >  --    < > 584* 556* 548*  --   --    < > = values in this interval not displayed.    Estimated Creatinine Clearance: 23.8 mL/min (A) (by C-G formula based on SCr of 2.3 mg/dL (H)).   Medical History: Past Medical History:  Diagnosis Date  . Anemia of chronic disease    Baseline hgb 8.0-8.9  . Autosomal recessive polycystic kidneys   . Avascular necrosis of hip (HCC)    bilateral  . CAD (coronary artery disease) 2004   2004 LHC with mild and nonobstructive CAD: 20% mid LAD stenosis  . Chronic combined systolic and diastolic heart failure (HCC)    HFrEF, EF 45% last echo, LVH, diastolic dysfunction 7858  . Chronic kidney disease    PCKD, CKD, adrenal adenoma, cyst  . COPD (chronic obstructive pulmonary disease) ([place])    3L home oxygen  . Diabetes type 2, controlled ([place])   . Former heavy tobacco smoker    1 pk / day. Estimates quit ~ 2015  .  GERD (gastroesophageal reflux disease)   . HTN (hypertension)   . Hyperlipidemia with target LDL less than 70   . Hypothyroidism    subclinical. low TSH, normal thyroid panel. biopsy 2010  . Vitamin B12 deficiency     Medications:  Medications Prior to Admission  Medication Sig Dispense Refill Last Dose  . albuterol (VENTOLIN HFA) 108 (90 BASE) MCG/ACT inhaler Inhale 2 puffs into the lungs every 4 (four) hours as needed.    prn at prn  . amLODipine (NORVASC) 10 MG tablet Take 10 mg by mouth daily.  11/07/2018 at Unknown time  . atorvastatin (LIPITOR) 40 MG tablet Take 40 mg by mouth every evening.    11/07/2018 at Unknown time  . busPIRone (BUSPAR) 5 MG tablet Take 5 mg by mouth 2 (two) times daily.   11/07/2018 at Unknown time  . Cholecalciferol (VITAMIN D3) 5000 units CAPS Take 1 capsule by mouth daily.   11/07/2018 at Unknown time  . Cyanocobalamin 1500 MCG TBDP Take 3 tablets by mouth daily.   11/07/2018 at Unknown time  . FLUoxetine (PROZAC) 40 MG capsule Take 40 mg by mouth every evening.    11/07/2018 at Unknown time  . Fluticasone-Umeclidin-Vilant (TRELEGY ELLIPTA) 100-62.5-25 MCG/INH AEPB Inhale 1 puff into the lungs daily.   11/07/2018 at Unknown time  . HUMALOG KWIKPEN 100 UNIT/ML KwikPen Inject 0-22 Units into the skin 3 (three) times daily with meals. Sliding scale   11/07/2018 at Unknown time  . ipratropium-albuterol (DUONEB) 0.5-2.5 (3) MG/3ML SOLN Take 3 mLs by nebulization 4 (four) times daily. 360 mL 0 11/07/2018 at Unknown time  . LEVEMIR FLEXTOUCH 100 UNIT/ML Pen Inject 43 Units into the skin at bedtime.    11/07/2018 at Unknown time  . metolazone (ZAROXOLYN) 2.5 MG tablet Take 2.5 mg by mouth daily.   11/07/2018 at Unknown time  . omeprazole (PRILOSEC) 20 MG capsule Take 20 mg by mouth daily.   11/07/2018 at Unknown time  . oxyCODONE-acetaminophen (PERCOCET) 10-325 MG tablet Take 1 tablet by mouth 3 (three) times daily as needed for pain.    prn at prn  . promethazine (PHENERGAN)  25 MG tablet Take 25 mg by mouth every 6 (six) hours as needed for nausea.    prn at prn  . tiZANidine (ZANAFLEX) 4 MG tablet Take 4 mg by mouth 3 (three) times daily as needed for muscle spasms.    prn at prn  . torsemide (DEMADEX) 10 MG tablet Take 2 tablets (20 mg total) by mouth daily as needed (leg swelling). (Patient taking differently: Take 20 mg by mouth every other day. ) 20 tablet 0 11/07/2018 at Unknown time  . traZODone (DESYREL) 50 MG tablet Take 50 mg by mouth at bedtime.   11/07/2018 at Unknown time   Scheduled:  . amLODipine  10 mg Oral Daily  . aspirin EC  81 mg Oral Daily  . atorvastatin  40 mg Oral QPM  . busPIRone  5 mg Oral BID  . carvedilol  3.125 mg Oral BID WC  . FLUoxetine  40 mg Oral QPM  . fluticasone furoate-vilanterol  1 puff Inhalation Daily   And  . umeclidinium bromide  1 puff Inhalation Daily  . furosemide  40 mg Intravenous BID  . insulin aspart  0-15 Units Subcutaneous TID WC  . insulin aspart  0-5 Units Subcutaneous QHS  . insulin detemir  20 Units Subcutaneous QHS  . pantoprazole  40 mg Oral Daily  . traZODone  50 mg Oral QHS   Infusions:  . heparin 1,400 Units/hr (11/09/18 1008)   PRN:  Anti-infectives (From admission, onward)   None      Assessment: Pharmacy consulted to initiate Heparin Drip on 77yo patient presenting to ED with chest pain.  Patient has no previous history of anticoagulant therapy. Will initiate drip immediately.  8/26 2200 HL < 0.1 2400 units IV x 1 and increase rate to 1200 units/hr 8/27 0758 HL < 0.1  8/27 1812 HL 0.24  Goal of Therapy:  Heparin level 0.3-0.7 units/ml Monitor platelets by anticoagulation protocol: Yes  Plan:  Heparin level is subtherapeutic. Will rebolus w/ heparin 1200 units IV x 1 and increase rate to 1550 units/hr and recheck HL @ 0200 and CBC check w/ am labs, will continue to monitor.  Pearla Dubonnet, PharmD Clinical Pharmacist 11/09/2018 7:01 PM

## 2018-11-09 NOTE — Progress Notes (Signed)
Mckenzie Taylor, Mckenzie Taylor ([id number]) Visit Report for 11/07/2018 Chief Complaint Document Details Patient Name: Mckenzie Taylor, Mckenzie Taylor. Date of Service: 11/07/2018 3:45 PM Medical Record Number: [id number] Patient Account Number: [id number] Date of Birth/Sex: 05-11-1940 (77 y.o. F) Treating RN: Montey Hora Primary Care Provider: Denton Lank Other Clinician: Referring Provider: Denton Lank Treating Provider/Extender: Melburn Hake, HOYT Weeks in Treatment: 19 Information Obtained from: Patient Chief Complaint Left abdominal wall ulcer Electronic Signature(s) Signed: 11/07/2018 4:21:57 PM By: Worthy Keeler PA-C Entered By: Worthy Keeler on 11/07/2018 16:21:57 Mckenzie Taylor ([id number]) -------------------------------------------------------------------------------- HPI Details Patient Name: Mckenzie Taylor. Date of Service: 11/07/2018 3:45 PM Medical Record Number: [id number] Patient Account Number: [id number] Date of Birth/Sex: 08/28/1940 (77 y.o. F) Treating RN: Montey Hora Primary Care Provider: Denton Lank Other Clinician: Referring Provider: Denton Lank Treating Provider/Extender: Melburn Hake, HOYT Weeks in Treatment: 24 History of Present Illness HPI Description: 05/01/18 on evaluation today patient presents for initial inspection in our clinic concerning an issue that she had with the two teeniest abscess over the abdominal region which occurred back in November 2019. Subsequently she was initially given [place] although more recently they mainly just been using peroxide and covering this with the bandage at home. She was given some Silvadene that has not really seem to make a big difference for her. Fortunately there's no signs of infection at this time which is excellent news. She continues to cleans this with peroxide pretty much daily. Other than this patient does have a history of hypertension, congestive heart failure, COPD, and chronic kidney disease  stage IV. No fevers, chills, nausea, or vomiting noted at this time. Patient's hemoglobin A1c was 9.3 as measured last on 02/04/18. 05/22/18 on evaluation today patient actually appears to be doing much better in regard to her down the wound compared to when I last saw her. Fortunately there's no evidence of infection at this time overall she's been tolerating the dressing changes without complication. No fevers, chills, nausea, or vomiting noted at this time. 09/19/18 telehealth Evaluation During the COVID-19 National Emergency: Verbal Consent: Obtained from patient Allergies: reviewed and the active list is current. Medication changes: patient has no current medication changes. COVID-19 Screening: 1. Have you traveled internationally or on a cruise ship in the last 14 dayso No 2. Have you had contact with someone with or under investigation for COVID-19o No 3. Have you had a fever, cough, sore throat, or experiencing shortness of breatho No On evaluation today patient actually appears to be doing quite well with regard to her abdominal ulcer. She has been taking care of this as directed since I last saw her. She has not been back into the clinic for further evaluation due to the Covid-19 Virus no emergency. Nonetheless she tells me she's not really having any drainage at this point which is good news. Overall very pleased with how things appear to be doing. 10/19/18 patient seen today for a telehealth visit due to her concerns about coming in to the clinic during the Covid-19 Virus endemic. With that being said she unfortunately at the last time I saw her thought of the wound was healed but indeed it was not and she had some skin that peeled off and revealed a purulent drainage underneath that she's been dealing with for the past couple of days. There is no sign of systemic infection but she does have some issues with discomfort currently. 11/07/18 during the last evaluation with the patient actually  had asked her to  come in for a visit here in the office in order for me to have a better look at the area that reopen on her abdominal region. Unfortunately she really is not comfortable with coming into the office and therefore we have not been able to get this scheduled. This is due to the Covid-19 Virus pandemic. Nonetheless she seems to be doing a little better based on what she's telling me today she states that the wound will attempt to scab over that will pop off and then more drainage will come out but it seems to be getting smaller little by little. Obviously this is good news. I do think that she needs to likely keep the area covered and prevent this from drying out so that hopefully it will not continue to form the small eschar that is not very stable and pops off shortly following. Electronic Signature(s) Signed: 11/08/2018 11:36:49 PM By: Worthy Keeler PA-C Entered By: Worthy Keeler on 11/08/2018 23:33:28 Mckenzie Taylor ([id number]) -------------------------------------------------------------------------------- Physical Exam Details Patient Name: Mckenzie Taylor, Mckenzie Taylor. Date of Service: 11/07/2018 3:45 PM Medical Record Number: [id number] Patient Account Number: [id number] Date of Birth/Sex: [birth year] (77 y.o. F) Treating RN: Montey Hora Primary Care Provider: Denton Lank Other Clinician: Referring Provider: Denton Lank Treating Provider/Extender: Melburn Hake, HOYT Weeks in Treatment: 27 Constitutional Obese and well-hydrated in no acute distress. Eyes conjunctiva clear no eyelid edema noted. Ears, Nose, Mouth, and Throat normal hearing noted during conversation. mucus membranes moist. Respiratory normal breathing without difficulty. Psychiatric this patient is able to make decisions and demonstrates good insight into disease process. Alert and Oriented x 3. pleasant and cooperative. Notes Patient's wound as best I could tell appears to be very small open area at  this point. There's no signs of active infection that is obvious based on what I can see although admittedly through the video feed I'm not able to get as good of a look at this as I would really like to have to be honest. Nonetheless I feel like she's doing better than the last time I saw her. Electronic Signature(s) Signed: 11/08/2018 11:36:49 PM By: Worthy Keeler PA-C Entered By: Worthy Keeler on 11/08/2018 23:34:57 Mckenzie Taylor ([id number]) -------------------------------------------------------------------------------- Physician Orders Details Patient Name: Mckenzie Taylor. Date of Service: 11/07/2018 3:45 PM Medical Record Number: [id number] Patient Account Number: [id number] Date of Birth/Sex: 22-Mar-1940 (77 y.o. F) Treating RN: Montey Hora Primary Care Provider: Denton Lank Other Clinician: Referring Provider: Denton Lank Treating Provider/Extender: Melburn Hake, HOYT Weeks in Treatment: 16 Verbal / Phone Orders: No Diagnosis Coding ICD-10 Coding Code Description L02.211 Cutaneous abscess of abdominal wall L98.492 Non-pressure chronic ulcer of skin of other sites with fat layer exposed I10 Essential (primary) hypertension I50.42 Chronic combined systolic (congestive) and diastolic (congestive) heart failure J44.9 Chronic obstructive pulmonary disease, unspecified N18.4 Chronic kidney disease, stage 4 (severe) Wound Cleansing Wound #1R Left Abdomen - midline o May Shower, gently pat wound dry prior to applying new dressing. - Clean with Dial Antibacterial soap, rinse and pat dry. Primary Wound Dressing o Gentamicin Sulfate Cream Secondary Dressing Wound #1R Left Abdomen - midline o Dry Gauze - Secure with tape Dressing Change Frequency Wound #1R Left Abdomen - midline o Change dressing twice daily. - 1-2 times a day Follow-up Appointments o Return Appointment in 1 week. Electronic Signature(s) Signed: 11/08/2018 11:36:49 PM By: Worthy Keeler  PA-C Entered By: Worthy Keeler on 11/08/2018 23:35:31 Mckenzie Taylor ([id number]) -------------------------------------------------------------------------------- Problem List Details Patient  Name: EMILEIGH, KELLETT. Date of Service: 11/07/2018 3:45 PM Medical Record Number: [id number] Patient Account Number: [id number] Date of Birth/Sex: Nov 03, 1940 (77 y.o. F) Treating RN: Montey Hora Primary Care Provider: Denton Lank Other Clinician: Referring Provider: Denton Lank Treating Provider/Extender: Sharalyn Ink in Treatment: 50 Active Problems ICD-10 Evaluated Encounter Code Description Active Date Today Diagnosis L02.211 Cutaneous abscess of abdominal wall 05/01/2018 No Yes L98.492 Non-pressure chronic ulcer of skin of other sites with fat layer 05/01/2018 No Yes exposed [place] (primary) hypertension 05/01/2018 No Yes I50.42 Chronic combined systolic (congestive) and diastolic 7/51/7001 No Yes (congestive) heart failure J44.9 Chronic obstructive pulmonary disease, unspecified 05/01/2018 No Yes N18.4 Chronic kidney disease, stage 4 (severe) 05/01/2018 No Yes Inactive Problems Resolved Problems Electronic Signature(s) Signed: 11/07/2018 4:21:51 PM By: Worthy Keeler PA-C Entered By: Worthy Keeler on 11/07/2018 16:21:51 Mckenzie Taylor ([id number]) -------------------------------------------------------------------------------- Progress Note Details Patient Name: Mckenzie Taylor. Date of Service: 11/07/2018 3:45 PM Medical Record Number: [id number] Patient Account Number: [id number] Date of Birth/Sex: [birth year] (77 y.o. F) Treating RN: Montey Hora Primary Care Provider: Denton Lank Other Clinician: Referring Provider: Denton Lank Treating Provider/Extender: Sharalyn Ink in Treatment: 41 Subjective Chief Complaint Information obtained from Patient Left abdominal wall ulcer History of Present Illness (HPI) 05/01/18 on evaluation today  patient presents for initial inspection in our clinic concerning an issue that she had with the two teeniest abscess over the abdominal region which occurred back in November 2019. Subsequently she was initially given [place] although more recently they mainly just been using peroxide and covering this with the bandage at home. She was given some Silvadene that has not really seem to make a big difference for her. Fortunately there's no signs of infection at this time which is excellent news. She continues to cleans this with peroxide pretty much daily. Other than this patient does have a history of hypertension, congestive heart failure, COPD, and chronic kidney disease stage IV. No fevers, chills, nausea, or vomiting noted at this time. Patient's hemoglobin A1c was 9.3 as measured last on 02/04/18. 05/22/18 on evaluation today patient actually appears to be doing much better in regard to her down the wound compared to when I last saw her. Fortunately there's no evidence of infection at this time overall she's been tolerating the dressing changes without complication. No fevers, chills, nausea, or vomiting noted at this time. 09/19/18 telehealth Evaluation During the COVID-19 National Emergency: Verbal Consent: Obtained from patient Allergies: reviewed and the active list is current. Medication changes: patient has no current medication changes. COVID-19 Screening: 1. Have you traveled internationally or on a cruise ship in the last 14 dayso No 2. Have you had contact with someone with or under investigation for COVID-19o No 3. Have you had a fever, cough, sore throat, or experiencing shortness of breatho No On evaluation today patient actually appears to be doing quite well with regard to her abdominal ulcer. She has been taking care of this as directed since I last saw her. She has not been back into the clinic for further evaluation due to the Covid-19 Virus no emergency. Nonetheless she  tells me she's not really having any drainage at this point which is good news. Overall very pleased with how things appear to be doing. 10/19/18 patient seen today for a telehealth visit due to her concerns about coming in to the clinic during the Covid-19 Virus endemic. With that being said she unfortunately at the last time I  saw her thought of the wound was healed but indeed it was not and she had some skin that peeled off and revealed a purulent drainage underneath that she's been dealing with for the past couple of days. There is no sign of systemic infection but she does have some issues with discomfort currently. 11/07/18 during the last evaluation with the patient actually had asked her to come in for a visit here in the office in order for me to have a better look at the area that reopen on her abdominal region. Unfortunately she really is not comfortable with coming into the office and therefore we have not been able to get this scheduled. This is due to the Covid-19 Virus pandemic. Nonetheless she seems to be doing a little better based on what she's telling me today she states that the wound will attempt to scab over that will pop off and then more drainage will come out but it seems to be getting smaller little by little. Obviously this is good news. I do think that she needs to likely keep the area covered and prevent this from drying out so that hopefully it will not continue to form the small eschar that is not very stable and pops off shortly following. Mckenzie Taylor, Mckenzie Taylor. ([id number]) Patient History Information obtained from Patient. Allergies trace metals Family History Cancer - Paternal Grandparents,Father,Child,Siblings, Heart Disease - Mother, Hypertension - Mother, Lung Disease - Mother, Stroke - Mother,Child, No family history of Hereditary Spherocytosis, Kidney Disease, Seizures, Thyroid Problems, Tuberculosis. Social History Former smoker - 2016, Marital Status -  Separated, Alcohol Use - Never, Drug Use - No History, Caffeine Use - Daily. Medical History Eyes Denies history of Cataracts, Glaucoma, Optic Neuritis Ear/Nose/Mouth/Throat Denies history of Chronic sinus problems/congestion, Middle ear problems Hematologic/Lymphatic Denies history of Anemia, Hemophilia, Human Immunodeficiency Virus, Lymphedema, Sickle Cell Disease Respiratory Patient has history of Chronic Obstructive Pulmonary Disease (COPD) Denies history of Aspiration, Asthma, Pneumothorax, Sleep Apnea, Tuberculosis Cardiovascular Patient has history of Congestive Heart Failure, Hypertension Denies history of Angina, Arrhythmia, Coronary Artery Disease, Deep Vein Thrombosis, Hypotension, Myocardial Infarction, Peripheral Arterial Disease, Peripheral Venous Disease, Phlebitis, Vasculitis Gastrointestinal Denies history of Cirrhosis , Colitis, Crohn s, Hepatitis A, Hepatitis B, Hepatitis C Endocrine Patient has history of Type II Diabetes Denies history of Type I Diabetes Genitourinary Patient has history of End Stage Renal Disease - CKD stage 4 Immunological Denies history of Lupus Erythematosus, Raynaud s, Scleroderma Integumentary (Skin) Denies history of History of Burn, History of pressure wounds Musculoskeletal Denies history of Gout, Rheumatoid Arthritis, Osteoarthritis, Osteomyelitis Neurologic Denies history of Dementia, Neuropathy, Quadriplegia, Paraplegia, Seizure Disorder Oncologic Denies history of Received Chemotherapy, Received Radiation Psychiatric Denies history of Anorexia/bulimia, Confinement Anxiety Medical And Surgical History Notes Respiratory home O2 at 3L Genitourinary polycystic kidneys Review of Systems (ROS) Constitutional Symptoms (General Health) Denies complaints or symptoms of Fatigue, Fever, Chills, Marked Weight Change. Respiratory Denies complaints or symptoms of Chronic or frequent coughs, Shortness of Breath. Mckenzie Taylor, Mckenzie Taylor.  ([id number]) Cardiovascular Denies complaints or symptoms of Chest pain, LE edema. Psychiatric Denies complaints or symptoms of Anxiety, Claustrophobia. Objective Constitutional Obese and well-hydrated in no acute distress. Eyes conjunctiva clear no eyelid edema noted. Ears, Nose, Mouth, and Throat normal hearing noted during conversation. mucus membranes moist. Respiratory normal breathing without difficulty. Psychiatric this patient is able to make decisions and demonstrates good insight into disease process. Alert and Oriented x 3. pleasant and cooperative. General Notes: Patient's wound as best I could tell appears  to be very small open area at this point. There's no signs of active infection that is obvious based on what I can see although admittedly through the video feed I'm not able to get as good of a look at this as I would really like to have to be honest. Nonetheless I feel like she's doing better than the last time I saw her. Assessment Active Problems ICD-10 Cutaneous abscess of abdominal wall Non-pressure chronic ulcer of skin of other sites with fat layer exposed Essential (primary) hypertension Chronic combined systolic (congestive) and diastolic (congestive) heart failure Chronic obstructive pulmonary disease, unspecified Chronic kidney disease, stage 4 (severe) Plan Mckenzie Taylor, Mckenzie Taylor ([id number]) Wound Cleansing: Wound #1R Left Abdomen - midline: May Shower, gently pat wound dry prior to applying new dressing. - Clean with Dial Antibacterial soap, rinse and pat dry. Primary Wound Dressing: Gentamicin Sulfate Cream Secondary Dressing: Wound #1R Left Abdomen - midline: Dry Gauze - Secure with tape Dressing Change Frequency: Wound #1R Left Abdomen - midline: Change dressing twice daily. - 1-2 times a day Follow-up Appointments: Return Appointment in 1 week. My suggestion at this point is gonna be that we go ahead and continue with the current wound care  orders. I believe that she is doing okay at this time and I'm hopeful that she will continue to show signs of improvement. Again we been using the gentamicin cream which I think is definitely appropriate. If anything changes or worsens should let me know otherwise she's gonna try to keep this from drying out and scatting over with the eschar and we will see were things stand at follow-up in two weeks for a repeat telehealth check and she still is not comfortable with coming into the office. Please see above for specific wound care orders. We will see patient for re-evaluation in 2 week(s) here in the clinic. If anything worsens or changes patient will contact our office for additional recommendations. Electronic Signature(s) Signed: 11/08/2018 11:36:49 PM By: Worthy Keeler PA-C Entered By: Worthy Keeler on 11/08/2018 23:36:14 Mckenzie Taylor ([id number]) -------------------------------------------------------------------------------- ROS/PFSH Details Patient Name: Mckenzie Taylor. Date of Service: 11/07/2018 3:45 PM Medical Record Number: [id number] Patient Account Number: [id number] Date of Birth/Sex: 12/31/1940 (77 y.o. F) Treating RN: Montey Hora Primary Care Provider: Denton Lank Other Clinician: Referring Provider: Denton Lank Treating Provider/Extender: Melburn Hake, HOYT Weeks in Treatment: 80 Information Obtained From Patient Constitutional Symptoms (General Health) Complaints and Symptoms: Negative for: Fatigue; Fever; Chills; Marked Weight Change Respiratory Complaints and Symptoms: Negative for: Chronic or frequent coughs; Shortness of Breath Medical History: Positive for: Chronic Obstructive Pulmonary Disease (COPD) Negative for: Aspiration; Asthma; Pneumothorax; Sleep Apnea; Tuberculosis Past Medical History Notes: home O2 at 3L Cardiovascular Complaints and Symptoms: Negative for: Chest pain; LE edema Medical History: Positive for: Congestive Heart  Failure; Hypertension Negative for: Angina; Arrhythmia; Coronary Artery Disease; Deep Vein Thrombosis; Hypotension; Myocardial Infarction; Peripheral Arterial Disease; Peripheral Venous Disease; Phlebitis; Vasculitis Psychiatric Complaints and Symptoms: Negative for: Anxiety; Claustrophobia Medical History: Negative for: Anorexia/bulimia; Confinement Anxiety Eyes Medical History: Negative for: Cataracts; Glaucoma; Optic Neuritis Ear/Nose/Mouth/Throat Medical History: Negative for: Chronic sinus problems/congestion; Middle ear problems Hematologic/Lymphatic Mckenzie Taylor, Mckenzie Taylor ([id number]) Medical History: Negative for: Anemia; Hemophilia; Human Immunodeficiency Virus; Lymphedema; Sickle Cell Disease Gastrointestinal Medical History: Negative for: Cirrhosis ; Colitis; Crohnos; Hepatitis A; Hepatitis B; Hepatitis C Endocrine Medical History: Positive for: Type II Diabetes Negative for: Type I Diabetes Genitourinary Medical History: Positive for: End Stage Renal Disease - CKD stage 4 Past  Medical History Notes: polycystic kidneys Immunological Medical History: Negative for: Lupus Erythematosus; Raynaudos; Scleroderma Integumentary (Skin) Medical History: Negative for: History of Burn; History of pressure wounds Musculoskeletal Medical History: Negative for: Gout; Rheumatoid Arthritis; Osteoarthritis; Osteomyelitis Neurologic Medical History: Negative for: Dementia; Neuropathy; Quadriplegia; Paraplegia; Seizure Disorder Oncologic Medical History: Negative for: Received Chemotherapy; Received Radiation Immunizations Pneumococcal Vaccine: Received Pneumococcal Vaccination: Yes Implantable Devices No devices added Family and Social History Cancer: Yes - Paternal Grandparents,Father,Child,Siblings; Heart Disease: Yes - Mother; Hereditary Spherocytosis: No; Hypertension: Yes - Mother; Kidney Disease: No; Lung Disease: Yes - Mother; Seizures: No; Stroke: Yes -  Mother,Child; Thyroid Problems: No; Tuberculosis: No; Former smoker - 2016; Marital Status - Separated; Alcohol Use: Never; Drug Use: Mckenzie Taylor, Mckenzie Taylor. ([id number]) No History; Caffeine Use: Daily; Financial Concerns: No; Food, Clothing or Shelter Needs: No; Support System Lacking: No; Transportation Concerns: No Physician Affirmation I have reviewed and agree with the above information. Electronic Signature(s) Signed: 11/07/2018 4:25:23 PM By: Montey Hora Signed: 11/08/2018 11:36:49 PM By: Worthy Keeler PA-C Entered By: Worthy Keeler on 11/07/2018 16:21:45 Mckenzie Taylor ([id number]) -------------------------------------------------------------------------------- SuperBill Details Patient Name: Mckenzie Taylor. Date of Service: 11/07/2018 Medical Record Number: [id number] Patient Account Number: [id number] Date of Birth/Sex: Aug 02, 1940 (77 y.o. F) Treating RN: Montey Hora Primary Care Provider: Denton Lank Other Clinician: Referring Provider: Denton Lank Treating Provider/Extender: Melburn Hake, HOYT Weeks in Treatment: 27 Diagnosis Coding ICD-10 Codes Code Description L02.211 Cutaneous abscess of abdominal wall L98.492 Non-pressure chronic ulcer of skin of other sites with fat layer exposed I10 Essential (primary) hypertension I50.42 Chronic combined systolic (congestive) and diastolic (congestive) heart failure J44.9 Chronic obstructive pulmonary disease, unspecified N18.4 Chronic kidney disease, stage 4 (severe) Physician Procedures CPT4 Code Description: [id number] - WC PHYS LEVEL 3 - EST PT ICD-10 Diagnosis Description L02.211 Cutaneous abscess of abdominal wall L98.492 Non-pressure chronic ulcer of skin of other sites with fat la I10 Essential (primary) hypertension I50.42  Chronic combined systolic (congestive) and diastolic (congest Modifier: yer exposed ive) heart failur Quantity: 1 e Electronic Signature(s) Signed: 11/08/2018 11:36:49 PM By: Worthy Keeler PA-C Entered By: Worthy Keeler on 11/08/2018 00:02:12

## 2018-11-09 NOTE — Consult Note (Addendum)
ANTICOAGULATION CONSULT NOTE - Initial Consult  Pharmacy Consult for Heparin Drip Indication: chest pain/ACS  Allergies  Allergen Reactions  . Other Rash    Pt reports allergy to metals.    Patient Measurements: Height: 5\' 5"  (165.1 cm) Weight: 211 lb 11.2 oz (96 kg) IBW/kg (Calculated) : 57 Heparin Dosing Weight: 78.7 kg  Vital Signs: Temp: 98.2 F (36.8 C) (08/27 0820) Temp Source: Oral (08/27 0820) BP: 123/61 (08/27 0820) Pulse Rate: 91 (08/27 0820)  Labs: Recent Labs    11/08/18 1010  11/08/18 1341  11/08/18 1755 11/08/18 2200 11/09/18 0015 11/09/18 0756  HGB 9.5*  --   --   --   --   --  9.3*  --   HCT 32.0*  --   --   --   --   --  31.1*  --   PLT 404*  --   --   --   --   --  366  --   APTT  --   --  29  --   --   --   --   --   LABPROT  --   --  12.8  --   --   --   --   --   INR  --   --  1.0  --   --   --   --   --   HEPARINUNFRC  --   --   --   --   --  <0.10*  --  <0.10*  CREATININE 2.17*  --   --   --   --   --  2.30*  --   TROPONINIHS 130*   < >  --    < > 584* 556* 548*  --    < > = values in this interval not displayed.    Estimated Creatinine Clearance: 23.8 mL/min (A) (by C-G formula based on SCr of 2.3 mg/dL (H)).   Medical History: Past Medical History:  Diagnosis Date  . Anemia of chronic disease    Baseline hgb 8.0-8.9  . Autosomal recessive polycystic kidneys   . Avascular necrosis of hip (HCC)    bilateral  . CAD (coronary artery disease) 2004   2004 LHC with mild and nonobstructive CAD: 20% mid LAD stenosis  . Chronic combined systolic and diastolic heart failure (HCC)    HFrEF, EF 45% last echo, LVH, diastolic dysfunction 0923  . Chronic kidney disease    PCKD, CKD, adrenal adenoma, cyst  . COPD (chronic obstructive pulmonary disease) ([place])    3L home oxygen  . Diabetes type 2, controlled ([place])   . Former heavy tobacco smoker    1 pk / day. Estimates quit ~ 2015  . GERD (gastroesophageal reflux disease)   . HTN  (hypertension)   . Hyperlipidemia with target LDL less than 70   . Hypothyroidism    subclinical. low TSH, normal thyroid panel. biopsy 2010  . Vitamin B12 deficiency     Medications:  Medications Prior to Admission  Medication Sig Dispense Refill Last Dose  . albuterol (VENTOLIN HFA) 108 (90 BASE) MCG/ACT inhaler Inhale 2 puffs into the lungs every 4 (four) hours as needed.    prn at prn  . amLODipine (NORVASC) 10 MG tablet Take 10 mg by mouth daily.     11/07/2018 at Unknown time  . atorvastatin (LIPITOR) 40 MG tablet Take 40 mg by mouth every evening.    11/07/2018 at Unknown time  .  busPIRone (BUSPAR) 5 MG tablet Take 5 mg by mouth 2 (two) times daily.   11/07/2018 at Unknown time  . Cholecalciferol (VITAMIN D3) 5000 units CAPS Take 1 capsule by mouth daily.   11/07/2018 at Unknown time  . Cyanocobalamin 1500 MCG TBDP Take 3 tablets by mouth daily.   11/07/2018 at Unknown time  . FLUoxetine (PROZAC) 40 MG capsule Take 40 mg by mouth every evening.    11/07/2018 at Unknown time  . Fluticasone-Umeclidin-Vilant (TRELEGY ELLIPTA) 100-62.5-25 MCG/INH AEPB Inhale 1 puff into the lungs daily.   11/07/2018 at Unknown time  . HUMALOG KWIKPEN 100 UNIT/ML KwikPen Inject 0-22 Units into the skin 3 (three) times daily with meals. Sliding scale   11/07/2018 at Unknown time  . ipratropium-albuterol (DUONEB) 0.5-2.5 (3) MG/3ML SOLN Take 3 mLs by nebulization 4 (four) times daily. 360 mL 0 11/07/2018 at Unknown time  . LEVEMIR FLEXTOUCH 100 UNIT/ML Pen Inject 43 Units into the skin at bedtime.    11/07/2018 at Unknown time  . metolazone (ZAROXOLYN) 2.5 MG tablet Take 2.5 mg by mouth daily.   11/07/2018 at Unknown time  . omeprazole (PRILOSEC) 20 MG capsule Take 20 mg by mouth daily.   11/07/2018 at Unknown time  . oxyCODONE-acetaminophen (PERCOCET) 10-325 MG tablet Take 1 tablet by mouth 3 (three) times daily as needed for pain.    prn at prn  . promethazine (PHENERGAN) 25 MG tablet Take 25 mg by mouth every 6 (six)  hours as needed for nausea.    prn at prn  . tiZANidine (ZANAFLEX) 4 MG tablet Take 4 mg by mouth 3 (three) times daily as needed for muscle spasms.    prn at prn  . torsemide (DEMADEX) 10 MG tablet Take 2 tablets (20 mg total) by mouth daily as needed (leg swelling). (Patient taking differently: Take 20 mg by mouth every other day. ) 20 tablet 0 11/07/2018 at Unknown time  . traZODone (DESYREL) 50 MG tablet Take 50 mg by mouth at bedtime.   11/07/2018 at Unknown time   Scheduled:  . amLODipine  10 mg Oral Daily  . aspirin  324 mg Oral NOW   Or  . aspirin  300 mg Rectal NOW  . aspirin EC  81 mg Oral Daily  . atorvastatin  40 mg Oral QPM  . busPIRone  5 mg Oral BID  . carvedilol  3.125 mg Oral BID WC  . FLUoxetine  40 mg Oral QPM  . fluticasone furoate-vilanterol  1 puff Inhalation Daily   And  . umeclidinium bromide  1 puff Inhalation Daily  . furosemide  40 mg Intravenous BID  . insulin aspart  0-15 Units Subcutaneous TID WC  . insulin aspart  0-5 Units Subcutaneous QHS  . insulin detemir  20 Units Subcutaneous QHS  . pantoprazole  40 mg Oral Daily  . traZODone  50 mg Oral QHS   Infusions:  . heparin 1,200 Units/hr (11/09/18 0533)   PRN:  Anti-infectives (From admission, onward)   None      Assessment: Pharmacy consulted to initiate Heparin Drip on 77yo patient presenting to ED with chest pain.  Patient has no previous history of anticoagulant therapy. Will initiate drip immediately.  8/26 2200 HL < 0.1 2400 units IV x 1 and increase rate to 1200 units/hr 8/27 0758 HL < 0.1   Goal of Therapy:  Heparin level 0.3-0.7 units/ml Monitor platelets by anticoagulation protocol: Yes   Plan:  Heparin level is subtherapeutic. Will rebolus w/ heparin  2400 units IV x 1 and increase rate to 1400 units/hr and recheck HL @ 1800 w/ CBC check w/ am labs, will continue to monitor.  Eleonore Chiquito, PharmD, BCPS Clinical Pharmacist 11/09/2018,9:39 AM

## 2018-11-09 NOTE — Progress Notes (Signed)
[place] [facility]                                                                                                                                                                                  Patient Demographics   Mckenzie Taylor, is a 78 y.o. female, DOB - [birth year], YPP:[id number]  Admit date - 11/08/2018   Admitting Physician Sela Hua, MD  Outpatient Primary MD for the patient is Denton Lank, MD   LOS - 1  Subjective: Patient continues to complain of shortness of breath states that she is feeling little better    Review of Systems:   CONSTITUTIONAL: No documented fever. No fatigue, weakness. No weight gain, no weight loss.  EYES: No blurry or double vision.  ENT: No tinnitus. No postnasal drip. No redness of the oropharynx.  RESPIRATORY: No cough, no wheeze, no hemoptysis.  Positive dyspnea.  CARDIOVASCULAR: No chest pain. No orthopnea. No palpitations. No syncope.  GASTROINTESTINAL: No nausea, no vomiting or diarrhea. No abdominal pain. No melena or hematochezia.  GENITOURINARY: No dysuria or hematuria.  ENDOCRINE: No polyuria or nocturia. No heat or cold intolerance.  HEMATOLOGY: No anemia. No bruising. No bleeding.  INTEGUMENTARY: No rashes. No lesions.  MUSCULOSKELETAL: No arthritis. No swelling. No gout.  NEUROLOGIC: No numbness, tingling, or ataxia. No seizure-type activity.  PSYCHIATRIC: No anxiety. No insomnia. No ADD.    Vitals:   Vitals:   11/08/18 1616 11/08/18 1932 11/09/18 0538 11/09/18 0820  BP:  (!) 147/82 122/73 123/61  Pulse:  99 94 91  Resp:  20  18  Temp:  98 F (36.7 C) 98.5 F (36.9 C) 98.2 F (36.8 C)  TempSrc:  Oral Oral Oral  SpO2:  98% 97% 95%  Weight: 96 kg     Height: 5\' 5"  (1.651 m)       Wt Readings from Last 3 Encounters:  11/08/18 96 kg  02/10/18 104.2 kg  01/10/18 106.9 kg     Intake/Output Summary (Last 24 hours) at 11/09/2018 1545 Last data filed at 11/09/2018 1100 Gross per 24  hour  Intake 169.83 ml  Output 2250 ml  Net -2080.17 ml    Physical Exam:   GENERAL: Pleasant-appearing in no apparent distress.  HEAD, EYES, EARS, NOSE AND THROAT: Atraumatic, normocephalic. Extraocular muscles are intact. Pupils equal and reactive to light. Sclerae anicteric. No conjunctival injection. No oro-pharyngeal erythema.  NECK: Supple. There is no jugular venous distention. No bruits, no lymphadenopathy, no thyromegaly.  HEART: Regular rate and rhythm,. No murmurs, no rubs, no clicks.  LUNGS: Crackles at the bases bilateral wheezing ABDOMEN: Soft, flat, nontender, nondistended. Has good bowel sounds. No  hepatosplenomegaly appreciated.  EXTREMITIES: No evidence of any cyanosis, clubbing, or peripheral edema.  +2 pedal and radial pulses bilaterally.  NEUROLOGIC: The patient is alert, awake, and oriented x3 with no focal motor or sensory deficits appreciated bilaterally.  SKIN: Moist and warm with no rashes appreciated.  Psych: Not anxious, depressed LN: No inguinal LN enlargement    Antibiotics   Anti-infectives (From admission, onward)   None      Medications   Scheduled Meds: . amLODipine  10 mg Oral Daily  . aspirin EC  81 mg Oral Daily  . atorvastatin  40 mg Oral QPM  . busPIRone  5 mg Oral BID  . carvedilol  3.125 mg Oral BID WC  . FLUoxetine  40 mg Oral QPM  . fluticasone furoate-vilanterol  1 puff Inhalation Daily   And  . umeclidinium bromide  1 puff Inhalation Daily  . furosemide  40 mg Intravenous BID  . insulin aspart  0-15 Units Subcutaneous TID WC  . insulin aspart  0-5 Units Subcutaneous QHS  . insulin detemir  20 Units Subcutaneous QHS  . pantoprazole  40 mg Oral Daily  . traZODone  50 mg Oral QHS   Continuous Infusions: . heparin 1,400 Units/hr (11/09/18 1008)   PRN Meds:.acetaminophen, albuterol, ipratropium-albuterol, morphine injection, nitroGLYCERIN, ondansetron (ZOFRAN) IV, oxyCODONE-acetaminophen **AND** oxyCODONE, promethazine,  tiZANidine   Data Review:   Micro Results No results found for this or any previous visit (from the past 240 hour(s)).  Radiology Reports Dg Chest 2 View  Result Date: 11/08/2018 CLINICAL DATA:  78 year old female with midsternal chest pain since last night. Pain radiating to the left shoulder. EXAM: CHEST - 2 VIEW COMPARISON:  Chest radiographs 02/10/2018 and earlier. FINDINGS: Stable lung volumes. Stable cardiac size at the upper limits of normal. Chronic linear scarring at both lung bases, and the costophrenic angles. Stable ventilation. No pneumothorax, pulmonary edema or acute pulmonary opacity. Visualized tracheal air column is within normal limits. No acute osseous abnormality identified. Negative visible bowel gas pattern. IMPRESSION: Chronic lung base scarring.  No acute cardiopulmonary abnormality. Electronically Signed   By: Genevie Ann M.D.   On: 11/08/2018 11:00     CBC Recent Labs  Lab 11/08/18 1010 11/09/18 0015  WBC 11.2* 10.3  HGB 9.5* 9.3*  HCT 32.0* 31.1*  PLT 404* 366  MCV 89.6 88.9  MCH 26.6 26.6  MCHC 29.7* 29.9*  RDW 17.5* 17.3*    Chemistries  Recent Labs  Lab 11/08/18 1010 11/08/18 2200 11/09/18 0015  NA 140  --  140  K 3.7  --  3.1*  CL 101  --  100  CO2 29  --  30  GLUCOSE 152*  --  147*  BUN 30*  --  33*  CREATININE 2.17*  --  2.30*  CALCIUM 8.8*  --  8.5*  MG  --  1.3*  --   AST  --  18  --   ALT  --  19  --   ALKPHOS  --  64  --   BILITOT  --  0.2*  --    ------------------------------------------------------------------------------------------------------------------ estimated creatinine clearance is 23.8 mL/min (A) (by C-G formula based on SCr of 2.3 mg/dL (H)). ------------------------------------------------------------------------------------------------------------------ Recent Labs    11/08/18 1615  HGBA1C 8.3*    ------------------------------------------------------------------------------------------------------------------ Recent Labs    11/09/18 0015  CHOL 192  HDL 31*  LDLCALC UNABLE TO CALCULATE IF TRIGLYCERIDE OVER 400 mg/dL  TRIG 465*  CHOLHDL 6.2  LDLDIRECT 99.3*   ------------------------------------------------------------------------------------------------------------------  Recent Labs    11/08/18 2200  TSH 1.317   ------------------------------------------------------------------------------------------------------------------ Recent Labs    11/09/18 0015  FERRITIN 27  TIBC 356  IRON 37    Coagulation profile Recent Labs  Lab 11/08/18 1341  INR 1.0    No results for input(s): DDIMER in the last 72 hours.  Cardiac Enzymes No results for input(s): CKMB, TROPONINI, MYOGLOBIN in the last 168 hours.  Invalid input(s): CK ------------------------------------------------------------------------------------------------------------------ Invalid input(s): POCBNP    Assessment & Plan   Chest pain-with atypical features Patient also has chronic kidney disease Cardiology would like medical management Patient also states that she would not like to have a cath or stress test We will review echocardiogram of the heart Continue aspirin and Coreg   Acute on chronic congestive heart failure with midrange EF- a continue IV Lasix   AKI in CKD III-continue to monitor especially with IV Lasix   Hypertension-BP normal in the ED -Continue home Norvasc  Type 2 diabetes- blood sugar elevated in the ED -Levemir 20 units qhs and moderate SSI  Chronic respiratory failure due to COPD-on 3 L O2 at baseline.    Will treat with nebs steroids due to exasperation  Depression/anxiety-stable -Continue home Buspar, Prozac, trazodone  Chronic normocytic anemia-hemoglobin at baseline -Monitor     Code Status Orders  (From admission, onward)         Start      Ordered   11/08/18 1558  Do not attempt resuscitation (DNR)  Continuous    Question Answer Comment  In the event of cardiac or respiratory ARREST Do not call a "code blue"   In the event of cardiac or respiratory ARREST Do not perform Intubation, CPR, defibrillation or ACLS   In the event of cardiac or respiratory ARREST Use medication by any route, position, wound care, and other measures to relive pain and suffering. May use oxygen, suction and manual treatment of airway obstruction as needed for comfort.      11/08/18 1557        Code Status History    Date Active Date Inactive Code Status Order ID Comments User Context   02/10/2018 2149 02/12/2018 2141 Full Code [id number]  Demetrios Loll, MD Inpatient   01/04/2018 1347 01/10/2018 1449 Full Code [id number]  Hillary Bow, MD ED   01/07/2017 1624 01/08/2017 1240 DNR [id number]  Asencion Gowda, NP Inpatient   01/06/2017 1838 01/07/2017 1624 Partial Code [id number]  Vernell Morgans, RN Inpatient   01/06/2017 1548 01/06/2017 1838 Full Code [id number]  Bettey Costa, MD Inpatient   01/06/2017 1233 01/06/2017 1548 DNR [id number]  Bettey Costa, MD Inpatient   11/26/2016 1003 11/27/2016 1545 DNR [id number]  Hillary Bow, MD Inpatient   11/25/2016 2126 11/26/2016 1003 Full Code [id number]  Bettey Costa, MD Inpatient   10/25/2016 1614 10/29/2016 1725 Full Code [id number]  Henreitta Leber, MD ED   08/14/2016 2332 08/19/2016 1657 Full Code [id number]  Lance Coon, MD Inpatient   05/05/2016 0747 05/07/2016 1804 Full Code [id number]  Flora Lipps, MD ED   03/24/2016 1416 03/28/2016 1424 Full Code [id number]  Awilda Bill, NP ED   Advance Care Planning Activity           Consults cards  DVT Prophylaxis  heaprin  Lab Results  Component Value Date   PLT 366 11/09/2018     Time Spent in minutes  33min  Greater than 50% of time spent in care coordination and counseling patient regarding the condition and plan of care.  Dustin Flock M.D on 11/09/2018  at 3:45 PM  Between 7am to 6pm - Pager - [phone]  After 6pm go to [email or web address] - Proofreader  [employer]   Office  [phone]

## 2018-11-09 NOTE — Consult Note (Signed)
ANTICOAGULATION CONSULT NOTE - Initial Consult  Pharmacy Consult for Heparin Drip Indication: chest pain/ACS  Allergies  Allergen Reactions  . Other Rash    Pt reports allergy to metals.    Patient Measurements: Height: 5\' 5"  (165.1 cm) Weight: 211 lb 11.2 oz (96 kg) IBW/kg (Calculated) : 57 Heparin Dosing Weight: 78.7 kg  Vital Signs: Temp: 98 F (36.7 C) (08/26 1932) Temp Source: Oral (08/26 1932) BP: 147/82 (08/26 1932) Pulse Rate: 99 (08/26 1932)  Labs: Recent Labs    11/08/18 1010  11/08/18 1341 11/08/18 1615 11/08/18 1755 11/08/18 2200  HGB 9.5*  --   --   --   --   --   HCT 32.0*  --   --   --   --   --   PLT 404*  --   --   --   --   --   APTT  --   --  29  --   --   --   LABPROT  --   --  12.8  --   --   --   INR  --   --  1.0  --   --   --   HEPARINUNFRC  --   --   --   --   --  <0.10*  CREATININE 2.17*  --   --   --   --   --   TROPONINIHS 130*   < >  --  533* 584* 556*   < > = values in this interval not displayed.    Estimated Creatinine Clearance: 25.3 mL/min (A) (by C-G formula based on SCr of 2.17 mg/dL (H)).   Medical History: Past Medical History:  Diagnosis Date  . Anemia of chronic disease    Baseline hgb 8.0-8.9  . Autosomal recessive polycystic kidneys   . Avascular necrosis of hip (HCC)    bilateral  . CAD (coronary artery disease) 2004   2004 LHC with mild and nonobstructive CAD: 20% mid LAD stenosis  . Chronic combined systolic and diastolic heart failure (HCC)    HFrEF, EF 45% last echo, LVH, diastolic dysfunction 6644  . Chronic kidney disease    PCKD, CKD, adrenal adenoma, cyst  . COPD (chronic obstructive pulmonary disease) ([place])    3L home oxygen  . Diabetes type 2, controlled ([place])   . Former heavy tobacco smoker    1 pk / day. Estimates quit ~ 2015  . GERD (gastroesophageal reflux disease)   . HTN (hypertension)   . Hyperlipidemia with target LDL less than 70   . Hypothyroidism    subclinical. low TSH, normal  thyroid panel. biopsy 2010  . Vitamin B12 deficiency     Medications:  Medications Prior to Admission  Medication Sig Dispense Refill Last Dose  . albuterol (VENTOLIN HFA) 108 (90 BASE) MCG/ACT inhaler Inhale 2 puffs into the lungs every 4 (four) hours as needed.    prn at prn  . amLODipine (NORVASC) 10 MG tablet Take 10 mg by mouth daily.     11/07/2018 at Unknown time  . atorvastatin (LIPITOR) 40 MG tablet Take 40 mg by mouth every evening.    11/07/2018 at Unknown time  . busPIRone (BUSPAR) 5 MG tablet Take 5 mg by mouth 2 (two) times daily.   11/07/2018 at Unknown time  . Cholecalciferol (VITAMIN D3) 5000 units CAPS Take 1 capsule by mouth daily.   11/07/2018 at Unknown time  . Cyanocobalamin 1500 MCG TBDP Take 3  tablets by mouth daily.   11/07/2018 at Unknown time  . FLUoxetine (PROZAC) 40 MG capsule Take 40 mg by mouth every evening.    11/07/2018 at Unknown time  . Fluticasone-Umeclidin-Vilant (TRELEGY ELLIPTA) 100-62.5-25 MCG/INH AEPB Inhale 1 puff into the lungs daily.   11/07/2018 at Unknown time  . HUMALOG KWIKPEN 100 UNIT/ML KwikPen Inject 0-22 Units into the skin 3 (three) times daily with meals. Sliding scale   11/07/2018 at Unknown time  . ipratropium-albuterol (DUONEB) 0.5-2.5 (3) MG/3ML SOLN Take 3 mLs by nebulization 4 (four) times daily. 360 mL 0 11/07/2018 at Unknown time  . LEVEMIR FLEXTOUCH 100 UNIT/ML Pen Inject 43 Units into the skin at bedtime.    11/07/2018 at Unknown time  . metolazone (ZAROXOLYN) 2.5 MG tablet Take 2.5 mg by mouth daily.   11/07/2018 at Unknown time  . omeprazole (PRILOSEC) 20 MG capsule Take 20 mg by mouth daily.   11/07/2018 at Unknown time  . oxyCODONE-acetaminophen (PERCOCET) 10-325 MG tablet Take 1 tablet by mouth 3 (three) times daily as needed for pain.    prn at prn  . promethazine (PHENERGAN) 25 MG tablet Take 25 mg by mouth every 6 (six) hours as needed for nausea.    prn at prn  . tiZANidine (ZANAFLEX) 4 MG tablet Take 4 mg by mouth 3 (three) times  daily as needed for muscle spasms.    prn at prn  . torsemide (DEMADEX) 10 MG tablet Take 2 tablets (20 mg total) by mouth daily as needed (leg swelling). (Patient taking differently: Take 20 mg by mouth every other day. ) 20 tablet 0 11/07/2018 at Unknown time  . traZODone (DESYREL) 50 MG tablet Take 50 mg by mouth at bedtime.   11/07/2018 at Unknown time   Scheduled:  . amLODipine  10 mg Oral Daily  . aspirin  324 mg Oral NOW   Or  . aspirin  300 mg Rectal NOW  . aspirin EC  81 mg Oral Daily  . atorvastatin  40 mg Oral QPM  . busPIRone  5 mg Oral BID  . carvedilol  3.125 mg Oral BID WC  . FLUoxetine  40 mg Oral QPM  . fluticasone furoate-vilanterol  1 puff Inhalation Daily   And  . umeclidinium bromide  1 puff Inhalation Daily  . furosemide  40 mg Intravenous BID  . heparin  2,400 Units Intravenous Once  . insulin aspart  0-15 Units Subcutaneous TID WC  . insulin aspart  0-5 Units Subcutaneous QHS  . insulin detemir  20 Units Subcutaneous QHS  . pantoprazole  40 mg Oral Daily  . traZODone  50 mg Oral QHS   Infusions:  . heparin 1,050 Units/hr (11/08/18 1348)   PRN:  Anti-infectives (From admission, onward)   None      Assessment: Pharmacy consulted to initiate Heparin Drip on 78yo patient presenting to ED with chest pain.  Patient has no previous history of anticoagulant therapy. Will initiate drip immediately.  Goal of Therapy:  Heparin level 0.3-0.7 units/ml Monitor platelets by anticoagulation protocol: Yes   Plan:  08/26 @ 2200 HL < 0.10 subtherapeutic. Per RN drip was only stopped for a few minutes to draw labs. Will rebolus w/ heparin 2400 units IV x 1 and increase rate to 1200 units/hr and recheck HL @ 0800 w/ CBC check w/ am labs, will continue to monitor.  Tobie Lords, PharmD, BCPS Clinical Pharmacist 11/09/2018,12:08 AM

## 2018-11-09 NOTE — Plan of Care (Signed)

## 2018-11-09 NOTE — Progress Notes (Signed)
Patient had a some shortness of breath after getting back to bed from using the bedside commode and stated she feels like she needs a breathing treatment. Administer duoneb treatment per PRN order. Breathing treatment effective. Will continue to monitor patient to the end of shift.

## 2018-11-10 DIAGNOSIS — J432 Centrilobular emphysema: Secondary | ICD-10-CM

## 2018-11-10 DIAGNOSIS — I248 Other forms of acute ischemic heart disease: Secondary | ICD-10-CM

## 2018-11-10 DIAGNOSIS — I5043 Acute on chronic combined systolic (congestive) and diastolic (congestive) heart failure: Secondary | ICD-10-CM

## 2018-11-10 LAB — CBC
HCT: 31.7 % — ABNORMAL LOW (ref 36.0–46.0)
Hemoglobin: 9.4 g/dL — ABNORMAL LOW (ref 12.0–15.0)
MCH: 26.2 pg (ref 26.0–34.0)
MCHC: 29.7 g/dL — ABNORMAL LOW (ref 30.0–36.0)
MCV: 88.3 fL (ref 80.0–100.0)
Platelets: 377 10*3/uL (ref 150–400)
RBC: 3.59 MIL/uL — ABNORMAL LOW (ref 3.87–5.11)
RDW: 17.3 % — ABNORMAL HIGH (ref 11.5–15.5)
WBC: 10.5 10*3/uL (ref 4.0–10.5)
nRBC: 0.2 % (ref 0.0–0.2)

## 2018-11-10 LAB — GLUCOSE, CAPILLARY
Glucose-Capillary: 180 mg/dL — ABNORMAL HIGH (ref 70–99)
Glucose-Capillary: 157 mg/dL — ABNORMAL HIGH (ref 70–99)
Glucose-Capillary: 143 mg/dL — ABNORMAL HIGH (ref 70–99)
Glucose-Capillary: 167 mg/dL — ABNORMAL HIGH (ref 70–99)

## 2018-11-10 LAB — BASIC METABOLIC PANEL
Anion gap: 9 (ref 5–15)
BUN: 33 mg/dL — ABNORMAL HIGH (ref 8–23)
CO2: 31 mmol/L (ref 22–32)
Calcium: 8.4 mg/dL — ABNORMAL LOW (ref 8.9–10.3)
Chloride: 100 mmol/L (ref 98–111)
Creatinine, Ser: 2.45 mg/dL — ABNORMAL HIGH (ref 0.44–1.00)
GFR calc Af Amer: 21 mL/min — ABNORMAL LOW (ref >60)
GFR calc non Af Amer: 19 mL/min — ABNORMAL LOW (ref >60)
Glucose, Bld: 137 mg/dL — ABNORMAL HIGH (ref 70–99)
Potassium: 3.5 mmol/L (ref 3.5–5.1)
Sodium: 140 mmol/L (ref 135–145)

## 2018-11-10 LAB — HEPARIN LEVEL (UNFRACTIONATED): Heparin Unfractionated: 0.27 IU/mL — ABNORMAL LOW (ref 0.30–0.70)

## 2018-11-10 MED ORDER — HEPARIN BOLUS VIA INFUSION
1200.0000 [IU] | Freq: Once | INTRAVENOUS | Status: AC
  Administered 2018-11-10: 1200 [IU] via INTRAVENOUS
  Filled 2018-11-10: qty 1200

## 2018-11-10 MED ORDER — GUAIFENESIN ER 600 MG PO TB12
600.0000 mg | ORAL_TABLET | Freq: Two times a day (BID) | ORAL | Status: DC
  Administered 2018-11-10 – 2018-11-11 (×3): 600 mg via ORAL
  Filled 2018-11-10 (×3): qty 1

## 2018-11-10 MED ORDER — ALBUTEROL SULFATE HFA 108 (90 BASE) MCG/ACT IN AERS: 2.0000 | INHALATION_SPRAY | Freq: Four times a day (QID) | RESPIRATORY_TRACT | Status: DC

## 2018-11-10 MED ORDER — TORSEMIDE 20 MG PO TABS
20.0000 mg | ORAL_TABLET | Freq: Every day | ORAL | Status: DC
  Administered 2018-11-11: 20 mg via ORAL
  Filled 2018-11-10: qty 1

## 2018-11-10 MED ORDER — POTASSIUM CHLORIDE CRYS ER 20 MEQ PO TBCR
40.0000 meq | EXTENDED_RELEASE_TABLET | Freq: Once | ORAL | Status: AC
  Administered 2018-11-10: 40 meq via ORAL
  Filled 2018-11-10: qty 2

## 2018-11-10 MED ORDER — MUPIROCIN CALCIUM 2 % EX CREA
TOPICAL_CREAM | Freq: Two times a day (BID) | CUTANEOUS | Status: DC
  Administered 2018-11-10 – 2018-11-11 (×2): via TOPICAL
  Filled 2018-11-10: qty 15

## 2018-11-10 MED ORDER — ALBUTEROL SULFATE (2.5 MG/3ML) 0.083% IN NEBU
2.5000 mg | INHALATION_SOLUTION | Freq: Four times a day (QID) | RESPIRATORY_TRACT | Status: DC
  Administered 2018-11-10 – 2018-11-11 (×3): 2.5 mg via RESPIRATORY_TRACT
  Filled 2018-11-10 (×5): qty 3

## 2018-11-10 MED ORDER — ENOXAPARIN SODIUM 30 MG/0.3ML ~~LOC~~ SOLN
30.0000 mg | SUBCUTANEOUS | Status: DC
  Administered 2018-11-10: 30 mg via SUBCUTANEOUS
  Filled 2018-11-10: qty 0.3

## 2018-11-10 NOTE — Care Management Important Message (Signed)
Important Message  Patient Details  Name: Mckenzie Taylor MRN: [id number] Date of Birth: 10-03-1940   Medicare Important Message Given:  Yes     Dannette Barbara 11/10/2018, 10:57 AM

## 2018-11-10 NOTE — Consult Note (Signed)
ANTICOAGULATION CONSULT NOTE - Initial Consult  Pharmacy Consult for Heparin Drip Indication: chest pain/ACS  Allergies  Allergen Reactions  . Other Rash    Pt reports allergy to metals.    Patient Measurements: Height: 5\' 5"  (165.1 cm) Weight: 211 lb 11.2 oz (96 kg) IBW/kg (Calculated) : 57 Heparin Dosing Weight: 78.7 kg  Vital Signs: Temp: 97.8 F (36.6 C) (08/28 0321) Temp Source: Oral (08/27 2004) BP: 117/65 (08/28 0321) Pulse Rate: 93 (08/28 0321)  Labs: Recent Labs    11/08/18 1010  11/08/18 1341  11/08/18 1755  11/08/18 2200 11/09/18 0015 11/09/18 0756 11/09/18 1812 11/10/18 0214  HGB 9.5*  --   --   --   --   --   --  9.3*  --   --  9.4*  HCT 32.0*  --   --   --   --   --   --  31.1*  --   --  31.7*  PLT 404*  --   --   --   --   --   --  366  --   --  377  APTT  --   --  29  --   --   --   --   --   --   --   --   LABPROT  --   --  12.8  --   --   --   --   --   --   --   --   INR  --   --  1.0  --   --   --   --   --   --   --   --   HEPARINUNFRC  --   --   --   --   --    < > <0.10*  --  <0.10* 0.24* 0.27*  CREATININE 2.17*  --   --   --   --   --   --  2.30*  --   --  2.45*  TROPONINIHS 130*   < >  --    < > 584*  --  556* 548*  --   --   --    < > = values in this interval not displayed.    Estimated Creatinine Clearance: 22.4 mL/min (A) (by C-G formula based on SCr of 2.45 mg/dL (H)).   Medical History: Past Medical History:  Diagnosis Date  . Anemia of chronic disease    Baseline hgb 8.0-8.9  . Autosomal recessive polycystic kidneys   . Avascular necrosis of hip (HCC)    bilateral  . CAD (coronary artery disease) 2004   2004 LHC with mild and nonobstructive CAD: 20% mid LAD stenosis  . Chronic combined systolic and diastolic heart failure (HCC)    HFrEF, EF 45% last echo, LVH, diastolic dysfunction 9741  . Chronic kidney disease    PCKD, CKD, adrenal adenoma, cyst  . COPD (chronic obstructive pulmonary disease) ([place])    3L home oxygen   . Diabetes type 2, controlled ([place])   . Former heavy tobacco smoker    1 pk / day. Estimates quit ~ 2015  . GERD (gastroesophageal reflux disease)   . HTN (hypertension)   . Hyperlipidemia with target LDL less than 70   . Hypothyroidism    subclinical. low TSH, normal thyroid panel. biopsy 2010  . Vitamin B12 deficiency     Medications:  Medications Prior to Admission  Medication Sig Dispense  Refill Last Dose  . albuterol (VENTOLIN HFA) 108 (90 BASE) MCG/ACT inhaler Inhale 2 puffs into the lungs every 4 (four) hours as needed.    prn at prn  . amLODipine (NORVASC) 10 MG tablet Take 10 mg by mouth daily.     11/07/2018 at Unknown time  . atorvastatin (LIPITOR) 40 MG tablet Take 40 mg by mouth every evening.    11/07/2018 at Unknown time  . busPIRone (BUSPAR) 5 MG tablet Take 5 mg by mouth 2 (two) times daily.   11/07/2018 at Unknown time  . Cholecalciferol (VITAMIN D3) 5000 units CAPS Take 1 capsule by mouth daily.   11/07/2018 at Unknown time  . Cyanocobalamin 1500 MCG TBDP Take 3 tablets by mouth daily.   11/07/2018 at Unknown time  . FLUoxetine (PROZAC) 40 MG capsule Take 40 mg by mouth every evening.    11/07/2018 at Unknown time  . Fluticasone-Umeclidin-Vilant (TRELEGY ELLIPTA) 100-62.5-25 MCG/INH AEPB Inhale 1 puff into the lungs daily.   11/07/2018 at Unknown time  . HUMALOG KWIKPEN 100 UNIT/ML KwikPen Inject 0-22 Units into the skin 3 (three) times daily with meals. Sliding scale   11/07/2018 at Unknown time  . ipratropium-albuterol (DUONEB) 0.5-2.5 (3) MG/3ML SOLN Take 3 mLs by nebulization 4 (four) times daily. 360 mL 0 11/07/2018 at Unknown time  . LEVEMIR FLEXTOUCH 100 UNIT/ML Pen Inject 43 Units into the skin at bedtime.    11/07/2018 at Unknown time  . metolazone (ZAROXOLYN) 2.5 MG tablet Take 2.5 mg by mouth daily.   11/07/2018 at Unknown time  . omeprazole (PRILOSEC) 20 MG capsule Take 20 mg by mouth daily.   11/07/2018 at Unknown time  . oxyCODONE-acetaminophen (PERCOCET) 10-325 MG  tablet Take 1 tablet by mouth 3 (three) times daily as needed for pain.    prn at prn  . promethazine (PHENERGAN) 25 MG tablet Take 25 mg by mouth every 6 (six) hours as needed for nausea.    prn at prn  . tiZANidine (ZANAFLEX) 4 MG tablet Take 4 mg by mouth 3 (three) times daily as needed for muscle spasms.    prn at prn  . torsemide (DEMADEX) 10 MG tablet Take 2 tablets (20 mg total) by mouth daily as needed (leg swelling). (Patient taking differently: Take 20 mg by mouth every other day. ) 20 tablet 0 11/07/2018 at Unknown time  . traZODone (DESYREL) 50 MG tablet Take 50 mg by mouth at bedtime.   11/07/2018 at Unknown time   Scheduled:  . amLODipine  10 mg Oral Daily  . aspirin EC  81 mg Oral Daily  . atorvastatin  40 mg Oral QPM  . busPIRone  5 mg Oral BID  . carvedilol  3.125 mg Oral BID WC  . FLUoxetine  40 mg Oral QPM  . fluticasone furoate-vilanterol  1 puff Inhalation Daily   And  . umeclidinium bromide  1 puff Inhalation Daily  . furosemide  40 mg Intravenous BID  . heparin  1,200 Units Intravenous Once  . insulin aspart  0-15 Units Subcutaneous TID WC  . insulin aspart  0-5 Units Subcutaneous QHS  . insulin detemir  20 Units Subcutaneous QHS  . pantoprazole  40 mg Oral Daily  . traZODone  50 mg Oral QHS   Infusions:  . heparin 1,550 Units/hr (11/09/18 2306)   PRN:  Anti-infectives (From admission, onward)   None      Assessment: Pharmacy consulted to initiate Heparin Drip on 78yo patient presenting to ED with chest  pain.  Patient has no previous history of anticoagulant therapy. Will initiate drip immediately.  8/26 2200 HL < 0.1 2400 units IV x 1 and increase rate to 1200 units/hr 8/27 0758 HL < 0.1  8/27 1812 HL 0.24  Goal of Therapy:  Heparin level 0.3-0.7 units/ml Monitor platelets by anticoagulation protocol: Yes   Plan:  08/28 @ 0200 HL 0.27 subtherapeutic. Will rebolus w/ heparin 1200 units IV x 1 and increase rate to 1650 units/hr and will recheck HL @  1200, CBC low stable will continue to monitor.  Tobie Lords, PharmD Clinical Pharmacist 11/10/2018 4:04 AM

## 2018-11-10 NOTE — Plan of Care (Signed)
  Problem: Education: Goal: Knowledge of General Education information will improve Description: Including pain rating scale, medication(s)/side effects and non-pharmacologic comfort measures Outcome: Progressing   Problem: Clinical Measurements: Goal: Respiratory complications will improve Outcome: Progressing   Problem: Pain Managment: Goal: General experience of comfort will improve Outcome: Progressing   

## 2018-11-10 NOTE — Progress Notes (Signed)
Progress Note  Patient Name: Mckenzie Taylor Date of Encounter: 11/10/2018  Primary Cardiologist: Ida Rogue, MD  Subjective   Breathing slightly better though still SOB w/ getting up to go to the bathroom.  She says that that is fairly common for her @ home and that she often uses prn albuterol for similar Ss - has not requested it here.  Abd less full.  Still not quite @ baseline.  No chest pain.  Not interested in stress testing.  Inpatient Medications    Scheduled Meds: . albuterol  2.5 mg Nebulization Q6H  . amLODipine  10 mg Oral Daily  . aspirin EC  81 mg Oral Daily  . atorvastatin  40 mg Oral QPM  . busPIRone  5 mg Oral BID  . carvedilol  3.125 mg Oral BID WC  . FLUoxetine  40 mg Oral QPM  . fluticasone furoate-vilanterol  1 puff Inhalation Daily   And  . umeclidinium bromide  1 puff Inhalation Daily  . guaiFENesin  600 mg Oral BID  . insulin aspart  0-15 Units Subcutaneous TID WC  . insulin aspart  0-5 Units Subcutaneous QHS  . insulin detemir  20 Units Subcutaneous QHS  . pantoprazole  40 mg Oral Daily  . [START ON 11/11/2018] torsemide  20 mg Oral Daily  . traZODone  50 mg Oral QHS   Continuous Infusions:  PRN Meds: acetaminophen, albuterol, ipratropium-albuterol, morphine injection, nitroGLYCERIN, ondansetron (ZOFRAN) IV, oxyCODONE-acetaminophen **AND** oxyCODONE, promethazine, tiZANidine   Vital Signs    Vitals:   11/09/18 1548 11/09/18 2004 11/10/18 0321 11/10/18 0842  BP: 124/71 114/65 117/65 123/73  Pulse: 96 99 93 91  Resp: 18 18  19   Temp: 98.6 F (37 C) 98 F (36.7 C) 97.8 F (36.6 C) 98.3 F (36.8 C)  TempSrc: Oral Oral  Oral  SpO2: 100% 98% 99% 99%  Weight:   93.7 kg   Height:        Intake/Output Summary (Last 24 hours) at 11/10/2018 1034 Last data filed at 11/10/2018 1026 Gross per 24 hour  Intake 480 ml  Output 1550 ml  Net -1070 ml   Filed Weights   11/08/18 1007 11/08/18 1616 11/10/18 0321  Weight: 96.2 kg 96 kg 93.7 kg     Physical Exam   GEN: Well nourished, well developed, in no acute distress.  HEENT: Grossly normal.  Neck: Supple, no JVD, carotid bruits, or masses. Cardiac: RRR, no murmurs, rubs, or gallops. No clubbing, cyanosis, edema.  Radials/DP/PT 1+ and equal bilaterally.  Respiratory:  Respirations regular and unlabored, diminished breath sounds bilat w/ scattered rales. GI: Soft, nontender, nondistended, BS + x 4. MS: no deformity or atrophy. Skin: warm and dry, no rash. Neuro:  Strength and sensation are intact. Psych: AAOx3.  Normal affect.  Labs    Chemistry Recent Labs  Lab 11/08/18 1010 11/08/18 2200 11/09/18 0015 11/10/18 0214  NA 140  --  140 140  K 3.7  --  3.1* 3.5  CL 101  --  100 100  CO2 29  --  30 31  GLUCOSE 152*  --  147* 137*  BUN 30*  --  33* 33*  CREATININE 2.17*  --  2.30* 2.45*  CALCIUM 8.8*  --  8.5* 8.4*  PROT  --  6.6  --   --   ALBUMIN  --  3.3*  --   --   AST  --  18  --   --   ALT  --  19  --   --   ALKPHOS  --  64  --   --   BILITOT  --  0.2*  --   --   GFRNONAA 21*  --  20* 19*  GFRAA 25*  --  23* 21*  ANIONGAP 10  --  10 9     Hematology Recent Labs  Lab 11/08/18 1010 11/09/18 0015 11/10/18 0214  WBC 11.2* 10.3 10.5  RBC 3.57* 3.50* 3.59*  HGB 9.5* 9.3* 9.4*  HCT 32.0* 31.1* 31.7*  MCV 89.6 88.9 88.3  MCH 26.6 26.6 26.2  MCHC 29.7* 29.9* 29.7*  RDW 17.5* 17.3* 17.3*  PLT 404* 366 377    High Sensitivity Troponin:   Recent Labs  Lab 11/08/18 1306 11/08/18 1615 11/08/18 1755 11/08/18 2200 11/09/18 0015  TROPONINIHS 356* 533* 584* 556* 548*     Radiology    Dg Chest 2 View  Result Date: 11/08/2018 CLINICAL DATA:  78 year old female with midsternal chest pain since last night. Pain radiating to the left shoulder. EXAM: CHEST - 2 VIEW COMPARISON:  Chest radiographs 02/10/2018 and earlier. FINDINGS: Stable lung volumes. Stable cardiac size at the upper limits of normal. Chronic linear scarring at both lung bases, and the  costophrenic angles. Stable ventilation. No pneumothorax, pulmonary edema or acute pulmonary opacity. Visualized tracheal air column is within normal limits. No acute osseous abnormality identified. Negative visible bowel gas pattern. IMPRESSION: Chronic lung base scarring.  No acute cardiopulmonary abnormality. Electronically Signed   By: Genevie Ann M.D.   On: 11/08/2018 11:00    Telemetry    RSR, 90's - Personally Reviewed  Cardiac Studies   2D Echocardiogram 8.27.2020  IMPRESSIONS   1. The left ventricle has low normal systolic function, with an ejection fraction of 50-55%. The cavity size was normal. There is mildly increased left ventricular wall thickness. Left ventricular diastolic Doppler parameters are consistent with  impaired relaxation.  2. The right ventricle has normal systolic function. The cavity was normal. There is no increase in right ventricular wall thickness. Right ventricular systolic pressure is normal with an estimated pressure of 26.2 mmHg.  3. Left atrial size was mildly dilated.  Patient Profile     78 y.o. female with a hx of mild nonobstructiveCAD (cath 2004),hypertension, hyperlipidemia,HFrEF,COPDon 3L home oxygen, GERDwith B12 deficiency, hypothyroidism(bx 2010, subclinical),CKD,autosomal recessivePCKD (h/o adrenal adenoma, 2010),anemia of chronic disease, DM2,history ofbilateralavascular necrosis of the hip, 5cm popliteal fossa cyst,and prior history of smoking 1 pack a day since the age of 14 (quit~2015)who is being seen today for the evaluation ofatypical chest pain with elevated troponin.  Assessment & Plan    1.  Atypical chest pain/Elevated troponin: Previously complained of c/p and chest wall tenderness in the setting of dyspnea/resp failure.  HsTrop peaked @ 584.  No c/p this AM.  Dyspnea persists but overall better.  Echo w/ EF 50-55%.  Discussed options for isch eval, and pt prefers conservative rx for the time being.  Cont asa,  statin,  blocker. Can reconsider stress testing as outpt, though in the setting of CKD III-IV, she is not an ideal cath candidate.  2.  Acute diastolic CHF:EF 17-40% w/ diast dysfxn by echo this admission.  BP stable.  HR mildly elevated.  Minus 1210 ml yesterday and minus 3.7L for admission. Wt down to 93.7 kg, which is the lowest that we have on record.  She received IV lasix this AM.  Appears relatively euvolemic on exam but difficult to gauge 2/2 body  habitus.  BUN/Bicarb/Creat bumping slightly.  Will hold diuretic this afternoon and change to torsemide 40mg  daily starting tomorrow - prior home dose 10 BID w/ metolazone 2.5 qod.  Will try not to use metolazone and simply use higher dose of torsemide.  3.  CKD III-IV/AKI: Transitioning back to oral torsemide starting tomorrow and BUN/Creat bumping slightly. F/u in AM.  4.  Anemia w/ FOB +: H/H has been stable.  D/C heparin.  W/u per IM.  5.  COPD: long h/o tob abuse. Quit 5 yrs ago. Certainly playing a role in resp failure this admission.  Overall, lungs clearer, less coarse.  She asked for mucinex, which she uses @ home and we have added.  She also uses albuterol regularly @ home.  Nebs are prn here and she has't been asking for it.  Will add albuterol MDI qid. Otw per IM.  6.  DMII:  A1c 8.3.  Per IM w/ outpt IM f/u.  7.  Hypokalemia:  Replete this AM and f/u tomorrow.  Signed, Murray Hodgkins, NP  11/10/2018, 10:34 AM    For questions or updates, please contact   Please consult [email or web address] for contact info under Cardiology/STEMI.

## 2018-11-10 NOTE — Progress Notes (Signed)
[place] at [facility]                                                                                                                                                                                  Patient Demographics   Mckenzie Taylor, is a 78 y.o. female, DOB - 12/06/1940, MPN:[id number]  Admit date - 11/08/2018   Admitting Physician Sela Hua, MD  Outpatient Primary MD for the patient is Denton Lank, MD   LOS - 2  Subjective: Patient continues to be short of breath but states is improved  Review of Systems:   CONSTITUTIONAL: No documented fever. No fatigue, weakness. No weight gain, no weight loss.  EYES: No blurry or double vision.  ENT: No tinnitus. No postnasal drip. No redness of the oropharynx.  RESPIRATORY: No cough, no wheeze, no hemoptysis.  Positive dyspnea.  CARDIOVASCULAR: No chest pain. No orthopnea. No palpitations. No syncope.  GASTROINTESTINAL: No nausea, no vomiting or diarrhea. No abdominal pain. No melena or hematochezia.  GENITOURINARY: No dysuria or hematuria.  ENDOCRINE: No polyuria or nocturia. No heat or cold intolerance.  HEMATOLOGY: No anemia. No bruising. No bleeding.  INTEGUMENTARY: No rashes. No lesions.  MUSCULOSKELETAL: No arthritis. No swelling. No gout.  NEUROLOGIC: No numbness, tingling, or ataxia. No seizure-type activity.  PSYCHIATRIC: No anxiety. No insomnia. No ADD.    Vitals:   Vitals:   11/09/18 1548 11/09/18 2004 11/10/18 0321 11/10/18 0842  BP: 124/71 114/65 117/65 123/73  Pulse: 96 99 93 91  Resp: 18 18  19   Temp: 98.6 F (37 C) 98 F (36.7 C) 97.8 F (36.6 C) 98.3 F (36.8 C)  TempSrc: Oral Oral  Oral  SpO2: 100% 98% 99% 99%  Weight:   93.7 kg   Height:        Wt Readings from Last 3 Encounters:  11/10/18 93.7 kg  02/10/18 104.2 kg  01/10/18 106.9 kg     Intake/Output Summary (Last 24 hours) at 11/10/2018 1531 Last data filed at 11/10/2018 1402 Gross per 24 hour  Intake 720 ml   Output 1600 ml  Net -880 ml    Physical Exam:   GENERAL: Pleasant-appearing in no apparent distress.  HEAD, EYES, EARS, NOSE AND THROAT: Atraumatic, normocephalic. Extraocular muscles are intact. Pupils equal and reactive to light. Sclerae anicteric. No conjunctival injection. No oro-pharyngeal erythema.  NECK: Supple. There is no jugular venous distention. No bruits, no lymphadenopathy, no thyromegaly.  HEART: Regular rate and rhythm,. No murmurs, no rubs, no clicks.  LUNGS: Crackles at the bases bilateral wheezing ABDOMEN: Soft, flat, nontender, nondistended. Has good bowel sounds. No hepatosplenomegaly appreciated.  EXTREMITIES: No evidence of  any cyanosis, clubbing, or peripheral edema.  +2 pedal and radial pulses bilaterally.  NEUROLOGIC: The patient is alert, awake, and oriented x3 with no focal motor or sensory deficits appreciated bilaterally.  SKIN: Moist and warm with no rashes appreciated.  Psych: Not anxious, depressed LN: No inguinal LN enlargement    Antibiotics   Anti-infectives (From admission, onward)   None      Medications   Scheduled Meds: . albuterol  2.5 mg Nebulization Q6H  . amLODipine  10 mg Oral Daily  . aspirin EC  81 mg Oral Daily  . atorvastatin  40 mg Oral QPM  . busPIRone  5 mg Oral BID  . carvedilol  3.125 mg Oral BID WC  . enoxaparin (LOVENOX) injection  30 mg Subcutaneous Q24H  . FLUoxetine  40 mg Oral QPM  . fluticasone furoate-vilanterol  1 puff Inhalation Daily   And  . umeclidinium bromide  1 puff Inhalation Daily  . guaiFENesin  600 mg Oral BID  . insulin aspart  0-15 Units Subcutaneous TID WC  . insulin aspart  0-5 Units Subcutaneous QHS  . insulin detemir  20 Units Subcutaneous QHS  . mupirocin cream   Topical BID  . pantoprazole  40 mg Oral Daily  . [START ON 11/11/2018] torsemide  20 mg Oral Daily  . traZODone  50 mg Oral QHS   Continuous Infusions:  PRN Meds:.acetaminophen, albuterol, ipratropium-albuterol, morphine  injection, nitroGLYCERIN, ondansetron (ZOFRAN) IV, oxyCODONE-acetaminophen **AND** oxyCODONE, promethazine, tiZANidine   Data Review:   Micro Results Recent Results (from the past 240 hour(s))  Novel Coronavirus, NAA (Hosp order, Send-out to Ref Lab; TAT 18-24 hrs     Status: None   Collection Time: 11/08/18  1:07 PM   Specimen: Nasopharyngeal Swab; Respiratory  Result Value Ref Range Status   SARS-CoV-2, NAA NOT DETECTED NOT DETECTED Final    Comment: (NOTE) This test was developed and its performance characteristics determined by [employer]. This test has not been FDA cleared or approved. This test has been authorized by FDA under an Emergency Use Authorization (EUA). This test is only authorized for the duration of time the declaration that circumstances exist justifying the authorization of the emergency use of in vitro diagnostic tests for detection of SARS-CoV-2 virus and/or diagnosis of COVID-19 infection under section 564(b)(1) of the Act, 21 U.S.C. 182XHB-7(J)(6), unless the authorization is terminated or revoked sooner. When diagnostic testing is negative, the possibility of a false negative result should be considered in the context of a patient's recent exposures and the presence of clinical signs and symptoms consistent with COVID-19. An individual without symptoms of COVID-19 and who is not shedding SARS-CoV-2 virus would expect to have a negative (not detected) result in this assay. Performed  At: [facility] [place], [place] [id number] Rush Farmer MD FB:[phone]    [place]  Final    Comment: Performed at [facility], [address] [place], [place] [address]    Radiology Reports Dg Chest 2 View  Result Date: 11/08/2018 CLINICAL DATA:  78 year old female with midsternal chest pain since last night. Pain radiating to the left shoulder. EXAM: CHEST - 2 VIEW COMPARISON:  Chest radiographs  02/10/2018 and earlier. FINDINGS: Stable lung volumes. Stable cardiac size at the upper limits of normal. Chronic linear scarring at both lung bases, and the costophrenic angles. Stable ventilation. No pneumothorax, pulmonary edema or acute pulmonary opacity. Visualized tracheal air column is within normal limits. No acute osseous abnormality identified.  Negative visible bowel gas pattern. IMPRESSION: Chronic lung base scarring.  No acute cardiopulmonary abnormality. Electronically Signed   By: Genevie Ann M.D.   On: 11/08/2018 11:00     CBC Recent Labs  Lab 11/08/18 1010 11/09/18 0015 11/10/18 0214  WBC 11.2* 10.3 10.5  HGB 9.5* 9.3* 9.4*  HCT 32.0* 31.1* 31.7*  PLT 404* 366 377  MCV 89.6 88.9 88.3  MCH 26.6 26.6 26.2  MCHC 29.7* 29.9* 29.7*  RDW 17.5* 17.3* 17.3*    Chemistries  Recent Labs  Lab 11/08/18 1010 11/08/18 2200 11/09/18 0015 11/10/18 0214  NA 140  --  140 140  K 3.7  --  3.1* 3.5  CL 101  --  100 100  CO2 29  --  30 31  GLUCOSE 152*  --  147* 137*  BUN 30*  --  33* 33*  CREATININE 2.17*  --  2.30* 2.45*  CALCIUM 8.8*  --  8.5* 8.4*  MG  --  1.3*  --   --   AST  --  18  --   --   ALT  --  19  --   --   ALKPHOS  --  64  --   --   BILITOT  --  0.2*  --   --    ------------------------------------------------------------------------------------------------------------------ estimated creatinine clearance is 22.1 mL/min (A) (by C-G formula based on SCr of 2.45 mg/dL (H)). ------------------------------------------------------------------------------------------------------------------ Recent Labs    11/08/18 1615  HGBA1C 8.3*   ------------------------------------------------------------------------------------------------------------------ Recent Labs    11/09/18 0015  CHOL 192  HDL 31*  LDLCALC UNABLE TO CALCULATE IF TRIGLYCERIDE OVER 400 mg/dL  TRIG 465*  CHOLHDL 6.2  LDLDIRECT 99.3*    ------------------------------------------------------------------------------------------------------------------ Recent Labs    11/08/18 2200  TSH 1.317   ------------------------------------------------------------------------------------------------------------------ Recent Labs    11/09/18 0015  FERRITIN 27  TIBC 356  IRON 37    Coagulation profile Recent Labs  Lab 11/08/18 1341  INR 1.0    No results for input(s): DDIMER in the last 72 hours.  Cardiac Enzymes No results for input(s): CKMB, TROPONINI, MYOGLOBIN in the last 168 hours.  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    Continue with nebs steroids due to exasperation  Depression/anxiety-stable -Continue home Buspar, Prozac, trazodone  Chronic normocytic anemia-hemoglobin at baseline -Monitor     Code Status Orders  (From admission, onward)         Start     Ordered   11/08/18 1558  Do not attempt resuscitation (DNR)  Continuous    Question Answer Comment  In the event of cardiac or respiratory ARREST Do not call a "code blue"   In the event of cardiac or respiratory ARREST Do not perform Intubation, CPR, defibrillation or  ACLS   In the event of cardiac or respiratory ARREST  Use medication by any route, position, wound care, and other measures to relive pain and suffering. May use oxygen, suction and manual treatment of airway obstruction as needed for comfort.      11/08/18 1557        Code Status History    Date Active Date Inactive Code Status Order ID Comments User Context   02/10/2018 2149 02/12/2018 2141 Full Code [id number]  Demetrios Loll, MD Inpatient   01/04/2018 1347 01/10/2018 1449 Full Code [id number]  Hillary Bow, MD ED   01/07/2017 1624 01/08/2017 1240 DNR [id number]  Asencion Gowda, NP Inpatient   01/06/2017 1838 01/07/2017 1624 Partial Code [id number]  Vernell Morgans, RN Inpatient   01/06/2017 1548 01/06/2017 1838 Full Code [id number]  Bettey Costa, MD Inpatient   01/06/2017 1233 01/06/2017 1548 DNR [id number]  Bettey Costa, MD Inpatient   11/26/2016 1003 11/27/2016 1545 DNR [id number]  Hillary Bow, MD Inpatient   11/25/2016 2126 11/26/2016 1003 Full Code [id number]  Bettey Costa, MD Inpatient   10/25/2016 1614 10/29/2016 1725 Full Code [id number]  Henreitta Leber, MD ED   08/14/2016 2332 08/19/2016 1657 Full Code [id number]  Lance Coon, MD Inpatient   05/05/2016 0747 05/07/2016 1804 Full Code [id number]  Flora Lipps, MD ED   03/24/2016 1416 03/28/2016 1424 Full Code [id number]  Awilda Bill, NP ED   Advance Care Planning Activity           Consults cards  DVT Prophylaxis  heaprin  Lab Results  Component Value Date   PLT 377 11/10/2018     Time Spent in minutes  73min  Greater than 50% of time spent in care coordination and counseling patient regarding the condition and plan of care.   Dustin Flock M.D on 11/10/2018 at 3:31 PM  Between 7am to 6pm - Pager - [phone]  After 6pm go to [email or web address] - Proofreader  [employer]   Office  [phone]

## 2018-11-11 LAB — CBC
HCT: 32.3 % — ABNORMAL LOW (ref 36.0–46.0)
Hemoglobin: 9.1 g/dL — ABNORMAL LOW (ref 12.0–15.0)
MCH: 25.7 pg — ABNORMAL LOW (ref 26.0–34.0)
MCHC: 28.2 g/dL — ABNORMAL LOW (ref 30.0–36.0)
MCV: 91.2 fL (ref 80.0–100.0)
Platelets: 342 10*3/uL (ref 150–400)
RBC: 3.54 MIL/uL — ABNORMAL LOW (ref 3.87–5.11)
RDW: 17.1 % — ABNORMAL HIGH (ref 11.5–15.5)
WBC: 9.6 10*3/uL (ref 4.0–10.5)
nRBC: 0 % (ref 0.0–0.2)

## 2018-11-11 LAB — BASIC METABOLIC PANEL
Anion gap: 8 (ref 5–15)
BUN: 31 mg/dL — ABNORMAL HIGH (ref 8–23)
CO2: 30 mmol/L (ref 22–32)
Calcium: 8.7 mg/dL — ABNORMAL LOW (ref 8.9–10.3)
Chloride: 103 mmol/L (ref 98–111)
Creatinine, Ser: 2.25 mg/dL — ABNORMAL HIGH (ref 0.44–1.00)
GFR calc Af Amer: 24 mL/min — ABNORMAL LOW (ref >60)
GFR calc non Af Amer: 21 mL/min — ABNORMAL LOW (ref >60)
Glucose, Bld: 121 mg/dL — ABNORMAL HIGH (ref 70–99)
Potassium: 3.8 mmol/L (ref 3.5–5.1)
Sodium: 141 mmol/L (ref 135–145)

## 2018-11-11 LAB — GLUCOSE, CAPILLARY: Glucose-Capillary: 131 mg/dL — ABNORMAL HIGH (ref 70–99)

## 2018-11-11 MED ORDER — CARVEDILOL 6.25 MG PO TABS: 6.2500 mg | ORAL_TABLET | Freq: Two times a day (BID) | ORAL | Status: DC

## 2018-11-11 MED ORDER — ASPIRIN 81 MG PO TBEC: 81.0000 mg | DELAYED_RELEASE_TABLET | Freq: Every day | ORAL | 2 refills | Status: AC

## 2018-11-11 MED ORDER — CARVEDILOL 6.25 MG PO TABS: 6.2500 mg | ORAL_TABLET | Freq: Two times a day (BID) | ORAL | 2 refills | Status: DC

## 2018-11-11 MED ORDER — TORSEMIDE 20 MG PO TABS: 20.0000 mg | ORAL_TABLET | Freq: Every day | ORAL | 2 refills | Status: DC

## 2018-11-11 MED ORDER — METOLAZONE 2.5 MG PO TABS: 2.5000 mg | ORAL_TABLET | Freq: Every day | ORAL | 1 refills | Status: DC | PRN

## 2018-11-11 NOTE — Plan of Care (Signed)
  Problem: Clinical Measurements: Goal: Ability to maintain clinical measurements within normal limits will improve Outcome: Progressing Goal: Cardiovascular complication will be avoided Outcome: Progressing   Problem: Activity: Goal: Risk for activity intolerance will decrease Outcome: Progressing   Problem: Coping: Goal: Level of anxiety will decrease Outcome: Progressing   Problem: Elimination: Goal: Will not experience complications related to bowel motility Outcome: Progressing Goal: Will not experience complications related to urinary retention Outcome: Progressing   Problem: Pain Managment: Goal: General experience of comfort will improve Outcome: Progressing   Problem: Safety: Goal: Ability to remain free from injury will improve Outcome: Progressing   Problem: Cardiac: Goal: Ability to achieve and maintain adequate cardiovascular perfusion will improve Outcome: Progressing

## 2018-11-11 NOTE — Progress Notes (Addendum)
Progress Note  Patient Name: Mckenzie Taylor Date of Encounter: 11/11/2018  Primary Cardiologist: Ida Rogue, MD   Subjective   She feels much better. Breathing is near baseline. LE edema is better. No chest pain.   Inpatient Medications    Scheduled Meds: . albuterol  2.5 mg Nebulization Q6H  . amLODipine  10 mg Oral Daily  . aspirin EC  81 mg Oral Daily  . atorvastatin  40 mg Oral QPM  . busPIRone  5 mg Oral BID  . carvedilol  6.25 mg Oral BID WC  . enoxaparin (LOVENOX) injection  30 mg Subcutaneous Q24H  . FLUoxetine  40 mg Oral QPM  . fluticasone furoate-vilanterol  1 puff Inhalation Daily   And  . umeclidinium bromide  1 puff Inhalation Daily  . guaiFENesin  600 mg Oral BID  . insulin aspart  0-15 Units Subcutaneous TID WC  . insulin aspart  0-5 Units Subcutaneous QHS  . insulin detemir  20 Units Subcutaneous QHS  . mupirocin cream   Topical BID  . pantoprazole  40 mg Oral Daily  . torsemide  20 mg Oral Daily  . traZODone  50 mg Oral QHS   Continuous Infusions:  PRN Meds: acetaminophen, albuterol, ipratropium-albuterol, morphine injection, nitroGLYCERIN, ondansetron (ZOFRAN) IV, oxyCODONE-acetaminophen **AND** oxyCODONE, promethazine, tiZANidine   Vital Signs    Vitals:   11/10/18 1551 11/10/18 2024 11/11/18 0514 11/11/18 0736  BP: (!) 120/59 138/75 115/62 134/74  Pulse: 85 84 90 87  Resp: 17 19 18 18   Temp: 98.3 F (36.8 C) 98.7 F (37.1 C) 98.2 F (36.8 C) 97.9 F (36.6 C)  TempSrc: Oral Oral Oral Oral  SpO2: 96% 99% 99% 100%  Weight:   93.4 kg   Height:        Intake/Output Summary (Last 24 hours) at 11/11/2018 0839 Last data filed at 11/11/2018 0212 Gross per 24 hour  Intake 720 ml  Output 1070 ml  Net -350 ml   Last 3 Weights 11/11/2018 11/10/2018 11/08/2018  Weight (lbs) 205 lb 12.8 oz 206 lb 8 oz 211 lb 11.2 oz  Weight (kg) 93.35 kg 93.668 kg 96.026 kg      Telemetry    sinus - Personally Reviewed  ECG    No am ekg -  Personally Reviewed  Physical Exam   GEN: obese female in no acute distress.   Neck: unable to assess for JVD due to body habitus Cardiac: RRR, no murmurs, rubs, or gallops.  Respiratory: Clear to auscultation bilaterally. GI: Soft, nontender, non-distended  EXT: No LE edema; Neuro:  Nonfocal  Psych: Normal affect   Labs    High Sensitivity Troponin:   Recent Labs  Lab 11/08/18 1306 11/08/18 1615 11/08/18 1755 11/08/18 2200 11/09/18 0015  TROPONINIHS 356* 533* 584* 556* 548*      Chemistry Recent Labs  Lab 11/08/18 2200 11/09/18 0015 11/10/18 0214 11/11/18 0533  NA  --  140 140 141  K  --  3.1* 3.5 3.8  CL  --  100 100 103  CO2  --  30 31 30   GLUCOSE  --  147* 137* 121*  BUN  --  33* 33* 31*  CREATININE  --  2.30* 2.45* 2.25*  CALCIUM  --  8.5* 8.4* 8.7*  PROT 6.6  --   --   --   ALBUMIN 3.3*  --   --   --   AST 18  --   --   --   ALT  19  --   --   --   ALKPHOS 64  --   --   --   BILITOT 0.2*  --   --   --   GFRNONAA  --  20* 19* 21*  GFRAA  --  23* 21* 24*  ANIONGAP  --  10 9 8      Hematology Recent Labs  Lab 11/09/18 0015 11/10/18 0214 11/11/18 0533  WBC 10.3 10.5 9.6  RBC 3.50* 3.59* 3.54*  HGB 9.3* 9.4* 9.1*  HCT 31.1* 31.7* 32.3*  MCV 88.9 88.3 91.2  MCH 26.6 26.2 25.7*  MCHC 29.9* 29.7* 28.2*  RDW 17.3* 17.3* 17.1*  PLT 366 377 342    BNPNo results for input(s): BNP, PROBNP in the last 168 hours.   DDimer No results for input(s): DDIMER in the last 168 hours.   Radiology    No results found.  Cardiac Studies   Echo 11/09/18:   1. The left ventricle has low normal systolic function, with an ejection fraction of 50-55%. The cavity size was normal. There is mildly increased left ventricular wall thickness. Left ventricular diastolic Doppler parameters are consistent with  impaired relaxation.  2. The right ventricle has normal systolic function. The cavity was normal. There is no increase in right ventricular wall thickness. Right  ventricular systolic pressure is normal with an estimated pressure of 26.2 mmHg.  3. Left atrial size was mildly dilated.  Patient Profile     78 y.o. female with a hx of mild nonobstructiveCAD (cath 2004),hypertension, hyperlipidemia,HFrEF,COPDon 3L home oxygen, GERDwith B12 deficiency, hypothyroidism(bx 2010, subclinical),CKD,autosomal recessivePCKD (h/o adrenal adenoma, 2010),anemia of chronic disease, DM2,history ofbilateralavascular necrosis of the hip, 5cm popliteal fossa cyst,and prior history of smoking 1 pack a day since the age of 44 (quit~2015)who is being seen today for the evaluation ofatypical chest pain with elevated troponin.  Assessment & Plan    1. Atypical chest pain/Elevated troponin: She had chest pain at the time of admission. This was described as chest wall tenderness to touch. High sens troponin 584, likely due to demand ischemia in the setting of respiratory distress with COPD exacerbation and volume overload. Echo with LVEF=50-55%. Pt did not wish to have an ischemic evaluation. Her chest pain is better. No plans for inpatient ischemic testing. Will continue ASA, statin and beta blocker.  -She appears to be ready for discharge from a cardiac perspective.  -Cardiology will sign off. Please call with questions. We will make an appt for follow up in our office.   2. Acute diastolic CHF: Volume status ok this am. She is negative 3.2 liters since admission. Weight down to 93.3 kg. Continue Torsemide.   3. COPD exacerbation: per primary team  4. Chronic kidney disease stage 3/4: Renal function stable this am. Continue current therapy   For questions or updates, please contact [place] Please consult [email or web address] for contact info under        Signed, Lauree Chandler, MD  11/11/2018, 8:39 AM

## 2018-11-11 NOTE — Discharge Instructions (Signed)
Angina  Angina is extreme discomfort in the chest, neck, arm, jaw, or back. The discomfort is caused by a lack of blood in the middle layer of the heart wall (myocardium). There are four types of angina:  Stable angina. This is triggered by vigorous activity or exercise. It goes away when you rest or take angina medicine.  Unstable angina. This is a warning sign and can lead to a heart attack (acute coronary syndrome). This is a medical emergency. Symptoms come at rest and last a long time.  Microvascular angina. This affects the small coronary arteries. Symptoms include feeling tired and being short of breath.  Prinzmetal or variant angina. This is caused by a tightening (spasm) of the arteries that go to your heart. What are the causes? This condition is caused by atherosclerosis. This is the buildup of fat and cholesterol (plaque) in your arteries. The plaque may narrow or block the artery. Other causes of angina include:  Sudden tightening of the muscles of the arteries in the heart (coronary spasm).  Small artery disease (microvascular dysfunction).  Problems with any of your heart valves (heart valve disease).  A tear in an artery in your heart (coronary artery dissection).  Diseases of the heart muscle (cardiomyopathy), or other heart diseases. What increases the risk? You are more likely to develop this condition if you have:  High cholesterol.  High blood pressure (hypertension).  Diabetes.  A family history of heart disease.  An inactive (sedentary) lifestyle, or you do not exercise enough.  Depression.  Had radiation treatment to the left side of your chest. Other risk factors include:  Using tobacco.  Being obese.  Eating a diet high in saturated fats.  Being exposed to high stress or triggers of stress.  Using drugs, such as cocaine. Women have a greater risk for angina if:  They are older than 3.  They have gone through menopause (are  postmenopausal). What are the signs or symptoms? Common symptoms of this condition in both men and women may include:  Chest pain, which may: ? Feel like a crushing or squeezing in the chest, or like a tightness, pressure, fullness, or heaviness in the chest. ? Last for more than a few minutes at a time, or it may stop and come back (recur) over the course of a few minutes.  Pain in the neck, arm, jaw, or back.  Unexplained heartburn or indigestion.  Shortness of breath.  Nausea.  Sudden cold sweats. Women and people with diabetes may have unusual (atypical) symptoms, such as:  Fatigue.  Unexplained feelings of nervousness or anxiety.  Unexplained weakness.  Dizziness or fainting. How is this diagnosed? This condition may be diagnosed based on:  Your symptoms and medical history.  Electrocardiogram (ECG) to measure the electrical activity in your heart.  Blood tests.  Stress test to look for signs of blockage when your heart is stressed.  CT angiogram to examine your heart and the blood flow to it.  Coronary angiogram to check your coronary arteries for blockage. How is this treated? Angina may be treated with:  Medicines to: ? Prevent blood clots and heart attack. ? Relax blood vessels and improve blood flow to the heart (nitrates). ? Reduce blood pressure, improve the pumping action of the heart, and relax blood vessels that are spasming. ? Reduce cholesterol and help treat atherosclerosis.  A procedure to widen a narrowed or blocked coronary artery (angioplasty). A mesh tube may be placed in a coronary artery to  keep it open (coronary stenting).  Surgery to allow blood to go around a blocked artery (coronary artery bypass surgery). Follow these instructions at home: Medicines  Take over-the-counter and prescription medicines only as told by your health care provider.  Do not take the following medicines unless your health care provider approves: ? NSAIDs,  such as ibuprofen or naproxen. ? Vitamin supplements that contain vitamin A, vitamin E, or both. ? Hormone replacement therapy that contains estrogen with or without progestin. Eating and drinking   Eat a heart-healthy diet. This includes plenty of fresh fruits and vegetables, whole grains, low-fat (lean) protein, and low-fat dairy products.  Follow instructions from your health care provider about eating or drinking restrictions. Activity  Follow an exercise program approved by your health care provider.  Consider joining a cardiac rehabilitation program.  Take a break when you feel fatigued. Plan rest periods in your daily activities. Lifestyle   Do not use any products that contain nicotine or tobacco, such as cigarettes, e-cigarettes, and chewing tobacco. If you need help quitting, ask your health care provider.  If your health care provider says you can drink alcohol: ? Limit how much you use to:  0-1 drink a day for nonpregnant women.  0-2 drinks a day for men. ? Be aware of how much alcohol is in your drink. In the [place] one drink equals one 12 oz bottle of beer (355 mL), one 5 oz glass of wine (148 mL), or one 1 oz glass of hard liquor (44 mL). General instructions  Maintain a healthy weight.  Learn to manage stress.  Keep your vaccinations up to date. Get the flu (influenza) vaccine every year.  Talk to your health care provider if you feel depressed. Take a depression screening test to see if you are at risk for depression.  Work with your health care provider to manage other health conditions, such as hypertension or diabetes.  Keep all follow-up visits as told by your health care provider. This is important. Get help right away if:  You have pain in your chest, neck, arm, jaw, or back, and the pain: ? Lasts more than a few minutes. ? Is recurring. ? Is not relieved by taking medicines under the tongue (sublingual nitroglycerin). ? Increases in intensity or  frequency.  You have a lot of sweating without cause.  You have unexplained: ? Heartburn or indigestion. ? Shortness of breath or difficulty breathing. ? Nausea or vomiting. ? Fatigue. ? Feelings of nervousness or anxiety. ? Weakness.  You have sudden light-headedness or dizziness.  You faint. These symptoms may represent a serious problem that is an emergency. Do not wait to see if the symptoms will go away. Get medical help right away. Call your local emergency services (911 in the [place] Do not drive yourself to the hospital. Summary  Angina is extreme discomfort in the chest, neck, arm, jaw, or back that is caused by a lack of blood in the heart wall.  There are many symptoms of angina. They include chest pain, unexplained heartburn or indigestion, sudden cold sweats, and fatigue.  Angina may be treated with behavioral changes, medicine, or surgery.  Symptoms of angina may represent an emergency. Get medical help right away. Call your local emergency services (911 in the [place] Do not drive yourself to the hospital. This information is not intended to replace advice given to you by your health care provider. Make sure you discuss any questions you have with your health care provider.  Document Released: 03/01/2005 Document Revised: 10/17/2017 Document Reviewed: 10/17/2017 Elsevier Patient Education  2020 [employer].

## 2018-11-11 NOTE — Discharge Summary (Signed)
[facility] [facility] [place] NAME: Mckenzie Taylor    MR#:  [id number]  DATE OF BIRTH:  [birth year]  DATE OF ADMISSION:  11/08/2018   ADMITTING PHYSICIAN: Sela Hua, MD  DATE OF DISCHARGE: 11/11/2018 11:55 AM  PRIMARY CARE PHYSICIAN: Denton Lank, MD   ADMISSION DIAGNOSIS:   NSTEMI (non-ST elevated myocardial infarction) ([place]) [I21.4]  DISCHARGE DIAGNOSIS:   Principal Problem:   Demand ischemia [facility]) Active Problems:   Coronary atherosclerosis of native coronary artery   Acute respiratory failure (HCC)   COPD (chronic obstructive pulmonary disease) ([place])   Acute on chronic combined systolic and diastolic CHF (congestive heart failure) ([place])   SECONDARY DIAGNOSIS:   Past Medical History:  Diagnosis Date  . Anemia of chronic disease    Baseline hgb 8.0-8.9  . Autosomal recessive polycystic kidneys   . Avascular necrosis of hip (HCC)    bilateral  . CAD (coronary artery disease) 2004   2004 LHC with mild and nonobstructive CAD: 20% mid LAD stenosis  . Chronic combined systolic and diastolic heart failure (HCC)    HFrEF, EF 45% last echo, LVH, diastolic dysfunction 6440  . Chronic kidney disease    PCKD, CKD, adrenal adenoma, cyst  . COPD (chronic obstructive pulmonary disease) ([place])    3L home oxygen  . Diabetes type 2, controlled ([place])   . Former heavy tobacco smoker    1 pk / day. Estimates quit ~ 2015  . GERD (gastroesophageal reflux disease)   . HTN (hypertension)   . Hyperlipidemia with target LDL less than 70   . Hypothyroidism    subclinical. low TSH, normal thyroid panel. biopsy 2010  . Vitamin B12 deficiency     HOSPITAL COURSE:   78 year old female with past medical history significant for nonobstructive CAD, hypertension, hyperlipidemia, chronic respiratory failure secondary to COPD on 3 L home oxygen, GERD, hypothyroidism, CKD, polycystic kidney disease, anemia of chronic disease, diabetes, bilateral  avascular necrosis of hip presents to hospital secondary to chest pain and elevated troponin.  1.  Atypical chest pain-with elevated troponin.  Prior history of nonobstructive CAD in 2004. -However patient refused any stress test or cardiac catheterization.  So it is considered to be demand ischemia at this time.  Underlying CAD cannot be ruled out given her complex cardiac history. -Echo showing EF of 50 to 55% without any wall motion abnormalities. -Continue aspirin, statin and beta-blocker. -Outpatient follow-up with cardiology recommended. -Patient has been ambulating without any chest pain.  2.  Diabetes mellitus-continue Levemir and Humalog with meals  3.  Chronic respiratory failure secondary to COPD-stable.  Has chronic cough.  Continue nebulizer, inhalers.  No acute exacerbation noted.  4.  Chronic diastolic CHF-doses adjusted, torsemide changed to daily and metolazone changed to PRN  5.  Depression anxiety-patient on Prozac and BuSpar.  Patient is already set up with [facility], will be discharged home today  DISCHARGE CONDITIONS:   Guarded  CONSULTS OBTAINED:   Treatment Team:  Minna Merritts, MD  DRUG ALLERGIES:   Allergies  Allergen Reactions  . Other Rash    Pt reports allergy to metals.   DISCHARGE MEDICATIONS:   Allergies as of 11/11/2018      Reactions   Other Rash   Pt reports allergy to metals.      Medication List    TAKE these medications   amLODipine 10 MG tablet Commonly known as: NORVASC Take 10 mg by mouth daily. Notes  to patient: Tomorrow at [place].    aspirin 81 MG EC tablet Take 1 tablet (81 mg total) by mouth daily. Notes to patient: Tomorrow at 9A.    atorvastatin 40 MG tablet Commonly known as: LIPITOR Take 40 mg by mouth every evening. Notes to patient: Tonight at 6P.    busPIRone 5 MG tablet Commonly known as: BUSPAR Take 5 mg by mouth 2 (two) times daily. Notes to patient: Tonight at 9P.    carvedilol 6.25 MG  tablet Commonly known as: COREG Take 1 tablet (6.25 mg total) by mouth 2 (two) times daily with a meal. Notes to patient: Today w/ dinner.    Cyanocobalamin 1500 MCG Tbdp Take 3 tablets by mouth daily. Notes to patient: Today when home.    FLUoxetine 40 MG capsule Commonly known as: PROZAC Take 40 mg by mouth every evening. Notes to patient: Tonight at [place].    HumaLOG KwikPen 100 UNIT/ML KwikPen Generic drug: insulin lispro Inject 0-22 Units into the skin 3 (three) times daily with meals. Sliding scale Notes to patient: Today w/ lunch.    ipratropium-albuterol 0.5-2.5 (3) MG/3ML Soln Commonly known as: DUONEB Take 3 mLs by nebulization 4 (four) times daily. Notes to patient: Today when home.    Levemir FlexTouch 100 UNIT/ML Pen Generic drug: Insulin Detemir Inject 43 Units into the skin at bedtime. Notes to patient: Tonight at bedtime.    metolazone 2.5 MG tablet Commonly known as: ZAROXOLYN Take 1 tablet (2.5 mg total) by mouth daily as needed (for weight gain > 3 pounds in 1 day). What changed:   when to take this  reasons to take this   omeprazole 20 MG capsule Commonly known as: PRILOSEC Take 20 mg by mouth daily. Notes to patient: Today when home.    oxyCODONE-acetaminophen 10-325 MG tablet Commonly known as: PERCOCET Take 1 tablet by mouth 3 (three) times daily as needed for pain.   promethazine 25 MG tablet Commonly known as: PHENERGAN Take 25 mg by mouth every 6 (six) hours as needed for nausea.   tiZANidine 4 MG tablet Commonly known as: ZANAFLEX Take 4 mg by mouth 3 (three) times daily as needed for muscle spasms.   torsemide 20 MG tablet Commonly known as: DEMADEX Take 1 tablet (20 mg total) by mouth daily. Start taking on: November 12, 2018 What changed:   medication strength  when to take this  reasons to take this Notes to patient: Tomorrow at 9A.    traZODone 50 MG tablet Commonly known as: DESYREL Take 50 mg by mouth at  bedtime. Notes to patient: Tonight at bedtime.    Trelegy Ellipta 100-62.5-25 MCG/INH Aepb Generic drug: Fluticasone-Umeclidin-Vilant Inhale 1 puff into the lungs daily. Notes to patient: Today when home.    Ventolin HFA 108 (90 Base) MCG/ACT inhaler Generic drug: albuterol Inhale 2 puffs into the lungs every 4 (four) hours as needed.   Vitamin D3 125 MCG (5000 UT) Caps Take 1 capsule by mouth daily. Notes to patient: Today when home.         DISCHARGE INSTRUCTIONS:   1.  PCP follow-up in 1 to 2 weeks 2.  Cardiology follow-up in 1 to 2 weeks  DIET:   Cardiac diet  ACTIVITY:   Activity as tolerated  OXYGEN:   Home Oxygen: Yes.    Oxygen Delivery: 3 liters/min via Patient connected to nasal cannula oxygen  DISCHARGE LOCATION:   home   If you experience worsening of your admission symptoms, develop shortness  of breath, life threatening emergency, suicidal or homicidal thoughts you must seek medical attention immediately by calling 911 or calling your MD immediately  if symptoms less severe.  You Must read complete instructions/literature along with all the possible adverse reactions/side effects for all the Medicines you take and that have been prescribed to you. Take any new Medicines after you have completely understood and accpet all the possible adverse reactions/side effects.   Please note  You were cared for by a hospitalist during your hospital stay. If you have any questions about your discharge medications or the care you received while you were in the hospital after you are discharged, you can call the unit and asked to speak with the hospitalist on call if the hospitalist that took care of you is not available. Once you are discharged, your primary care physician will handle any further medical issues. Please note that NO REFILLS for any discharge medications will be authorized once you are discharged, as it is imperative that you return to your primary care  physician (or establish a relationship with a primary care physician if you do not have one) for your aftercare needs so that they can reassess your need for medications and monitor your lab values.    On the day of Discharge:  VITAL SIGNS:   Blood pressure 134/74, pulse 87, temperature 97.9 F (36.6 C), temperature source Oral, resp. rate 18, height 5\' 5"  (1.651 m), weight 93.4 kg, SpO2 100 %.  PHYSICAL EXAMINATION:    GENERAL:  78 y.o.-year-old patient lying in the bed with no acute distress.  EYES: Pupils equal, round, reactive to light and accommodation. No scleral icterus. Extraocular muscles intact.  HEENT: Head atraumatic, normocephalic. Oropharynx and nasopharynx clear.  NECK:  Supple, no jugular venous distention. No thyroid enlargement, no tenderness.  LUNGS: Normal breath sounds bilaterally, occasional scattered rhonchi, no wheezing, rales or crepitation. No use of decreased muscles of respiration.  Decreased bibasilar breath sounds. CARDIOVASCULAR: S1, S2 normal. No  rubs, or gallops.  3/6 systolic murmur is present ABDOMEN: Soft, non-tender, non-distended. Bowel sounds present. No organomegaly or mass.  EXTREMITIES: No pedal edema, cyanosis, or clubbing.  NEUROLOGIC: Cranial nerves II through XII are intact. Muscle strength 5/5 in all extremities. Sensation intact. Gait not checked.  PSYCHIATRIC: The patient is alert and oriented x 3.  SKIN: No obvious rash, lesion, or ulcer.   DATA REVIEW:   CBC Recent Labs  Lab 11/11/18 0533  WBC 9.6  HGB 9.1*  HCT 32.3*  PLT 342    Chemistries  Recent Labs  Lab 11/08/18 2200  11/11/18 0533  NA  --    < > 141  K  --    < > 3.8  CL  --    < > 103  CO2  --    < > 30  GLUCOSE  --    < > 121*  BUN  --    < > 31*  CREATININE  --    < > 2.25*  CALCIUM  --    < > 8.7*  MG 1.3*  --   --   AST 18  --   --   ALT 19  --   --   ALKPHOS 64  --   --   BILITOT 0.2*  --   --    < > = values in this interval not displayed.      Microbiology Results  Results for orders placed or performed during the hospital  encounter of 11/08/18  Novel Coronavirus, NAA (Hosp order, Send-out to Ref Lab; TAT 18-24 hrs     Status: None   Collection Time: 11/08/18  1:07 PM   Specimen: Nasopharyngeal Swab; Respiratory  Result Value Ref Range Status   SARS-CoV-2, NAA NOT DETECTED NOT DETECTED Final    Comment: (NOTE) This test was developed and its performance characteristics determined by [employer]. This test has not been FDA cleared or approved. This test has been authorized by FDA under an Emergency Use Authorization (EUA). This test is only authorized for the duration of time the declaration that circumstances exist justifying the authorization of the emergency use of in vitro diagnostic tests for detection of SARS-CoV-2 virus and/or diagnosis of COVID-19 infection under section 564(b)(1) of the Act, 21 U.S.C. 161WRU-0(A)(5), unless the authorization is terminated or revoked sooner. When diagnostic testing is negative, the possibility of a false negative result should be considered in the context of a patient's recent exposures and the presence of clinical signs and symptoms consistent with COVID-19. An individual without symptoms of COVID-19 and who is not shedding SARS-CoV-2 virus would expect to have a negative (not detected) result in this assay. Performed  At: [facility] [place], [place] [id number] Rush Farmer MD NW:[id number]    [place]  Final    Comment: Performed at [facility], [address] [place], [place] [address]    RADIOLOGY:  No results found.   Management plans discussed with the patient, family and they are in agreement.  CODE STATUS:     Code Status Orders  (From admission, onward)         Start     Ordered   11/08/18 1558  Do not attempt resuscitation (DNR)  Continuous    Question Answer Comment  In the  event of cardiac or respiratory ARREST Do not call a "code blue"   In the event of cardiac or respiratory ARREST Do not perform Intubation, CPR, defibrillation or ACLS   In the event of cardiac or respiratory ARREST Use medication by any route, position, wound care, and other measures to relive pain and suffering. May use oxygen, suction and manual treatment of airway obstruction as needed for comfort.      11/08/18 1557        Code Status History    Date Active Date Inactive Code Status Order ID Comments User Context   02/10/2018 2149 02/12/2018 2141 Full Code [id number]  Demetrios Loll, MD Inpatient   01/04/2018 1347 01/10/2018 1449 Full Code [id number]  Hillary Bow, MD ED   01/07/2017 1624 01/08/2017 1240 DNR [id number]  Asencion Gowda, NP Inpatient   01/06/2017 1838 01/07/2017 1624 Partial Code [id number]  Vernell Morgans, RN Inpatient   01/06/2017 1548 01/06/2017 1838 Full Code [id number]  Bettey Costa, MD Inpatient   01/06/2017 1233 01/06/2017 1548 DNR [id number]  Bettey Costa, MD Inpatient   11/26/2016 1003 11/27/2016 1545 DNR [id number]  Hillary Bow, MD Inpatient   11/25/2016 2126 11/26/2016 1003 Full Code [id number]  Bettey Costa, MD Inpatient   10/25/2016 1614 10/29/2016 1725 Full Code [id number]  Henreitta Leber, MD ED   08/14/2016 2332 08/19/2016 1657 Full Code [id number]  Lance Coon, MD Inpatient   05/05/2016 0747 05/07/2016 1804 Full Code [id number]  Flora Lipps, MD ED   03/24/2016 1416 03/28/2016 1424 Full Code [id number]  Awilda Bill, NP ED   Advance Care Planning Activity      TOTAL TIME TAKING CARE OF  THIS PATIENT: 38 minutes.    Gladstone Lighter M.D on 11/11/2018 at 1:37 PM  Between 7am to 6pm - Pager - [phone]  After 6pm go to [email or web address] - Proofreader  [facility]  Office  [phone]  CC: Primary care physician; Denton Lank, MD   Note: This dictation was prepared with Dragon dictation along with smaller phrase technology.  Any transcriptional errors that result from this process are unintentional.

## 2018-11-15 ENCOUNTER — Telehealth: Payer: Self-pay | Admitting: Cardiovascular Disease

## 2018-11-15 NOTE — Telephone Encounter (Signed)
-----   Message from Arvil Chaco, [place] sent at 11/11/2018  8:52 AM EDT ----- Hello,  This patient was admitted and seen at [facility] for atypical CP that could not be ruled out as NSTEMI then treated medically as not an ideal catheterization candidate. She is stable for discharge.  We are expecting discharge today 11/11/2018.  Can you please call and arrange / schedule for follow-up TCM appointment with Dr. Ida Rogue within the next 2 weeks?  Thank you!  Signed, Arvil Chaco, PA-C 11/11/2018, 8:53 AM Pager [phone]

## 2018-11-15 NOTE — Telephone Encounter (Signed)
Attempted to call patient twice. Left a message to have patient call back

## 2018-11-16 NOTE — Progress Notes (Deleted)
   Patient ID: Mckenzie Taylor, female    DOB: [birth year], 78 y.o.   MRN: [id number]  HPI  Mr Hottenstein is a 78 y/o female with a history of  Echo report from 11/09/2018 reviewed and showed an EF of 50-55% along with a normal PA pressure of 26.2 mmHg and mild TR.  Admitted 11/08/2018 due to atypical chest pain. Cardiology consult obtained. Patient refused stress test or catheterization. Discharged after 3 days with home health.   She presents today for her initial visit with a chief complaint of   Review of Systems    Physical Exam    Assessment & Plan:  1: Chronic heart failure with preserved ejection fraction- - NYHA class - BNP 02/10/18 was 120.0  2: HTN- - BP - BMP from 11/11/2018 reviewed and showed sodium 141,potassium 3.8, creatinine 2.25 and GFR 21  3: DM- - A1c 11/08/2018 was 8.3%

## 2018-11-17 ENCOUNTER — Ambulatory Visit: Payer: Medicare Other | Admitting: Family

## 2018-11-17 ENCOUNTER — Telehealth: Payer: Self-pay | Admitting: Family

## 2018-11-17 NOTE — Telephone Encounter (Signed)
Patient did not show for her initial HF Clinic appointment on 11/17/2018

## 2018-11-22 NOTE — Telephone Encounter (Signed)
lmov to schedule  °

## 2018-11-23 NOTE — Telephone Encounter (Signed)
No ans no vm   °

## 2018-12-27 NOTE — Telephone Encounter (Signed)
3 attempts to schedule tcm. Unable to reach.  Closing encounter.

## 2019-01-01 IMAGING — DX DG CHEST 1V
1 series · 1 of 1 positions shown · non-contrast
Comparison: 03/25/2016.

CLINICAL DATA: COPD.

EXAM:
CHEST 1 VIEW

[chest ap]
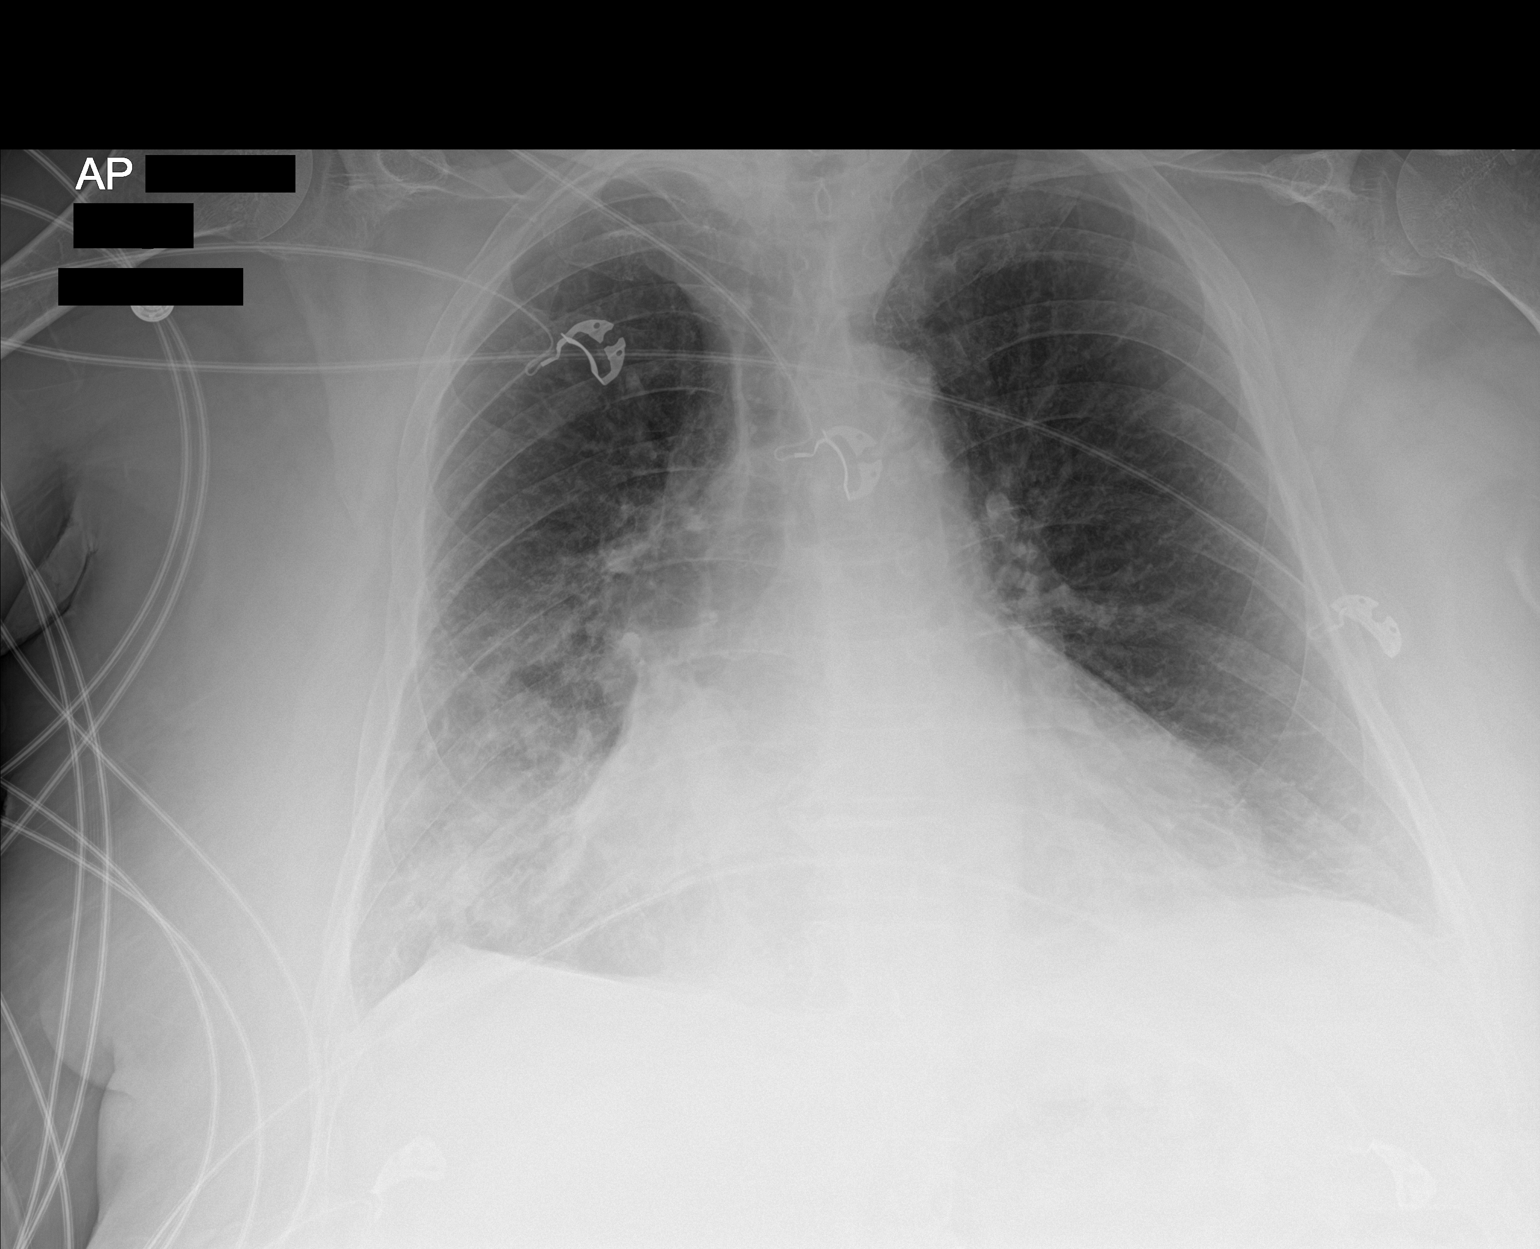

[1 of 1 positions shown; findings below may reference images not displayed]

FINDINGS: Mediastinum and hilar structures normal. Cardiomegaly with normal
pulmonary vascularity. Right lower lobe infiltrate consistent
pneumonia. Mild basilar atelectasis. No prominent pleural effusion.
No pneumothorax .
IMPRESSION: 1.  Prominent right base infiltrate consistent pneumonia.

2.  Low lung volumes with basilar atelectasis.

3. Cardiomegaly.  No pulmonary venous congestion.

## 2020-08-01 ENCOUNTER — Ambulatory Visit: Payer: Medicare Other | Admitting: Internal Medicine

## 2020-08-01 NOTE — Progress Notes (Deleted)
New Outpatient Visit Date: 08/01/2020  Referring Provider: Denton Lank, MD [address] [place],  [place] [address]  Chief Complaint: ***  HPI:  Mckenzie Taylor is a 80 y.o. female who is being seen today for the evaluation of shortness of breath at the request of Denton Lank, MD. She has a history of ***.  She was followed many years ago in our practice by Darlina Guys (most recently seen in 2010).  She was evaluated by our practice during a hospitalization in 2020 for atypical chest pain with elevated troponin felt most consistent with demand ischemia in the setting of volume overload and COPD exacerbation.  --------------------------------------------------------------------------------------------------  Cardiovascular History & Procedures: Cardiovascular Problems:  ***  Risk Factors:  ***  Cath/PCI:  ***  CV Surgery:  ***  EP Procedures and Devices:  ***  Non-Invasive Evaluation(s):  ***  Recent CV Pertinent Labs: Lab Results  Component Value Date   CHOL 192 11/09/2018   HDL 31 (L) 11/09/2018   LDLCALC UNABLE TO CALCULATE IF TRIGLYCERIDE OVER 400 mg/dL 11/09/2018   LDLDIRECT 99.3 (H) 11/09/2018   TRIG 465 (H) 11/09/2018   CHOLHDL 6.2 11/09/2018   INR 1.0 11/08/2018   BNP 120.0 (H) 02/10/2018   K 3.8 11/11/2018   K 3.6 11/15/2013   MG 1.3 (L) 11/08/2018   BUN 31 (H) 11/11/2018   BUN 17 10/22/2012   CREATININE 2.25 (H) 11/11/2018   CREATININE 1.52 (H) 10/22/2012    --------------------------------------------------------------------------------------------------  Past Medical History:  Diagnosis Date  . Anemia of chronic disease    Baseline hgb 8.0-8.9  . Autosomal recessive polycystic kidneys   . Avascular necrosis of hip (HCC)    bilateral  . CAD (coronary artery disease) 2004   2004 LHC with mild and nonobstructive CAD: 20% mid LAD stenosis  . Chronic combined systolic and diastolic heart failure (HCC)    HFrEF, EF 45% last  echo, LVH, diastolic dysfunction 6789  . Chronic kidney disease    PCKD, CKD, adrenal adenoma, cyst  . COPD (chronic obstructive pulmonary disease) ([place])    3L home oxygen  . Diabetes type 2, controlled ([place])   . Former heavy tobacco smoker    1 pk / day. Estimates quit ~ 2015  . GERD (gastroesophageal reflux disease)   . HTN (hypertension)   . Hyperlipidemia with target LDL less than 70   . Hypothyroidism    subclinical. low TSH, normal thyroid panel. biopsy 2010  . Vitamin B12 deficiency     Past Surgical History:  Procedure Laterality Date  . CHOLECYSTECTOMY    . hip replacement     bilateral-secondary to avascular necrosis  . right eye lens replacement    . ULNAR NERVE REPAIR     bilateral  . VESICOVAGINAL FISTULA CLOSURE W/ TAH      No outpatient medications have been marked as taking for the 08/01/20 encounter (Appointment) with Chosen Garron, Harrell Gave, MD.    Allergies: Other  Social History   Tobacco Use  . Smoking status: Former Smoker    Packs/day: 1.00    Years: 50.00    Pack years: 50.00  . Smokeless tobacco: Never Used  . Tobacco comment: 1 ppd - 50 years   Substance Use Topics  . Alcohol use: No  . Drug use: No    Family History  Problem Relation Age of Onset  . Cancer Father        lung  . COPD Mother   . Heart disease Mother   .  Heart disease Maternal Uncle     Review of Systems: A 12-system review of systems was performed and was negative except as noted in the HPI.  --------------------------------------------------------------------------------------------------  Physical Exam: There were no vitals taken for this visit.  General:  *** HEENT: No conjunctival pallor or scleral icterus. Facemask in place. Neck: Supple without lymphadenopathy, thyromegaly, JVD, or HJR. No carotid bruit. Lungs: Normal work of breathing. Clear to auscultation bilaterally without wheezes or crackles. Heart: Regular rate and rhythm without murmurs, rubs, or  gallops. Non-displaced PMI. Abd: Bowel sounds present. Soft, NT/ND without hepatosplenomegaly Ext: No lower extremity edema. Radial, PT, and DP pulses are 2+ bilaterally Skin: Warm and dry without rash. Neuro: CNIII-XII intact. Strength and fine-touch sensation intact in upper and lower extremities bilaterally. Psych: Normal mood and affect.  EKG:  ***  Lab Results  Component Value Date   WBC 9.6 11/11/2018   HGB 9.1 (L) 11/11/2018   HCT 32.3 (L) 11/11/2018   MCV 91.2 11/11/2018   PLT 342 11/11/2018    Lab Results  Component Value Date   NA 141 11/11/2018   K 3.8 11/11/2018   CL 103 11/11/2018   CO2 30 11/11/2018   BUN 31 (H) 11/11/2018   CREATININE 2.25 (H) 11/11/2018   GLUCOSE 121 (H) 11/11/2018   ALT 19 11/08/2018    Lab Results  Component Value Date   CHOL 192 11/09/2018   HDL 31 (L) 11/09/2018   LDLCALC UNABLE TO CALCULATE IF TRIGLYCERIDE OVER 400 mg/dL 11/09/2018   LDLDIRECT 99.3 (H) 11/09/2018   TRIG 465 (H) 11/09/2018   CHOLHDL 6.2 11/09/2018     --------------------------------------------------------------------------------------------------  ASSESSMENT AND PLAN: Nelva Bush, MD 08/01/2020 8:06 AM

## 2020-08-22 ENCOUNTER — Emergency Department
Admission: EM | Admit: 2020-08-22 | Discharge: 2020-08-23 | Disposition: A | Discharge status: Home / Self Care | Payer: Medicare Other | Attending: Emergency Medicine | Admitting: Emergency Medicine

## 2020-08-22 ENCOUNTER — Other Ambulatory Visit: Payer: Self-pay

## 2020-08-22 ENCOUNTER — Emergency Department: Payer: Medicare Other

## 2020-08-22 DIAGNOSIS — J441 Chronic obstructive pulmonary disease with (acute) exacerbation: Secondary | ICD-10-CM | POA: Insufficient documentation

## 2020-08-22 DIAGNOSIS — I5043 Acute on chronic combined systolic (congestive) and diastolic (congestive) heart failure: Secondary | ICD-10-CM | POA: Diagnosis not present

## 2020-08-22 DIAGNOSIS — R0789 Other chest pain: Secondary | ICD-10-CM | POA: Diagnosis present

## 2020-08-22 DIAGNOSIS — Z96643 Presence of artificial hip joint, bilateral: Secondary | ICD-10-CM | POA: Diagnosis not present

## 2020-08-22 DIAGNOSIS — I130 Hypertensive heart and chronic kidney disease with heart failure and stage 1 through stage 4 chronic kidney disease, or unspecified chronic kidney disease: Secondary | ICD-10-CM | POA: Diagnosis not present

## 2020-08-22 DIAGNOSIS — Z87891 Personal history of nicotine dependence: Secondary | ICD-10-CM | POA: Insufficient documentation

## 2020-08-22 DIAGNOSIS — Z79899 Other long term (current) drug therapy: Secondary | ICD-10-CM | POA: Diagnosis not present

## 2020-08-22 DIAGNOSIS — E1122 Type 2 diabetes mellitus with diabetic chronic kidney disease: Secondary | ICD-10-CM | POA: Diagnosis not present

## 2020-08-22 DIAGNOSIS — E039 Hypothyroidism, unspecified: Secondary | ICD-10-CM | POA: Insufficient documentation

## 2020-08-22 DIAGNOSIS — Z7982 Long term (current) use of aspirin: Secondary | ICD-10-CM | POA: Insufficient documentation

## 2020-08-22 DIAGNOSIS — I2510 Atherosclerotic heart disease of native coronary artery without angina pectoris: Secondary | ICD-10-CM | POA: Diagnosis not present

## 2020-08-22 DIAGNOSIS — N189 Chronic kidney disease, unspecified: Secondary | ICD-10-CM | POA: Insufficient documentation

## 2020-08-22 LAB — TROPONIN I (HIGH SENSITIVITY): Troponin I (High Sensitivity): 53 ng/L — ABNORMAL HIGH (ref <18)

## 2020-08-22 LAB — BASIC METABOLIC PANEL
Anion gap: 8 (ref 5–15)
BUN: 17 mg/dL (ref 8–23)
CO2: 23 mmol/L (ref 22–32)
Calcium: 8.2 mg/dL — ABNORMAL LOW (ref 8.9–10.3)
Chloride: 105 mmol/L (ref 98–111)
Creatinine, Ser: 1.7 mg/dL — ABNORMAL HIGH (ref 0.44–1.00)
GFR, Estimated: 31 mL/min — ABNORMAL LOW (ref >60)
Glucose, Bld: 343 mg/dL — ABNORMAL HIGH (ref 70–99)
Potassium: 4.3 mmol/L (ref 3.5–5.1)
Sodium: 136 mmol/L (ref 135–145)

## 2020-08-22 LAB — CBC
HCT: 32.5 % — ABNORMAL LOW (ref 36.0–46.0)
Hemoglobin: 9.9 g/dL — ABNORMAL LOW (ref 12.0–15.0)
MCH: 26.8 pg (ref 26.0–34.0)
MCHC: 30.5 g/dL (ref 30.0–36.0)
MCV: 87.8 fL (ref 80.0–100.0)
Platelets: 343 10*3/uL (ref 150–400)
RBC: 3.7 MIL/uL — ABNORMAL LOW (ref 3.87–5.11)
RDW: 16.6 % — ABNORMAL HIGH (ref 11.5–15.5)
WBC: 11.9 10*3/uL — ABNORMAL HIGH (ref 4.0–10.5)
nRBC: 0 % (ref 0.0–0.2)

## 2020-08-22 MED ORDER — ALUM & MAG HYDROXIDE-SIMETH 200-200-20 MG/5ML PO SUSP
15.0000 mL | Freq: Once | ORAL | Status: AC
  Administered 2020-08-22: 15 mL via ORAL
  Filled 2020-08-22: qty 30

## 2020-08-22 MED ORDER — SODIUM CHLORIDE 0.9 % IV BOLUS
500.0000 mL | Freq: Once | INTRAVENOUS | Status: AC
  Administered 2020-08-22: 500 mL via INTRAVENOUS

## 2020-08-22 MED ORDER — FAMOTIDINE IN NACL 20-0.9 MG/50ML-% IV SOLN
20.0000 mg | Freq: Once | INTRAVENOUS | Status: AC
  Administered 2020-08-22: 20 mg via INTRAVENOUS
  Filled 2020-08-22: qty 50

## 2020-08-22 NOTE — ED Notes (Signed)
Urine sent

## 2020-08-22 NOTE — ED Notes (Signed)
Per ems pt from home with chest pain for three days. Vitals per ems: 134/66, 127 hr, 12 "unremarkable per ems", 2 ntg given, 324mg  asa given per ems.

## 2020-08-22 NOTE — ED Provider Notes (Signed)
[facility] Emergency Department Provider Note ____________________________________________   Event Date/Time   First MD Initiated Contact with Patient 08/22/20 2117     (approximate)  I have reviewed the triage vital signs and the nursing notes.   HISTORY  Chief Complaint Chest Pain    HPI Mckenzie Taylor is a 80 y.o. female with PMH as noted below including COPD on 3L O2, CHF, CAD, CKD and DM who presents with chest pain over the last several days, gradual onset, constant, substernal in location and squeezing in nature.  It is worse when she lays flat.  She states it feels like indigestion but she has taken omeprazole without relief.  She denies any increased shortness of breath, cough, fever, or leg swelling.    Past Medical History:  Diagnosis Date   Anemia of chronic disease    Baseline hgb 8.0-8.9   Autosomal recessive polycystic kidneys    Avascular necrosis of hip ([place])    bilateral   CAD (coronary artery disease) 2004   2004 LHC with mild and nonobstructive CAD: 20% mid LAD stenosis   Chronic combined systolic and diastolic heart failure (HCC)    HFrEF, EF 45% last echo, LVH, diastolic dysfunction 1191   Chronic kidney disease    PCKD, CKD, adrenal adenoma, cyst   COPD (chronic obstructive pulmonary disease) ([place])    3L home oxygen   Diabetes type 2, controlled ([place])    Former heavy tobacco smoker    1 pk / day. Estimates quit ~ 2015   GERD (gastroesophageal reflux disease)    HTN (hypertension)    Hyperlipidemia with target LDL less than 70    Hypothyroidism    subclinical. low TSH, normal thyroid panel. biopsy 2010   Vitamin B12 deficiency     Patient Active Problem List   Diagnosis Date Noted   Demand ischemia ([place]) 11/10/2018   Acute on chronic combined systolic and diastolic CHF (congestive heart failure) ([place]) 11/10/2018   Chest pain 11/08/2018   Cellulitis of right leg 02/10/2018   COPD exacerbation ([place]) 01/04/2018    CHF (congestive heart failure) ([place]) 01/06/2017   COPD (chronic obstructive pulmonary disease) ([place]) 11/25/2016   Nausea and vomiting 10/29/2016   Acute on chronic congestive heart failure ([place])    Acute pulmonary edema ([place])    Acute respiratory failure with hypoxia ([place]) 10/25/2016   COPD with acute exacerbation ([place]) 08/14/2016   CAP (community acquired pneumonia) 47/82/9562   Acute systolic CHF (congestive heart failure) ([place]) 08/14/2016   Accelerated hypertension 08/14/2016   GERD (gastroesophageal reflux disease) 08/14/2016   Diabetes ([place]) 08/14/2016   Respiratory distress 05/05/2016   Acute respiratory failure ([place]) 03/24/2016   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 Authorizing Provider  albuterol (VENTOLIN HFA) 108 (90 BASE) MCG/ACT inhaler Inhale 2 puffs into the lungs every 4 (four) hours as needed.     [provider]  amLODipine (NORVASC) 10 MG  tablet Take 10 mg by mouth daily.      [provider]  aspirin EC 81 MG EC tablet Take 1 tablet (81 mg total) by mouth daily. 11/11/18   Gladstone Lighter, MD  atorvastatin (LIPITOR) 40 MG tablet Take 40 mg by mouth every evening.     [provider]  busPIRone (BUSPAR) 5 MG tablet Take 5 mg by mouth 2 (two) times daily.    [provider]  carvedilol (COREG) 6.25 MG tablet Take 1 tablet (6.25 mg total) by mouth 2 (two) times daily with a meal. 11/11/18   Gladstone Lighter, MD   Cholecalciferol (VITAMIN D3) 5000 units CAPS Take 1 capsule by mouth daily.    [provider]  Cyanocobalamin 1500 MCG TBDP Take 3 tablets by mouth daily.    [provider]  FLUoxetine (PROZAC) 40 MG capsule Take 40 mg by mouth every evening.     [provider]  Fluticasone-Umeclidin-Vilant (TRELEGY ELLIPTA) 100-62.5-25 MCG/INH AEPB Inhale 1 puff into the lungs daily.    [provider]  HUMALOG KWIKPEN 100 UNIT/ML KwikPen Inject 0-22 Units into the skin 3 (three) times daily with meals. Sliding scale 10/26/18   [provider]  ipratropium-albuterol (DUONEB) 0.5-2.5 (3) MG/3ML SOLN Take 3 mLs by nebulization 4 (four) times daily. 03/28/16   Vaughan Basta, MD  LEVEMIR FLEXTOUCH 100 UNIT/ML Pen Inject 43 Units into the skin at bedtime.     [provider]  metolazone (ZAROXOLYN) 2.5 MG tablet Take 1 tablet (2.5 mg total) by mouth daily as needed (for weight gain > 3 pounds in 1 day). 11/11/18   Gladstone Lighter, MD  omeprazole (PRILOSEC) 20 MG capsule Take 20 mg by mouth daily.    [provider]  oxyCODONE-acetaminophen (PERCOCET) 10-325 MG tablet Take 1 tablet by mouth 3 (three) times daily as needed for pain.  12/08/17   [provider]  promethazine (PHENERGAN) 25 MG tablet Take 25 mg by mouth every 6 (six) hours as needed for nausea.     [provider]  tiZANidine (ZANAFLEX) 4 MG tablet Take 4 mg by mouth 3 (three) times daily as needed for muscle spasms.     [provider]  torsemide (DEMADEX) 20 MG tablet Take 1 tablet (20 mg total) by mouth daily. 11/12/18   Gladstone Lighter, MD  traZODone (DESYREL) 50 MG tablet Take 50 mg by mouth at bedtime. 08/10/16   [provider]    Allergies Other  Family History  Problem Relation Age of Onset   Cancer Father        lung   COPD Mother    Heart disease Mother    Heart disease Maternal Uncle     Social History Social History    Tobacco Use   Smoking status: Former    Packs/day: 1.00    Years: 50.00    Pack years: 50.00    Types: Cigarettes   Smokeless tobacco: Never   Tobacco comments:    1 ppd - 50 years   Substance Use Topics   Alcohol use: No   Drug use: No    Review of Systems  Constitutional: No fever/chills Eyes: No visual changes. ENT: No sore throat. Cardiovascular: Positive for chest pain. Respiratory: Denies acute shortness of breath. Gastrointestinal: No nausea, no vomiting.  No diarrhea.  Genitourinary: Negative for dysuria.  Musculoskeletal: Negative for back pain. Skin: Negative for rash. Neurological: Negative for headaches, focal weakness or numbness.   ____________________________________________   PHYSICAL  EXAM:  VITAL SIGNS: ED Triage Vitals  Enc Vitals Group     BP 08/22/20 2108 123/74     Pulse Rate 08/22/20 2108 (!) 126     Resp 08/22/20 2108 (!) 24     Temp 08/22/20 2108 98.6 F (37 C)     Temp Source 08/22/20 2108 Oral     SpO2 08/22/20 2108 96 %     Weight 08/22/20 2111 201 lb (91.2 kg)     Height 08/22/20 2111 5\' 5"  (1.651 m)     Head Circumference --      Peak Flow --      Pain Score 08/22/20 2110 4     Pain Loc --      Pain Edu? --      Excl. in [place]? --     Constitutional: Alert and oriented. Relatively well appearing and in no acute distress. Eyes: Conjunctivae are normal.  Head: Atraumatic. Nose: No congestion/rhinnorhea. Mouth/Throat: Mucous membranes are moist.   Neck: Normal range of motion.  Cardiovascular: Tachycardic, regular rhythm. Grossly normal heart sounds.  Good peripheral circulation. Respiratory: Normal respiratory effort.  No retractions. Lungs CTAB. Gastrointestinal: No distention.  Musculoskeletal: No lower extremity edema.  Extremities warm and well perfused.  Neurologic:  Normal speech and language. No gross focal neurologic deficits are appreciated.  Skin:  Skin is warm and dry. No rash noted. Psychiatric: Mood and affect  are normal. Speech and behavior are normal.  ____________________________________________   LABS (all labs ordered are listed, but only abnormal results are displayed)  Labs Reviewed  BASIC METABOLIC PANEL - Abnormal; Notable for the following components:      Result Value   Glucose, Bld 343 (*)    Creatinine, Ser 1.70 (*)    Calcium 8.2 (*)    GFR, Estimated 31 (*)    All other components within normal limits  CBC - Abnormal; Notable for the following components:   WBC 11.9 (*)    RBC 3.70 (*)    Hemoglobin 9.9 (*)    HCT 32.5 (*)    RDW 16.6 (*)    All other components within normal limits  BRAIN NATRIURETIC PEPTIDE - Abnormal; Notable for the following components:   B Natriuretic Peptide 125.3 (*)    All other components within normal limits  LACTIC ACID, PLASMA - Abnormal; Notable for the following components:   Lactic Acid, Venous 2.0 (*)    All other components within normal limits  TROPONIN I (HIGH SENSITIVITY) - Abnormal; Notable for the following components:   Troponin I (High Sensitivity) 53 (*)    All other components within normal limits  TROPONIN I (HIGH SENSITIVITY) - Abnormal; Notable for the following components:   Troponin I (High Sensitivity) 60 (*)    All other components within normal limits  LACTIC ACID, PLASMA  CBG MONITORING, ED   ____________________________________________  EKG  ED ECG REPORT I, Arta Silence, the attending physician, personally viewed and interpreted this ECG.  Date: 08/23/2020 EKG Time: 2122 Rate: 117 Rhythm: Sinus tachycardia QRS Axis: normal Intervals: normal ST/T Wave abnormalities: normal Narrative Interpretation: no evidence of acute ischemia  ____________________________________________  RADIOLOGY  Chest X-ray interpreted by me shows no focal consolidation or edema  ____________________________________________   PROCEDURES  Procedure(s) performed: No  Procedures  Critical Care performed:  No ____________________________________________   INITIAL IMPRESSION / ASSESSMENT AND PLAN / ED COURSE  Pertinent labs & imaging results that were available during my care of the patient were reviewed by  me and considered in my medical decision making (see chart for details).   81 year old female with a PMH of CAD, COPD on 3L O2, CHF, GERD, and other PMH as noted above presents with somewhat atypical, positional chest pain over the last several days that she ascribes to indigestion.    I reviewed the past records in Epic; the patient was most recently admitted for NSTEMI in 2020.  She declined stress test or cardiac cath at that time.  I do not see any records of recent cardiology followup.  On exam she is overall well appearing.  Her vital signs are normal except for tachycardia around 110-115 which she states is close to her baseline (and I confirmed she was tachycardic on previous visits to the low 100s).  Lungs are clear.  There is no peripheral edema.  Differential includes GERD, gastritis, musculoskeletal pain, ACS, COPD exacerbation.  Although the patient is tachycardic she denies any increase shortness of breath and is not requiring increased oxygen.  She has no DVT symptoms.  I do not suspect PE.  I have a low suspicion for vascular etiology.    We will obtain basic labs, troponins x2, chest XR, and reassess. In the meantime I will give pepcid and maalox.    ----------------------------------------- 12:18 AM on 08/23/2020 -----------------------------------------   Patient is pending repeat troponin.  BNP and lactate ordered previously were also just sent.  Her chest pain has resolved after the GI meds, which supports GERD as the most likely etiology.  She feels well and wants to go home.  I have signed her out to the oncoming ED physician Dr. Tamala Julian.   ____________________________________________   FINAL CLINICAL IMPRESSION(S) / ED DIAGNOSES  Final diagnoses:  Atypical chest  pain      NEW MEDICATIONS STARTED DURING THIS VISIT:  New Prescriptions   No medications on file     Note:  This document was prepared using Dragon voice recognition software and may include unintentional dictation errors.    Arta Silence, MD 08/23/20 [phone]

## 2020-08-22 NOTE — Discharge Instructions (Signed)
Continue take your omeprazole as prescribed.  Return to the ER for new, worsening, or persistent severe chest pain, difficulty breathing, nausea or vomiting, weakness or lightheadedness, or any other new or worsening symptoms that concern you.

## 2020-08-22 NOTE — ED Triage Notes (Signed)
Pt reports history of CHF, COPD reports for the past 3 days she had chest pain but today pain increased and having shortness of breath. Pt uses oxygen 3L/[place] at all times.

## 2020-08-23 LAB — BRAIN NATRIURETIC PEPTIDE: B Natriuretic Peptide: 125.3 pg/mL — ABNORMAL HIGH (ref 0.0–100.0)

## 2020-08-23 LAB — TROPONIN I (HIGH SENSITIVITY): Troponin I (High Sensitivity): 60 ng/L — ABNORMAL HIGH (ref <18)

## 2020-08-23 LAB — LACTIC ACID, PLASMA: Lactic Acid, Venous: 2 mmol/L (ref 0.5–1.9)

## 2020-08-23 NOTE — ED Notes (Signed)
Dr. Tamala Julian notified lactic acid 2.0

## 2020-08-23 NOTE — ED Provider Notes (Signed)
Patient received in signout from Dr. Cherylann Banas pending second troponin.  Second troponin is essentially flat.  I go to the bedside and reassessed the patient, she is sitting up on the side of the bed on her home O2 and requesting discharge.  She reports that she feels fine, has no chest pain and feels much better after receiving IV famotidine.  We discussed no signs of significant acute cardiac pathology.  We discussed not quite normal other blood work, but she reports that she feels fine and wants to go home.  We discussed return precautions for the ED and patient stable for outpatient management, per original plan of care.   Vladimir Crofts, MD 08/23/20 [phone]

## 2020-09-16 ENCOUNTER — Observation Stay: Payer: Medicare Other

## 2020-09-16 ENCOUNTER — Emergency Department: Payer: Medicare Other

## 2020-09-16 ENCOUNTER — Other Ambulatory Visit: Payer: Self-pay

## 2020-09-16 ENCOUNTER — Encounter: Payer: Self-pay | Admitting: Internal Medicine

## 2020-09-16 ENCOUNTER — Inpatient Hospital Stay
Admission: EM | Admit: 2020-09-16 | Discharge: 2020-09-18 | DRG: 281 | Disposition: A | Discharge status: Home / Self Care | Payer: Medicare Other | Attending: Hospitalist | Admitting: Hospitalist

## 2020-09-16 ENCOUNTER — Observation Stay (HOSPITAL_BASED_OUTPATIENT_CLINIC_OR_DEPARTMENT_OTHER)
Admit: 2020-09-16 | Discharge: 2020-09-16 | Disposition: A | Discharge status: Home / Self Care | Payer: Medicare Other | Attending: Internal Medicine | Admitting: Internal Medicine

## 2020-09-16 DIAGNOSIS — I5042 Chronic combined systolic (congestive) and diastolic (congestive) heart failure: Secondary | ICD-10-CM | POA: Diagnosis present

## 2020-09-16 DIAGNOSIS — E1129 Type 2 diabetes mellitus with other diabetic kidney complication: Secondary | ICD-10-CM | POA: Diagnosis present

## 2020-09-16 DIAGNOSIS — J449 Chronic obstructive pulmonary disease, unspecified: Secondary | ICD-10-CM | POA: Diagnosis present

## 2020-09-16 DIAGNOSIS — I10 Essential (primary) hypertension: Secondary | ICD-10-CM

## 2020-09-16 DIAGNOSIS — I5021 Acute systolic (congestive) heart failure: Secondary | ICD-10-CM

## 2020-09-16 DIAGNOSIS — Z96643 Presence of artificial hip joint, bilateral: Secondary | ICD-10-CM | POA: Diagnosis present

## 2020-09-16 DIAGNOSIS — Z7982 Long term (current) use of aspirin: Secondary | ICD-10-CM

## 2020-09-16 DIAGNOSIS — E875 Hyperkalemia: Secondary | ICD-10-CM | POA: Diagnosis present

## 2020-09-16 DIAGNOSIS — Z825 Family history of asthma and other chronic lower respiratory diseases: Secondary | ICD-10-CM

## 2020-09-16 DIAGNOSIS — E039 Hypothyroidism, unspecified: Secondary | ICD-10-CM | POA: Diagnosis present

## 2020-09-16 DIAGNOSIS — I252 Old myocardial infarction: Secondary | ICD-10-CM

## 2020-09-16 DIAGNOSIS — I5032 Chronic diastolic (congestive) heart failure: Secondary | ICD-10-CM | POA: Diagnosis not present

## 2020-09-16 DIAGNOSIS — E1122 Type 2 diabetes mellitus with diabetic chronic kidney disease: Secondary | ICD-10-CM | POA: Diagnosis present

## 2020-09-16 DIAGNOSIS — J9611 Chronic respiratory failure with hypoxia: Secondary | ICD-10-CM | POA: Diagnosis present

## 2020-09-16 DIAGNOSIS — Z713 Dietary counseling and surveillance: Secondary | ICD-10-CM

## 2020-09-16 DIAGNOSIS — E538 Deficiency of other specified B group vitamins: Secondary | ICD-10-CM | POA: Diagnosis present

## 2020-09-16 DIAGNOSIS — N184 Chronic kidney disease, stage 4 (severe): Secondary | ICD-10-CM | POA: Diagnosis not present

## 2020-09-16 DIAGNOSIS — R079 Chest pain, unspecified: Secondary | ICD-10-CM | POA: Diagnosis present

## 2020-09-16 DIAGNOSIS — Z79899 Other long term (current) drug therapy: Secondary | ICD-10-CM

## 2020-09-16 DIAGNOSIS — D631 Anemia in chronic kidney disease: Secondary | ICD-10-CM | POA: Diagnosis present

## 2020-09-16 DIAGNOSIS — Z66 Do not resuscitate: Secondary | ICD-10-CM | POA: Diagnosis present

## 2020-09-16 DIAGNOSIS — I213 ST elevation (STEMI) myocardial infarction of unspecified site: Secondary | ICD-10-CM | POA: Diagnosis not present

## 2020-09-16 DIAGNOSIS — E785 Hyperlipidemia, unspecified: Secondary | ICD-10-CM | POA: Diagnosis not present

## 2020-09-16 DIAGNOSIS — Z79891 Long term (current) use of opiate analgesic: Secondary | ICD-10-CM

## 2020-09-16 DIAGNOSIS — E119 Type 2 diabetes mellitus without complications: Secondary | ICD-10-CM | POA: Diagnosis present

## 2020-09-16 DIAGNOSIS — R7989 Other specified abnormal findings of blood chemistry: Secondary | ICD-10-CM | POA: Diagnosis present

## 2020-09-16 DIAGNOSIS — Z8249 Family history of ischemic heart disease and other diseases of the circulatory system: Secondary | ICD-10-CM

## 2020-09-16 DIAGNOSIS — Z9981 Dependence on supplemental oxygen: Secondary | ICD-10-CM

## 2020-09-16 DIAGNOSIS — Z20822 Contact with and (suspected) exposure to covid-19: Secondary | ICD-10-CM | POA: Diagnosis present

## 2020-09-16 DIAGNOSIS — F32A Depression, unspecified: Secondary | ICD-10-CM | POA: Insufficient documentation

## 2020-09-16 DIAGNOSIS — K219 Gastro-esophageal reflux disease without esophagitis: Secondary | ICD-10-CM | POA: Diagnosis present

## 2020-09-16 DIAGNOSIS — Z87891 Personal history of nicotine dependence: Secondary | ICD-10-CM

## 2020-09-16 DIAGNOSIS — I214 Non-ST elevation (NSTEMI) myocardial infarction: Secondary | ICD-10-CM | POA: Diagnosis not present

## 2020-09-16 DIAGNOSIS — Z91048 Other nonmedicinal substance allergy status: Secondary | ICD-10-CM

## 2020-09-16 DIAGNOSIS — Q613 Polycystic kidney, unspecified: Secondary | ICD-10-CM

## 2020-09-16 DIAGNOSIS — E669 Obesity, unspecified: Secondary | ICD-10-CM | POA: Diagnosis present

## 2020-09-16 DIAGNOSIS — E1165 Type 2 diabetes mellitus with hyperglycemia: Secondary | ICD-10-CM | POA: Diagnosis present

## 2020-09-16 DIAGNOSIS — I5020 Unspecified systolic (congestive) heart failure: Secondary | ICD-10-CM

## 2020-09-16 DIAGNOSIS — I255 Ischemic cardiomyopathy: Secondary | ICD-10-CM | POA: Diagnosis present

## 2020-09-16 DIAGNOSIS — Z9049 Acquired absence of other specified parts of digestive tract: Secondary | ICD-10-CM

## 2020-09-16 DIAGNOSIS — Z794 Long term (current) use of insulin: Secondary | ICD-10-CM

## 2020-09-16 DIAGNOSIS — Z6834 Body mass index (BMI) 34.0-34.9, adult: Secondary | ICD-10-CM

## 2020-09-16 DIAGNOSIS — I130 Hypertensive heart and chronic kidney disease with heart failure and stage 1 through stage 4 chronic kidney disease, or unspecified chronic kidney disease: Secondary | ICD-10-CM | POA: Diagnosis present

## 2020-09-16 DIAGNOSIS — I2510 Atherosclerotic heart disease of native coronary artery without angina pectoris: Secondary | ICD-10-CM | POA: Diagnosis present

## 2020-09-16 DIAGNOSIS — Z7951 Long term (current) use of inhaled steroids: Secondary | ICD-10-CM

## 2020-09-16 LAB — BASIC METABOLIC PANEL
Anion gap: 9 (ref 5–15)
BUN: 19 mg/dL (ref 8–23)
CO2: 26 mmol/L (ref 22–32)
Calcium: 9 mg/dL (ref 8.9–10.3)
Chloride: 98 mmol/L (ref 98–111)
Creatinine, Ser: 2.17 mg/dL — ABNORMAL HIGH (ref 0.44–1.00)
GFR, Estimated: 23 mL/min — ABNORMAL LOW (ref >60)
Glucose, Bld: 506 mg/dL (ref 70–99)
Potassium: 5.6 mmol/L — ABNORMAL HIGH (ref 3.5–5.1)
Sodium: 133 mmol/L — ABNORMAL LOW (ref 135–145)

## 2020-09-16 LAB — URINE DRUG SCREEN, QUALITATIVE (ARMC ONLY)
Amphetamines, Ur Screen: NOT DETECTED
Barbiturates, Ur Screen: NOT DETECTED
Benzodiazepine, Ur Scrn: NOT DETECTED
Cannabinoid 50 Ng, Ur ~~LOC~~: NOT DETECTED
Cocaine Metabolite,Ur ~~LOC~~: NOT DETECTED
MDMA (Ecstasy)Ur Screen: NOT DETECTED
Methadone Scn, Ur: NOT DETECTED
Opiate, Ur Screen: NOT DETECTED
Phencyclidine (PCP) Ur S: NOT DETECTED
Tricyclic, Ur Screen: NOT DETECTED

## 2020-09-16 LAB — TROPONIN I (HIGH SENSITIVITY)
Troponin I (High Sensitivity): 241 ng/L (ref <18)
Troponin I (High Sensitivity): 391 ng/L (ref <18)
Troponin I (High Sensitivity): 1416 ng/L (ref <18)
Troponin I (High Sensitivity): 1290 ng/L (ref <18)

## 2020-09-16 LAB — GLUCOSE, CAPILLARY: Glucose-Capillary: 188 mg/dL — ABNORMAL HIGH (ref 70–99)

## 2020-09-16 LAB — HEPATIC FUNCTION PANEL
ALT: 14 U/L (ref 0–44)
AST: 29 U/L (ref 15–41)
Albumin: 3.3 g/dL — ABNORMAL LOW (ref 3.5–5.0)
Alkaline Phosphatase: 59 U/L (ref 38–126)
Bilirubin, Direct: <0.1 mg/dL (ref 0.0–0.2)
Total Bilirubin: 0.3 mg/dL (ref 0.3–1.2)
Total Protein: 6.6 g/dL (ref 6.5–8.1)

## 2020-09-16 LAB — ECHOCARDIOGRAM COMPLETE
AR max vel: 1.55 cm2
AV Area VTI: 1.84 cm2
AV Area mean vel: 1.35 cm2
AV Mean grad: 5.7 mmHg
AV Peak grad: 10.2 mmHg
Ao pk vel: 1.6 m/s
Area-P 1/2: 11.67 cm2
Calc EF: 27.2 %
Height: 65 in
S' Lateral: 4.67 cm
Single Plane A2C EF: 35.2 %
Single Plane A4C EF: 13.4 %
Weight: 3414.48 oz

## 2020-09-16 LAB — CBC
HCT: 33.9 % — ABNORMAL LOW (ref 36.0–46.0)
Hemoglobin: 10.2 g/dL — ABNORMAL LOW (ref 12.0–15.0)
MCH: 26.8 pg (ref 26.0–34.0)
MCHC: 30.1 g/dL (ref 30.0–36.0)
MCV: 89.2 fL (ref 80.0–100.0)
Platelets: 365 10*3/uL (ref 150–400)
RBC: 3.8 MIL/uL — ABNORMAL LOW (ref 3.87–5.11)
RDW: 16.3 % — ABNORMAL HIGH (ref 11.5–15.5)
WBC: 11.6 10*3/uL — ABNORMAL HIGH (ref 4.0–10.5)
nRBC: 0 % (ref 0.0–0.2)

## 2020-09-16 LAB — RESP PANEL BY RT-PCR (FLU A&B, COVID) ARPGX2
Influenza A by PCR: NEGATIVE
Influenza B by PCR: NEGATIVE
SARS Coronavirus 2 by RT PCR: NEGATIVE

## 2020-09-16 LAB — HEPARIN LEVEL (UNFRACTIONATED): Heparin Unfractionated: <0.1 IU/mL — ABNORMAL LOW (ref 0.30–0.70)

## 2020-09-16 LAB — BRAIN NATRIURETIC PEPTIDE: B Natriuretic Peptide: 978.5 pg/mL — ABNORMAL HIGH (ref 0.0–100.0)

## 2020-09-16 LAB — CBG MONITORING, ED
Glucose-Capillary: 294 mg/dL — ABNORMAL HIGH (ref 70–99)
Glucose-Capillary: 339 mg/dL — ABNORMAL HIGH (ref 70–99)
Glucose-Capillary: 352 mg/dL — ABNORMAL HIGH (ref 70–99)
Glucose-Capillary: 304 mg/dL — ABNORMAL HIGH (ref 70–99)
Glucose-Capillary: 287 mg/dL — ABNORMAL HIGH (ref 70–99)

## 2020-09-16 LAB — D-DIMER, QUANTITATIVE: D-Dimer, Quant: 0.67 ug/mL-FEU — ABNORMAL HIGH (ref 0.00–0.50)

## 2020-09-16 LAB — MAGNESIUM: Magnesium: 2.6 mg/dL — ABNORMAL HIGH (ref 1.7–2.4)

## 2020-09-16 LAB — PROTIME-INR
INR: 1 (ref 0.8–1.2)
Prothrombin Time: 13.1 seconds (ref 11.4–15.2)

## 2020-09-16 LAB — APTT: aPTT: 27 seconds (ref 24–36)

## 2020-09-16 LAB — LIPASE, BLOOD: Lipase: 37 U/L (ref 11–51)

## 2020-09-16 MED ORDER — NITROGLYCERIN 2 % TD OINT
1.0000 [in_us] | TOPICAL_OINTMENT | Freq: Four times a day (QID) | TRANSDERMAL | Status: DC
  Administered 2020-09-16 – 2020-09-18 (×7): 1 [in_us] via TOPICAL
  Filled 2020-09-16 (×7): qty 1

## 2020-09-16 MED ORDER — CARVEDILOL 3.125 MG PO TABS: 3.1250 mg | ORAL_TABLET | Freq: Every day | ORAL | Status: DC

## 2020-09-16 MED ORDER — FENTANYL CITRATE (PF) 100 MCG/2ML IJ SOLN
50.0000 ug | Freq: Once | INTRAMUSCULAR | Status: AC
  Administered 2020-09-16: 50 ug via INTRAVENOUS
  Filled 2020-09-16: qty 2

## 2020-09-16 MED ORDER — CLOPIDOGREL BISULFATE 75 MG PO TABS
300.0000 mg | ORAL_TABLET | Freq: Once | ORAL | Status: AC
  Administered 2020-09-16: 300 mg via ORAL
  Filled 2020-09-16: qty 4

## 2020-09-16 MED ORDER — INSULIN ASPART 100 UNIT/ML IJ SOLN
10.0000 [IU] | Freq: Once | INTRAMUSCULAR | Status: AC
  Administered 2020-09-16: 10 [IU] via INTRAVENOUS
  Filled 2020-09-16: qty 1

## 2020-09-16 MED ORDER — HEPARIN BOLUS VIA INFUSION
2350.0000 [IU] | Freq: Once | INTRAVENOUS | Status: AC
  Administered 2020-09-16: 2350 [IU] via INTRAVENOUS
  Filled 2020-09-16: qty 2350

## 2020-09-16 MED ORDER — PROMETHAZINE HCL 25 MG PO TABS
12.5000 mg | ORAL_TABLET | Freq: Four times a day (QID) | ORAL | Status: DC | PRN
  Administered 2020-09-16 – 2020-09-18 (×4): 12.5 mg via ORAL
  Filled 2020-09-16 (×5): qty 1

## 2020-09-16 MED ORDER — TRAZODONE HCL 50 MG PO TABS
50.0000 mg | ORAL_TABLET | Freq: Every day | ORAL | Status: DC
  Administered 2020-09-16 – 2020-09-17 (×2): 50 mg via ORAL
  Filled 2020-09-16 (×2): qty 1

## 2020-09-16 MED ORDER — HEPARIN (PORCINE) 25000 UT/250ML-% IV SOLN
1900.0000 [IU]/h | INTRAVENOUS | Status: DC
  Administered 2020-09-16: 1000 [IU]/h via INTRAVENOUS
  Administered 2020-09-17: 1750 [IU]/h via INTRAVENOUS
  Administered 2020-09-17: 1300 [IU]/h via INTRAVENOUS
  Filled 2020-09-16 (×3): qty 250

## 2020-09-16 MED ORDER — TORSEMIDE 20 MG PO TABS
20.0000 mg | ORAL_TABLET | Freq: Every day | ORAL | Status: DC
  Administered 2020-09-16 – 2020-09-18 (×3): 20 mg via ORAL
  Filled 2020-09-16 (×3): qty 1

## 2020-09-16 MED ORDER — DM-GUAIFENESIN ER 30-600 MG PO TB12: 1.0000 | ORAL_TABLET | Freq: Two times a day (BID) | ORAL | Status: DC | PRN

## 2020-09-16 MED ORDER — ATORVASTATIN CALCIUM 20 MG PO TABS
40.0000 mg | ORAL_TABLET | Freq: Every evening | ORAL | Status: DC
  Administered 2020-09-17: 40 mg via ORAL
  Filled 2020-09-16: qty 2

## 2020-09-16 MED ORDER — UMECLIDINIUM BROMIDE 62.5 MCG/INH IN AEPB
1.0000 | INHALATION_SPRAY | Freq: Every day | RESPIRATORY_TRACT | Status: DC
  Administered 2020-09-17 – 2020-09-18 (×2): 1 via RESPIRATORY_TRACT
  Filled 2020-09-16: qty 7

## 2020-09-16 MED ORDER — IPRATROPIUM-ALBUTEROL 0.5-2.5 (3) MG/3ML IN SOLN
3.0000 mL | Freq: Four times a day (QID) | RESPIRATORY_TRACT | Status: DC
  Administered 2020-09-16 (×2): 3 mL via RESPIRATORY_TRACT
  Filled 2020-09-16: qty 3

## 2020-09-16 MED ORDER — PANTOPRAZOLE SODIUM 40 MG PO TBEC
40.0000 mg | DELAYED_RELEASE_TABLET | Freq: Every day | ORAL | Status: DC
  Administered 2020-09-16 – 2020-09-18 (×3): 40 mg via ORAL
  Filled 2020-09-16 (×3): qty 1

## 2020-09-16 MED ORDER — SODIUM ZIRCONIUM CYCLOSILICATE 10 G PO PACK
10.0000 g | PACK | Freq: Once | ORAL | Status: AC
  Administered 2020-09-16: 10 g via ORAL
  Filled 2020-09-16: qty 1

## 2020-09-16 MED ORDER — FLUTICASONE-UMECLIDIN-VILANT 100-62.5-25 MCG/INH IN AEPB: 1.0000 | INHALATION_SPRAY | Freq: Every day | RESPIRATORY_TRACT | Status: DC

## 2020-09-16 MED ORDER — TIZANIDINE HCL 4 MG PO TABS
4.0000 mg | ORAL_TABLET | Freq: Three times a day (TID) | ORAL | Status: DC | PRN
  Filled 2020-09-16: qty 1

## 2020-09-16 MED ORDER — INSULIN ASPART 100 UNIT/ML IJ SOLN
0.0000 [IU] | Freq: Every day | INTRAMUSCULAR | Status: DC
Start: 2020-09-16 — End: 2020-09-18
  Administered 2020-09-17: 4 [IU] via SUBCUTANEOUS
  Filled 2020-09-16: qty 1

## 2020-09-16 MED ORDER — ALUM & MAG HYDROXIDE-SIMETH 200-200-20 MG/5ML PO SUSP
30.0000 mL | Freq: Once | ORAL | Status: AC
  Administered 2020-09-16: 30 mL via ORAL
  Filled 2020-09-16: qty 30

## 2020-09-16 MED ORDER — IPRATROPIUM-ALBUTEROL 0.5-2.5 (3) MG/3ML IN SOLN
3.0000 mL | Freq: Three times a day (TID) | RESPIRATORY_TRACT | Status: DC
  Administered 2020-09-17 – 2020-09-18 (×4): 3 mL via RESPIRATORY_TRACT
  Filled 2020-09-16 (×4): qty 3

## 2020-09-16 MED ORDER — VITAMIN B-12 1000 MCG PO TABS
1000.0000 ug | ORAL_TABLET | Freq: Every day | ORAL | Status: DC
  Administered 2020-09-16 – 2020-09-18 (×3): 1000 ug via ORAL
  Filled 2020-09-16 (×3): qty 1

## 2020-09-16 MED ORDER — ACETAMINOPHEN 325 MG PO TABS: 650.0000 mg | ORAL_TABLET | Freq: Four times a day (QID) | ORAL | Status: DC | PRN

## 2020-09-16 MED ORDER — TECHNETIUM TO 99M ALBUMIN AGGREGATED
4.0000 | Freq: Once | INTRAVENOUS | Status: AC | PRN
  Administered 2020-09-16: 4.34 via INTRAVENOUS

## 2020-09-16 MED ORDER — NITROGLYCERIN 0.4 MG SL SUBL: 0.4000 mg | SUBLINGUAL_TABLET | SUBLINGUAL | Status: DC | PRN

## 2020-09-16 MED ORDER — OXYCODONE-ACETAMINOPHEN 5-325 MG PO TABS
1.0000 | ORAL_TABLET | Freq: Every day | ORAL | Status: DC
  Administered 2020-09-16 – 2020-09-17 (×2): 1 via ORAL
  Filled 2020-09-16 (×2): qty 1

## 2020-09-16 MED ORDER — BUSPIRONE HCL 5 MG PO TABS
5.0000 mg | ORAL_TABLET | Freq: Two times a day (BID) | ORAL | Status: DC
  Administered 2020-09-16 – 2020-09-18 (×4): 5 mg via ORAL
  Filled 2020-09-16 (×5): qty 1

## 2020-09-16 MED ORDER — INSULIN ASPART 100 UNIT/ML IJ SOLN
0.0000 [IU] | Freq: Three times a day (TID) | INTRAMUSCULAR | Status: DC
  Administered 2020-09-16: 8 [IU] via SUBCUTANEOUS
  Administered 2020-09-17: 2 [IU] via SUBCUTANEOUS
  Administered 2020-09-17: 5 [IU] via SUBCUTANEOUS
  Administered 2020-09-17: 8 [IU] via SUBCUTANEOUS
  Administered 2020-09-18: 5 [IU] via SUBCUTANEOUS
  Administered 2020-09-18: 3 [IU] via SUBCUTANEOUS
  Filled 2020-09-16 (×6): qty 1

## 2020-09-16 MED ORDER — MORPHINE SULFATE (PF) 2 MG/ML IV SOLN: 0.5000 mg | INTRAVENOUS | Status: DC | PRN

## 2020-09-16 MED ORDER — SODIUM CHLORIDE 0.9 % IV SOLN: INTRAVENOUS | Status: DC

## 2020-09-16 MED ORDER — FLUTICASONE FUROATE-VILANTEROL 100-25 MCG/INH IN AEPB
1.0000 | INHALATION_SPRAY | Freq: Every day | RESPIRATORY_TRACT | Status: DC
  Administered 2020-09-17 – 2020-09-18 (×2): 1 via RESPIRATORY_TRACT
  Filled 2020-09-16: qty 28

## 2020-09-16 MED ORDER — INSULIN DETEMIR 100 UNIT/ML ~~LOC~~ SOLN
30.0000 [IU] | Freq: Every day | SUBCUTANEOUS | Status: DC
  Administered 2020-09-17: 30 [IU] via SUBCUTANEOUS
  Filled 2020-09-16 (×3): qty 0.3

## 2020-09-16 MED ORDER — CLOPIDOGREL BISULFATE 75 MG PO TABS
75.0000 mg | ORAL_TABLET | Freq: Every day | ORAL | Status: DC
  Administered 2020-09-17 – 2020-09-18 (×2): 75 mg via ORAL
  Filled 2020-09-16 (×2): qty 1

## 2020-09-16 MED ORDER — ONDANSETRON HCL 4 MG/2ML IJ SOLN
4.0000 mg | Freq: Four times a day (QID) | INTRAMUSCULAR | Status: DC | PRN
  Administered 2020-09-16: 4 mg via INTRAVENOUS
  Filled 2020-09-16: qty 2

## 2020-09-16 MED ORDER — HEPARIN BOLUS VIA INFUSION
4000.0000 [IU] | Freq: Once | INTRAVENOUS | Status: AC
  Administered 2020-09-16: 4000 [IU] via INTRAVENOUS
  Filled 2020-09-16: qty 4000

## 2020-09-16 MED ORDER — HYDRALAZINE HCL 20 MG/ML IJ SOLN: 5.0000 mg | INTRAMUSCULAR | Status: DC | PRN

## 2020-09-16 MED ORDER — ASPIRIN EC 81 MG PO TBEC
81.0000 mg | DELAYED_RELEASE_TABLET | Freq: Every day | ORAL | Status: DC
  Administered 2020-09-17 – 2020-09-18 (×2): 81 mg via ORAL
  Filled 2020-09-16 (×2): qty 1

## 2020-09-16 MED ORDER — LIDOCAINE VISCOUS HCL 2 % MT SOLN
15.0000 mL | Freq: Once | OROMUCOSAL | Status: AC
  Administered 2020-09-16: 15 mL via ORAL
  Filled 2020-09-16: qty 15

## 2020-09-16 MED ORDER — ALBUTEROL SULFATE HFA 108 (90 BASE) MCG/ACT IN AERS
2.0000 | INHALATION_SPRAY | RESPIRATORY_TRACT | Status: DC | PRN
  Administered 2020-09-17 (×2): 2 via RESPIRATORY_TRACT
  Filled 2020-09-16 (×2): qty 6.7

## 2020-09-16 MED ORDER — VITAMIN D 25 MCG (1000 UNIT) PO TABS
5000.0000 [IU] | ORAL_TABLET | Freq: Every day | ORAL | Status: DC
  Administered 2020-09-16 – 2020-09-18 (×3): 5000 [IU] via ORAL
  Filled 2020-09-16 (×3): qty 5

## 2020-09-16 MED ORDER — ONDANSETRON HCL 4 MG/2ML IJ SOLN
4.0000 mg | Freq: Three times a day (TID) | INTRAMUSCULAR | Status: DC | PRN
  Administered 2020-09-16: 4 mg via INTRAVENOUS
  Filled 2020-09-16: qty 2

## 2020-09-16 NOTE — ED Notes (Signed)
Pt taken to [facility] for VQ scan.

## 2020-09-16 NOTE — Progress Notes (Signed)
*  PRELIMINARY RESULTS* Echocardiogram 2D Echocardiogram has been performed.  Sherrie Sport 09/16/2020, 11:11 AM

## 2020-09-16 NOTE — Consult Note (Addendum)
ANTICOAGULATION CONSULT NOTE - Initial Consult  Pharmacy Consult for heparin drip Indication: chest pain/ACS  Allergies  Allergen Reactions   Other Rash    Pt reports allergy to metals.    Patient Measurements: Height: 5\' 5"  (165.1 cm) Weight: 96.8 kg (213 lb 6.5 oz) IBW/kg (Calculated) : 57 Heparin Dosing Weight: 78.9kg  Vital Signs: Temp: 99.2 F (37.3 C) (07/05 0632) Temp Source: Oral (07/05 8527) BP: 155/98 (07/05 7824) Pulse Rate: 117 (07/05 0632)  Labs: Recent Labs    09/16/20 0642  HGB 10.2*  HCT 33.9*  PLT 365  CREATININE 2.17*  TROPONINIHS 241*    Estimated Creatinine Clearance: 24.6 mL/min (A) (by C-G formula based on SCr of 2.17 mg/dL (H)).   Medical History: Past Medical History:  Diagnosis Date   Anemia of chronic disease    Baseline hgb 8.0-8.9   Autosomal recessive polycystic kidneys    Avascular necrosis of hip ([place])    bilateral   CAD (coronary artery disease) 2004   2004 LHC with mild and nonobstructive CAD: 20% mid LAD stenosis   Chronic combined systolic and diastolic heart failure (HCC)    HFrEF, EF 45% last echo, LVH, diastolic dysfunction 2353   Chronic kidney disease    PCKD, CKD, adrenal adenoma, cyst   COPD (chronic obstructive pulmonary disease) ([place])    3L home oxygen   Diabetes type 2, controlled ([place])    Former heavy tobacco smoker    1 pk / day. Estimates quit ~ 2015   GERD (gastroesophageal reflux disease)    HTN (hypertension)    Hyperlipidemia with target LDL less than 70    Hypothyroidism    subclinical. low TSH, normal thyroid panel. biopsy 2010   Vitamin B12 deficiency     Medications:  No PTA anticoagulant of record  80 yo female with chest pain and elevated troponin.  Pharmacy has been consulted to initiate and monitor a heparin drip.   Goal of Therapy:  Heparin level 0.3-0.7 units/ml Monitor platelets by anticoagulation protocol: Yes   Plan:  Will obtain baseline INR/APTT Give 4000 units bolus x  1 Start heparin infusion at 1000 units/hr Check anti-Xa level in 8 hours and daily while on heparin Continue to monitor H&H and platelets  Lu Duffel, PharmD, BCPS Clinical Pharmacist 09/16/2020 7:50 AM

## 2020-09-16 NOTE — ED Notes (Signed)
Pt in rad 

## 2020-09-16 NOTE — Consult Note (Signed)
ANTICOAGULATION CONSULT NOTE - Initial Consult  Pharmacy Consult for heparin drip Indication: chest pain/ACS  Allergies  Allergen Reactions   Other Rash    Pt reports allergy to metals.    Patient Measurements: Height: 5\' 5"  (165.1 cm) Weight: 96.8 kg (213 lb 6.5 oz) IBW/kg (Calculated) : 57 Heparin Dosing Weight: 78.9kg  Vital Signs: Temp: 99.2 F (37.3 C) (07/05 0632) Temp Source: Oral (07/05 1610) BP: 132/67 (07/05 1810) Pulse Rate: 109 (07/05 1810)  Labs: Recent Labs    09/16/20 9604 09/16/20 0813 09/16/20 0819 09/16/20 1143 09/16/20 1447 09/16/20 1653  HGB 10.2*  --   --   --   --   --   HCT 33.9*  --   --   --   --   --   PLT 365  --   --   --   --   --   APTT  --  27  --   --   --   --   LABPROT  --  13.1  --   --   --   --   INR  --  1.0  --   --   --   --   HEPARINUNFRC  --   --   --   --   --  <0.10*  CREATININE 2.17*  --   --   --   --   --   TROPONINIHS 241*  --  391* 1,416* 1,290*  --      Estimated Creatinine Clearance: 24.6 mL/min (A) (by C-G formula based on SCr of 2.17 mg/dL (H)).   Medical History: Past Medical History:  Diagnosis Date   Anemia of chronic disease    Baseline hgb 8.0-8.9   Autosomal recessive polycystic kidneys    Avascular necrosis of hip ([place])    bilateral   CAD (coronary artery disease) 2004   2004 LHC with mild and nonobstructive CAD: 20% mid LAD stenosis   Chronic combined systolic and diastolic heart failure (HCC)    HFrEF, EF 45% last echo, LVH, diastolic dysfunction 5409   Chronic kidney disease    PCKD, CKD, adrenal adenoma, cyst   COPD (chronic obstructive pulmonary disease) ([place])    3L home oxygen   Diabetes type 2, controlled ([place])    Former heavy tobacco smoker    1 pk / day. Estimates quit ~ 2015   GERD (gastroesophageal reflux disease)    HTN (hypertension)    Hyperlipidemia with target LDL less than 70    Hypothyroidism    subclinical. low TSH, normal thyroid panel. biopsy 2010   Vitamin B12  deficiency     Medications:  No PTA anticoagulant of record  80 yo female with chest pain and elevated troponin.  Pharmacy has been consulted to initiate and monitor a heparin drip.  7/5 1653 HL <0.10 1000 > 1300    Goal of Therapy:  Heparin level 0.3-0.7 units/ml Monitor platelets by anticoagulation protocol: Yes   Plan:  Heparin subtherapeutic  Give 2350 units bolus x 1 Increase heparin infusion to 1300 units/hr Check anti-Xa level in 8 hours following rate change  Continue to monitor H&H and platelets  Dorothe Pea, PharmD, BCPS Clinical Pharmacist 09/16/2020 6:19 PM

## 2020-09-16 NOTE — Consult Note (Addendum)
Cardiology Consultation:   Patient ID: Mckenzie Taylor MRN: [id number]; DOB: 1940/08/08  Admit date: 09/16/2020 Date of Consult: 09/16/2020  PCP:  Denton Lank, MD   [facility] Providers Cardiologist:  Ida Rogue, MD   {    Patient Profile:   Mckenzie Taylor is a 80 y.o. female with a hx of  mild nonobstructive CAD (cath 2004), hypertension, hyperlipidemia, HFrEF, COPD on 3L home oxygen, GERD with B12 deficiency, hypothyroidism (bx 2010, subclinical), CKD, autosomal recessive PCKD (h/o adrenal adenoma, 2010), anemia of chronic disease, DM2, history of bilateral avascular necrosis of the hip, 5cm popliteal fossa cyst, and prior history of smoking 1 pack a day since the age of 30 (quit ~ 2015) who is being seen today for the evaluation of chest pain with elevated troponin at the request of Dr. Soledad Gerlach.Marland Kitchen  History of Present Illness:   Mckenzie Taylor is a 80 year old female with PMH as above.   Seen by [facility] 12/2008.  She was admitted to [facility] 10/2002 with complaint of chest pain and mild congestive heart failure.  07/02/2008 echocardiogram showed mild LVH and EF 60 to 65%. She underwent a diagnostic left heart cardiac catheterization during this hospitalization that showed 20% mid LAD stenosis, but otherwise no evidence of coronary artery disease.     She reported DOE and LEE at clinic.  She occasionally noted slight and sharp chest pain, lasting for only 2 to 3 seconds.  No associated diaphoresis, nausea, shortness of breath.  She also reported bilateral leg pain when ambulating, which did not sound consistent with leg cramping, as it began in her hips and shot down the lateral aspects of her legs.  This pain improved with rest.  It was found subsequently she had bilateral avascular necrosis of the hip. Smoking cessation was discussed with patient subsequently quitting in approximately 2015 after 1 pack a day since the age of 32 years.    She presented to [facility] ED 08/2016 with [facility] cardiology  consultation for pna.  Echocardiogram showed mild global LV systolic dysfunction with ejection fraction of 45%.  Troponin normal.  BNP 317.  EKG showed sinus tachycardia but without acute changes.  Admitted 10/2018 for atypical CP with cardiology consulted with Tn thought elevated due to demand ischemia in the setting of COPD exacerbation and volume overload. Echo EF 50-55%. Ischemic evaluation declined. She was IV diuresed and continued on medical therapy.   Admitted 09/16/20 for chest pain and elevated Tn. Per EMS, she received SL nitro and 4 baby ASA. Labs showed hyperkalemia with K 5.6, hyperglycemia with glucose 506, hyponatremia with sodium 133, Cr 2.17 with baseline Cr 1.6-2.3 and known CKD, Hgb 10.2 with Hct 33.9, HS Tn 241  391 and still cycling, albumin 3.3, LFTs wnl, UDS negative, lipase wnl, resp panel negative, A1C pending. Ddimer 0.67 and elevated with V/Q scan agreed upon by treatment team as taking priority over MPI (as outlined below) given pleuritic CP, ST, sedentary lifestyle, and elevated Ddimer. V/Q recommended over CTA given CKD.   Today, 09/16/2020, and during cardiology consultation, she describes chest pain that has been waxing and waning for some time now.  Her most recent episode started 3 days ago and while seated.  She reports that she is relatively sedentary, given her COPD and level of deconditioning.  The chest pain is described as a tight and squeezing central chest pain with radiation into the back between shoulder blades and constant.  It is pleuritic and possibly positional but not tender to  palpation.  She reports chronic SOB in the setting of COPD and 1 episode of emesis last night but otherwise no associated symptoms.  She reports occasional orthopnea and some lower extremity edema. She quit smoking 7 years ago and denies EtOH use.    Past Medical History:  Diagnosis Date   Anemia of chronic disease    Baseline hgb 8.0-8.9   Autosomal recessive polycystic kidneys     Avascular necrosis of hip ([place])    bilateral   CAD (coronary artery disease) 2004   2004 LHC with mild and nonobstructive CAD: 20% mid LAD stenosis   Chronic combined systolic and diastolic heart failure (HCC)    HFrEF, EF 45% last echo, LVH, diastolic dysfunction 8338   Chronic kidney disease    PCKD, CKD, adrenal adenoma, cyst   COPD (chronic obstructive pulmonary disease) ([place])    3L home oxygen   Diabetes type 2, controlled ([place])    Former heavy tobacco smoker    1 pk / day. Estimates quit ~ 2015   GERD (gastroesophageal reflux disease)    HTN (hypertension)    Hyperlipidemia with target LDL less than 70    Hypothyroidism    subclinical. low TSH, normal thyroid panel. biopsy 2010   Vitamin B12 deficiency     Past Surgical History:  Procedure Laterality Date   CHOLECYSTECTOMY     hip replacement     bilateral-secondary to avascular necrosis   right eye lens replacement     ULNAR NERVE REPAIR     bilateral   VESICOVAGINAL FISTULA CLOSURE W/ TAH       Home Medications:  Prior to Admission medications   Medication Sig Start Date End Date Taking? Authorizing Provider  amLODipine (NORVASC) 10 MG tablet Take 10 mg by mouth daily.     Yes [provider]  aspirin EC 81 MG EC tablet Take 1 tablet (81 mg total) by mouth daily. 11/11/18  Yes Gladstone Lighter, MD  atorvastatin (LIPITOR) 40 MG tablet Take 40 mg by mouth every evening.    Yes [provider]  busPIRone (BUSPAR) 5 MG tablet Take 5 mg by mouth 2 (two) times daily.   Yes [provider]  carvedilol (COREG) 6.25 MG tablet Take 1 tablet (6.25 mg total) by mouth 2 (two) times daily with a meal. Patient taking differently: Take 6.25 mg by mouth daily. 11/11/18  Yes Gladstone Lighter, MD  Cholecalciferol (VITAMIN D3) 5000 units CAPS Take 1 capsule by mouth daily.   Yes [provider]  Cyanocobalamin 1500 MCG TBDP Take 3 tablets by mouth daily.   Yes [provider]   Fluticasone-Umeclidin-Vilant 100-62.5-25 MCG/INH AEPB Inhale 1 puff into the lungs daily.   Yes [provider]  HUMALOG KWIKPEN 100 UNIT/ML KwikPen Inject 0-22 Units into the skin 3 (three) times daily with meals. Sliding scale 10/26/18  Yes [provider]  ipratropium-albuterol (DUONEB) 0.5-2.5 (3) MG/3ML SOLN Take 3 mLs by nebulization 4 (four) times daily. 03/28/16  Yes Vaughan Basta, MD  LEVEMIR FLEXTOUCH 100 UNIT/ML Pen Inject 43 Units into the skin at bedtime.    Yes [provider]  omeprazole (PRILOSEC) 20 MG capsule Take 20 mg by mouth daily.   Yes [provider]  oxyCODONE-acetaminophen (PERCOCET/ROXICET) 5-325 MG tablet Take 1 tablet by mouth at bedtime. 09/08/20  Yes [provider]  promethazine (PHENERGAN) 25 MG tablet Take 25 mg by mouth every 6 (six) hours as needed for nausea.    Yes [provider]  tiZANidine (ZANAFLEX) 4 MG tablet Take 4 mg by mouth 3 (three) times daily as needed for muscle spasms.    Yes [provider]  traZODone (DESYREL) 50 MG tablet Take 50 mg by mouth at bedtime. 08/10/16  Yes [provider]  albuterol (VENTOLIN HFA) 108 (90 Base) MCG/ACT inhaler Inhale 2 puffs into the lungs every 4 (four) hours as needed.     [provider]  FLUoxetine (PROZAC) 40 MG capsule Take 40 mg by mouth every evening.  Patient not taking: Reported on 09/16/2020    [provider]  metolazone (ZAROXOLYN) 2.5 MG tablet Take 1 tablet (2.5 mg total) by mouth daily as needed (for weight gain > 3 pounds in 1 day). 11/11/18   Gladstone Lighter, MD  oxyCODONE-acetaminophen (PERCOCET) 10-325 MG tablet Take 1 tablet by mouth 3 (three) times daily as needed for pain.  Patient not taking: Reported on 09/16/2020 12/08/17   [provider]  torsemide (DEMADEX) 20 MG tablet Take 1 tablet (20 mg total) by mouth daily. 11/12/18   Gladstone Lighter, MD    Inpatient Medications: Scheduled  Meds:  insulin aspart  0-15 Units Subcutaneous TID WC   insulin aspart  0-5 Units Subcutaneous QHS   sodium zirconium cyclosilicate  10 g Oral Once   Continuous Infusions:  heparin 1,000 Units/hr (09/16/20 0828)   PRN Meds: acetaminophen, albuterol, dextromethorphan-guaiFENesin, hydrALAZINE, morphine injection, nitroGLYCERIN, ondansetron (ZOFRAN) IV  Allergies:    Allergies  Allergen Reactions   Other Rash    Pt reports allergy to metals.    Social History:   Social History   Socioeconomic History   Marital status: Married    Spouse name: Not on file   Number of children: Not on file   Years of education: Not on file   Highest education level: Not on file  Occupational History   Not on file  Tobacco Use   Smoking status: Former    Packs/day: 1.00    Years: 50.00    Pack years: 50.00    Types: Cigarettes   Smokeless tobacco: Never   Tobacco comments:    1 ppd - 50 years   Substance and Sexual Activity   Alcohol use: No   Drug use: No   Sexual activity: Not Currently  Other Topics Concern   Not on file  Social History Narrative   Separated, has 2 adult children.    Retired Regulatory affairs officer, on disability.    Social Determinants of Health   Financial Resource Strain: Not on file  Food Insecurity: Not on file  Transportation Needs: Not on file  Physical Activity: Not on file  Stress: Not on file  Social Connections: Not on file  Intimate Partner Violence: Not on file    Family History:    Family History  Problem Relation Age of Onset   Cancer Father        lung   COPD Mother    Heart disease Mother    Heart disease Maternal Uncle      ROS:  Please see the history of present illness.  Review of Systems  Respiratory:  Positive for shortness of breath.   Cardiovascular:  Positive for chest pain, orthopnea and leg swelling.  Gastrointestinal:  Positive for vomiting.  Musculoskeletal:  Positive for back pain.  Neurological:  Negative for dizziness.  All  other systems reviewed and are negative.  All other ROS reviewed and negative.     Physical Exam/Data:   Vitals:  09/16/20 0631 09/16/20 0632 09/16/20 0633  BP:  (!) 155/98   Pulse:  (!) 117   Resp:  (!) 24   Temp:  99.2 F (37.3 C)   TempSrc:  Oral   SpO2: 99% 98%   Weight:   96.8 kg  Height:   5\' 5"  (1.651 m)   No intake or output data in the 24 hours ending 09/16/20 0842 Last 3 Weights 09/16/2020 08/22/2020 11/11/2018  Weight (lbs) 213 lb 6.5 oz 201 lb 205 lb 12.8 oz  Weight (kg) 96.8 kg 91.173 kg 93.35 kg     Body mass index is 35.51 kg/m.  General:  Obese female, NAD, joined by family HEENT: normal Lymph: no adenopathy Neck: JVD difficult to assess due to body habitus Vascular: Radial pulses 2+ bilaterally  Cardiac:  normal S1, S2; RRR; 2/6 systolic murmur Lungs:  Bilateral wheezing and reduced breath sounds with scattered rhonchi Abd: slightly firm and distended, not TTP Ext: trace to mild bilateral LE edema Musculoskeletal:  No deformities, BUE and BLE strength normal and equal Skin: warm and dry  Neuro:  shakes periodically, though appears connected with heavier breathing Psych:  Normal affect   EKG:  The EKG was personally reviewed and demonstrates:  ST with no acute ST/T changes Telemetry:  Telemetry was personally reviewed and demonstrates:  SR-ST  Relevant CV Studies: Echo 10/2018  1. The left ventricle has low normal systolic function, with an ejection  fraction of 50-55%. The cavity size was normal. There is mildly increased  left ventricular wall thickness. Left ventricular diastolic Doppler  parameters are consistent with  impaired relaxation.   2. The right ventricle has normal systolic function. The cavity was  normal. There is no increase in right ventricular wall thickness. Right  ventricular systolic pressure is normal with an estimated pressure of 26.2  mmHg.   3. Left atrial size was mildly dilated.   2010 cath Not under CV studies. See HPI =  "2010 she underwent a diagnostic left heart cardiac catheterization during admission that showed 20% mid LAD stenosis, but otherwise no evidence of coronary artery disease".     Laboratory Data:  High Sensitivity Troponin:   Recent Labs  Lab 08/22/20 2125 08/22/20 2335 09/16/20 0642 09/16/20 0819  TROPONINIHS 53* 60* 241* 391*     Chemistry Recent Labs  Lab 09/16/20 0642  NA 133*  K 5.6*  CL 98  CO2 26  GLUCOSE 506*  BUN 19  CREATININE 2.17*  CALCIUM 9.0  GFRNONAA 23*  ANIONGAP 9    Recent Labs  Lab 09/16/20 0642  PROT 6.6  ALBUMIN 3.3*  AST 29  ALT 14  ALKPHOS 59  BILITOT 0.3   Hematology Recent Labs  Lab 09/16/20 0642  WBC 11.6*  RBC 3.80*  HGB 10.2*  HCT 33.9*  MCV 89.2  MCH 26.8  MCHC 30.1  RDW 16.3*  PLT 365   BNPNo results for input(s): BNP, PROBNP in the last 168 hours.  DDimer  Recent Labs  Lab 09/16/20 [id number]  DDIMER 0.67*     Radiology/Studies:  DG Chest 2 View  Result Date: 09/16/2020 CLINICAL DATA:  80 year old female with history of chest pain and shortness of breath. EXAM: CHEST - 2 VIEW COMPARISON:  Chest x-ray 08/22/2020. FINDINGS: Lung volumes are normal. No acute consolidative airspace disease. No pleural effusions. Chronic scarring and volume loss in the right middle lobe, similar to prior examinations. No pneumothorax. No evidence of pulmonary edema. No definite suspicious appearing pulmonary nodule  or mass. Heart size is mildly enlarged. Upper mediastinal contours are within normal limits. Aortic atherosclerosis. IMPRESSION: 1. No radiographic evidence of acute cardiopulmonary disease. 2. Chronic scarring and volume loss in the right middle lobe, stable compared to prior examinations. 3. Cardiomegaly. 4. Aortic atherosclerosis. Electronically Signed   By: Vinnie Langton M.D.   On: 09/16/2020 07:09     Assessment and Plan:   NSTEMI with history of nonobstructive CAD s/p cath 2004 / 2010 --Reports CP as above in HPI and constant,  though somewhat improved from when starting 3d ago. CP similar to previous episodes in the past. HS Tn 241  391  1416 and still cycling. Echo ordered and pending.  If echo is without acute structural changes or reduced EF/WMA, will plan for stress testing on Thursday (and after completion of V/Q scan for elevated Ddimer / PE rule out as below).  If echo shows acute changes or reduced EF/wall motion abnormalities, tentative plan for cardiac catheterization for further ischemic work-up.  Risks and benefits of both invasive and noninvasive work-up were reviewed with patient understanding. We did discuss that we will need to exercise caution with contrast exposure given her current Cr. Continue to cycle HS Tn. Continue current medications. We will order a heart healthy /carb modified diet for today and placed n.p.o. after midnight.  Continue IV heparin.  Restart home beta-blocker and statin.  Recommend escalation of carvedilol to optimize heart rate and BP control.  Continue as needed sublingual nitro as needed for chest pain. (Of note, home medications appear ordered but not yet released.  She will need to receive these medications in the emergency department as below for GDMT)  Elevated Ddimer --Elevated Ddimer with need for PE rule out given ST, sedentary lifestyle, and elevated ddimer with associated SOB and CP. V/Q scan recommended for further workup given Cr precludes CTA. [place] of bilateral LE ordered to assess if LE DVT, pending.  HFmrEF --Shortness of breath likely multifactorial in the setting of her known COPD on home oxygen, chest discomfort, and comorbid conditions as above and including hyperglycemia.  Echo ordered and pending.  Previous 10/2018 echo with EF low normal 50 to 55%, mild LVH, RVSP 26.2 mmHg, mild LAE.  Recommend restart home diuretic regimen with torsemide 20 mg daily with close monitoring of renal function.  We will plan to hold metolazone 2.5 mg for now, this is prescribed as needed for  weight gain over 3 pounds in 1 day.  Further recommendations if needed pending echo results.  Daily BMET.  Continue current medications.  Monitor I's/O's, daily standing weights.  Hypertension --BP sub-optimal.  Recommend restart home medications, including amlodipine 10 mg daily, carvedilol 6.25 mg twice daily, torsemide 20 mg daily.  Hyperlipidemia --Continue home Lipitor 40 mg daily.  Hyperkalemia --K 5.6. Monitor potassium closely with consideration of influence of hyperglycemia as well.  Consider nephrology consult.  CKD / autosomal recessive PCKD with h/o adrenal adenoma --Daily BMET.  Caution with nephrotoxins, including IV contrast.  Caution with diuresis.  Consider nephrology consult.  Anemia of chornic dz --Daily CBC.  DM2, hyperglycemia. -- SSI, per IM.  Of note, corrected sodium 137 given glucose 352 and sodium 133.  COPD on home O2 --Caution with albuterol given cardiac SE profile and consider transition to Xopenex.  Continue home oxygen.  Per IM.  GERD --Continue PPI.  Per IM.  For questions or updates, please contact [facility] Please consult [email or web address] for contact info under    Signed, Malachi Bonds  Cloyde Reams, PA-C  09/16/2020 8:42 AM

## 2020-09-16 NOTE — H&P (Addendum)
History and Physical    Mckenzie Taylor WRU:[id number] DOB: [birth year] DOA: 09/16/2020  Referring MD/NP/PA:   PCP: Denton Lank, MD   Patient coming from:  The patient is coming from home.    Chief Complaint: chest pain  HPI: Mckenzie Taylor is a 80 y.o. female with medical history significant of HTN, HLD, DM, COPD on 2L of O2, GERD, dCHF, CAD, CKD-IV, former smoker, depression, anemia, who presents with chest pain.  Patient states that she has been having intermittent chest pain for almost 3 days.  It is located in the substernal area, moderate to severe, squeezing-like pain, radiating to the upper back, currently chest pain is mild.  Patient states that she has chronic shortness of breath due to COPD which has not changed significantly.  No cough, fever or chills.  Patient states that she has nausea and vomited once last night, currently no nausea, vomiting, diarrhea or abdominal pain.  No symptoms of UTI.  Patient moves all extremities normally. Patient denies rectal bleeding or dark stool.  No recent fall or any head injury. Pt was given 324 milligrams of aspirin by EMS.   ED Course: pt was found to have troponin level to 41, WBC 11.6, pending COVID-19 PCR, D-dimer positive 0.67, potassium 5.6, renal function close to baseline, temperature 99.2, blood pressure 155/98, heart rate 117, RR 24, oxygen saturation 98% on 3 L oxygen, chest x-ray negative for infiltration.  Patient is placed on progressive bed for observation.  Dr. Saunders Revel of cardiology is consulted.  Review of Systems:   General: no fevers, chills, no body weight gain, has fatigue HEENT: no blurry vision, hearing changes or sore throat Respiratory: has dyspnea, no coughing, wheezing CV: has chest pain, no palpitations GI: currently no nausea, vomiting, abdominal pain, diarrhea, constipation GU: no dysuria, burning on urination, increased urinary frequency, hematuria  Ext: has leg edema Neuro: no unilateral weakness,  numbness, or tingling, no vision change or hearing loss Skin: no rash, no skin tear. MSK: No muscle spasm, no deformity, no limitation of range of movement in spin Heme: No easy bruising.  Travel history: No recent long distant travel.  Allergy:  Allergies  Allergen Reactions   Other Rash    Pt reports allergy to metals.    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 Estimates quit ~ 2015   GERD (gastroesophageal reflux disease)    HTN (hypertension)    Hyperlipidemia with target LDL less than 70    Hypothyroidism    subclinical. low TSH, normal thyroid panel. biopsy 2010   Vitamin B12 deficiency     Past Surgical History:  Procedure Laterality Date   CHOLECYSTECTOMY     hip replacement     bilateral-secondary to avascular necrosis   right eye lens replacement  ULNAR NERVE REPAIR     bilateral   VESICOVAGINAL FISTULA CLOSURE W/ TAH      Social History:  reports that she has quit smoking. She has a 50.00 pack-year smoking history. She has never used smokeless tobacco. She reports that she does not drink alcohol and does not use drugs.  Family History:  Family History  Problem Relation Age of Onset   Cancer Father        lung   COPD Mother    Heart disease Mother    Heart disease Maternal Uncle      Prior to Admission medications   Medication Sig Start Date End Date Taking? Authorizing  Provider  albuterol (VENTOLIN HFA) 108 (90 BASE) MCG/ACT inhaler Inhale 2 puffs into the lungs every 4 (four) hours as needed.     [provider]  amLODipine (NORVASC) 10 MG tablet Take 10 mg by mouth daily.      [provider]  aspirin EC 81 MG EC tablet Take 1 tablet (81 mg total) by mouth daily. 11/11/18   Gladstone Lighter, MD  atorvastatin (LIPITOR) 40 MG tablet Take 40 mg by mouth every evening.     [provider]  busPIRone (BUSPAR) 5 MG tablet Take 5 mg by mouth 2 (two) times daily.    [provider]  carvedilol (COREG) 6.25 MG tablet Take 1 tablet (6.25 mg total) by mouth 2 (two) times daily with a meal. 11/11/18   Gladstone Lighter, MD  Cholecalciferol (VITAMIN D3) 5000 units CAPS Take 1 capsule by mouth daily.    [provider]  Cyanocobalamin 1500 MCG TBDP Take 3 tablets by mouth daily.    [provider]  FLUoxetine (PROZAC) 40 MG capsule Take 40 mg by mouth every evening.     [provider]  Fluticasone-Umeclidin-Vilant (TRELEGY ELLIPTA) 100-62.5-25 MCG/INH AEPB Inhale 1 puff into the lungs daily.    [provider]  HUMALOG KWIKPEN 100 UNIT/ML KwikPen Inject 0-22 Units into the skin 3 (three) times daily with meals. Sliding scale 10/26/18   [provider]  ipratropium-albuterol (DUONEB) 0.5-2.5 (3) MG/3ML SOLN Take 3 mLs by nebulization 4 (four) times daily. 03/28/16   Vaughan Basta, MD  LEVEMIR FLEXTOUCH 100 UNIT/ML Pen Inject 43 Units into the skin at bedtime.     [provider]  metolazone (ZAROXOLYN) 2.5 MG tablet Take 1 tablet (2.5 mg total) by mouth daily as needed (for weight gain > 3 pounds in 1 day). 11/11/18   Gladstone Lighter, MD  omeprazole (PRILOSEC) 20 MG capsule Take 20 mg by mouth daily.    [provider]  oxyCODONE-acetaminophen (PERCOCET) 10-325 MG tablet Take 1 tablet by mouth 3 (three) times daily as needed for pain.  12/08/17   [provider]   promethazine (PHENERGAN) 25 MG tablet Take 25 mg by mouth every 6 (six) hours as needed for nausea.     [provider]  tiZANidine (ZANAFLEX) 4 MG tablet Take 4 mg by mouth 3 (three) times daily as needed for muscle spasms.     [provider]  torsemide (DEMADEX) 20 MG tablet Take 1 tablet (20 mg total) by mouth daily. 11/12/18   Gladstone Lighter, MD  traZODone (DESYREL) 50 MG tablet Take 50 mg by mouth at bedtime. 08/10/16   [provider]    Physical Exam: Vitals:   09/16/20 0633 09/16/20 0730 09/16/20 0830 09/16/20 0900  BP:  (!) 157/86    Pulse:  (!) 110 (!) 116 Marland Kitchen)  117  Resp:  (!) 23 19 (!) 22  Temp:      TempSrc:      SpO2:  98% 98% 97%  Weight: 96.8 kg     Height: 5\' 5"  (1.651 m)      General: Not in acute distress HEENT:       Eyes: PERRL, EOMI, no scleral icterus.       ENT: No discharge from the ears and nose, no pharynx injection, no tonsillar enlargement.        Neck: No JVD, no bruit, no mass felt. Heme: No neck lymph node enlargement. Cardiac: S1/S2, RRR, No murmurs, No gallops or rubs. Respiratory: Slightly decreased air movement bilaterally, no wheezing or rhonchi on auscultation. GI: Soft, nondistended, nontender, no rebound pain, no organomegaly, BS present. GU: No hematuria Ext: has 1+ pitting leg edema bilaterally. 1+DP/PT pulse bilaterally. Musculoskeletal: No joint deformities, No joint redness or warmth, no limitation of ROM in spin. Skin: No rashes.  Neuro: Alert, oriented X3, cranial nerves II-XII grossly intact, moves all extremities normally. Psych: Patient is not psychotic, no suicidal or hemocidal ideation.  Labs on Admission: I have personally reviewed following labs and imaging studies  CBC: Recent Labs  Lab 09/16/20 0642  WBC 11.6*  HGB 10.2*  HCT 33.9*  MCV 89.2  PLT 563   Basic Metabolic Panel: Recent Labs  Lab 09/16/20 0642  NA 133*  K 5.6*  CL 98  CO2 26  GLUCOSE 506*  BUN 19  CREATININE 2.17*   CALCIUM 9.0   GFR: Estimated Creatinine Clearance: 24.6 mL/min (A) (by C-G formula based on SCr of 2.17 mg/dL (H)). Liver Function Tests: Recent Labs  Lab 09/16/20 0642  AST 29  ALT 14  ALKPHOS 59  BILITOT 0.3  PROT 6.6  ALBUMIN 3.3*   Recent Labs  Lab 09/16/20 0642  LIPASE 37   No results for input(s): AMMONIA in the last 168 hours. Coagulation Profile: Recent Labs  Lab 09/16/20 0813  INR 1.0   Cardiac Enzymes: No results for input(s): CKTOTAL, CKMB, CKMBINDEX, TROPONINI in the last 168 hours. BNP (last 3 results) No results for input(s): PROBNP in the last 8760 hours. HbA1C: No results for input(s): HGBA1C in the last 72 hours. CBG: Recent Labs  Lab 09/16/20 0922  GLUCAP 294*   Lipid Profile: No results for input(s): CHOL, HDL, LDLCALC, TRIG, CHOLHDL, LDLDIRECT in the last 72 hours. Thyroid Function Tests: No results for input(s): TSH, T4TOTAL, FREET4, T3FREE, THYROIDAB in the last 72 hours. Anemia Panel: No results for input(s): VITAMINB12, FOLATE, FERRITIN, TIBC, IRON, RETICCTPCT in the last 72 hours. Urine analysis:    Component Value Date/Time   COLORURINE Amber 10/21/2012 1145   APPEARANCEUR Cloudy 10/21/2012 1145   LABSPEC 1.015 10/21/2012 1145   PHURINE 6.0 10/21/2012 1145   GLUCOSEU Negative 10/21/2012 1145   HGBUR 1+ 10/21/2012 1145   BILIRUBINUR Negative 10/21/2012 1145   KETONESUR Negative 10/21/2012 1145   PROTEINUR >=500 10/21/2012 1145   NITRITE Positive 10/21/2012 1145   LEUKOCYTESUR 3+ 10/21/2012 1145   Sepsis Labs: @LABRCNTIP (procalcitonin:4,lacticidven:4) ) Recent Results (from the past 240 hour(s))  Resp Panel by RT-PCR (Flu A&B, Covid) Nasopharyngeal Swab     Status: None   Collection Time: 09/16/20  8:13 AM   Specimen: Nasopharyngeal Swab; Nasopharyngeal(NP) swabs in vial transport medium  Result Value Ref Range Status   SARS Coronavirus 2 by RT PCR NEGATIVE NEGATIVE Final    Comment: (NOTE) SARS-CoV-2 target nucleic acids  are NOT DETECTED.  The SARS-CoV-2 RNA is generally detectable in upper respiratory specimens during the acute phase of infection. The lowest concentration of SARS-CoV-2 viral copies this assay can detect is 138 copies/mL. A negative result does not preclude SARS-Cov-2 infection and should not be used as the sole basis for treatment or other patient management decisions. A negative result may occur with  improper specimen collection/handling, submission of specimen other than nasopharyngeal swab, presence of viral mutation(s) within the areas targeted by this assay, and inadequate number of viral copies(<138 copies/mL). A negative result must be combined with clinical observations, patient history, and epidemiological information. The expected result is Negative.  Fact Sheet for Patients:  EntrepreneurPulse.com.au  Fact Sheet for Healthcare Providers:  IncredibleEmployment.be  This test is no t yet approved or cleared by the [place] FDA and  has been authorized for detection and/or diagnosis of SARS-CoV-2 by FDA under an Emergency Use Authorization (EUA). This EUA will remain  in effect (meaning this test can be used) for the duration of the COVID-19 declaration under Section 564(b)(1) of the Act, 21 U.S.C.section 360bbb-3(b)(1), unless the authorization is terminated  or revoked sooner.       Influenza A by PCR NEGATIVE NEGATIVE Final   Influenza B by PCR NEGATIVE NEGATIVE Final    Comment: (NOTE) The Xpert Xpress SARS-CoV-2/FLU/RSV plus assay is intended as an aid in the diagnosis of influenza from Nasopharyngeal swab specimens and should not be used as a sole basis for treatment. Nasal washings and aspirates are unacceptable for Xpert Xpress SARS-CoV-2/FLU/RSV testing.  Fact Sheet for Patients: EntrepreneurPulse.com.au  Fact Sheet for Healthcare Providers: IncredibleEmployment.be  This test is  not yet approved or cleared by the [place] FDA and has been authorized for detection and/or diagnosis of SARS-CoV-2 by FDA under an Emergency Use Authorization (EUA). This EUA will remain in effect (meaning this test can be used) for the duration of the COVID-19 declaration under Section 564(b)(1) of the Act, 21 U.S.C. section 360bbb-3(b)(1), unless the authorization is terminated or revoked.  Performed at [facility], [address] [place], [place] [address]      Radiological Exams on Admission: DG Chest 2 View  Result Date: 09/16/2020 CLINICAL DATA:  80 year old female with history of chest pain and shortness of breath. EXAM: CHEST - 2 VIEW COMPARISON:  Chest x-ray 08/22/2020. FINDINGS: Lung volumes are normal. No acute consolidative airspace disease. No pleural effusions. Chronic scarring and volume loss in the right middle lobe, similar to prior examinations. No pneumothorax. No evidence of pulmonary edema. No definite suspicious appearing pulmonary nodule or mass. Heart size is mildly enlarged. Upper mediastinal contours are within normal limits. Aortic atherosclerosis. IMPRESSION: 1. No radiographic evidence of acute cardiopulmonary disease. 2. Chronic scarring and volume loss in the right middle lobe, stable compared to prior examinations. 3. Cardiomegaly. 4. Aortic atherosclerosis. Electronically Signed   By: Vinnie Langton M.D.   On: 09/16/2020 07:09     EKG: I have personally reviewed.  Sinus rhythm, QTC 469, LAE, borderline LAD, poor R wave progression, nonspecific T wave change  Assessment/Plan Principal Problem:   NSTEMI (non-ST elevated myocardial infarction) (HCC) Active Problems:   GERD (gastroesophageal reflux disease)   COPD (chronic obstructive pulmonary disease) (HCC)   Chest pain   HTN (hypertension)   HLD (hyperlipidemia)   Type II diabetes mellitus with renal manifestations (HCC)   CKD (chronic kidney disease), stage IV (HCC)   CAD (coronary  artery disease)   Chronic diastolic CHF (congestive heart failure) ([place])  Hyperkalemia   Positive D dimer   Chest pain, hx of CAD and NSTEMI (non-ST elevated myocardial infarction) [facility]): Patient has been having intermittent chest pain for almost 3 days.  Troponin is elevated to 241, indicating non-STEMI. Dr. Saunders Revel of card is consulted.  - place to progressive unit for observation - IV heparin started in ED - Trend Trop - prn Nitroglycerin, Morphine, and aspirin, lipitor  - Risk factor stratification: will check FLP and A1C  - check UDS - 2d echo  Positive D dimer: -will get V/Q scan to r/o PE --> negative for DVT -will get LE doppler to r/o DVT --> negative for PE  GERD (gastroesophageal reflux disease): -Protonix  COPD (chronic obstructive pulmonary disease) ([place]): Stable. -Bronchodilators,  HTN (hypertension) -IV hydralazine as needed -Continue home amlodipine, Coreg  HLD (hyperlipidemia) -Lipitor  Type II diabetes mellitus with renal manifestations [facility]): Recent A1c 8.3, poorly controlled.  Patient taking Humalog and Levemir at home.  Initial blood sugar 506, which improved to 294 after giving 10 unit NovoLog IV.  Anion gap 9, no DKA. -Sliding scale insulin -Decrease Levemir dose from 43 to 30 unit daily  CKD (chronic kidney disease), stage IV [facility]): Renal function close to baseline.  Recent baseline creatinine 1.7-2.3.  Her creatinine is at 2.17, BUN 19 -Follow-up by email  Chronic diastolic CHF (congestive heart failure) ([place]): 2D echo 11/09/2018 showed EF 50-55%.  Patient has 1+ leg edema, but no JVD.  No pulm edema chest x-ray.  Does not seem to have CHF exacerbation. -continue torsemide  -Check BNP  Hyperkalemia:  -Patient also given 10 units of NovoLog -10 g of leukoma           DVT ppx: on IV Heparin        Code Status: DNR (I discussed with patient and explained the meaning of CODE STATUS. Patient  is very clear that she wants to be on DNR). Her son  confirmed DNR status Family Communication: I spoke with her son on phone Disposition Plan:  Anticipate discharge back to previous environment Consults called:  Dr. Saunders Revel of card Admission status and Level of care: Progressive Cardiac:    for obs  Status is: Observation  The patient remains OBS appropriate and will d/c before 2 midnights.  Dispo: The patient is from: Home              Anticipated d/c is to: Home              Patient currently is not medically stable to d/c.   Difficult to place patient No           Date of Service 09/16/2020    [facility]   If 7PM-7AM, please contact night-coverage [email or web address] 09/16/2020, 10:20 AM

## 2020-09-16 NOTE — ED Provider Notes (Signed)
[facility] Emergency Department Provider Note  Time seen: 7:27 AM  I have reviewed the triage vital signs and the nursing notes.   HISTORY  Chief Complaint Chest Pain (C/oi CP x24 hours radiating through to back. Pt. Denies diaphoresis, SOB (abnormal) dizziness, or n/v/d.)   HPI Mckenzie Taylor is a 80 y.o. female with a past medical history of anemia, CAD, CHF, COPD on 3 L of oxygen 24/7, diabetes, hypertension, hyperlipidemia, past MI, presents to the emergency department for chest pain.  According to the patient since last night she has been experiencing discomfort in the center of her chest which she describes as an aching type pain.  Patient states she took Mylanta without any relief.  This morning she was still having chest pain so she called EMS received nitroglycerin and 4 baby aspirin and states no relief still.  Patient states mild shortness of breath but states that is chronic for her.  Denies any leg pain or swelling.  No fever or cough.     Patient Active Problem List   Diagnosis Date Noted   Demand ischemia ([place]) 11/10/2018   Acute on chronic combined systolic and diastolic CHF (congestive heart failure) ([place]) 11/10/2018   Chest pain 11/08/2018   Cellulitis of right leg 02/10/2018   COPD exacerbation ([place]) 01/04/2018   CHF (congestive heart failure) ([place]) 01/06/2017   COPD (chronic obstructive pulmonary disease) ([place]) 11/25/2016   Nausea and vomiting 10/29/2016   Acute on chronic congestive heart failure ([place])    Acute pulmonary edema ([place])    Acute respiratory failure with hypoxia ([place]) 10/25/2016   COPD with acute exacerbation ([place]) 08/14/2016   CAP (community acquired pneumonia) 16/12/9602   Acute systolic CHF (congestive heart failure) ([place]) 08/14/2016   Accelerated hypertension 08/14/2016   GERD (gastroesophageal reflux disease) 08/14/2016   Diabetes ([place]) 08/14/2016   Respiratory distress 05/05/2016   Acute respiratory failure ([place])  03/24/2016   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 Authorizing Provider  albuterol (VENTOLIN HFA) 108 (90 BASE) MCG/ACT inhaler Inhale 2 puffs into the lungs every 4 (four) hours as needed.     [provider]  amLODipine (NORVASC) 10 MG tablet Take 10 mg by mouth daily.      [provider]  aspirin EC 81 MG EC tablet Take 1 tablet (81 mg total) by mouth daily. 11/11/18   Gladstone Lighter, MD  atorvastatin (LIPITOR) 40 MG tablet Take 40 mg by mouth every evening.     [provider]  busPIRone (BUSPAR) 5 MG tablet Take 5 mg by mouth 2 (two) times daily.    [provider]  carvedilol (COREG) 6.25 MG tablet Take 1 tablet (6.25 mg total) by mouth 2 (two) times daily with a meal. 11/11/18   Gladstone Lighter, MD  Cholecalciferol (VITAMIN D3) 5000 units CAPS Take 1 capsule by mouth daily.    [provider]  Cyanocobalamin 1500 MCG TBDP Take  3 tablets by mouth daily.    [provider]  FLUoxetine (PROZAC) 40 MG capsule Take 40 mg by mouth every evening.     [provider]  Fluticasone-Umeclidin-Vilant (TRELEGY ELLIPTA) 100-62.5-25 MCG/INH AEPB Inhale 1 puff into the lungs daily.    [provider]  HUMALOG KWIKPEN 100 UNIT/ML KwikPen Inject 0-22 Units into the skin 3 (three) times daily with meals. Sliding scale 10/26/18   [provider]  ipratropium-albuterol (DUONEB) 0.5-2.5 (3) MG/3ML SOLN  Take 3 mLs by nebulization 4 (four) times daily. 03/28/16   Vaughan Basta, MD  LEVEMIR FLEXTOUCH 100 UNIT/ML Pen Inject 43 Units into the skin at bedtime.     [provider]  metolazone (ZAROXOLYN) 2.5 MG tablet Take 1 tablet (2.5 mg total) by mouth daily as needed (for weight gain > 3 pounds in 1 day). 11/11/18   Gladstone Lighter, MD  omeprazole (PRILOSEC) 20 MG capsule Take 20 mg by mouth daily.    [provider]  oxyCODONE-acetaminophen (PERCOCET) 10-325 MG tablet Take 1 tablet by mouth 3 (three) times daily as needed for pain.  12/08/17   [provider]  promethazine (PHENERGAN) 25 MG tablet Take 25 mg by mouth every 6 (six) hours as needed for nausea.     [provider]  tiZANidine (ZANAFLEX) 4 MG tablet Take 4 mg by mouth 3 (three) times daily as needed for muscle spasms.     [provider]  torsemide (DEMADEX) 20 MG tablet Take 1 tablet (20 mg total) by mouth daily. 11/12/18   Gladstone Lighter, MD  traZODone (DESYREL) 50 MG tablet Take 50 mg by mouth at bedtime. 08/10/16   [provider]    Allergies  Allergen Reactions   Other Rash    Pt reports allergy to metals.    Family History  Problem Relation Age of Onset   Cancer Father        lung   COPD Mother    Heart disease Mother    Heart disease Maternal Uncle     Social History Social History   Tobacco Use   Smoking status: Former    Packs/day: 1.00    Years: 50.00    Pack years: 50.00    Types: Cigarettes   Smokeless tobacco: Never   Tobacco comments:    1 ppd - 50 years   Substance Use Topics   Alcohol use: No   Drug use: No    Review of Systems Constitutional: Negative for fever. Cardiovascular: Central chest pain, mild dull. Respiratory: Mild shortness of breath, chronic.  No cough. Gastrointestinal: Negative for abdominal pain, vomiting  Musculoskeletal: Negative for leg pain or swelling. Neurological: Negative for headache All other ROS  negative  ____________________________________________   PHYSICAL EXAM:  VITAL SIGNS: ED Triage Vitals  Enc Vitals Group     BP 09/16/20 0632 (!) 155/98     Pulse Rate 09/16/20 0632 (!) 117     Resp 09/16/20 0632 (!) 24     Temp 09/16/20 0632 99.2 F (37.3 C)     Temp Source 09/16/20 0632 Oral     SpO2 09/16/20 0631 99 %     Weight 09/16/20 0633 213 lb 6.5 oz (96.8 kg)     Height 09/16/20 0633 5\' 5"  (1.651 m)     Head Circumference --      Peak Flow --      Pain Score 09/16/20 0632 8     Pain Loc --  Pain Edu? --      Excl. in [place]? --    Constitutional: Alert and oriented. Well appearing and in no distress. Eyes: Normal exam ENT      Head: Normocephalic and atraumatic.      Mouth/Throat: Mucous membranes are moist. Cardiovascular: Normal rate, regular rhythm.  Respiratory: Normal respiratory effort without tachypnea nor retractions. Breath sounds are clear  Gastrointestinal: Soft and nontender. No distention.   Musculoskeletal: Nontender with normal range of motion in all extremities.  Neurologic:  Normal speech and language. No gross focal neurologic deficits Skin:  Skin is warm, dry and intact.  Psychiatric: Mood and affect are normal.   ____________________________________________    EKG  EKG viewed and interpreted by myself shows sinus tachycardia 117 bpm with a narrow QRS, left axis deviation, largely normal intervals and nonspecific ST changes.  ____________________________________________    RADIOLOGY  Chest x-ray negative  ____________________________________________   INITIAL IMPRESSION / ASSESSMENT AND PLAN / ED COURSE  Pertinent labs & imaging results that were available during my care of the patient were reviewed by me and considered in my medical decision making (see chart for details).   Patient presents to the emergency department for chest pain since last night.  States has been constant throughout the night.  States she is not sure if it  is her heart or indigestion.  Patient is mildly tachypneic mildly tachycardic, satting 98% on 2 L.  Wears 3 L at baseline.  EKG shows nonspecific findings.  Chest x-ray is nonrevealing.  We will check labs including cardiac enzymes.  We will also check a D-dimer as the patient states there is a degree of pleuritic pain as well.  Patient's troponin is elevated greater than 200 we will start on a heparin infusion for the possible NSTEMI.  Troponin is increasing consistent with NSTEMI.  Heparin infusion ongoing.  Mckenzie Taylor was evaluated in Emergency Department on 09/16/2020 for the symptoms described in the history of present illness. She was evaluated in the context of the global COVID-19 pandemic, which necessitated consideration that the patient might be at risk for infection with the SARS-CoV-2 virus that causes COVID-19. Institutional protocols and algorithms that pertain to the evaluation of patients at risk for COVID-19 are in a state of rapid change based on information released by regulatory bodies including the CDC and federal and state organizations. These policies and algorithms were followed during the patient's care in the ED.  CRITICAL CARE Performed by: Harvest Dark   Total critical care time: 30 minutes  Critical care time was exclusive of separately billable procedures and treating other patients.  Critical care was necessary to treat or prevent imminent or life-threatening deterioration.  Critical care was time spent personally by me on the following activities: development of treatment plan with patient and/or surrogate as well as nursing, discussions with consultants, evaluation of patient's response to treatment, examination of patient, obtaining history from patient or surrogate, ordering and performing treatments and interventions, ordering and review of laboratory studies, ordering and review of radiographic studies, pulse oximetry and re-evaluation of patient's  condition.  ____________________________________________   FINAL CLINICAL IMPRESSION(S) / ED DIAGNOSES  NSTEMI   Harvest Dark, MD 09/16/20 1021

## 2020-09-17 DIAGNOSIS — I5032 Chronic diastolic (congestive) heart failure: Secondary | ICD-10-CM | POA: Diagnosis not present

## 2020-09-17 DIAGNOSIS — I255 Ischemic cardiomyopathy: Secondary | ICD-10-CM | POA: Diagnosis present

## 2020-09-17 DIAGNOSIS — I5042 Chronic combined systolic (congestive) and diastolic (congestive) heart failure: Secondary | ICD-10-CM | POA: Diagnosis present

## 2020-09-17 DIAGNOSIS — Z20822 Contact with and (suspected) exposure to covid-19: Secondary | ICD-10-CM | POA: Diagnosis present

## 2020-09-17 DIAGNOSIS — E039 Hypothyroidism, unspecified: Secondary | ICD-10-CM | POA: Diagnosis present

## 2020-09-17 DIAGNOSIS — I2510 Atherosclerotic heart disease of native coronary artery without angina pectoris: Secondary | ICD-10-CM | POA: Diagnosis present

## 2020-09-17 DIAGNOSIS — J9611 Chronic respiratory failure with hypoxia: Secondary | ICD-10-CM | POA: Diagnosis present

## 2020-09-17 DIAGNOSIS — I130 Hypertensive heart and chronic kidney disease with heart failure and stage 1 through stage 4 chronic kidney disease, or unspecified chronic kidney disease: Secondary | ICD-10-CM | POA: Diagnosis present

## 2020-09-17 DIAGNOSIS — J449 Chronic obstructive pulmonary disease, unspecified: Secondary | ICD-10-CM | POA: Diagnosis present

## 2020-09-17 DIAGNOSIS — I5020 Unspecified systolic (congestive) heart failure: Secondary | ICD-10-CM

## 2020-09-17 DIAGNOSIS — E669 Obesity, unspecified: Secondary | ICD-10-CM | POA: Diagnosis present

## 2020-09-17 DIAGNOSIS — E785 Hyperlipidemia, unspecified: Secondary | ICD-10-CM | POA: Diagnosis present

## 2020-09-17 DIAGNOSIS — Z6834 Body mass index (BMI) 34.0-34.9, adult: Secondary | ICD-10-CM | POA: Diagnosis not present

## 2020-09-17 DIAGNOSIS — I25118 Atherosclerotic heart disease of native coronary artery with other forms of angina pectoris: Secondary | ICD-10-CM | POA: Diagnosis not present

## 2020-09-17 DIAGNOSIS — N184 Chronic kidney disease, stage 4 (severe): Secondary | ICD-10-CM | POA: Diagnosis present

## 2020-09-17 DIAGNOSIS — E538 Deficiency of other specified B group vitamins: Secondary | ICD-10-CM | POA: Diagnosis present

## 2020-09-17 DIAGNOSIS — E875 Hyperkalemia: Secondary | ICD-10-CM | POA: Diagnosis present

## 2020-09-17 DIAGNOSIS — E1122 Type 2 diabetes mellitus with diabetic chronic kidney disease: Secondary | ICD-10-CM | POA: Diagnosis present

## 2020-09-17 DIAGNOSIS — Q613 Polycystic kidney, unspecified: Secondary | ICD-10-CM | POA: Diagnosis not present

## 2020-09-17 DIAGNOSIS — E1165 Type 2 diabetes mellitus with hyperglycemia: Secondary | ICD-10-CM | POA: Diagnosis present

## 2020-09-17 DIAGNOSIS — K219 Gastro-esophageal reflux disease without esophagitis: Secondary | ICD-10-CM | POA: Diagnosis present

## 2020-09-17 DIAGNOSIS — Z9049 Acquired absence of other specified parts of digestive tract: Secondary | ICD-10-CM | POA: Diagnosis not present

## 2020-09-17 DIAGNOSIS — I213 ST elevation (STEMI) myocardial infarction of unspecified site: Secondary | ICD-10-CM | POA: Diagnosis present

## 2020-09-17 DIAGNOSIS — I214 Non-ST elevation (NSTEMI) myocardial infarction: Secondary | ICD-10-CM | POA: Diagnosis not present

## 2020-09-17 DIAGNOSIS — I252 Old myocardial infarction: Secondary | ICD-10-CM | POA: Diagnosis not present

## 2020-09-17 DIAGNOSIS — Z96643 Presence of artificial hip joint, bilateral: Secondary | ICD-10-CM | POA: Diagnosis present

## 2020-09-17 DIAGNOSIS — Z66 Do not resuscitate: Secondary | ICD-10-CM | POA: Diagnosis present

## 2020-09-17 DIAGNOSIS — D631 Anemia in chronic kidney disease: Secondary | ICD-10-CM | POA: Diagnosis present

## 2020-09-17 LAB — BASIC METABOLIC PANEL
Anion gap: 5 (ref 5–15)
BUN: 23 mg/dL (ref 8–23)
CO2: 31 mmol/L (ref 22–32)
Calcium: 8.9 mg/dL (ref 8.9–10.3)
Chloride: 98 mmol/L (ref 98–111)
Creatinine, Ser: 2.2 mg/dL — ABNORMAL HIGH (ref 0.44–1.00)
GFR, Estimated: 22 mL/min — ABNORMAL LOW (ref >60)
Glucose, Bld: 298 mg/dL — ABNORMAL HIGH (ref 70–99)
Potassium: 5.5 mmol/L — ABNORMAL HIGH (ref 3.5–5.1)
Sodium: 134 mmol/L — ABNORMAL LOW (ref 135–145)

## 2020-09-17 LAB — CBC
HCT: 32.5 % — ABNORMAL LOW (ref 36.0–46.0)
Hemoglobin: 9.7 g/dL — ABNORMAL LOW (ref 12.0–15.0)
MCH: 26.9 pg (ref 26.0–34.0)
MCHC: 29.8 g/dL — ABNORMAL LOW (ref 30.0–36.0)
MCV: 90.3 fL (ref 80.0–100.0)
Platelets: 304 10*3/uL (ref 150–400)
RBC: 3.6 MIL/uL — ABNORMAL LOW (ref 3.87–5.11)
RDW: 16.1 % — ABNORMAL HIGH (ref 11.5–15.5)
WBC: 12.8 10*3/uL — ABNORMAL HIGH (ref 4.0–10.5)
nRBC: 0 % (ref 0.0–0.2)

## 2020-09-17 LAB — GLUCOSE, CAPILLARY
Glucose-Capillary: 269 mg/dL — ABNORMAL HIGH (ref 70–99)
Glucose-Capillary: 138 mg/dL — ABNORMAL HIGH (ref 70–99)
Glucose-Capillary: 247 mg/dL — ABNORMAL HIGH (ref 70–99)
Glucose-Capillary: 338 mg/dL — ABNORMAL HIGH (ref 70–99)

## 2020-09-17 LAB — HEMOGLOBIN A1C
Hgb A1c MFr Bld: 8.4 % — ABNORMAL HIGH (ref 4.8–5.6)
Mean Plasma Glucose: 194 mg/dL

## 2020-09-17 LAB — HEPARIN LEVEL (UNFRACTIONATED)
Heparin Unfractionated: 0.19 IU/mL — ABNORMAL LOW (ref 0.30–0.70)
Heparin Unfractionated: 0.21 IU/mL — ABNORMAL LOW (ref 0.30–0.70)
Heparin Unfractionated: 0.21 IU/mL — ABNORMAL LOW (ref 0.30–0.70)

## 2020-09-17 LAB — LIPID PANEL
Cholesterol: 259 mg/dL — ABNORMAL HIGH (ref 0–200)
HDL: 43 mg/dL (ref >40)
LDL Cholesterol: 142 mg/dL — ABNORMAL HIGH (ref 0–99)
Total CHOL/HDL Ratio: 6 RATIO
Triglycerides: 370 mg/dL — ABNORMAL HIGH (ref <150)
VLDL: 74 mg/dL — ABNORMAL HIGH (ref 0–40)

## 2020-09-17 MED ORDER — HEPARIN BOLUS VIA INFUSION
2300.0000 [IU] | Freq: Once | INTRAVENOUS | Status: AC
  Administered 2020-09-17: 2300 [IU] via INTRAVENOUS
  Filled 2020-09-17: qty 2300

## 2020-09-17 MED ORDER — LIDOCAINE VISCOUS HCL 2 % MT SOLN
15.0000 mL | Freq: Four times a day (QID) | OROMUCOSAL | Status: DC | PRN
  Filled 2020-09-17: qty 15

## 2020-09-17 MED ORDER — HEPARIN BOLUS VIA INFUSION
1200.0000 [IU] | Freq: Once | INTRAVENOUS | Status: AC
  Administered 2020-09-17: 1200 [IU] via INTRAVENOUS
  Filled 2020-09-17: qty 1200

## 2020-09-17 MED ORDER — ALUM & MAG HYDROXIDE-SIMETH 200-200-20 MG/5ML PO SUSP
30.0000 mL | ORAL | Status: DC | PRN
  Administered 2020-09-17: 30 mL via ORAL
  Filled 2020-09-17: qty 30

## 2020-09-17 MED ORDER — SODIUM ZIRCONIUM CYCLOSILICATE 10 G PO PACK
10.0000 g | PACK | Freq: Once | ORAL | Status: AC
  Administered 2020-09-17: 10 g via ORAL
  Filled 2020-09-17: qty 1

## 2020-09-17 MED ORDER — CARVEDILOL 3.125 MG PO TABS
3.1250 mg | ORAL_TABLET | Freq: Two times a day (BID) | ORAL | Status: DC
  Administered 2020-09-17: 3.125 mg via ORAL
  Filled 2020-09-17: qty 1

## 2020-09-17 MED ORDER — ACETAMINOPHEN 325 MG PO TABS: 650.0000 mg | ORAL_TABLET | Freq: Four times a day (QID) | ORAL | Status: DC | PRN

## 2020-09-17 MED ORDER — CARVEDILOL 6.25 MG PO TABS
6.2500 mg | ORAL_TABLET | Freq: Two times a day (BID) | ORAL | Status: DC
  Administered 2020-09-17 – 2020-09-18 (×2): 6.25 mg via ORAL
  Filled 2020-09-17 (×2): qty 1

## 2020-09-17 NOTE — Progress Notes (Signed)
Pt is A&O, VS stable, NSR/tach on the monitor, IVx2 in place. 4L [place] off and on. Gets up ad lib in room. No complaints of pain or discomfort. Did call out with complaints of nausea, relieved with phenergan. Heparin going, rate now 16/hr.NPO since MN, plan for stress test vs LHC today

## 2020-09-17 NOTE — Progress Notes (Signed)
PROGRESS NOTE    Mckenzie Taylor  ERX:[id number] DOB: 03-08-1941 DOA: 09/16/2020 PCP: Denton Lank, MD  [id number]   Assessment & Plan:   Principal Problem:   NSTEMI (non-ST elevated myocardial infarction) [facility]) Active Problems:   GERD (gastroesophageal reflux disease)   COPD (chronic obstructive pulmonary disease) ([place])   Chest pain   HTN (hypertension)   HLD (hyperlipidemia)   Type II diabetes mellitus with renal manifestations ([place])   CKD (chronic kidney disease), stage IV (HCC)   CAD (coronary artery disease)   Chronic diastolic CHF (congestive heart failure) (HCC)   Hyperkalemia   Positive D dimer   HFrEF (heart failure with reduced ejection fraction) ([place])   Mckenzie Taylor is a 80 y.o. female with medical history significant of HTN, HLD, DM, COPD on 2L of O2, GERD, dCHF, CAD, CKD-IV, former smoker, depression, anemia, who presents with chest pain.   Chest pain, and NSTEMI  hx of CAD -Troponins peaked at 1416 Plan: --cont heparin gtt for 48 hours -Patient continues to decline left heart cath -Medical management with aspirin, Plavix.   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   Chronic combined CHF with EF 25 to 30% --increase coreg to 6.25 mg BID -If BP tolerates, consider starting BiDil tomorrow. -Avoiding ACE/ARB/Arni due to renal dysfunction. -Continue torsemide 20 mg daily.   GERD (gastroesophageal reflux disease): --cont PPI   COPD (chronic obstructive pulmonary disease) ([place]): Stable. --cont DuoNeb and inhalers   HTN (hypertension) --cont coreg (increased dose) and torsemide   HLD (hyperlipidemia) -Lipitor   Type II diabetes mellitus with renal manifestations [facility]): Recent A1c 8.3, poorly controlled.  Patient taking Humalog and Levemir at home.  Initial blood sugar 506, which improved to 294 after giving 10 unit NovoLog IV.  Anion gap 9, no DKA. -Sliding scale insulin -cont Levemir 30u nightly   CKD (chronic kidney disease), stage IV [facility]):   Renal function close to baseline.  Recent baseline creatinine 1.7-2.3.  Her creatinine is at 2.17, BUN 19   Hyperkalemia: -s/p Lokelma --monitor    DVT prophylaxis: PJ:[id number] gtt Code Status: DNR  Family Communication:  Level of care: Progressive Cardiac Dispo:   The patient is from: home Anticipated d/c is to: home Anticipated d/c date is: 1-2 days Patient currently is not medically ready to d/c due to: on heparin gtt   Subjective and Interval History:  No chest pain.  Breathing at baseline.   Objective: Vitals:   09/17/20 1138 09/17/20 1519 09/17/20 1547 09/17/20 2004  BP: 132/78 129/81  119/66  Pulse: 93 (!) 102  98  Resp: 17 17  17   Temp: 98.1 F (36.7 C) 98.2 F (36.8 C)  98.4 F (36.9 C)  TempSrc: Oral   Oral  SpO2: 98% 98% 98% 95%  Weight:      Height:        Intake/Output Summary (Last 24 hours) at 09/18/2020 0124 Last data filed at 09/17/2020 1809 Gross per 24 hour  Intake 1455.35 ml  Output 1625 ml  Net -169.65 ml   Filed Weights   09/16/20 0633 09/16/20 2000  Weight: 96.8 kg 93.8 kg    Examination:   Constitutional: NAD, AAOx3 HEENT: conjunctivae and lids normal, EOMI CV: No cyanosis.   RESP: normal respiratory effort, on 3L Extremities: No effusions, edema in BLE SKIN: warm, dry Neuro: II - XII grossly intact.   Psych: Normal mood and affect.  Appropriate judgement and reason   Data Reviewed: I have personally reviewed following labs and imaging studies  CBC: Recent Labs  Lab 09/16/20 0642 09/17/20 0316  WBC 11.6* 12.8*  HGB 10.2* 9.7*  HCT 33.9* 32.5*  MCV 89.2 90.3  PLT 365 329   Basic Metabolic Panel: Recent Labs  Lab 09/16/20 0642 09/16/20 1653 09/17/20 0316  NA 133*  --  134*  K 5.6*  --  5.5*  CL 98  --  98  CO2 26  --  31  GLUCOSE 506*  --  298*  BUN 19  --  23  CREATININE 2.17*  --  2.20*  CALCIUM 9.0  --  8.9  MG  --  2.6*  --    GFR: Estimated Creatinine Clearance: 23.9 mL/min (A) (by C-G formula based on  SCr of 2.2 mg/dL (H)). Liver Function Tests: Recent Labs  Lab 09/16/20 0642  AST 29  ALT 14  ALKPHOS 59  BILITOT 0.3  PROT 6.6  ALBUMIN 3.3*   Recent Labs  Lab 09/16/20 0642  LIPASE 37   No results for input(s): AMMONIA in the last 168 hours. Coagulation Profile: Recent Labs  Lab 09/16/20 0813  INR 1.0   Cardiac Enzymes: No results for input(s): CKTOTAL, CKMB, CKMBINDEX, TROPONINI in the last 168 hours. BNP (last 3 results) No results for input(s): PROBNP in the last 8760 hours. HbA1C: Recent Labs    09/16/20 0819  HGBA1C 8.4*   CBG: Recent Labs  Lab 09/16/20 2121 09/17/20 0724 09/17/20 1138 09/17/20 1611 09/17/20 2113  GLUCAP 188* 269* 138* 247* 338*   Lipid Profile: Recent Labs    09/17/20 0316  CHOL 259*  HDL 43  LDLCALC 142*  TRIG 370*  CHOLHDL 6.0   Thyroid Function Tests: No results for input(s): TSH, T4TOTAL, FREET4, T3FREE, THYROIDAB in the last 72 hours. Anemia Panel: No results for input(s): VITAMINB12, FOLATE, FERRITIN, TIBC, IRON, RETICCTPCT in the last 72 hours. Sepsis Labs: No results for input(s): PROCALCITON, LATICACIDVEN in the last 168 hours.  Recent Results (from the past 240 hour(s))  Resp Panel by RT-PCR (Flu A&B, Covid) Nasopharyngeal Swab     Status: None   Collection Time: 09/16/20  8:13 AM   Specimen: Nasopharyngeal Swab; Nasopharyngeal(NP) swabs in vial transport medium  Result Value Ref Range Status   SARS Coronavirus 2 by RT PCR NEGATIVE NEGATIVE Final    Comment: (NOTE) SARS-CoV-2 target nucleic acids are NOT DETECTED.  The SARS-CoV-2 RNA is generally detectable in upper respiratory specimens during the acute phase of infection. The lowest concentration of SARS-CoV-2 viral copies this assay can detect is 138 copies/mL. A negative result does not preclude SARS-Cov-2 infection and should not be used as the sole basis for treatment or other patient management decisions. A negative result may occur with  improper  specimen collection/handling, submission of specimen other than nasopharyngeal swab, presence of viral mutation(s) within the areas targeted by this assay, and inadequate number of viral copies(<138 copies/mL). A negative result must be combined with clinical observations, patient history, and epidemiological information. The expected result is Negative.  Fact Sheet for Patients:  EntrepreneurPulse.com.au  Fact Sheet for Healthcare Providers:  IncredibleEmployment.be  This test is no t yet approved or cleared by the [place] FDA and  has been authorized for detection and/or diagnosis of SARS-CoV-2 by FDA under an Emergency Use Authorization (EUA). This EUA will remain  in effect (meaning this test can be used) for the duration of the COVID-19 declaration under Section 564(b)(1) of the Act, 21 U.S.C.section 360bbb-3(b)(1), unless the authorization is terminated  or revoked sooner.  Influenza A by PCR NEGATIVE NEGATIVE Final   Influenza B by PCR NEGATIVE NEGATIVE Final    Comment: (NOTE) The Xpert Xpress SARS-CoV-2/FLU/RSV plus assay is intended as an aid in the diagnosis of influenza from Nasopharyngeal swab specimens and should not be used as a sole basis for treatment. Nasal washings and aspirates are unacceptable for Xpert Xpress SARS-CoV-2/FLU/RSV testing.  Fact Sheet for Patients: EntrepreneurPulse.com.au  Fact Sheet for Healthcare Providers: IncredibleEmployment.be  This test is not yet approved or cleared by the [place] FDA and has been authorized for detection and/or diagnosis of SARS-CoV-2 by FDA under an Emergency Use Authorization (EUA). This EUA will remain in effect (meaning this test can be used) for the duration of the COVID-19 declaration under Section 564(b)(1) of the Act, 21 U.S.C. section 360bbb-3(b)(1), unless the authorization is terminated or revoked.  Performed at  [facility], [address], [place], [place] [address]       Radiology Studies: DG Chest 2 View  Result Date: 09/16/2020 CLINICAL DATA:  80 year old female with history of chest pain and shortness of breath. EXAM: CHEST - 2 VIEW COMPARISON:  Chest x-ray 08/22/2020. FINDINGS: Lung volumes are normal. No acute consolidative airspace disease. No pleural effusions. Chronic scarring and volume loss in the right middle lobe, similar to prior examinations. No pneumothorax. No evidence of pulmonary edema. No definite suspicious appearing pulmonary nodule or mass. Heart size is mildly enlarged. Upper mediastinal contours are within normal limits. Aortic atherosclerosis. IMPRESSION: 1. No radiographic evidence of acute cardiopulmonary disease. 2. Chronic scarring and volume loss in the right middle lobe, stable compared to prior examinations. 3. Cardiomegaly. 4. Aortic atherosclerosis. Electronically Signed   By: Vinnie Langton M.D.   On: 09/16/2020 07:09   NM Pulmonary Perfusion  Result Date: 09/16/2020 CLINICAL DATA:  Suspected. EXAM: NUCLEAR MEDICINE PERFUSION LUNG SCAN TECHNIQUE: Perfusion images were obtained in multiple projections after intravenous injection of radiopharmaceutical. Ventilation scans intentionally deferred if perfusion scan and chest x-ray adequate for interpretation during COVID 19 epidemic. RADIOPHARMACEUTICALS:  4.3 mCi Tc-63m MAA IV COMPARISON:  Chest x-ray 09/16/2020. FINDINGS: Cardiomegaly. No prominent perfusion defects noted to suggest pulmonary embolus IMPRESSION: No prominent perfusion defects noted to suggest pulmonary embolus. Electronically Signed   By: Marcello Moores  Register   On: 09/16/2020 13:01   US Venous Img Lower Bilateral (DVT)  Result Date: 09/16/2020 CLINICAL DATA:  Positive D-dimer EXAM: BILATERAL LOWER EXTREMITY VENOUS DOPPLER ULTRASOUND TECHNIQUE: Gray-scale sonography with compression, as well as color and duplex ultrasound, were performed to evaluate the  deep venous system(s) from the level of the common femoral vein through the popliteal and proximal calf veins. COMPARISON:  None. FINDINGS: VENOUS Normal compressibility of the common femoral, superficial femoral, and popliteal veins, as well as the visualized calf veins. Visualized portions of profunda femoral vein and great saphenous vein unremarkable. No filling defects to suggest DVT on grayscale or color Doppler imaging. Doppler waveforms show normal direction of venous flow, normal respiratory plasticity and response to augmentation. OTHER None. Limitations: none IMPRESSION: No lower extremity DVT Electronically Signed   By: Miachel Roux M.D.   On: 09/16/2020 13:45   ECHOCARDIOGRAM COMPLETE  Result Date: 09/16/2020    ECHOCARDIOGRAM REPORT   Patient Name:   BOBBI YOUNT Date of Exam: 09/16/2020 Medical Rec #:  [id number]         Height:       65.0 in Accession #:    [id number]        Weight:  213.4 lb Date of Birth:  06-03-1940        BSA:          2.033 m Patient Age:    11 years          BP:           157/86 mmHg Patient Gender: F                 HR:           117 bpm. Exam Location:  [facility] Procedure: 2D Echo, Cardiac Doppler and Color Doppler Indications:     Chest pain R07.9  History:         Patient has prior history of Echocardiogram examinations, most                  recent 11/09/2018. COPD; Risk Factors:Hypertension. CKD.  Sonographer:     Sherrie Sport RDCS (AE) Referring Phys:  Baker Janus Soledad Gerlach NIU Diagnosing Phys: Nelva Bush MD  Sonographer Comments: Suboptimal apical window. Image acquisition challenging due to COPD. IMPRESSIONS  1. Left ventricular ejection fraction, by estimation, is 25 to 30%. The left ventricle has severely decreased function. The left ventricle demonstrates regional wall motion abnormalities (see scoring diagram/findings for description). The left ventricular internal cavity size was mildly dilated. There is mild left ventricular hypertrophy. Left ventricular diastolic  parameters are indeterminate. There is severe hypokinesis of the left ventricular, entire anteroseptal wall, anterior wall and anterolateral wall. There is severe hypokinesis of the left ventricular, apical inferior segment and apical segment.  2. Right ventricular systolic function is normal. The right ventricular size is normal. Moderately increased right ventricular wall thickness. Tricuspid regurgitation signal is inadequate for assessing PA pressure.  3. A small pericardial effusion is present. The pericardial effusion is anterior to the right ventricle.  4. The mitral valve is abnormal. No evidence of mitral valve regurgitation. Moderate mitral annular calcification.  5. The aortic valve was not well visualized. Aortic valve regurgitation is not visualized. No aortic stenosis is present. FINDINGS  Left Ventricle: Left ventricular ejection fraction, by estimation, is 25 to 30%. The left ventricle has severely decreased function. The left ventricle demonstrates regional wall motion abnormalities. Severe hypokinesis of the left ventricular, entire anteroseptal wall, anterior wall and anterolateral wall. Severe hypokinesis of the left ventricular, apical inferior segment and apical segment. The left ventricular internal cavity size was mildly dilated. There is mild left ventricular hypertrophy. Left ventricular diastolic parameters are indeterminate. Right Ventricle: The right ventricular size is normal. Moderately increased right ventricular wall thickness. Right ventricular systolic function is normal. Tricuspid regurgitation signal is inadequate for assessing PA pressure. Left Atrium: Left atrial size was normal in size. Right Atrium: Right atrial size was normal in size. Pericardium: A small pericardial effusion is present. The pericardial effusion is anterior to the right ventricle. Mitral Valve: The mitral valve is abnormal. Moderate mitral annular calcification. No evidence of mitral valve regurgitation.  Tricuspid Valve: The tricuspid valve is not well visualized. Tricuspid valve regurgitation is trivial. Aortic Valve: The aortic valve was not well visualized. Aortic valve regurgitation is not visualized. No aortic stenosis is present. Aortic valve mean gradient measures 5.7 mmHg. Aortic valve peak gradient measures 10.2 mmHg. Aortic valve area, by VTI measures 1.84 cm. Pulmonic Valve: The pulmonic valve was not well visualized. Pulmonic valve regurgitation is not visualized. No evidence of pulmonic stenosis. Aorta: The aortic root is normal in size and structure. Pulmonary Artery: The pulmonary artery is not well  seen. IAS/Shunts: The interatrial septum was not well visualized.  LEFT VENTRICLE PLAX 2D LVIDd:         5.69 cm      Diastology LVIDs:         4.67 cm      LV e' medial:    2.50 cm/s LV PW:         0.81 cm      LV E/e' medial:  59.6 LV IVS:        1.11 cm      LV e' lateral:   9.46 cm/s LVOT diam:     2.00 cm      LV E/e' lateral: 15.8 LV SV:         45 LV SV Index:   22 LVOT Area:     3.14 cm  LV Volumes (MOD) LV vol d, MOD A2C: 124.0 ml LV vol d, MOD A4C: 127.0 ml LV vol s, MOD A2C: 80.3 ml LV vol s, MOD A4C: 110.0 ml LV SV MOD A2C:     43.7 ml LV SV MOD A4C:     127.0 ml LV SV MOD BP:      35.5 ml RIGHT VENTRICLE RV S prime:     12.80 cm/s TAPSE (M-mode): 3.3 cm LEFT ATRIUM             Index       RIGHT ATRIUM          Index LA diam:        4.00 cm 1.97 cm/m  RA Area:     9.67 cm LA Vol (A2C):   50.2 ml 24.69 ml/m RA Volume:   20.20 ml 9.93 ml/m LA Vol (A4C):   49.1 ml 24.15 ml/m LA Biplane Vol: 52.5 ml 25.82 ml/m  AORTIC VALVE AV Area (Vmax):    1.55 cm AV Area (Vmean):   1.35 cm AV Area (VTI):     1.84 cm AV Vmax:           160.00 cm/s AV Vmean:          107.667 cm/s AV VTI:            0.246 m AV Peak Grad:      10.2 mmHg AV Mean Grad:      5.7 mmHg LVOT Vmax:         79.00 cm/s LVOT Vmean:        46.100 cm/s LVOT VTI:          0.144 m LVOT/AV VTI ratio: 0.59  AORTA Ao Root diam: 2.60 cm  MITRAL VALVE MV Area (PHT): 11.67 cm    SHUNTS MV Decel Time: 65 msec      Systemic VTI:  0.14 m MV E velocity: 149.00 cm/s  Systemic Diam: 2.00 cm Nelva Bush MD Electronically signed by Nelva Bush MD Signature Date/Time: 09/16/2020/2:39:42 PM    Final      Scheduled Meds:  aspirin EC  81 mg Oral Daily   atorvastatin  40 mg Oral QPM   busPIRone  5 mg Oral BID   carvedilol  6.25 mg Oral BID WC   cholecalciferol  5,000 Units Oral Daily   clopidogrel  75 mg Oral Daily   umeclidinium bromide  1 puff Inhalation Daily   And   fluticasone furoate-vilanterol  1 puff Inhalation Daily   insulin aspart  0-15 Units Subcutaneous TID WC   insulin aspart  0-5 Units Subcutaneous QHS   insulin detemir  30  Units Subcutaneous QHS   ipratropium-albuterol  3 mL Nebulization TID   nitroGLYCERIN  1 inch Topical Q6H   oxyCODONE-acetaminophen  1 tablet Oral QHS   pantoprazole  40 mg Oral Daily   torsemide  20 mg Oral Daily   traZODone  50 mg Oral QHS   vitamin B-12  1,000 mcg Oral Daily   Continuous Infusions:  heparin 1,900 Units/hr (09/17/20 2345)     LOS: 1 day     Enzo Bi, MD [facility] If 7PM-7AM, please contact night-coverage 09/18/2020, 1:24 AM

## 2020-09-17 NOTE — Consult Note (Signed)
ANTICOAGULATION CONSULT NOTE - Initial Consult  Pharmacy Consult for heparin drip Indication: chest pain/ACS  Allergies  Allergen Reactions   Other Rash    Pt reports allergy to metals.    Patient Measurements: Height: 5\' 5"  (165.1 cm) Weight: 93.8 kg (206 lb 12.7 oz) IBW/kg (Calculated) : 57 Heparin Dosing Weight: 78.9kg  Vital Signs: Temp: 98.4 F (36.9 C) (07/06 2004) Temp Source: Oral (07/06 2004) BP: 119/66 (07/06 2004) Pulse Rate: 98 (07/06 2004)  Labs: Recent Labs    09/16/20 6283 09/16/20 0813 09/16/20 0819 09/16/20 1143 09/16/20 1447 09/16/20 1653 09/17/20 0316 09/17/20 1149 09/17/20 2145  HGB 10.2*  --   --   --   --   --  9.7*  --   --   HCT 33.9*  --   --   --   --   --  32.5*  --   --   PLT 365  --   --   --   --   --  304  --   --   APTT  --  27  --   --   --   --   --   --   --   LABPROT  --  13.1  --   --   --   --   --   --   --   INR  --  1.0  --   --   --   --   --   --   --   HEPARINUNFRC  --   --   --   --   --    < > 0.19* 0.21* 0.21*  CREATININE 2.17*  --   --   --   --   --  2.20*  --   --   TROPONINIHS 241*  --  391* 1,416* 1,290*  --   --   --   --    < > = values in this interval not displayed.     Estimated Creatinine Clearance: 23.9 mL/min (A) (by C-G formula based on SCr of 2.2 mg/dL (H)).   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  Estimates quit ~ 2015   GERD (gastroesophageal reflux disease)    HTN (hypertension)    Hyperlipidemia with target LDL less than 70    Hypothyroidism    subclinical. low TSH, normal thyroid panel. biopsy 2010   Vitamin B12 deficiency     Medications:  No PTA anticoagulant of record  80 yo female with chest pain and elevated troponin.  Pharmacy has been consulted to initiate and monitor a heparin drip.  7/5 1653 HL <0.10 1000 > 1300  7/6 0316 HL = 0.19, subtherapeutic @ 1300 units/hr 7/6 1149 HL 0.21  7/6 2145 HL 0.21, subtherapeutic @  1750 units/hr   Goal of Therapy:  Heparin level 0.3-0.7 units/ml Monitor platelets by anticoagulation protocol: Yes   Plan:  7/6:  HL @ 2145 = 0.21 Will order Heparin 1200 units IV X 1 and increase drip rate to 1900 units/hr.  Will recheck HL 8 hrs after rate change.   Orene Desanctis, PharmD Clinical Pharmacist 09/17/2020 11:11 PM

## 2020-09-17 NOTE — Consult Note (Signed)
ANTICOAGULATION CONSULT NOTE - Initial Consult  Pharmacy Consult for heparin drip Indication: chest pain/ACS  Allergies  Allergen Reactions   Other Rash    Pt reports allergy to metals.    Patient Measurements: Height: 5\' 5"  (165.1 cm) Weight: 93.8 kg (206 lb 12.7 oz) IBW/kg (Calculated) : 57 Heparin Dosing Weight: 78.9kg  Vital Signs: Temp: 98.1 F (36.7 C) (07/06 1138) Temp Source: Oral (07/06 1138) BP: 132/78 (07/06 1138) Pulse Rate: 93 (07/06 1138)  Labs: Recent Labs    09/16/20 5852 09/16/20 0813 09/16/20 0819 09/16/20 1143 09/16/20 1447 09/16/20 1653 09/17/20 0316 09/17/20 1149  HGB 10.2*  --   --   --   --   --  9.7*  --   HCT 33.9*  --   --   --   --   --  32.5*  --   PLT 365  --   --   --   --   --  304  --   APTT  --  27  --   --   --   --   --   --   LABPROT  --  13.1  --   --   --   --   --   --   INR  --  1.0  --   --   --   --   --   --   HEPARINUNFRC  --   --   --   --   --  <0.10* 0.19* 0.21*  CREATININE 2.17*  --   --   --   --   --  2.20*  --   TROPONINIHS 241*  --  391* 1,416* 1,290*  --   --   --      Estimated Creatinine Clearance: 23.9 mL/min (A) (by C-G formula based on SCr of 2.2 mg/dL (H)).   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 Estimates quit ~ 2015   GERD (gastroesophageal reflux disease)    HTN (hypertension)    Hyperlipidemia with  target LDL less than 70    Hypothyroidism    subclinical. low TSH, normal thyroid panel. biopsy 2010   Vitamin B12 deficiency     Medications:  No PTA anticoagulant of record  80 yo female with chest pain and elevated troponin.  Pharmacy has been consulted to initiate and monitor a heparin drip.  7/5 1653 HL <0.10 1000 > 1300  7/6 0316 HL = 0.19, subtherapeutic @ 1300 units/hr 7/6 1149 HL 0.21    Goal of Therapy:  Heparin level 0.3-0.7 units/ml Monitor platelets by anticoagulation protocol: Yes   Plan:  Heparin level is subtherapeutic. Will give heparin bolus of 1200 units/hr and increase the heparin infusion to 1750 units/hr. Recheck heparin level in 8 hours. CBC  daily while on heparin.   Oswald Hillock, PharmD Clinical Pharmacist 09/17/2020 12:39 PM

## 2020-09-17 NOTE — Consult Note (Signed)
ANTICOAGULATION CONSULT NOTE - Initial Consult  Pharmacy Consult for heparin drip Indication: chest pain/ACS  Allergies  Allergen Reactions   Other Rash    Pt reports allergy to metals.    Patient Measurements: Height: 5\' 5"  (165.1 cm) Weight: 93.8 kg (206 lb 12.7 oz) IBW/kg (Calculated) : 57 Heparin Dosing Weight: 78.9kg  Vital Signs: Temp: 98 F (36.7 C) (07/06 0109) BP: 160/92 (07/06 0109) Pulse Rate: 107 (07/06 0109)  Labs: Recent Labs    09/16/20 5284 09/16/20 0813 09/16/20 0819 09/16/20 1143 09/16/20 1447 09/16/20 1653 09/17/20 0316  HGB 10.2*  --   --   --   --   --  9.7*  HCT 33.9*  --   --   --   --   --  32.5*  PLT 365  --   --   --   --   --  304  APTT  --  27  --   --   --   --   --   LABPROT  --  13.1  --   --   --   --   --   INR  --  1.0  --   --   --   --   --   HEPARINUNFRC  --   --   --   --   --  <0.10* 0.19*  CREATININE 2.17*  --   --   --   --   --  2.20*  TROPONINIHS 241*  --  391* 1,416* 1,290*  --   --      Estimated Creatinine Clearance: 23.9 mL/min (A) (by C-G formula based on SCr of 2.2 mg/dL (H)).   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 Estimates quit ~ 2015   GERD (gastroesophageal reflux disease)    HTN (hypertension)    Hyperlipidemia with target LDL less than 70    Hypothyroidism    subclinical. low TSH, normal thyroid panel.  biopsy 2010   Vitamin B12 deficiency     Medications:  No PTA anticoagulant of record  80 yo female with chest pain and elevated troponin.  Pharmacy has been consulted to initiate and monitor a heparin drip.  7/5 1653 HL <0.10 1000 > 1300  7/6 0316 HL = 0.19, subtherapeutic @ 1300 units/hr   Goal of Therapy:  Heparin level 0.3-0.7 units/ml Monitor platelets by anticoagulation protocol: Yes   Plan:  7/6:  HL @ 0316 = 0.19, subtherapeutic Will order heparin 2300 units IV X 1 bolus and increase drip rate to 1600 units/hr.  Will recheck HL 8 hrs after rate change.   Orene Desanctis, PharmD Clinical Pharmacist 09/17/2020 3:52 AM

## 2020-09-17 NOTE — Progress Notes (Signed)
Progress Note  Patient Name: Mckenzie Taylor Date of Encounter: 09/17/2020  [facility] Cardiologist: Ida Rogue, MD   Subjective   Seen this morning sleeping comfortably, denies chest pain or shortness of breath.  Has ambulated to bathroom without any issues.  Wants to eat.  Will like to hold off on cardiac catheterization for now.  Inpatient Medications    Scheduled Meds:  aspirin EC  81 mg Oral Daily   atorvastatin  40 mg Oral QPM   busPIRone  5 mg Oral BID   carvedilol  3.125 mg Oral BID WC   cholecalciferol  5,000 Units Oral Daily   clopidogrel  75 mg Oral Daily   umeclidinium bromide  1 puff Inhalation Daily   And   fluticasone furoate-vilanterol  1 puff Inhalation Daily   insulin aspart  0-15 Units Subcutaneous TID WC   insulin aspart  0-5 Units Subcutaneous QHS   insulin detemir  30 Units Subcutaneous QHS   ipratropium-albuterol  3 mL Nebulization TID   nitroGLYCERIN  1 inch Topical Q6H   oxyCODONE-acetaminophen  1 tablet Oral QHS   pantoprazole  40 mg Oral Daily   torsemide  20 mg Oral Daily   traZODone  50 mg Oral QHS   vitamin B-12  1,000 mcg Oral Daily   Continuous Infusions:  heparin 1,750 Units/hr (09/17/20 1300)   PRN Meds: acetaminophen, albuterol, dextromethorphan-guaiFENesin, hydrALAZINE, morphine injection, nitroGLYCERIN, ondansetron (ZOFRAN) IV, promethazine, tiZANidine   Vital Signs    Vitals:   09/17/20 0403 09/17/20 0725 09/17/20 0740 09/17/20 1138  BP: (!) 143/96 138/88  132/78  Pulse: (!) 107 100  93  Resp:  17  17  Temp: 97.9 F (36.6 C) 98.1 F (36.7 C)  98.1 F (36.7 C)  TempSrc: Oral Oral  Oral  SpO2: 99% 98% 98% 98%  Weight:      Height:        Intake/Output Summary (Last 24 hours) at 09/17/2020 1346 Last data filed at 09/17/2020 1219 Gross per 24 hour  Intake 240 ml  Output 900 ml  Net -660 ml   Last 3 Weights 09/16/2020 09/16/2020 08/22/2020  Weight (lbs) 206 lb 12.7 oz 213 lb 6.5 oz 201 lb  Weight (kg) 93.8 kg 96.8  kg 91.173 kg      Telemetry    Sinus tachycardia, heart rate 101- Personally Reviewed  ECG    No new tracing reviewed- Personally Reviewed  Physical Exam   GEN: No acute distress.   Neck: No JVD Cardiac: RRR, no murmurs, rubs, or gallops.  Respiratory: Diminished breath sounds at bases GI: Soft, nontender, non-distended  MS: No edema; No deformity. Neuro:  Nonfocal  Psych: Normal affect   Labs    High Sensitivity Troponin:   Recent Labs  Lab 08/22/20 2335 09/16/20 0642 09/16/20 0819 09/16/20 1143 09/16/20 1447  TROPONINIHS 60* 241* 391* 1,416* 1,290*      Chemistry Recent Labs  Lab 09/16/20 0642 09/17/20 0316  NA 133* 134*  K 5.6* 5.5*  CL 98 98  CO2 26 31  GLUCOSE 506* 298*  BUN 19 23  CREATININE 2.17* 2.20*  CALCIUM 9.0 8.9  PROT 6.6  --   ALBUMIN 3.3*  --   AST 29  --   ALT 14  --   ALKPHOS 59  --   BILITOT 0.3  --   GFRNONAA 23* 22*  ANIONGAP 9 5     Hematology Recent Labs  Lab 09/16/20 0642 09/17/20 0316  WBC 11.6*  12.8*  RBC 3.80* 3.60*  HGB 10.2* 9.7*  HCT 33.9* 32.5*  MCV 89.2 90.3  MCH 26.8 26.9  MCHC 30.1 29.8*  RDW 16.3* 16.1*  PLT 365 304    BNP Recent Labs  Lab 09/16/20 1447  BNP 978.5*     DDimer  Recent Labs  Lab 09/16/20 0642  DDIMER 0.67*     Radiology    DG Chest 2 View  Result Date: 09/16/2020 CLINICAL DATA:  80 year old female with history of chest pain and shortness of breath. EXAM: CHEST - 2 VIEW COMPARISON:  Chest x-ray 08/22/2020. FINDINGS: Lung volumes are normal. No acute consolidative airspace disease. No pleural effusions. Chronic scarring and volume loss in the right middle lobe, similar to prior examinations. No pneumothorax. No evidence of pulmonary edema. No definite suspicious appearing pulmonary nodule or mass. Heart size is mildly enlarged. Upper mediastinal contours are within normal limits. Aortic atherosclerosis. IMPRESSION: 1. No radiographic evidence of acute cardiopulmonary disease. 2.  Chronic scarring and volume loss in the right middle lobe, stable compared to prior examinations. 3. Cardiomegaly. 4. Aortic atherosclerosis. Electronically Signed   By: Vinnie Langton M.D.   On: 09/16/2020 07:09   NM Pulmonary Perfusion  Result Date: 09/16/2020 CLINICAL DATA:  Suspected. EXAM: NUCLEAR MEDICINE PERFUSION LUNG SCAN TECHNIQUE: Perfusion images were obtained in multiple projections after intravenous injection of radiopharmaceutical. Ventilation scans intentionally deferred if perfusion scan and chest x-ray adequate for interpretation during COVID 19 epidemic. RADIOPHARMACEUTICALS:  4.3 mCi Tc-84m MAA IV COMPARISON:  Chest x-ray 09/16/2020. FINDINGS: Cardiomegaly. No prominent perfusion defects noted to suggest pulmonary embolus IMPRESSION: No prominent perfusion defects noted to suggest pulmonary embolus. Electronically Signed   By: Marcello Moores  Register   On: 09/16/2020 13:01   US Venous Img Lower Bilateral (DVT)  Result Date: 09/16/2020 CLINICAL DATA:  Positive D-dimer EXAM: BILATERAL LOWER EXTREMITY VENOUS DOPPLER ULTRASOUND TECHNIQUE: Gray-scale sonography with compression, as well as color and duplex ultrasound, were performed to evaluate the deep venous system(s) from the level of the common femoral vein through the popliteal and proximal calf veins. COMPARISON:  None. FINDINGS: VENOUS Normal compressibility of the common femoral, superficial femoral, and popliteal veins, as well as the visualized calf veins. Visualized portions of profunda femoral vein and great saphenous vein unremarkable. No filling defects to suggest DVT on grayscale or color Doppler imaging. Doppler waveforms show normal direction of venous flow, normal respiratory plasticity and response to augmentation. OTHER None. Limitations: none IMPRESSION: No lower extremity DVT Electronically Signed   By: Miachel Roux M.D.   On: 09/16/2020 13:45   ECHOCARDIOGRAM COMPLETE  Result Date: 09/16/2020    ECHOCARDIOGRAM REPORT    Patient Name:   Mckenzie Taylor Date of Exam: 09/16/2020 Medical Rec #:  [id number]         Height:       65.0 in Accession #:    [id number]        Weight:       213.4 lb Date of Birth:  December 13, 1940        BSA:          2.033 m Patient Age:    80 years          BP:           157/86 mmHg Patient Gender: F                 HR:           117 bpm. Exam Location:  [place]  Procedure: 2D Echo, Cardiac Doppler and Color Doppler Indications:     Chest pain R07.9  History:         Patient has prior history of Echocardiogram examinations, most                  recent 11/09/2018. COPD; Risk Factors:Hypertension. CKD.  Sonographer:     Sherrie Sport RDCS (AE) Referring Phys:  Baker Janus Soledad Gerlach NIU Diagnosing Phys: Nelva Bush MD  Sonographer Comments: Suboptimal apical window. Image acquisition challenging due to COPD. IMPRESSIONS  1. Left ventricular ejection fraction, by estimation, is 25 to 30%. The left ventricle has severely decreased function. The left ventricle demonstrates regional wall motion abnormalities (see scoring diagram/findings for description). The left ventricular internal cavity size was mildly dilated. There is mild left ventricular hypertrophy. Left ventricular diastolic parameters are indeterminate. There is severe hypokinesis of the left ventricular, entire anteroseptal wall, anterior wall and anterolateral wall. There is severe hypokinesis of the left ventricular, apical inferior segment and apical segment.  2. Right ventricular systolic function is normal. The right ventricular size is normal. Moderately increased right ventricular wall thickness. Tricuspid regurgitation signal is inadequate for assessing PA pressure.  3. A small pericardial effusion is present. The pericardial effusion is anterior to the right ventricle.  4. The mitral valve is abnormal. No evidence of mitral valve regurgitation. Moderate mitral annular calcification.  5. The aortic valve was not well visualized. Aortic valve regurgitation is not  visualized. No aortic stenosis is present. FINDINGS  Left Ventricle: Left ventricular ejection fraction, by estimation, is 25 to 30%. The left ventricle has severely decreased function. The left ventricle demonstrates regional wall motion abnormalities. Severe hypokinesis of the left ventricular, entire anteroseptal wall, anterior wall and anterolateral wall. Severe hypokinesis of the left ventricular, apical inferior segment and apical segment. The left ventricular internal cavity size was mildly dilated. There is mild left ventricular hypertrophy. Left ventricular diastolic parameters are indeterminate. Right Ventricle: The right ventricular size is normal. Moderately increased right ventricular wall thickness. Right ventricular systolic function is normal. Tricuspid regurgitation signal is inadequate for assessing PA pressure. Left Atrium: Left atrial size was normal in size. Right Atrium: Right atrial size was normal in size. Pericardium: A small pericardial effusion is present. The pericardial effusion is anterior to the right ventricle. Mitral Valve: The mitral valve is abnormal. Moderate mitral annular calcification. No evidence of mitral valve regurgitation. Tricuspid Valve: The tricuspid valve is not well visualized. Tricuspid valve regurgitation is trivial. Aortic Valve: The aortic valve was not well visualized. Aortic valve regurgitation is not visualized. No aortic stenosis is present. Aortic valve mean gradient measures 5.7 mmHg. Aortic valve peak gradient measures 10.2 mmHg. Aortic valve area, by VTI measures 1.84 cm. Pulmonic Valve: The pulmonic valve was not well visualized. Pulmonic valve regurgitation is not visualized. No evidence of pulmonic stenosis. Aorta: The aortic root is normal in size and structure. Pulmonary Artery: The pulmonary artery is not well seen. IAS/Shunts: The interatrial septum was not well visualized.  LEFT VENTRICLE PLAX 2D LVIDd:         5.69 cm      Diastology LVIDs:          4.67 cm      LV e' medial:    2.50 cm/s LV PW:         0.81 cm      LV E/e' medial:  59.6 LV IVS:        1.11 cm  LV e' lateral:   9.46 cm/s LVOT diam:     2.00 cm      LV E/e' lateral: 15.8 LV SV:         45 LV SV Index:   22 LVOT Area:     3.14 cm  LV Volumes (MOD) LV vol d, MOD A2C: 124.0 ml LV vol d, MOD A4C: 127.0 ml LV vol s, MOD A2C: 80.3 ml LV vol s, MOD A4C: 110.0 ml LV SV MOD A2C:     43.7 ml LV SV MOD A4C:     127.0 ml LV SV MOD BP:      35.5 ml RIGHT VENTRICLE RV S prime:     12.80 cm/s TAPSE (M-mode): 3.3 cm LEFT ATRIUM             Index       RIGHT ATRIUM          Index LA diam:        4.00 cm 1.97 cm/m  RA Area:     9.67 cm LA Vol (A2C):   50.2 ml 24.69 ml/m RA Volume:   20.20 ml 9.93 ml/m LA Vol (A4C):   49.1 ml 24.15 ml/m LA Biplane Vol: 52.5 ml 25.82 ml/m  AORTIC VALVE AV Area (Vmax):    1.55 cm AV Area (Vmean):   1.35 cm AV Area (VTI):     1.84 cm AV Vmax:           160.00 cm/s AV Vmean:          107.667 cm/s AV VTI:            0.246 m AV Peak Grad:      10.2 mmHg AV Mean Grad:      5.7 mmHg LVOT Vmax:         79.00 cm/s LVOT Vmean:        46.100 cm/s LVOT VTI:          0.144 m LVOT/AV VTI ratio: 0.59  AORTA Ao Root diam: 2.60 cm MITRAL VALVE MV Area (PHT): 11.67 cm    SHUNTS MV Decel Time: 65 msec      Systemic VTI:  0.14 m MV E velocity: 149.00 cm/s  Systemic Diam: 2.00 cm Nelva Bush MD Electronically signed by Nelva Bush MD Signature Date/Time: 09/16/2020/2:39:42 PM    Final     Cardiac Studies   TTEcho 09/16/2020 1. Left ventricular ejection fraction, by estimation, is 25 to 30%. The  left ventricle has severely decreased function. The left ventricle  demonstrates regional wall motion abnormalities (see scoring  diagram/findings for description). The left  ventricular internal cavity size was mildly dilated. There is mild left  ventricular hypertrophy. Left ventricular diastolic parameters are  indeterminate. There is severe hypokinesis of the left  ventricular, entire  anteroseptal wall, anterior wall and  anterolateral wall. There is severe hypokinesis of the left ventricular,  apical inferior segment and apical segment.   2. Right ventricular systolic function is normal. The right ventricular  size is normal. Moderately increased right ventricular wall thickness.  Tricuspid regurgitation signal is inadequate for assessing PA pressure.   3. A small pericardial effusion is present. The pericardial effusion is  anterior to the right ventricle.   4. The mitral valve is abnormal. No evidence of mitral valve  regurgitation. Moderate mitral annular calcification.   5. The aortic valve was not well visualized. Aortic valve regurgitation  is not visualized. No aortic stenosis is present.  Patient Profile     80 y.o. female with history of hypertension, hyperlipidemia, COPD, CKD 4 presenting with chest pain and elevated troponins.  Diagnosed with NSTEMI, and new severely reduced LV function  Assessment & Plan    Chest pain, NSTEMI -Troponins peaked at 1416 -Continue heparin drip x48 hours total -Patient continues to decline left heart cath -Medical management with aspirin, Plavix. -Plan left heart cath with limited contrast if patient is agreeable.  2.  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  EF 25 to 30% -Increase Coreg to 6.25 mg twice daily -If BP tolerates, consider starting BiDil tomorrow. -Avoiding ACE/ARB/Arni due to renal dysfunction. -Continue torsemide 20 mg daily.  Creatinine stable.  3.  CKD stage IV -Continue to avoid nephrotoxic's  Total encounter time 35 minutes  Greater than 50% was spent in counseling and coordination of care with the patient       Signed, Kate Sable, MD  09/17/2020, 1:46 PM

## 2020-09-18 ENCOUNTER — Telehealth: Payer: Self-pay | Admitting: Cardiovascular Disease

## 2020-09-18 DIAGNOSIS — N1832 Chronic kidney disease, stage 3b: Secondary | ICD-10-CM

## 2020-09-18 DIAGNOSIS — I25118 Atherosclerotic heart disease of native coronary artery with other forms of angina pectoris: Secondary | ICD-10-CM

## 2020-09-18 DIAGNOSIS — I420 Dilated cardiomyopathy: Secondary | ICD-10-CM

## 2020-09-18 DIAGNOSIS — E1122 Type 2 diabetes mellitus with diabetic chronic kidney disease: Secondary | ICD-10-CM

## 2020-09-18 LAB — HEPARIN LEVEL (UNFRACTIONATED): Heparin Unfractionated: 0.26 IU/mL — ABNORMAL LOW (ref 0.30–0.70)

## 2020-09-18 LAB — CBC
HCT: 33.5 % — ABNORMAL LOW (ref 36.0–46.0)
HCT: 32.1 % — ABNORMAL LOW (ref 36.0–46.0)
Hemoglobin: 9.8 g/dL — ABNORMAL LOW (ref 12.0–15.0)
Hemoglobin: 9.4 g/dL — ABNORMAL LOW (ref 12.0–15.0)
MCH: 26.9 pg (ref 26.0–34.0)
MCH: 26.9 pg (ref 26.0–34.0)
MCHC: 29.3 g/dL — ABNORMAL LOW (ref 30.0–36.0)
MCHC: 29.3 g/dL — ABNORMAL LOW (ref 30.0–36.0)
MCV: 92 fL (ref 80.0–100.0)
MCV: 91.7 fL (ref 80.0–100.0)
Platelets: 315 10*3/uL (ref 150–400)
Platelets: 300 10*3/uL (ref 150–400)
RBC: 3.64 MIL/uL — ABNORMAL LOW (ref 3.87–5.11)
RBC: 3.5 MIL/uL — ABNORMAL LOW (ref 3.87–5.11)
RDW: 16.7 % — ABNORMAL HIGH (ref 11.5–15.5)
RDW: 16.6 % — ABNORMAL HIGH (ref 11.5–15.5)
WBC: 9.9 10*3/uL (ref 4.0–10.5)
WBC: 9.6 10*3/uL (ref 4.0–10.5)
nRBC: 0 % (ref 0.0–0.2)
nRBC: 0.2 % (ref 0.0–0.2)

## 2020-09-18 LAB — BASIC METABOLIC PANEL
Anion gap: 8 (ref 5–15)
BUN: 28 mg/dL — ABNORMAL HIGH (ref 8–23)
CO2: 31 mmol/L (ref 22–32)
Calcium: 8.7 mg/dL — ABNORMAL LOW (ref 8.9–10.3)
Chloride: 99 mmol/L (ref 98–111)
Creatinine, Ser: 2.48 mg/dL — ABNORMAL HIGH (ref 0.44–1.00)
GFR, Estimated: 19 mL/min — ABNORMAL LOW (ref >60)
Glucose, Bld: 206 mg/dL — ABNORMAL HIGH (ref 70–99)
Potassium: 4.7 mmol/L (ref 3.5–5.1)
Sodium: 138 mmol/L (ref 135–145)

## 2020-09-18 LAB — MAGNESIUM: Magnesium: 2.3 mg/dL (ref 1.7–2.4)

## 2020-09-18 LAB — GLUCOSE, CAPILLARY
Glucose-Capillary: 240 mg/dL — ABNORMAL HIGH (ref 70–99)
Glucose-Capillary: 186 mg/dL — ABNORMAL HIGH (ref 70–99)

## 2020-09-18 MED ORDER — ISOSORB DINITRATE-HYDRALAZINE 20-37.5 MG PO TABS: 0.5000 | ORAL_TABLET | Freq: Three times a day (TID) | ORAL | 0 refills | Status: AC

## 2020-09-18 MED ORDER — CLOPIDOGREL BISULFATE 75 MG PO TABS: 75.0000 mg | ORAL_TABLET | Freq: Every day | ORAL | 0 refills | Status: AC

## 2020-09-18 MED ORDER — ISOSORB DINITRATE-HYDRALAZINE 20-37.5 MG PO TABS
0.5000 | ORAL_TABLET | Freq: Three times a day (TID) | ORAL | Status: DC
  Administered 2020-09-18: 0.5 via ORAL
  Filled 2020-09-18 (×3): qty 0.5

## 2020-09-18 MED ORDER — HEPARIN SODIUM (PORCINE) 5000 UNIT/ML IJ SOLN
5000.0000 [IU] | Freq: Three times a day (TID) | INTRAMUSCULAR | Status: DC
  Administered 2020-09-18: 5000 [IU] via SUBCUTANEOUS
  Filled 2020-09-18: qty 1

## 2020-09-18 MED ORDER — IPRATROPIUM-ALBUTEROL 0.5-2.5 (3) MG/3ML IN SOLN: 3.0000 mL | Freq: Two times a day (BID) | RESPIRATORY_TRACT | Status: DC

## 2020-09-18 NOTE — Progress Notes (Signed)
Inpatient Diabetes Program Recommendations  AACE/ADA: New Consensus Statement on Inpatient Glycemic Control (2015)  Target Ranges:  Prepandial:   less than 140 mg/dL      Peak postprandial:   less than 180 mg/dL (1-2 hours)      Critically ill patients:  140 - 180 mg/dL   Lab Results  Component Value Date   GLUCAP 240 (H) 09/18/2020   HGBA1C 8.4 (H) 09/16/2020    Review of Glycemic Control Results for Mckenzie Taylor, Mckenzie Taylor (MRN [id number]) as of 09/18/2020 11:32  Ref. Range 09/17/2020 07:24 09/17/2020 11:38 09/17/2020 16:11 09/17/2020 21:13 09/18/2020 07:43  Glucose-Capillary Latest Ref Range: 70 - 99 mg/dL 269 (H) 138 (H) 247 (H) 338 (H) 240 (H)   Diabetes history: DM2 Outpatient Diabetes medications: Lantus 20 + Nov 5 tid Current orders for Inpatient glycemic control: Levemir 30 units + Novolog 0-15 units tid + hs 0-5 units  Inpatient Diabetes Program Recommendations:   -Add Novolog 3 units tid meal coverage if eats 50% -Decrease Novolog correction to 0-9 units tid + hs 0-5 units Secure chat sent to Dr. Billie Ruddy.  Thank you, Nani Gasser. Harbor Vanover, RN, MSN, CDE  Diabetes Coordinator Inpatient Glycemic Control Team Team Pager [phone] (8am-5pm) 09/18/2020 11:34 AM

## 2020-09-18 NOTE — Telephone Encounter (Signed)
-----   Message from Rise Mu, PA-C sent at 09/18/2020  2:29 PM EDT ----- Please schedule patient for hospital follow-up to be seen in 2 weeks.  Thanks!

## 2020-09-18 NOTE — Telephone Encounter (Signed)
Called patient. Unable to leave message. VM full. °

## 2020-09-18 NOTE — Discharge Summary (Signed)
Physician Discharge Summary   Mckenzie Taylor  female DOB: 1940-11-28  ZJI:[id number]  PCP: Mckenzie Lank, MD  Admit date: 09/16/2020 Discharge date: 09/18/2020  Admitted From: home Disposition:  home CODE STATUS: DNR  Discharge Instructions     Discharge instructions   Complete by: As directed    You have had a small heart attack.  Since you don't want the heart craterization, cardiology is medically managing your coronary artery disease.  Please take your medications as prescribed.  Please follow up with cardiology as outpatient.   Dr. Enzo Bi [facility] Course:  For full details, please see H&P, progress notes, consult notes and ancillary notes.  Briefly,  Mckenzie Taylor is a 80 y.o. female with medical history significant of HTN, HLD, DM, COPD on 2L of O2, GERD, dCHF, CAD, CKD-IV, former smoker, depression, anemia, who presented with chest pain.   Chest pain, and NSTEMI  hx of CAD -Troponins peaked at 1416 --s/p heparin gtt for 48 hours -Patient declined left heart cath -Medical management with aspirin, Plavix. --Pt will follow up with cardiology outpatient.   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 -Continue torsemide 20 mg daily.    GERD  --cont PPI   COPD (chronic obstructive pulmonary disease) ([place]): Stable. --cont DuoNeb and inhalers   HTN (hypertension) --increase coreg to 6.25 mg BID -started on BiDil -Continue torsemide 20 mg daily.    HLD (hyperlipidemia) -Lipitor   Type II diabetes mellitus with renal manifestations [facility]):  Recent A1c 8.3, poorly controlled.  Patient taking Humalog and Levemir at home.  Initial blood sugar 506, which improved to 294 after giving 10 unit NovoLog IV.  Anion gap 9, no DKA. -Pt received Sliding scale insulin and Levemir 30u nightly while inpatient.  Discharged  back to home regimen (see below)   CKD (chronic kidney disease), stage IV [facility]): Renal function close to baseline.  Recent baseline creatinine 1.7-2.3.    Hyperkalemia, resolved -s/p Lokelma   Discharge Diagnoses:  Principal Problem:   NSTEMI (non-ST elevated myocardial infarction) ([place]) Active Problems:   GERD (gastroesophageal reflux disease)   COPD (chronic obstructive pulmonary disease) ([facility])   Chest pain   HTN (hypertension)   HLD (hyperlipidemia)   Type II diabetes mellitus with renal manifestations ([facility])   CKD (chronic kidney disease), stage IV ([facility])   CAD (coronary artery disease)   Chronic diastolic CHF (congestive heart failure) ([facility])   Hyperkalemia   Positive D dimer   HFrEF (heart failure with reduced ejection fraction) ([place])   30 Day Unplanned Readmission Risk Score    Flowsheet Row ED to Hosp-Admission (Current) from 09/16/2020 in [facility]  30 Day Unplanned Readmission Risk Score (%) 25.83 Filed at 09/18/2020 1200       This score is the patient's risk of an unplanned readmission within 30 days of being discharged (0 -100%). The score is based on dignosis, age, lab data, medications, orders, and past utilization.   Low:  0-14.9   Medium: 15-21.9   High: 22-29.9   Extreme: 30 and above          Discharge Instructions:  Allergies as of 09/18/2020       Reactions   Other Rash   Pt reports allergy to metals.        Medication List  STOP taking these medications    amLODipine 10 MG tablet Commonly known as: NORVASC   FLUoxetine 40 MG capsule Commonly known as: PROZAC       TAKE these medications    albuterol 108 (90 Base) MCG/ACT inhaler Commonly known as: VENTOLIN HFA Inhale 2 puffs into the lungs every 4 (four) hours as needed.   aspirin 81 MG EC tablet Take 1 tablet (81 mg total) by mouth daily.   atorvastatin 40 MG tablet Commonly known as: LIPITOR Take 40 mg by mouth every evening.   busPIRone 5 MG  tablet Commonly known as: BUSPAR Take 5 mg by mouth 2 (two) times daily.   carvedilol 6.25 MG tablet Commonly known as: COREG Take 1 tablet (6.25 mg total) by mouth 2 (two) times daily with a meal. What changed: when to take this   clopidogrel 75 MG tablet Commonly known as: PLAVIX Take 1 tablet (75 mg total) by mouth daily. Start taking on: September 19, 2020   Cyanocobalamin 1500 MCG Tbdp Take 3 tablets by mouth daily.   Fluticasone-Umeclidin-Vilant 100-62.5-25 MCG/INH Aepb Inhale 1 puff into the lungs daily.   HumaLOG KwikPen 100 UNIT/ML KwikPen Generic drug: insulin lispro Inject 0-22 Units into the skin 3 (three) times daily with meals. Sliding scale   ipratropium-albuterol 0.5-2.5 (3) MG/3ML Soln Commonly known as: DUONEB Take 3 mLs by nebulization 4 (four) times daily.   isosorbide-hydrALAZINE 20-37.5 MG tablet Commonly known as: BIDIL Take 0.5 tablets by mouth 3 (three) times daily.   Levemir FlexTouch 100 UNIT/ML FlexPen Generic drug: insulin detemir Inject 43 Units into the skin at bedtime.   metolazone 2.5 MG tablet Commonly known as: ZAROXOLYN Take 1 tablet (2.5 mg total) by mouth daily as needed (for weight gain > 3 pounds in 1 day).   omeprazole 20 MG capsule Commonly known as: PRILOSEC Take 20 mg by mouth daily.   oxyCODONE-acetaminophen 5-325 MG tablet Commonly known as: PERCOCET/ROXICET Take 1 tablet by mouth at bedtime. What changed: Another medication with the same name was removed. Continue taking this medication, and follow the directions you see here.   promethazine 25 MG tablet Commonly known as: PHENERGAN Take 25 mg by mouth every 6 (six) hours as needed for nausea.   tiZANidine 4 MG tablet Commonly known as: ZANAFLEX Take 4 mg by mouth 3 (three) times daily as needed for muscle spasms.   torsemide 20 MG tablet Commonly known as: DEMADEX Take 1 tablet (20 mg total) by mouth daily.   traZODone 50 MG tablet Commonly known as: DESYREL Take  50 mg by mouth at bedtime.   Vitamin D3 125 MCG (5000 UT) Caps Take 1 capsule by mouth daily.         Follow-up Information     Mckenzie Lank, MD Follow up in 1 week(s).   Specialty: Family Medicine Contact information: [address] [address] [phone]         Minna Merritts, MD .   Specialty: Cardiology Contact information: [place] [address] [phone]                 Allergies  Allergen Reactions   Other Rash    Pt reports allergy to metals.     The results of significant diagnostics from this hospitalization (including imaging, microbiology, ancillary and laboratory) are listed below for reference.   Consultations:   Procedures/Studies: DG Chest 2 View  Result Date: 09/16/2020 CLINICAL DATA:  80 year old  female with history of chest pain and shortness of breath. EXAM: CHEST - 2 VIEW COMPARISON:  Chest x-ray 08/22/2020. FINDINGS: Lung volumes are normal. No acute consolidative airspace disease. No pleural effusions. Chronic scarring and volume loss in the right middle lobe, similar to prior examinations. No pneumothorax. No evidence of pulmonary edema. No definite suspicious appearing pulmonary nodule or mass. Heart size is mildly enlarged. Upper mediastinal contours are within normal limits. Aortic atherosclerosis. IMPRESSION: 1. No radiographic evidence of acute cardiopulmonary disease. 2. Chronic scarring and volume loss in the right middle lobe, stable compared to prior examinations. 3. Cardiomegaly. 4. Aortic atherosclerosis. Electronically Signed   By: Vinnie Langton M.D.   On: 09/16/2020 07:09   NM Pulmonary Perfusion  Result Date: 09/16/2020 CLINICAL DATA:  Suspected. EXAM: NUCLEAR MEDICINE PERFUSION LUNG SCAN TECHNIQUE: Perfusion images were obtained in multiple projections after intravenous injection of radiopharmaceutical. Ventilation scans intentionally deferred if perfusion scan  and chest x-ray adequate for interpretation during COVID 19 epidemic. RADIOPHARMACEUTICALS:  4.3 mCi Tc-53m MAA IV COMPARISON:  Chest x-ray 09/16/2020. FINDINGS: Cardiomegaly. No prominent perfusion defects noted to suggest pulmonary embolus IMPRESSION: No prominent perfusion defects noted to suggest pulmonary embolus. Electronically Signed   By: Marcello Moores  Register   On: 09/16/2020 13:01   US Venous Img Lower Bilateral (DVT)  Result Date: 09/16/2020 CLINICAL DATA:  Positive D-dimer EXAM: BILATERAL LOWER EXTREMITY VENOUS DOPPLER ULTRASOUND TECHNIQUE: Gray-scale sonography with compression, as well as color and duplex ultrasound, were performed to evaluate the deep venous system(s) from the level of the common femoral vein through the popliteal and proximal calf veins. COMPARISON:  None. FINDINGS: VENOUS Normal compressibility of the common femoral, superficial femoral, and popliteal veins, as well as the visualized calf veins. Visualized portions of profunda femoral vein and great saphenous vein unremarkable. No filling defects to suggest DVT on grayscale or color Doppler imaging. Doppler waveforms show normal direction of venous flow, normal respiratory plasticity and response to augmentation. OTHER None. Limitations: none IMPRESSION: No lower extremity DVT Electronically Signed   By: Miachel Roux M.D.   On: 09/16/2020 13:45   DG Chest Portable 1 View  Result Date: 08/22/2020 CLINICAL DATA:  Chest pain EXAM: PORTABLE CHEST 1 VIEW COMPARISON:  11/08/2018 FINDINGS: Cardiomegaly. Scarring in the lung bases. Mild hyperinflation. No effusions or overt edema. No acute bony abnormality. IMPRESSION: Cardiomegaly, COPD. Bibasilar scarring. No active disease. Electronically Signed   By: Rolm Baptise M.D.   On: 08/22/2020 21:36   ECHOCARDIOGRAM COMPLETE  Result Date: 09/16/2020    ECHOCARDIOGRAM REPORT   Patient Name:   DILIA ALEMANY Date of Exam: 09/16/2020 Medical Rec #:  [id number]         Height:       65.0 in  Accession #:    [id number]        Weight:       213.4 lb Date of Birth:  1941/01/14        BSA:          2.033 m Patient Age:    80 years          BP:           157/86 mmHg Patient Gender: F                 HR:           117 bpm. Exam Location:  [facility] Procedure: 2D Echo, Cardiac Doppler and Color Doppler Indications:     Chest pain R07.9  History:         Patient has prior history of Echocardiogram examinations, most                  recent 11/09/2018. COPD; Risk Factors:Hypertension. CKD.  Sonographer:     Sherrie Sport RDCS (AE) Referring Phys:  Baker Janus Soledad Gerlach NIU Diagnosing Phys: Nelva Bush MD  Sonographer Comments: Suboptimal apical window. Image acquisition challenging due to COPD. IMPRESSIONS  1. Left ventricular ejection fraction, by estimation, is 25 to 30%. The left ventricle has severely decreased function. The left ventricle demonstrates regional wall motion abnormalities (see scoring diagram/findings for description). The left ventricular internal cavity size was mildly dilated. There is mild left ventricular hypertrophy. Left ventricular diastolic parameters are indeterminate. There is severe hypokinesis of the left ventricular, entire anteroseptal wall, anterior wall and anterolateral wall. There is severe hypokinesis of the left ventricular, apical inferior segment and apical segment.  2. Right ventricular systolic function is normal. The right ventricular size is normal. Moderately increased right ventricular wall thickness. Tricuspid regurgitation signal is inadequate for assessing PA pressure.  3. A small pericardial effusion is present. The pericardial effusion is anterior to the right ventricle.  4. The mitral valve is abnormal. No evidence of mitral valve regurgitation. Moderate mitral annular calcification.  5. The aortic valve was not well visualized. Aortic valve regurgitation is not visualized. No aortic stenosis is present. FINDINGS  Left Ventricle: Left ventricular ejection fraction, by  estimation, is 25 to 30%. The left ventricle has severely decreased function. The left ventricle demonstrates regional wall motion abnormalities. Severe hypokinesis of the left ventricular, entire anteroseptal wall, anterior wall and anterolateral wall. Severe hypokinesis of the left ventricular, apical inferior segment and apical segment. The left ventricular internal cavity size was mildly dilated. There is mild left ventricular hypertrophy. Left ventricular diastolic parameters are indeterminate. Right Ventricle: The right ventricular size is normal. Moderately increased right ventricular wall thickness. Right ventricular systolic function is normal. Tricuspid regurgitation signal is inadequate for assessing PA pressure. Left Atrium: Left atrial size was normal in size. Right Atrium: Right atrial size was normal in size. Pericardium: A small pericardial effusion is present. The pericardial effusion is anterior to the right ventricle. Mitral Valve: The mitral valve is abnormal. Moderate mitral annular calcification. No evidence of mitral valve regurgitation. Tricuspid Valve: The tricuspid valve is not well visualized. Tricuspid valve regurgitation is trivial. Aortic Valve: The aortic valve was not well visualized. Aortic valve regurgitation is not visualized. No aortic stenosis is present. Aortic valve mean gradient measures 5.7 mmHg. Aortic valve peak gradient measures 10.2 mmHg. Aortic valve area, by VTI measures 1.84 cm. Pulmonic Valve: The pulmonic valve was not well visualized. Pulmonic valve regurgitation is not visualized. No evidence of pulmonic stenosis. Aorta: The aortic root is normal in size and structure. Pulmonary Artery: The pulmonary artery is not well seen. IAS/Shunts: The interatrial septum was not well visualized.  LEFT VENTRICLE PLAX 2D LVIDd:         5.69 cm      Diastology LVIDs:         4.67 cm      LV e' medial:    2.50 cm/s LV PW:         0.81 cm      LV E/e' medial:  59.6 LV IVS:         1.11 cm      LV e' lateral:   9.46 cm/s LVOT diam:     2.00 cm  LV E/e' lateral: 15.8 LV SV:         45 LV SV Index:   22 LVOT Area:     3.14 cm  LV Volumes (MOD) LV vol d, MOD A2C: 124.0 ml LV vol d, MOD A4C: 127.0 ml LV vol s, MOD A2C: 80.3 ml LV vol s, MOD A4C: 110.0 ml LV SV MOD A2C:     43.7 ml LV SV MOD A4C:     127.0 ml LV SV MOD BP:      35.5 ml RIGHT VENTRICLE RV S prime:     12.80 cm/s TAPSE (M-mode): 3.3 cm LEFT ATRIUM             Index       RIGHT ATRIUM          Index LA diam:        4.00 cm 1.97 cm/m  RA Area:     9.67 cm LA Vol (A2C):   50.2 ml 24.69 ml/m RA Volume:   20.20 ml 9.93 ml/m LA Vol (A4C):   49.1 ml 24.15 ml/m LA Biplane Vol: 52.5 ml 25.82 ml/m  AORTIC VALVE AV Area (Vmax):    1.55 cm AV Area (Vmean):   1.35 cm AV Area (VTI):     1.84 cm AV Vmax:           160.00 cm/s AV Vmean:          107.667 cm/s AV VTI:            0.246 m AV Peak Grad:      10.2 mmHg AV Mean Grad:      5.7 mmHg LVOT Vmax:         79.00 cm/s LVOT Vmean:        46.100 cm/s LVOT VTI:          0.144 m LVOT/AV VTI ratio: 0.59  AORTA Ao Root diam: 2.60 cm MITRAL VALVE MV Area (PHT): 11.67 cm    SHUNTS MV Decel Time: 65 msec      Systemic VTI:  0.14 m MV E velocity: 149.00 cm/s  Systemic Diam: 2.00 cm Nelva Bush MD Electronically signed by Nelva Bush MD Signature Date/Time: 09/16/2020/2:39:42 PM    Final       Labs: BNP (last 3 results) Recent Labs    08/22/20 2335 09/16/20 1447  BNP 125.3* 476.5*   Basic Metabolic Panel: Recent Labs  Lab 09/16/20 0642 09/16/20 1653 09/17/20 0316 09/18/20 0437  NA 133*  --  134* 138  K 5.6*  --  5.5* 4.7  CL 98  --  98 99  CO2 26  --  31 31  GLUCOSE 506*  --  298* 206*  BUN 19  --  23 28*  CREATININE 2.17*  --  2.20* 2.48*  CALCIUM 9.0  --  8.9 8.7*  MG  --  2.6*  --  2.3   Liver Function Tests: Recent Labs  Lab 09/16/20 0642  AST 29  ALT 14  ALKPHOS 59  BILITOT 0.3  PROT 6.6  ALBUMIN 3.3*   Recent Labs  Lab 09/16/20 0642   LIPASE 37   No results for input(s): AMMONIA in the last 168 hours. CBC: Recent Labs  Lab 09/16/20 0642 09/17/20 0316 09/18/20 0437 09/18/20 0910  WBC 11.6* 12.8* 9.9 9.6  HGB 10.2* 9.7* 9.8* 9.4*  HCT 33.9* 32.5* 33.5* 32.1*  MCV 89.2 90.3 92.0 91.7  PLT 365 304 315 300   Cardiac Enzymes: No  results for input(s): CKTOTAL, CKMB, CKMBINDEX, TROPONINI in the last 168 hours. BNP: Invalid input(s): POCBNP CBG: Recent Labs  Lab 09/17/20 1138 09/17/20 1611 09/17/20 2113 09/18/20 0743 09/18/20 1145  GLUCAP 138* 247* 338* 240* 186*   D-Dimer Recent Labs    09/16/20 0642  DDIMER 0.67*   Hgb A1c Recent Labs    09/16/20 0819  HGBA1C 8.4*   Lipid Profile Recent Labs    09/17/20 0316  CHOL 259*  HDL 43  LDLCALC 142*  TRIG 370*  CHOLHDL 6.0   Thyroid function studies No results for input(s): TSH, T4TOTAL, T3FREE, THYROIDAB in the last 72 hours.  Invalid input(s): FREET3 Anemia work up No results for input(s): VITAMINB12, FOLATE, FERRITIN, TIBC, IRON, RETICCTPCT in the last 72 hours. Urinalysis    Component Value Date/Time   COLORURINE Amber 10/21/2012 1145   APPEARANCEUR Cloudy 10/21/2012 1145   LABSPEC 1.015 10/21/2012 1145   PHURINE 6.0 10/21/2012 1145   GLUCOSEU Negative 10/21/2012 1145   HGBUR 1+ 10/21/2012 1145   BILIRUBINUR Negative 10/21/2012 1145   KETONESUR Negative 10/21/2012 1145   PROTEINUR >=500 10/21/2012 1145   NITRITE Positive 10/21/2012 1145   LEUKOCYTESUR 3+ 10/21/2012 1145   Sepsis Labs Invalid input(s): PROCALCITONIN,  WBC,  LACTICIDVEN Microbiology Recent Results (from the past 240 hour(s))  Resp Panel by RT-PCR (Flu A&B, Covid) Nasopharyngeal Swab     Status: None   Collection Time: 09/16/20  8:13 AM   Specimen: Nasopharyngeal Swab; Nasopharyngeal(NP) swabs in vial transport medium  Result Value Ref Range Status   SARS Coronavirus 2 by RT PCR NEGATIVE NEGATIVE Final    Comment: (NOTE) SARS-CoV-2 target nucleic acids are NOT  DETECTED.  The SARS-CoV-2 RNA is generally detectable in upper respiratory specimens during the acute phase of infection. The lowest concentration of SARS-CoV-2 viral copies this assay can detect is 138 copies/mL. A negative result does not preclude SARS-Cov-2 infection and should not be used as the sole basis for treatment or other patient management decisions. A negative result may occur with  improper specimen collection/handling, submission of specimen other than nasopharyngeal swab, presence of viral mutation(s) within the areas targeted by this assay, and inadequate number of viral copies(<138 copies/mL). A negative result must be combined with clinical observations, patient history, and epidemiological information. The expected result is Negative.  Fact Sheet for Patients:  EntrepreneurPulse.com.au  Fact Sheet for Healthcare Providers:  IncredibleEmployment.be  This test is no t yet approved or cleared by the [place] FDA and  has been authorized for detection and/or diagnosis of SARS-CoV-2 by FDA under an Emergency Use Authorization (EUA). This EUA will remain  in effect (meaning this test can be used) for the duration of the COVID-19 declaration under Section 564(b)(1) of the Act, 21 U.S.C.section 360bbb-3(b)(1), unless the authorization is terminated  or revoked sooner.       Influenza A by PCR NEGATIVE NEGATIVE Final   Influenza B by PCR NEGATIVE NEGATIVE Final    Comment: (NOTE) The Xpert Xpress SARS-CoV-2/FLU/RSV plus assay is intended as an aid in the diagnosis of influenza from Nasopharyngeal swab specimens and should not be used as a sole basis for treatment. Nasal washings and aspirates are unacceptable for Xpert Xpress SARS-CoV-2/FLU/RSV testing.  Fact Sheet for Patients: EntrepreneurPulse.com.au  Fact Sheet for Healthcare Providers: IncredibleEmployment.be  This test is not yet  approved or cleared by the [place] FDA and has been authorized for detection and/or diagnosis of SARS-CoV-2 by FDA under an Emergency Use Authorization (EUA). This  EUA will remain in effect (meaning this test can be used) for the duration of the COVID-19 declaration under Section 564(b)(1) of the Act, 21 U.S.C. section 360bbb-3(b)(1), unless the authorization is terminated or revoked.  Performed at [facility], [address] [place], [place] [address]      Total time spend on discharging this patient, including the last patient exam, discussing the hospital stay, instructions for ongoing care as it relates to all pertinent caregivers, as well as preparing the medical discharge records, prescriptions, and/or referrals as applicable, is 35 minutes.    Enzo Bi, MD  [facility] 09/18/2020, 2:36 PM

## 2020-09-18 NOTE — Plan of Care (Signed)

## 2020-09-18 NOTE — Progress Notes (Signed)
Progress Note  Patient Name: Mckenzie Taylor Date of Encounter: 09/18/2020  Primary Cardiologist: Rockey Situ  Subjective   No further chest pain.  No dyspnea.  No palpitations, dizziness, presyncope, or syncope.  Up on the side of the bed eating breakfast.  Indicates that she has not "felt this great" in a long time.  Wants to go home.  She has completed 48 hours of heparin infusion.  She continues to decline cardiac cath.  Hemoglobin remains low though is overall stable.  Hyperkalemia is improved.  Renal function is slightly worse today.  Vitals are stable.  Inpatient Medications    Scheduled Meds:  aspirin EC  81 mg Oral Daily   atorvastatin  40 mg Oral QPM   busPIRone  5 mg Oral BID   carvedilol  6.25 mg Oral BID WC   cholecalciferol  5,000 Units Oral Daily   clopidogrel  75 mg Oral Daily   umeclidinium bromide  1 puff Inhalation Daily   And   fluticasone furoate-vilanterol  1 puff Inhalation Daily   heparin  5,000 Units Subcutaneous Q8H   insulin aspart  0-15 Units Subcutaneous TID WC   insulin aspart  0-5 Units Subcutaneous QHS   insulin detemir  30 Units Subcutaneous QHS   ipratropium-albuterol  3 mL Nebulization BID   isosorbide-hydrALAZINE  0.5 tablet Oral TID   oxyCODONE-acetaminophen  1 tablet Oral QHS   pantoprazole  40 mg Oral Daily   torsemide  20 mg Oral Daily   traZODone  50 mg Oral QHS   vitamin B-12  1,000 mcg Oral Daily   Continuous Infusions:  PRN Meds: acetaminophen, albuterol, alum & mag hydroxide-simeth, dextromethorphan-guaiFENesin, hydrALAZINE, lidocaine, nitroGLYCERIN, ondansetron (ZOFRAN) IV, promethazine, tiZANidine   Vital Signs    Vitals:   09/18/20 0500 09/18/20 0739 09/18/20 0744 09/18/20 1144  BP:   (!) 143/76 138/79  Pulse:   96 100  Resp:   17 17  Temp:   98.1 F (36.7 C) 98 F (36.7 C)  TempSrc:   Axillary   SpO2:  98% 99% 97%  Weight: 94.1 kg     Height:        Intake/Output Summary (Last 24 hours) at 09/18/2020 1304 Last  data filed at 09/18/2020 1241 Gross per 24 hour  Intake 1982.88 ml  Output 1625 ml  Net 357.88 ml   Filed Weights   09/16/20 0633 09/16/20 2000 09/18/20 0500  Weight: 96.8 kg 93.8 kg 94.1 kg    Telemetry    SR - Personally Reviewed  ECG    No new tracings - Personally Reviewed  Physical Exam   GEN: No acute distress.   Neck: No JVD. Cardiac: RRR, no murmurs, rubs, or gallops.  Respiratory: Diminished breath sounds along the bases bilaterally.  GI: Soft, nontender, non-distended.   MS: No edema; No deformity. Neuro:  Alert and oriented x 3; Nonfocal.  Psych: Normal affect.  Labs    Chemistry Recent Labs  Lab 09/16/20 [phone] 09/17/20 0316 09/18/20 0437  NA 133* 134* 138  K 5.6* 5.5* 4.7  CL 98 98 99  CO2 26 31 31   GLUCOSE 506* 298* 206*  BUN 19 23 28*  CREATININE 2.17* 2.20* 2.48*  CALCIUM 9.0 8.9 8.7*  PROT 6.6  --   --   ALBUMIN 3.3*  --   --   AST 29  --   --   ALT 14  --   --   ALKPHOS 59  --   --  BILITOT 0.3  --   --   GFRNONAA 23* 22* 19*  ANIONGAP 9 5 8      Hematology Recent Labs  Lab 09/17/20 0316 09/18/20 0437 09/18/20 0910  WBC 12.8* 9.9 9.6  RBC 3.60* 3.64* 3.50*  HGB 9.7* 9.8* 9.4*  HCT 32.5* 33.5* 32.1*  MCV 90.3 92.0 91.7  MCH 26.9 26.9 26.9  MCHC 29.8* 29.3* 29.3*  RDW 16.1* 16.7* 16.6*  PLT 304 315 300    Cardiac EnzymesNo results for input(s): TROPONINI in the last 168 hours. No results for input(s): TROPIPOC in the last 168 hours.   BNP Recent Labs  Lab 09/16/20 1447  BNP 978.5*     DDimer  Recent Labs  Lab 09/16/20 0642  DDIMER 0.67*     Radiology    US Venous Img Lower Bilateral (DVT)  Result Date: 09/16/2020 IMPRESSION: No lower extremity DVT Electronically Signed   By: Sharen Heck  Mir M.D.   On: 09/16/2020 13:45    Cardiac Studies   2D echo 09/16/2020: 1. Left ventricular ejection fraction, by estimation, is 25 to 30%. The  left ventricle has severely decreased function. The left ventricle  demonstrates  regional wall motion abnormalities (see scoring  diagram/findings for description). The left  ventricular internal cavity size was mildly dilated. There is mild left  ventricular hypertrophy. Left ventricular diastolic parameters are  indeterminate. There is severe hypokinesis of the left ventricular, entire  anteroseptal wall, anterior wall and  anterolateral wall. There is severe hypokinesis of the left ventricular,  apical inferior segment and apical segment.   2. Right ventricular systolic function is normal. The right ventricular  size is normal. Moderately increased right ventricular wall thickness.  Tricuspid regurgitation signal is inadequate for assessing PA pressure.   3. A small pericardial effusion is present. The pericardial effusion is  anterior to the right ventricle.   4. The mitral valve is abnormal. No evidence of mitral valve  regurgitation. Moderate mitral annular calcification.   5. The aortic valve was not well visualized. Aortic valve regurgitation  is not visualized. No aortic stenosis is present. __________  2D echo 11/09/2018: 1. The left ventricle has low normal systolic function, with an ejection  fraction of 50-55%. The cavity size was normal. There is mildly increased  left ventricular wall thickness. Left ventricular diastolic Doppler  parameters are consistent with  impaired relaxation.   2. The right ventricle has normal systolic function. The cavity was  normal. There is no increase in right ventricular wall thickness. Right  ventricular systolic pressure is normal with an estimated pressure of 26.2  mmHg.   3. Left atrial size was mildly dilated. __________  2D echo 08/15/2016: 1. The left ventricle has low normal systolic function, with an ejection  fraction of 50-55%. The cavity size was normal. There is mildly increased  left ventricular wall thickness. Left ventricular diastolic Doppler  parameters are consistent with  impaired relaxation.    2. The right ventricle has normal systolic function. The cavity was  normal. There is no increase in right ventricular wall thickness. Right  ventricular systolic pressure is normal with an estimated pressure of 26.2  mmHg.   3. Left atrial size was mildly dilated.  Patient Profile     80 y.o. female with history of CKD stage IV, anemia of chronic disease, chronic hypoxic respiratory failure on supplemental oxygen via nasal cannula at 3 L, COPD, DM2, HTN, and HLD admitted with an NSTEMI and acute HFrEF.  Assessment & Plan  1.  NSTEMI: -Currently chest pain-free -Troponin peaked at 1416 -She has completed 48 hours of heparin infusion as of the morning of 7/7 with subsequent discontinuation of drip -Throughout this admission she has declined LHC, and continues to decline this today -Continue medical management with ASA, Plavix, atorvastatin, and carvedilol -Remote cath in 2004 demonstrated nonobstructive disease -Outpatient follow-up  2.  Acute HFrEF: -Likely ischemic in etiology -She has declined ischemic evaluation as outlined above, this will be revisited in outpatient follow-up -Continue current medical therapy including carvedilol, BiDil, and torsemide -Not on ACEi/ARB/ARNI/MRA secondary to CKD  3.  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  Anemia of chronic disease: -Hgb low, though stable  5.  Hyperkalemia: -Improved  6.  DM: -A1c 8.4 -Outpatient follow-up with PCP  7.  Chronic hypoxic respiratory failure secondary to COPD: -Stable -On baseline supplemental oxygen    For questions or updates, please contact [place] Please consult [email or web address] for contact info under Cardiology/STEMI.    Signed, Christell Faith, PA-C [place] Pager: [phone] 09/18/2020, 1:04 PM

## 2020-09-18 NOTE — Consult Note (Signed)
   Heart Failure Nurse Navigator Note  HFrEF 25-30%.  Mild LVH.  Severe hypokinesis of the anterior septal wall, anterolateral wall and anterior wall.  Normal right ventricular systolic function.  Comorbidities:  Hypertension Hyperlipidemia Diabetes COPD on chronic use of O2 at 2 L GERD Coronary artery disease Chronic kidney disease stage IV Depression Anemia  Medications:  Aspirin 81 mg daily Atorvastatin 40 mg daily Carvedilol 6.25 mg 2 times a day with meals Plavix 75 mg daily Isosorbide/hydralazine 20/37.5 mg 3 times a day Torsemide 20 mg daily  Presented with complaints of chest pain, non-ST elevation MI with troponins peaking at 1416.  Patient has declined cath.  Met with patient today, she denies any further chest pain or increasing shortness of breath.  Discussed heart failure, she explained that she knows that her squeeze of her heart is not as strong as it should be.  She weighs herself daily at home and record and has been given previous rescue diuretic orders if she gains weight.  Also talked about fluid restriction of 64 ounces in a days time and sodium restriction.  She states that she likes to snack on Fritos but will only eat a few not anywhere near the 36 chips that equals 170 mg of sodium per serving.  Discussed following up with the heart failure clinic has been given an appointment time.  She was also given the living with heart failure teaching booklet, along with low-sodium diet information, etc.  Pricilla Riffle RN [facility]

## 2020-09-29 NOTE — Progress Notes (Deleted)
   Patient ID: Mckenzie Taylor, female    DOB: 15-Oct-1940, 80 y.o.   MRN: [id number]  HPI  [name] is a 80 y/o female with a history of  Echo report from 09/16/20 reviewed and showed an EF of 25-30%.  Admitted 09/16/20 due to chest pain due to NSTEMI. Cardiology consult obtained. Placed initially on heparin drip which was then stopped as patient declined catheterization. HF medications adjusted. Discharged after 2 days.   She presents today for her initial visit with a chief complaint of   Review of Systems    Physical Exam  Assessment & Plan:  1: Chronic heart failure with reduced ejection fraction- - NYHA class - sees cardiology Rockey Situ) 10/10/20 - BNP 09/16/20 was 978.5  2: HTN with CKD- - BP - BMP 09/18/20 reviewed and showed sodium 138, potassium 4.7, creatinine 2.48 and GFR 19  3: DM- - A1c 09/16/20 was 8.4%  4: COPD-

## 2020-09-30 ENCOUNTER — Ambulatory Visit: Payer: Medicare Other | Admitting: Family

## 2020-10-09 NOTE — Progress Notes (Deleted)
No show

## 2020-10-10 ENCOUNTER — Ambulatory Visit: Payer: Medicare Other | Admitting: Cardiovascular Disease

## 2020-10-10 DIAGNOSIS — I5043 Acute on chronic combined systolic (congestive) and diastolic (congestive) heart failure: Secondary | ICD-10-CM

## 2020-10-10 DIAGNOSIS — I25118 Atherosclerotic heart disease of native coronary artery with other forms of angina pectoris: Secondary | ICD-10-CM

## 2020-10-10 DIAGNOSIS — I739 Peripheral vascular disease, unspecified: Secondary | ICD-10-CM

## 2020-10-10 DIAGNOSIS — I5021 Acute systolic (congestive) heart failure: Secondary | ICD-10-CM

## 2020-10-10 DIAGNOSIS — I10 Essential (primary) hypertension: Secondary | ICD-10-CM

## 2020-10-11 IMAGING — DX DG CHEST 1V PORT
1 series · 1 of 1 positions shown · non-contrast
Comparison: 01/06/2017.

CLINICAL DATA: Shortness of breath.  Wheezing.

EXAM:
PORTABLE CHEST 1 VIEW

[chest ap]
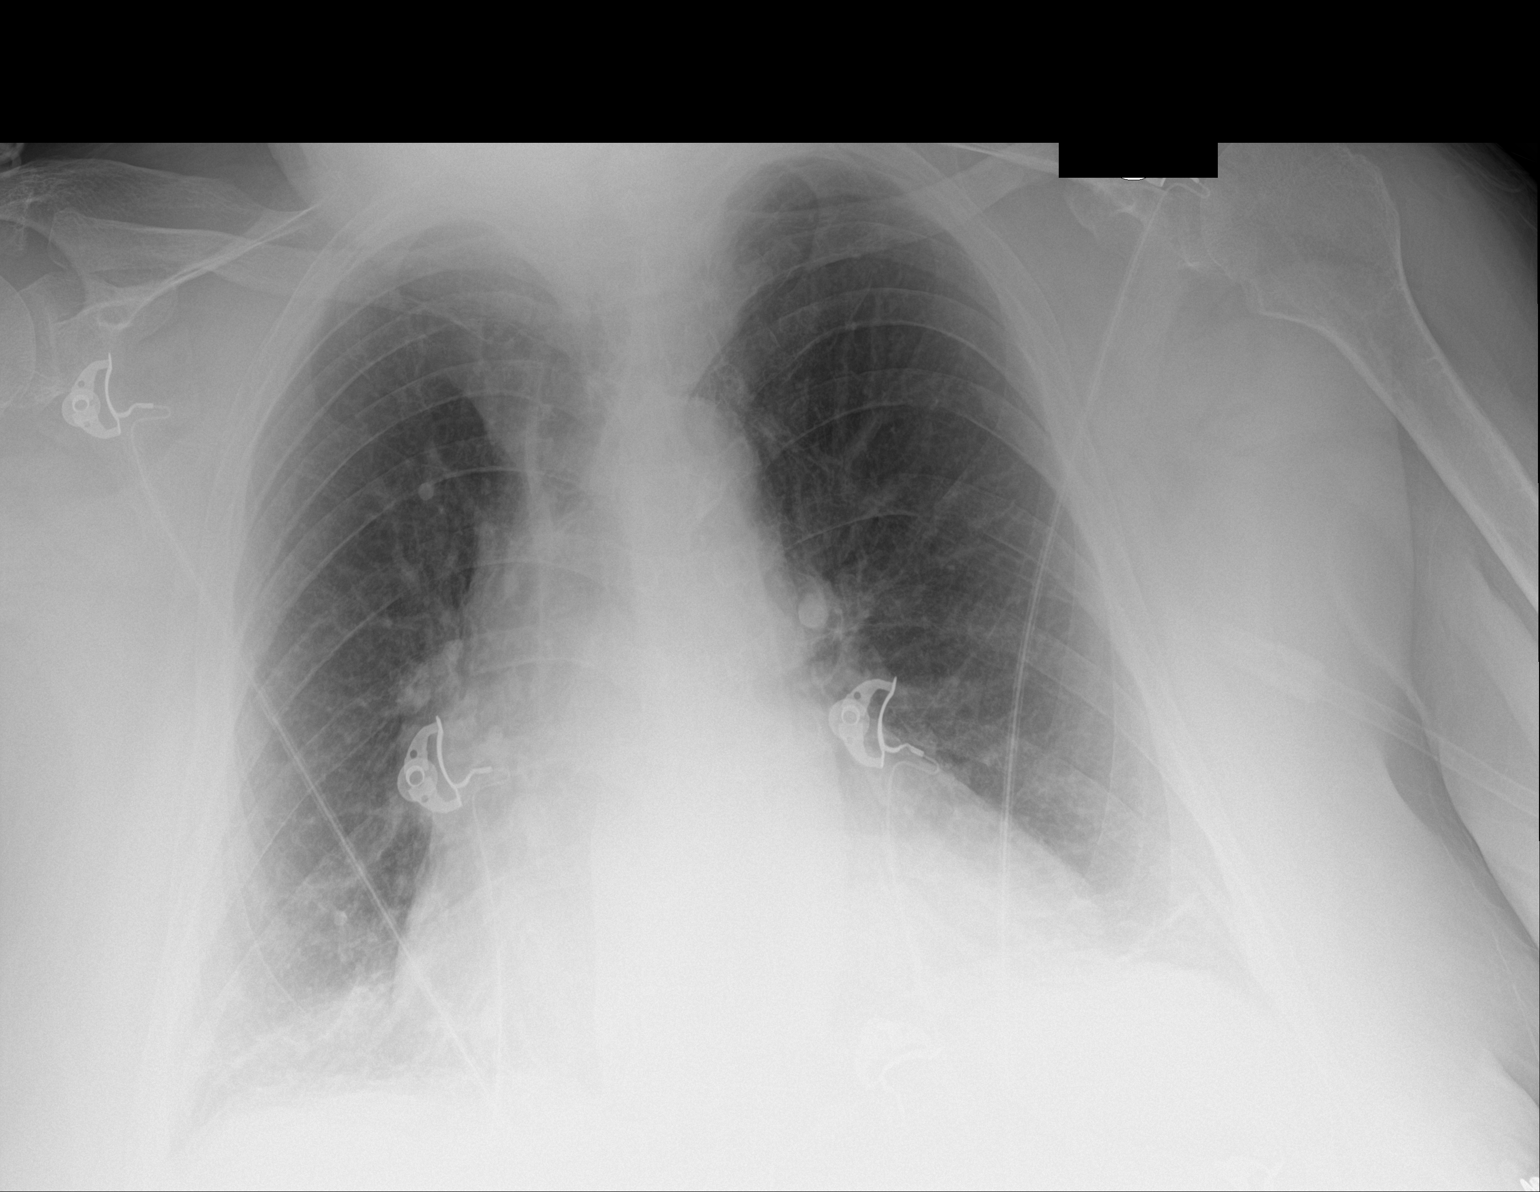

[1 of 1 positions shown; findings below may reference images not displayed]

FINDINGS: Cardiomegaly. No pulmonary venous congestion. Bibasilar mild
interstitial prominence. Small bilateral pleural effusions. CHF
cannot be excluded. Mild bibasilar atelectasis. No pneumothorax.
IMPRESSION: 1. Cardiomegaly with mild bibasilar interstitial prominence. Small
bilateral pleural effusions cannot be excluded. Mild CHF cannot be
excluded.

2.  Bibasilar atelectasis.

## 2020-10-13 ENCOUNTER — Encounter: Payer: Self-pay | Admitting: Cardiovascular Disease

## 2020-10-16 ENCOUNTER — Emergency Department: Payer: Medicare Other

## 2020-10-16 ENCOUNTER — Other Ambulatory Visit: Payer: Self-pay

## 2020-10-16 ENCOUNTER — Encounter: Payer: Self-pay | Admitting: Emergency Medicine

## 2020-10-16 ENCOUNTER — Inpatient Hospital Stay
Admission: EM | Admit: 2020-10-16 | Discharge: 2020-10-20 | DRG: 281 | Disposition: A | Discharge status: Home Health | Payer: Medicare Other | Attending: Internal Medicine | Admitting: Internal Medicine

## 2020-10-16 DIAGNOSIS — R079 Chest pain, unspecified: Secondary | ICD-10-CM | POA: Diagnosis present

## 2020-10-16 DIAGNOSIS — Z7951 Long term (current) use of inhaled steroids: Secondary | ICD-10-CM

## 2020-10-16 DIAGNOSIS — Z794 Long term (current) use of insulin: Secondary | ICD-10-CM

## 2020-10-16 DIAGNOSIS — N184 Chronic kidney disease, stage 4 (severe): Secondary | ICD-10-CM | POA: Diagnosis present

## 2020-10-16 DIAGNOSIS — I214 Non-ST elevation (NSTEMI) myocardial infarction: Secondary | ICD-10-CM | POA: Diagnosis present

## 2020-10-16 DIAGNOSIS — E119 Type 2 diabetes mellitus without complications: Secondary | ICD-10-CM | POA: Diagnosis present

## 2020-10-16 DIAGNOSIS — K219 Gastro-esophageal reflux disease without esophagitis: Secondary | ICD-10-CM | POA: Diagnosis present

## 2020-10-16 DIAGNOSIS — E669 Obesity, unspecified: Secondary | ICD-10-CM | POA: Diagnosis present

## 2020-10-16 DIAGNOSIS — E785 Hyperlipidemia, unspecified: Secondary | ICD-10-CM | POA: Diagnosis present

## 2020-10-16 DIAGNOSIS — Z8249 Family history of ischemic heart disease and other diseases of the circulatory system: Secondary | ICD-10-CM | POA: Diagnosis not present

## 2020-10-16 DIAGNOSIS — Q613 Polycystic kidney, unspecified: Secondary | ICD-10-CM

## 2020-10-16 DIAGNOSIS — J9611 Chronic respiratory failure with hypoxia: Secondary | ICD-10-CM | POA: Diagnosis present

## 2020-10-16 DIAGNOSIS — Z7902 Long term (current) use of antithrombotics/antiplatelets: Secondary | ICD-10-CM | POA: Diagnosis not present

## 2020-10-16 DIAGNOSIS — E538 Deficiency of other specified B group vitamins: Secondary | ICD-10-CM | POA: Diagnosis present

## 2020-10-16 DIAGNOSIS — Z955 Presence of coronary angioplasty implant and graft: Secondary | ICD-10-CM

## 2020-10-16 DIAGNOSIS — I25110 Atherosclerotic heart disease of native coronary artery with unstable angina pectoris: Secondary | ICD-10-CM | POA: Diagnosis present

## 2020-10-16 DIAGNOSIS — Z87891 Personal history of nicotine dependence: Secondary | ICD-10-CM

## 2020-10-16 DIAGNOSIS — Z9981 Dependence on supplemental oxygen: Secondary | ICD-10-CM | POA: Diagnosis not present

## 2020-10-16 DIAGNOSIS — I130 Hypertensive heart and chronic kidney disease with heart failure and stage 1 through stage 4 chronic kidney disease, or unspecified chronic kidney disease: Secondary | ICD-10-CM | POA: Diagnosis present

## 2020-10-16 DIAGNOSIS — E039 Hypothyroidism, unspecified: Secondary | ICD-10-CM | POA: Diagnosis present

## 2020-10-16 DIAGNOSIS — J441 Chronic obstructive pulmonary disease with (acute) exacerbation: Secondary | ICD-10-CM | POA: Diagnosis not present

## 2020-10-16 DIAGNOSIS — Z825 Family history of asthma and other chronic lower respiratory diseases: Secondary | ICD-10-CM

## 2020-10-16 DIAGNOSIS — Z79899 Other long term (current) drug therapy: Secondary | ICD-10-CM

## 2020-10-16 DIAGNOSIS — D631 Anemia in chronic kidney disease: Secondary | ICD-10-CM | POA: Diagnosis present

## 2020-10-16 DIAGNOSIS — Z9109 Other allergy status, other than to drugs and biological substances: Secondary | ICD-10-CM

## 2020-10-16 DIAGNOSIS — Z7982 Long term (current) use of aspirin: Secondary | ICD-10-CM | POA: Diagnosis not present

## 2020-10-16 DIAGNOSIS — I200 Unstable angina: Secondary | ICD-10-CM

## 2020-10-16 DIAGNOSIS — E875 Hyperkalemia: Secondary | ICD-10-CM | POA: Diagnosis not present

## 2020-10-16 DIAGNOSIS — Z20822 Contact with and (suspected) exposure to covid-19: Secondary | ICD-10-CM | POA: Diagnosis present

## 2020-10-16 DIAGNOSIS — I5042 Chronic combined systolic (congestive) and diastolic (congestive) heart failure: Secondary | ICD-10-CM | POA: Diagnosis present

## 2020-10-16 DIAGNOSIS — I10 Essential (primary) hypertension: Secondary | ICD-10-CM | POA: Diagnosis present

## 2020-10-16 DIAGNOSIS — E559 Vitamin D deficiency, unspecified: Secondary | ICD-10-CM | POA: Diagnosis present

## 2020-10-16 DIAGNOSIS — E1122 Type 2 diabetes mellitus with diabetic chronic kidney disease: Secondary | ICD-10-CM | POA: Diagnosis present

## 2020-10-16 DIAGNOSIS — E1129 Type 2 diabetes mellitus with other diabetic kidney complication: Secondary | ICD-10-CM | POA: Diagnosis present

## 2020-10-16 DIAGNOSIS — Z96649 Presence of unspecified artificial hip joint: Secondary | ICD-10-CM | POA: Diagnosis present

## 2020-10-16 DIAGNOSIS — I252 Old myocardial infarction: Secondary | ICD-10-CM

## 2020-10-16 DIAGNOSIS — Z6834 Body mass index (BMI) 34.0-34.9, adult: Secondary | ICD-10-CM | POA: Diagnosis not present

## 2020-10-16 DIAGNOSIS — N1832 Chronic kidney disease, stage 3b: Secondary | ICD-10-CM | POA: Diagnosis not present

## 2020-10-16 DIAGNOSIS — I5020 Unspecified systolic (congestive) heart failure: Secondary | ICD-10-CM | POA: Diagnosis not present

## 2020-10-16 DIAGNOSIS — J449 Chronic obstructive pulmonary disease, unspecified: Secondary | ICD-10-CM | POA: Diagnosis present

## 2020-10-16 DIAGNOSIS — Z801 Family history of malignant neoplasm of trachea, bronchus and lung: Secondary | ICD-10-CM

## 2020-10-16 LAB — BASIC METABOLIC PANEL
Anion gap: 11 (ref 5–15)
BUN: 22 mg/dL (ref 8–23)
CO2: 31 mmol/L (ref 22–32)
Calcium: 8.9 mg/dL (ref 8.9–10.3)
Chloride: 97 mmol/L — ABNORMAL LOW (ref 98–111)
Creatinine, Ser: 2.48 mg/dL — ABNORMAL HIGH (ref 0.44–1.00)
GFR, Estimated: 19 mL/min — ABNORMAL LOW (ref >60)
Glucose, Bld: 320 mg/dL — ABNORMAL HIGH (ref 70–99)
Potassium: 4.3 mmol/L (ref 3.5–5.1)
Sodium: 139 mmol/L (ref 135–145)

## 2020-10-16 LAB — CBG MONITORING, ED
Glucose-Capillary: 237 mg/dL — ABNORMAL HIGH (ref 70–99)
Glucose-Capillary: 150 mg/dL — ABNORMAL HIGH (ref 70–99)
Glucose-Capillary: 199 mg/dL — ABNORMAL HIGH (ref 70–99)

## 2020-10-16 LAB — TROPONIN I (HIGH SENSITIVITY)
Troponin I (High Sensitivity): 165 ng/L (ref <18)
Troponin I (High Sensitivity): 148 ng/L (ref <18)

## 2020-10-16 LAB — APTT: aPTT: 26 seconds (ref 24–36)

## 2020-10-16 LAB — CBC
HCT: 33.6 % — ABNORMAL LOW (ref 36.0–46.0)
Hemoglobin: 9.9 g/dL — ABNORMAL LOW (ref 12.0–15.0)
MCH: 26.7 pg (ref 26.0–34.0)
MCHC: 29.5 g/dL — ABNORMAL LOW (ref 30.0–36.0)
MCV: 90.6 fL (ref 80.0–100.0)
Platelets: 326 10*3/uL (ref 150–400)
RBC: 3.71 MIL/uL — ABNORMAL LOW (ref 3.87–5.11)
RDW: 16.5 % — ABNORMAL HIGH (ref 11.5–15.5)
WBC: 9.5 10*3/uL (ref 4.0–10.5)
nRBC: 0 % (ref 0.0–0.2)

## 2020-10-16 LAB — PROTIME-INR
INR: 0.9 (ref 0.8–1.2)
Prothrombin Time: 12.5 seconds (ref 11.4–15.2)

## 2020-10-16 LAB — HEPARIN LEVEL (UNFRACTIONATED): Heparin Unfractionated: <0.1 IU/mL — ABNORMAL LOW (ref 0.30–0.70)

## 2020-10-16 LAB — RESP PANEL BY RT-PCR (FLU A&B, COVID) ARPGX2
Influenza A by PCR: NEGATIVE
Influenza B by PCR: NEGATIVE
SARS Coronavirus 2 by RT PCR: NEGATIVE

## 2020-10-16 MED ORDER — IPRATROPIUM-ALBUTEROL 0.5-2.5 (3) MG/3ML IN SOLN
3.0000 mL | Freq: Four times a day (QID) | RESPIRATORY_TRACT | Status: DC | PRN
  Administered 2020-10-17 – 2020-10-18 (×2): 3 mL via RESPIRATORY_TRACT
  Filled 2020-10-16 (×2): qty 3

## 2020-10-16 MED ORDER — MORPHINE SULFATE (PF) 2 MG/ML IV SOLN: 1.0000 mg | INTRAVENOUS | Status: DC | PRN

## 2020-10-16 MED ORDER — MORPHINE SULFATE (PF) 4 MG/ML IV SOLN
4.0000 mg | Freq: Once | INTRAVENOUS | Status: AC
Start: 2020-10-16 — End: 2020-10-16
  Administered 2020-10-16: 4 mg via INTRAVENOUS
  Filled 2020-10-16: qty 1

## 2020-10-16 MED ORDER — ATORVASTATIN CALCIUM 20 MG PO TABS
40.0000 mg | ORAL_TABLET | Freq: Every day | ORAL | Status: DC
  Administered 2020-10-16 – 2020-10-18 (×3): 40 mg via ORAL
  Filled 2020-10-16 (×3): qty 2

## 2020-10-16 MED ORDER — SODIUM CHLORIDE 0.9% FLUSH: 3.0000 mL | INTRAVENOUS | Status: DC | PRN

## 2020-10-16 MED ORDER — ALBUTEROL SULFATE (2.5 MG/3ML) 0.083% IN NEBU
3.0000 mL | INHALATION_SOLUTION | RESPIRATORY_TRACT | Status: DC | PRN
  Administered 2020-10-18 – 2020-10-19 (×3): 3 mL via RESPIRATORY_TRACT
  Filled 2020-10-16 (×3): qty 3

## 2020-10-16 MED ORDER — ACETAMINOPHEN 325 MG PO TABS
650.0000 mg | ORAL_TABLET | Freq: Four times a day (QID) | ORAL | Status: DC | PRN
  Administered 2020-10-18 – 2020-10-19 (×2): 650 mg via ORAL
  Filled 2020-10-16 (×2): qty 2

## 2020-10-16 MED ORDER — HEPARIN BOLUS VIA INFUSION
2350.0000 [IU] | Freq: Once | INTRAVENOUS | Status: AC
  Administered 2020-10-16: 2350 [IU] via INTRAVENOUS
  Filled 2020-10-16: qty 2350

## 2020-10-16 MED ORDER — CLOPIDOGREL BISULFATE 75 MG PO TABS
75.0000 mg | ORAL_TABLET | Freq: Every day | ORAL | Status: DC
  Administered 2020-10-16 – 2020-10-19 (×4): 75 mg via ORAL
  Filled 2020-10-16 (×4): qty 1

## 2020-10-16 MED ORDER — FLUTICASONE-UMECLIDIN-VILANT 100-62.5-25 MCG/INH IN AEPB: 1.0000 | INHALATION_SPRAY | Freq: Every day | RESPIRATORY_TRACT | Status: DC

## 2020-10-16 MED ORDER — TRAZODONE HCL 50 MG PO TABS
50.0000 mg | ORAL_TABLET | Freq: Every day | ORAL | Status: DC
  Administered 2020-10-16 – 2020-10-19 (×4): 50 mg via ORAL
  Filled 2020-10-16 (×4): qty 1

## 2020-10-16 MED ORDER — VITAMIN B-12 1000 MCG PO TABS
1500.0000 ug | ORAL_TABLET | Freq: Every day | ORAL | Status: DC
  Administered 2020-10-18 – 2020-10-19 (×2): 1500 ug via ORAL
  Filled 2020-10-16: qty 2
  Filled 2020-10-16: qty 1.5
  Filled 2020-10-16: qty 2

## 2020-10-16 MED ORDER — PANTOPRAZOLE SODIUM 40 MG PO TBEC
40.0000 mg | DELAYED_RELEASE_TABLET | Freq: Every day | ORAL | Status: DC
  Administered 2020-10-18 – 2020-10-19 (×2): 40 mg via ORAL
  Filled 2020-10-16 (×2): qty 1

## 2020-10-16 MED ORDER — HEPARIN BOLUS VIA INFUSION
4000.0000 [IU] | Freq: Once | INTRAVENOUS | Status: AC
  Administered 2020-10-16: 4000 [IU] via INTRAVENOUS
  Filled 2020-10-16: qty 4000

## 2020-10-16 MED ORDER — ONDANSETRON HCL 4 MG PO TABS
4.0000 mg | ORAL_TABLET | Freq: Four times a day (QID) | ORAL | Status: DC | PRN
  Administered 2020-10-18 – 2020-10-19 (×5): 4 mg via ORAL
  Filled 2020-10-16 (×5): qty 1

## 2020-10-16 MED ORDER — INSULIN DETEMIR 100 UNIT/ML ~~LOC~~ SOLN
43.0000 [IU] | Freq: Every day | SUBCUTANEOUS | Status: DC
  Administered 2020-10-16 – 2020-10-19 (×4): 43 [IU] via SUBCUTANEOUS
  Filled 2020-10-16 (×6): qty 0.43

## 2020-10-16 MED ORDER — SODIUM CHLORIDE 0.9 % IV SOLN: 6.2500 mg | Freq: Four times a day (QID) | INTRAVENOUS | Status: DC | PRN

## 2020-10-16 MED ORDER — BUSPIRONE HCL 5 MG PO TABS
5.0000 mg | ORAL_TABLET | Freq: Two times a day (BID) | ORAL | Status: DC
  Administered 2020-10-16 – 2020-10-19 (×7): 5 mg via ORAL
  Filled 2020-10-16 (×9): qty 1

## 2020-10-16 MED ORDER — HEPARIN (PORCINE) 25000 UT/250ML-% IV SOLN
1600.0000 [IU]/h | INTRAVENOUS | Status: DC
  Administered 2020-10-16: 1050 [IU]/h via INTRAVENOUS
  Administered 2020-10-17: 1300 [IU]/h via INTRAVENOUS
  Filled 2020-10-16 (×2): qty 250

## 2020-10-16 MED ORDER — UMECLIDINIUM-VILANTEROL 62.5-25 MCG/INH IN AEPB
1.0000 | INHALATION_SPRAY | Freq: Every day | RESPIRATORY_TRACT | Status: DC
  Administered 2020-10-17 – 2020-10-20 (×4): 1 via RESPIRATORY_TRACT
  Filled 2020-10-16: qty 14

## 2020-10-16 MED ORDER — VITAMIN D 25 MCG (1000 UNIT) PO TABS
5000.0000 [IU] | ORAL_TABLET | Freq: Every day | ORAL | Status: DC
  Administered 2020-10-18 – 2020-10-19 (×2): 5000 [IU] via ORAL
  Filled 2020-10-16 (×2): qty 5

## 2020-10-16 MED ORDER — SODIUM CHLORIDE 0.9% FLUSH
3.0000 mL | Freq: Two times a day (BID) | INTRAVENOUS | Status: DC
  Administered 2020-10-17 – 2020-10-18 (×2): 3 mL via INTRAVENOUS

## 2020-10-16 MED ORDER — CARVEDILOL 6.25 MG PO TABS
6.2500 mg | ORAL_TABLET | Freq: Two times a day (BID) | ORAL | Status: DC
  Administered 2020-10-16 – 2020-10-20 (×7): 6.25 mg via ORAL
  Filled 2020-10-16 (×7): qty 1

## 2020-10-16 MED ORDER — TIZANIDINE HCL 4 MG PO TABS
4.0000 mg | ORAL_TABLET | Freq: Three times a day (TID) | ORAL | Status: DC | PRN
  Filled 2020-10-16: qty 1

## 2020-10-16 MED ORDER — ISOSORB DINITRATE-HYDRALAZINE 20-37.5 MG PO TABS
0.5000 | ORAL_TABLET | Freq: Three times a day (TID) | ORAL | Status: DC
  Administered 2020-10-16 – 2020-10-17 (×4): 0.5 via ORAL
  Filled 2020-10-16 (×6): qty 0.5

## 2020-10-16 MED ORDER — NITROGLYCERIN 0.4 MG SL SUBL
0.4000 mg | SUBLINGUAL_TABLET | SUBLINGUAL | Status: DC | PRN
  Administered 2020-10-17 – 2020-10-19 (×11): 0.4 mg via SUBLINGUAL
  Filled 2020-10-16 (×6): qty 1

## 2020-10-16 MED ORDER — OXYCODONE-ACETAMINOPHEN 5-325 MG PO TABS
1.0000 | ORAL_TABLET | Freq: Every day | ORAL | Status: DC
  Administered 2020-10-16 – 2020-10-17 (×2): 1 via ORAL
  Filled 2020-10-16 (×2): qty 1

## 2020-10-16 MED ORDER — SODIUM CHLORIDE 0.9 % IV SOLN: 250.0000 mL | INTRAVENOUS | Status: DC | PRN

## 2020-10-16 MED ORDER — ACETAMINOPHEN 650 MG RE SUPP: 650.0000 mg | Freq: Four times a day (QID) | RECTAL | Status: DC | PRN

## 2020-10-16 MED ORDER — SODIUM CHLORIDE 0.9% FLUSH
3.0000 mL | Freq: Two times a day (BID) | INTRAVENOUS | Status: DC
  Administered 2020-10-16 – 2020-10-18 (×2): 3 mL via INTRAVENOUS

## 2020-10-16 MED ORDER — SODIUM CHLORIDE 0.9 % IV SOLN
6.2500 mg | Freq: Four times a day (QID) | INTRAVENOUS | Status: DC | PRN
  Administered 2020-10-17: 6.25 mg via INTRAVENOUS
  Filled 2020-10-16 (×2): qty 0.25

## 2020-10-16 MED ORDER — INSULIN ASPART 100 UNIT/ML IJ SOLN
0.0000 [IU] | Freq: Three times a day (TID) | INTRAMUSCULAR | Status: DC
  Administered 2020-10-16: 1 [IU] via SUBCUTANEOUS
  Administered 2020-10-16: 3 [IU] via SUBCUTANEOUS
  Administered 2020-10-17: 1 [IU] via SUBCUTANEOUS
  Administered 2020-10-17: 3 [IU] via SUBCUTANEOUS
  Administered 2020-10-18: 1 [IU] via SUBCUTANEOUS
  Administered 2020-10-18: 5 [IU] via SUBCUTANEOUS
  Administered 2020-10-18: 3 [IU] via SUBCUTANEOUS
  Administered 2020-10-19: 2 [IU] via SUBCUTANEOUS
  Administered 2020-10-19: 3 [IU] via SUBCUTANEOUS
  Administered 2020-10-19: 2 [IU] via SUBCUTANEOUS
  Filled 2020-10-16 (×10): qty 1

## 2020-10-16 MED ORDER — BUDESONIDE 0.25 MG/2ML IN SUSP
0.2500 mg | Freq: Two times a day (BID) | RESPIRATORY_TRACT | Status: DC
  Administered 2020-10-16 – 2020-10-20 (×8): 0.25 mg via RESPIRATORY_TRACT
  Filled 2020-10-16 (×9): qty 2

## 2020-10-16 MED ORDER — ONDANSETRON HCL 4 MG/2ML IJ SOLN
4.0000 mg | Freq: Four times a day (QID) | INTRAMUSCULAR | Status: DC | PRN
  Administered 2020-10-16: 4 mg via INTRAVENOUS
  Filled 2020-10-16: qty 2

## 2020-10-16 MED ORDER — ASPIRIN EC 81 MG PO TBEC
81.0000 mg | DELAYED_RELEASE_TABLET | Freq: Every day | ORAL | Status: DC
  Administered 2020-10-16 – 2020-10-19 (×3): 81 mg via ORAL
  Filled 2020-10-16 (×3): qty 1

## 2020-10-16 MED ORDER — ONDANSETRON HCL 4 MG/2ML IJ SOLN
4.0000 mg | Freq: Once | INTRAMUSCULAR | Status: AC
  Administered 2020-10-16: 4 mg via INTRAVENOUS
  Filled 2020-10-16: qty 2

## 2020-10-16 NOTE — H&P (Signed)
HISTORY AND PHYSICAL       PATIENT DETAILS Name: Mckenzie Taylor Age: 80 y.o. Sex: female Date of Birth: [birth year] Admit Date: 10/16/2020 OXB:DZHGD, Judson Roch, MD   Patient coming from:   CHIEF COMPLAINT:  Chest pain  HPI: Mckenzie Taylor is a 80 y.o. female with medical history significant of CAD, CKD stage IV, COPD on 2-3 L of oxygen at home, chronic systolic heart failure, DM-2 who presented to the ED for evaluation of chest pain.  Per patient-she woke up around 2:30 in the AM with retrosternal chest pain.  She describes the pain as squeezing-not radiating-associated with some mild diaphoresis and nausea.  She describes the pain as 10/10-however when she first presented to the ED-she was provided supportive care-currently the pain has decreased to 3/10.  She appears comfortable.  She denies any headache, fever, cough.  She denies any abdominal pain, vomiting or diarrhea.  ED Course: Found to have minimally elevated troponin-she was recently admitted to the hospital last month for similar issues-and declined a cardiac cath.  ED MD spoke with cardiology on-call-recommendations were to start IV heparin and other supportive care-cardiology will formally consult and provide further recommendations.   REVIEW OF SYSTEMS:  Constitutional:   No  weight loss, night sweats,  Fevers, chills, fatigue.  HEENT:    No headaches, Dysphagia,Tooth/dental problems,Sore throat,  No sneezing, itching, ear ache, nasal congestion, post nasal drip  Cardio-vascular: No Orthopnea, PND,lower extremity edema, anasarca, palpitations  GI:  No heartburn, indigestion, abdominal pain, nausea, vomiting, diarrhea, melena or hematochezia  Resp: No shortness of breath, cough, hemoptysis,plueritic chest pain.   Skin:  No rash or lesions.  GU:  No dysuria, change in color of urine, no urgency or frequency.  No flank pain.  Musculoskeletal: No joint pain or swelling.  No decreased range of  motion.  No back pain.  Endocrine: No heat intolerance, no cold intolerance, no polyuria, no polydipsia  Psych: No change in mood or affect. No depression or anxiety.  No memory loss.   ALLERGIES:   Allergies  Allergen Reactions   Other Rash    Pt reports allergy to metals.    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 Estimates quit ~ 2015   GERD (gastroesophageal reflux disease)    HTN (hypertension)    Hyperlipidemia with target LDL less than 70    Hypothyroidism    subclinical. low TSH, normal thyroid panel. biopsy 2010   Vitamin B12 deficiency     PAST SURGICAL HISTORY: Past Surgical History:  Procedure Laterality Date   CHOLECYSTECTOMY     hip replacement     bilateral-secondary to avascular necrosis   right eye lens replacement     ULNAR NERVE REPAIR  bilateral   VESICOVAGINAL FISTULA CLOSURE W/ TAH      MEDICATIONS AT HOME: Prior to Admission medications   Medication Sig Start Date End Date Taking? Authorizing Provider  albuterol (VENTOLIN HFA) 108 (90 Base) MCG/ACT inhaler Inhale 2 puffs into the lungs every 4 (four) hours as needed.     [provider]  aspirin EC 81 MG EC tablet Take 1 tablet (81 mg total) by mouth daily. 11/11/18   Gladstone Lighter, MD  atorvastatin (LIPITOR) 40 MG tablet Take 40 mg by mouth every evening.     [provider]  busPIRone (BUSPAR) 5 MG tablet Take 5 mg by mouth 2 (two) times daily.    [provider]  carvedilol (COREG) 6.25 MG tablet Take 1 tablet (6.25 mg total) by mouth 2 (two) times daily with a meal. 11/11/18   Gladstone Lighter, MD  Cholecalciferol (VITAMIN D3) 5000 units CAPS Take 1 capsule by mouth daily.    [provider]  clopidogrel (PLAVIX) 75 MG tablet Take 1 tablet (75 mg total) by mouth daily. 09/19/20 12/18/20  Enzo Bi, MD  Cyanocobalamin 1500 MCG TBDP Take 3 tablets by mouth daily.    [provider]  Fluticasone-Umeclidin-Vilant 100-62.5-25 MCG/INH AEPB Inhale 1 puff into the lungs daily.    [provider]  HUMALOG KWIKPEN 100 UNIT/ML KwikPen Inject 0-22 Units into the skin 3 (three) times daily with meals. Sliding scale 10/26/18   [provider]  ipratropium-albuterol (DUONEB) 0.5-2.5 (3) MG/3ML SOLN Take 3 mLs by nebulization 4 (four) times daily. 03/28/16   Vaughan Basta, MD  isosorbide-hydrALAZINE (BIDIL) 20-37.5 MG tablet Take 0.5 tablets by mouth 3 (three) times daily. 09/18/20 12/17/20  Enzo Bi, MD  LEVEMIR FLEXTOUCH 100 UNIT/ML Pen Inject 43 Units into the skin at bedtime.     [provider]  metolazone (ZAROXOLYN) 2.5 MG tablet Take 1 tablet (2.5 mg total) by mouth daily as needed (for weight gain > 3 pounds in 1 day). 11/11/18   Gladstone Lighter, MD  omeprazole (PRILOSEC) 20 MG capsule Take 20 mg by mouth daily.    [provider]  oxyCODONE-acetaminophen (PERCOCET/ROXICET) 5-325 MG tablet Take 1 tablet by mouth at bedtime. 09/08/20   [provider]  promethazine (PHENERGAN) 25 MG tablet Take 25 mg by mouth every 6 (six) hours as needed for nausea.     [provider]  tiZANidine (ZANAFLEX) 4 MG tablet Take 4 mg by mouth 3 (three) times daily as needed for muscle spasms.     [provider]  torsemide (DEMADEX) 20 MG tablet Take 1 tablet (20 mg total) by mouth daily.  11/12/18   Gladstone Lighter, MD  traZODone (DESYREL) 50 MG tablet Take 50 mg by mouth at bedtime. 08/10/16   [provider]    FAMILY HISTORY: Family History  Problem Relation Age of Onset   Cancer Father        lung   COPD Mother    Heart disease Mother    Heart disease Maternal Uncle       SOCIAL HISTORY:  reports that she has quit smoking. She has a 50.00 pack-year smoking history. She has never used smokeless tobacco. She reports that she does not drink alcohol and does not use drugs.  PHYSICAL EXAM: Blood pressure (!) 144/85, pulse 99, temperature 98.7 F (37.1 C), resp. rate 18, height 5\' 5"  (1.651 m), weight 94.1 kg, SpO2 99 %.  General appearance :Awake, alert, not in any distress.  Eyes:,  pupils equally reactive to light and accomodation,no scleral icterus.Pink conjunctiva HEENT: Atraumatic and Normocephalic Neck: supple, no JVD.  Resp:Good air entry bilaterally, no added sounds  CVS: S1 S2 regular, no murmurs.  GI: Bowel sounds present, Non tender and not distended with no gaurding, rigidity or rebound. Extremities: B/L Lower Ext shows no edema, both legs are warm to touch Neurology:  speech clear,Non focal, sensation is grossly intact. Psychiatric: Normal judgment and insight. Alert and oriented x 3. Normal mood. Musculoskeletal:gait appears to be normal.No digital cyanosis Skin:No Rash, warm and dry Wounds:N/A  LABS ON ADMISSION:  I have personally reviewed following labs and imaging studies  CBC: Recent Labs  Lab 10/16/20 0840  WBC 9.5  HGB 9.9*  HCT 33.6*  MCV 90.6  PLT 643    Basic Metabolic Panel: Recent Labs  Lab 10/16/20 0840  NA 139  K 4.3  CL 97*  CO2 31  GLUCOSE 320*  BUN 22  CREATININE 2.48*  CALCIUM 8.9    GFR: Estimated Creatinine Clearance: 21.2 mL/min (A) (by C-G formula based on SCr of 2.48 mg/dL (H)).  Liver Function Tests: No results for input(s): AST, ALT, ALKPHOS, BILITOT, PROT, ALBUMIN in the last 168  hours. No results for input(s): LIPASE, AMYLASE in the last 168 hours. No results for input(s): AMMONIA in the last 168 hours.  Coagulation Profile: No results for input(s): INR, PROTIME in the last 168 hours.  Cardiac Enzymes: No results for input(s): CKTOTAL, CKMB, CKMBINDEX, TROPONINI in the last 168 hours.  BNP (last 3 results) No results for input(s): PROBNP in the last 8760 hours.  HbA1C: No results for input(s): HGBA1C in the last 72 hours.  CBG: No results for input(s): GLUCAP in the last 168 hours.  Lipid Profile: No results for input(s): CHOL, HDL, LDLCALC, TRIG, CHOLHDL, LDLDIRECT in the last 72 hours.  Thyroid Function Tests: No results for input(s): TSH, T4TOTAL, FREET4, T3FREE, THYROIDAB in the last 72 hours.  Anemia Panel: No results for input(s): VITAMINB12, FOLATE, FERRITIN, TIBC, IRON, RETICCTPCT in the last 72 hours.  Urine analysis:    Component Value Date/Time   COLORURINE Amber 10/21/2012 1145   APPEARANCEUR Cloudy 10/21/2012 1145   LABSPEC 1.015 10/21/2012 1145   PHURINE 6.0 10/21/2012 1145   GLUCOSEU Negative 10/21/2012 1145   HGBUR 1+ 10/21/2012 1145   BILIRUBINUR Negative 10/21/2012 1145   KETONESUR Negative 10/21/2012 1145   PROTEINUR >=500 10/21/2012 1145   NITRITE Positive 10/21/2012 1145   LEUKOCYTESUR 3+ 10/21/2012 1145    Sepsis Labs: Lactic Acid, Venous    Component Value Date/Time   LATICACIDVEN 2.0 ([place]) 08/22/2020 2335     Microbiology: No results found for this or any previous visit (from the past 240 hour(s)).    RADIOLOGIC STUDIES ON ADMISSION: No results found.  I have personally reviewed images of chest xray-no pneumonia.  EKG:  Personally reviewed. nsr  ASSESSMENT AND PLAN: Chest pain: Likely non-STEMI-has known CAD-recently admitted in July for similar issues-and had declined LHC.  As noted above-ED MD has discussed with cardiology on-call-recommendations are for continuing IV heparin-antiplatelet  agent/statin/beta-blocker-cardiology to formally see shortly and provide further recommendations.  We will keep n.p.o. till then.  COPD with chronic hypoxemic respiratory failure on 2-3 L of oxygen at home: Stable-not in exacerbation-continue usual bronchodilator regimen  Insulin-dependent DM-2: Resume Lantus-start SSI-follow CBG trend and adjust accordingly.  HTN: BP stable-resume Imdur/beta-blocker-follow and adjust  Chronic systolic heart failure: Euvolemic on exam-hold diuretics as patient might need LHC.  CKD stage  IV: Creatinine close to baseline-watch closely if patient does not require LHC-at risk for kidney injury.  GERD: Continue PPI  Further plan will depend as patient's clinical course evolves and further radiologic and laboratory data become available. Patient will be monitored closely.  Above noted plan was discussed with patient face to face at bedside, she was in agreement.   CONSULTS: Cardiology  DVT Prophylaxis: IV Heparin  Code Status: Full Code-d/w patient at bedside  Disposition Plan:  Discharge back home  possibly in 2-3 days, depending on clinical course  Admission status: Inpatient    The medical decision making on this patient was of high complexity and the patient is at high risk for clinical deterioration, therefore this is a level 3 visit    Total time spent  55 minutes.Greater than 50% of this time was spent in counseling, explanation of diagnosis, planning of further management, and coordination of care.  Severity of illness:  The appropriate patient status for this patient is INPATIENT. Inpatient status is judged to be reasonable and necessary in order to provide the required intensity of service to ensure the patient's safety. The patient's presenting symptoms, physical exam findings, and initial radiographic and laboratory data in the context of their chronic comorbidities is felt to place them at high risk for further clinical deterioration.  Furthermore, it is not anticipated that the patient will be medically stable for discharge from the hospital within 2 midnights of admission. The following factors support the patient status of inpatient.   " The patient's presenting symptoms include chest pain " The worrisome physical exam findings include chest pain/non-STEMI " The initial radiographic and laboratory data are worrisome because of non-STEMI " The chronic co-morbidities include known CAD/COPD/chronic hypoxic respiratory failure/DM-2   * I certify that at the point of admission it is my clinical judgment that the patient will require inpatient hospital care spanning beyond 2 midnights from the point of admission due to high intensity of service, high risk for further deterioration and high frequency of surveillance required.  Oren Binet [facility] Pager [phone]  If 7PM-7AM, please contact night-coverage  Please page via [email or web address]  Go to [email or web address] and use [facility]'s universal password to access. If you do not have the password, please contact the hospital operator.  Locate the [facility] provider you are looking for under [facility] and page to a number that you can be directly reached. If you still have difficulty reaching the provider, please page the [facility] (Director on Call) for the Hospitalists listed on amion for assistance.  10/16/2020, 11:15 AM

## 2020-10-16 NOTE — ED Notes (Signed)
Dr. Bradler at bedside 

## 2020-10-16 NOTE — ED Provider Notes (Signed)
[facility] Emergency Department Provider Note   ____________________________________________   Event Date/Time   First MD Initiated Contact with Patient 10/16/20 [phone]     (approximate)  I have reviewed the triage vital signs and the nursing notes.   HISTORY  Chief Complaint Chest Pain    HPI Mckenzie Taylor is a 80 y.o. female with the below stated past medical history including significant and CAD who presents for chest pain  LOCATION: Substernal DURATION: 3 hours prior to arrival TIMING: Improved since onset SEVERITY: 3/10 currently.  Started at 50/10 QUALITY: Burning/pressure CONTEXT: States this pain woke her from sleep approximately 3 hours prior to arrival and is described as being similar to her anginal pain that she has had in the past and during her previous NSTEMI for which she was hospitalized approximately 1 month ago.  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 Estimates quit ~ 2015   GERD (gastroesophageal reflux disease)    HTN  (hypertension)    Hyperlipidemia with target LDL less than 70    Hypothyroidism    subclinical. low TSH, normal thyroid panel. 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/16/2020   CKD (chronic kidney disease), stage IV ([place]) 09/16/2020   Depression 09/16/2020   CAD (coronary artery disease) 09/16/2020   Chronic diastolic CHF (congestive heart failure) ([place]) 09/16/2020   Hyperkalemia 09/16/2020   Positive D dimer 09/16/2020   Demand ischemia ([place]) 11/10/2018   Acute on chronic combined systolic and diastolic CHF (congestive heart failure) ([place]) 11/10/2018   Chest pain 11/08/2018   Cellulitis of right leg 02/10/2018   COPD exacerbation ([place]) 01/04/2018   CHF (congestive heart failure) ([place]) 01/06/2017   COPD (chronic obstructive pulmonary disease) ([place]) 11/25/2016   Nausea and vomiting 10/29/2016   Acute on chronic congestive heart failure ([place])    Acute pulmonary edema ([place])    Acute respiratory failure with hypoxia ([place]) 10/25/2016   COPD with acute exacerbation ([place]) 08/14/2016   CAP (community acquired pneumonia) 33/54/5625   Acute systolic CHF (congestive heart failure) ([place]) 08/14/2016   Accelerated hypertension 08/14/2016   GERD (gastroesophageal reflux disease) 08/14/2016   Diabetes ([place]) 08/14/2016  Respiratory distress 05/05/2016   Acute respiratory failure ([place]) 03/24/2016   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 Authorizing Provider  albuterol (VENTOLIN HFA) 108 (90 Base) MCG/ACT inhaler Inhale 2 puffs into the lungs every 4 (four) hours as needed.     [provider]  aspirin EC 81 MG EC tablet Take 1 tablet (81 mg total) by mouth daily. 11/11/18   Gladstone Lighter, MD  atorvastatin (LIPITOR) 40 MG tablet Take 40 mg by mouth every evening.     [provider]  busPIRone (BUSPAR) 5 MG tablet Take 5 mg by mouth 2 (two) times daily.    [provider]  carvedilol (COREG) 6.25 MG tablet Take 1 tablet (6.25 mg total) by mouth 2 (two) times daily with a meal. 11/11/18   Gladstone Lighter, MD  Cholecalciferol (VITAMIN D3) 5000 units CAPS Take 1 capsule by mouth daily.    [provider]  clopidogrel (PLAVIX) 75 MG tablet Take 1 tablet (75 mg total) by mouth daily. 09/19/20 12/18/20  Enzo Bi, MD  Cyanocobalamin 1500 MCG TBDP Take 3 tablets by mouth daily.    [provider]  Fluticasone-Umeclidin-Vilant 100-62.5-25 MCG/INH AEPB Inhale 1 puff into the lungs daily.    [provider]  HUMALOG KWIKPEN 100 UNIT/ML KwikPen Inject 0-22 Units into the skin 3 (three) times daily with meals. Sliding scale 10/26/18   [provider]  ipratropium-albuterol (DUONEB) 0.5-2.5 (3) MG/3ML SOLN Take 3 mLs by nebulization 4 (four) times daily. 03/28/16   Vaughan Basta, MD  isosorbide-hydrALAZINE (BIDIL) 20-37.5 MG tablet Take 0.5 tablets by mouth 3 (three) times daily. 09/18/20 12/17/20  Enzo Bi, MD  LEVEMIR FLEXTOUCH 100 UNIT/ML Pen Inject 43 Units into the skin at bedtime.     [provider]  metolazone (ZAROXOLYN) 2.5 MG tablet Take 1 tablet (2.5 mg total) by mouth daily as needed  (for weight gain > 3 pounds in 1 day). 11/11/18   Gladstone Lighter, MD  omeprazole (PRILOSEC) 20 MG capsule Take 20 mg by mouth daily.    [provider]  oxyCODONE-acetaminophen (PERCOCET/ROXICET) 5-325 MG tablet Take 1 tablet by mouth at bedtime. 09/08/20   [provider]  promethazine (PHENERGAN) 25 MG tablet Take 25 mg by mouth every 6 (six) hours as needed for nausea.     [provider]  tiZANidine (ZANAFLEX) 4 MG tablet Take 4 mg by mouth 3 (three) times daily as needed for muscle spasms.     [provider]  torsemide (DEMADEX) 20 MG tablet Take 1 tablet (20 mg total) by mouth daily. 11/12/18   Gladstone Lighter, MD  traZODone (DESYREL) 50 MG tablet Take 50 mg by mouth at bedtime. 08/10/16   [provider]    Allergies Other  Family History  Problem Relation Age of Onset   Cancer Father        lung   COPD Mother    Heart disease Mother  Heart disease Maternal Uncle     Social History Social History   Tobacco Use   Smoking status: Former    Packs/day: 1.00    Years: 50.00    Pack years: 50.00    Types: Cigarettes   Smokeless tobacco: Never   Tobacco comments:    1 ppd - 50 years   Substance Use Topics   Alcohol use: No   Drug use: No    Review of Systems Constitutional: No fever/chills Eyes: No visual changes. ENT: No sore throat. Cardiovascular: Endorses substernal chest pain. Respiratory: Denies shortness of breath. Gastrointestinal: No abdominal pain.  No nausea, no vomiting.  No diarrhea. Genitourinary: Negative for dysuria. Musculoskeletal: Negative for acute arthralgias Skin: Negative for rash. Neurological: Negative for headaches, weakness/numbness/paresthesias in any extremity Psychiatric: Negative for suicidal ideation/homicidal ideation   ____________________________________________   PHYSICAL EXAM:  VITAL SIGNS: ED Triage Vitals  Enc Vitals Group     BP 10/16/20 0841 (!) 157/82     Pulse  Rate 10/16/20 0841 (!) 109     Resp 10/16/20 0841 19     Temp 10/16/20 0841 98.7 F (37.1 C)     Temp src --      SpO2 10/16/20 0841 100 %     Weight 10/16/20 0833 207 lb 7.3 oz (94.1 kg)     Height 10/16/20 0833 5\' 5"  (1.651 m)     Head Circumference --      Peak Flow --      Pain Score --      Pain Loc --      Pain Edu? --      Excl. in [place]? --    Constitutional: Alert and oriented. Well appearing overweight elderly Caucasian female in no acute distress. Eyes: Conjunctivae are normal. PERRL. Head: Atraumatic. Nose: No congestion/rhinnorhea. Mouth/Throat: Mucous membranes are moist. Neck: No stridor Cardiovascular: Grossly normal heart sounds.  Good peripheral circulation. Respiratory: Normal respiratory effort.  No retractions. Gastrointestinal: Soft and nontender. No distention. Musculoskeletal: No obvious deformities Neurologic:  Normal speech and language. No gross focal neurologic deficits are appreciated. Skin:  Skin is warm and dry. No rash noted. Psychiatric: Mood and affect are normal. Speech and behavior are normal.  ____________________________________________   LABS (all labs ordered are listed, but only abnormal results are displayed)  Labs Reviewed  BASIC METABOLIC PANEL - Abnormal; Notable for the following components:      Result Value   Chloride 97 (*)    Glucose, Bld 320 (*)    Creatinine, Ser 2.48 (*)    GFR, Estimated 19 (*)    All other components within normal limits  CBC - Abnormal; Notable for the following components:   RBC 3.71 (*)    Hemoglobin 9.9 (*)    HCT 33.6 (*)    MCHC 29.5 (*)    RDW 16.5 (*)    All other components within normal limits  TROPONIN I (HIGH SENSITIVITY) - Abnormal; Notable for the following components:   Troponin I (High Sensitivity) 165 (*)    All other components within normal limits  TROPONIN I (HIGH SENSITIVITY) - Abnormal; Notable for the following components:   Troponin I (High Sensitivity) 148 (*)    All  other components within normal limits  RESP PANEL BY RT-PCR (FLU A&B, COVID) ARPGX2  APTT  PROTIME-INR   ____________________________________________  EKG  ED ECG REPORT I, Naaman Plummer, the attending physician, personally viewed and interpreted this ECG.  Date: 10/16/2020 EKG Time: 0840 Rate:  110 Rhythm: Tachycardic sinus rhythm QRS Axis: normal Intervals: normal ST/T Wave abnormalities: normal Narrative Interpretation: no evidence of acute ischemia  ____________________________________________  RADIOLOGY  ED MD interpretation: 2 view chest x-ray shows no evidence of acute abnormalities including no pneumonia, pneumothorax, or widened mediastinum  Official radiology report(s): No results found.  ____________________________________________   PROCEDURES  Procedure(s) performed (including Critical Care):  .1-3 Lead EKG Interpretation  Date/Time: 10/16/2020 11:50 AM Performed by: Naaman Plummer, MD Authorized by: Naaman Plummer, MD     Interpretation: normal     ECG rate:  97   ECG rate assessment: normal     Rhythm: sinus rhythm     Ectopy: none     Conduction: normal   .Critical Care  Date/Time: 10/16/2020 11:50 AM Performed by: Naaman Plummer, MD Authorized by: Naaman Plummer, MD   Critical care provider statement:    Critical care time (minutes):  37   Critical care time was exclusive of:  Separately billable procedures and treating other patients   Critical care was necessary to treat or prevent imminent or life-threatening deterioration of the following conditions:  Cardiac failure   Critical care was time spent personally by me on the following activities:  Discussions with consultants, evaluation of patient's response to treatment, examination of patient, ordering and performing treatments and interventions, ordering and review of laboratory studies, ordering and review of radiographic studies, pulse oximetry, re-evaluation of patient's condition,  obtaining history from patient or surrogate and review of old charts   I assumed direction of critical care for this patient from another provider in my specialty: no     Care discussed with: admitting provider     ____________________________________________   INITIAL IMPRESSION / ASSESSMENT AND PLAN / ED COURSE  As part of my medical decision making, I reviewed the following data within the electronic medical record, if available:  Nursing notes reviewed and incorporated, Labs reviewed, EKG interpreted, Old chart reviewed, Radiograph reviewed and Notes from prior ED visits reviewed and incorporated        Workup: ECG, CXR, CBC, BMP, Troponin Findings: ECG: No overt evidence of STEMI. No evidence of Brugadas sign, delta wave, epsilon wave, significantly prolonged QTc, or malignant arrhythmia HS Troponin: Negative x1 Other Labs unremarkable for emergent problems. CXR: Without PTX, PNA, or widened mediastinum Last Stress Test:  2004 Last Heart Catheterization:  2004 HEART Score: 6  Given History, Exam, and Workup I have low suspicion for ACS, Pneumothorax, Pneumonia, Pulmonary Embolus, Tamponade, Aortic Dissection or other emergent problem as a cause for this presentation.   High Risk Chest Pain Patient at increased risk for Major Adverse Cardiac Event (AMI, PCI, CABG, death) Interventions: ASA 324mg  Heparin drip  Disposition: Admit for continued cardiac monitoring and trending of troponins as well as further evaluation for potential inpatient stress testing vs cardiac catheterization and coronary angiography.      ____________________________________________   FINAL CLINICAL IMPRESSION(S) / ED DIAGNOSES  Final diagnoses:  NSTEMI (non-ST elevated myocardial infarction) ([place])  Unstable angina pectoris [facility])     ED Discharge Orders     None        Note:  This document was prepared using Dragon voice recognition software and may include unintentional dictation  errors.    Naaman Plummer, MD 10/16/20 [id number]

## 2020-10-16 NOTE — Progress Notes (Signed)
ANTICOAGULATION CONSULT NOTE - Initial Consult  Pharmacy Consult for Heparin gtt Indication: chest pain/ACS  Allergies  Allergen Reactions   Other Rash    Pt reports allergy to metals.    Patient Measurements: Height: 5\' 5"  (165.1 cm) Weight: 94.1 kg (207 lb 7.3 oz) IBW/kg (Calculated) : 57 Heparin Dosing Weight: 78.1  Vital Signs: Temp: 98.4 F (36.9 C) (08/04 1717) Temp Source: Oral (08/04 1717) BP: 129/78 (08/04 1830) Pulse Rate: 100 (08/04 2015)  Labs: Recent Labs    10/16/20 0840 10/16/20 1027 10/16/20 2015  HGB 9.9*  --   --   HCT 33.6*  --   --   PLT 326  --   --   APTT 26  --   --   LABPROT 12.5  --   --   INR 0.9  --   --   HEPARINUNFRC  --   --  <0.10*  CREATININE 2.48*  --   --   TROPONINIHS 165* 148*  --      Estimated Creatinine Clearance: 21.2 mL/min (A) (by C-G formula based on SCr of 2.48 mg/dL (H)).   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 Estimates quit ~ 2015   GERD (gastroesophageal reflux disease)    HTN (hypertension)    Hyperlipidemia with target LDL less than 70    Hypothyroidism    subclinical. low TSH, normal thyroid panel. biopsy 2010   Vitamin B12 deficiency     Assessment: Patient presents to ED via AMS with chest pain. Patient recently discharged from admission in 7/5 for MI. She  refused option for heart catheterization, and  opted for medical management.  Today admitted with chest pain, troponin 165, Hgb 9.9 (stable since 7/5), HCT 33.6, PLT 326.  8/4 2015 HL < 0.10 1050 > 1300 units/hr  Goal of Therapy:  Heparin level 0.3-0.7 units/ml   Plan:  8/4 @ 2015 HL < 0.10, subtherapeutic -Heparin bolus 2350 units x1 - Increase heparin infusion to 1300 units/hr  -Check heparin level 8 hours following rate change and daily while on heparin  -Continue to monitor H/H and PLTs per protocol  Dorothe Pea, PharmD, BCPS Clinical Pharmacist   10/16/2020 9:26 PM

## 2020-10-16 NOTE — ED Notes (Signed)
Pt family updated at this time.

## 2020-10-16 NOTE — ED Notes (Signed)
Pt up to bedside commode

## 2020-10-16 NOTE — ED Notes (Signed)
Pt given warm blanket.

## 2020-10-16 NOTE — ED Notes (Signed)
Pt is asleep at this time. Pt refused to leave side rail up on left side due to her frequent use of the bathroom next to the bed.

## 2020-10-16 NOTE — Consult Note (Addendum)
[facility] Cardiology Consultation Note  Patient ID: Mckenzie Taylor, MRN: [id number], DOB/AGE: [id number] y.o. Admit date: 10/16/2020   Date of Consult: 10/16/2020 Primary Physician: Denton Lank, MD Primary Cardiologist: None  Chief Complaint:  Chief Complaint  Patient presents with   Chest Pain   Reason for Consult:  Chest pain, elevated troponin, NSTEMI  HPI: 80 y.o. female with a past medical history of coronary artery disease, chronic kidney disease stage IV, COPD, hyperlipidemia, type 2 diabetes, hypertension, chronic systolic heart failure who presented to the ED at approximately 2:30 in the morning complaining of retrosternal chest pain that is described as a burning sensation.  Patient states that the pain does not radiate anywhere and is associated with mild nausea, diaphoresis.  Patient denies any associated shortness of breath.  Patient was recently in the hospital on 09/18/2020 with the same problem and diagnosed with NSTEMI.  Patient had deferred left heart catheterization at that time after feeling better with medical management.  Patient has several risk factors for coronary artery disease including age, hypertension, hyperlipidemia, diabetes, family history of cardiac disease, remote smoking history quit 7 years ago.  Patient states that she has a history of cardiac catheterization with placement of stents many years ago.  Today patient states that she feels generally well and that her chest pain has been alleviated with use of heparin, nitroglycerin, morphine.  She does exhibit a mildly elevated troponin of 165.  Patient appears stable at this time.  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 Estimates quit ~ 2015   GERD (gastroesophageal reflux disease)    HTN (hypertension)    Hyperlipidemia with target LDL less than 70    Hypothyroidism    subclinical. low TSH, normal thyroid panel. biopsy 2010   Vitamin B12 deficiency       Surgical History:  Past Surgical History:  Procedure Laterality Date   CHOLECYSTECTOMY     hip replacement     bilateral-secondary to avascular necrosis   right eye lens replacement     ULNAR NERVE REPAIR     bilateral   VESICOVAGINAL FISTULA CLOSURE W/ TAH       Home Meds: Prior to Admission medications   Medication Sig Start Date End Date Taking? Authorizing Provider  albuterol (VENTOLIN HFA) 108 (90 Base) MCG/ACT inhaler Inhale 2 puffs into the lungs every 4 (four) hours as needed for wheezing or shortness of breath.   Yes [provider]  amLODipine (NORVASC) 10 MG tablet Take 10 mg by mouth daily.   Yes [provider]  aspirin EC 81 MG EC tablet Take 1 tablet (81 mg total) by mouth daily. 11/11/18  Yes Gladstone Lighter, MD  busPIRone (BUSPAR) 5 MG tablet Take 5 mg by mouth 2 (two) times daily.   Yes  [provider]  carvedilol (COREG) 6.25 MG tablet Take 1 tablet (6.25 mg total) by mouth 2 (two) times daily with a meal. 11/11/18  Yes Gladstone Lighter, MD  Cholecalciferol (VITAMIN D3) 5000 units CAPS Take 5,000 Units by mouth daily.   Yes [provider]  clopidogrel (PLAVIX) 75 MG tablet Take 1 tablet (75 mg total) by mouth daily. 09/19/20 12/18/20 Yes Enzo Bi, MD  Cyanocobalamin 1500 MCG TBDP Take 4,500 mcg by mouth daily.   Yes [provider]  Dulaglutide (TRULICITY) 1.5 OE/4.2PN SOPN  Inject 1.5 mg into the skin every Thursday.   Yes [provider]  Fluticasone-Umeclidin-Vilant 100-62.5-25 MCG/INH AEPB Inhale 1 puff into the lungs daily.   Yes [provider]  insulin detemir (LEVEMIR) 100 UNIT/ML FlexPen Inject 42 Units into the skin at bedtime.   Yes [provider]  insulin lispro (HUMALOG) 100 UNIT/ML KwikPen Inject 0-22 Units into the skin 3 (three) times daily with meals. Sliding scale 10/26/18  Yes [provider]  isosorbide-hydrALAZINE (BIDIL) 20-37.5 MG tablet Take 0.5 tablets by mouth 3 (three) times daily. 09/18/20 12/17/20 Yes Enzo Bi, MD  metolazone (ZAROXOLYN) 2.5 MG tablet Take 1 tablet (2.5 mg total) by mouth daily as needed (for weight gain > 3 pounds in 1 day). 11/11/18  Yes Gladstone Lighter, MD  omeprazole (PRILOSEC) 20 MG capsule Take 20 mg by mouth daily.   Yes [provider]  oxyCODONE-acetaminophen (PERCOCET/ROXICET) 5-325 MG tablet Take 1 tablet by mouth at bedtime. 09/08/20  Yes [provider]  promethazine (PHENERGAN) 25 MG tablet Take 25 mg by mouth every 6 (six) hours as needed for nausea.    Yes [provider]  rosuvastatin (CRESTOR) 10 MG tablet Take 10 mg by mouth daily.   Yes [provider]  tiZANidine (ZANAFLEX) 4 MG tablet Take 4 mg by mouth 3 (three) times daily as needed for muscle spasms.    Yes [provider]  ipratropium-albuterol (DUONEB) 0.5-2.5 (3) MG/3ML SOLN Take 3 mLs by nebulization 4 (four) times daily. Patient not taking: No sig reported 03/28/16   Vaughan Basta, MD  torsemide (DEMADEX) 20 MG tablet Take 1 tablet (20 mg total) by mouth daily. Patient not taking: No sig reported 11/12/18   Gladstone Lighter, MD    Inpatient Medications:   aspirin EC  81 mg Oral Daily   atorvastatin  40 mg Oral QHS   budesonide (PULMICORT) nebulizer solution  0.25 mg Nebulization BID   And   [START ON 10/17/2020] umeclidinium-vilanterol  1 puff Inhalation  Daily   busPIRone  5 mg Oral BID   carvedilol  6.25 mg Oral BID WC   [START ON 10/17/2020] cholecalciferol  5,000 Units Oral Daily   clopidogrel  75 mg Oral Daily   insulin aspart  0-9 Units Subcutaneous TID WC   insulin detemir  43 Units Subcutaneous QHS   isosorbide-hydrALAZINE  0.5 tablet Oral TID   oxyCODONE-acetaminophen  1 tablet Oral QHS   [START ON 10/17/2020] pantoprazole  40 mg Oral Daily   sodium chloride flush  3 mL Intravenous Q12H   traZODone  50 mg Oral QHS   [START ON 10/17/2020] vitamin B-12  1,500 mcg Oral Daily    sodium chloride     heparin 1,050 Units/hr (10/16/20 1157)    Allergies:  Allergies  Allergen Reactions   Other Rash    Pt reports allergy to metals.    Social History   Socioeconomic History   Marital status: Married  Spouse name: Not on file   Number of children: Not on file   Years of education: Not on file   Highest education level: Not on file  Occupational History   Not on file  Tobacco Use   Smoking status: Former    Packs/day: 1.00    Years: 50.00    Pack years: 50.00    Types: Cigarettes   Smokeless tobacco: Never   Tobacco comments:    1 ppd - 50 years   Substance and Sexual Activity   Alcohol use: No   Drug use: No   Sexual activity: Not Currently  Other Topics Concern   Not on file  Social History Narrative   Separated, has 2 adult children.    Retired Regulatory affairs officer, on disability.    Social Determinants of Health   Financial Resource Strain: Not on file  Food Insecurity: Not on file  Transportation Needs: Not on file  Physical Activity: Not on file  Stress: Not on file  Social Connections: Not on file  Intimate Partner Violence: Not on file     Family History  Problem Relation Age of Onset   Cancer Father        lung   COPD Mother    Heart disease Mother    Heart disease Maternal Uncle      Review of Systems Positive for chest pain, shortness of breath Negative for: General:  chills, fever, night sweats or  weight changes.  Cardiovascular: PND orthopnea syncope dizziness  Dermatological skin lesions rashes Respiratory: Cough congestion Urologic: Frequent urination urination at night and hematuria Abdominal: negative for nausea, vomiting, diarrhea, bright red blood per rectum, melena, or hematemesis Neurologic: negative for visual changes, and/or hearing changes  All other systems reviewed and are otherwise negative except as noted above.  Labs: No results for input(s): CKTOTAL, CKMB, TROPONINI in the last 72 hours. Lab Results  Component Value Date   WBC 9.5 10/16/2020   HGB 9.9 (L) 10/16/2020   HCT 33.6 (L) 10/16/2020   MCV 90.6 10/16/2020   PLT 326 10/16/2020    Recent Labs  Lab 10/16/20 0840  NA 139  K 4.3  CL 97*  CO2 31  BUN 22  CREATININE 2.48*  CALCIUM 8.9  GLUCOSE 320*   Lab Results  Component Value Date   CHOL 259 (H) 09/17/2020   HDL 43 09/17/2020   LDLCALC 142 (H) 09/17/2020   TRIG 370 (H) 09/17/2020   Lab Results  Component Value Date   DDIMER 0.67 (H) 09/16/2020    Radiology/Studies:  No results found.  EKG: Sinus tachycardia at a rate of 110 bpm with nonspecific ST and T wave abnormalities.  Weights: Filed Weights   10/16/20 0833  Weight: 94.1 kg     Physical Exam: Blood pressure (!) 153/69, pulse 88, temperature 98.7 F (37.1 C), resp. rate 18, height 5\' 5"  (1.651 m), weight 94.1 kg, SpO2 100 %. Body mass index is 34.52 kg/m. General: Well developed, well nourished, in no acute distress. Head eyes ears nose throat: Normocephalic, atraumatic, sclera non-icteric, no xanthomas, nares are without discharge. No apparent thyromegaly and/or mass  Lungs: Normal respiratory effort.  no wheezes, no rales, no rhonchi.  Heart: RRR with normal S1 S2. no murmur gallop, no rub, PMI is normal size and placement, carotid upstroke normal without bruit, jugular venous pressure is normal Abdomen: Soft, non-tender, non-distended with normoactive bowel sounds. No  hepatomegaly. No rebound/guarding. No obvious abdominal masses. Abdominal aorta is normal size without bruit  Extremities: No edema. no cyanosis, no clubbing, no ulcers  Peripheral : 2+ bilateral upper extremity pulses, 2+ bilateral femoral pulses, 2+ bilateral dorsal pedal pulse Neuro: Alert and oriented. No facial asymmetry. No focal deficit. Moves all extremities spontaneously. Musculoskeletal: Normal muscle tone without kyphosis Psych:  Responds to questions appropriately with a normal affect.    Assessment: 80 year old female with significant past medical history of coronary artery disease and previous stenting coming in with symptoms of chest pain concerning for NSTEMI.  Patient admitted for similar issue in July and declined left heart catheterization.  Plan: -Continue IV heparin as previously prescribed -Continue supportive care with oxygen, nitroglycerin, morphine as needed -Proceed to left heart catheterization with coronary angiography and possible PCI and stenting.  Hopefully scheduled for tomorrow  Signed, Jettie Booze [facility] Cardiology 10/16/2020, 3:45 PM The patient has been interviewed and examined. I agree with assessment and plan above. Serafina Royals MD [facility]

## 2020-10-16 NOTE — Progress Notes (Signed)
ANTICOAGULATION CONSULT NOTE - Initial Consult  Pharmacy Consult for Heparin gtt Indication: chest pain/ACS  Allergies  Allergen Reactions   Other Rash    Pt reports allergy to metals.    Patient Measurements: Height: 5\' 5"  (165.1 cm) Weight: 94.1 kg (207 lb 7.3 oz) IBW/kg (Calculated) : 57 Heparin Dosing Weight: 78.1  Vital Signs: Temp: 98.7 F (37.1 C) (08/04 0841) BP: 144/85 (08/04 0953) Pulse Rate: 99 (08/04 0953)  Labs: Recent Labs    10/16/20 0840  HGB 9.9*  HCT 33.6*  PLT 326  CREATININE 2.48*  TROPONINIHS 165*    Estimated Creatinine Clearance: 21.2 mL/min (A) (by C-G formula based on SCr of 2.48 mg/dL (H)).   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 Estimates quit ~ 2015   GERD (gastroesophageal reflux disease)    HTN (hypertension)    Hyperlipidemia with target LDL less than 70    Hypothyroidism    subclinical. low TSH, normal thyroid panel. biopsy 2010   Vitamin B12 deficiency     Assessment: Patient presents to ED via AMS with chest pain. Patient recently discharged from admission in 7/5 for MI. She  refused option for heart catheterization, and opted for medical management.  Today admitted with chest pain, troponin 165, Hgb 9.9 (stable since 7/5), HCT 33.6, PLT 326.  Goal of Therapy:  Heparin level 0.3-0.7 units/ml   Plan:  -PTT and PT stat orders  placed. -CBC completed prior to consult.  -Heparin bolus 4000 units x1, follow by heparin gtt at 12-14u/kg/hr (1050u/hr).  -Check heparin level in 8 hours and daily while on heparin  -Continue to monitor H/H and PLTs per protocol  Donell Tomkins Rodriguez-Guzman PharmD, BCPS 10/16/2020 10:45 AM

## 2020-10-16 NOTE — ED Triage Notes (Signed)
Pt comes into the ED via [facility] from home c/o central chest pain that is sharp in nature.  12 lead unremarkable but had sinus tach at 104.  324 ASA given.  20g L AC. 130/80.  No nitro given.  Pt does admit it radiates into her shoulder with some numbness.  Denies any nausea or vomiting.  No SHOB but does wear O2 chronically at 3L.  Pt ambulatory to triage.

## 2020-10-17 ENCOUNTER — Encounter
Admission: EM | Disposition: A | Discharge status: Home Health | Payer: Self-pay | Source: Home / Self Care | Attending: Student

## 2020-10-17 ENCOUNTER — Other Ambulatory Visit: Payer: Self-pay

## 2020-10-17 ENCOUNTER — Encounter: Payer: Self-pay | Admitting: Internal Medicine

## 2020-10-17 HISTORY — PX: LEFT HEART CATH AND CORONARY ANGIOGRAPHY: CATH118249

## 2020-10-17 LAB — GLUCOSE, CAPILLARY
Glucose-Capillary: 208 mg/dL — ABNORMAL HIGH (ref 70–99)
Glucose-Capillary: 171 mg/dL — ABNORMAL HIGH (ref 70–99)
Glucose-Capillary: 132 mg/dL — ABNORMAL HIGH (ref 70–99)
Glucose-Capillary: 124 mg/dL — ABNORMAL HIGH (ref 70–99)
Glucose-Capillary: 248 mg/dL — ABNORMAL HIGH (ref 70–99)

## 2020-10-17 LAB — VITAMIN D 25 HYDROXY (VIT D DEFICIENCY, FRACTURES): Vit D, 25-Hydroxy: 24.35 ng/mL — ABNORMAL LOW (ref 30–100)

## 2020-10-17 LAB — VITAMIN B12: Vitamin B-12: 219 pg/mL (ref 180–914)

## 2020-10-17 LAB — CBC
HCT: 31.4 % — ABNORMAL LOW (ref 36.0–46.0)
Hemoglobin: 9.3 g/dL — ABNORMAL LOW (ref 12.0–15.0)
MCH: 27.4 pg (ref 26.0–34.0)
MCHC: 29.6 g/dL — ABNORMAL LOW (ref 30.0–36.0)
MCV: 92.6 fL (ref 80.0–100.0)
Platelets: 315 10*3/uL (ref 150–400)
RBC: 3.39 MIL/uL — ABNORMAL LOW (ref 3.87–5.11)
RDW: 16.4 % — ABNORMAL HIGH (ref 11.5–15.5)
WBC: 8.9 10*3/uL (ref 4.0–10.5)
nRBC: 0 % (ref 0.0–0.2)

## 2020-10-17 LAB — BASIC METABOLIC PANEL
Anion gap: 5 (ref 5–15)
BUN: 26 mg/dL — ABNORMAL HIGH (ref 8–23)
CO2: 30 mmol/L (ref 22–32)
Calcium: 8.4 mg/dL — ABNORMAL LOW (ref 8.9–10.3)
Chloride: 100 mmol/L (ref 98–111)
Creatinine, Ser: 2.46 mg/dL — ABNORMAL HIGH (ref 0.44–1.00)
GFR, Estimated: 20 mL/min — ABNORMAL LOW (ref >60)
Glucose, Bld: 217 mg/dL — ABNORMAL HIGH (ref 70–99)
Potassium: 5.2 mmol/L — ABNORMAL HIGH (ref 3.5–5.1)
Sodium: 135 mmol/L (ref 135–145)

## 2020-10-17 LAB — IRON AND TIBC
Iron: 39 ug/dL (ref 28–170)
Saturation Ratios: 10 % — ABNORMAL LOW (ref 10.4–31.8)
TIBC: 386 ug/dL (ref 250–450)
UIBC: 347 ug/dL

## 2020-10-17 LAB — HEPARIN LEVEL (UNFRACTIONATED): Heparin Unfractionated: 0.1 IU/mL — ABNORMAL LOW (ref 0.30–0.70)

## 2020-10-17 LAB — FOLATE: Folate: 9.9 ng/mL (ref >5.9)

## 2020-10-17 SURGERY — LEFT HEART CATH AND CORONARY ANGIOGRAPHY
Anesthesia: Moderate Sedation

## 2020-10-17 MED ORDER — HEPARIN BOLUS VIA INFUSION
2300.0000 [IU] | Freq: Once | INTRAVENOUS | Status: AC
  Administered 2020-10-17: 2300 [IU] via INTRAVENOUS
  Filled 2020-10-17: qty 2300

## 2020-10-17 MED ORDER — ACETAMINOPHEN 325 MG PO TABS
650.0000 mg | ORAL_TABLET | ORAL | Status: DC | PRN
  Administered 2020-10-19: 650 mg via ORAL
  Filled 2020-10-17: qty 2

## 2020-10-17 MED ORDER — HEPARIN (PORCINE) IN NACL 2000-0.9 UNIT/L-% IV SOLN
INTRAVENOUS | Status: DC | PRN
Start: 2020-10-17 — End: 2020-10-17
  Administered 2020-10-17: 1000 mL

## 2020-10-17 MED ORDER — SODIUM CHLORIDE 0.9 % WEIGHT BASED INFUSION
1.0000 mL/kg/h | INTRAVENOUS | Status: AC
  Administered 2020-10-17: 1 mL/kg/h via INTRAVENOUS

## 2020-10-17 MED ORDER — SODIUM CHLORIDE 0.9 % WEIGHT BASED INFUSION
1.0000 mL/kg/h | INTRAVENOUS | Status: DC
  Administered 2020-10-17 (×2): 1 mL/kg/h via INTRAVENOUS

## 2020-10-17 MED ORDER — HEPARIN (PORCINE) IN NACL 1000-0.9 UT/500ML-% IV SOLN
INTRAVENOUS | Status: AC
  Filled 2020-10-17: qty 1000

## 2020-10-17 MED ORDER — SODIUM CHLORIDE 0.9% FLUSH: 3.0000 mL | INTRAVENOUS | Status: DC | PRN

## 2020-10-17 MED ORDER — NITROGLYCERIN 0.4 MG SL SUBL
SUBLINGUAL_TABLET | SUBLINGUAL | Status: AC
  Filled 2020-10-17: qty 1

## 2020-10-17 MED ORDER — SODIUM CHLORIDE 0.9 % WEIGHT BASED INFUSION
3.0000 mL/kg/h | INTRAVENOUS | Status: AC
  Administered 2020-10-17: 3 mL/kg/h via INTRAVENOUS

## 2020-10-17 MED ORDER — LIDOCAINE HCL (PF) 1 % IJ SOLN
INTRAMUSCULAR | Status: AC
  Filled 2020-10-17: qty 30

## 2020-10-17 MED ORDER — ASPIRIN 81 MG PO CHEW
81.0000 mg | CHEWABLE_TABLET | ORAL | Status: DC
Start: 2020-10-18 — End: 2020-10-17

## 2020-10-17 MED ORDER — SODIUM CHLORIDE 0.9 % IV SOLN: 250.0000 mL | INTRAVENOUS | Status: DC | PRN

## 2020-10-17 MED ORDER — NITROGLYCERIN 0.4 MG SL SUBL
SUBLINGUAL_TABLET | SUBLINGUAL | Status: DC | PRN
Start: 2020-10-17 — End: 2020-10-17
  Administered 2020-10-17: .4 mg via SUBLINGUAL

## 2020-10-17 MED ORDER — FENTANYL CITRATE (PF) 100 MCG/2ML IJ SOLN
INTRAMUSCULAR | Status: AC
  Filled 2020-10-17: qty 2

## 2020-10-17 MED ORDER — HYDRALAZINE HCL 20 MG/ML IJ SOLN: 10.0000 mg | INTRAMUSCULAR | Status: AC | PRN

## 2020-10-17 MED ORDER — SODIUM ZIRCONIUM CYCLOSILICATE 10 G PO PACK
10.0000 g | PACK | Freq: Two times a day (BID) | ORAL | Status: AC
  Administered 2020-10-17 (×2): 10 g via ORAL
  Filled 2020-10-17 (×2): qty 1

## 2020-10-17 MED ORDER — FENTANYL CITRATE (PF) 100 MCG/2ML IJ SOLN
INTRAMUSCULAR | Status: DC | PRN
  Administered 2020-10-17: 50 ug via INTRAVENOUS

## 2020-10-17 MED ORDER — SODIUM CHLORIDE 0.9 % WEIGHT BASED INFUSION: 1.0000 mL/kg/h | INTRAVENOUS | Status: DC

## 2020-10-17 MED ORDER — IOHEXOL 300 MG/ML  SOLN
INTRAMUSCULAR | Status: DC | PRN
  Administered 2020-10-17: 78 mL

## 2020-10-17 MED ORDER — LIDOCAINE HCL (PF) 1 % IJ SOLN
INTRAMUSCULAR | Status: DC | PRN
  Administered 2020-10-17: 15 mL

## 2020-10-17 MED ORDER — SODIUM CHLORIDE 0.9 % WEIGHT BASED INFUSION
3.0000 mL/kg/h | INTRAVENOUS | Status: DC
Start: 2020-10-18 — End: 2020-10-17

## 2020-10-17 MED ORDER — ONDANSETRON HCL 4 MG/2ML IJ SOLN: 4.0000 mg | Freq: Four times a day (QID) | INTRAMUSCULAR | Status: DC | PRN

## 2020-10-17 MED ORDER — ASPIRIN 81 MG PO CHEW
81.0000 mg | CHEWABLE_TABLET | ORAL | Status: AC
  Administered 2020-10-17: 81 mg via ORAL
  Filled 2020-10-17: qty 1

## 2020-10-17 MED ORDER — MIDAZOLAM HCL 2 MG/2ML IJ SOLN
INTRAMUSCULAR | Status: AC
  Filled 2020-10-17: qty 2

## 2020-10-17 MED ORDER — LABETALOL HCL 5 MG/ML IV SOLN: 10.0000 mg | INTRAVENOUS | Status: AC | PRN

## 2020-10-17 MED ORDER — MIDAZOLAM HCL 2 MG/2ML IJ SOLN
INTRAMUSCULAR | Status: DC | PRN
  Administered 2020-10-17: 1.5 mg via INTRAVENOUS

## 2020-10-17 SURGICAL SUPPLY — 13 items
CATH INFINITI 5 FR 3DRC (CATHETERS) ×2 IMPLANT
CATH INFINITI 5 FR JL3.5 (CATHETERS) ×2 IMPLANT
CATH INFINITI 5FR ANG PIGTAIL (CATHETERS) ×2 IMPLANT
CATH INFINITI 5FR JL4 (CATHETERS) ×2 IMPLANT
CATH INFINITI JR4 5F (CATHETERS) ×2 IMPLANT
DEVICE CLOSURE MYNXGRIP 5F (Vascular Products) ×2 IMPLANT
NEEDLE PERC 18GX7CM (NEEDLE) ×2 IMPLANT
PACK CARDIAC CATH (CUSTOM PROCEDURE TRAY) ×2 IMPLANT
PROTECTION STATION PRESSURIZED (MISCELLANEOUS) ×2
SET ATX SIMPLICITY (MISCELLANEOUS) ×2 IMPLANT
SHEATH AVANTI 5FR X 11CM (SHEATH) ×2 IMPLANT
STATION PROTECTION PRESSURIZED (MISCELLANEOUS) ×1 IMPLANT
WIRE GUIDERIGHT .035X150 (WIRE) ×2 IMPLANT

## 2020-10-17 NOTE — Progress Notes (Signed)
[facility] Encounter Note  Patient: Mckenzie Taylor / Admit Date: 10/16/2020 / Date of Encounter: 10/17/2020, 4:25 PM   Subjective: Patient has felt better since admission but has intermittent episodes of chest discomfort.  Cardiac catheterization showing increased end-diastolic pressures consistent with diastolic dysfunction.  Diffuse moderate atherosclerosis with subtotal occlusion of diagonal artery and distal LAD stenoses possibly causing symptoms at this time  Discussion with interventional team agrees with medical management due to moderate atherosclerosis and no critical coronary artery disease requiring further intervention  Review of Systems: Positive for: Shortness of breath Negative for: Vision change, hearing change, syncope, dizziness, nausea, vomiting,diarrhea, bloody stool, stomach pain, cough, congestion, diaphoresis, urinary frequency, urinary pain,skin lesions, skin rashes Others previously listed  Objective: Telemetry: Normal sinus rhythm Physical Exam: Blood pressure 127/81, pulse 84, temperature 97.7 F (36.5 C), resp. rate 18, height 5\' 5"  (1.651 m), weight 94.2 kg, SpO2 100 %. Body mass index is 34.56 kg/m. General: Well developed, well nourished, in no acute distress. Head: Normocephalic, atraumatic, sclera non-icteric, no xanthomas, nares are without discharge. Neck: No apparent masses Lungs: Normal respirations with no wheezes, no rhonchi, no rales , no crackles   Heart: Regular rate and rhythm, normal S1 S2, no murmur, no rub, no gallop, PMI is normal size and placement, carotid upstroke normal without bruit, jugular venous pressure normal Abdomen: Soft, non-tender, non-distended with normoactive bowel sounds. No hepatosplenomegaly. Abdominal aorta is normal size without bruit Extremities: No edema, no clubbing, no cyanosis, no ulcers,  Peripheral: 2+ radial, 2+ femoral, 2+ dorsal pedal pulses Neuro: Alert and oriented. Moves all  extremities spontaneously. Psych:  Responds to questions appropriately with a normal affect.   Intake/Output Summary (Last 24 hours) at 10/17/2020 1625 Last data filed at 10/17/2020 0500 Gross per 24 hour  Intake 420 ml  Output 250 ml  Net 170 ml    Inpatient Medications:   [MAR Hold] aspirin EC  81 mg Oral Daily   [MAR Hold] atorvastatin  40 mg Oral QHS   [MAR Hold] budesonide (PULMICORT) nebulizer solution  0.25 mg Nebulization BID   And   [MAR Hold] umeclidinium-vilanterol  1 puff Inhalation Daily   [MAR Hold] busPIRone  5 mg Oral BID   [MAR Hold] carvedilol  6.25 mg Oral BID WC   [MAR Hold] cholecalciferol  5,000 Units Oral Daily   [MAR Hold] clopidogrel  75 mg Oral Daily   [MAR Hold] insulin aspart  0-9 Units Subcutaneous TID WC   [MAR Hold] insulin detemir  43 Units Subcutaneous QHS   [MAR Hold] isosorbide-hydrALAZINE  0.5 tablet Oral TID   [MAR Hold] oxyCODONE-acetaminophen  1 tablet Oral QHS   [MAR Hold] pantoprazole  40 mg Oral Daily   [MAR Hold] sodium chloride flush  3 mL Intravenous Q12H   [MAR Hold] sodium chloride flush  3 mL Intravenous Q12H   [MAR Hold] sodium zirconium cyclosilicate  10 g Oral BID   [MAR Hold] traZODone  50 mg Oral QHS   [MAR Hold] vitamin B-12  1,500 mcg Oral Daily   Infusions:   [MAR Hold] sodium chloride     sodium chloride     sodium chloride 1 mL/kg/hr (10/17/20 1345)   heparin Stopped (10/17/20 1340)   [MAR Hold] promethazine (PHENERGAN) injection (IM or IVPB)      Labs: Recent Labs    10/16/20 0840 10/17/20 0540  NA 139 135  K 4.3 5.2*  CL 97* 100  CO2 31 30  GLUCOSE 320* 217*  BUN 22 26*  CREATININE 2.48* 2.46*  CALCIUM 8.9 8.4*   No results for input(s): AST, ALT, ALKPHOS, BILITOT, PROT, ALBUMIN in the last 72 hours. Recent Labs    10/16/20 0840 10/17/20 0540  WBC 9.5 8.9  HGB 9.9* 9.3*  HCT 33.6* 31.4*  MCV 90.6 92.6  PLT 326 315   No results for input(s): CKTOTAL, CKMB, TROPONINI in the last 72 hours. Invalid  input(s): POCBNP No results for input(s): HGBA1C in the last 72 hours.   Weights: Filed Weights   10/16/20 7622 10/17/20 0532  Weight: 94.1 kg 94.2 kg     Radiology/Studies:  DG Chest 2 View  Result Date: 10/16/2020 CLINICAL DATA:  Chest pain. EXAM: CHEST - 2 VIEW COMPARISON:  08/17/2020. FINDINGS: Mediastinum and hilar structures normal. Low lung volumes with bibasilar atelectasis. No pleural effusion or pneumothorax. Stable cardiomegaly. No pulmonary venous congestion. No acute bony abnormality. IMPRESSION: 1.  Stable cardiomegaly.  No pulmonary venous congestion. 2.  Low lung volumes with bibasilar atelectasis. Electronically Signed   By: Marcello Moores  Register   On: 10/16/2020 09:31     Assessment and Recommendation  80 y.o. female with hypertension hyperlipidemia and non-ST elevation myocardial infarction with diastolic dysfunction heart failure having no evidence of critical coronary artery disease by cardiac catheterization 1.  Isosorbide beta-blocker and calcium channel blocker for anginal symptoms 2.  High intensity cholesterol therapy 3.  Cardiac rehabilitation 4.  Dual antiplatelet therapy  Signed, Serafina Royals M.D. FACC

## 2020-10-17 NOTE — Plan of Care (Signed)

## 2020-10-17 NOTE — Progress Notes (Signed)
[facility] Progress Note  Patient: Mckenzie Taylor    TUU:[id number]  DOA: 10/16/2020     Date of Service: the patient was seen and examined on 10/17/2020  Chief Complaint  Patient presents with   Chest Pain   Brief hospital course: Mckenzie Taylor is a 80 y.o. female with medical history significant of CAD, CKD stage IV, COPD on 2-3 L of oxygen at home, chronic systolic heart failure, DM-2 who presented to the ED for evaluation of chest pain.  Per patient-she woke up around 2:30 in the AM with retrosternal chest pain.  She describes the pain as squeezing-not radiating-associated with some mild diaphoresis and nausea.  She describes the pain as 10/10-however when she first presented to the ED-she was provided supportive care-currently the pain has decreased to 3/10.  She appears comfortable.    ED Course: Found to have minimally elevated troponin-she was recently admitted to the hospital last month for similar issues-and declined a cardiac cath.  ED MD spoke with cardiology on-call-recommendations were to start IV heparin and other supportive care-cardiology will formally consult and provide further recommendations.   Assessment and Plan:  NSTEMI, presented with chest pain, pt has known CAD-recently admitted in July for similar issues-and had declined LHC.  A Continue IV heparin infusion Continue antiplatelet agents, statin, beta-blocker Cardiology consulted, plan for cardiac catheter today. Keep n.p.o.    COPD with chronic hypoxemic respiratory failure on 2-3 L of oxygen at home: Stable-not in exacerbation-continue usual bronchodilator regimen   Insulin-dependent DM-2: Resume Lantus-start SSI-follow CBG trend and adjust accordingly.   HTN: BP stable-resume Imdur/beta-blocker-follow and adjust   Chronic systolic heart failure: Euvolemic on exam-hold diuretics as patient might need LHC.   CKD stage IV: Creatinine close to baseline-watch closely if patient does not require  LHC-at risk for kidney injury. Mildly elevated potassium, Lokelma x2 doses ordered   Anemia of chronic disease, Hb 9.3 on 8/5 Monitor H&H, follow anemia work-up   GERD: Continue PPI   Body mass index is 34.56 kg/m.  Interventions:       Diet: Heart healthy/carb modified, n.p.o. for cardiac cath DVT Prophylaxis: Heparin IV infusion  Advance goals of care discussion: Full code  Family Communication: family was NOT present at bedside, at the time of interview.  The pt provided permission to discuss medical plan with the family. Opportunity was given to ask question and all questions were answered satisfactorily.   Disposition:  Pt is from home, admitted with chest pain, still has chest pain,, which precludes a safe discharge. Discharge to home, when cleared by cardiology.  Scheduled for cardiac cath today  Subjective: No overnight events, in the morning time patient was having chest pain which was resolved after nitroglycerin.  And also patient received morphine. Patient denied any shortness of breath, no palpitation, no abdominal pain, no nausea vomiting.   Physical Exam: General:  alert oriented to time, place, and person.  Appear in mild distress, affect appropriate Eyes: PERRLA ENT: Oral Mucosa Clear, moist  Neck: no JVD,  Cardiovascular: S1 and S2 Present, audible murmur,  Respiratory: good respiratory effort, Bilateral Air entry equal and Decreased, no Crackles, no wheezes Abdomen: Bowel Sound present, Soft and no tenderness,  Skin: no rashes Extremities: no Pedal edema, no calf tenderness Neurologic: without any new focal findings Gait not checked due to patient safety concerns  Vitals:   10/17/20 0730 10/17/20 0738 10/17/20 1129 10/17/20 1339  BP: (!) 191/104 139/84  136/75  Pulse:  98 78  81  Resp:  20  (!) 21  Temp:  97.9 F (36.6 C) 97.8 F (36.6 C) 97.7 F (36.5 C)  TempSrc:  Oral    SpO2:  98% 100% 100%  Weight:      Height:        Intake/Output  Summary (Last 24 hours) at 10/17/2020 1417 Last data filed at 10/17/2020 0500 Gross per 24 hour  Intake 420 ml  Output 250 ml  Net 170 ml   Filed Weights   10/16/20 0833 10/17/20 0532  Weight: 94.1 kg 94.2 kg    Data Reviewed: I have personally reviewed and interpreted daily labs, tele strips, imagings as discussed above. I reviewed all nursing notes, pharmacy notes, vitals, pertinent old records I have discussed plan of care as described above with RN and patient/family.  CBC: Recent Labs  Lab 10/16/20 0840 10/17/20 0540  WBC 9.5 8.9  HGB 9.9* 9.3*  HCT 33.6* 31.4*  MCV 90.6 92.6  PLT 326 025   Basic Metabolic Panel: Recent Labs  Lab 10/16/20 0840 10/17/20 0540  NA 139 135  K 4.3 5.2*  CL 97* 100  CO2 31 30  GLUCOSE 320* 217*  BUN 22 26*  CREATININE 2.48* 2.46*  CALCIUM 8.9 8.4*    Studies: No results found.  Scheduled Meds:  [MAR Hold] aspirin EC  81 mg Oral Daily   [MAR Hold] atorvastatin  40 mg Oral QHS   [MAR Hold] budesonide (PULMICORT) nebulizer solution  0.25 mg Nebulization BID   And   [MAR Hold] umeclidinium-vilanterol  1 puff Inhalation Daily   [MAR Hold] busPIRone  5 mg Oral BID   [MAR Hold] carvedilol  6.25 mg Oral BID WC   [MAR Hold] cholecalciferol  5,000 Units Oral Daily   [MAR Hold] clopidogrel  75 mg Oral Daily   [MAR Hold] insulin aspart  0-9 Units Subcutaneous TID WC   [MAR Hold] insulin detemir  43 Units Subcutaneous QHS   [MAR Hold] isosorbide-hydrALAZINE  0.5 tablet Oral TID   [MAR Hold] oxyCODONE-acetaminophen  1 tablet Oral QHS   [MAR Hold] pantoprazole  40 mg Oral Daily   [MAR Hold] sodium chloride flush  3 mL Intravenous Q12H   [MAR Hold] sodium chloride flush  3 mL Intravenous Q12H   [MAR Hold] sodium zirconium cyclosilicate  10 g Oral BID   [MAR Hold] traZODone  50 mg Oral QHS   [MAR Hold] vitamin B-12  1,500 mcg Oral Daily   Continuous Infusions:  [MAR Hold] sodium chloride     sodium chloride     sodium chloride 1  mL/kg/hr (10/17/20 1345)   heparin Stopped (10/17/20 1340)   [MAR Hold] promethazine (PHENERGAN) injection (IM or IVPB)     PRN Meds: [MAR Hold] sodium chloride, sodium chloride, [MAR Hold] acetaminophen **OR** [MAR Hold] acetaminophen, [MAR Hold] albuterol, [MAR Hold] ipratropium-albuterol, [MAR Hold]  morphine injection, [MAR Hold] nitroGLYCERIN, [MAR Hold] ondansetron **OR** [MAR Hold] ondansetron (ZOFRAN) IV, [MAR Hold] promethazine (PHENERGAN) injection (IM or IVPB), [MAR Hold] sodium chloride flush, sodium chloride flush, [MAR Hold] tiZANidine  Time spent: 35 minutes  Author: Val Riles. MD [facility] 10/17/2020 2:17 PM  To reach On-call, see care teams to locate the attending and reach out to them via [email or web address]. If 7PM-7AM, please contact night-coverage If you still have difficulty reaching the attending provider, please page the [facility] (Director on Call) for [facility] on amion for assistance.

## 2020-10-17 NOTE — Progress Notes (Signed)
ANTICOAGULATION CONSULT NOTE - Initial Consult  Pharmacy Consult for Heparin gtt Indication: chest pain/ACS  Allergies  Allergen Reactions   Other Rash    Pt reports allergy to metals.    Patient Measurements: Height: 5\' 5"  (165.1 cm) Weight: 94.2 kg (207 lb 10.8 oz) IBW/kg (Calculated) : 57 Heparin Dosing Weight: 78.1  Vital Signs: Temp: 98.2 F (36.8 C) (08/05 0300) Temp Source: Oral (08/05 0150) BP: 135/81 (08/05 0532) Pulse Rate: 98 (08/05 0501)  Labs: Recent Labs    10/16/20 0840 10/16/20 1027 10/16/20 2015 10/17/20 0540  HGB 9.9*  --   --  9.3*  HCT 33.6*  --   --  31.4*  PLT 326  --   --  315  APTT 26  --   --   --   LABPROT 12.5  --   --   --   INR 0.9  --   --   --   HEPARINUNFRC  --   --  <0.10* 0.10*  CREATININE 2.48*  --   --  2.46*  TROPONINIHS 165* 148*  --   --      Estimated Creatinine Clearance: 21.4 mL/min (A) (by C-G formula based on SCr of 2.46 mg/dL (H)).   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 Estimates quit ~ 2015   GERD (gastroesophageal reflux disease)    HTN (hypertension)    Hyperlipidemia with target LDL less than 70    Hypothyroidism    subclinical. low TSH, normal thyroid panel. biopsy 2010   Vitamin B12 deficiency     Assessment: Patient presents to ED via AMS with chest pain. Patient recently discharged from admission in 7/5  for MI. She  refused option for heart catheterization, and opted for medical management.  Today admitted with chest pain, troponin 165, Hgb 9.9 (stable since 7/5), HCT 33.6, PLT 326.  8/4 2015 HL < 0.10 1050 > 1300 units/hr  Goal of Therapy:  Heparin level 0.3-0.7 units/ml   Plan:  8/5:  HL @ 0540 = 0.1 Will order heparin 2300 units IV X 1 and increase drip rate to 1600 units/hr.  Will recheck HL 8 hrs after rate change  Allsion Nogales D, PharmD Clinical Pharmacist   10/17/2020 6:43 AM

## 2020-10-17 NOTE — Progress Notes (Signed)
Patient with c/o 10/10 chest pain. Nitro SL given x2. After second nitro, patient chest pain free. Will make cardiology aware.  Plan is for cardiac cath today.

## 2020-10-18 LAB — GLUCOSE, CAPILLARY
Glucose-Capillary: 123 mg/dL — ABNORMAL HIGH (ref 70–99)
Glucose-Capillary: 236 mg/dL — ABNORMAL HIGH (ref 70–99)
Glucose-Capillary: 255 mg/dL — ABNORMAL HIGH (ref 70–99)
Glucose-Capillary: 322 mg/dL — ABNORMAL HIGH (ref 70–99)

## 2020-10-18 LAB — BASIC METABOLIC PANEL
Anion gap: 3 — ABNORMAL LOW (ref 5–15)
BUN: 25 mg/dL — ABNORMAL HIGH (ref 8–23)
CO2: 29 mmol/L (ref 22–32)
Calcium: 8.2 mg/dL — ABNORMAL LOW (ref 8.9–10.3)
Chloride: 106 mmol/L (ref 98–111)
Creatinine, Ser: 2.24 mg/dL — ABNORMAL HIGH (ref 0.44–1.00)
GFR, Estimated: 22 mL/min — ABNORMAL LOW (ref >60)
Glucose, Bld: 139 mg/dL — ABNORMAL HIGH (ref 70–99)
Potassium: 4.7 mmol/L (ref 3.5–5.1)
Sodium: 138 mmol/L (ref 135–145)

## 2020-10-18 LAB — CBC
HCT: 29.1 % — ABNORMAL LOW (ref 36.0–46.0)
Hemoglobin: 8.4 g/dL — ABNORMAL LOW (ref 12.0–15.0)
MCH: 27.2 pg (ref 26.0–34.0)
MCHC: 28.9 g/dL — ABNORMAL LOW (ref 30.0–36.0)
MCV: 94.2 fL (ref 80.0–100.0)
Platelets: 239 10*3/uL (ref 150–400)
RBC: 3.09 MIL/uL — ABNORMAL LOW (ref 3.87–5.11)
RDW: 16.1 % — ABNORMAL HIGH (ref 11.5–15.5)
WBC: 8.2 10*3/uL (ref 4.0–10.5)
nRBC: 0 % (ref 0.0–0.2)

## 2020-10-18 LAB — PHOSPHORUS: Phosphorus: 4.1 mg/dL (ref 2.5–4.6)

## 2020-10-18 LAB — MAGNESIUM: Magnesium: 2.7 mg/dL — ABNORMAL HIGH (ref 1.7–2.4)

## 2020-10-18 MED ORDER — IPRATROPIUM-ALBUTEROL 0.5-2.5 (3) MG/3ML IN SOLN
3.0000 mL | Freq: Four times a day (QID) | RESPIRATORY_TRACT | Status: DC
  Administered 2020-10-18 – 2020-10-20 (×7): 3 mL via RESPIRATORY_TRACT
  Filled 2020-10-18 (×6): qty 3

## 2020-10-18 MED ORDER — FOLIC ACID 1 MG PO TABS
1.0000 mg | ORAL_TABLET | Freq: Every day | ORAL | Status: DC
  Administered 2020-10-18 – 2020-10-19 (×2): 1 mg via ORAL
  Filled 2020-10-18 (×2): qty 1

## 2020-10-18 MED ORDER — SODIUM CHLORIDE 0.9 % IV SOLN
300.0000 mg | Freq: Once | INTRAVENOUS | Status: AC
  Administered 2020-10-18: 300 mg via INTRAVENOUS
  Filled 2020-10-18: qty 15

## 2020-10-18 MED ORDER — METHYLPREDNISOLONE SODIUM SUCC 40 MG IJ SOLR
40.0000 mg | Freq: Three times a day (TID) | INTRAMUSCULAR | Status: DC
  Administered 2020-10-18: 40 mg via INTRAVENOUS
  Filled 2020-10-18: qty 1

## 2020-10-18 MED ORDER — OXYCODONE-ACETAMINOPHEN 5-325 MG PO TABS
1.0000 | ORAL_TABLET | Freq: Four times a day (QID) | ORAL | Status: DC | PRN
Start: 2020-10-18 — End: 2020-10-19
  Administered 2020-10-18 – 2020-10-19 (×3): 1 via ORAL
  Filled 2020-10-18 (×3): qty 1

## 2020-10-18 MED ORDER — SODIUM CHLORIDE 0.9 % IV SOLN
500.0000 mg | INTRAVENOUS | Status: DC
  Administered 2020-10-18: 500 mg via INTRAVENOUS
  Filled 2020-10-18 (×2): qty 500

## 2020-10-18 MED ORDER — IPRATROPIUM-ALBUTEROL 0.5-2.5 (3) MG/3ML IN SOLN
3.0000 mL | Freq: Four times a day (QID) | RESPIRATORY_TRACT | Status: DC
  Filled 2020-10-18: qty 3

## 2020-10-18 MED ORDER — ISOSORBIDE MONONITRATE ER 30 MG PO TB24
30.0000 mg | ORAL_TABLET | Freq: Every day | ORAL | Status: DC
  Administered 2020-10-18: 30 mg via ORAL
  Filled 2020-10-18: qty 1

## 2020-10-18 MED ORDER — AMLODIPINE BESYLATE 5 MG PO TABS
5.0000 mg | ORAL_TABLET | Freq: Every day | ORAL | Status: DC
  Administered 2020-10-18 – 2020-10-19 (×2): 5 mg via ORAL
  Filled 2020-10-18 (×2): qty 1

## 2020-10-18 MED ORDER — POLYSACCHARIDE IRON COMPLEX 150 MG PO CAPS
150.0000 mg | ORAL_CAPSULE | Freq: Every day | ORAL | Status: DC
  Administered 2020-10-19: 150 mg via ORAL
  Filled 2020-10-18 (×2): qty 1

## 2020-10-18 MED ORDER — ASCORBIC ACID 500 MG PO TABS
500.0000 mg | ORAL_TABLET | Freq: Every day | ORAL | Status: DC
  Administered 2020-10-19: 500 mg via ORAL
  Filled 2020-10-18: qty 1

## 2020-10-18 MED ORDER — GUAIFENESIN ER 600 MG PO TB12
600.0000 mg | ORAL_TABLET | Freq: Two times a day (BID) | ORAL | Status: DC
  Administered 2020-10-18 – 2020-10-19 (×4): 600 mg via ORAL
  Filled 2020-10-18 (×4): qty 1

## 2020-10-18 MED ORDER — GUAIFENESIN 100 MG/5ML PO SOLN
5.0000 mL | ORAL | Status: DC | PRN
  Administered 2020-10-18: 100 mg via ORAL
  Filled 2020-10-18 (×2): qty 5

## 2020-10-18 MED ORDER — CYANOCOBALAMIN 1000 MCG/ML IJ SOLN
1000.0000 ug | Freq: Once | INTRAMUSCULAR | Status: AC
  Administered 2020-10-18: 1000 ug via INTRAMUSCULAR
  Filled 2020-10-18: qty 1

## 2020-10-18 NOTE — Progress Notes (Signed)
[facility] Encounter Note  Patient: Mckenzie Taylor / Admit Date: 10/16/2020 / Date of Encounter: 10/18/2020, 6:35 AM   Subjective: Patient has felt better since admission but has intermittent episodes of chest discomfort..  The patient did have some chest discomfort after procedure yesterday although currently chest pain-free on appropriate medication management Cardiac catheterization showing increased end-diastolic pressures consistent with diastolic dysfunction.  Diffuse moderate atherosclerosis with subtotal occlusion of diagonal artery and distal LAD stenoses possibly causing symptoms at this time  Discussion with interventional team agrees with medical management due to moderate atherosclerosis and no critical coronary artery disease requiring further intervention  Review of Systems: Positive for: Shortness of breath Negative for: Vision change, hearing change, syncope, dizziness, nausea, vomiting,diarrhea, bloody stool, stomach pain, cough, congestion, diaphoresis, urinary frequency, urinary pain,skin lesions, skin rashes Others previously listed  Objective: Telemetry: Normal sinus rhythm Physical Exam: Blood pressure (!) 148/92, pulse 97, temperature 97.8 F (36.6 C), resp. rate 19, height 5\' 5"  (1.651 m), weight 94.2 kg, SpO2 97 %. Body mass index is 34.56 kg/m. General: Well developed, well nourished, in no acute distress. Head: Normocephalic, atraumatic, sclera non-icteric, no xanthomas, nares are without discharge. Neck: No apparent masses Lungs: Normal respirations with no wheezes, no rhonchi, no rales , no crackles   Heart: Regular rate and rhythm, normal S1 S2, no murmur, no rub, no gallop, PMI is normal size and placement, carotid upstroke normal without bruit, jugular venous pressure normal Abdomen: Soft, non-tender, non-distended with normoactive bowel sounds. No hepatosplenomegaly. Abdominal aorta is normal size without bruit Extremities: No  edema, no clubbing, no cyanosis, no ulcers,  Peripheral: 2+ radial, 2+ femoral, 2+ dorsal pedal pulses Neuro: Alert and oriented. Moves all extremities spontaneously. Psych:  Responds to questions appropriately with a normal affect.   Intake/Output Summary (Last 24 hours) at 10/18/2020 0635 Last data filed at 10/17/2020 1722 Gross per 24 hour  Intake 240 ml  Output 300 ml  Net -60 ml     Inpatient Medications:   aspirin EC  81 mg Oral Daily   atorvastatin  40 mg Oral QHS   budesonide (PULMICORT) nebulizer solution  0.25 mg Nebulization BID   And   umeclidinium-vilanterol  1 puff Inhalation Daily   busPIRone  5 mg Oral BID   carvedilol  6.25 mg Oral BID WC   cholecalciferol  5,000 Units Oral Daily   clopidogrel  75 mg Oral Daily   insulin aspart  0-9 Units Subcutaneous TID WC   insulin detemir  43 Units Subcutaneous QHS   isosorbide-hydrALAZINE  0.5 tablet Oral TID   oxyCODONE-acetaminophen  1 tablet Oral QHS   pantoprazole  40 mg Oral Daily   sodium chloride flush  3 mL Intravenous Q12H   sodium chloride flush  3 mL Intravenous Q12H   traZODone  50 mg Oral QHS   vitamin B-12  1,500 mcg Oral Daily   Infusions:   sodium chloride     promethazine (PHENERGAN) injection (IM or IVPB) 6.25 mg (10/17/20 2137)    Labs: Recent Labs    10/17/20 0540 10/18/20 0516  NA 135 138  K 5.2* 4.7  CL 100 106  CO2 30 29  GLUCOSE 217* 139*  BUN 26* 25*  CREATININE 2.46* 2.24*  CALCIUM 8.4* 8.2*  MG  --  2.7*  PHOS  --  4.1    No results for input(s): AST, ALT, ALKPHOS, BILITOT, PROT, ALBUMIN in the last 72 hours. Recent Labs    10/17/20 0540  10/18/20 0516  WBC 8.9 8.2  HGB 9.3* 8.4*  HCT 31.4* 29.1*  MCV 92.6 94.2  PLT 315 239    No results for input(s): CKTOTAL, CKMB, TROPONINI in the last 72 hours. Invalid input(s): POCBNP No results for input(s): HGBA1C in the last 72 hours.   Weights: Filed Weights   10/16/20 1610 10/17/20 0532  Weight: 94.1 kg 94.2 kg      Radiology/Studies:  DG Chest 2 View  Result Date: 10/16/2020 CLINICAL DATA:  Chest pain. EXAM: CHEST - 2 VIEW COMPARISON:  08/17/2020. FINDINGS: Mediastinum and hilar structures normal. Low lung volumes with bibasilar atelectasis. No pleural effusion or pneumothorax. Stable cardiomegaly. No pulmonary venous congestion. No acute bony abnormality. IMPRESSION: 1.  Stable cardiomegaly.  No pulmonary venous congestion. 2.  Low lung volumes with bibasilar atelectasis. Electronically Signed   By: Marcello Moores  Register   On: 10/16/2020 09:31   CARDIAC CATHETERIZATION  Result Date: 10/17/2020 Formatting of this result is different from the original.   Mid RCA lesion is 30% stenosed.   Ost Cx to Prox Cx lesion is 30% stenosed.   Ramus lesion is 40% stenosed.   Prox LAD lesion is 55% stenosed.   Mid LAD lesion is 60% stenosed.   1st Diag lesion is 100% stenosed.   Dist LAD lesion is 80% stenosed.   Ost LM to Mid LM lesion is 40% stenosed.   LV end diastolic pressure is moderately elevated. 80 year old female with hypertension hyperlipidemia and diabetes who has had stuttering chest discomfort with 3 admissions to the hospital with non-ST elevation myocardial infarction Moderate diffuse three-vessel coronary artery disease with subtotal small diagonal stenosis possibly culprit artery for current symptoms Discussion with interventional team suggesting primarily medical management for current issues Plan High intensity cholesterol therapy Beta-blocker isosorbide calcium channel blocker for anginal symptoms Dual antiplatelet therapy Cardiac rehabilitation     Assessment and Recommendation  80 y.o. female with hypertension hyperlipidemia and non-ST elevation myocardial infarction with diastolic dysfunction heart failure having no evidence of critical coronary artery disease by cardiac catheterization.  Therefore cardiology team and interventional cardiology have discussed further treatment options including medication  management 1.  Isosorbide beta-blocker and calcium channel blocker for anginal symptoms 2.  High intensity cholesterol therapy 3.  Cardiac rehabilitation 4.  Dual antiplatelet therapy 5.  Okay for discharge home if patient is ambulating well on her current medication management with follow-up next week for further adjustments  Signed, Serafina Royals M.D. FACC

## 2020-10-18 NOTE — TOC Initial Note (Signed)
Transition of Care [facility]) - Initial/Assessment Note    Patient Details  Name: Mckenzie Taylor MRN: [id number] Date of Birth: September 30, 1940  [facility] of [facility] CM/SW Contact:    Alberteen Sam, LCSW Phone Number: 10/18/2020, 11:56 AM  Clinical Narrative:                  CSW consulted for heart failure home health screen. Patient reports she lives at home with her cousin, states she does have a 3in1 and rolling walker at home. Reports she has oxygen at home set up through [place]. Reports she would be interested in [place] and RN at discharge as she states she has issues getting around.   Reports she continues to see Dr. Posey Pronto as her PCP, has  no issues getting her medications or transportation to and from appointments.   CSW has sent referrals to home health agencies for PT and RN, pending acceptance at this time.    Expected Discharge Plan: [place] Barriers to Discharge: Continued Medical Work up   Patient Goals and CMS Choice Patient states their goals for this hospitalization and ongoing recovery are:: to go home CMS Medicare.gov Compare Post Acute Care list provided to:: Patient Choice offered to / list presented to : Patient  Expected Discharge Plan and Services Expected Discharge Plan: [place]       Living arrangements for the past 2 months: Single Family Home                                      Prior Living Arrangements/Services Living arrangements for the past 2 months: Single Family Home Lives with:: Relatives   Do you feel safe going back to the place where you live?: Yes               Activities of Daily Living Home Assistive Devices/Equipment: None ADL Screening (condition at time of admission) Patient's cognitive ability adequate to safely complete daily activities?: Yes Is the patient deaf or have difficulty hearing?: No Does the patient have difficulty seeing, even when wearing  glasses/contacts?: No Does the patient have difficulty concentrating, remembering, or making decisions?: No Patient able to express need for assistance with ADLs?: Yes Does the patient have difficulty dressing or bathing?: No Independently performs ADLs?: Yes (appropriate for developmental age) Does the patient have difficulty walking or climbing stairs?: Yes Weakness of Legs: Both Weakness of Arms/Hands: None  Permission Sought/Granted                  Emotional Assessment Appearance:: Appears stated age Attitude/Demeanor/Rapport: Gracious Affect (typically observed): Calm Orientation: : Oriented to Self, Oriented to Place, Oriented to  Time, Oriented to Situation Alcohol / Substance Use: Not Applicable Psych Involvement: No (comment)  Admission diagnosis:  Unstable angina pectoris (HCC) [I20.0] NSTEMI (non-ST elevated myocardial infarction) ([place]) [I21.4] Chest pain [R07.9] Patient Active Problem List   Diagnosis Date Noted   HFrEF (heart failure with reduced ejection fraction) ([place])    NSTEMI (non-ST elevated myocardial infarction) ([place]) 09/16/2020   HTN (hypertension) 09/16/2020   HLD (hyperlipidemia) 09/16/2020   Type II diabetes mellitus with renal manifestations ([place]) 09/16/2020   CKD (chronic kidney disease), stage IV ([place]) 09/16/2020   Depression 09/16/2020   CAD (coronary artery disease) 09/16/2020   Chronic diastolic CHF (congestive heart failure) ([place]) 09/16/2020   Hyperkalemia 09/16/2020  Positive D dimer 09/16/2020   Demand ischemia ([place]) 11/10/2018   Acute on chronic combined systolic and diastolic CHF (congestive heart failure) ([place]) 11/10/2018   Chest pain 11/08/2018   Cellulitis of right leg 02/10/2018   COPD exacerbation ([place]) 01/04/2018   CHF (congestive heart failure) ([place]) 01/06/2017   COPD (chronic obstructive pulmonary disease) ([place]) 11/25/2016   Nausea and vomiting 10/29/2016   Acute on chronic congestive heart failure ([place])    Acute  pulmonary edema ([place])    Acute respiratory failure with hypoxia ([place]) 10/25/2016   COPD with acute exacerbation ([place]) 08/14/2016   CAP (community acquired pneumonia) 78/46/9629   Acute systolic CHF (congestive heart failure) ([place]) 08/14/2016   Accelerated hypertension 08/14/2016   GERD (gastroesophageal reflux disease) 08/14/2016   Diabetes ([place]) 08/14/2016   Respiratory distress 05/05/2016   Acute respiratory failure ([place]) 03/24/2016   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

## 2020-10-18 NOTE — Progress Notes (Signed)
[facility] Progress Note  Patient: Mckenzie Taylor    LGX:[id number]  DOA: 10/16/2020     Date of Service: the patient was seen and examined on 10/18/2020  Chief Complaint  Patient presents with   Chest Pain   Brief hospital course: Mckenzie Taylor is a 80 y.o. female with medical history significant of CAD, CKD stage IV, COPD on 2-3 L of oxygen at home, chronic systolic heart failure, DM-2 who presented to the ED for evaluation of chest pain.  Per patient-she woke up around 2:30 in the AM with retrosternal chest pain.  She describes the pain as squeezing-not radiating-associated with some mild diaphoresis and nausea.  She describes the pain as 10/10-however when she first presented to the ED-she was provided supportive care-currently the pain has decreased to 3/10.  She appears comfortable.    ED Course: Found to have minimally elevated troponin-she was recently admitted to the hospital last month for similar issues-and declined a cardiac cath.  ED MD spoke with cardiology on-call-recommendations were to start IV heparin and other supportive care-cardiology will formally consult and provide further recommendations.   Assessment and Plan:  NSTEMI, presented with chest pain, pt has known CAD-recently admitted in July for similar issues-and had declined LHC.   S/p IV heparin infusion, Continue aspirin and Plavix, statin, and beta-blocker Cardiology consulted, s/p cardiac cath, Diffuse moderate atherosclerosis with subtotal occlusion of diagonal artery and distal LAD stenoses possibly causing symptoms at this time. Discussion with interventional team agrees with medical management due to moderate atherosclerosis and no critical coronary artery disease requiring further intervention.  Cardio Plan: 1.  Isosorbide beta-blocker and calcium channel blocker for anginal symptoms 2.  High intensity cholesterol therapy 3.  Cardiac rehabilitation 4.  Dual antiplatelet therapy 5.  Okay for  discharge home if patient is ambulating well on her current medication management with follow-up next week for further adjustments    COPD with chronic hypoxemic respiratory failure on 2-3 L of oxygen at home:  8/6 COPD exacerbation noticed today morning Started Solu-Medrol IV 40 every 8 hourly, azithromycin 500 daily for prophylaxis,  Continued Pulmicort nebs and Anoro Ellipta inhaler DuoNeb every 6 hourly scheduled, transition to as needed after improvement    Insulin-dependent DM-2: Resume Lantus-start SSI-follow CBG trend and adjust accordingly.   HTN: BP stable-resume Imdur/beta-blocker-follow and adjust   Chronic systolic heart failure: Euvolemic on exam-diuretics held as patient does not seem volume overload.  Patient received contrast due to cardiac cath, monitor renal functions   CKD stage IV: Creatinine close to baseline-watch closely if patient does not require LHC-at risk for kidney injury. Mildly elevated potassium, Lokelma x2 doses ordered.  Potassium 4.7, improved 8/6 Creatinine 2.24, continue to monitor   Anemia of chronic disease, Hb 9.3 on 8/5 Iron deficiency, Venofer 300 mg IV x1 dose given, started oral supplement. Vitamin B12 level 219, target >400, vitamin B12 1000 mcg IM injection given, started oral supplement.  Folate within normal range Monitor H&H,   Vitamin D insufficiency, started vitamin D supplement.  GERD: Continue PPI   Body mass index is 34.56 kg/m.  Interventions:       Diet: Heart healthy/carb modified, n.p.o. for cardiac cath DVT Prophylaxis: Heparin IV infusion  Advance goals of care discussion: Full code  Family Communication: family was NOT present at bedside, at the time of interview.  The pt provided permission to discuss medical plan with the family. Opportunity was given to ask question and all questions were answered satisfactorily.  Disposition:  Pt is from home, admitted with chest pain, patient is s/p cardiac cath.  Today  patient developed shortness of breath and COPD exacerbation so we will manage diet, which precludes a safe discharge. Discharge to home, when respiratory symptoms improve, patient has been cleared by cardiology.    Subjective: No overnight events, in the morning time patient was sleepy and patient was having shortness of breath, seems to be having COPD exacerbation so we will monitor today and discharge tomorrow.  Patient denied any chest pain or palpitations.  Physical Exam: General:  alert oriented to time, place, and person.  Appear in mild distress, affect appropriate Eyes: PERRLA ENT: Oral Mucosa Clear, moist  Neck: no JVD,  Cardiovascular: S1 and S2 Present, audible murmur,  Respiratory: good respiratory effort, Bilateral Air entry equal and Decreased, no Crackles, no wheezes Abdomen: Bowel Sound present, Soft and no tenderness,  Skin: no rashes Extremities: no Pedal edema, no calf tenderness Neurologic: without any new focal findings Gait not checked due to patient safety concerns  Vitals:   10/18/20 0438 10/18/20 0500 10/18/20 0743 10/18/20 1126  BP: (!) 148/92  (!) 154/76 123/60  Pulse: 97  92 92  Resp: 19  16 17   Temp: 97.8 F (36.6 C)   98.2 F (36.8 C)  TempSrc:      SpO2: 97%  99% 99%  Weight:  97.6 kg    Height:        Intake/Output Summary (Last 24 hours) at 10/18/2020 1458 Last data filed at 10/17/2020 1722 Gross per 24 hour  Intake 240 ml  Output 300 ml  Net -60 ml   Filed Weights   10/16/20 0833 10/17/20 0532 10/18/20 0500  Weight: 94.1 kg 94.2 kg 97.6 kg    Data Reviewed: I have personally reviewed and interpreted daily labs, tele strips, imagings as discussed above. I reviewed all nursing notes, pharmacy notes, vitals, pertinent old records I have discussed plan of care as described above with RN and patient/family.  CBC: Recent Labs  Lab 10/16/20 0840 10/17/20 0540 10/18/20 0516  WBC 9.5 8.9 8.2  HGB 9.9* 9.3* 8.4*  HCT 33.6* 31.4* 29.1*   MCV 90.6 92.6 94.2  PLT 326 315 664   Basic Metabolic Panel: Recent Labs  Lab 10/16/20 0840 10/17/20 0540 10/18/20 0516  NA 139 135 138  K 4.3 5.2* 4.7  CL 97* 100 106  CO2 31 30 29   GLUCOSE 320* 217* 139*  BUN 22 26* 25*  CREATININE 2.48* 2.46* 2.24*  CALCIUM 8.9 8.4* 8.2*  MG  --   --  2.7*  PHOS  --   --  4.1    Studies: CARDIAC CATHETERIZATION  Result Date: 10/17/2020 Formatting of this result is different from the original.   Mid RCA lesion is 30% stenosed.   Ost Cx to Prox Cx lesion is 30% stenosed.   Ramus lesion is 40% stenosed.   Prox LAD lesion is 55% stenosed.   Mid LAD lesion is 60% stenosed.   1st Diag lesion is 100% stenosed.   Dist LAD lesion is 80% stenosed.   Ost LM to Mid LM lesion is 40% stenosed.   LV end diastolic pressure is moderately elevated. 80 year old female with hypertension hyperlipidemia and diabetes who has had stuttering chest discomfort with 3 admissions to the hospital with non-ST elevation myocardial infarction Moderate diffuse three-vessel coronary artery disease with subtotal small diagonal stenosis possibly culprit artery for current symptoms Discussion with interventional team suggesting primarily medical management  for current issues Plan High intensity cholesterol therapy Beta-blocker isosorbide calcium channel blocker for anginal symptoms Dual antiplatelet therapy Cardiac rehabilitation    Scheduled Meds:  amLODipine  5 mg Oral Daily   [START ON 10/19/2020] vitamin C  500 mg Oral Daily   aspirin EC  81 mg Oral Daily   atorvastatin  40 mg Oral QHS   budesonide (PULMICORT) nebulizer solution  0.25 mg Nebulization BID   And   umeclidinium-vilanterol  1 puff Inhalation Daily   busPIRone  5 mg Oral BID   carvedilol  6.25 mg Oral BID WC   cholecalciferol  5,000 Units Oral Daily   clopidogrel  75 mg Oral Daily   folic acid  1 mg Oral Daily   guaiFENesin  600 mg Oral BID   insulin aspart  0-9 Units Subcutaneous TID WC   insulin detemir  43  Units Subcutaneous QHS   ipratropium-albuterol  3 mL Nebulization Q6H   [START ON 10/19/2020] iron polysaccharides  150 mg Oral Daily   isosorbide mononitrate  30 mg Oral Daily   methylPREDNISolone (SOLU-MEDROL) injection  40 mg Intravenous Q8H   oxyCODONE-acetaminophen  1 tablet Oral QHS   pantoprazole  40 mg Oral Daily   sodium chloride flush  3 mL Intravenous Q12H   sodium chloride flush  3 mL Intravenous Q12H   traZODone  50 mg Oral QHS   vitamin B-12  1,500 mcg Oral Daily   Continuous Infusions:  sodium chloride     azithromycin 500 mg (10/18/20 1155)   iron sucrose 300 mg (10/18/20 1334)   promethazine (PHENERGAN) injection (IM or IVPB) Stopped (10/18/20 0916)   PRN Meds: sodium chloride, acetaminophen **OR** acetaminophen, acetaminophen, albuterol, guaiFENesin, morphine injection, nitroGLYCERIN, ondansetron **OR** ondansetron (ZOFRAN) IV, ondansetron (ZOFRAN) IV, promethazine (PHENERGAN) injection (IM or IVPB), sodium chloride flush, tiZANidine  Time spent: 35 minutes  Author: Val Riles. MD [facility] 10/18/2020 2:58 PM  To reach On-call, see care teams to locate the attending and reach out to them via [email or web address]. If 7PM-7AM, please contact night-coverage If you still have difficulty reaching the attending provider, please page the [facility] (Director on Call) for [facility] on amion for assistance.

## 2020-10-19 DIAGNOSIS — I5020 Unspecified systolic (congestive) heart failure: Secondary | ICD-10-CM

## 2020-10-19 DIAGNOSIS — E875 Hyperkalemia: Secondary | ICD-10-CM

## 2020-10-19 DIAGNOSIS — N1832 Chronic kidney disease, stage 3b: Secondary | ICD-10-CM

## 2020-10-19 DIAGNOSIS — J441 Chronic obstructive pulmonary disease with (acute) exacerbation: Secondary | ICD-10-CM

## 2020-10-19 DIAGNOSIS — E1122 Type 2 diabetes mellitus with diabetic chronic kidney disease: Secondary | ICD-10-CM

## 2020-10-19 LAB — CBC
HCT: 29 % — ABNORMAL LOW (ref 36.0–46.0)
Hemoglobin: 9 g/dL — ABNORMAL LOW (ref 12.0–15.0)
MCH: 28.4 pg (ref 26.0–34.0)
MCHC: 31 g/dL (ref 30.0–36.0)
MCV: 91.5 fL (ref 80.0–100.0)
Platelets: 289 10*3/uL (ref 150–400)
RBC: 3.17 MIL/uL — ABNORMAL LOW (ref 3.87–5.11)
RDW: 15.9 % — ABNORMAL HIGH (ref 11.5–15.5)
WBC: 10.5 10*3/uL (ref 4.0–10.5)
nRBC: 0 % (ref 0.0–0.2)

## 2020-10-19 LAB — BASIC METABOLIC PANEL
Anion gap: 6 (ref 5–15)
BUN: 23 mg/dL (ref 8–23)
CO2: 29 mmol/L (ref 22–32)
Calcium: 8.8 mg/dL — ABNORMAL LOW (ref 8.9–10.3)
Chloride: 103 mmol/L (ref 98–111)
Creatinine, Ser: 2.18 mg/dL — ABNORMAL HIGH (ref 0.44–1.00)
GFR, Estimated: 23 mL/min — ABNORMAL LOW (ref >60)
Glucose, Bld: 171 mg/dL — ABNORMAL HIGH (ref 70–99)
Potassium: 5.3 mmol/L — ABNORMAL HIGH (ref 3.5–5.1)
Sodium: 138 mmol/L (ref 135–145)

## 2020-10-19 LAB — GLUCOSE, CAPILLARY
Glucose-Capillary: 171 mg/dL — ABNORMAL HIGH (ref 70–99)
Glucose-Capillary: 211 mg/dL — ABNORMAL HIGH (ref 70–99)
Glucose-Capillary: 173 mg/dL — ABNORMAL HIGH (ref 70–99)
Glucose-Capillary: 179 mg/dL — ABNORMAL HIGH (ref 70–99)

## 2020-10-19 MED ORDER — FUROSEMIDE 20 MG PO TABS
20.0000 mg | ORAL_TABLET | Freq: Once | ORAL | Status: AC
  Administered 2020-10-19: 20 mg via ORAL
  Filled 2020-10-19: qty 1

## 2020-10-19 MED ORDER — PREDNISONE 50 MG PO TABS
50.0000 mg | ORAL_TABLET | Freq: Every day | ORAL | Status: DC
  Administered 2020-10-20: 50 mg via ORAL
  Filled 2020-10-19: qty 1

## 2020-10-19 MED ORDER — ISOSORBIDE MONONITRATE ER 60 MG PO TB24
60.0000 mg | ORAL_TABLET | Freq: Every day | ORAL | Status: DC
  Administered 2020-10-19: 60 mg via ORAL
  Filled 2020-10-19: qty 1

## 2020-10-19 MED ORDER — AZITHROMYCIN 250 MG PO TABS: 250.0000 mg | ORAL_TABLET | Freq: Every day | ORAL | Status: DC

## 2020-10-19 MED ORDER — ATORVASTATIN CALCIUM 80 MG PO TABS
80.0000 mg | ORAL_TABLET | Freq: Every day | ORAL | Status: DC
  Administered 2020-10-19: 80 mg via ORAL
  Filled 2020-10-19: qty 1

## 2020-10-19 MED ORDER — AZITHROMYCIN 250 MG PO TABS
500.0000 mg | ORAL_TABLET | Freq: Every day | ORAL | Status: AC
  Administered 2020-10-19: 500 mg via ORAL
  Filled 2020-10-19: qty 2

## 2020-10-19 NOTE — Progress Notes (Signed)
[facility] Encounter Note  Patient: Mckenzie Taylor / Admit Date: 10/16/2020 / Date of Encounter: 10/19/2020, 8:01 AM   Subjective: Patient has felt better since admission but has intermittent episodes of chest discomfort..  The patient did have some chest discomfort yesterday although currently chest pain-free on appropriate medication management Cardiac catheterization showing increased end-diastolic pressures consistent with diastolic dysfunction.  Diffuse moderate atherosclerosis with subtotal occlusion of diagonal artery and distal LAD stenoses possibly causing symptoms at this time  Discussion with interventional team agrees with medical management due to moderate atherosclerosis and no critical coronary artery disease requiring further intervention  Review of Systems: Positive for: Shortness of breath cp Negative for: Vision change, hearing change, syncope, dizziness, nausea, vomiting,diarrhea, bloody stool, stomach pain, cough, congestion, diaphoresis, urinary frequency, urinary pain,skin lesions, skin rashes Others previously listed  Objective: Telemetry: Normal sinus rhythm Physical Exam: Blood pressure (!) 154/81, pulse 95, temperature 97.6 F (36.4 C), resp. rate 17, height 5\' 5"  (1.651 m), weight 97.6 kg, SpO2 94 %. Body mass index is 35.81 kg/m. General: Well developed, well nourished, in no acute distress. Head: Normocephalic, atraumatic, sclera non-icteric, no xanthomas, nares are without discharge. Neck: No apparent masses Lungs: Normal respirations with no wheezes, no rhonchi, no rales , no crackles   Heart: Regular rate and rhythm, normal S1 S2, no murmur, no rub, no gallop, PMI is normal size and placement, carotid upstroke normal without bruit, jugular venous pressure normal Abdomen: Soft, non-tender, non-distended with normoactive bowel sounds. No hepatosplenomegaly. Abdominal aorta is normal size without bruit Extremities: No edema, no  clubbing, no cyanosis, no ulcers,  Peripheral: 2+ radial, 2+ femoral, 2+ dorsal pedal pulses Neuro: Alert and oriented. Moves all extremities spontaneously. Psych:  Responds to questions appropriately with a normal affect.   Intake/Output Summary (Last 24 hours) at 10/19/2020 0801 Last data filed at 10/19/2020 0500 Gross per 24 hour  Intake 300 ml  Output --  Net 300 ml     Inpatient Medications:   amLODipine  5 mg Oral Daily   vitamin C  500 mg Oral Daily   aspirin EC  81 mg Oral Daily   atorvastatin  80 mg Oral Daily   budesonide (PULMICORT) nebulizer solution  0.25 mg Nebulization BID   And   umeclidinium-vilanterol  1 puff Inhalation Daily   busPIRone  5 mg Oral BID   carvedilol  6.25 mg Oral BID WC   cholecalciferol  5,000 Units Oral Daily   clopidogrel  75 mg Oral Daily   folic acid  1 mg Oral Daily   guaiFENesin  600 mg Oral BID   insulin aspart  0-9 Units Subcutaneous TID WC   insulin detemir  43 Units Subcutaneous QHS   ipratropium-albuterol  3 mL Nebulization Q6H   iron polysaccharides  150 mg Oral Daily   isosorbide mononitrate  60 mg Oral Daily   methylPREDNISolone (SOLU-MEDROL) injection  40 mg Intravenous Q8H   pantoprazole  40 mg Oral Daily   sodium chloride flush  3 mL Intravenous Q12H   sodium chloride flush  3 mL Intravenous Q12H   traZODone  50 mg Oral QHS   vitamin B-12  1,500 mcg Oral Daily   Infusions:   sodium chloride     azithromycin 500 mg (10/18/20 1155)   promethazine (PHENERGAN) injection (IM or IVPB) Stopped (10/18/20 0916)    Labs: Recent Labs    10/18/20 0516 10/19/20 0548  NA 138 138  K 4.7 5.3*  CL 106  103  CO2 29 29  GLUCOSE 139* 171*  BUN 25* 23  CREATININE 2.24* 2.18*  CALCIUM 8.2* 8.8*  MG 2.7*  --   PHOS 4.1  --     No results for input(s): AST, ALT, ALKPHOS, BILITOT, PROT, ALBUMIN in the last 72 hours. Recent Labs    10/18/20 0516 10/19/20 0548  WBC 8.2 10.5  HGB 8.4* 9.0*  HCT 29.1* 29.0*  MCV 94.2 91.5  PLT  239 289    No results for input(s): CKTOTAL, CKMB, TROPONINI in the last 72 hours. Invalid input(s): POCBNP No results for input(s): HGBA1C in the last 72 hours.   Weights: Filed Weights   10/16/20 0833 10/17/20 0532 10/18/20 0500  Weight: 94.1 kg 94.2 kg 97.6 kg     Radiology/Studies:  DG Chest 2 View  Result Date: 10/16/2020 CLINICAL DATA:  Chest pain. EXAM: CHEST - 2 VIEW COMPARISON:  08/17/2020. FINDINGS: Mediastinum and hilar structures normal. Low lung volumes with bibasilar atelectasis. No pleural effusion or pneumothorax. Stable cardiomegaly. No pulmonary venous congestion. No acute bony abnormality. IMPRESSION: 1.  Stable cardiomegaly.  No pulmonary venous congestion. 2.  Low lung volumes with bibasilar atelectasis. Electronically Signed   By: Marcello Moores  Register   On: 10/16/2020 09:31   CARDIAC CATHETERIZATION  Result Date: 10/17/2020 Formatting of this result is different from the original.   Mid RCA lesion is 30% stenosed.   Ost Cx to Prox Cx lesion is 30% stenosed.   Ramus lesion is 40% stenosed.   Prox LAD lesion is 55% stenosed.   Mid LAD lesion is 60% stenosed.   1st Diag lesion is 100% stenosed.   Dist LAD lesion is 80% stenosed.   Ost LM to Mid LM lesion is 40% stenosed.   LV end diastolic pressure is moderately elevated. 80 year old female with hypertension hyperlipidemia and diabetes who has had stuttering chest discomfort with 3 admissions to the hospital with non-ST elevation myocardial infarction Moderate diffuse three-vessel coronary artery disease with subtotal small diagonal stenosis possibly culprit artery for current symptoms Discussion with interventional team suggesting primarily medical management for current issues Plan High intensity cholesterol therapy Beta-blocker isosorbide calcium channel blocker for anginal symptoms Dual antiplatelet therapy Cardiac rehabilitation     Assessment and Recommendation  80 y.o. female with hypertension hyperlipidemia and non-ST  elevation myocardial infarction with diastolic dysfunction heart failure having no evidence of critical coronary artery disease by cardiac catheterization.  Therefore cardiology team and interventional cardiology have discussed further treatment options including medication management 1.  Isosorbide beta-blocker and calcium channel blocker for anginal symptoms and slight adjustments of medication management today 2.  High intensity cholesterol therapy with slight adjustments today 3.  Cardiac rehabilitation 4.  Dual antiplatelet therapy 5.  Okay for discharge home if patient is ambulating well on her current medication management with follow-up next week for further adjustments  Signed, Serafina Royals M.D. FACC

## 2020-10-19 NOTE — Evaluation (Addendum)
Physical Therapy Evaluation Patient Details Name: Mckenzie Taylor MRN: [id number] DOB: May 20, 1940 Today's Date: 10/19/2020   History of Present Illness  Pt is a 80 y/o F who presented to the ED for evaluation of chest pain. Pt is s/p cardiac cath on 10/17/20 with plans for medical management due to moderate atherosclerosis and no critical coronary artery disease requiring further intervention. Pt then found to have COPD exacerbation on 10/18/20. PMH: CAD, CKD stage IV, COPD on 2-3L O2 at home, chronic systolic heart failure, DM2  Clinical Impression  MD cleared pt for participation in PT in setting of slightly elevated K+. Pt seen for PT evaluation with pt received sitting on EOB & reporting need to void. Pt completes stand pivot transfer to [facility] with mod I without AD & toilets without assistance. Pt stands & PT adjusts lines when pt reports feeling lightheaded so pt returned to sitting. BP 126/77 mmHg in LUE (MAP 90), SpO2 >90% on room air. Once pt feeling better pt is able to ambulate short distance in the room without AD with guarded gait & cuing not to hold to furniture for support; pt does note fatigue & requires rest break after gait. Discussed recommendation of using RW & HHPT f/u upon d/c but pt would benefit from further education. Will continue to follow pt acutely to progress gait with LRAD, for endurance & balance training.   No bleeding noted to R groin after gait. Pt c/o nausea at end of session - nurse notified.    Follow Up Recommendations Home health PT;Supervision - Intermittent    Equipment Recommendations  None recommended by PT (pt reports she has all DME needs)    Recommendations for Other Services       Precautions / Restrictions Precautions Precautions: Fall Restrictions Weight Bearing Restrictions: No      Mobility  Bed Mobility               General bed mobility comments: not observed, pt received & left sitting on EOB    Transfers Overall transfer  level: Modified independent Equipment used: None             General transfer comment: sit<>stand & stand pivot to [facility]  Ambulation/Gait Ambulation/Gait assistance: Min guard Gait Distance (Feet): 10 Feet Assistive device: None Gait Pattern/deviations: Decreased step length - right;Decreased step length - left;Decreased stride length;Shuffle Gait velocity: significantly decreased   General Gait Details: cuing to not hold to Forensic scientist    Modified Rankin (Stroke Patients Only)       Balance Overall balance assessment: Needs assistance Sitting-balance support: Feet supported Sitting balance-Leahy Scale: Normal     Standing balance support: No upper extremity supported Standing balance-Leahy Scale: Fair                               Pertinent Vitals/Pain Pain Assessment:  (grimaced when PT inspected groin site at end of session with no other c/o pain during session.)    Home Living Family/patient expects to be discharged to:: Private residence Living Arrangements: Other relatives (cousin) Available Help at Discharge: Family;Available 24 hours/day Type of Home: House Home Access: Level entry (at backdoor)     Home Layout: One level Home Equipment: Walker - 2 wheels;Wheelchair - [employer] - single point (rollator)      Prior Function  Comments: Denies falls. Ambulates without AD in home but notes she can furniture walk when needed. Doesn't drive.     Hand Dominance        Extremity/Trunk Assessment   Upper Extremity Assessment Upper Extremity Assessment: Overall WFL for tasks assessed    Lower Extremity Assessment Lower Extremity Assessment: Generalized weakness       Communication   Communication: No difficulties  Cognition Arousal/Alertness: Awake/alert Behavior During Therapy: WFL for tasks assessed/performed Overall Cognitive Status: Within Functional Limits for tasks  assessed                                        General Comments General comments (skin integrity, edema, etc.): Pt on 3L/min via nasal cannula during session.    Exercises     Assessment/Plan    PT Assessment Patient needs continued PT services  PT Problem List Decreased strength;Decreased mobility;Decreased safety awareness;Decreased balance;Cardiopulmonary status limiting activity;Decreased activity tolerance       PT Treatment Interventions Therapeutic activities;DME instruction;Gait training;Therapeutic exercise;Patient/family education;Balance training;Functional mobility training;Neuromuscular re-education;Manual techniques    PT Goals (Current goals can be found in the Care Plan section)  Acute Rehab PT Goals Patient Stated Goal: go home PT Goal Formulation: With patient Time For Goal Achievement: 11/02/20 Potential to Achieve Goals: Good    Frequency Min 2X/week   Barriers to discharge        Co-evaluation               AM-PAC PT "6 Clicks" Mobility  Outcome Measure Help needed turning from your back to your side while in a flat bed without using bedrails?: None Help needed moving from lying on your back to sitting on the side of a flat bed without using bedrails?: None Help needed moving to and from a bed to a chair (including a wheelchair)?: None Help needed standing up from a chair using your arms (e.g., wheelchair or bedside chair)?: None Help needed to walk in hospital room?: A Little Help needed climbing 3-5 steps with a railing? : A Little 6 Click Score: 22    End of Session Equipment Utilized During Treatment: Oxygen Activity Tolerance: Patient tolerated treatment well;Patient limited by fatigue Patient left: in bed;with call bell/phone within reach (sitting EOB) Nurse Communication: Mobility status PT Visit Diagnosis: Unsteadiness on feet (R26.81);Muscle weakness (generalized) (M62.81);Difficulty in walking, not elsewhere  classified (R26.2)    Time: 0321-2248 PT Time Calculation (min) (ACUTE ONLY): 25 min   Charges:   PT Evaluation $PT Eval Moderate Complexity: 1 Mod PT Treatments $Therapeutic Activity: 8-22 mins        Lavone Nian, PT, DPT 10/19/20, 12:34 PM   Waunita Schooner 10/19/2020, 12:29 PM

## 2020-10-19 NOTE — Progress Notes (Signed)
Ambulated pt on 3LO2 and her O2 Sat rate went down to 92%.  Pt at rest on 3LO2 was at 96%.

## 2020-10-19 NOTE — Evaluation (Addendum)
Occupational Therapy Evaluation Patient Details Name: Mckenzie Taylor MRN: [id number] DOB: 19-Oct-1940 Today's Date: 10/19/2020    History of Present Illness Pt is a 80 y/o F who presented to the ED for evaluation of chest pain. Pt is s/p cardiac cath on 10/17/20 with plans for medical management due to moderate atherosclerosis and no critical coronary artery disease requiring further intervention. Pt then found to have COPD exacerbation on 10/18/20. PMH: CAD, CKD stage IV, COPD on 2-3L O2 at home, chronic systolic heart failure, DM2   Clinical Impression   Pt seen for OT evaluation this date (MD cleared pt for OT in setting of elevated K+: 5.3). Upon arrival to room, pt sitting EOB with grand daughter at bedside. Prior to admission, pt reports that she was mod-independent with ADLs, however required SUPERVISION from family for shower transfers. At baseline, pt performs functional mobility of short household distances without AD, but endorses that she fatigues quickly during mobility of short household distances (pt reported that she was able to walk from bedroom to bathroom/front porch, but is typically fatigued following functional mobility). Pt on 2-3L of O2 at home.  This date, pt was only able to tolerate x1 sit<>stand transfer and x2 standing grooming tasks d/t decreased activity tolerance. Pt declined further functional mobility, ADLs, and therapy exercises d/t feeling fatigued and "like my heart is racing"; HR 110s and SpO2 >92% following activity. Pt educated in energy conservation strategies, including pacing and seated rest breaks, with pt verbalizing understanding. Pt encouraged to independently perform seated UE/LE therapy exercises while admitted to maintain strength, with pt verbalizing understanding. Pt would benefit from additional skilled OT services to maximize return to PLOF and prevent further deconditioning. Upon discharge, OT to recommend [facility] services (however pt currently declining  HHOT and stating "once I am home, I will be okay").     Follow Up Recommendations  Home health OT;Supervision - Intermittent    Equipment Recommendations  3 in 1 bedside commode       Precautions / Restrictions Precautions Precautions: Fall Restrictions Weight Bearing Restrictions: No      Mobility Bed Mobility               General bed mobility comments: not observed, pt received & left sitting on EOB    Transfers Overall transfer level: Modified independent Equipment used: None             General transfer comment: sit<>stand    Balance Overall balance assessment: Needs assistance Sitting-balance support: Feet supported;No upper extremity supported Sitting balance-Leahy Scale: Normal     Standing balance support: No upper extremity supported;During functional activity Standing balance-Leahy Scale: Fair Standing balance comment: Able to maintain balance without physical assist, but requires SUPERVISION d/t fatiguing quickly with minimal activity                           ADL either performed or assessed with clinical judgement   ADL Overall ADL's : Needs assistance/impaired     Grooming: Wash/dry hands;Brushing hair;Supervision/safety;Set up;Standing                               Functional mobility during ADLs: Supervision/safety        Pertinent Vitals/Pain Pain Assessment: No/denies pain        Extremity/Trunk Assessment Upper Extremity Assessment Upper Extremity Assessment: Overall WFL for tasks assessed   Lower Extremity Assessment Lower  Extremity Assessment: Generalized weakness       Communication Communication Communication: No difficulties   Cognition Arousal/Alertness: Awake/alert Behavior During Therapy: WFL for tasks assessed/performed Overall Cognitive Status: Within Functional Limits for tasks assessed                                 General Comments: Decreased awareness of  deficits   General Comments  During activity, HR 110s and SpO2 >92% while on 3L of O2 via [place]. At rest, HR 90s    Exercises Other Exercises Other Exercises: Education on energy conservation strategies including seated rest breaks and pacing. Other Exercises: Education on seated UE/LE exercises to perform while seated EOB to maintain strength        Home Living Family/patient expects to be discharged to:: Private residence Living Arrangements: Other relatives (cousin) Available Help at Discharge: Family;Available 24 hours/day (cousin, son, and grandchildren available help at discharge) Type of Home: House Home Access: Level entry (at backdoor)     Home Layout: One level     Bathroom Shower/Tub: Teacher, early years/pre: Standard (BSC frame over toilet)     Home Equipment: Walker - 2 wheels;Wheelchair - manual;Cane - single point;Shower seat - built in;Other (comment);Hand held shower head (rollator, BSC above toilet)          Prior Functioning/Environment Level of Independence: Independent with assistive device(s)        Comments: Pt reports that she is mod-independent with ADLs, however requires SUPERVISION for shower transfers. At baseline, pt performs functional mobility of short household distances without AD, but endorses that she fatigues quickly during mobility of short household distances. Pt denies falls. Pt can prepare simple meals, but family assists with other IADLs        OT Problem List: Decreased activity tolerance;Cardiopulmonary status limiting activity      OT Treatment/Interventions: Self-care/ADL training;Therapeutic exercise;Energy conservation;Therapeutic activities;Visual/perceptual remediation/compensation;Patient/family education;Balance training    OT Goals(Current goals can be found in the care plan section) Acute Rehab OT Goals Patient Stated Goal: go home tomorrow OT Goal Formulation: With patient Time For Goal Achievement:  11/02/20 Potential to Achieve Goals: Fair ADL Goals Pt Will Perform Grooming: with supervision;standing (will be able to complete x3 grooming & hygiene tasks) Pt Will Transfer to Toilet: with supervision;ambulating;bedside commode (with BSC placed 10 ft from bed) Pt Will Perform Toileting - Clothing Manipulation and hygiene: with supervision;sit to/from stand  OT Frequency: Min 1X/week    AM-PAC OT "6 Clicks" Daily Activity     Outcome Measure Help from another person eating meals?: None Help from another person taking care of personal grooming?: A Little Help from another person toileting, which includes using toliet, bedpan, or urinal?: A Little Help from another person bathing (including washing, rinsing, drying)?: A Little Help from another person to put on and taking off regular upper body clothing?: A Little Help from another person to put on and taking off regular lower body clothing?: A Little 6 Click Score: 19   End of Session Equipment Utilized During Treatment: Oxygen Nurse Communication: Mobility status  Activity Tolerance: Patient tolerated treatment well Patient left: in bed;with call bell/phone within reach;with family/visitor present  OT Visit Diagnosis: Muscle weakness (generalized) (M62.81);Unsteadiness on feet (R26.81)                Time: 6546-5035 OT Time Calculation (min): 17 min Charges:  OT General Charges $OT Visit: 1 Visit OT  Treatments $Self Care/Home Management : 8-22 mins  Fredirick Maudlin, OTR/L [place]

## 2020-10-19 NOTE — Progress Notes (Signed)
[facility] Progress Note  Patient: Mckenzie Taylor    IRJ:[id number]  DOA: 10/16/2020     Date of Service: the patient was seen and examined on 10/19/2020  Chief Complaint  Patient presents with   Chest Pain   Brief hospital course: Mckenzie Taylor is a 80 y.o. female with medical history significant of CAD, CKD stage IV, COPD on 2-3 L of oxygen at home, chronic systolic heart failure, DM-2 who presented to the ED for evaluation of chest pain.  Per patient-she woke up around 2:30 in the AM with retrosternal chest pain.  She describes the pain as squeezing-not radiating-associated with some mild diaphoresis and nausea.  She describes the pain as 10/10-however when she first presented to the ED-she was provided supportive care-currently the pain has decreased to 3/10.  She appears comfortable.    ED Course: Found to have minimally elevated troponin-she was recently admitted to the hospital last month for similar issues-and declined a cardiac cath.  ED MD spoke with cardiology on-call-recommendations were to start IV heparin and other supportive care-cardiology will formally consult and provide further recommendations.   Assessment and Plan:  NSTEMI, presented with chest pain, pt has known CAD-recently admitted in July for similar issues-and had declined LHC.   S/p IV heparin infusion, Continue aspirin and Plavix, statin, and beta-blocker Cardiology consulted, s/p cardiac cath, Diffuse moderate atherosclerosis with subtotal occlusion of diagonal artery and distal LAD stenoses possibly causing symptoms at this time. Discussion with interventional team agrees with medical management due to moderate atherosclerosis and no critical coronary artery disease requiring further intervention.  - continue Isosorbide beta-blocker and calcium channel blocker for anginal symptoms, High intensity statin,  Cardiac rehabilitation, Dual antiplatelet therapy - will request ambulatory pulse Ox  COPD with  chronic hypoxemic respiratory failure on 2-3 L of oxygen at home:   COPD exacerbation slowly improving but still looks dyspneic and is tachypneic also. Switch IV Solumedrol to PO Prednisone and IV azithromycin to oral 500 mg daily as patient lost IV Continued Pulmicort nebs and Anoro Ellipta inhaler DuoNeb every 6 hourly scheduled, transition to as needed after improvement   Insulin-dependent DM-2: continue levemir and SSI-follow CBG trend and adjust accordingly.   HTN: BP stable-resume Imdur/beta-blocker-follow and adjust   Chronic systolic heart failure: Euvolemic on exam-diuretics held as patient does not seem volume overload.  Patient received contrast due to cardiac cath, monitor renal functions   CKD stage IV: Creatinine close to baseline-watch closely if patient does not require LHC-at risk for kidney injury. Mildly elevated potassium, s/p Lokelma x2 doses and was Potassium 4.7 but again up to 5.3 Creatinine 2.24->2.18, continue to monitor. Will give 20 mg of Lasix once today and recheck BMP tomorrow  Anemia of chronic disease, Hb 9.3 on 8/5 Iron deficiency, Venofer 300 mg IV x1 dose given, started oral supplement. Vitamin B12 level 219, target >400, vitamin B12 1000 mcg IM injection given, started oral supplement.  Folate within normal range Monitor H&H   Vitamin D insufficiency - continue vitamin D supplement.  GERD: Continue PPI  Obesity Body mass index is 34.56 kg/m.       Diet: Heart healthy/carb modified DVT Prophylaxis: none. Patient is ambulating   Advance goals of care discussion: Full code  Family Communication: none today  Disposition:  Pt is from home, admitted with chest pain, patient is s/p cardiac cath.  Today her K is 5.3 with some SOB which precludes discharge to home, likely D/C in am  Subjective:  having some shortness of breath, sitting in bed and looks dyspneic. K 5.3  Physical Exam: General:  alert oriented to time, place, and person.  Appear  in mild distress, affect appropriate Eyes: PERRLA ENT: Oral Mucosa Clear, moist  Neck: no JVD,  Cardiovascular: S1 and S2 Present, audible murmur,  Respiratory: good respiratory effort, Bilateral Air entry equal and Decreased, no Crackles, no wheezes Abdomen: Bowel Sound present, Soft and no tenderness,  Skin: no rashes Extremities: no Pedal edema, no calf tenderness Neurologic: without any new focal findings Gait not checked   Vitals:   10/19/20 0410 10/19/20 0746 10/19/20 1146 10/19/20 1531  BP: (!) 146/95 (!) 154/81 131/75 (!) 115/56  Pulse: 85 95 87 97  Resp: 18 17 16  (!) 25  Temp: 98.8 F (37.1 C) 97.6 F (36.4 C) 97.9 F (36.6 C) (!) 97.5 F (36.4 C)  TempSrc:      SpO2: 98% 94% 98% 97%  Weight:      Height:        Intake/Output Summary (Last 24 hours) at 10/19/2020 1612 Last data filed at 10/19/2020 0500 Gross per 24 hour  Intake 300 ml  Output --  Net 300 ml   Filed Weights   10/16/20 0833 10/17/20 0532 10/18/20 0500  Weight: 94.1 kg 94.2 kg 97.6 kg    Data Reviewed: I have personally reviewed and interpreted daily labs, tele strips, imagings as discussed above. I reviewed all nursing notes, pharmacy notes, vitals, pertinent old records I have discussed plan of care as described above with RN and patient/family.  CBC: Recent Labs  Lab 10/16/20 0840 10/17/20 0540 10/18/20 0516 10/19/20 0548  WBC 9.5 8.9 8.2 10.5  HGB 9.9* 9.3* 8.4* 9.0*  HCT 33.6* 31.4* 29.1* 29.0*  MCV 90.6 92.6 94.2 91.5  PLT 326 315 239 681   Basic Metabolic Panel: Recent Labs  Lab 10/16/20 0840 10/17/20 0540 10/18/20 0516 10/19/20 0548  NA 139 135 138 138  K 4.3 5.2* 4.7 5.3*  CL 97* 100 106 103  CO2 31 30 29 29   GLUCOSE 320* 217* 139* 171*  BUN 22 26* 25* 23  CREATININE 2.48* 2.46* 2.24* 2.18*  CALCIUM 8.9 8.4* 8.2* 8.8*  MG  --   --  2.7*  --   PHOS  --   --  4.1  --     Studies: No results found.  Scheduled Meds:  amLODipine  5 mg Oral Daily   vitamin C  500  mg Oral Daily   aspirin EC  81 mg Oral Daily   atorvastatin  80 mg Oral Daily   [START ON 10/20/2020] azithromycin  250 mg Oral Daily   budesonide (PULMICORT) nebulizer solution  0.25 mg Nebulization BID   And   umeclidinium-vilanterol  1 puff Inhalation Daily   busPIRone  5 mg Oral BID   carvedilol  6.25 mg Oral BID WC   cholecalciferol  5,000 Units Oral Daily   clopidogrel  75 mg Oral Daily   folic acid  1 mg Oral Daily   guaiFENesin  600 mg Oral BID   insulin aspart  0-9 Units Subcutaneous TID WC   insulin detemir  43 Units Subcutaneous QHS   ipratropium-albuterol  3 mL Nebulization Q6H   iron polysaccharides  150 mg Oral Daily   isosorbide mononitrate  60 mg Oral Daily   methylPREDNISolone (SOLU-MEDROL) injection  40 mg Intravenous Q8H   pantoprazole  40 mg Oral Daily   sodium chloride flush  3 mL Intravenous  Q12H   sodium chloride flush  3 mL Intravenous Q12H   traZODone  50 mg Oral QHS   vitamin B-12  1,500 mcg Oral Daily   Continuous Infusions:  sodium chloride     promethazine (PHENERGAN) injection (IM or IVPB) Stopped (10/18/20 0916)   PRN Meds: sodium chloride, acetaminophen **OR** acetaminophen, acetaminophen, albuterol, guaiFENesin, morphine injection, nitroGLYCERIN, ondansetron **OR** ondansetron (ZOFRAN) IV, ondansetron (ZOFRAN) IV, oxyCODONE-acetaminophen, promethazine (PHENERGAN) injection (IM or IVPB), sodium chloride flush, tiZANidine  Time spent: 35 minutes  Author: Max Sane. MD [facility] 10/19/2020 4:12 PM  To reach On-call, see care teams to locate the attending and reach out to them via [email or web address]. If 7PM-7AM, please contact night-coverage If you still have difficulty reaching the attending provider, please page the [facility] (Director on Call) for [facility] on amion for assistance.

## 2020-10-20 ENCOUNTER — Encounter: Payer: Self-pay | Admitting: Internal Medicine

## 2020-10-20 DIAGNOSIS — I10 Essential (primary) hypertension: Secondary | ICD-10-CM

## 2020-10-20 LAB — GLUCOSE, CAPILLARY
Glucose-Capillary: 236 mg/dL — ABNORMAL HIGH (ref 70–99)
Glucose-Capillary: 105 mg/dL — ABNORMAL HIGH (ref 70–99)
Glucose-Capillary: 325 mg/dL — ABNORMAL HIGH (ref 70–99)

## 2020-10-20 LAB — CBC
HCT: 28.3 % — ABNORMAL LOW (ref 36.0–46.0)
Hemoglobin: 8.6 g/dL — ABNORMAL LOW (ref 12.0–15.0)
MCH: 27.2 pg (ref 26.0–34.0)
MCHC: 30.4 g/dL (ref 30.0–36.0)
MCV: 89.6 fL (ref 80.0–100.0)
Platelets: 247 10*3/uL (ref 150–400)
RBC: 3.16 MIL/uL — ABNORMAL LOW (ref 3.87–5.11)
RDW: 16.3 % — ABNORMAL HIGH (ref 11.5–15.5)
WBC: 9.1 10*3/uL (ref 4.0–10.5)
nRBC: 0 % (ref 0.0–0.2)

## 2020-10-20 LAB — BASIC METABOLIC PANEL
Anion gap: 4 — ABNORMAL LOW (ref 5–15)
BUN: 25 mg/dL — ABNORMAL HIGH (ref 8–23)
CO2: 31 mmol/L (ref 22–32)
Calcium: 8.7 mg/dL — ABNORMAL LOW (ref 8.9–10.3)
Chloride: 104 mmol/L (ref 98–111)
Creatinine, Ser: 2.39 mg/dL — ABNORMAL HIGH (ref 0.44–1.00)
GFR, Estimated: 20 mL/min — ABNORMAL LOW (ref >60)
Glucose, Bld: 91 mg/dL (ref 70–99)
Potassium: 4.2 mmol/L (ref 3.5–5.1)
Sodium: 139 mmol/L (ref 135–145)

## 2020-10-20 MED ORDER — BUDESONIDE 0.25 MG/2ML IN SUSP: 0.2500 mg | Freq: Two times a day (BID) | RESPIRATORY_TRACT | 12 refills | Status: DC

## 2020-10-20 MED ORDER — POLYSACCHARIDE IRON COMPLEX 150 MG PO CAPS: 150.0000 mg | ORAL_CAPSULE | Freq: Every day | ORAL | 0 refills | Status: DC

## 2020-10-20 MED ORDER — GUAIFENESIN ER 600 MG PO TB12: 600.0000 mg | ORAL_TABLET | Freq: Two times a day (BID) | ORAL | 0 refills | Status: AC

## 2020-10-20 MED ORDER — NITROGLYCERIN 0.4 MG SL SUBL: 0.4000 mg | SUBLINGUAL_TABLET | SUBLINGUAL | 0 refills | Status: AC | PRN

## 2020-10-20 MED ORDER — PREDNISONE 10 MG (21) PO TBPK: ORAL_TABLET | ORAL | 0 refills | Status: DC

## 2020-10-20 MED FILL — Nitroglycerin SL Tab 0.4 MG: SUBLINGUAL | Qty: 1 | Status: AC

## 2020-10-20 NOTE — TOC Transition Note (Signed)
Transition of Care [facility]) - CM/SW Discharge Note   Patient Details  Name: CHARMELLE SOH MRN: [id number] Date of Birth: 10-25-1940  Transition of Care [facility]) CM/SW Contact:  Alberteen Sam, LCSW Phone Number: 10/20/2020, 9:58 AM   Clinical Narrative:     Patient to discharge home with home health today, patient has been set up with home health PT and RN with Meredeth Ide with [facility] informed of discharge today.   No other needs identified at this time.   Final next level of care: [place] Barriers to Discharge: No Barriers Identified   Patient Goals and CMS Choice Patient states their goals for this hospitalization and ongoing recovery are:: to go home CMS Medicare.gov Compare Post Acute Care list provided to:: Patient Choice offered to / list presented to : Patient  Discharge Placement                    Patient and family notified of of transfer: 10/20/20  Discharge Plan and Services                          HH Arranged: PT, RN [facility] Agency: [place] Date [facility] Agency Contacted: 10/20/20 Time [place]: [phone] Representative spoke with at [place]: [place] ([place]) Interventions     Readmission Risk Interventions Readmission Risk Prevention Plan 10/18/2020  Transportation Screening Complete  Medication Review [employer]) Complete  PCP or Specialist appointment within 3-5 days of discharge Complete  HRI or [place] Complete  SW Recovery Care/Counseling Consult Complete  [place] Not Applicable  Some recent data might be hidden

## 2020-10-22 NOTE — Discharge Summary (Signed)
[facility] NAME: Mckenzie Taylor    MR#:  [id number]  DATE OF BIRTH:  29-Jan-1941  DATE OF ADMISSION:  10/16/2020   ADMITTING PHYSICIAN: Jonetta Osgood, MD  DATE OF DISCHARGE: 10/20/2020 10:22 AM  PRIMARY CARE PHYSICIAN: Denton Lank, MD   ADMISSION DIAGNOSIS:  Unstable angina pectoris ([place]) [I20.0] NSTEMI (non-ST elevated myocardial infarction) ([place]) [I21.4] Chest pain [R07.9] DISCHARGE DIAGNOSIS:  Principal Problem:   Chest pain Active Problems:   GERD (gastroesophageal reflux disease)   Diabetes (HCC)   COPD (chronic obstructive pulmonary disease) ([place])   NSTEMI (non-ST elevated myocardial infarction) ([place])   HTN (hypertension)   Type II diabetes mellitus with renal manifestations ([place])   CKD (chronic kidney disease), stage IV (HCC)   HFrEF (heart failure with reduced ejection fraction) ([place])  SECONDARY DIAGNOSIS:   Past Medical History:  Diagnosis Date   Anemia of chronic disease    Baseline hgb 8.0-8.9   Autosomal recessive polycystic kidneys    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 Estimates quit ~ 2015   GERD (gastroesophageal reflux disease)    HTN (hypertension)    Hyperlipidemia with target LDL less than 70    Hypothyroidism    subclinical. low TSH, normal thyroid panel. biopsy 2010   Vitamin B12 deficiency    HOSPITAL COURSE:  Mckenzie Taylor is a 80 y.o. female with medical history significant of CAD, CKD stage IV, COPD on 2-3 L of oxygen at home, chronic systolic heart  failure, DM-2 admitted for unstable angina.     NSTEMI, presented with chest pain, pt has known CAD-recently admitted in July for similar issues-and had declined LHC.   - s/p cardiac cath on 8/5 showing Diffuse moderate atherosclerosis with subtotal occlusion of diagonal artery and distal LAD stenoses possibly causing symptoms at this time. Interventional Cardio team agrees with medical management due to moderate atherosclerosis and no critical coronary artery disease requiring further intervention. treated with heparin infusion for medical mgmt. - Continue DPAT with aspirin and Plavix, high intensity statin, and beta-blocker - continue Isosorbide beta-blocker and calcium channel blocker for anginal symptoms, High intensity statin,  Cardiac rehabilitation, Dual antiplatelet therapy   COPD with chronic hypoxemic respiratory failure on 2-3 L of oxygen at home: COPD exacerbation - resolved now and at baseline. Treated with steroids, nebs, inhalers and empiric Abx with good response.   Insulin-dependent DM-2: continue home regimen   HTN: BP stable on home regimen   Chronic systolic heart failure: Euvolemic on exam.   CKD stage IV: Creatinine close to baseline-diuretics held while inpt as patient was euvolemic and received contrast for cardiac cath.   Anemia of chronic disease, Hb stable Iron deficiency, Venofer 300 mg IV x1 dose given, started oral supplement. Vitamin B12 level 219, target >400, vitamin B12 1000 mcg IM injection given, she wants to buy this OTC at [facility]. Folate within normal range   Vitamin D insufficiency - continue  vitamin D supplement.   GERD: Continue PPI   Obesity Body mass index is 34.56 kg/m   DISCHARGE CONDITIONS:  stable CONSULTS OBTAINED:   DRUG ALLERGIES:   Allergies  Allergen Reactions   Other Rash    Pt reports allergy to metals. Patient also reports allergic to "ice" and it makes her skin swell where it touches.  She states she can drink water with no  issues.   DISCHARGE MEDICATIONS:   Allergies as of 10/20/2020       Reactions   Other Rash   Pt reports allergy to metals. Patient also reports allergic to "ice" and it makes her skin swell where it touches.  She states she can drink water with no issues.        Medication List     STOP taking these medications    ipratropium-albuterol 0.5-2.5 (3) MG/3ML Soln Commonly known as: DUONEB   torsemide 20 MG tablet Commonly known as: DEMADEX       TAKE these medications    albuterol 108 (90 Base) MCG/ACT inhaler Commonly known as: VENTOLIN HFA Inhale 2 puffs into the lungs every 4 (four) hours as needed for wheezing or shortness of breath.   amLODipine 10 MG tablet Commonly known as: NORVASC Take 10 mg by mouth daily.   aspirin 81 MG EC tablet Take 1 tablet (81 mg total) by mouth daily.   budesonide 0.25 MG/2ML nebulizer solution Commonly known as: PULMICORT Take 2 mLs (0.25 mg total) by nebulization 2 (two) times daily.   busPIRone 5 MG tablet Commonly known as: BUSPAR Take 5 mg by mouth 2 (two) times daily.   carvedilol 6.25 MG tablet Commonly known as: COREG Take 1 tablet (6.25 mg total) by mouth 2 (two) times daily with a meal.   clopidogrel 75 MG tablet Commonly known as: PLAVIX Take 1 tablet (75 mg total) by mouth daily.   Cyanocobalamin 1500 MCG Tbdp Take 4,500 mcg by mouth daily.   Fluticasone-Umeclidin-Vilant 100-62.5-25 MCG/INH Aepb Inhale 1 puff into the lungs daily.   guaiFENesin 600 MG 12 hr tablet Commonly known as: MUCINEX Take 1 tablet (600 mg total) by mouth 2 (two) times daily for 5 days.   insulin detemir 100 UNIT/ML FlexPen Commonly known as: LEVEMIR Inject 42 Units into the skin at bedtime.   insulin lispro 100 UNIT/ML KwikPen Commonly known as: HUMALOG Inject 0-22 Units into the skin 3 (three) times daily with meals. Sliding scale   iron polysaccharides 150 MG capsule Commonly known as: NIFEREX Take 1 capsule (150 mg total) by  mouth daily.   isosorbide-hydrALAZINE 20-37.5 MG tablet Commonly known as: BIDIL Take 0.5 tablets by mouth 3 (three) times daily.   metolazone 2.5 MG tablet Commonly known as: ZAROXOLYN Take 1 tablet (2.5 mg total) by mouth daily as needed (for weight gain > 3 pounds in 1 day).   nitroGLYCERIN 0.4 MG SL tablet Commonly known as: NITROSTAT Place 1 tablet (0.4 mg total) under the tongue every 5 (five) minutes as needed for chest pain.   omeprazole 20 MG capsule Commonly known as: PRILOSEC Take 20 mg by mouth daily.   oxyCODONE-acetaminophen 5-325 MG tablet Commonly known as: PERCOCET/ROXICET Take 1 tablet by mouth at bedtime.   predniSONE 10 MG (21) Tbpk tablet Commonly known as: STERAPRED UNI-PAK 21 TAB Start 60 mg po daily, taper 10 mg daily until finish   promethazine 25 MG tablet Commonly known as: PHENERGAN Take 25 mg by mouth every 6 (six) hours as needed for  nausea.   rosuvastatin 10 MG tablet Commonly known as: CRESTOR Take 10 mg by mouth daily.   tiZANidine 4 MG tablet Commonly known as: ZANAFLEX Take 4 mg by mouth 3 (three) times daily as needed for muscle spasms.   Trulicity 1.5 FW/2.6VZ Sopn Generic drug: Dulaglutide Inject 1.5 mg into the skin every Thursday.   Vitamin D3 125 MCG (5000 UT) Caps Take 5,000 Units by mouth daily.       DISCHARGE INSTRUCTIONS:   DIET:  Cardiac diet DISCHARGE CONDITION:  Stable ACTIVITY:  Activity as tolerated OXYGEN:  Home Oxygen: Yes.    Oxygen Delivery: 3 liters/min via Patient connected to nasal cannula oxygen DISCHARGE LOCATION:  Home with HHPT, RN   If you experience worsening of your admission symptoms, develop shortness of breath, life threatening emergency, suicidal or homicidal thoughts you must seek medical attention immediately by calling 911 or calling your MD immediately  if symptoms less severe.  You Must read complete instructions/literature along with all the possible adverse reactions/side  effects for all the Medicines you take and that have been prescribed to you. Take any new Medicines after you have completely understood and accpet all the possible adverse reactions/side effects.   Please note  You were cared for by a hospitalist during your hospital stay. If you have any questions about your discharge medications or the care you received while you were in the hospital after you are discharged, you can call the unit and asked to speak with the hospitalist on call if the hospitalist that took care of you is not available. Once you are discharged, your primary care physician will handle any further medical issues. Please note that NO REFILLS for any discharge medications will be authorized once you are discharged, as it is imperative that you return to your primary care physician (or establish a relationship with a primary care physician if you do not have one) for your aftercare needs so that they can reassess your need for medications and monitor your lab values.    On the day of Discharge:  VITAL SIGNS:  Blood pressure 134/69, pulse (!) 52, temperature 98.3 F (36.8 C), temperature source Oral, resp. rate 18, height 5\' 5"  (1.651 m), weight 92.2 kg, SpO2 97 %. PHYSICAL EXAMINATION:  GENERAL:  80 y.o.-year-old patient lying in the bed with no acute distress.  EYES: Pupils equal, round, reactive to light and accommodation. No scleral icterus. Extraocular muscles intact.  HEENT: Head atraumatic, normocephalic. Oropharynx and nasopharynx clear.  NECK:  Supple, no jugular venous distention. No thyroid enlargement, no tenderness.  LUNGS: Normal breath sounds bilaterally, no wheezing, rales,rhonchi or crepitation. No use of accessory muscles of respiration.  CARDIOVASCULAR: S1, S2 normal. No murmurs, rubs, or gallops.  ABDOMEN: Soft, non-tender, non-distended. Bowel sounds present. No organomegaly or mass.  EXTREMITIES: No pedal edema, cyanosis, or clubbing.  NEUROLOGIC: Cranial  nerves II through XII are intact. Muscle strength 5/5 in all extremities. Sensation intact. Gait not checked.  PSYCHIATRIC: The patient is alert and oriented x 3.  SKIN: No obvious rash, lesion, or ulcer.  DATA REVIEW:   CBC Recent Labs  Lab 10/20/20 0543  WBC 9.1  HGB 8.6*  HCT 28.3*  PLT 247    Chemistries  Recent Labs  Lab 10/18/20 0516 10/19/20 0548 10/20/20 0543  NA 138   < > 139  K 4.7   < > 4.2  CL 106   < > 104  CO2 29   < > 31  GLUCOSE 139*   < > 91  BUN 25*   < > 25*  CREATININE 2.24*   < > 2.39*  CALCIUM 8.2*   < > 8.7*  MG 2.7*  --   --    < > = values in this interval not displayed.     Outpatient follow-up  Follow-up Information     Corey Skains, MD Follow up on 11/04/2020.   Specialty: Cardiology Why: @ 2:30pm Contact information: [address] [facility] [place] [address] [phone]         Denton Lank, MD. Schedule an appointment as soon as possible for a visit on 10/22/2020.   Specialty: Family Medicine Why: [facility] Discharge F/UP.Marland Kitchen @ 9:20am Contact information: [address] [place] [address] [phone]         Minna Merritts, MD Follow up on 11/27/2020.   Specialty: Cardiology Why: @ 10:30am Contact information: [place] [address] [phone]                 30 Day Unplanned Readmission Risk Score    Flowsheet Row ED to Hosp-Admission (Discharged) from 10/16/2020 in [facility]  30 Day Unplanned Readmission Risk Score (%) 37.3 Filed at 10/20/2020 0801       This score is the patient's risk of an unplanned readmission within 30 days of being discharged (0 -100%). The score is based on dignosis, age, lab data, medications, orders, and past utilization.   Low:  0-14.9   Medium: 15-21.9   High: 22-29.9   Extreme: 30 and above          Management plans discussed with the patient, family and  they are in agreement.  CODE STATUS: Prior   TOTAL TIME TAKING CARE OF THIS PATIENT: 45 minutes.    Max Sane M.D on 10/22/2020 at 2:32 PM  [facility]   CC: Primary care physician; Denton Lank, MD   Note: This dictation was prepared with Dragon dictation along with smaller phrase technology. Any transcriptional errors that result from this process are unintentional.

## 2020-11-10 ENCOUNTER — Other Ambulatory Visit: Payer: Self-pay | Admitting: *Deleted

## 2020-11-10 DIAGNOSIS — I214 Non-ST elevation (NSTEMI) myocardial infarction: Secondary | ICD-10-CM

## 2020-11-27 ENCOUNTER — Other Ambulatory Visit: Payer: Self-pay

## 2020-11-27 ENCOUNTER — Emergency Department (HOSPITAL_COMMUNITY): Payer: Medicare Other

## 2020-11-27 ENCOUNTER — Encounter (HOSPITAL_COMMUNITY): Payer: Self-pay | Admitting: Student in an Organized Health Care Education/Training Program

## 2020-11-27 ENCOUNTER — Observation Stay (HOSPITAL_COMMUNITY): Payer: Medicare Other

## 2020-11-27 ENCOUNTER — Inpatient Hospital Stay (HOSPITAL_COMMUNITY)
Admission: EM | Admit: 2020-11-27 | Discharge: 2020-11-30 | DRG: 177 | Disposition: A | Discharge status: Home Health | Payer: Medicare Other | Attending: Internal Medicine | Admitting: Internal Medicine

## 2020-11-27 ENCOUNTER — Ambulatory Visit: Payer: Medicare Other | Admitting: Physician Assistant

## 2020-11-27 DIAGNOSIS — Z794 Long term (current) use of insulin: Secondary | ICD-10-CM

## 2020-11-27 DIAGNOSIS — N184 Chronic kidney disease, stage 4 (severe): Secondary | ICD-10-CM | POA: Diagnosis present

## 2020-11-27 DIAGNOSIS — I509 Heart failure, unspecified: Secondary | ICD-10-CM | POA: Diagnosis not present

## 2020-11-27 DIAGNOSIS — J440 Chronic obstructive pulmonary disease with acute lower respiratory infection: Secondary | ICD-10-CM | POA: Diagnosis present

## 2020-11-27 DIAGNOSIS — J1282 Pneumonia due to coronavirus disease 2019: Secondary | ICD-10-CM | POA: Diagnosis present

## 2020-11-27 DIAGNOSIS — Z888 Allergy status to other drugs, medicaments and biological substances status: Secondary | ICD-10-CM

## 2020-11-27 DIAGNOSIS — Z8701 Personal history of pneumonia (recurrent): Secondary | ICD-10-CM

## 2020-11-27 DIAGNOSIS — E039 Hypothyroidism, unspecified: Secondary | ICD-10-CM | POA: Diagnosis present

## 2020-11-27 DIAGNOSIS — Z20822 Contact with and (suspected) exposure to covid-19: Secondary | ICD-10-CM | POA: Diagnosis present

## 2020-11-27 DIAGNOSIS — J441 Chronic obstructive pulmonary disease with (acute) exacerbation: Secondary | ICD-10-CM | POA: Diagnosis present

## 2020-11-27 DIAGNOSIS — I5043 Acute on chronic combined systolic (congestive) and diastolic (congestive) heart failure: Secondary | ICD-10-CM | POA: Diagnosis present

## 2020-11-27 DIAGNOSIS — Z96649 Presence of unspecified artificial hip joint: Secondary | ICD-10-CM | POA: Diagnosis present

## 2020-11-27 DIAGNOSIS — E1129 Type 2 diabetes mellitus with other diabetic kidney complication: Secondary | ICD-10-CM | POA: Diagnosis present

## 2020-11-27 DIAGNOSIS — Z825 Family history of asthma and other chronic lower respiratory diseases: Secondary | ICD-10-CM

## 2020-11-27 DIAGNOSIS — D631 Anemia in chronic kidney disease: Secondary | ICD-10-CM | POA: Diagnosis present

## 2020-11-27 DIAGNOSIS — E1122 Type 2 diabetes mellitus with diabetic chronic kidney disease: Secondary | ICD-10-CM | POA: Diagnosis present

## 2020-11-27 DIAGNOSIS — I5023 Acute on chronic systolic (congestive) heart failure: Secondary | ICD-10-CM | POA: Diagnosis present

## 2020-11-27 DIAGNOSIS — E785 Hyperlipidemia, unspecified: Secondary | ICD-10-CM | POA: Diagnosis present

## 2020-11-27 DIAGNOSIS — Z87891 Personal history of nicotine dependence: Secondary | ICD-10-CM

## 2020-11-27 DIAGNOSIS — I130 Hypertensive heart and chronic kidney disease with heart failure and stage 1 through stage 4 chronic kidney disease, or unspecified chronic kidney disease: Secondary | ICD-10-CM | POA: Diagnosis present

## 2020-11-27 DIAGNOSIS — E538 Deficiency of other specified B group vitamins: Secondary | ICD-10-CM | POA: Insufficient documentation

## 2020-11-27 DIAGNOSIS — M87059 Idiopathic aseptic necrosis of unspecified femur: Secondary | ICD-10-CM | POA: Insufficient documentation

## 2020-11-27 DIAGNOSIS — I2510 Atherosclerotic heart disease of native coronary artery without angina pectoris: Secondary | ICD-10-CM | POA: Diagnosis present

## 2020-11-27 DIAGNOSIS — G894 Chronic pain syndrome: Secondary | ICD-10-CM | POA: Diagnosis present

## 2020-11-27 DIAGNOSIS — U071 COVID-19: Principal | ICD-10-CM | POA: Diagnosis present

## 2020-11-27 DIAGNOSIS — K219 Gastro-esophageal reflux disease without esophagitis: Secondary | ICD-10-CM | POA: Diagnosis present

## 2020-11-27 DIAGNOSIS — Z8249 Family history of ischemic heart disease and other diseases of the circulatory system: Secondary | ICD-10-CM

## 2020-11-27 DIAGNOSIS — I252 Old myocardial infarction: Secondary | ICD-10-CM

## 2020-11-27 DIAGNOSIS — Z91048 Other nonmedicinal substance allergy status: Secondary | ICD-10-CM

## 2020-11-27 DIAGNOSIS — Z79899 Other long term (current) drug therapy: Secondary | ICD-10-CM

## 2020-11-27 DIAGNOSIS — J189 Pneumonia, unspecified organism: Secondary | ICD-10-CM | POA: Diagnosis present

## 2020-11-27 DIAGNOSIS — Z7902 Long term (current) use of antithrombotics/antiplatelets: Secondary | ICD-10-CM

## 2020-11-27 DIAGNOSIS — Z7951 Long term (current) use of inhaled steroids: Secondary | ICD-10-CM

## 2020-11-27 DIAGNOSIS — E119 Type 2 diabetes mellitus without complications: Secondary | ICD-10-CM | POA: Diagnosis present

## 2020-11-27 DIAGNOSIS — Z7982 Long term (current) use of aspirin: Secondary | ICD-10-CM

## 2020-11-27 LAB — COMPREHENSIVE METABOLIC PANEL
ALT: 12 U/L (ref 0–44)
AST: 12 U/L — ABNORMAL LOW (ref 15–41)
Albumin: 3 g/dL — ABNORMAL LOW (ref 3.5–5.0)
Alkaline Phosphatase: 45 U/L (ref 38–126)
Anion gap: 12 (ref 5–15)
BUN: 12 mg/dL (ref 8–23)
CO2: 26 mmol/L (ref 22–32)
Calcium: 10.6 mg/dL — ABNORMAL HIGH (ref 8.9–10.3)
Chloride: 101 mmol/L (ref 98–111)
Creatinine, Ser: 1.95 mg/dL — ABNORMAL HIGH (ref 0.44–1.00)
GFR, Estimated: 26 mL/min — ABNORMAL LOW (ref >60)
Glucose, Bld: 169 mg/dL — ABNORMAL HIGH (ref 70–99)
Potassium: 4.3 mmol/L (ref 3.5–5.1)
Sodium: 139 mmol/L (ref 135–145)
Total Bilirubin: 0.7 mg/dL (ref 0.3–1.2)
Total Protein: 6.3 g/dL — ABNORMAL LOW (ref 6.5–8.1)

## 2020-11-27 LAB — SARS CORONAVIRUS 2 (TAT 6-24 HRS): SARS Coronavirus 2: POSITIVE — AB

## 2020-11-27 LAB — CBC
HCT: 35.1 % — ABNORMAL LOW (ref 36.0–46.0)
Hemoglobin: 10.2 g/dL — ABNORMAL LOW (ref 12.0–15.0)
MCH: 26.8 pg (ref 26.0–34.0)
MCHC: 29.1 g/dL — ABNORMAL LOW (ref 30.0–36.0)
MCV: 92.4 fL (ref 80.0–100.0)
Platelets: 304 10*3/uL (ref 150–400)
RBC: 3.8 MIL/uL — ABNORMAL LOW (ref 3.87–5.11)
RDW: 16.9 % — ABNORMAL HIGH (ref 11.5–15.5)
WBC: 9.1 10*3/uL (ref 4.0–10.5)
nRBC: 0 % (ref 0.0–0.2)

## 2020-11-27 LAB — CBG MONITORING, ED
Glucose-Capillary: 143 mg/dL — ABNORMAL HIGH (ref 70–99)
Glucose-Capillary: 185 mg/dL — ABNORMAL HIGH (ref 70–99)

## 2020-11-27 LAB — BRAIN NATRIURETIC PEPTIDE: B Natriuretic Peptide: 1226.5 pg/mL — ABNORMAL HIGH (ref 0.0–100.0)

## 2020-11-27 LAB — TROPONIN I (HIGH SENSITIVITY)
Troponin I (High Sensitivity): 36 ng/L — ABNORMAL HIGH (ref <18)
Troponin I (High Sensitivity): 42 ng/L — ABNORMAL HIGH (ref <18)

## 2020-11-27 LAB — TSH: TSH: 2.081 u[IU]/mL (ref 0.350–4.500)

## 2020-11-27 LAB — GLUCOSE, CAPILLARY: Glucose-Capillary: 140 mg/dL — ABNORMAL HIGH (ref 70–99)

## 2020-11-27 MED ORDER — ASPIRIN EC 81 MG PO TBEC
81.0000 mg | DELAYED_RELEASE_TABLET | Freq: Every day | ORAL | Status: DC
  Administered 2020-11-27 – 2020-11-30 (×4): 81 mg via ORAL
  Filled 2020-11-27 (×4): qty 1

## 2020-11-27 MED ORDER — RIVAROXABAN 10 MG PO TABS: 10.0000 mg | ORAL_TABLET | Freq: Every day | ORAL | Status: DC

## 2020-11-27 MED ORDER — HEPARIN SODIUM (PORCINE) 5000 UNIT/ML IJ SOLN
5000.0000 [IU] | Freq: Three times a day (TID) | INTRAMUSCULAR | Status: DC
  Administered 2020-11-27 – 2020-11-30 (×9): 5000 [IU] via SUBCUTANEOUS
  Filled 2020-11-27 (×9): qty 1

## 2020-11-27 MED ORDER — ROSUVASTATIN CALCIUM 5 MG PO TABS
10.0000 mg | ORAL_TABLET | Freq: Every day | ORAL | Status: DC
  Administered 2020-11-27 – 2020-11-30 (×4): 10 mg via ORAL
  Filled 2020-11-27 (×4): qty 2

## 2020-11-27 MED ORDER — ACETAMINOPHEN 325 MG PO TABS: 650.0000 mg | ORAL_TABLET | Freq: Four times a day (QID) | ORAL | Status: DC | PRN

## 2020-11-27 MED ORDER — OXYCODONE-ACETAMINOPHEN 5-325 MG PO TABS
1.0000 | ORAL_TABLET | Freq: Every day | ORAL | Status: DC
  Administered 2020-11-28 – 2020-11-29 (×2): 1 via ORAL
  Filled 2020-11-27 (×3): qty 1

## 2020-11-27 MED ORDER — SODIUM CHLORIDE 0.9% FLUSH
3.0000 mL | Freq: Two times a day (BID) | INTRAVENOUS | Status: DC
  Administered 2020-11-27 – 2020-11-30 (×7): 3 mL via INTRAVENOUS

## 2020-11-27 MED ORDER — ISOSORB DINITRATE-HYDRALAZINE 20-37.5 MG PO TABS
0.5000 | ORAL_TABLET | Freq: Three times a day (TID) | ORAL | Status: DC
  Administered 2020-11-27 – 2020-11-30 (×8): 0.5 via ORAL
  Filled 2020-11-27: qty 0.5
  Filled 2020-11-27 (×3): qty 1
  Filled 2020-11-27: qty 0.5
  Filled 2020-11-27 (×6): qty 1

## 2020-11-27 MED ORDER — AMLODIPINE BESYLATE 10 MG PO TABS
10.0000 mg | ORAL_TABLET | Freq: Every day | ORAL | Status: DC
  Administered 2020-11-27 – 2020-11-29 (×3): 10 mg via ORAL
  Filled 2020-11-27 (×3): qty 1

## 2020-11-27 MED ORDER — FLUTICASONE FUROATE-VILANTEROL 100-25 MCG/INH IN AEPB
1.0000 | INHALATION_SPRAY | Freq: Every day | RESPIRATORY_TRACT | Status: DC
  Administered 2020-11-28 – 2020-11-30 (×3): 1 via RESPIRATORY_TRACT
  Filled 2020-11-27: qty 28

## 2020-11-27 MED ORDER — ALBUTEROL SULFATE (2.5 MG/3ML) 0.083% IN NEBU: 2.5000 mL | INHALATION_SOLUTION | RESPIRATORY_TRACT | Status: DC | PRN

## 2020-11-27 MED ORDER — CARVEDILOL 6.25 MG PO TABS
6.2500 mg | ORAL_TABLET | Freq: Two times a day (BID) | ORAL | Status: DC
  Administered 2020-11-27 – 2020-11-30 (×5): 6.25 mg via ORAL
  Filled 2020-11-27 (×4): qty 1
  Filled 2020-11-27: qty 2
  Filled 2020-11-27 (×2): qty 1

## 2020-11-27 MED ORDER — INSULIN DETEMIR 100 UNIT/ML ~~LOC~~ SOLN
32.0000 [IU] | Freq: Every day | SUBCUTANEOUS | Status: DC
  Administered 2020-11-27 – 2020-11-29 (×3): 32 [IU] via SUBCUTANEOUS
  Filled 2020-11-27 (×4): qty 0.32

## 2020-11-27 MED ORDER — FLUTICASONE-UMECLIDIN-VILANT 100-62.5-25 MCG/INH IN AEPB: 1.0000 | INHALATION_SPRAY | Freq: Every day | RESPIRATORY_TRACT | Status: DC

## 2020-11-27 MED ORDER — FUROSEMIDE 10 MG/ML IJ SOLN
40.0000 mg | Freq: Once | INTRAMUSCULAR | Status: AC
  Administered 2020-11-27: 40 mg via INTRAVENOUS
  Filled 2020-11-27: qty 4

## 2020-11-27 MED ORDER — INSULIN ASPART 100 UNIT/ML IJ SOLN
0.0000 [IU] | Freq: Three times a day (TID) | INTRAMUSCULAR | Status: DC
  Administered 2020-11-27: 2 [IU] via SUBCUTANEOUS
  Administered 2020-11-27 – 2020-11-28 (×2): 3 [IU] via SUBCUTANEOUS
  Administered 2020-11-28 (×2): 2 [IU] via SUBCUTANEOUS
  Administered 2020-11-29: 3 [IU] via SUBCUTANEOUS
  Administered 2020-11-29: 5 [IU] via SUBCUTANEOUS
  Administered 2020-11-30 (×2): 3 [IU] via SUBCUTANEOUS

## 2020-11-27 MED ORDER — ONDANSETRON HCL 4 MG/2ML IJ SOLN
4.0000 mg | Freq: Once | INTRAMUSCULAR | Status: AC
  Administered 2020-11-27: 4 mg via INTRAVENOUS
  Filled 2020-11-27: qty 2

## 2020-11-27 MED ORDER — BUSPIRONE HCL 5 MG PO TABS
5.0000 mg | ORAL_TABLET | Freq: Two times a day (BID) | ORAL | Status: DC
  Administered 2020-11-27 – 2020-11-30 (×7): 5 mg via ORAL
  Filled 2020-11-27 (×7): qty 1

## 2020-11-27 MED ORDER — PROMETHAZINE HCL 25 MG PO TABS
25.0000 mg | ORAL_TABLET | Freq: Four times a day (QID) | ORAL | Status: DC | PRN
  Administered 2020-11-27 – 2020-11-29 (×3): 25 mg via ORAL
  Filled 2020-11-27 (×3): qty 1

## 2020-11-27 MED ORDER — TIZANIDINE HCL 4 MG PO TABS: 4.0000 mg | ORAL_TABLET | Freq: Three times a day (TID) | ORAL | Status: DC | PRN

## 2020-11-27 MED ORDER — ACETAMINOPHEN 650 MG RE SUPP: 650.0000 mg | Freq: Four times a day (QID) | RECTAL | Status: DC | PRN

## 2020-11-27 MED ORDER — BUDESONIDE 0.25 MG/2ML IN SUSP
0.2500 mg | Freq: Two times a day (BID) | RESPIRATORY_TRACT | Status: DC
  Administered 2020-11-27 – 2020-11-30 (×3): 0.25 mg via RESPIRATORY_TRACT
  Filled 2020-11-27 (×3): qty 2

## 2020-11-27 MED ORDER — TRAZODONE HCL 50 MG PO TABS
50.0000 mg | ORAL_TABLET | Freq: Every day | ORAL | Status: DC
  Administered 2020-11-27 – 2020-11-29 (×3): 50 mg via ORAL
  Filled 2020-11-27 (×3): qty 1

## 2020-11-27 MED ORDER — CLOPIDOGREL BISULFATE 75 MG PO TABS
75.0000 mg | ORAL_TABLET | Freq: Every day | ORAL | Status: DC
  Administered 2020-11-27 – 2020-11-30 (×4): 75 mg via ORAL
  Filled 2020-11-27 (×4): qty 1

## 2020-11-27 MED ORDER — UMECLIDINIUM BROMIDE 62.5 MCG/INH IN AEPB
1.0000 | INHALATION_SPRAY | Freq: Every day | RESPIRATORY_TRACT | Status: DC
  Administered 2020-11-28 – 2020-11-30 (×3): 1 via RESPIRATORY_TRACT
  Filled 2020-11-27: qty 7

## 2020-11-27 MED ORDER — PANTOPRAZOLE SODIUM 40 MG PO TBEC
40.0000 mg | DELAYED_RELEASE_TABLET | Freq: Every day | ORAL | Status: DC
  Administered 2020-11-27 – 2020-11-30 (×4): 40 mg via ORAL
  Filled 2020-11-27 (×4): qty 1

## 2020-11-27 MED ORDER — NITROGLYCERIN 0.4 MG SL SUBL
0.4000 mg | SUBLINGUAL_TABLET | SUBLINGUAL | Status: DC | PRN
  Administered 2020-11-30 (×2): 0.4 mg via SUBLINGUAL
  Filled 2020-11-27 (×2): qty 1

## 2020-11-27 MED ORDER — INFLUENZA VAC A&B SA ADJ QUAD 0.5 ML IM PRSY
0.5000 mL | PREFILLED_SYRINGE | INTRAMUSCULAR | Status: DC | PRN
  Filled 2020-11-27: qty 0.5

## 2020-11-27 NOTE — ED Provider Notes (Signed)
[facility] EMERGENCY DEPARTMENT Provider Note   CSN: [id number] Arrival date & time: 11/27/20  2694     History Chief Complaint  Patient presents with   Chest Pain    Mckenzie Taylor is a 80 y.o. female with history of coronary artery disease, CHF, and COPD who presents the emergency department today for chest pain that began yesterday morning.  She states her chest pain is substernal in localization and does not radiate.  She describes the pain as a squeezing/heaviness sensation.  Nitroglycerin given prior to arrival relieved her chest pain.  No other exacerbating factors.  She reports associated nausea, vomiting, and shortness of breath which is unchanged from her baseline.  She denies any leg pain, leg swelling, abdominal pain, diarrhea, sore throat, fever, chills, urinary complaints, and constipation. She is normally on 3L of oxygen at baseline. Has not needed any increase in oxygen requirements.   The history is provided by the patient. No language interpreter was used.  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 Estimates quit ~ 2015   GERD (gastroesophageal reflux disease)    HTN (hypertension)    Hyperlipidemia with target LDL less than 70    Hypothyroidism    subclinical. low TSH, normal thyroid panel. 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/2020   CKD (chronic kidney disease), stage IV ([place]) 09/16/2020   Depression 09/16/2020   CAD (coronary artery disease) 09/16/2020   Chronic diastolic CHF (congestive heart failure) ([place]) 09/16/2020   Hyperkalemia 09/16/2020   Positive D dimer 09/16/2020   Demand ischemia ([place]) 11/10/2018   Acute on chronic combined systolic and diastolic CHF (congestive heart failure) ([place]) 11/10/2018   Chest pain 11/08/2018   Cellulitis of right leg 02/10/2018   COPD exacerbation ([place]) 01/04/2018   CHF (congestive heart failure) ([place]) 01/06/2017   COPD (chronic obstructive pulmonary disease) ([place]) 11/25/2016   Nausea and vomiting 10/29/2016   Acute on chronic congestive heart failure ([place])    Acute pulmonary edema ([place])    Acute respiratory failure with hypoxia ([place]) 10/25/2016   COPD with acute exacerbation ([place]) 08/14/2016   CAP (community acquired pneumonia) 27/05/5007   Acute systolic CHF (congestive heart failure) ([place]) 08/14/2016   Accelerated hypertension 08/14/2016   GERD (gastroesophageal reflux disease) 08/14/2016   Diabetes ([place]) 08/14/2016   Respiratory distress 05/05/2016   Acute respiratory failure ([place]) 03/24/2016  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   TOBACCO ABUSE 12/17/2008   Coronary atherosclerosis of native coronary artery 12/17/2008   ATHEROSLERO NATV ART EXTREM W/INTERMIT CLAUDICAT 12/17/2008   CLAUDICATION, INTERMITTENT 12/17/2008    Past Surgical History:  Procedure Laterality Date   CHOLECYSTECTOMY     hip replacement     bilateral-secondary to avascular necrosis   LEFT HEART CATH AND CORONARY  ANGIOGRAPHY N/A 10/17/2020   Procedure: LEFT HEART CATH AND CORONARY ANGIOGRAPHY;  Surgeon: Corey Skains, MD;  Location: [facility];  Service: Cardiovascular;  Laterality: N/A;   right eye lens replacement     ULNAR NERVE REPAIR     bilateral   VESICOVAGINAL FISTULA CLOSURE W/ TAH       OB History   No obstetric history on file.     Family History  Problem Relation Age of Onset   Cancer Father        lung   COPD Mother    Heart disease Mother    Heart disease Maternal Uncle     Social History   Tobacco Use   Smoking status: Former    Packs/day: 1.00    Years: 50.00    Pack years: 50.00    Types: Cigarettes   Smokeless tobacco: Never   Tobacco comments:    1 ppd - 50 years   Substance Use Topics   Alcohol use: No   Drug use: No    Home Medications Prior to Admission medications   Medication Sig Start Date End Date Taking? Authorizing Provider  albuterol (VENTOLIN HFA) 108 (90 Base) MCG/ACT inhaler Inhale 2 puffs into the lungs every 4 (four) hours as needed for wheezing or shortness of breath.   Yes [provider]  amLODipine (NORVASC) 10 MG tablet Take 10 mg by mouth at bedtime.   Yes [provider]  aspirin EC 81 MG EC tablet Take 1 tablet (81 mg total) by mouth daily. 11/11/18  Yes Gladstone Lighter, MD  budesonide (PULMICORT) 0.25 MG/2ML nebulizer solution Take 2 mLs (0.25 mg total) by nebulization 2 (two) times daily. 10/20/20  Yes Max Sane, MD  busPIRone (BUSPAR) 5 MG tablet Take 5 mg by mouth 2 (two) times daily.   Yes [provider]  carvedilol (COREG) 6.25 MG tablet Take 1 tablet (6.25 mg total) by mouth 2 (two) times daily with a meal. 11/11/18  Yes Gladstone Lighter, MD  clopidogrel (PLAVIX) 75 MG tablet Take 1 tablet (75 mg total) by mouth daily. 09/19/20 12/18/20 Yes Enzo Bi, MD  Dulaglutide (TRULICITY) 1.5 HQ/4.6NG SOPN Inject 1.5 mg into the skin every Thursday.   Yes [provider]   Fluticasone-Umeclidin-Vilant 100-62.5-25 MCG/INH AEPB Inhale 1 puff into the lungs daily.   Yes [provider]  insulin detemir (LEVEMIR) 100 UNIT/ML FlexPen Inject 42 Units into the skin at bedtime.   Yes [provider]  insulin lispro (HUMALOG) 100 UNIT/ML KwikPen Inject 0-22 Units into the skin 3 (three) times daily with meals. Sliding scale 10/26/18  Yes [provider]  isosorbide-hydrALAZINE (BIDIL) 20-37.5 MG tablet Take 0.5 tablets by mouth 3 (three) times daily. 09/18/20 12/17/20 Yes Enzo Bi, MD  metolazone (ZAROXOLYN) 2.5 MG tablet Take 1 tablet (2.5 mg total) by mouth daily as needed (for weight gain > 3 pounds in 1 day). 11/11/18  Yes Gladstone Lighter, MD  nitroGLYCERIN (NITROSTAT) 0.4 MG SL tablet Place 1 tablet (0.4 mg total) under the tongue every 5 (five) minutes as needed for chest pain. 10/20/20  Yes Max Sane, MD  oxyCODONE-acetaminophen (PERCOCET/ROXICET) 5-325 MG tablet Take 1 tablet by mouth at bedtime. 09/08/20  Yes [provider]  promethazine (PHENERGAN) 25 MG tablet Take 25 mg by mouth every 6 (six) hours as needed for nausea.    Yes [provider]  rosuvastatin (CRESTOR) 10 MG tablet Take 10 mg by mouth daily.   Yes [provider]  tiZANidine (ZANAFLEX) 4 MG tablet Take 4 mg by mouth 3 (three) times daily as needed for muscle spasms.    Yes [provider]  traZODone (DESYREL) 50 MG tablet Take 50 mg by mouth at bedtime. 10/28/20  Yes [provider]  Cholecalciferol (VITAMIN D3) 5000 units CAPS Take 5,000 Units by mouth daily.    [provider]  Cyanocobalamin 1500 MCG TBDP Take 4,500 mcg by mouth daily.    [provider]  iron polysaccharides (NIFEREX) 150 MG capsule Take 1 capsule (150 mg total) by mouth daily. 10/20/20 11/19/20  Max Sane, MD  omeprazole (PRILOSEC) 20 MG capsule Take 20 mg by mouth daily.    [provider]  predniSONE (STERAPRED UNI-PAK 21 TAB) 10 MG  (21) TBPK tablet Start 60 mg po daily, taper 10 mg daily until finish Patient not taking: No sig reported 10/20/20   Max Sane, MD    Allergies    Other  Review of Systems   Review of Systems  Cardiovascular:  Positive for chest pain.  Genitourinary:  Negative for dysuria, hematuria and urgency.  All other systems reviewed and are negative.  Physical Exam Updated Vital Signs BP 132/80 (BP Location: Right Arm)   Pulse (!) 109   Temp 98.6 F (37 C)   Resp 17   Ht 5\' 5"  (1.651 m)   Wt 89.8 kg   SpO2 93%   BMI 32.95 kg/m   Physical Exam Constitutional:      General: She is not in acute distress.    Appearance: Normal appearance.  HENT:     Head: Normocephalic and atraumatic.  Eyes:     General:        Right eye: No discharge.        Left eye: No discharge.  Cardiovascular:     Comments: Regular rate and rhythm.  Heart sounds are diminished. 3 out of 6 systolic murmur.  Radial pulses are 2+ bilaterally.  Dorsalis pedis pulses are 2+ bilaterally.  1+ pitting edema up to the midshin bilaterally. Pulmonary:     Comments: Decreased effort.  No respiratory distress.  Mild diffuse rales.  Coughing throughout examination. Abdominal:     General: Abdomen is flat. Bowel sounds are normal. There is no distension.     Tenderness: There is no abdominal tenderness. There is no guarding or rebound.  Musculoskeletal:        General: Normal range of motion.     Cervical back: Neck supple.  Skin:    General: Skin is warm and dry.     Findings: No rash.  Neurological:     General: No focal deficit present.     Mental Status: She is alert.  Psychiatric:        Mood and Affect: Mood normal.        Behavior: Behavior normal.    ED Results / Procedures / Treatments   Labs (all labs ordered are listed, but only abnormal results are displayed) Labs Reviewed  CBC - Abnormal; Notable for the following components:      Result Value  RBC 3.80 (*)    Hemoglobin 10.2 (*)    HCT 35.1  (*)    MCHC 29.1 (*)    RDW 16.9 (*)    All other components within normal limits  COMPREHENSIVE METABOLIC PANEL - Abnormal; Notable for the following components:   Glucose, Bld 169 (*)    Creatinine, Ser 1.95 (*)    Calcium 10.6 (*)    Total Protein 6.3 (*)    Albumin 3.0 (*)    AST 12 (*)    GFR, Estimated 26 (*)    All other components within normal limits  BRAIN NATRIURETIC PEPTIDE - Abnormal; Notable for the following components:   B Natriuretic Peptide 1,226.5 (*)    All other components within normal limits  TROPONIN I (HIGH SENSITIVITY) - Abnormal; Notable for the following components:   Troponin I (High Sensitivity) 36 (*)    All other components within normal limits  TROPONIN I (HIGH SENSITIVITY) - Abnormal; Notable for the following components:   Troponin I (High Sensitivity) 42 (*)    All other components within normal limits  SARS CORONAVIRUS 2 (TAT 6-24 HRS)  TSH    EKG EKG Interpretation  Date/Time:  Thursday November 27 2020 07:50:11 EDT Ventricular Rate:  109 PR Interval:  139 QRS Duration: 89 QT Interval:  353 QTC Calculation: 476 R Axis:   8 Text Interpretation: Sinus tachycardia similar to earlier in day and Aug 2022 Confirmed by Sherwood Gambler [phone]) on 11/27/2020 8:33:07 AM  Radiology DG Chest 2 View  Result Date: 11/27/2020 CLINICAL DATA:  Chest pain.  X3 days. EXAM: CHEST - 2 VIEW COMPARISON:  Chest radiograph, 10/16/2020.  CT chest, 01/05/2008. FINDINGS: Mild interval enlargement of the cardiac silhouette, and new obscuration of the RIGHT heart border. Aortic vascular calcifications. Low lung volumes. The LEFT chest is clear. Middle lobe consolidation obscuring the RIGHT hemidiaphragm. New, at least, moderate volume RIGHT pleural effusion. No pneumothorax. No acute osseous abnormality. IMPRESSION: Middle lobe consolidation and new, moderate volume RIGHT pleural effusion. Differential diagnosis includes pneumonia with parapneumonic effusion. Pleural  effusion and atelectasis can also appear similar. Electronically Signed   By: Michaelle Birks M.D.   On: 11/27/2020 08:48    Procedures Procedures   Medications Ordered in ED Medications  sodium chloride flush (NS) 0.9 % injection 3 mL (has no administration in time range)  acetaminophen (TYLENOL) tablet 650 mg (has no administration in time range)    Or  acetaminophen (TYLENOL) suppository 650 mg (has no administration in time range)  ondansetron (ZOFRAN) injection 4 mg (4 mg Intravenous Given 11/27/20 0931)  furosemide (LASIX) injection 40 mg (40 mg Intravenous Given 11/27/20 1015)    ED Course  I have reviewed the triage vital signs and the nursing notes.  Pertinent labs & imaging results that were available during my care of the patient were reviewed by me and considered in my medical decision making (see chart for details).  Clinical Course as of 11/27/20 1158  Thu Nov 27, 2020  0900 I discussed this case with my attending physician who cosigned this note including patient's presenting symptoms, physical exam, and planned diagnostics and interventions. Attending physician stated agreement with plan or made changes to plan which were implemented.   Attending physician assessed patient at bedside.   [CF]  1118 Spoke with Harvie Heck, MD and she agrees to admit to the internal medicine residency service. [CF]    Clinical Course User Index [CF] Cherrie Gauze   MDM Rules/Calculators/A&P  Mckenzie Taylor is a 80 y.o. female with history of congestive heart failure, COPD, coronary artery disease, and MI who presents the emergency department for 1 day history of chest pain.  Given the history and physical exam this is likely an acute decompensated heart failure causing volume overload and pulmonary edema.  Other etiologies considered include ACS, valvular disease, arrhythmia, and dissection. Blood pressures have been stable in the department and she has  been CP free. Dissection is less likely.   CBC reveals chronic ongoing anemia which is improved from a month ago.  CMP revealed elevated creatinine which is improved from a month ago, slight elevation in calcium, and elevated glucose.  Initial troponin was elevated slightly.  This is likely demand ischemia from CHF.  BNP was 1200.  COVID and second troponin is pending.  Chest x-ray revealed moderate right sided pleural effusion with questionable consolidation in the middle lobe.  Clinically she is without leukocytosis and has been afebrile.  This is likely due to her underlying CHF exacerbation.  Will defer antibiotics for now.  EKG was without arrhythmia.  Given the clinical scenario, this is likely an acute decompensated heart failure which is causing volume overload and pulmonary edema.  IV Lasix was started in the department. I believe she would benefit from further evaluation in the hospital and will be admitted to the internal medicine residency service.  Final Clinical Impression(s) / ED Diagnoses Final diagnoses:  Acute on chronic congestive heart failure, unspecified heart failure type [facility])    Rx / DC Orders ED Discharge Orders     None        Hendricks Limes, [place] 11/27/20 1158    Sherwood Gambler, MD 11/27/20 1454

## 2020-11-27 NOTE — H&P (Signed)
Date: 11/27/2020               Patient Name:  Mckenzie Taylor MRN: [id number]  DOB: 1940-06-12 Age / Sex: 80 y.o., female   PCP: Denton Lank, MD         Medical Service: Internal Medicine Teaching Service         Attending Physician: Dr. Evette Doffing, Mallie Mussel, *    First Contact: Christiana Fuchs, DO Pager: JH [phone]  Second Contact: Hadassah Pais, MD Pager: Rudean Curt [phone]       After Hours (After 5p/  First Contact Pager: [phone]  weekends / holidays): Second Contact Pager: [phone]   SUBJECTIVE  Chief Complaint: chest pain  History of Present Illness: Mckenzie Taylor is a 80 y.o. female with a pertinent PMH of coronary artery disease with recent admission one month prior for NSTEMI, COPD on 2-3L at baseline, CKD IV, anemia of chronic disease, type 2 diabetes mellitus, HFrEF (EF 25-30%), who presents to [facility] with chest pain.  [name] was in her usual state of health until yesterday when she started having substernal squeezing pain/heaviness sensation without radiation. She reports similar feeling with her prior MI's and reports that she was concerned when her chest pain persisted despite taking aspirin. She notes associated shortness of breath that persisted despite her rescue inhalers and requiring increasing oxygen to 4L. Her chest pain improved with nitroglycerin administration by EMS. She denies any fevers/chills, worsening cough, headache, abdominal pain, nausea/vomiting, or focal weakness.   Medications: No current facility-administered medications on file prior to encounter.   Current Outpatient Medications on File Prior to Encounter  Medication Sig Dispense Refill   albuterol (VENTOLIN HFA) 108 (90 Base) MCG/ACT inhaler Inhale 2 puffs into the lungs every 4 (four) hours as needed for wheezing or shortness of breath.     amLODipine (NORVASC) 10 MG tablet Take 10 mg by mouth at bedtime.     aspirin EC 81 MG EC tablet Take 1 tablet (81 mg total) by mouth daily. 30  tablet 2   budesonide (PULMICORT) 0.25 MG/2ML nebulizer solution Take 2 mLs (0.25 mg total) by nebulization 2 (two) times daily. 60 mL 12   busPIRone (BUSPAR) 5 MG tablet Take 5 mg by mouth 2 (two) times daily.     carvedilol (COREG) 6.25 MG tablet Take 1 tablet (6.25 mg total) by mouth 2 (two) times daily with a meal. 60 tablet 2   clopidogrel (PLAVIX) 75 MG tablet Take 1 tablet (75 mg total) by mouth daily. 90 tablet 0   Dulaglutide (TRULICITY) 1.5 OV/7.8HY SOPN Inject 1.5 mg into the skin every Thursday.     Fluticasone-Umeclidin-Vilant 100-62.5-25 MCG/INH AEPB Inhale 1 puff into the lungs daily.     insulin detemir (LEVEMIR) 100 UNIT/ML FlexPen Inject 42 Units into the skin at bedtime.     insulin lispro (HUMALOG) 100 UNIT/ML KwikPen Inject 0-22 Units into the skin 3 (three) times daily with meals. Sliding scale     isosorbide-hydrALAZINE (BIDIL) 20-37.5 MG tablet Take 0.5 tablets by mouth 3 (three) times daily. 135 tablet 0   metolazone (ZAROXOLYN) 2.5 MG tablet Take 1 tablet (2.5 mg total) by mouth daily as needed (for weight gain > 3 pounds in 1 day). 30 tablet 1   nitroGLYCERIN (NITROSTAT) 0.4 MG SL tablet Place 1 tablet (0.4 mg total) under the tongue every 5 (five) minutes as needed for chest pain. 30 tablet 0   oxyCODONE-acetaminophen (PERCOCET/ROXICET) 5-325 MG tablet Take 1 tablet  by mouth at bedtime.     promethazine (PHENERGAN) 25 MG tablet Take 25 mg by mouth every 6 (six) hours as needed for nausea.      rosuvastatin (CRESTOR) 10 MG tablet Take 10 mg by mouth daily.     tiZANidine (ZANAFLEX) 4 MG tablet Take 4 mg by mouth 3 (three) times daily as needed for muscle spasms.      traZODone (DESYREL) 50 MG tablet Take 50 mg by mouth at bedtime.     Cholecalciferol (VITAMIN D3) 5000 units CAPS Take 5,000 Units by mouth daily.     Cyanocobalamin 1500 MCG TBDP Take 4,500 mcg by mouth daily.     iron polysaccharides (NIFEREX) 150 MG capsule Take 1 capsule (150 mg total) by mouth daily. 30  capsule 0   omeprazole (PRILOSEC) 20 MG capsule Take 20 mg by mouth daily.     predniSONE (STERAPRED UNI-PAK 21 TAB) 10 MG (21) TBPK tablet Start 60 mg po daily, taper 10 mg daily until finish (Patient not taking: No sig reported) 21 tablet 0    Past Medical History:  Past Medical History:  Diagnosis Date   Anemia of chronic disease    Baseline hgb 8.0-8.9   Autosomal recessive polycystic kidneys    Avascular necrosis of hip (HCC)    bilateral   CAD (coronary artery disease) 2004   2004 LHC with mild and nonobstructive CAD: 20% mid LAD stenosis   Chronic combined systolic and diastolic heart failure (HCC)    HFrEF, EF 45% last echo, LVH, diastolic dysfunction 1025   Chronic kidney disease    PCKD, CKD, adrenal adenoma, cyst   COPD (chronic obstructive pulmonary disease) ([place])    3L home oxygen   Diabetes type 2, controlled ([place])    Former heavy tobacco smoker    1 pk / day. Estimates quit ~ 2015   GERD (gastroesophageal reflux disease)    HTN (hypertension)    Hyperlipidemia with target LDL less than 70    Hypothyroidism    subclinical. low TSH, normal thyroid panel. biopsy 2010   Vitamin B12 deficiency     Social:  Patient lives at home in a house with her cousin. She is independent in her ADL's and does not require any assistance with ambulation at baseline. She has two sons, a daughter-in-law and five grandchildren, the oldest being 30. She reports prior history of tobacco use with up to 3 packs per day since the age of 34. She quit smoking 5 years ago. She denies any history of alcohol use or illicit substance use.   Family History: Family History  Problem Relation Age of Onset   Cancer Father        lung   COPD Mother    Heart disease Mother    Heart disease Maternal Uncle     Allergies: Allergies as of 11/27/2020 - Review Complete 11/27/2020  Allergen Reaction Noted   Other Rash 10/25/2016    Review of Systems: A complete ROS was negative except as per HPI.    OBJECTIVE:  Physical Exam: Blood pressure 129/86, pulse (!) 101, temperature 98.6 F (37 C), resp. rate 19, height 5\' 5"  (1.651 m), weight 89.8 kg, SpO2 100 %. Physical Exam  Constitutional: Elderly, chronically ill appearing female, sitting on edge of bed, no acute distress.  HENT: Normocephalic and atraumatic, EOMI, conjunctiva normal, moist mucous membranes Cardiovascular: Normal rate, regular rhythm, S1 and S2 present, +systolic murmur; no rubs, gallops.  Distal pulses intact Respiratory: Effort normal  without accessory muscle use on 3L O2; diminished lung sounds with faint crackles diffusely   GI: Nondistended, soft, nontender to palpation, normal bowel sounds Musculoskeletal: Normal bulk and tone.  1+ pitting edema to mid-tibia in bilateral lower extremities Neurological: Is alert and oriented x4, no apparent focal deficits noted. Skin: Warm and dry.  No rash, erythema, lesions noted. Psychiatric: Normal mood and affect. Behavior is normal. Judgment and thought content normal.    Pertinent Labs: CBC    Component Value Date/Time   WBC 9.1 11/27/2020 0831   RBC 3.80 (L) 11/27/2020 0831   HGB 10.2 (L) 11/27/2020 0831   HGB 11.2 (L) 11/15/2013 1148   HCT 35.1 (L) 11/27/2020 0831   HCT 35.8 11/15/2013 1148   PLT 304 11/27/2020 0831   PLT 272 11/15/2013 1148   MCV 92.4 11/27/2020 0831   MCV 94 11/15/2013 1148   MCH 26.8 11/27/2020 0831   MCHC 29.1 (L) 11/27/2020 0831   RDW 16.9 (H) 11/27/2020 0831   RDW 15.8 (H) 11/15/2013 1148   LYMPHSABS 1.5 02/10/2018 1705   LYMPHSABS 1.4 10/22/2012 0558   MONOABS 0.7 02/10/2018 1705   MONOABS 0.9 10/22/2012 0558   EOSABS 0.0 02/10/2018 1705   EOSABS 0.0 10/22/2012 0558   BASOSABS 0.1 02/10/2018 1705   BASOSABS 0.1 10/22/2012 0558     CMP     Component Value Date/Time   NA 139 11/27/2020 0831   NA 139 10/22/2012 0558   K 4.3 11/27/2020 0831   K 3.6 11/15/2013 1148   CL 101 11/27/2020 0831   CL 104 10/22/2012 0558   CO2 26  11/27/2020 0831   CO2 28 10/22/2012 0558   GLUCOSE 169 (H) 11/27/2020 0831   GLUCOSE 120 (H) 10/22/2012 0558   BUN 12 11/27/2020 0831   BUN 17 10/22/2012 0558   CREATININE 1.95 (H) 11/27/2020 0831   CREATININE 1.52 (H) 10/22/2012 0558   CALCIUM 10.6 (H) 11/27/2020 0831   CALCIUM 8.6 10/22/2012 0558   PROT 6.3 (L) 11/27/2020 0831   PROT 7.4 10/21/2012 1145   ALBUMIN 3.0 (L) 11/27/2020 0831   ALBUMIN 2.5 (L) 10/21/2012 1145   AST 12 (L) 11/27/2020 0831   AST 79 (H) 10/21/2012 1145   ALT 12 11/27/2020 0831   ALT 77 10/21/2012 1145   ALKPHOS 45 11/27/2020 0831   ALKPHOS 221 (H) 10/21/2012 1145   BILITOT 0.7 11/27/2020 0831   BILITOT 0.6 10/21/2012 1145   GFRNONAA 26 (L) 11/27/2020 0831   GFRNONAA 34 (L) 10/22/2012 0558   GFRAA 24 (L) 11/11/2018 0533   GFRAA 40 (L) 10/22/2012 0558    Pertinent Imaging: DG Chest 2 View  Result Date: 11/27/2020 CLINICAL DATA:  Chest pain.  X3 days. EXAM: CHEST - 2 VIEW COMPARISON:  Chest radiograph, 10/16/2020.  CT chest, 01/05/2008. FINDINGS: Mild interval enlargement of the cardiac silhouette, and new obscuration of the RIGHT heart border. Aortic vascular calcifications. Low lung volumes. The LEFT chest is clear. Middle lobe consolidation obscuring the RIGHT hemidiaphragm. New, at least, moderate volume RIGHT pleural effusion. No pneumothorax. No acute osseous abnormality. IMPRESSION: Middle lobe consolidation and new, moderate volume RIGHT pleural effusion. Differential diagnosis includes pneumonia with parapneumonic effusion. Pleural effusion and atelectasis can also appear similar. Electronically Signed   By: Michaelle Birks M.D.   On: 11/27/2020 08:48    EKG: personally reviewed my interpretation is nonspecific ST and T waves changes, sinus tachycardia, prolonged QT interval  ASSESSMENT & PLAN:  Assessment: Active Problems:   Acute exacerbation  of CHF (congestive heart failure) (HCC)   Mckenzie Taylor is a 80 y.o. with pertinent PMH of HFrEF  (EF 25-30%), CAD with recent NSTEMI in August, Type II diabetes mellitus, COPD on 2-3L O2 at baseline, CKDIV and anemia of chronic disease who presented with chest pain and admit for acute HFrEF exacerbation on hospital day 0  Plan: #Acute on chronic HFrEF exacerbation Patient presented with substernal chest pain and shortness of breath that improved with nitroglycerin administration. On exam, patient has decreased breath sounds bilaterally with faint crackles and 1+ pitting edema of bilateral lower extremities. BNP elevated to 1226 and CXR consistent with right sided pleural effusion. Patient is prescribed metolazone 2.5mg  prn for volume overload; however, has not taken this in several weeks. Presentation today is most consistent with acute heart failure exacerbation - IV Lasix 40mg  - Continue carvedilol 6.25mg  twice daily - Strict intake and output - Daily weights   #Coronary artery disease  Patient has a history of coronary artery disease with recent NSTEMI in August. Left heart cath at the time revealed moderate diffuse three vessel coronary artery disease with subtotal small diagonal stenosis and was recommended for medical management. Patient's initial chest pain improved with nitroglycerin and her troponins were only mildly elevated at 36>42, likely in setting of heart failure exacerbation as above. Patient is chest pain free on my evaluation.  - Continue aspirin 81mg  daily and plavix 75mg  daily - Continue BiDil 0.5 tid  - Nitrostate 0.4mg  q28min prn  - Continue crestor 10mg  daily   #Chronic renal failure (Stage IV) #Anemia of chronic disease  Patient's baseline sCr appears to be 2.2-2.4. Current sCr of 1.95. Will continue to monitor in setting of diuresis as above.  - Trend renal function - Avoid nephrotoxic agents as able   #Type 2 diabetes mellitus  Patient has a history of insulin dependent diabetes for which she is on levemir 42 units nightly with sliding scale insulin and trulicty  once weekly. CBG on presentation 140's.  - Levemir 32U nightly + SSI  - Carb modified diet   #COPD  Patient has a history of COPD for which she is on 2-3 L oxygen at baseline. She takes Trelegy Ellipta and pulmicort nebulizer. She has a rescue inhaler. She reports chronic cough that is unchanged from baseline.  - Continue oxygen supplementation for goal SpO2 88-92% - Continue pulmicort nebulizer bid - Continue Trelegy Ellipta 1 puff daily   #Chronic pain syndrome - Continue percocet 5-325mg  daily - Continue tizanidine 4mg  tid prn   Best Practice: Diet: Cardiac diet and Diabetic diet IVF: Fluids: None, Rate: None VTE: Xarelto  Code: Partial - CPR OK, DNI AB: None Status: Observation with expected length of stay less than 2 midnights. Anticipated Discharge Location: Home Barriers to Discharge: Medical stability  Signature: Harvie Heck, MD Internal Medicine Resident, PGY-3 [facility]  Pager: [phone] 2:31 PM, 11/27/2020   Please contact the on call pager after 5 pm and on weekends at [phone].

## 2020-11-27 NOTE — ED Triage Notes (Signed)
Pt bib [facility] c/o non radiating cp starting 5pm yesterday that has been worsening throughout the night. Pt describes the pain as pressure 9/10. Hx of MI - last one was 3 months ago. 4 aspirin and 1 ntiro given PTA. Vomiting episode x1.

## 2020-11-27 NOTE — Evaluation (Signed)
Physical Therapy Evaluation Patient Details Name: Mckenzie Taylor MRN: [id number] DOB: [birth year] Today's Date: 11/27/2020  History of Present Illness  Pt presented to ED on 9/15 with chest pain. Pt with acute on chronic heart failure. PMH - chf, copd, ckd, DM, HTN  Clinical Impression  Pt admitted with above diagnosis and presents to PT with functional limitations due to deficits listed below (See PT problem list). Pt needs skilled PT to maximize independence and safety to allow discharge to home when medically ready.         Recommendations for follow up therapy are one component of a multi-disciplinary discharge planning process, led by the attending physician.  Recommendations may be updated based on patient status, additional functional criteria and insurance authorization.  Follow Up Recommendations Home health PT    Equipment Recommendations  None recommended by PT    Recommendations for Other Services       Precautions / Restrictions Precautions Precautions: Fall      Mobility  Bed Mobility Overal bed mobility: Needs Assistance Bed Mobility: Supine to Sit     Supine to sit: Min assist     General bed mobility comments: Assist to elevate trunk into sitting    Transfers Overall transfer level: Needs assistance Equipment used: None Transfers: Sit to/from Stand Sit to Stand: Min guard         General transfer comment: Assist for safety  Ambulation/Gait Ambulation/Gait assistance: Min guard Gait Distance (Feet): 120 Feet Assistive device: None Gait Pattern/deviations: Step-through pattern;Decreased stride length   Gait velocity interpretation: <1.31 ft/sec, indicative of household Conservation officer, historic buildings Rankin (Stroke Patients Only)       Balance Overall balance assessment: Needs assistance Sitting-balance support: No upper extremity supported;Feet supported Sitting balance-Leahy Scale: Good      Standing balance support: No upper extremity supported;During functional activity Standing balance-Leahy Scale: Fair                               Pertinent Vitals/Pain Pain Assessment: No/denies pain    Home Living Family/patient expects to be discharged to:: Private residence Living Arrangements: Other relatives (cousin) Available Help at Discharge: Family;Available 24 hours/day Type of Home: House Home Access: Level entry     Home Layout: One level Home Equipment: Walker - 2 wheels;Wheelchair - manual;Cane - single point;Shower seat - built in;Other (comment);Hand held shower head;Bedside commode      Prior Function Level of Independence: Independent with assistive device(s)         Comments: Pt reports that she is mod-independent with ADLs, however requires SUPERVISION for shower transfers. At baseline, pt performs functional mobility of short household distances without AD, but endorses that she fatigues quickly during mobility of short household distances. Pt denies falls. Pt can prepare simple meals, but family assists with other IADLs     Hand Dominance   Dominant Hand: Right    Extremity/Trunk Assessment   Upper Extremity Assessment Upper Extremity Assessment: Overall WFL for tasks assessed    Lower Extremity Assessment Lower Extremity Assessment: Generalized weakness    Cervical / Trunk Assessment Cervical / Trunk Assessment: Kyphotic  Communication   Communication: No difficulties  Cognition Arousal/Alertness: Awake/alert Behavior During Therapy: WFL for tasks assessed/performed Overall Cognitive Status: Within Functional Limits for tasks assessed  General Comments      Exercises     Assessment/Plan    PT Assessment Patient needs continued PT services  PT Problem List         PT Treatment Interventions DME instruction;Functional mobility training;Balance  training;Patient/family education;Gait training;Therapeutic activities;Therapeutic exercise    PT Goals (Current goals can be found in the Care Plan section)  Acute Rehab PT Goals Patient Stated Goal: get well PT Goal Formulation: With patient Time For Goal Achievement: 12/04/20 Potential to Achieve Goals: Good    Frequency Min 3X/week   Barriers to discharge        Co-evaluation               AM-PAC PT "6 Clicks" Mobility  Outcome Measure Help needed turning from your back to your side while in a flat bed without using bedrails?: None Help needed moving from lying on your back to sitting on the side of a flat bed without using bedrails?: A Little Help needed moving to and from a bed to a chair (including a wheelchair)?: A Little Help needed standing up from a chair using your arms (e.g., wheelchair or bedside chair)?: A Little Help needed to walk in hospital room?: A Little Help needed climbing 3-5 steps with a railing? : A Little 6 Click Score: 19    End of Session   Activity Tolerance: Patient limited by fatigue Patient left: in bed;with call bell/phone within reach (sitting edge of stretcher) Nurse Communication: Mobility status PT Visit Diagnosis: Muscle weakness (generalized) (M62.81);Other abnormalities of gait and mobility (R26.89)    Time: 5110-2111 PT Time Calculation (min) (ACUTE ONLY): 13 min   Charges:   PT Evaluation $PT Eval Moderate Complexity: [place] Pager [phone] Office [place] 11/27/2020, 4:42 PM

## 2020-11-27 NOTE — ED Notes (Signed)
Pt working with physical therapy, ambulating in hallway with oxygen in place

## 2020-11-27 NOTE — ED Notes (Signed)
Patient ambulated to the bathroom, assisted back to bed. Call light within reach.

## 2020-11-28 DIAGNOSIS — I5023 Acute on chronic systolic (congestive) heart failure: Secondary | ICD-10-CM | POA: Diagnosis not present

## 2020-11-28 LAB — GLUCOSE, CAPILLARY
Glucose-Capillary: 121 mg/dL — ABNORMAL HIGH (ref 70–99)
Glucose-Capillary: 188 mg/dL — ABNORMAL HIGH (ref 70–99)
Glucose-Capillary: 150 mg/dL — ABNORMAL HIGH (ref 70–99)
Glucose-Capillary: 147 mg/dL — ABNORMAL HIGH (ref 70–99)

## 2020-11-28 LAB — CBC
HCT: 35.8 % — ABNORMAL LOW (ref 36.0–46.0)
Hemoglobin: 10.3 g/dL — ABNORMAL LOW (ref 12.0–15.0)
MCH: 27.1 pg (ref 26.0–34.0)
MCHC: 28.8 g/dL — ABNORMAL LOW (ref 30.0–36.0)
MCV: 94.2 fL (ref 80.0–100.0)
Platelets: 322 10*3/uL (ref 150–400)
RBC: 3.8 MIL/uL — ABNORMAL LOW (ref 3.87–5.11)
RDW: 17.1 % — ABNORMAL HIGH (ref 11.5–15.5)
WBC: 9.3 10*3/uL (ref 4.0–10.5)
nRBC: 0 % (ref 0.0–0.2)

## 2020-11-28 LAB — BASIC METABOLIC PANEL
Anion gap: 11 (ref 5–15)
BUN: 17 mg/dL (ref 8–23)
CO2: 31 mmol/L (ref 22–32)
Calcium: 10 mg/dL (ref 8.9–10.3)
Chloride: 98 mmol/L (ref 98–111)
Creatinine, Ser: 2.42 mg/dL — ABNORMAL HIGH (ref 0.44–1.00)
GFR, Estimated: 20 mL/min — ABNORMAL LOW (ref >60)
Glucose, Bld: 170 mg/dL — ABNORMAL HIGH (ref 70–99)
Potassium: 4.5 mmol/L (ref 3.5–5.1)
Sodium: 140 mmol/L (ref 135–145)

## 2020-11-28 MED ORDER — RAMELTEON 8 MG PO TABS
8.0000 mg | ORAL_TABLET | Freq: Once | ORAL | Status: AC
  Administered 2020-11-28: 8 mg via ORAL
  Filled 2020-11-28: qty 1

## 2020-11-28 MED ORDER — DEXTROMETHORPHAN POLISTIREX ER 30 MG/5ML PO SUER
15.0000 mg | Freq: Two times a day (BID) | ORAL | Status: DC | PRN
  Filled 2020-11-28: qty 5

## 2020-11-28 NOTE — Progress Notes (Signed)
HD#0 SUBJECTIVE:  Patient Summary: Mckenzie Taylor is a 80 y.o. with a pertinent PMH of coronary artery disease with recent admission one month prior for NSTEMI, COPD on 2-3L at baseline, CKD IV, anemia of chronic disease, type 2 diabetes mellitus, HFrEF (EF 25-30%), who presented with chest pain and admitted for HFrEF, but then found to be COVID +.   Overnight Events: patient laying in bed, states she feels tired   OBJECTIVE:  Vital Signs: Vitals:   11/28/20 0851 11/28/20 1220 11/28/20 1632 11/28/20 1634  BP:  100/69 (!) 97/55 92/60  Pulse: 96 80 86 87  Resp: 18 18    Temp:  97.8 F (36.6 C) 98.2 F (36.8 C)   TempSrc:  Oral Oral   SpO2: 95% 100% (!) 84% (!) 87%  Weight:      Height:       Supplemental O2: Nasal Cannula SpO2: (!) 87 % O2 Flow Rate (L/min): 3 L/min  Filed Weights   11/27/20 0637 11/27/20 0748 11/28/20 0405  Weight: 89.8 kg 89.8 kg 88.5 kg     Intake/Output Summary (Last 24 hours) at 11/28/2020 1708 Last data filed at 11/28/2020 0900 Gross per 24 hour  Intake 240 ml  Output 400 ml  Net -160 ml   Net IO Since Admission: -160 mL [11/28/20 1708]  Physical Exam: Constitutional: well-developed, well-nourished, obese HENT: NCAT, nasal cannula in place Eyes: No scleral icterus, conjunctiva clear Cardiovascular: no murmurs, rubs, or gallops  Pulm: CTAB, normal pulmonary effort GI: no tenderness, bowel sounds present MSK: normal bulk and tone, trace pitting edema to mid-tibia in bilateral lower extremities Skin:warm and dry Psych: normal mood and affect  Patient Lines/Drains/Airways Status     Active Line/Drains/Airways     Name Placement date Placement time Site Days   Peripheral IV 11/27/20 20 G Left Forearm 11/27/20  0648  Forearm  1            Pertinent Labs: CBC Latest Ref Rng & Units 11/28/2020 11/27/2020 10/20/2020  WBC 4.0 - 10.5 K/uL 9.3 9.1 9.1  Hemoglobin 12.0 - 15.0 g/dL 10.3(L) 10.2(L) 8.6(L)  Hematocrit 36.0 - 46.0 % 35.8(L)  35.1(L) 28.3(L)  Platelets 150 - 400 K/uL 322 304 247    CMP Latest Ref Rng & Units 11/28/2020 11/27/2020 10/20/2020  Glucose 70 - 99 mg/dL 170(H) 169(H) 91  BUN 8 - 23 mg/dL 17 12 25(H)  Creatinine 0.44 - 1.00 mg/dL 2.42(H) 1.95(H) 2.39(H)  Sodium 135 - 145 mmol/L 140 139 139  Potassium 3.5 - 5.1 mmol/L 4.5 4.3 4.2  Chloride 98 - 111 mmol/L 98 101 104  CO2 22 - 32 mmol/L 31 26 31   Calcium 8.9 - 10.3 mg/dL 10.0 10.6(H) 8.7(L)  Total Protein 6.5 - 8.1 g/dL - 6.3(L) -  Total Bilirubin 0.3 - 1.2 mg/dL - 0.7 -  Alkaline Phos 38 - 126 U/L - 45 -  AST 15 - 41 U/L - 12(L) -  ALT 0 - 44 U/L - 12 -    Recent Labs    11/28/20 0551 11/28/20 1215 11/28/20 1628  GLUCAP 121* 188* 150*     Pertinent Imaging: CT CHEST WO CONTRAST  Result Date: 11/27/2020 CLINICAL DATA:  Abnormal chest radiograph, demonstrating right middle lobe consolidation. Recent history of chest pain for 3 days. EXAM: CT CHEST WITHOUT CONTRAST TECHNIQUE: Multidetector CT imaging of the chest was performed following the standard protocol without IV contrast. COMPARISON:  Plain film of earlier today.  Chest CT of 01/05/2008.  FINDINGS: Cardiovascular: Aortic atherosclerosis. Mild cardiomegaly with trace pericardial fluid or thickening, new. Left main and 3 vessel coronary artery calcification. Mediastinum/Nodes: Left-sided thyroid enlargement and similar extension into the upper chest, likely due to goiter. No mediastinal or definite hilar adenopathy, given limitations of unenhanced CT. Tiny hiatal hernia. Lungs/Pleura: No pleural fluid. Patent airways, including to the right middle lobe. Mild centrilobular emphysema. Right middle lobe, and less so lingular volume loss and scarring, progressive compared to 2009. No underlying central obstructive lesion or evidence of superimposed infection/inflammation. Posterior left upper lobe 4 mm nodule on 27/6, present in 2009 and considered benign. A left lower lobe 5 mm nodule on 89/6 is likely  new since the prior. Mild left base scarring. Upper Abdomen: Mild hepatic steatosis. Normal imaged portions of the spleen, pancreas, right adrenal gland. Low-density left adrenal lesion measures 3.5 cm and is mildly enlarged from 3.3 cm in 2009. Multiple upper pole left renal low-density lesions are incompletely imaged but likely cysts. Musculoskeletal: No acute osseous abnormality. IMPRESSION: 1. Right middle lobe and less so lingular volume loss, likely post infectious/inflammatory scarring. Slightly progressive compared to 2009. No evidence of pneumonia. 2. Aortic atherosclerosis (ICD10-I70.0), coronary artery atherosclerosis and emphysema (ICD10-J43.9). 3. Left lower lobe 5 mm pulmonary nodule, new since the prior. No follow-up needed if patient is low-risk. Non-contrast chest CT can be considered in 12 months if patient is high-risk. This recommendation follows the consensus statement: Guidelines for Management of Incidental Pulmonary Nodules Detected on CT Images: From the Fleischner Society 2017; Radiology 2017; 284:228-243. 4. Mild hepatic steatosis. 5. Low-density left adrenal nodule is similar back to 2009 and can be presumed a benign adenoma. 6. 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 Electronically Signed   By: Abigail Miyamoto M.D.   On: 11/27/2020 18:12    ASSESSMENT/PLAN:  Assessment: Principal Problem:   Acute on chronic HFrEF (heart failure with reduced ejection fraction) (HCC) Active Problems:   Coronary atherosclerosis of native coronary artery   COPD with acute exacerbation (HCC)   CAP (community acquired pneumonia)   Type II diabetes mellitus with renal manifestations ([place])   CKD (chronic kidney disease), stage IV ([place])  Mckenzie Taylor is a 80 y.o. with a pertinent PMH of coronary artery disease with recent admission one month prior for NSTEMI, COPD on 2-3L at baseline, CKD IV, anemia of chronic disease, type 2 diabetes mellitus, HFrEF (EF 25-30%), who presented with chest pain and  admitted for HFrEF, but then found to be COVID + on hospital day 2.   Plan: COVID-19 infection Patient presented yesterday with chest pain. ECG, troponin not concerning for ACS. Originally thought to be 2/2 acute on chronic heart failure. However, given patient is euvolemic and weight is down from dry weight, most likely symptoms are 2/2 COVID-19. She is currently on her home oxygen requirement of 3-4L supplemental O2. Therefore will hold off initiating steroids or antivirals. Currently appears generally deconditioned due to the infection. We will continue with supportive care, patient worked with PT and OT today with recommendations for home health. Expect discharge in next day or so pending PT/OT recommendations. - Supportive care - Incentive spirometry - Delsym BID PRN - PT/OT recommend home health  2. Chronic systolic heart failure Yesterday patient was given one dose IV lasix 40mg  in ED due to concerns for acute on chronic heart failure. This morning, patient appears euvolemic on home O2 requirement. Renal function is also at baseline. At this juncture, do not believe patient would benefit from further diuresis.  Will continue with home GDMT. Will avoid ACE/ARB, SGLT2i, spironolactone with CKDIV. She is on metolazone 2.5mg  PRN at home, will continue to hold this as well. - Continue home carvedilol, Bidil  - Daily BMP - Strict I/O - Daily weights  3. Insulin Dependent type 2 diabetes CBG on presentation 140's.  Today has been 150's - Levemir 32U nightly +SSI - Carb modified diet  Best Practice: Diet: Cardiac diet IVF: Fluids: none VTE: heparin injection 5,000 Units Start: 11/27/20 2200 Code: DNI AB: none Family Contact: [name], attempted to reach him by phone DISPO: Anticipated discharge tomorrow to Home   Signature: Christiana Fuchs, D.O. Internal Medicine Resident, PGY-1 [facility]  Pager: [phone] 5:08 PM, 11/28/2020   Please contact the on call  pager after 5 pm and on weekends at [phone].

## 2020-11-28 NOTE — Evaluation (Signed)
Occupational Therapy Evaluation Patient Details Name: Mckenzie Taylor MRN: [id number] DOB: 1940-06-02 Today's Date: 11/28/2020   History of Present Illness Pt presented to ED on 9/15 with chest pain. Pt with acute on chronic heart failure. PMH - chf, copd, ckd, DM, HTN   Clinical Impression   Pt admitted with the above diagnoses and presents with below problem list. Pt will benefit from continued acute OT to address the below listed deficits and maximize independence with basic ADLs prior to d/c home. At baseline, pt is mod I with most ADLs, supervision for tub transfer; lives with her cousin. Pt completed bed mobility and sat EOB for remainder of session but declined standing/OOB activity d/t feeling poorly. Per chart review, conversation with pt, and clinical judgement, pt needs up to min guard assist with LB ADLs and functional transfers/mobility. Anticipate pt progress with rehab goals to be directly impacted by the trajectory of her symptoms. Next session plan to discuss energy conservation strategies for managing ADLs.        Recommendations for follow up therapy are one component of a multi-disciplinary discharge planning process, led by the attending physician.  Recommendations may be updated based on patient status, additional functional criteria and insurance authorization.   Follow Up Recommendations  Home health OT;Supervision - Intermittent    Equipment Recommendations  None recommended by OT    Recommendations for Other Services       Precautions / Restrictions Precautions Precautions: Fall      Mobility Bed Mobility Overal bed mobility: Needs Assistance Bed Mobility: Sidelying to Sit   Sidelying to sit: Supervision       General bed mobility comments: Pt used bed rail to facilitate trunk elevation from sidelying to EOB.    Transfers                 General transfer comment: pt declined d/t feeling poorly    Balance Overall balance assessment:  Needs assistance Sitting-balance support: No upper extremity supported;Feet supported Sitting balance-Leahy Scale: Good Sitting balance - Comments: sitting EOB to eat lunch                                   ADL either performed or assessed with clinical judgement   ADL Overall ADL's : Needs assistance/impaired Eating/Feeding: Set up;Sitting Eating/Feeding Details (indicate cue type and reason): sitting EOB to eat lunch Grooming: Set up;Sitting   Upper Body Bathing: Set up;Sitting   Lower Body Bathing: Min guard;Sit to/from stand   Upper Body Dressing : Set up;Sitting   Lower Body Dressing: Min guard;Sit to/from stand   Toilet Transfer: Min guard;Ambulation;RW   Toileting- Water quality scientist and Hygiene: Min guard;Sitting/lateral lean;Sit to/from stand   Tub/ Banker: Min guard;Tub transfer;Ambulation;Shower seat;Rolling walker   Functional mobility during ADLs: Min guard;Rolling walker General ADL Comments: Pt declined standing/OOB activity due to feeling poorly and reports. Pt reports she has been able to walk to/from the bathroom, some OOB functional acitivty this morning     Vision         Perception     Praxis      Pertinent Vitals/Pain Pain Assessment: No/denies pain     Hand Dominance Right   Extremity/Trunk Assessment Upper Extremity Assessment Upper Extremity Assessment: Overall WFL for tasks assessed;Generalized weakness   Lower Extremity Assessment Lower Extremity Assessment: Defer to PT evaluation   Cervical / Trunk Assessment Cervical / Trunk Assessment: Kyphotic  Communication Communication Communication: No difficulties   Cognition Arousal/Alertness: Awake/alert Behavior During Therapy: WFL for tasks assessed/performed Overall Cognitive Status: Within Functional Limits for tasks assessed                                     General Comments  On 3L O2 (baseline) throuhgout session with SpO2 >94     Exercises     Shoulder Instructions      Home Living Family/patient expects to be discharged to:: Private residence Living Arrangements: Other relatives Available Help at Discharge: Family;Available 24 hours/day Type of Home: House Home Access: Level entry     Home Layout: One level     Bathroom Shower/Tub: Teacher, early years/pre: Standard     Home Equipment: Environmental consultant - 2 wheels;Wheelchair - manual;Cane - single point;Shower seat - built in;Other (comment);Hand held shower head;Bedside commode          Prior Functioning/Environment Level of Independence: Independent with assistive device(s);Needs assistance        Comments: Pt reports that she is mod-independent with ADLs, however requires SUPERVISION for shower transfers. At baseline, pt performs functional mobility of short household distances without AD, but endorses that she fatigues quickly during mobility of short household distances. Pt denies falls. Pt can prepare simple meals, but family assists with other IADLs        OT Problem List: Decreased strength;Decreased activity tolerance;Impaired balance (sitting and/or standing);Decreased knowledge of use of DME or AE;Decreased knowledge of precautions;Cardiopulmonary status limiting activity      OT Treatment/Interventions: Self-care/ADL training;Therapeutic exercise;Energy conservation;DME and/or AE instruction;Therapeutic activities;Patient/family education;Balance training    OT Goals(Current goals can be found in the care plan section) Acute Rehab OT Goals Patient Stated Goal: get well OT Goal Formulation: With patient Time For Goal Achievement: 12/12/20 Potential to Achieve Goals: Good ADL Goals Pt Will Perform Grooming: with modified independence;standing Pt Will Perform Lower Body Bathing: with modified independence;sit to/from stand Pt Will Perform Lower Body Dressing: with modified independence;sit to/from stand Pt Will Perform Tub/Shower  Transfer: with supervision;Tub transfer;ambulating;shower seat;rolling walker  OT Frequency: Min 2X/week   Barriers to D/C:            Co-evaluation              AM-PAC OT "6 Clicks" Daily Activity     Outcome Measure Help from another person eating meals?: None Help from another person taking care of personal grooming?: None Help from another person toileting, which includes using toliet, bedpan, or urinal?: A Little Help from another person bathing (including washing, rinsing, drying)?: A Little Help from another person to put on and taking off regular upper body clothing?: None Help from another person to put on and taking off regular lower body clothing?: A Little 6 Click Score: 21   End of Session Equipment Utilized During Treatment: Oxygen (3L)  Activity Tolerance: Patient limited by fatigue;Other (comment) (verbalized feeling poorly) Patient left: with call bell/phone within reach;Other (comment) (sitting EOB eating lunch)  OT Visit Diagnosis: Unsteadiness on feet (R26.81);Muscle weakness (generalized) (M62.81)                Time: 1220-1240 OT Time Calculation (min): 20 min Charges:  OT General Charges $OT Visit: 1 Visit OT Evaluation $OT Eval Low Complexity: [place], OT Acute Rehabilitation Services Pager: [phone] Office: [phone]   Hortencia Pilar 11/28/2020, 12:50 PM

## 2020-11-28 NOTE — TOC Progression Note (Signed)
Transition of Care [facility]) - Progression Note    Patient Details  Name: Mckenzie Taylor MRN: [id number] Date of Birth: 1940-12-13  [facility]) CM/SW Contact  Zenon Mayo, RN Phone Number: 11/28/2020, 4:07 PM  Clinical Narrative:    NCM spoke with patient, she has home oxygen with [employer]  3 liters. She lives with spouse.  She has walker, w/chair and cane at home.  NCM offered choice for HHPT/HHOT, she states she does not have a preference.  NCM made referral to [facility] with [facility], she is able to take referral with soc being 24 to 48 hrs post dc. Patient states she will have transportation at dc.   Expected Discharge Plan: [place] Barriers to Discharge: Continued Medical Work up  Expected Discharge Plan and Services Expected Discharge Plan: [place]   Discharge Planning Services: CM Consult Post Acute Care Choice: [place] arrangements for the past 2 months: Single Family Home                           HH Arranged: OT, PT HH Agency: [facility] Date [place]: 11/28/20 Time [place]: [place] Representative spoke with at [place]: [place] ([place]) Interventions    Readmission Risk Interventions Readmission Risk Prevention Plan 10/18/2020  Transportation Screening Complete  Medication Review Press photographer) Complete  PCP or Specialist appointment within 3-5 days of discharge Complete  HRI or [place] Complete  SW Recovery Care/Counseling Consult Complete  [place] Not Applicable  Some recent data might be hidden

## 2020-11-28 NOTE — Care Management Obs Status (Signed)
[place] NOTIFICATION   Patient Details  Name: Mckenzie Taylor MRN: [id number] Date of Birth: 1940/05/20   Medicare Observation Status Notification Given:  Yes    Zenon Mayo, RN 11/28/2020, 5:02 PM

## 2020-11-29 DIAGNOSIS — N184 Chronic kidney disease, stage 4 (severe): Secondary | ICD-10-CM | POA: Diagnosis present

## 2020-11-29 DIAGNOSIS — I2510 Atherosclerotic heart disease of native coronary artery without angina pectoris: Secondary | ICD-10-CM | POA: Diagnosis present

## 2020-11-29 DIAGNOSIS — I5023 Acute on chronic systolic (congestive) heart failure: Secondary | ICD-10-CM | POA: Diagnosis not present

## 2020-11-29 DIAGNOSIS — Z91048 Other nonmedicinal substance allergy status: Secondary | ICD-10-CM | POA: Diagnosis not present

## 2020-11-29 DIAGNOSIS — I509 Heart failure, unspecified: Secondary | ICD-10-CM | POA: Diagnosis present

## 2020-11-29 DIAGNOSIS — U071 COVID-19: Secondary | ICD-10-CM | POA: Diagnosis present

## 2020-11-29 DIAGNOSIS — Z7982 Long term (current) use of aspirin: Secondary | ICD-10-CM | POA: Diagnosis not present

## 2020-11-29 DIAGNOSIS — Z79899 Other long term (current) drug therapy: Secondary | ICD-10-CM | POA: Diagnosis not present

## 2020-11-29 DIAGNOSIS — Z794 Long term (current) use of insulin: Secondary | ICD-10-CM | POA: Diagnosis not present

## 2020-11-29 DIAGNOSIS — Z20822 Contact with and (suspected) exposure to covid-19: Secondary | ICD-10-CM | POA: Diagnosis present

## 2020-11-29 DIAGNOSIS — E1122 Type 2 diabetes mellitus with diabetic chronic kidney disease: Secondary | ICD-10-CM | POA: Diagnosis present

## 2020-11-29 DIAGNOSIS — Z8701 Personal history of pneumonia (recurrent): Secondary | ICD-10-CM | POA: Diagnosis not present

## 2020-11-29 DIAGNOSIS — Z7951 Long term (current) use of inhaled steroids: Secondary | ICD-10-CM | POA: Diagnosis not present

## 2020-11-29 DIAGNOSIS — I252 Old myocardial infarction: Secondary | ICD-10-CM | POA: Diagnosis not present

## 2020-11-29 DIAGNOSIS — E785 Hyperlipidemia, unspecified: Secondary | ICD-10-CM | POA: Diagnosis present

## 2020-11-29 DIAGNOSIS — I5043 Acute on chronic combined systolic (congestive) and diastolic (congestive) heart failure: Secondary | ICD-10-CM | POA: Diagnosis present

## 2020-11-29 DIAGNOSIS — J1282 Pneumonia due to coronavirus disease 2019: Secondary | ICD-10-CM | POA: Diagnosis present

## 2020-11-29 DIAGNOSIS — D631 Anemia in chronic kidney disease: Secondary | ICD-10-CM | POA: Diagnosis present

## 2020-11-29 DIAGNOSIS — Z8249 Family history of ischemic heart disease and other diseases of the circulatory system: Secondary | ICD-10-CM | POA: Diagnosis not present

## 2020-11-29 DIAGNOSIS — Z825 Family history of asthma and other chronic lower respiratory diseases: Secondary | ICD-10-CM | POA: Diagnosis not present

## 2020-11-29 DIAGNOSIS — Z87891 Personal history of nicotine dependence: Secondary | ICD-10-CM | POA: Diagnosis not present

## 2020-11-29 DIAGNOSIS — I130 Hypertensive heart and chronic kidney disease with heart failure and stage 1 through stage 4 chronic kidney disease, or unspecified chronic kidney disease: Secondary | ICD-10-CM | POA: Diagnosis present

## 2020-11-29 DIAGNOSIS — Z7902 Long term (current) use of antithrombotics/antiplatelets: Secondary | ICD-10-CM | POA: Diagnosis not present

## 2020-11-29 DIAGNOSIS — K219 Gastro-esophageal reflux disease without esophagitis: Secondary | ICD-10-CM | POA: Diagnosis present

## 2020-11-29 DIAGNOSIS — J440 Chronic obstructive pulmonary disease with acute lower respiratory infection: Secondary | ICD-10-CM | POA: Diagnosis present

## 2020-11-29 DIAGNOSIS — E039 Hypothyroidism, unspecified: Secondary | ICD-10-CM | POA: Diagnosis present

## 2020-11-29 LAB — BASIC METABOLIC PANEL
Anion gap: 11 (ref 5–15)
BUN: 22 mg/dL (ref 8–23)
CO2: 27 mmol/L (ref 22–32)
Calcium: 8.5 mg/dL — ABNORMAL LOW (ref 8.9–10.3)
Chloride: 100 mmol/L (ref 98–111)
Creatinine, Ser: 2.9 mg/dL — ABNORMAL HIGH (ref 0.44–1.00)
GFR, Estimated: 16 mL/min — ABNORMAL LOW (ref >60)
Glucose, Bld: 145 mg/dL — ABNORMAL HIGH (ref 70–99)
Potassium: 4 mmol/L (ref 3.5–5.1)
Sodium: 138 mmol/L (ref 135–145)

## 2020-11-29 LAB — CBC
HCT: 30.3 % — ABNORMAL LOW (ref 36.0–46.0)
Hemoglobin: 9 g/dL — ABNORMAL LOW (ref 12.0–15.0)
MCH: 27.3 pg (ref 26.0–34.0)
MCHC: 29.7 g/dL — ABNORMAL LOW (ref 30.0–36.0)
MCV: 91.8 fL (ref 80.0–100.0)
Platelets: 279 10*3/uL (ref 150–400)
RBC: 3.3 MIL/uL — ABNORMAL LOW (ref 3.87–5.11)
RDW: 16.9 % — ABNORMAL HIGH (ref 11.5–15.5)
WBC: 8.4 10*3/uL (ref 4.0–10.5)
nRBC: 0 % (ref 0.0–0.2)

## 2020-11-29 LAB — GLUCOSE, CAPILLARY
Glucose-Capillary: 110 mg/dL — ABNORMAL HIGH (ref 70–99)
Glucose-Capillary: 161 mg/dL — ABNORMAL HIGH (ref 70–99)
Glucose-Capillary: 201 mg/dL — ABNORMAL HIGH (ref 70–99)
Glucose-Capillary: 231 mg/dL — ABNORMAL HIGH (ref 70–99)
Glucose-Capillary: 205 mg/dL — ABNORMAL HIGH (ref 70–99)

## 2020-11-29 LAB — FIBRINOGEN: Fibrinogen: 443 mg/dL (ref 210–475)

## 2020-11-29 LAB — D-DIMER, QUANTITATIVE: D-Dimer, Quant: 0.77 ug/mL-FEU — ABNORMAL HIGH (ref 0.00–0.50)

## 2020-11-29 MED ORDER — DEXAMETHASONE SODIUM PHOSPHATE 10 MG/ML IJ SOLN
6.0000 mg | Freq: Every day | INTRAMUSCULAR | Status: DC
  Administered 2020-11-29 – 2020-11-30 (×2): 6 mg via INTRAVENOUS
  Filled 2020-11-29 (×2): qty 1

## 2020-11-29 NOTE — Progress Notes (Signed)
   Subjective:  HD2  Patient evaluated at bedside this AM. States that she feels weak, not quite back to her baseline functional status. She endorses some worsening troubles breathing today. She has mild cough as well. Patient also endorses decreased appetite.  Objective:  Vital signs in last 24 hours: Vitals:   11/28/20 2015 11/28/20 2102 11/29/20 0500 11/29/20 0622  BP: 124/72 109/68  107/64  Pulse: 92 88  91  Resp: 19 20  18   Temp: 98.3 F (36.8 C) 97.8 F (36.6 C)  98.3 F (36.8 C)  TempSrc: Oral Oral  Oral  SpO2: 98% 98%  97%  Weight:   88.1 kg   Height:       General: Chronically ill-appearing, no acute distress CV: Regular rate, rhythm. No murmurs appreciated Pulm: Mild increase work of breathing on supplemental O2. Decreased breath sounds at bases bilaterally. MSK: Normal bulk, tone. No pitting edema bilateral lower extremities. Neuro: Awake, alert, conversing appropriately. Psych: Normal mood, affect, speech.  Assessment/Plan:  Linsy Ehresman is 80yo person with chronic systolic heart failure (EF 25-30%), diffuse moderate coronary artery disease, COPD on home 2-3L supplemental oxygen, chronic kidney disease stage IV, type 2 diabetes mellitus admitted 9/15 with COVID-19 infection, now initiating steroids given mild worsening of respiratory status.  Principal Problem:   Acute on chronic HFrEF (heart failure with reduced ejection fraction) (HCC) Active Problems:   Coronary atherosclerosis of native coronary artery   COPD with acute exacerbation (HCC)   CAP (community acquired pneumonia)   Type II diabetes mellitus with renal manifestations ([place])   CKD (chronic kidney disease), stage IV ([place])  #COVID-19 infection This morning, patient is endorsing mild increase work of breathing from yesterday. She continues to have appropriate saturations on home supplemental oxygen. However, given her co-morbidities, believe would be more beneficial to initiate steroids today. She  also is endorsing some decreased appetite and generalized weakness. Will have her continue working with physical therapy for most appropriate and safe disposition plan. Expect appetite to improve as she recovers from the acute illness. - Initiate dexamethasone 6mg  daily - Daily inflammatory markers - Incentive spirometry - Delsym BID PRN - PT/OT  #Type II diabetes mellitus Sugars have been well-controlled thus far. She is on 42 units of long acting insulin at home, but is being controlled with 32 units here with decreased appetite. Expect we will need to increase dosage with initiation of steroids. Will continue to monitor throughout the day and make appropriate changes as needed. - Levemir 32 units, titrate up as needed - SSI  #Chronic systolic heart failure Patient continues to appear euvolemic, will continue with home GDMT. - Carvedilol 6.25mg  twice daily - Isosorbide-hydralazine three times daily  Prior to Admission Living Arrangement: Home Anticipated Discharge Location: Home per PT/OT Barriers to Discharge: medical management Dispo: Anticipated discharge in approximately 1-3 day(s).   Sanjuan Dame, MD 11/29/2020, 10:17 AM Pager: [phone] After 5pm on weekdays and 1pm on weekends: On Call pager [phone]

## 2020-11-30 LAB — CBC
HCT: 32.6 % — ABNORMAL LOW (ref 36.0–46.0)
Hemoglobin: 9.4 g/dL — ABNORMAL LOW (ref 12.0–15.0)
MCH: 26.6 pg (ref 26.0–34.0)
MCHC: 28.8 g/dL — ABNORMAL LOW (ref 30.0–36.0)
MCV: 92.1 fL (ref 80.0–100.0)
Platelets: 164 10*3/uL (ref 150–400)
RBC: 3.54 MIL/uL — ABNORMAL LOW (ref 3.87–5.11)
RDW: 16.4 % — ABNORMAL HIGH (ref 11.5–15.5)
WBC: 7.3 10*3/uL (ref 4.0–10.5)
nRBC: 0 % (ref 0.0–0.2)

## 2020-11-30 LAB — C-REACTIVE PROTEIN: CRP: 2.3 mg/dL — ABNORMAL HIGH (ref <1.0)

## 2020-11-30 LAB — FERRITIN: Ferritin: 15 ng/mL (ref 11–307)

## 2020-11-30 LAB — GLUCOSE, CAPILLARY
Glucose-Capillary: 166 mg/dL — ABNORMAL HIGH (ref 70–99)
Glucose-Capillary: 168 mg/dL — ABNORMAL HIGH (ref 70–99)

## 2020-11-30 LAB — LACTATE DEHYDROGENASE: LDH: 123 U/L (ref 98–192)

## 2020-11-30 MED ORDER — OXYCODONE-ACETAMINOPHEN 5-325 MG PO TABS: 1.0000 | ORAL_TABLET | Freq: Every day | ORAL | 0 refills | Status: AC

## 2020-11-30 NOTE — Progress Notes (Signed)
Patient discharging home. Vital signs stable st time of discharge as reflected in discharge summary. Discharge instructions given and verbal understanding returned. No questions at this time.

## 2020-11-30 NOTE — TOC Transition Note (Signed)
Transition of Care [facility]) - CM/SW Discharge Note   Patient Details  Name: SHALAMAR PLOURDE MRN: [id number] Date of Birth: 10-Jun-1940  Transition of Care [facility]) CM/SW Contact:  Maebelle Munroe, RN Phone Number: 11/30/2020, 5:46 PM   Clinical Narrative:  [facility] Team for discharge planning. Spoke to son [name] regarding recommendations for home health services. Services to be provided by [facility]. Spoke to pt via telephone she shares that she has established O2 at home. She will call spouse to bring portable tank for transport to home. She verbally agrees with treament plan. Will continue to monitor for further discharge planning needs.     Final next level of care: [place] Barriers to Discharge: No Barriers Identified   Patient Goals and CMS Choice Patient states their goals for this hospitalization and ongoing recovery are:: return home CMS Medicare.gov Compare Post Acute Care list provided to:: Patient Choice offered to / list presented to : Patient  Discharge Placement                       Discharge Plan and Services   Discharge Planning Services: CM Consult Post Acute Care Choice: Home Health                    HH Arranged: OT, PT [facility] Agency: [facility] Date [place]: 11/28/20 Time [place]: 0301 Representative spoke with at [place]: [place] ([place]) Interventions     Readmission Risk Interventions Readmission Risk Prevention Plan 10/18/2020  Transportation Screening Complete  Medication Review Press photographer) Complete  PCP or Specialist appointment within 3-5 days of discharge Complete  HRI or [place] Complete  SW Recovery Care/Counseling Consult Complete  [place] Not Applicable  Some recent data might be hidden

## 2020-11-30 NOTE — Progress Notes (Signed)
SATURATION QUALIFICATIONS: (This note is used to comply with regulatory documentation for home oxygen)  Patient Saturations on Room Air at Rest = 87%  Patient Saturations on Room Air while Ambulating = 85%  Patient Saturations on 3 Liters of oxygen while Ambulating = 93%  Please briefly explain why patient needs home oxygen: Patient is on 3L of O2 at home

## 2020-11-30 NOTE — Progress Notes (Signed)
Patient called out reporting chest pain 5 out of 10. Obtained EKG, administered nitro. Patient stated pain, level is a 2 and decreasing. MD is aware.

## 2020-11-30 NOTE — Discharge Summary (Signed)
Name: Mckenzie Taylor MRN: [id number] DOB: 1940/06/10 80 y.o. PCP: Denton Lank, MD  Date of Admission: 11/27/2020  6:33 AM Date of Discharge: 11/30/2020 Attending Physician: Aldine Contes, MD  Discharge Diagnosis: 1. COVID-19 infection 2. Type II diabetes  3. Chronic Systolic Heart Failure  Discharge Medications: Allergies as of 11/30/2020       Reactions   Other Rash   Pt reports allergy to metals. Patient also reports allergic to "ice" and it makes her skin swell where it touches.  She states she can drink water with no issues.        Medication List     STOP taking these medications    predniSONE 10 MG (21) Tbpk tablet Commonly known as: STERAPRED UNI-PAK 21 TAB       TAKE these medications    albuterol 108 (90 Base) MCG/ACT inhaler Commonly known as: VENTOLIN HFA Inhale 2 puffs into the lungs every 4 (four) hours as needed for wheezing or shortness of breath.   amLODipine 10 MG tablet Commonly known as: NORVASC Take 10 mg by mouth at bedtime.   aspirin 81 MG EC tablet Take 1 tablet (81 mg total) by mouth daily.   budesonide 0.25 MG/2ML nebulizer solution Commonly known as: PULMICORT Take 2 mLs (0.25 mg total) by nebulization 2 (two) times daily.   busPIRone 5 MG tablet Commonly known as: BUSPAR Take 5 mg by mouth 2 (two) times daily.   carvedilol 6.25 MG tablet Commonly known as: COREG Take 1 tablet (6.25 mg total) by mouth 2 (two) times daily with a meal.   clopidogrel 75 MG tablet Commonly known as: PLAVIX Take 1 tablet (75 mg total) by mouth daily.   Cyanocobalamin 1500 MCG Tbdp Take 4,500 mcg by mouth daily.   Fluticasone-Umeclidin-Vilant 100-62.5-25 MCG/INH Aepb Inhale 1 puff into the lungs daily.   insulin detemir 100 UNIT/ML FlexPen Commonly known as: LEVEMIR Inject 42 Units into the skin at bedtime.   insulin lispro 100 UNIT/ML KwikPen Commonly known as: HUMALOG Inject 0-22 Units into the skin 3 (three) times daily with  meals. Sliding scale   iron polysaccharides 150 MG capsule Commonly known as: NIFEREX Take 1 capsule (150 mg total) by mouth daily.   isosorbide-hydrALAZINE 20-37.5 MG tablet Commonly known as: BIDIL Take 0.5 tablets by mouth 3 (three) times daily.   metolazone 2.5 MG tablet Commonly known as: ZAROXOLYN Take 1 tablet (2.5 mg total) by mouth daily as needed (for weight gain > 3 pounds in 1 day).   nitroGLYCERIN 0.4 MG SL tablet Commonly known as: NITROSTAT Place 1 tablet (0.4 mg total) under the tongue every 5 (five) minutes as needed for chest pain.   omeprazole 20 MG capsule Commonly known as: PRILOSEC Take 20 mg by mouth daily.   oxyCODONE-acetaminophen 5-325 MG tablet Commonly known as: PERCOCET/ROXICET Take 1 tablet by mouth at bedtime.   promethazine 25 MG tablet Commonly known as: PHENERGAN Take 25 mg by mouth every 6 (six) hours as needed for nausea.   rosuvastatin 10 MG tablet Commonly known as: CRESTOR Take 10 mg by mouth daily.   tiZANidine 4 MG tablet Commonly known as: ZANAFLEX Take 4 mg by mouth 3 (three) times daily as needed for muscle spasms.   traZODone 50 MG tablet Commonly known as: DESYREL Take 50 mg by mouth at bedtime.   Trulicity 1.5 WC/5.8NI Sopn Generic drug: Dulaglutide Inject 1.5 mg into the skin every Thursday.   Vitamin D3 125 MCG (5000 UT) Caps Take 5,000  Units by mouth daily.        Disposition and follow-up:   Ms.Mckenzie Taylor was discharged from [facility] in Stable condition.  At the hospital follow up visit please address:  1.  Patient was admitted for shortness of breath and weakness concerning for possible acute on chronic heart failure, then found to be COVID positive.  She remained in 3L, which is her home oxygen.  Was given two days of dexamethasone 6 mg, but this was discontinued because patient was able to ambulate on home oxygen and maintain O2 saturations.  She is being discharged with home  health physical and occupational therapy.  2.  Labs / imaging needed at time of follow-up: CBC, BMP  3.  Pending labs/ test needing follow-up: LDH, fibrinogen, Ferritin, D-dimer, CRP, CMP   Follow-up Appointments:  [place].   Specialty: Home Health Services Why: HHPT,HHOT Contact information: [address] [place] [address] [place] [facility] Course by problem list: 1. COVID-19 infection Patient presented due to substernal chest pain and shortness of breath.  ECG and troponin not concerning for ACS.  Originally thought to be due to Acute on Chronic heart failure.  However, given patient is euvolemic and weight is down from dry weight, most likely symptoms are secondary to COVID-19.  Initially held off on steroids because patient was on home supplemental O2 of 3-4 L.  Patient began to have shortness of breath the following day and steroids were initiated. She received two days of steroids with close monitoring in the setting of her diabetes.  She was able to ambulate while maintaining her oxygen saturations on her home supplemental O2 of 3 Liters.  Steroids were discontinued due to clinical improvement and patient was discharged home with home health PT and OT.   Discharge Exam:   BP (!) 142/73 (BP Location: Left Arm)   Pulse 87   Temp 98.4 F (36.9 C) (Oral)   Resp 18   Ht 5\' 5"  (1.651 m)   Wt 89.4 kg   SpO2 99%   BMI 32.80 kg/m  Discharge exam:  Constitutional: Well-developed, well-nourished HENT: NCAT, Nasal cannula in place Eyes: No scleral icterus, conjunctiva clear CV: no murmurs, rubs, or gallops Pulm: expiratory wheezing in the right lower lobe, normal work of breathing GI: No tenderness, bowel sounds present MSK: normal bulk and tone, no pitting edema in bilateral lower extremities Skin: Warm and dry Psych: normal mood and affect   Pertinent Labs, Studies,  and Procedures:   CBC Latest Ref Rng & Units 11/30/2020 11/29/2020 11/28/2020  WBC 4.0 - 10.5 K/uL 7.3 8.4 9.3  Hemoglobin 12.0 - 15.0 g/dL 9.4(L) 9.0(L) 10.3(L)  Hematocrit 36.0 - 46.0 % 32.6(L) 30.3(L) 35.8(L)  Platelets 150 - 400 K/uL 164 279 322    BMP Latest Ref Rng & Units 11/29/2020 11/28/2020 11/27/2020  Glucose 70 - 99 mg/dL 145(H) 170(H) 169(H)  BUN 8 - 23 mg/dL 22 17 12   Creatinine 0.44 - 1.00 mg/dL 2.90(H) 2.42(H) 1.95(H)  Sodium 135 - 145 mmol/L 138 140 139  Potassium 3.5 - 5.1 mmol/L 4.0 4.5 4.3  Chloride 98 - 111 mmol/L 100 98 101  CO2 22 - 32 mmol/L 27 31 26   Calcium 8.9 - 10.3 mg/dL 8.5(L) 10.0 10.6(H)     11/27/20 CT chest: IMPRESSION: 1.  Right middle lobe and less so lingular volume loss, likely post infectious/inflammatory scarring. Slightly progressive compared to 2009. No evidence of pneumonia. 2. Aortic atherosclerosis (ICD10-I70.0), coronary artery atherosclerosis and emphysema (ICD10-J43.9). 3. Left lower lobe 5 mm pulmonary nodule, new since the prior. No follow-up needed if patient is low-risk. Non-contrast chest CT can be considered in 12 months if patient is high-risk. This recommendation follows the consensus statement: Guidelines for Management of Incidental Pulmonary Nodules Detected on CT Images: From the Fleischner Society 2017; Radiology 2017; 284:228-243. 4. Mild hepatic steatosis. 5. Low-density left adrenal nodule is similar back to 2009 and can be presumed a benign adenoma. 6. 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  CRP= 2.3 mg/ dL D-dimer= 0.77 ug/mL   Discharge Instructions: Discharge Instructions     Diet - low sodium heart healthy   Complete by: As directed    Discharge instructions   Complete by: As directed    Ms. Kayleen Memos, You were admitted due to shortness of breath and feeling weak.  You were found to be COVID-positive, but you are able to stay on your home oxygen requirements.  In the hospital you received 2 days of steroids.  We  will discontinue steroids at this time due to your clinical improvement.  Please quarantine for an additional 3 days, until September 20th.  Thank you for letting us take part in your care.   Increase activity slowly   Complete by: As directed        Signed: Christiana Fuchs, DO 11/30/2020, 4:52 PM   Pager: [phone]

## 2020-11-30 NOTE — Progress Notes (Signed)
HD#1 SUBJECTIVE:  Patient Summary: Mckenzie Taylor is a 80 y.o. with a pertinent PMH of coronary artery disease with recent admission one month prior for NSTEMI, COPD on 2-3L at baseline, CKD IV, anemia of chronic disease, type 2 diabetes mellitus, HFrEF (EF 25-30%), who presented with chest pain and admitted for concern for acute on chronic HFrEF, but then found to be COVID positive.    Overnight Events: Patient resting in bed. She states that she feels betted than yesterday, but still gets short of breath with walking to the bathroom.  Remains on 3L O2.   OBJECTIVE:  Vital Signs: Vitals:   11/29/20 2012 11/30/20 0439 11/30/20 0600 11/30/20 0638  BP: 112/75 124/66 126/77 114/66  Pulse: 91 88    Resp: 20 20    Temp: 98.3 F (36.8 C) 97.8 F (36.6 C)    TempSrc: Oral Oral    SpO2: 99% 99%    Weight:  89.4 kg    Height:       Supplemental O2: Nasal Cannula SpO2: 99 % O2 Flow Rate (L/min): 3 L/min FiO2 (%): 32 %  Filed Weights   11/28/20 0405 11/29/20 0500 11/30/20 0439  Weight: 88.5 kg 88.1 kg 89.4 kg     Intake/Output Summary (Last 24 hours) at 11/30/2020 7846 Last data filed at 11/30/2020 0443 Gross per 24 hour  Intake 1200 ml  Output 1150 ml  Net 50 ml   Net IO Since Admission: -107 mL [11/30/20 0707]  Physical Exam: Constitutional: Well-developed, well-nourished HENT: NCAT, Nasal cannula in place Eyes: No scleral icterus, conjunctiva clear CV: no murmurs, rubs, or gallops Pulm: expiratory wheezing in the right lower lobe, normal work of breathing GI: No tenderness, bowel sounds present MSK: normal bulk and tone, no pitting edema in bilateral lower extremities Skin: Warm and dry Psych: normal mood and affect   Patient Lines/Drains/Airways Status     Active Line/Drains/Airways     Name Placement date Placement time Site Days   Peripheral IV 11/27/20 20 G Left Forearm 11/27/20  0648  Forearm  3            Pertinent Labs: CBC Latest Ref Rng &  Units 11/29/2020 11/28/2020 11/27/2020  WBC 4.0 - 10.5 K/uL 8.4 9.3 9.1  Hemoglobin 12.0 - 15.0 g/dL 9.0(L) 10.3(L) 10.2(L)  Hematocrit 36.0 - 46.0 % 30.3(L) 35.8(L) 35.1(L)  Platelets 150 - 400 K/uL 279 322 304    CMP Latest Ref Rng & Units 11/29/2020 11/28/2020 11/27/2020  Glucose 70 - 99 mg/dL 145(H) 170(H) 169(H)  BUN 8 - 23 mg/dL 22 17 12   Creatinine 0.44 - 1.00 mg/dL 2.90(H) 2.42(H) 1.95(H)  Sodium 135 - 145 mmol/L 138 140 139  Potassium 3.5 - 5.1 mmol/L 4.0 4.5 4.3  Chloride 98 - 111 mmol/L 100 98 101  CO2 22 - 32 mmol/L 27 31 26   Calcium 8.9 - 10.3 mg/dL 8.5(L) 10.0 10.6(H)  Total Protein 6.5 - 8.1 g/dL - - 6.3(L)  Total Bilirubin 0.3 - 1.2 mg/dL - - 0.7  Alkaline Phos 38 - 126 U/L - - 45  AST 15 - 41 U/L - - 12(L)  ALT 0 - 44 U/L - - 12    Recent Labs    11/29/20 1630 11/29/20 2122 11/30/20 0603  GLUCAP 231* 205* 166*     Pertinent Imaging: No results found.  ASSESSMENT/PLAN:  Assessment: Principal Problem:   Acute on chronic HFrEF (heart failure with reduced ejection fraction) (HCC) Active Problems:   Coronary atherosclerosis  of native coronary artery   COPD with acute exacerbation ([place])   CAP (community acquired pneumonia)   Type II diabetes mellitus with renal manifestations ([place])   CKD (chronic kidney disease), stage IV ([place])   COVID-19 virus infection  [name] is 80yo person with chronic systolic heart failure (EF 25-30%), diffuse moderate coronary artery disease, COPD on home 2-3L supplemental oxygen, chronic kidney disease stage IV, type 2 diabetes mellitus admitted 9/15 with COVID-19 infection, steroids initiated yesterday due to mild respiratory status worsening.    Plan: #COVID-19 infection Patient states that she feels less short of breath compared to yesterday.  Dexamethasone was started yesterday due to mild increase work of breathing.  She remains on home supplemental oxygen with appropriate saturations.  Patient worked with physical therapy  with recommendations for Home health. Will have patient work with nursing team to evaluate O2 saturations with ambulation.   -daily inflammatory markers -Continue dexamethasone 6 mg (day 2) -Delsym BID PRN  #Type II diabetes mellitus Sugars have been well-controlled thus far. She is on 42 units of long acting insulin at home, but is being controlled with 32 units here with decreased appetite. Expect we will need to increase dosage with initiation of steroids. Will continue to monitor throughout the day and make appropriate changes as needed. - Levemir 32 units, titrate up as needed - SSI   #Chronic systolic heart failure Patient continues to appear euvolemic, will continue with home GDMT. - Carvedilol 6.25mg  twice daily - Isosorbide-hydralazine three times daily    Best Practice: Diet: Cardiac diet IVF: Fluids: none VTE: heparin injection 5,000 Units Start: 11/27/20 2200 Code: DNI Therapy Recs: Home Health, DME: none Family Contact: [name], will update today DISPO: Anticipated discharge today or tomorrow to Home pending  ambulatory oxygen saturations .  Signature: Christiana Fuchs, D.O. Internal Medicine Resident, PGY-1 [facility]  Pager: [phone] 7:07 AM, 11/30/2020   Please contact the on call pager after 5 pm and on weekends at [phone].

## 2020-12-24 ENCOUNTER — Emergency Department: Payer: Medicare Other

## 2020-12-24 ENCOUNTER — Other Ambulatory Visit: Payer: Self-pay

## 2020-12-24 ENCOUNTER — Inpatient Hospital Stay
Admission: EM | Admit: 2020-12-24 | Discharge: 2020-12-29 | DRG: 246 | Disposition: A | Discharge status: Home / Self Care | Payer: Medicare Other | Attending: Internal Medicine | Admitting: Internal Medicine

## 2020-12-24 ENCOUNTER — Encounter: Payer: Self-pay | Admitting: Internal Medicine

## 2020-12-24 DIAGNOSIS — I252 Old myocardial infarction: Secondary | ICD-10-CM

## 2020-12-24 DIAGNOSIS — J449 Chronic obstructive pulmonary disease, unspecified: Secondary | ICD-10-CM | POA: Diagnosis not present

## 2020-12-24 DIAGNOSIS — I255 Ischemic cardiomyopathy: Secondary | ICD-10-CM | POA: Diagnosis not present

## 2020-12-24 DIAGNOSIS — E1165 Type 2 diabetes mellitus with hyperglycemia: Secondary | ICD-10-CM | POA: Diagnosis present

## 2020-12-24 DIAGNOSIS — Z96643 Presence of artificial hip joint, bilateral: Secondary | ICD-10-CM | POA: Diagnosis present

## 2020-12-24 DIAGNOSIS — I5043 Acute on chronic combined systolic (congestive) and diastolic (congestive) heart failure: Secondary | ICD-10-CM | POA: Diagnosis not present

## 2020-12-24 DIAGNOSIS — K219 Gastro-esophageal reflux disease without esophagitis: Secondary | ICD-10-CM | POA: Diagnosis present

## 2020-12-24 DIAGNOSIS — Z7951 Long term (current) use of inhaled steroids: Secondary | ICD-10-CM

## 2020-12-24 DIAGNOSIS — N184 Chronic kidney disease, stage 4 (severe): Secondary | ICD-10-CM | POA: Diagnosis not present

## 2020-12-24 DIAGNOSIS — R54 Age-related physical debility: Secondary | ICD-10-CM | POA: Diagnosis present

## 2020-12-24 DIAGNOSIS — E038 Other specified hypothyroidism: Secondary | ICD-10-CM | POA: Diagnosis present

## 2020-12-24 DIAGNOSIS — E119 Type 2 diabetes mellitus without complications: Secondary | ICD-10-CM

## 2020-12-24 DIAGNOSIS — Z794 Long term (current) use of insulin: Secondary | ICD-10-CM

## 2020-12-24 DIAGNOSIS — I200 Unstable angina: Secondary | ICD-10-CM

## 2020-12-24 DIAGNOSIS — E785 Hyperlipidemia, unspecified: Secondary | ICD-10-CM | POA: Diagnosis present

## 2020-12-24 DIAGNOSIS — D631 Anemia in chronic kidney disease: Secondary | ICD-10-CM | POA: Diagnosis present

## 2020-12-24 DIAGNOSIS — K30 Functional dyspepsia: Secondary | ICD-10-CM | POA: Diagnosis present

## 2020-12-24 DIAGNOSIS — I10 Essential (primary) hypertension: Secondary | ICD-10-CM | POA: Diagnosis not present

## 2020-12-24 DIAGNOSIS — I2582 Chronic total occlusion of coronary artery: Secondary | ICD-10-CM | POA: Diagnosis present

## 2020-12-24 DIAGNOSIS — Z9981 Dependence on supplemental oxygen: Secondary | ICD-10-CM

## 2020-12-24 DIAGNOSIS — N179 Acute kidney failure, unspecified: Secondary | ICD-10-CM | POA: Diagnosis not present

## 2020-12-24 DIAGNOSIS — I2510 Atherosclerotic heart disease of native coronary artery without angina pectoris: Secondary | ICD-10-CM | POA: Diagnosis present

## 2020-12-24 DIAGNOSIS — E039 Hypothyroidism, unspecified: Secondary | ICD-10-CM | POA: Diagnosis not present

## 2020-12-24 DIAGNOSIS — R079 Chest pain, unspecified: Secondary | ICD-10-CM | POA: Diagnosis present

## 2020-12-24 DIAGNOSIS — I509 Heart failure, unspecified: Secondary | ICD-10-CM

## 2020-12-24 DIAGNOSIS — Z825 Family history of asthma and other chronic lower respiratory diseases: Secondary | ICD-10-CM

## 2020-12-24 DIAGNOSIS — J9621 Acute and chronic respiratory failure with hypoxia: Secondary | ICD-10-CM | POA: Diagnosis present

## 2020-12-24 DIAGNOSIS — J441 Chronic obstructive pulmonary disease with (acute) exacerbation: Secondary | ICD-10-CM | POA: Diagnosis not present

## 2020-12-24 DIAGNOSIS — E875 Hyperkalemia: Secondary | ICD-10-CM | POA: Diagnosis present

## 2020-12-24 DIAGNOSIS — I25118 Atherosclerotic heart disease of native coronary artery with other forms of angina pectoris: Secondary | ICD-10-CM | POA: Diagnosis not present

## 2020-12-24 DIAGNOSIS — I130 Hypertensive heart and chronic kidney disease with heart failure and stage 1 through stage 4 chronic kidney disease, or unspecified chronic kidney disease: Secondary | ICD-10-CM | POA: Diagnosis present

## 2020-12-24 DIAGNOSIS — Z79899 Other long term (current) drug therapy: Secondary | ICD-10-CM

## 2020-12-24 DIAGNOSIS — I25110 Atherosclerotic heart disease of native coronary artery with unstable angina pectoris: Principal | ICD-10-CM | POA: Diagnosis present

## 2020-12-24 DIAGNOSIS — Z801 Family history of malignant neoplasm of trachea, bronchus and lung: Secondary | ICD-10-CM

## 2020-12-24 DIAGNOSIS — E1122 Type 2 diabetes mellitus with diabetic chronic kidney disease: Secondary | ICD-10-CM | POA: Diagnosis present

## 2020-12-24 DIAGNOSIS — Z20822 Contact with and (suspected) exposure to covid-19: Secondary | ICD-10-CM | POA: Diagnosis present

## 2020-12-24 DIAGNOSIS — I2584 Coronary atherosclerosis due to calcified coronary lesion: Secondary | ICD-10-CM | POA: Diagnosis present

## 2020-12-24 DIAGNOSIS — T508X5A Adverse effect of diagnostic agents, initial encounter: Secondary | ICD-10-CM | POA: Diagnosis not present

## 2020-12-24 DIAGNOSIS — E538 Deficiency of other specified B group vitamins: Secondary | ICD-10-CM | POA: Diagnosis present

## 2020-12-24 DIAGNOSIS — Z87891 Personal history of nicotine dependence: Secondary | ICD-10-CM

## 2020-12-24 DIAGNOSIS — Z8249 Family history of ischemic heart disease and other diseases of the circulatory system: Secondary | ICD-10-CM

## 2020-12-24 DIAGNOSIS — Z7902 Long term (current) use of antithrombotics/antiplatelets: Secondary | ICD-10-CM

## 2020-12-24 DIAGNOSIS — N1411 Contrast-induced nephropathy: Secondary | ICD-10-CM | POA: Diagnosis not present

## 2020-12-24 DIAGNOSIS — I5021 Acute systolic (congestive) heart failure: Secondary | ICD-10-CM | POA: Diagnosis not present

## 2020-12-24 DIAGNOSIS — Z8616 Personal history of COVID-19: Secondary | ICD-10-CM

## 2020-12-24 DIAGNOSIS — Z7984 Long term (current) use of oral hypoglycemic drugs: Secondary | ICD-10-CM

## 2020-12-24 DIAGNOSIS — I5023 Acute on chronic systolic (congestive) heart failure: Secondary | ICD-10-CM

## 2020-12-24 DIAGNOSIS — Z7982 Long term (current) use of aspirin: Secondary | ICD-10-CM

## 2020-12-24 LAB — RESP PANEL BY RT-PCR (FLU A&B, COVID) ARPGX2
Influenza A by PCR: NEGATIVE
Influenza B by PCR: NEGATIVE
SARS Coronavirus 2 by RT PCR: NEGATIVE

## 2020-12-24 LAB — BASIC METABOLIC PANEL
Anion gap: 10 (ref 5–15)
BUN: 27 mg/dL — ABNORMAL HIGH (ref 8–23)
CO2: 27 mmol/L (ref 22–32)
Calcium: 10.8 mg/dL — ABNORMAL HIGH (ref 8.9–10.3)
Chloride: 103 mmol/L (ref 98–111)
Creatinine, Ser: 2.12 mg/dL — ABNORMAL HIGH (ref 0.44–1.00)
GFR, Estimated: 23 mL/min — ABNORMAL LOW (ref >60)
Glucose, Bld: 181 mg/dL — ABNORMAL HIGH (ref 70–99)
Potassium: 4.1 mmol/L (ref 3.5–5.1)
Sodium: 140 mmol/L (ref 135–145)

## 2020-12-24 LAB — CBG MONITORING, ED: Glucose-Capillary: 195 mg/dL — ABNORMAL HIGH (ref 70–99)

## 2020-12-24 LAB — CBC
HCT: 32.7 % — ABNORMAL LOW (ref 36.0–46.0)
Hemoglobin: 10.2 g/dL — ABNORMAL LOW (ref 12.0–15.0)
MCH: 27.9 pg (ref 26.0–34.0)
MCHC: 31.2 g/dL (ref 30.0–36.0)
MCV: 89.3 fL (ref 80.0–100.0)
Platelets: 361 10*3/uL (ref 150–400)
RBC: 3.66 MIL/uL — ABNORMAL LOW (ref 3.87–5.11)
RDW: 16.8 % — ABNORMAL HIGH (ref 11.5–15.5)
WBC: 8.5 10*3/uL (ref 4.0–10.5)
nRBC: 0 % (ref 0.0–0.2)

## 2020-12-24 LAB — TROPONIN I (HIGH SENSITIVITY)
Troponin I (High Sensitivity): 48 ng/L — ABNORMAL HIGH (ref <18)
Troponin I (High Sensitivity): 57 ng/L — ABNORMAL HIGH (ref <18)

## 2020-12-24 LAB — BRAIN NATRIURETIC PEPTIDE: B Natriuretic Peptide: 944.1 pg/mL — ABNORMAL HIGH (ref 0.0–100.0)

## 2020-12-24 LAB — PROCALCITONIN: Procalcitonin: 0.22 ng/mL

## 2020-12-24 MED ORDER — CLOPIDOGREL BISULFATE 75 MG PO TABS
75.0000 mg | ORAL_TABLET | Freq: Every day | ORAL | Status: DC
  Administered 2020-12-24 – 2020-12-29 (×6): 75 mg via ORAL
  Filled 2020-12-24 (×6): qty 1

## 2020-12-24 MED ORDER — FUROSEMIDE 10 MG/ML IJ SOLN
20.0000 mg | Freq: Once | INTRAMUSCULAR | Status: AC
  Administered 2020-12-24: 20 mg via INTRAVENOUS
  Filled 2020-12-24: qty 4

## 2020-12-24 MED ORDER — UMECLIDINIUM-VILANTEROL 62.5-25 MCG/INH IN AEPB
1.0000 | INHALATION_SPRAY | Freq: Every day | RESPIRATORY_TRACT | Status: DC
  Administered 2020-12-25 – 2020-12-29 (×5): 1 via RESPIRATORY_TRACT
  Filled 2020-12-24: qty 14

## 2020-12-24 MED ORDER — NITROGLYCERIN 0.4 MG SL SUBL: 0.4000 mg | SUBLINGUAL_TABLET | SUBLINGUAL | Status: DC | PRN

## 2020-12-24 MED ORDER — ROSUVASTATIN CALCIUM 10 MG PO TABS
10.0000 mg | ORAL_TABLET | Freq: Every day | ORAL | Status: DC
  Administered 2020-12-24 – 2020-12-25 (×2): 10 mg via ORAL
  Filled 2020-12-24 (×4): qty 1

## 2020-12-24 MED ORDER — ASPIRIN EC 81 MG PO TBEC
81.0000 mg | DELAYED_RELEASE_TABLET | Freq: Every day | ORAL | Status: DC
  Administered 2020-12-25 – 2020-12-29 (×5): 81 mg via ORAL
  Filled 2020-12-24 (×5): qty 1

## 2020-12-24 MED ORDER — ACETAMINOPHEN 325 MG PO TABS
650.0000 mg | ORAL_TABLET | ORAL | Status: DC | PRN
  Filled 2020-12-24: qty 2

## 2020-12-24 MED ORDER — CARVEDILOL 6.25 MG PO TABS
6.2500 mg | ORAL_TABLET | Freq: Two times a day (BID) | ORAL | Status: DC
  Administered 2020-12-25 – 2020-12-29 (×8): 6.25 mg via ORAL
  Filled 2020-12-24 (×9): qty 1

## 2020-12-24 MED ORDER — ONDANSETRON HCL 4 MG/2ML IJ SOLN
4.0000 mg | Freq: Four times a day (QID) | INTRAMUSCULAR | Status: DC | PRN
  Administered 2020-12-26: 4 mg via INTRAVENOUS
  Filled 2020-12-24: qty 2

## 2020-12-24 MED ORDER — NITROGLYCERIN 0.4 MG SL SUBL
0.4000 mg | SUBLINGUAL_TABLET | SUBLINGUAL | Status: DC | PRN
  Administered 2020-12-24 – 2020-12-28 (×5): 0.4 mg via SUBLINGUAL
  Filled 2020-12-24 (×4): qty 1

## 2020-12-24 MED ORDER — ASPIRIN 81 MG PO CHEW
324.0000 mg | CHEWABLE_TABLET | Freq: Once | ORAL | Status: AC
  Administered 2020-12-24: 324 mg via ORAL
  Filled 2020-12-24: qty 4

## 2020-12-24 MED ORDER — HEPARIN SODIUM (PORCINE) 5000 UNIT/ML IJ SOLN
5000.0000 [IU] | Freq: Three times a day (TID) | INTRAMUSCULAR | Status: DC
  Administered 2020-12-24 – 2020-12-29 (×12): 5000 [IU] via SUBCUTANEOUS
  Filled 2020-12-24 (×12): qty 1

## 2020-12-24 MED ORDER — IPRATROPIUM-ALBUTEROL 0.5-2.5 (3) MG/3ML IN SOLN
3.0000 mL | Freq: Once | RESPIRATORY_TRACT | Status: AC
  Administered 2020-12-24: 3 mL via RESPIRATORY_TRACT
  Filled 2020-12-24: qty 3

## 2020-12-24 MED ORDER — INSULIN ASPART 100 UNIT/ML IJ SOLN
0.0000 [IU] | Freq: Three times a day (TID) | INTRAMUSCULAR | Status: DC
  Administered 2020-12-25: 1 [IU] via SUBCUTANEOUS
  Administered 2020-12-25 – 2020-12-26 (×2): 2 [IU] via SUBCUTANEOUS
  Administered 2020-12-27 (×2): 1 [IU] via SUBCUTANEOUS
  Administered 2020-12-27: 2 [IU] via SUBCUTANEOUS
  Administered 2020-12-28 (×2): 1 [IU] via SUBCUTANEOUS
  Administered 2020-12-28 – 2020-12-29 (×2): 2 [IU] via SUBCUTANEOUS
  Administered 2020-12-29: 3 [IU] via SUBCUTANEOUS
  Filled 2020-12-24 (×12): qty 1

## 2020-12-24 MED ORDER — NITROGLYCERIN 2 % TD OINT
0.5000 [in_us] | TOPICAL_OINTMENT | TRANSDERMAL | Status: AC
  Administered 2020-12-24: 0.5 [in_us] via TOPICAL
  Filled 2020-12-24: qty 1

## 2020-12-24 MED ORDER — TRAZODONE HCL 50 MG PO TABS
50.0000 mg | ORAL_TABLET | Freq: Every day | ORAL | Status: DC
  Administered 2020-12-24 – 2020-12-28 (×5): 50 mg via ORAL
  Filled 2020-12-24 (×5): qty 1

## 2020-12-24 MED ORDER — FLUTICASONE-UMECLIDIN-VILANT 100-62.5-25 MCG/INH IN AEPB: 1.0000 | INHALATION_SPRAY | Freq: Every day | RESPIRATORY_TRACT | Status: DC

## 2020-12-24 MED ORDER — BUDESONIDE 0.25 MG/2ML IN SUSP
0.2500 mg | Freq: Two times a day (BID) | RESPIRATORY_TRACT | Status: DC
  Administered 2020-12-25 – 2020-12-29 (×8): 0.25 mg via RESPIRATORY_TRACT
  Filled 2020-12-24 (×9): qty 2

## 2020-12-24 NOTE — ED Notes (Signed)
ED TO INPATIENT HANDOFF REPORT  ED Nurse Name and Phone #: [phone]  S Name/Age/Gender Mckenzie Taylor 80 y.o. female Room/Bed: ED08A/ED08A  Code Status   Code Status: Full Code  Home/SNF/Other Home Patient oriented to: self, place, time, and situation Is this baseline? Yes   Triage Complete: Triage complete  Chief Complaint Chest pain [R07.9]  Triage Note Pt to ED [facility] from home for intermittent centralized cp radiating to both arms. +shob with nausea for 2 days.  NAD noted Cardiac hx Ran out of nitro 2 days ago.   Nitro x2 PTA 324 ASA PTA 20 G Left AC  Wears 3L [place] at home chronic.    Allergies Allergies  Allergen Reactions   Other Rash    Pt reports allergy to metals. Patient also reports allergic to "ice" and it makes her skin swell where it touches.  She states she can drink water with no issues.    Level of Care/Admitting Diagnosis ED Disposition     ED Disposition  Admit   Condition  --   [place]: [place] [[id number]]  Level of Care: Progressive Cardiac [106]  Admit to Progressive based on following criteria: CARDIOVASCULAR & THORACIC of moderate stability with acute coronary syndrome symptoms/low risk myocardial infarction/hypertensive urgency/arrhythmias/heart failure potentially compromising stability and stable post cardiovascular intervention patients.  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 Estimates quit ~ 2015   GERD (gastroesophageal reflux disease)    HTN (hypertension)    Hyperlipidemia with target LDL less than 70    Hypothyroidism    subclinical. low TSH, normal thyroid panel. biopsy 2010   Vitamin B12 deficiency    Past Surgical History:  Procedure Laterality Date   CHOLECYSTECTOMY     hip replacement     bilateral-secondary to avascular necrosis   LEFT HEART CATH AND CORONARY ANGIOGRAPHY N/A 10/17/2020   Procedure: LEFT HEART CATH AND CORONARY ANGIOGRAPHY;  Surgeon: Corey Skains, MD;  Location: [facility];  Service: Cardiovascular;  Laterality: N/A;   right eye lens replacement     ULNAR NERVE REPAIR     bilateral   VESICOVAGINAL FISTULA CLOSURE W/ TAH       A IV Location/Drains/Wounds Patient Lines/Drains/Airways Status     Active Line/Drains/Airways     Name Placement date Placement time Site Days   Peripheral IV 12/24/20 20 G Left Antecubital 12/24/20  --  Antecubital  less than 1            Intake/Output Last 24 hours No intake or output data in the 24 hours ending 12/24/20 2146  Labs/Imaging Results for orders placed or performed during the hospital encounter of 12/24/20 (from the past 48 hour(s))  Basic metabolic panel     Status: Abnormal   Collection Time: 12/24/20  3:19 PM  Result Value Ref Range   Sodium 140 135 - 145 mmol/L   Potassium 4.1 3.5 - 5.1 mmol/L   Chloride 103 98 - 111 mmol/L   CO2 27 22 - 32 mmol/L   Glucose, Bld 181 (H) 70 - 99 mg/dL    Comment: Glucose reference range applies only to  samples taken after fasting for at least 8 hours.   BUN 27 (H) 8 - 23 mg/dL   Creatinine, Ser 2.12 (H) 0.44 - 1.00 mg/dL   Calcium 10.8 (H) 8.9 - 10.3 mg/dL   GFR, Estimated 23 (L) >60 mL/min    Comment: (NOTE) Calculated using the CKD-EPI Creatinine Equation (2021)    Anion gap 10 5 - 15    Comment: Performed at [facility], [address] [place], [place] [address]  CBC     Status: Abnormal   Collection Time: 12/24/20  3:19 PM  Result Value Ref Range   WBC 8.5 4.0 - 10.5 K/uL   RBC 3.66 (L) 3.87 - 5.11 MIL/uL   Hemoglobin 10.2 (L) 12.0 - 15.0 g/dL   HCT 32.7 (L) 36.0 - 46.0 %   MCV 89.3 80.0 - 100.0 fL   MCH 27.9 26.0 - 34.0 pg   MCHC 31.2 30.0 - 36.0 g/dL   RDW 16.8 (H) 11.5 - 15.5 %   Platelets 361 150 - 400 K/uL   nRBC 0.0 0.0 - 0.2 %    Comment: Performed at [facility], [place], [place] [address]  Troponin I (High Sensitivity)     Status: Abnormal   Collection Time: 12/24/20  3:19 PM  Result Value Ref Range   Troponin I (High Sensitivity) 48 (H) <18 ng/L    Comment: (NOTE) Elevated high sensitivity troponin I (hsTnI) values and significant  changes across serial measurements may suggest ACS but many other  chronic and acute conditions are known to elevate hsTnI results.  Refer to the "Links" section for chest pain algorithms and additional  guidance. Performed at [facility], [address] [place], [place] [address]   Brain natriuretic peptide     Status: Abnormal   Collection Time: 12/24/20  3:19 PM  Result Value Ref Range   B Natriuretic Peptide 944.1 (H) 0.0 - 100.0 pg/mL    Comment: Performed at [facility], [address] [place], [place] [address]  Procalcitonin - Baseline     Status: None   Collection Time: 12/24/20  5:21 PM  Result Value Ref Range   Procalcitonin 0.22 ng/mL    Comment:        Interpretation: PCT (Procalcitonin) <= 0.5 ng/mL: Systemic infection (sepsis) is not likely. Local  bacterial infection is possible. (NOTE)       Sepsis PCT Algorithm           Lower Respiratory Tract                                      Infection PCT Algorithm    ----------------------------     ----------------------------  PCT < 0.25 ng/mL                PCT < 0.10 ng/mL          Strongly encourage             Strongly discourage   discontinuation of antibiotics    initiation of antibiotics    ----------------------------     -----------------------------       PCT 0.25 - 0.50 ng/mL            PCT 0.10 - 0.25 ng/mL               OR       >80% decrease in PCT            Discourage initiation of                                            antibiotics      Encourage discontinuation           of antibiotics    ----------------------------     -----------------------------         PCT >= 0.50 ng/mL              PCT 0.26 - 0.50 ng/mL               AND        <80% decrease in PCT             Encourage initiation of                                             antibiotics       Encourage continuation           of antibiotics    ----------------------------     -----------------------------        PCT >= 0.50 ng/mL                  PCT > 0.50 ng/mL               AND         increase in PCT                  Strongly encourage                                      initiation of antibiotics    Strongly encourage escalation           of antibiotics                                     -----------------------------                                           PCT <= 0.25 ng/mL  OR                                        > 80% decrease in PCT                                      Discontinue / Do not initiate                                             antibiotics  Performed at [facility], [place], [place] [address]   Troponin I (High Sensitivity)     Status: Abnormal   Collection Time: 12/24/20  5:30 PM  Result  Value Ref Range   Troponin I (High Sensitivity) 57 (H) <18 ng/L    Comment: (NOTE) Elevated high sensitivity troponin I (hsTnI) values and significant  changes across serial measurements may suggest ACS but many other  chronic and acute conditions are known to elevate hsTnI results.  Refer to the "Links" section for chest pain algorithms and additional  guidance. Performed at [facility], [address] [place], [place] [address]   Resp Panel by RT-PCR (Flu A&B, Covid) Nasopharyngeal Swab     Status: None   Collection Time: 12/24/20  8:02 PM   Specimen: Nasopharyngeal Swab; Nasopharyngeal(NP) swabs in vial transport medium  Result Value Ref Range   SARS Coronavirus 2 by RT PCR NEGATIVE NEGATIVE    Comment: (NOTE) SARS-CoV-2 target nucleic acids are NOT DETECTED.  The SARS-CoV-2 RNA is generally detectable in upper respiratory specimens during the acute phase of infection. The lowest concentration of SARS-CoV-2 viral copies this assay can detect is 138 copies/mL. A negative result does not preclude SARS-Cov-2 infection and should not be used as the sole basis for treatment or other patient management decisions. A negative result may occur with  improper specimen collection/handling, submission of specimen other than nasopharyngeal swab, presence of viral mutation(s) within the areas targeted by this assay, and inadequate number of viral copies(<138 copies/mL). A negative result must be combined with clinical observations, patient history, and epidemiological information. The expected result is Negative.  Fact Sheet for Patients:  EntrepreneurPulse.com.au  Fact Sheet for Healthcare Providers:  IncredibleEmployment.be  This test is no t yet approved or cleared by the [place] FDA and  has been authorized for detection and/or diagnosis of SARS-CoV-2 by FDA under an Emergency Use Authorization (EUA). This EUA will remain  in  effect (meaning this test can be used) for the duration of the COVID-19 declaration under Section 564(b)(1) of the Act, 21 U.S.C.section 360bbb-3(b)(1), unless the authorization is terminated  or revoked sooner.       Influenza A by PCR NEGATIVE NEGATIVE   Influenza B by PCR NEGATIVE NEGATIVE    Comment: (NOTE) The Xpert Xpress SARS-CoV-2/FLU/RSV plus assay is intended as an aid in the diagnosis of influenza from Nasopharyngeal swab specimens and should not be used as a sole basis for treatment. Nasal washings and aspirates are unacceptable for Xpert Xpress SARS-CoV-2/FLU/RSV testing.  Fact Sheet for Patients: EntrepreneurPulse.com.au  Fact Sheet for Healthcare Providers: IncredibleEmployment.be  This test is not yet approved or cleared by the [place] FDA and has been  authorized for detection and/or diagnosis of SARS-CoV-2 by FDA under an Emergency Use Authorization (EUA). This EUA will remain in effect (meaning this test can be used) for the duration of the COVID-19 declaration under Section 564(b)(1) of the Act, 21 U.S.C. section 360bbb-3(b)(1), unless the authorization is terminated or revoked.  Performed at [facility], [address] [place], [place] [address]   CBG monitoring, ED     Status: Abnormal   Collection Time: 12/24/20  9:14 PM  Result Value Ref Range   Glucose-Capillary 195 (H) 70 - 99 mg/dL    Comment: Glucose reference range applies only to samples taken after fasting for at least 8 hours.   DG Chest 2 View  Result Date: 12/24/2020 CLINICAL DATA:  Chest pain EXAM: CHEST - 2 VIEW COMPARISON:  Chest x-ray 11/27/2020, CT chest 11/27/2020 FINDINGS: The heart and mediastinal contours are unchanged with persistent small pericardial effusion. Aortic calcification. Bilateral lower lobe streaky airspace opacities. No pulmonary edema. Likely trace bilateral pleural effusions. No pneumothorax. No acute osseous  abnormality. IMPRESSION: 1. Persistent likely small volume pericardial effusion. 2. Trace bilateral pleural effusions not excluded. 3. Bilateral lower lobe streaky airspace opacities. Findings may represent a combination of infection/invasion versus atelectasis. Electronically Signed   By: Iven Finn M.D.   On: 12/24/2020 17:25    Pending Labs [employer] (From admission, onward)     Start     Ordered   Signed and Held  Hemoglobin A1c  Once,   R       Comments: To assess prior glycemic control    Signed and Held            Vitals/Pain Today's Vitals   12/24/20 1900 12/24/20 1930 12/24/20 2000 12/24/20 2030  BP: (!) 121/96 124/81 124/80 (!) 143/78  Pulse: (!) 109 (!) 119 (!) 108 (!) 119  Resp: 18 20 20  (!) 21  Temp:      SpO2: 100% 100% 100% 98%  Weight:      Height:      PainSc:        Isolation Precautions No active isolations  Medications Medications  nitroGLYCERIN (NITROSTAT) SL tablet 0.4 mg (0.4 mg Sublingual Given 12/24/20 1733)  ipratropium-albuterol (DUONEB) 0.5-2.5 (3) MG/3ML nebulizer solution 3 mL (3 mLs Nebulization Given 12/24/20 1733)  aspirin chewable tablet 324 mg (324 mg Oral Given 12/24/20 1732)  furosemide (LASIX) injection 20 mg (20 mg Intravenous Given 12/24/20 1945)  nitroGLYCERIN (NITROGLYN) 2 % ointment 0.5 inch (0.5 inches Topical Given 12/24/20 1943)    Mobility walks Low fall risk   Focused Assessments Cardiac Assessment Handoff:  Cardiac Rhythm: Sinus tachycardia (HR 134. Pt coughing. EDP informed.) Lab Results  Component Value Date   TROPONINI <0.03 02/10/2018   Lab Results  Component Value Date   DDIMER 0.77 (H) 11/29/2020   Does the Patient currently have chest pain? Yes    R Recommendations: See Admitting Provider Note  Report given to:   Additional Notes: lives at home / 3lpm [place] chronic / can use BSC / has nitro paste to left chest

## 2020-12-24 NOTE — ED Notes (Signed)
MSE Jenise PA in triage

## 2020-12-24 NOTE — ED Triage Notes (Signed)
Pt to ED [facility] from home for intermittent centralized cp radiating to both arms. +shob with nausea for 2 days.  NAD noted Cardiac hx Ran out of nitro 2 days ago.   Nitro x2 PTA 324 ASA PTA 20 G Left AC  Wears 3L [place] at home chronic.

## 2020-12-24 NOTE — H&P (Signed)
History and Physical    Mckenzie Taylor:[id number] DOB: 09-13-1940 DOA: 12/24/2020  PCP: Mckenzie Lank, MD    Patient coming from:  Home    Chief Complaint:  Chest pain    HPI: Mckenzie Taylor is a 80 y.o. female seen in ed with complaints of chest pain, two days ago. She was out of her NTG.located in middle of chest travelling to both arm, and center of her back.  C/p 10/10 . Sharp indigestion and it burns as well.sob+/ nausea. She felt like she did when she had MI. They intermittent since 2 days. Pt lives with her cousin and has been intermittent.  Pt is sitting up as it makes chest pain and sob better. On [place] currently at 2 L [place].   Pt has past medical history of diabetes mellitus type 2, heart disease, COPD.  ED Course:  Vitals:   12/24/20 1900 12/24/20 1930 12/24/20 2000 12/24/20 2030  BP: (!) 121/96 124/81 124/80 (!) 143/78  Pulse: (!) 109 (!) 119 (!) 108 (!) 119  Resp: 18 20 20  (!) 21  Temp:      SpO2: 100% 100% 100% 98%  Weight:      Height:      In the emergency room patient is alert awake oriented gives a history. Patient received aspirin 324, Pulmicort, Lasix 20 mg IV, sublingual nitroglycerin and DuoNeb. Blood work shows glucose of 181 creatinine of 2.12 which is acute on chronic, calcium 10.8, hemoglobin of 10.2, BNP of 944.1, troponin of 48, with a repeat troponin of 57. Chart review shows patient has anemia since 2018 is said to be anemia of chronic disease in 2020 patient had a stool occult card that was guaiac positive I suspect that patient has a combination of iron deficiency anemia from chronic blood loss and bit patient is not aware of this history states that she did not know she is anemic but if she were to need a colonoscopy she would refuse colonoscopy.   Review of Systems:  Review of Systems  Constitutional: Negative.   HENT: Negative.    Respiratory:  Positive for shortness of breath.   Cardiovascular:  Positive for chest pain.   Gastrointestinal: Negative.   Neurological: Negative.   Psychiatric/Behavioral: Negative.      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 Estimates quit ~ 2015   GERD (gastroesophageal reflux disease)    HTN (hypertension)    Hyperlipidemia with target LDL less than 70    Hypothyroidism    subclinical. low TSH, normal thyroid panel. biopsy 2010   Vitamin B12 deficiency     Past Surgical History:  Procedure Laterality Date   CHOLECYSTECTOMY     hip replacement     bilateral-secondary to avascular necrosis   LEFT HEART CATH AND CORONARY ANGIOGRAPHY  N/A 10/17/2020   Procedure: LEFT HEART CATH AND CORONARY ANGIOGRAPHY;  Surgeon: Corey Skains, MD;  Location: [facility];  Service: Cardiovascular;  Laterality: N/A;   right eye lens replacement     ULNAR NERVE REPAIR     bilateral   VESICOVAGINAL FISTULA CLOSURE W/ TAH       reports that she has quit smoking. She has a 50.00 pack-year smoking history. She has never used smokeless tobacco. She reports that she does not drink alcohol and does not use drugs.  Allergies  Allergen Reactions   Other Rash    Pt reports allergy to metals. Patient also reports allergic to "ice" and it makes her skin swell where it touches.  She states she can drink water with no issues.    Family History  Problem Relation Age of Onset   Cancer Father        lung    COPD Mother    Heart disease Mother    Heart disease Maternal Uncle     Prior to Admission medications   Medication Sig Start Date End Date Taking? Authorizing Provider  albuterol (VENTOLIN HFA) 108 (90 Base) MCG/ACT inhaler Inhale 2 puffs into the lungs every 4 (four) hours as needed for wheezing or shortness of breath.   Yes [provider]  amLODipine (NORVASC) 10 MG tablet Take 10 mg by mouth at bedtime.   Yes [provider]  aspirin EC 81 MG EC tablet Take 1 tablet (81 mg total) by mouth daily. 11/11/18  Yes Gladstone Lighter, MD  budesonide (PULMICORT) 0.25 MG/2ML nebulizer solution Take 2 mLs (0.25 mg total) by nebulization 2 (two) times daily. 10/20/20  Yes Max Sane, MD  busPIRone (BUSPAR) 5 MG tablet Take 5 mg by mouth 2 (two) times daily.   Yes [provider]  carvedilol (COREG) 6.25 MG tablet Take 1 tablet (6.25 mg total) by mouth 2 (two) times daily with a meal. 11/11/18  Yes Gladstone Lighter, MD  clopidogrel (PLAVIX) 75 MG tablet Take 75 mg by mouth daily.   Yes [provider]  Cyanocobalamin 1500 MCG TBDP Take 4,500 mcg by mouth daily.   Yes [provider]  Fluticasone-Umeclidin-Vilant 100-62.5-25 MCG/INH AEPB Inhale 1 puff into the lungs daily.   Yes [provider]  insulin detemir (LEVEMIR) 100 UNIT/ML FlexPen Inject 42 Units into the skin at bedtime.   Yes [provider]  insulin lispro (HUMALOG) 100 UNIT/ML KwikPen Inject 0-22 Units into the skin 3 (three) times daily with meals. Sliding scale 10/26/18  Yes [provider]  nitroGLYCERIN (NITROSTAT) 0.4 MG SL tablet Place 1 tablet (0.4 mg total) under the tongue every 5 (five) minutes as needed for chest pain. 10/20/20  Yes Max Sane, MD  oxyCODONE-acetaminophen (PERCOCET/ROXICET) 5-325 MG tablet Take 1 tablet by mouth at bedtime. 11/30/20  Yes Masters, Katie, DO  promethazine (PHENERGAN) 25 MG tablet Take 25 mg by mouth every 6 (six) hours as needed  for nausea.    Yes [provider]  rosuvastatin (CRESTOR) 10 MG tablet Take 10 mg by mouth daily.   Yes [provider]  torsemide (DEMADEX) 20 MG tablet Take 20 mg by mouth daily.   Yes [provider]  traZODone (DESYREL) 50 MG tablet Take 50 mg by mouth at bedtime. 10/28/20  Yes [provider]  Cholecalciferol (VITAMIN D3) 5000 units CAPS Take 5,000 Units by mouth daily. Patient not taking: Reported on 12/24/2020    [provider]  Dulaglutide (TRULICITY) 1.5 ZS/0.1UX SOPN Inject 1.5 mg into the skin every Thursday.    [provider]  iron polysaccharides (NIFEREX) 150 MG capsule Take 1 capsule (150 mg total) by mouth daily. 10/20/20 11/19/20  Max Sane, MD  metolazone (ZAROXOLYN) 2.5 MG tablet Take 1 tablet (2.5 mg total) by mouth daily as needed (for weight gain > 3 pounds in 1 day). Patient not taking: Reported on 12/24/2020 11/11/18   Gladstone Lighter, MD  omeprazole (PRILOSEC) 20 MG capsule Take 20 mg by mouth daily. Patient not taking: Reported on 12/24/2020    [provider]  tiZANidine (ZANAFLEX) 4 MG tablet Take 4 mg by mouth 3 (three) times daily as needed for muscle spasms.     [provider]    Physical Exam: Vitals:   12/24/20 1900 12/24/20 1930 12/24/20 2000 12/24/20 2030  BP: (!) 121/96 124/81 124/80 (!) 143/78  Pulse: (!) 109 (!) 119 (!) 108 (!) 119  Resp: 18 20 20  (!) 21  Temp:      SpO2: 100% 100% 100% 98%  Weight:      Height:       Physical Exam Vitals and nursing note reviewed.  Constitutional:      General: She is awake. She is not in acute distress.    Appearance: She is obese. She is not ill-appearing.     Interventions: Nasal cannula in place.  HENT:     Head: Normocephalic and atraumatic.     Right Ear: External ear normal.     Left Ear: External ear normal.     Mouth/Throat:     Mouth: Mucous membranes are moist.  Eyes:     Extraocular Movements: Extraocular movements  intact.     Pupils: Pupils are equal, round, and reactive to light.  Neck:     Vascular: No carotid bruit.  Cardiovascular:     Rate and Rhythm: Normal rate and regular rhythm.     Pulses: Normal pulses.     Heart sounds: Normal heart sounds.  Pulmonary:     Effort: Pulmonary effort is normal.     Breath sounds: Normal breath sounds.  Abdominal:     General: Bowel sounds are normal. There is no distension.     Palpations: Abdomen is soft. There is no mass.     Tenderness: There is no abdominal tenderness. There is no guarding.     Hernia: No hernia is present.  Musculoskeletal:     Right lower leg: No edema.     Left lower leg: No edema.  Skin:    General: Skin is warm.  Neurological:     General: No focal deficit present.     Mental Status: She is alert and oriented to person, place, and time.  Psychiatric:        Mood and Affect: Mood normal.        Behavior: Behavior normal. Behavior is cooperative.     Labs on Admission: I have personally reviewed following labs and imaging studies  No results for input(s): CKTOTAL, CKMB, TROPONINI in the last 72 hours. Lab Results  Component Value Date   WBC 8.5 12/24/2020   HGB 10.2 (L) 12/24/2020   HCT 32.7 (L) 12/24/2020   MCV 89.3 12/24/2020   PLT 361 12/24/2020    Recent Labs  Lab 12/24/20 1519  NA 140  K 4.1  CL 103  CO2 27  BUN 27*  CREATININE 2.12*  CALCIUM 10.8*  GLUCOSE 181*   Lab Results  Component Value Date   CHOL 259 (H) 09/17/2020   HDL 43 09/17/2020   LDLCALC 142 (H) 09/17/2020   TRIG 370 (H) 09/17/2020   Lab Results  Component Value Date   DDIMER 0.77 (H) 11/29/2020   Invalid input(s): POCBNP  COVID-19 Labs No results for input(s): DDIMER, FERRITIN, LDH, CRP in the last 72 hours.  Lab Results  Component Value Date   SARSCOV2NAA NEGATIVE 12/24/2020   SARSCOV2NAA POSITIVE (A) 11/27/2020   [name] NEGATIVE 10/16/2020   [place] NEGATIVE 09/16/2020    Radiological Exams on  Admission: DG Chest 2 View  Result Date: 12/24/2020 CLINICAL DATA:  Chest pain EXAM: CHEST - 2 VIEW COMPARISON:  Chest x-ray 11/27/2020, CT chest 11/27/2020 FINDINGS: The heart and mediastinal contours are unchanged with persistent small pericardial effusion. Aortic calcification. Bilateral lower lobe streaky airspace opacities. No pulmonary edema. Likely trace bilateral pleural effusions. No pneumothorax. No acute osseous abnormality. IMPRESSION: 1. Persistent likely small volume pericardial effusion. 2. Trace bilateral pleural effusions not excluded. 3. Bilateral lower lobe streaky airspace opacities. Findings may represent a combination of infection/invasion versus atelectasis. Electronically Signed   By: Iven Finn M.D.   On: 12/24/2020 17:25    EKG: Independently reviewed.  Sinus tachycardia with no ST-wave changes, heart rate of 120, and QTC of 490.   Echocardiogram:    Assessment/Plan Principal Problem:   Chest pain Active Problems:   CAD (coronary artery disease)   GERD (gastroesophageal reflux disease)   HTN (hypertension)   Diabetes type 2, controlled (HCC)   CKD (chronic kidney disease), stage IV (HCC)   Hypothyroidism   Chest pain: D/D include ACS vs PE as pt is recovering from covid in September.  We will cycle troponin, admit to PCU with telemetry.  We will obtain echo and stress test. Cont asa and statin therapy with PRN NTG.  We will also obtain dimer / V/Q scan. We will cont asa and heparin dvt prophylaxis dose.   GERD: Iv ppi.  HTN: Blood pressure (!) 143/78, pulse (!) 119, temperature 98 F (36.7 C), resp. rate (!) 21, height 5\' 5"  (1.651 m), weight 88 kg, SpO2 98 %. Cont pt on coreg, and PRN lasix hold amlodipine to give room for diuresis.  DM II: Hold home insulin and trulicity. SSI/ Glycemic protocol. NPO after midnight.   AKI on CKD: Lab Results  Component Value Date   CREATININE 2.12 (H) 12/24/2020   CREATININE 2.90 (H) 11/29/2020    CREATININE 2.42 (H) 11/28/2020  We will renally dose needed medication. Avoid contrast study.   Hypothyroidism: No thyroid replacement in chart. Resume once med rec is available.  Ft4/tsh.   DVT prophylaxis:  Heparin    Code Status:  Full code ,verified with patient.    Family Communication:  Vincente Poli (Son)  [phone] [facility] Phone)   Disposition Plan:  Home    Consults called:   None   Admission status: Inpatient.     Para Skeans MD [facility] [phone] How to contact the [facility] Attending or Consulting provider [place] or covering provider during after hours [place], for this patient.    Check the care team in [facility] and look for a) attending/consulting [facility] provider listed and b) the [facility] team listed Log into [email or web address] and use [place]'s universal password to access. If you do not have the password, please contact the hospital operator. Locate the [facility] provider you are looking for under [facility] and page to a number that you can  be directly reached. If you still have difficulty reaching the provider, please page the [facility] (Director on Call) for the Hospitalists listed on amion for assistance. [email or web address] Password [email or web address] 12/24/2020, 10:08 PM

## 2020-12-24 NOTE — ED Notes (Signed)
Warm blankets provided.

## 2020-12-24 NOTE — ED Notes (Signed)
Pt coughing. Pt asking if she can eat. Explained first we need to stabilize breathing etc before pt can eat.

## 2020-12-24 NOTE — ED Notes (Signed)
Pt in room, c/o chest tightness. Ran out of PRN NTG since 3d and 81mg  aspirin once daily since 4d .  States her breathing has felt worse last few days and now chest feels tight. Pt wears 3L oxygen at baseline and has COPD, CHF, CAD, DM2.  Pt quit smoking 8 years ago, used to smoke 3 packs/day.

## 2020-12-24 NOTE — ED Notes (Signed)
Pt not in room yet. Labs were drawn in triage.

## 2020-12-24 NOTE — ED Notes (Signed)
Dr Jacqualine Code was at bedside. New orders being entered by him now.

## 2020-12-24 NOTE — ED Provider Notes (Signed)
Emergency Medicine Provider Triage Evaluation Note  Mckenzie Taylor, a 80 y.o. female  was evaluated in triage.  Pt complains of chest pain for several weeks. She is several month s/p AMI with reports of weekly use of nitro without significant relief. She presents today via EMS with consistent complaints. She denies SOB, diaphoresis, N/V.  Review of Systems  Positive: CP Negative: NV, SOB  Physical Exam  BP (!) 144/89 (BP Location: Left Arm)   Pulse (!) 121   Temp 98 F (36.7 C)   Resp 20   Ht 5\' 5"  (1.651 m)   Wt 88 kg   SpO2 93%   BMI 32.28 kg/m  Gen:   Awake, no distress  NAD Resp:  Normal effort CTA MSK:   Moves extremities without difficulty  Other:   CVS: Tachy rate  Medical Decision Making  Medically screening exam initiated at 3:41 PM.  Appropriate orders placed.  [name] was informed that the remainder of the evaluation will be completed by another provider, this initial triage assessment does not replace that evaluation, and the importance of remaining in the ED until their evaluation is complete.  Patient with history of AMI with several weeks c/o central chest pain not relieved with nitro.    Melvenia Needles, PA-C 12/24/20 1610    Delman Kitten, MD 12/24/20 [phone]

## 2020-12-24 NOTE — ED Provider Notes (Signed)
[facility] Emergency Department Provider Note   ____________________________________________   Event Date/Time   First MD Initiated Contact with Patient 12/24/20 1733     (approximate)  I have reviewed the triage vital signs and the nursing notes.   HISTORY  Chief Complaint Chest Pain    HPI Mckenzie Taylor is a 80 y.o. female history of coronary disease COPD CHF NSTEMI  Patient reports that since this morning she been experiencing a deep burning feeling in her chest.  She is had this previously to been heart related.  Couple months ago she had a small heart attack with similar.  She is also been feeling slightly short of breath for the last few days.  She notes that when she lays down her shortness of breath is quite a bit worse and its relieved by sitting up.  Currently having just a very light burning feeling in the chest much better than it was a few hours ago  Denies any cough.  Denies any fevers or chills.  Has not taken any aspirin or nitroglycerin today.  Uses torsemide at home.  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 Estimates quit ~ 2015   GERD (gastroesophageal reflux disease)    HTN (hypertension)    Hyperlipidemia with target LDL less than 70    Hypothyroidism    subclinical. low TSH, normal thyroid panel. 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/16/2020   CKD (chronic kidney disease), stage IV ([place]) 09/16/2020   Depression 09/16/2020   COPD with acute exacerbation ([place]) 08/14/2016   CAP (community acquired pneumonia) 08/14/2016   GERD (gastroesophageal reflux disease) 08/14/2016   Adrenal mass, left ([place]) 01/11/2012   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   Coronary atherosclerosis of native coronary artery 12/17/2008    Past Surgical History:  Procedure Laterality Date   CHOLECYSTECTOMY     hip replacement     bilateral-secondary to avascular necrosis   LEFT HEART CATH AND CORONARY ANGIOGRAPHY N/A 10/17/2020   Procedure: LEFT HEART CATH AND CORONARY ANGIOGRAPHY;  Surgeon: Corey Skains, MD;  Location: [facility];  Service: Cardiovascular;  Laterality: N/A;   right eye lens replacement     ULNAR NERVE REPAIR     bilateral   VESICOVAGINAL FISTULA CLOSURE W/ TAH      Prior to Admission medications  Medication Sig Start Date End Date Taking? Authorizing Provider  albuterol (VENTOLIN HFA) 108 (90 Base) MCG/ACT inhaler Inhale 2 puffs into the lungs every 4 (four) hours as needed for wheezing or shortness of breath.   Yes [provider]  amLODipine (NORVASC) 10 MG tablet Take 10 mg by mouth at bedtime.   Yes [provider]  aspirin EC 81 MG EC tablet Take 1 tablet (81 mg total) by mouth daily. 11/11/18  Yes [place] Lighter, MD  budesonide (PULMICORT) 0.25 MG/2ML nebulizer solution Take 2 mLs (0.25 mg total) by nebulization 2 (two) times daily. 10/20/20  Yes Max Sane,  MD  busPIRone (BUSPAR) 5 MG tablet Take 5 mg by mouth 2 (two) times daily.   Yes [provider]  carvedilol (COREG) 6.25 MG tablet Take 1 tablet (6.25 mg total) by mouth 2 (two) times daily with a meal. 11/11/18  Yes [place] Lighter, MD  Cyanocobalamin 1500 MCG TBDP Take 4,500 mcg by mouth daily.   Yes [provider]  Fluticasone-Umeclidin-Vilant 100-62.5-25 MCG/INH AEPB Inhale 1 puff into the lungs daily.   Yes [provider]  insulin detemir (LEVEMIR) 100 UNIT/ML FlexPen Inject 42 Units into the skin at bedtime.   Yes [provider]  insulin lispro (HUMALOG) 100 UNIT/ML KwikPen Inject 0-22 Units into the skin 3 (three) times daily with meals. Sliding scale 10/26/18  Yes [provider]  nitroGLYCERIN (NITROSTAT) 0.4 MG SL tablet Place 1 tablet (0.4 mg total) under the tongue every 5 (five) minutes as needed for chest pain. 10/20/20  Yes Max Sane, MD  oxyCODONE-acetaminophen (PERCOCET/ROXICET) 5-325 MG tablet Take 1 tablet by mouth at bedtime. 11/30/20  Yes Masters, Katie, DO  promethazine (PHENERGAN) 25 MG tablet Take 25 mg by mouth every 6 (six) hours as needed for nausea.    Yes [provider]  rosuvastatin (CRESTOR) 10 MG tablet Take 10 mg by mouth daily.   Yes [provider]  traZODone (DESYREL) 50 MG tablet Take 50 mg by mouth at bedtime. 10/28/20  Yes [provider]  Cholecalciferol (VITAMIN D3) 5000 units CAPS Take 5,000 Units by mouth daily. Patient not taking: Reported on 12/24/2020    [provider]  Dulaglutide (TRULICITY) 1.5 KG/4.0NU SOPN Inject 1.5 mg into the skin every Thursday.    [provider]  iron polysaccharides (NIFEREX) 150 MG capsule Take 1 capsule (150 mg total) by mouth daily. 10/20/20 11/19/20  Max Sane, MD  metolazone (ZAROXOLYN) 2.5 MG tablet Take 1 tablet (2.5 mg total) by mouth daily as needed (for weight gain > 3 pounds in 1 day). Patient not taking: Reported on  12/24/2020 11/11/18   [place] Lighter, MD  omeprazole (PRILOSEC) 20 MG capsule Take 20 mg by mouth daily. Patient not taking: Reported on 12/24/2020    [provider]  tiZANidine (ZANAFLEX) 4 MG tablet Take 4 mg by mouth 3 (three) times daily as needed for muscle spasms.     [provider]    Allergies Other  Family History  Problem Relation Age of Onset   Cancer Father        lung   COPD Mother    Heart disease Mother    Heart disease Maternal Uncle     Social History Social History   Tobacco Use   Smoking status: Former    Packs/day: 1.00    Years: 50.00    Pack years: 50.00    Types: Cigarettes   Smokeless tobacco: Never   Tobacco comments:  1 ppd - 50 years   Substance Use Topics   Alcohol use: No   Drug use: No    Review of Systems Constitutional: No fever/chills Eyes: No visual changes. ENT: No sore throat. Cardiovascular: See HPI Respiratory: See HPI gastrointestinal: No abdominal pain.   Genitourinary: Negative for dysuria. Musculoskeletal: Negative for back pain. Skin: Negative for rash. Neurological: Negative for headaches, areas of focal weakness or numbness.    ____________________________________________   PHYSICAL EXAM:  VITAL SIGNS: ED Triage Vitals  Enc Vitals Group     BP 12/24/20 1522 (!) 144/89     Pulse Rate 12/24/20 1522 (!) 121     Resp 12/24/20 1522 20     Temp 12/24/20 1522 98 F (36.7 C)     Temp src --      SpO2 12/24/20 1522 93 %     Weight 12/24/20 1519 194 lb (88 kg)     Height 12/24/20 1519 5\' 5"  (1.651 m)     Head Circumference --      Peak Flow --      Pain Score 12/24/20 1519 5     Pain Loc --      Pain Edu? --      Excl. in [place]? --     Constitutional: Alert and oriented. Well appearing and in no acute distress.  Sitting up at the side of the bed.  Appears just slightly tachypneic but in no distress Eyes: Conjunctivae are normal. Head: Atraumatic. Nose: No  congestion/rhinnorhea. Mouth/Throat: Mucous membranes are moist. Neck: No stridor.  Cardiovascular: Minimally tachycardic l rate, regular rhythm. Grossly normal heart sounds.  Good peripheral circulation. Respiratory: Normal respiratory effort except for slight tachypnea.  No retractions. Lungs CTAB upper lobes but very faint rales noted in the lower lobes bilateral. Gastrointestinal: Soft and nontender. No distention. Musculoskeletal: No lower extremity tenderness nor edema except may be some very trace edema in the feet bilateral. Neurologic:  Normal speech and language. No gross focal neurologic deficits are appreciated.  Skin:  Skin is warm, dry and intact. No rash noted. Psychiatric: Mood and affect are normal. Speech and behavior are normal.  ____________________________________________   LABS (all labs ordered are listed, but only abnormal results are displayed)  Labs Reviewed  BASIC METABOLIC PANEL - Abnormal; Notable for the following components:      Result Value   Glucose, Bld 181 (*)    BUN 27 (*)    Creatinine, Ser 2.12 (*)    Calcium 10.8 (*)    GFR, Estimated 23 (*)    All other components within normal limits  CBC - Abnormal; Notable for the following components:   RBC 3.66 (*)    Hemoglobin 10.2 (*)    HCT 32.7 (*)    RDW 16.8 (*)    All other components within normal limits  BRAIN NATRIURETIC PEPTIDE - Abnormal; Notable for the following components:   B Natriuretic Peptide 944.1 (*)    All other components within normal limits  TROPONIN I (HIGH SENSITIVITY) - Abnormal; Notable for the following components:   Troponin I (High Sensitivity) 48 (*)    All other components within normal limits  TROPONIN I (HIGH SENSITIVITY) - Abnormal; Notable for the following components:   Troponin I (High Sensitivity) 57 (*)    All other components within normal limits  RESP PANEL BY RT-PCR (FLU A&B, COVID) ARPGX2  PROCALCITONIN    ____________________________________________  EKG  Reviewed inter by me at 1525 Heart rate 120 QRS  99 QTc 490 Sinus tachycardia, mild QT prolongation.  Mild nonspecific T wave abnormality.  No STEMI. ____________________________________________  RADIOLOGY  DG Chest 2 View  Result Date: 12/24/2020 CLINICAL DATA:  Chest pain EXAM: CHEST - 2 VIEW COMPARISON:  Chest x-ray 11/27/2020, CT chest 11/27/2020 FINDINGS: The heart and mediastinal contours are unchanged with persistent small pericardial effusion. Aortic calcification. Bilateral lower lobe streaky airspace opacities. No pulmonary edema. Likely trace bilateral pleural effusions. No pneumothorax. No acute osseous abnormality. IMPRESSION: 1. Persistent likely small volume pericardial effusion. 2. Trace bilateral pleural effusions not excluded. 3. Bilateral lower lobe streaky airspace opacities. Findings may represent a combination of infection/invasion versus atelectasis. Electronically Signed   By: Iven Finn M.D.   On: 12/24/2020 17:25     Chest x-ray reviewed notable for possibly a very small volume pericardial effusion.  Trace bilateral pleural effusions cannot be excluded.  Bilateral lower lobe streaky airspace opacities ____________________________________________   PROCEDURES  Procedure(s) performed: None  Procedures  Critical Care performed: No  ____________________________________________   INITIAL IMPRESSION / ASSESSMENT AND PLAN / ED COURSE  Pertinent labs & imaging results that were available during my care of the patient were reviewed by me and considered in my medical decision making (see chart for details).   Burning in her chest with mild shortness of breath.  Shortness of breath worsening for last few days.  Clinical exam history [place] seems suggestive of possible CHF, also potentially could have a small pericardial effusion.  Reviewed previous CT imaging.  Her symptoms seem very positional and  reports some orthopnea but also nighttime dyspnea as well.  Symptoms and clinical exam assessment seem most consistent with probable mild CHF, also suspicion she may have a small pericardial effusion by imaging.  Patient given her current renal function not a good candidate for CT angiography of the chest.  No pleuritic chest pain no clinical exam elements that would make me clearly think about the etiology being a pulmonary embolism.  No signs or symptoms suggest dissection ----------------------------------------- 5:43 PM on 12/24/2020 ----------------------------------------- Patient reports improvement after receiving first nitroglycerin chest pain has resolved.  Currently utilizing nebulizer treatment.  Fully awake and alert.  ----------------------------------------- 7:45 PM on 12/24/2020 ----------------------------------------- Patient is resting at this time.  She does report very mild sense of chest discomfort at this point.  Second troponin with only minimal elevation from previous.  I do not see evidence to support clear ACS at this point but certainly patient with known coronary disease at risk that this may represent unstable angina.  We will apply nitroglycerin paste and have also ordered Lasix for light diuresis at this point as I suspect possibly underlying CHF could be driving her symptoms  We will certainly admit to hospitalist for further care management and work-up.  Patient understanding very agreeable to plan.  Hemodynamically stable without acute distress at this time.   Case discussed with hospitalist Dr. Posey Pronto      ____________________________________________   FINAL CLINICAL IMPRESSION(S) / ED DIAGNOSES  Final diagnoses:  Acute on chronic congestive heart failure, unspecified heart failure type ([place])  Chest pain, moderate risk ACS Dyspnea    Above diagnoses are provisional.  I did discuss case with the hospitalist Dr. Posey Pronto.  She will plan to admit the patient  also a plan to obtain further work-up such as V/Q scanning to exclude PE which patient has not a good candidate for CT angiography due to reduced GFR.  At this point though I have a somewhat  low pretest probability for PE though she did recently have COVID as well which may increase her risk slightly.  No overt evidence by clinical exam other than a slight tachycardia that would be highly suggestive of PE however.  No pleuritic chest pain no unilateral leg swelling no venous cords or congestion no thigh pain etc.  Note:  This document was prepared using Dragon voice recognition software and may include unintentional dictation errors       Delman Kitten, MD 12/24/20 1958

## 2020-12-25 ENCOUNTER — Other Ambulatory Visit: Payer: Self-pay

## 2020-12-25 ENCOUNTER — Encounter
Admission: EM | Disposition: A | Discharge status: Home / Self Care | Payer: Self-pay | Source: Home / Self Care | Attending: Internal Medicine

## 2020-12-25 ENCOUNTER — Other Ambulatory Visit: Payer: Medicare Other

## 2020-12-25 DIAGNOSIS — I25110 Atherosclerotic heart disease of native coronary artery with unstable angina pectoris: Principal | ICD-10-CM

## 2020-12-25 DIAGNOSIS — E1122 Type 2 diabetes mellitus with diabetic chronic kidney disease: Secondary | ICD-10-CM

## 2020-12-25 DIAGNOSIS — N179 Acute kidney failure, unspecified: Secondary | ICD-10-CM

## 2020-12-25 DIAGNOSIS — I10 Essential (primary) hypertension: Secondary | ICD-10-CM

## 2020-12-25 DIAGNOSIS — R079 Chest pain, unspecified: Secondary | ICD-10-CM | POA: Diagnosis not present

## 2020-12-25 DIAGNOSIS — K219 Gastro-esophageal reflux disease without esophagitis: Secondary | ICD-10-CM

## 2020-12-25 DIAGNOSIS — N184 Chronic kidney disease, stage 4 (severe): Secondary | ICD-10-CM | POA: Diagnosis not present

## 2020-12-25 DIAGNOSIS — I200 Unstable angina: Secondary | ICD-10-CM

## 2020-12-25 HISTORY — PX: LEFT HEART CATH AND CORONARY ANGIOGRAPHY: CATH118249

## 2020-12-25 HISTORY — PX: CORONARY STENT INTERVENTION: CATH118234

## 2020-12-25 LAB — GLUCOSE, CAPILLARY
Glucose-Capillary: 149 mg/dL — ABNORMAL HIGH (ref 70–99)
Glucose-Capillary: 131 mg/dL — ABNORMAL HIGH (ref 70–99)
Glucose-Capillary: 179 mg/dL — ABNORMAL HIGH (ref 70–99)
Glucose-Capillary: 144 mg/dL — ABNORMAL HIGH (ref 70–99)

## 2020-12-25 LAB — POCT ACTIVATED CLOTTING TIME: Activated Clotting Time: 242 seconds

## 2020-12-25 LAB — TSH: TSH: 1.677 u[IU]/mL (ref 0.350–4.500)

## 2020-12-25 LAB — HEMOGLOBIN A1C
Hgb A1c MFr Bld: 8.4 % — ABNORMAL HIGH (ref 4.8–5.6)
Mean Plasma Glucose: 194 mg/dL

## 2020-12-25 LAB — T4, FREE: Free T4: 0.8 ng/dL (ref 0.61–1.12)

## 2020-12-25 SURGERY — LEFT HEART CATH AND CORONARY ANGIOGRAPHY
Anesthesia: Moderate Sedation

## 2020-12-25 MED ORDER — HEPARIN (PORCINE) IN NACL 1000-0.9 UT/500ML-% IV SOLN
INTRAVENOUS | Status: AC
  Filled 2020-12-25: qty 1000

## 2020-12-25 MED ORDER — MIDAZOLAM HCL 2 MG/2ML IJ SOLN
INTRAMUSCULAR | Status: AC
  Filled 2020-12-25: qty 2

## 2020-12-25 MED ORDER — MELATONIN 5 MG PO TABS
5.0000 mg | ORAL_TABLET | Freq: Every day | ORAL | Status: DC
  Administered 2020-12-25 – 2020-12-28 (×4): 5 mg via ORAL
  Filled 2020-12-25 (×4): qty 1

## 2020-12-25 MED ORDER — NITROGLYCERIN 1 MG/10 ML FOR IR/CATH LAB
INTRA_ARTERIAL | Status: AC
  Filled 2020-12-25: qty 10

## 2020-12-25 MED ORDER — SODIUM CHLORIDE 0.9% FLUSH
3.0000 mL | Freq: Two times a day (BID) | INTRAVENOUS | Status: DC
  Administered 2020-12-25 – 2020-12-29 (×7): 3 mL via INTRAVENOUS

## 2020-12-25 MED ORDER — LIDOCAINE HCL 1 % IJ SOLN
INTRAMUSCULAR | Status: AC
  Filled 2020-12-25: qty 20

## 2020-12-25 MED ORDER — SODIUM CHLORIDE 0.9 % IV SOLN: 250.0000 mL | INTRAVENOUS | Status: DC | PRN

## 2020-12-25 MED ORDER — HEPARIN SODIUM (PORCINE) 1000 UNIT/ML IJ SOLN
INTRAMUSCULAR | Status: DC | PRN
  Administered 2020-12-25: 4000 [IU] via INTRAVENOUS
  Administered 2020-12-25: 5000 [IU] via INTRAVENOUS

## 2020-12-25 MED ORDER — SODIUM CHLORIDE 0.9 % IV SOLN: INTRAVENOUS | Status: DC

## 2020-12-25 MED ORDER — LIDOCAINE HCL (PF) 1 % IJ SOLN
INTRAMUSCULAR | Status: DC | PRN
  Administered 2020-12-25: 2 mL

## 2020-12-25 MED ORDER — SODIUM CHLORIDE 0.9% FLUSH: 3.0000 mL | INTRAVENOUS | Status: DC | PRN

## 2020-12-25 MED ORDER — VERAPAMIL HCL 2.5 MG/ML IV SOLN
INTRAVENOUS | Status: DC | PRN
  Administered 2020-12-25: 2.5 mg via INTRA_ARTERIAL

## 2020-12-25 MED ORDER — ISOSORBIDE MONONITRATE ER 30 MG PO TB24
30.0000 mg | ORAL_TABLET | Freq: Every day | ORAL | Status: DC
  Administered 2020-12-25 – 2020-12-29 (×5): 30 mg via ORAL
  Filled 2020-12-25 (×5): qty 1

## 2020-12-25 MED ORDER — MIDAZOLAM HCL 2 MG/2ML IJ SOLN
INTRAMUSCULAR | Status: DC | PRN
  Administered 2020-12-25: 1 mg via INTRAVENOUS

## 2020-12-25 MED ORDER — FENTANYL CITRATE (PF) 100 MCG/2ML IJ SOLN
INTRAMUSCULAR | Status: AC
  Filled 2020-12-25: qty 2

## 2020-12-25 MED ORDER — TORSEMIDE 20 MG PO TABS: 20.0000 mg | ORAL_TABLET | Freq: Every day | ORAL | Status: DC

## 2020-12-25 MED ORDER — HEPARIN (PORCINE) IN NACL 2000-0.9 UNIT/L-% IV SOLN
INTRAVENOUS | Status: DC | PRN
  Administered 2020-12-25: 1000 mL

## 2020-12-25 MED ORDER — ASPIRIN 81 MG PO CHEW: 81.0000 mg | CHEWABLE_TABLET | ORAL | Status: DC

## 2020-12-25 MED ORDER — VERAPAMIL HCL 2.5 MG/ML IV SOLN
INTRAVENOUS | Status: AC
  Filled 2020-12-25: qty 2

## 2020-12-25 MED ORDER — SODIUM CHLORIDE 0.9 % IV SOLN: INTRAVENOUS | Status: AC

## 2020-12-25 MED ORDER — IOHEXOL 350 MG/ML SOLN
INTRAVENOUS | Status: DC | PRN
  Administered 2020-12-25: 120 mL

## 2020-12-25 MED ORDER — FENTANYL CITRATE (PF) 100 MCG/2ML IJ SOLN
INTRAMUSCULAR | Status: DC | PRN
  Administered 2020-12-25 (×2): 25 ug via INTRAVENOUS

## 2020-12-25 MED ORDER — HEPARIN SODIUM (PORCINE) 1000 UNIT/ML IJ SOLN
INTRAMUSCULAR | Status: AC
  Filled 2020-12-25: qty 1

## 2020-12-25 SURGICAL SUPPLY — 21 items
BALLN TREK RX 2.5X20 (BALLOONS) ×2
BALLN ~~LOC~~ EUPHORA RX 3.25X20 (BALLOONS) ×2
BALLOON TREK RX 2.5X20 (BALLOONS) ×1 IMPLANT
BALLOON ~~LOC~~ EUPHORA RX 3.25X20 (BALLOONS) ×1 IMPLANT
CATH EAGLE EYE PLAT IMAGING (CATHETERS) ×2 IMPLANT
CATH LAUNCHER 6FR EBU 4 (CATHETERS) ×2 IMPLANT
CATH LAUNCHER 6FR JL4 (CATHETERS) ×2 IMPLANT
DEVICE RAD TR BAND REGULAR (VASCULAR PRODUCTS) ×2 IMPLANT
DRAPE BRACHIAL (DRAPES) ×2 IMPLANT
GLIDESHEATH SLEND SS 6F .021 (SHEATH) ×2 IMPLANT
GUIDEWIRE INQWIRE 1.5J.035X260 (WIRE) ×1 IMPLANT
INQWIRE 1.5J .035X260CM (WIRE) ×2
KIT ENCORE 26 ADVANTAGE (KITS) ×2 IMPLANT
PACK CARDIAC CATH (CUSTOM PROCEDURE TRAY) ×2 IMPLANT
PANNUS RETENTION SYSTEM 2 PAD (MISCELLANEOUS) ×2 IMPLANT
PROTECTION STATION PRESSURIZED (MISCELLANEOUS) ×2
SET ATX SIMPLICITY (MISCELLANEOUS) ×2 IMPLANT
STATION PROTECTION PRESSURIZED (MISCELLANEOUS) ×1 IMPLANT
STENT ONYX FRONTIER 3.0X26 (Permanent Stent) ×2 IMPLANT
TUBING CIL FLEX 10 FLL-RA (TUBING) ×2 IMPLANT
WIRE RUNTHROUGH .014X180CM (WIRE) ×2 IMPLANT

## 2020-12-25 NOTE — Plan of Care (Signed)
New admit A&OX4, oriented to room, had one episode of Chest pain, given Nitro Sublingual twice, it help a little bit but was not given the third dose as the order set says due to BP in 90s. Now patient stated chest pain is gone. No BM. NPO effective midnight for pre procedure protocol. Monitored closely. Problem: Health Behavior/Discharge Planning: Goal: Ability to manage health-related needs will improve Outcome: Progressing   Problem: Clinical Measurements: Goal: Ability to maintain clinical measurements within normal limits will improve Outcome: Progressing Goal: Will remain free from infection Outcome: Progressing Goal: Diagnostic test results will improve Outcome: Progressing Goal: Respiratory complications will improve Outcome: Progressing Goal: Cardiovascular complication will be avoided Outcome: Progressing   Problem: Activity: Goal: Risk for activity intolerance will decrease Outcome: Progressing   Problem: Elimination: Goal: Will not experience complications related to bowel motility Outcome: Progressing Goal: Will not experience complications related to urinary retention Outcome: Progressing   Problem: Pain Managment: Goal: General experience of comfort will improve Outcome: Progressing   Problem: Safety: Goal: Ability to remain free from injury will improve Outcome: Progressing   Problem: Skin Integrity: Goal: Risk for impaired skin integrity will decrease Outcome: Progressing

## 2020-12-25 NOTE — Progress Notes (Deleted)
Patient ID: Mckenzie Taylor, female   DOB: September 12, 1940, 80 y.o.   MRN: [id number] [name] to patient room 3 times today and pt still has not returned from procedure.  Will check back

## 2020-12-25 NOTE — H&P (View-Only) (Signed)
Cardiology Consultation:   Patient ID: [name] MRN: [id number]; DOB: Jul 27, 1940  Admit date: 12/24/2020 Date of Consult: 12/25/2020  PCP:  Mckenzie Lank, MD   [facility] Providers Cardiologist:  Mckenzie Rogue, MD   { Physician requesting consult: Dr. Kurtis Taylor Reason for consult: Unstable angina  Patient Profile:   Mckenzie Taylor is a 80 y.o. female with a hx of diabetes type 2, COPD, ischemic cardiomyopathy, three-vessel coronary disease, catheterization August 2022 medical management recommended at that time, presenting with unstable angina symptoms  History of Present Illness:   Ms. Mckenzie Taylor has had admissions approximately once a month over the past year for unstable angina symptoms, initially she wanted medical management and no catheterization but she underwent cardiac catheterization August 2022 showing three-vessel disease, medical management recommended by cardiology at that time.  For unclear reasons, was not seen by her primary cardiology team. Since then she has been in the hospital for shortness of breath, COVID-19 infection, acute on chronic systolic CHF discharge last month Presenting to the emergency room with worsening burning substernal chest pain consistent with her angina.  Reports that she ran out of her nitro, as symptoms did not improve she presented to the emergency room  In the ER she reported having central chest pain radiating to bilateral arms, center of her back, 10/10 Some shortness of breath with some nausea. "  Feels just like my heart attack" She has remained tachycardic heart rates 110-1 20,  Lab work reviewed creatinine 2.1, troponin 57, BNP 944, hemoglobin 10.2  Review of catheterization report as below from October 17, 2020   Mid RCA lesion is 30% stenosed.   Ost Cx to Prox Cx lesion is 30% stenosed.   Ramus lesion is 40% stenosed.   Prox LAD lesion is 55% stenosed.   Mid LAD lesion is 60% stenosed.   1st Diag lesion is 100%  stenosed.   Dist LAD lesion is 80% stenosed.   Ost LM to Mid LM lesion is 40% stenosed.   LV end diastolic pressure is moderately elevated.   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 Estimates quit ~ 2015   GERD (gastroesophageal reflux disease)    HTN (hypertension)    Hyperlipidemia with target LDL less than 70    Hypothyroidism    subclinical. low TSH, normal thyroid panel. biopsy 2010   Vitamin B12 deficiency     Past Surgical History:  Procedure Laterality Date   CHOLECYSTECTOMY     hip replacement     bilateral-secondary to avascular necrosis   LEFT HEART CATH AND CORONARY ANGIOGRAPHY N/A 10/17/2020   Procedure: LEFT HEART CATH AND CORONARY ANGIOGRAPHY;  Surgeon: Mckenzie Skains, MD;  Location: [facility];  Service: Cardiovascular;  Laterality: N/A;   right eye lens replacement  ULNAR NERVE REPAIR     bilateral   VESICOVAGINAL FISTULA CLOSURE W/ TAH       Home Medications:  Prior to Admission medications   Medication Sig Start Date End Date Taking? Authorizing Provider  albuterol (VENTOLIN HFA) 108 (90 Base) MCG/ACT inhaler Inhale 2 puffs into the lungs every 4 (four) hours as needed for wheezing or shortness of breath.   Yes [provider]  amLODipine (NORVASC) 10 MG tablet Take 10 mg by mouth at bedtime.   Yes [provider]  aspirin EC 81 MG EC tablet Take 1  tablet (81 mg total) by mouth daily. 11/11/18  Yes Mckenzie Lighter, MD  budesonide (PULMICORT) 0.25 MG/2ML nebulizer solution Take 2 mLs (0.25 mg total) by nebulization 2 (two) times daily. 10/20/20  Yes Mckenzie Sane, MD  busPIRone (BUSPAR) 5 MG tablet Take 5 mg by mouth 2 (two) times daily.   Yes [provider]  carvedilol (COREG) 6.25 MG tablet Take 1 tablet (6.25 mg total) by mouth 2 (two) times daily with a meal. 11/11/18  Yes Mckenzie Lighter, MD  clopidogrel (PLAVIX) 75 MG tablet Take 75 mg by mouth daily.   Yes [provider]  Cyanocobalamin 1500 MCG TBDP Take 4,500 mcg by mouth daily.   Yes [provider]  Fluticasone-Umeclidin-Vilant 100-62.5-25 MCG/INH AEPB Inhale 1 puff into the lungs daily.   Yes [provider]  insulin detemir (LEVEMIR) 100 UNIT/ML FlexPen Inject 42 Units into the skin at bedtime.   Yes [provider]  insulin lispro (HUMALOG) 100 UNIT/ML KwikPen Inject 0-22 Units into the skin 3 (three) times daily with meals. Sliding scale 10/26/18  Yes [provider]  nitroGLYCERIN (NITROSTAT) 0.4 MG SL tablet Place 1 tablet (0.4 mg total) under the tongue every 5 (five) minutes as needed for chest pain. 10/20/20  Yes Mckenzie Sane, MD  oxyCODONE-acetaminophen (PERCOCET/ROXICET) 5-325 MG tablet Take 1 tablet by mouth at bedtime. 11/30/20  Yes Masters, Katie, DO  promethazine (PHENERGAN) 25 MG tablet Take 25 mg by mouth every 6 (six) hours as needed for nausea.    Yes [provider]  rosuvastatin (CRESTOR) 10 MG tablet Take 10 mg by mouth daily.   Yes [provider]  torsemide (DEMADEX) 20 MG tablet Take 20 mg by mouth daily.   Yes [provider]  traZODone (DESYREL) 50 MG tablet Take 50 mg by mouth at bedtime. 10/28/20  Yes [provider]  Cholecalciferol (VITAMIN D3) 5000 units CAPS Take 5,000 Units by mouth daily. Patient not taking: Reported on 12/24/2020    [provider]   Dulaglutide (TRULICITY) 1.5 TT/0.1XB SOPN Inject 1.5 mg into the skin every Thursday.    [provider]  iron polysaccharides (NIFEREX) 150 MG capsule Take 1 capsule (150 mg total) by mouth daily. 10/20/20 11/19/20  Mckenzie Sane, MD  metolazone (ZAROXOLYN) 2.5 MG tablet Take 1 tablet (2.5 mg total) by mouth daily as needed (for weight gain > 3 pounds in 1 day). Patient not taking: Reported on 12/24/2020 11/11/18   Mckenzie Lighter, MD  omeprazole (PRILOSEC) 20 MG capsule Take 20 mg by mouth daily. Patient not taking: Reported on 12/24/2020    [provider]  tiZANidine (ZANAFLEX) 4 MG tablet Take 4 mg by mouth 3 (three) times daily as needed for muscle spasms.     [provider]    Inpatient Medications: Scheduled Meds:  aspirin EC  81 mg Oral Daily   budesonide  0.25 mg Nebulization BID  carvedilol  6.25 mg Oral BID WC   clopidogrel  75 mg Oral Daily   heparin  5,000 Units Subcutaneous Q8H   insulin aspart  0-9 Units Subcutaneous TID WC   isosorbide mononitrate  30 mg Oral Daily   rosuvastatin  10 mg Oral Daily   traZODone  50 mg Oral QHS   umeclidinium-vilanterol  1 puff Inhalation Daily   Continuous Infusions:  PRN Meds: acetaminophen, nitroGLYCERIN, ondansetron (ZOFRAN) IV  Allergies:    Allergies  Allergen Reactions   Other Rash    Pt reports allergy to metals. Patient also reports allergic to "ice" and it makes her skin swell where it touches.  She states she can drink water with no issues.    Social History:   Social History   Socioeconomic History   Marital status: Married    Spouse name: Not on file   Number of children: Not on file   Years of education: Not on file   Highest education level: Not on file  Occupational History   Not on file  Tobacco Use   Smoking status: Former    Packs/day: 1.00    Years: 50.00    Pack years: 50.00    Types: Cigarettes   Smokeless tobacco: Never   Tobacco comments:    1 ppd - 50 years    Substance and Sexual Activity   Alcohol use: No   Drug use: No   Sexual activity: Not Currently  Other Topics Concern   Not on file  Social History Narrative   Separated, has 2 adult children.    Retired Regulatory affairs officer, on disability.    Social Determinants of Health   Financial Resource Strain: Not on file  Food Insecurity: Not on file  Transportation Needs: Not on file  Physical Activity: Not on file  Stress: Not on file  Social Connections: Not on file  Intimate Partner Violence: Not on file    Family History:    Family History  Problem Relation Age of Onset   Cancer Father        lung   COPD Mother    Heart disease Mother    Heart disease Maternal Uncle      ROS:  Please see the history of present illness.  Review of Systems  Constitutional: Negative.   HENT: Negative.    Respiratory:  Positive for shortness of breath.   Cardiovascular:  Positive for chest pain.  Gastrointestinal: Negative.   Musculoskeletal: Negative.   Neurological: Negative.   Psychiatric/Behavioral: Negative.    All other systems reviewed and are negative.   Physical Exam/Data:   Vitals:   12/25/20 0726 12/25/20 0752 12/25/20 0800 12/25/20 0841  BP:  (!) 148/93    Pulse:  (!) 108 (!) 119   Resp: 20 18 19 20   Temp:  97.7 F (36.5 C)    TempSrc:  Oral    SpO2:  100% 100%   Weight:      Height:       No intake or output data in the 24 hours ending 12/25/20 1039 Last 3 Weights 12/24/2020 11/30/2020 11/29/2020  Weight (lbs) 194 lb 197 lb 1.6 oz 194 lb 3.6 oz  Weight (kg) 87.998 kg 89.404 kg 88.1 kg     Body mass index is 32.28 kg/m.  General:  Well nourished, well developed, in no acute distress HEENT: normal Neck: no JVD Vascular: No carotid bruits; Distal pulses 2+ bilaterally Cardiac:  normal S1, S2; RRR; no murmur  Lungs: Moderately  decreased breath sounds, scattered Rales Abd: soft, nontender, no hepatomegaly  Ext: no edema Musculoskeletal:  No deformities, BUE and BLE  strength normal and equal Skin: warm and dry  Neuro:  CNs 2-12 intact, no focal abnormalities noted Psych:  Normal affect   EKG:  The EKG was personally reviewed and demonstrates:   Sinus tachycardia rate 120 bpm nonspecific ST and T wave abnormality  Telemetry:  Telemetry was personally reviewed and demonstrates: Sinus tachycardia  Relevant CV Studies: Echocardiogram July 2022 ejection fraction 25% global hypokinesis  Laboratory Data:  High Sensitivity Troponin:   Recent Labs  Lab 11/27/20 0831 11/27/20 1013 12/24/20 1519 12/24/20 1730  TROPONINIHS 36* 42* 48* 57*     Chemistry Recent Labs  Lab 12/24/20 1519  NA 140  K 4.1  CL 103  CO2 27  GLUCOSE 181*  BUN 27*  CREATININE 2.12*  CALCIUM 10.8*  GFRNONAA 23*  ANIONGAP 10    No results for input(s): PROT, ALBUMIN, AST, ALT, ALKPHOS, BILITOT in the last 168 hours. Lipids No results for input(s): CHOL, TRIG, HDL, LABVLDL, LDLCALC, CHOLHDL in the last 168 hours.  Hematology Recent Labs  Lab 12/24/20 1519  WBC 8.5  RBC 3.66*  HGB 10.2*  HCT 32.7*  MCV 89.3  MCH 27.9  MCHC 31.2  RDW 16.8*  PLT 361   Thyroid  Recent Labs  Lab 12/24/20 2325  TSH 1.677  FREET4 0.80    BNP Recent Labs  Lab 12/24/20 1519  BNP 944.1*    DDimer No results for input(s): DDIMER in the last 168 hours.   Radiology/Studies:  DG Chest 2 View  Result Date: 12/24/2020 CLINICAL DATA:  Chest pain EXAM: CHEST - 2 VIEW COMPARISON:  Chest x-ray 11/27/2020, CT chest 11/27/2020 FINDINGS: The heart and mediastinal contours are unchanged with persistent small pericardial effusion. Aortic calcification. Bilateral lower lobe streaky airspace opacities. No pulmonary edema. Likely trace bilateral pleural effusions. No pneumothorax. No acute osseous abnormality. IMPRESSION: 1. Persistent likely small volume pericardial effusion. 2. Trace bilateral pleural effusions not excluded. 3. Bilateral lower lobe streaky airspace opacities. Findings may  represent a combination of infection/invasion versus atelectasis. Electronically Signed   By: Iven Finn M.D.   On: 12/24/2020 17:25     Assessment and Plan:   Coronary artery disease with unstable angina Frequent readmissions for unstable angina symptoms, prior cardiac catheterization August 2022 Review of images personally by myself concerning for proximal/ostial LAD disease, very hazy -Case discussed with interventional cardiology Dr. Fletcher Anon and Dr. Saunders Revel. They have expressed concern for the ostial/proximal LAD as well as other regions of stenosis -Given her continued unstable angina symptoms requiring nitro, will recommend repeat catheterization, IVUS/FFR, consideration of stenting if indicated --Symptoms for medications will restart Coreg 6.25, may need higher dosing given her tachycardia --Appears her isosorbide/hydralazine BiDil combo was discontinued on prior hospitalization, we will restart Imdur, titrate up as blood pressure tolerates, consider restarting her hydralazine -Not a good candidate for ACE/ARB/Entresto given renal dysfunction creatinine 2.1  Ischemic cardiomyopathy Plan for cardiac catheterization as above Ejection fraction 25%, repeat echo pending -Continue carvedilol, isosorbide,  consider adding hydralazine, SGLT2 inhibitor  Chronic renal insufficiency In the setting of diabetes, cardiomyopathy, Volume status unclear, hold torsemide at this time with catheterization pending  Hyperlipidemia:  High intensity statin indicated, goal LDL less than 60  Essential hypertension Carvedilol, isosorbide for now Titrate up on isosorbide, consider adding hydralazine as blood pressure tolerates Discontinue amlodipine in the setting of cardiomyopathy   Total encounter time  more than 110 minutes  Greater than 50% was spent in counseling and coordination of care with the patient   For questions or updates, please contact [place] Please consult [email or web address] for  contact info under    Signed, Mckenzie Rogue, MD  12/25/2020 10:39 AM

## 2020-12-25 NOTE — Progress Notes (Signed)
PROGRESS NOTE    Mckenzie Taylor  XBJ:[id number] DOB: 1940-07-16 DOA: 12/24/2020 PCP: Denton Lank, MD    Brief Narrative:  Mckenzie Taylor is a 80 y.o. female seen in ed with complaints of chest pain, two days ago. She was out of her NTG.located in middle of chest travelling to both arm, and center of her back.   10/13 s/p cath today. Pt was seen in recovery room  Consultants:  cardiology  Procedures:   Antimicrobials:     Subjective: Feels sob is better than when she came in overall .  Objective: Vitals:   12/25/20 1600 12/25/20 1630 12/25/20 1633 12/25/20 1947  BP: 105/80  105/85 111/85  Pulse: 96 96 100 (!) 103  Resp: (!) 26 (!) 29 (!) 24 20  Temp:      TempSrc:      SpO2: 95% 95% 96% 96%  Weight:      Height:        Intake/Output Summary (Last 24 hours) at 12/25/2020 1956 Last data filed at 12/25/2020 1800 Gross per 24 hour  Intake 115 ml  Output --  Net 115 ml   Filed Weights   12/24/20 1519  Weight: 88 kg    Examination:  General exam: Appears calm and comfortable  Respiratory system: Clear to auscultation. Respiratory effort normal.increase expiratory time Cardiovascular system: S1 & S2 heard, RRR. No gallop Gastrointestinal system: Abdomen is nondistended, soft and nontender. . Normal bowel sounds heard. Central nervous system: Alert and oriented.grossly intact Extremities: no edema Psychiatry: Judgement and insight appear normal. Mood & affect appropriate.     Data Reviewed: I have personally reviewed following labs and imaging studies  CBC: Recent Labs  Lab 12/24/20 1519  WBC 8.5  HGB 10.2*  HCT 32.7*  MCV 89.3  PLT 308   Basic Metabolic Panel: Recent Labs  Lab 12/24/20 1519  NA 140  K 4.1  CL 103  CO2 27  GLUCOSE 181*  BUN 27*  CREATININE 2.12*  CALCIUM 10.8*   GFR: Estimated Creatinine Clearance: 24 mL/min (A) (by C-G formula based on SCr of 2.12 mg/dL (H)). Liver Function Tests: No results for input(s): AST,  ALT, ALKPHOS, BILITOT, PROT, ALBUMIN in the last 168 hours. No results for input(s): LIPASE, AMYLASE in the last 168 hours. No results for input(s): AMMONIA in the last 168 hours. Coagulation Profile: No results for input(s): INR, PROTIME in the last 168 hours. Cardiac Enzymes: No results for input(s): CKTOTAL, CKMB, CKMBINDEX, TROPONINI in the last 168 hours. BNP (last 3 results) No results for input(s): PROBNP in the last 8760 hours. HbA1C: Recent Labs    12/24/20 2325  HGBA1C 8.4*   CBG: Recent Labs  Lab 12/24/20 2114 12/25/20 0755 12/25/20 1152 12/25/20 1737  GLUCAP 195* 149* 131* 179*   Lipid Profile: No results for input(s): CHOL, HDL, LDLCALC, TRIG, CHOLHDL, LDLDIRECT in the last 72 hours. Thyroid Function Tests: Recent Labs    12/24/20 2325  TSH 1.677  FREET4 0.80   Anemia Panel: No results for input(s): VITAMINB12, FOLATE, FERRITIN, TIBC, IRON, RETICCTPCT in the last 72 hours. Sepsis Labs: Recent Labs  Lab 12/24/20 1721  PROCALCITON 0.22    Recent Results (from the past 240 hour(s))  Resp Panel by RT-PCR (Flu A&B, Covid) Nasopharyngeal Swab     Status: None   Collection Time: 12/24/20  8:02 PM   Specimen: Nasopharyngeal Swab; Nasopharyngeal(NP) swabs in vial transport medium  Result Value Ref Range Status   SARS Coronavirus 2 by  RT PCR NEGATIVE NEGATIVE Final    Comment: (NOTE) SARS-CoV-2 target nucleic acids are NOT DETECTED.  The SARS-CoV-2 RNA is generally detectable in upper respiratory specimens during the acute phase of infection. The lowest concentration of SARS-CoV-2 viral copies this assay can detect is 138 copies/mL. A negative result does not preclude SARS-Cov-2 infection and should not be used as the sole basis for treatment or other patient management decisions. A negative result may occur with  improper specimen collection/handling, submission of specimen other than nasopharyngeal swab, presence of viral mutation(s) within the areas  targeted by this assay, and inadequate number of viral copies(<138 copies/mL). A negative result must be combined with clinical observations, patient history, and epidemiological information. The expected result is Negative.  Fact Sheet for Patients:  EntrepreneurPulse.com.au  Fact Sheet for Healthcare Providers:  IncredibleEmployment.be  This test is no t yet approved or cleared by the [place] FDA and  has been authorized for detection and/or diagnosis of SARS-CoV-2 by FDA under an Emergency Use Authorization (EUA). This EUA will remain  in effect (meaning this test can be used) for the duration of the COVID-19 declaration under Section 564(b)(1) of the Act, 21 U.S.C.section 360bbb-3(b)(1), unless the authorization is terminated  or revoked sooner.       Influenza A by PCR NEGATIVE NEGATIVE Final   Influenza B by PCR NEGATIVE NEGATIVE Final    Comment: (NOTE) The Xpert Xpress SARS-CoV-2/FLU/RSV plus assay is intended as an aid in the diagnosis of influenza from Nasopharyngeal swab specimens and should not be used as a sole basis for treatment. Nasal washings and aspirates are unacceptable for Xpert Xpress SARS-CoV-2/FLU/RSV testing.  Fact Sheet for Patients: EntrepreneurPulse.com.au  Fact Sheet for Healthcare Providers: IncredibleEmployment.be  This test is not yet approved or cleared by the [place] FDA and has been authorized for detection and/or diagnosis of SARS-CoV-2 by FDA under an Emergency Use Authorization (EUA). This EUA will remain in effect (meaning this test can be used) for the duration of the COVID-19 declaration under Section 564(b)(1) of the Act, 21 U.S.C. section 360bbb-3(b)(1), unless the authorization is terminated or revoked.  Performed at [facility], [address] [place], [place] [address]          Radiology Studies: DG Chest 2 View  Result  Date: 12/24/2020 CLINICAL DATA:  Chest pain EXAM: CHEST - 2 VIEW COMPARISON:  Chest x-ray 11/27/2020, CT chest 11/27/2020 FINDINGS: The heart and mediastinal contours are unchanged with persistent small pericardial effusion. Aortic calcification. Bilateral lower lobe streaky airspace opacities. No pulmonary edema. Likely trace bilateral pleural effusions. No pneumothorax. No acute osseous abnormality. IMPRESSION: 1. Persistent likely small volume pericardial effusion. 2. Trace bilateral pleural effusions not excluded. 3. Bilateral lower lobe streaky airspace opacities. Findings may represent a combination of infection/invasion versus atelectasis. Electronically Signed   By: Iven Finn M.D.   On: 12/24/2020 17:25   CARDIAC CATHETERIZATION  Result Date: 12/25/2020   Ost LM to Mid LM lesion is 40% stenosed.   Dist LAD lesion is 80% stenosed.   Ost Cx to Prox Cx lesion is 30% stenosed.   Ramus lesion is 40% stenosed.   1st Diag lesion is 100% stenosed.   Ost LAD to Prox LAD lesion is 80% stenosed.   Prox LAD lesion is 60% stenosed.   A drug-eluting stent was successfully placed using a STENT ONYX FRONTIER [id number].   Post intervention, there is a 0% residual stenosis.   Post intervention, there is a 0% residual  stenosis. 1.  Significant ostial and proximal LAD disease.  This was significant by IVUS with a minimal luminal area of 3.5 cm.  Mild ramus and left circumflex disease.  The left circumflex is dominant. 2.  Moderately elevated left ventricular end-diastolic pressure at 22 mmHg. 3.  Successful angioplasty and drug-eluting stent placement to the proximal/ostial LAD with 1 stent. Recommendations: Dual antiplatelet therapy for at least 12 months. Aggressive treatment of risk factors.  Gentle hydration for 6 to 8 hours to prevent contrast-induced nephropathy given significant underlying chronic kidney disease.  The procedure was overall difficult and required 120 mL of contrast.        Scheduled  Meds:  aspirin EC  81 mg Oral Daily   budesonide  0.25 mg Nebulization BID   carvedilol  6.25 mg Oral BID WC   clopidogrel  75 mg Oral Daily   heparin  5,000 Units Subcutaneous Q8H   insulin aspart  0-9 Units Subcutaneous TID WC   isosorbide mononitrate  30 mg Oral Daily   rosuvastatin  10 mg Oral Daily   sodium chloride flush  3 mL Intravenous Q12H   traZODone  50 mg Oral QHS   umeclidinium-vilanterol  1 puff Inhalation Daily   Continuous Infusions:  sodium chloride 50 mL/hr at 12/25/20 1542   sodium chloride      Assessment & Plan:   Principal Problem:   Chest pain Active Problems:   GERD (gastroesophageal reflux disease)   HTN (hypertension)   Diabetes type 2, controlled (HCC)   CKD (chronic kidney disease), stage IV (HCC)   CAD (coronary artery disease)   Hypothyroidism   Unstable angina ([place])   Unstable angina with CAD S/p cath today found with signifi. Ostial and prox LAD dz. S/p DES to prox/ostial LAD.  Continue Imdur, beta blk, asa, statin, and plavix Cardiology following    2. HTN- stable Continue with coreg and Imdur Amlodipine d/'cd in setting of CM.  3. DL- continue statin Goal LDL <70   4.DMII-  Continue RISS  5.AKI on CKD IV Monitor post cath Avoid nephrotoxic meds  6.ICM EF 25%-35% Continue coreg, IM S/p cath Hold torsemide since went for cath. I/o Daily Wt.      DVT prophylaxis: heparin Code Status:full Family Communication: none at bedside Disposition Plan:  Status is: Inpatient  Remains inpatient appropriate because:Inpatient level of care appropriate due to severity of illness   Dispo: The patient is from: Home              Anticipated d/c is to: Home              Patient currently is not medically stable to d/c.              Difficult to place patient No            LOS: 1 day   Time spent:45 min with >50% on coc    Nolberto Hanlon, MD [facility] Pager [phone]  If 7PM-7AM, please contact  night-coverage 12/25/2020, 7:56 PM

## 2020-12-25 NOTE — Interval H&P Note (Signed)
Cath Lab Visit (complete for each Cath Lab visit)  Clinical Evaluation Leading to the Procedure:   ACS: Yes.    Non-ACS:  n/a   History and Physical Interval Note:  12/25/2020 12:30 PM  Mckenzie Taylor  has presented today for surgery, with the diagnosis of unstable angina.  The various methods of treatment have been discussed with the patient and family. After consideration of risks, benefits and other options for treatment, the patient has consented to  Procedure(s): LEFT HEART CATH AND CORONARY ANGIOGRAPHY (N/A) as a surgical intervention.  The patient's history has been reviewed, patient examined, no change in status, stable for surgery.  I have reviewed the patient's chart and labs.  Questions were answered to the patient's satisfaction.     Kathlyn [place]

## 2020-12-25 NOTE — Consult Note (Signed)
Cardiology Consultation:   Patient ID: Mckenzie Taylor MRN: [id number]; DOB: Feb 07, 1941  Admit date: 12/24/2020 Date of Consult: 12/25/2020  PCP:  Mckenzie Lank, MD   [facility] Providers Cardiologist:  Mckenzie Rogue, MD   { Physician requesting consult: Mckenzie Taylor Reason for consult: Unstable angina  Patient Profile:   Mckenzie Taylor is a 80 y.o. female with a hx of diabetes type 2, COPD, ischemic cardiomyopathy, three-vessel coronary disease, catheterization August 2022 medical management recommended at that time, presenting with unstable angina symptoms  History of Present Illness:   Mckenzie Taylor has had admissions approximately once a month over the past year for unstable angina symptoms, initially she wanted medical management and no catheterization but she underwent cardiac catheterization August 2022 showing three-vessel disease, medical management recommended by cardiology at that time.  For unclear reasons, was not seen by her primary cardiology team. Since then she has been in the hospital for shortness of breath, COVID-19 infection, acute on chronic systolic CHF discharge last month Presenting to the emergency room with worsening burning substernal chest pain consistent with her angina.  Reports that she ran out of her nitro, as symptoms did not improve she presented to the emergency room  In the ER she reported having central chest pain radiating to bilateral arms, center of her back, 10/10 Some shortness of breath with some nausea. "  Feels just like my heart attack" She has remained tachycardic heart rates 110-1 20,  Lab work reviewed creatinine 2.1, troponin 57, BNP 944, hemoglobin 10.2  Review of catheterization report as below from October 17, 2020   Mid RCA lesion is 30% stenosed.   Ost Cx to Prox Cx lesion is 30% stenosed.   Ramus lesion is 40% stenosed.   Prox LAD lesion is 55% stenosed.   Mid LAD lesion is 60% stenosed.   1st Diag lesion is 100%  stenosed.   Dist LAD lesion is 80% stenosed.   Ost LM to Mid LM lesion is 40% stenosed.   LV end diastolic pressure is moderately elevated.   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 Estimates quit ~ 2015   GERD (gastroesophageal reflux disease)    HTN (hypertension)    Hyperlipidemia with target LDL less than 70    Hypothyroidism    subclinical. low TSH, normal thyroid panel. biopsy 2010   Vitamin B12 deficiency     Past Surgical History:  Procedure Laterality Date   CHOLECYSTECTOMY     hip replacement     bilateral-secondary to avascular necrosis   LEFT HEART CATH AND CORONARY ANGIOGRAPHY N/A 10/17/2020   Procedure: LEFT HEART CATH AND CORONARY ANGIOGRAPHY;  Surgeon: Corey Skains, MD;  Location: [facility];  Service: Cardiovascular;  Laterality: N/A;   right eye lens replacement  ULNAR NERVE REPAIR     bilateral   VESICOVAGINAL FISTULA CLOSURE W/ TAH       Home Medications:  Prior to Admission medications   Medication Sig Start Date End Date Taking? Authorizing Provider  albuterol (VENTOLIN HFA) 108 (90 Base) MCG/ACT inhaler Inhale 2 puffs into the lungs every 4 (four) hours as needed for wheezing or shortness of breath.   Yes [provider]  amLODipine (NORVASC) 10 MG tablet Take 10 mg by mouth at bedtime.   Yes [provider]  aspirin EC 81 MG EC tablet Take 1  tablet (81 mg total) by mouth daily. 11/11/18  Yes Gladstone Lighter, MD  budesonide (PULMICORT) 0.25 MG/2ML nebulizer solution Take 2 mLs (0.25 mg total) by nebulization 2 (two) times daily. 10/20/20  Yes Max Sane, MD  busPIRone (BUSPAR) 5 MG tablet Take 5 mg by mouth 2 (two) times daily.   Yes [provider]  carvedilol (COREG) 6.25 MG tablet Take 1 tablet (6.25 mg total) by mouth 2 (two) times daily with a meal. 11/11/18  Yes Gladstone Lighter, MD  clopidogrel (PLAVIX) 75 MG tablet Take 75 mg by mouth daily.   Yes [provider]  Cyanocobalamin 1500 MCG TBDP Take 4,500 mcg by mouth daily.   Yes [provider]  Fluticasone-Umeclidin-Vilant 100-62.5-25 MCG/INH AEPB Inhale 1 puff into the lungs daily.   Yes [provider]  insulin detemir (LEVEMIR) 100 UNIT/ML FlexPen Inject 42 Units into the skin at bedtime.   Yes [provider]  insulin lispro (HUMALOG) 100 UNIT/ML KwikPen Inject 0-22 Units into the skin 3 (three) times daily with meals. Sliding scale 10/26/18  Yes [provider]  nitroGLYCERIN (NITROSTAT) 0.4 MG SL tablet Place 1 tablet (0.4 mg total) under the tongue every 5 (five) minutes as needed for chest pain. 10/20/20  Yes Max Sane, MD  oxyCODONE-acetaminophen (PERCOCET/ROXICET) 5-325 MG tablet Take 1 tablet by mouth at bedtime. 11/30/20  Yes Masters, Katie, DO  promethazine (PHENERGAN) 25 MG tablet Take 25 mg by mouth every 6 (six) hours as needed for nausea.    Yes [provider]  rosuvastatin (CRESTOR) 10 MG tablet Take 10 mg by mouth daily.   Yes [provider]  torsemide (DEMADEX) 20 MG tablet Take 20 mg by mouth daily.   Yes [provider]  traZODone (DESYREL) 50 MG tablet Take 50 mg by mouth at bedtime. 10/28/20  Yes [provider]  Cholecalciferol (VITAMIN D3) 5000 units CAPS Take 5,000 Units by mouth daily. Patient not taking: Reported on 12/24/2020    [provider]   Dulaglutide (TRULICITY) 1.5 VP/7.1GG SOPN Inject 1.5 mg into the skin every Thursday.    [provider]  iron polysaccharides (NIFEREX) 150 MG capsule Take 1 capsule (150 mg total) by mouth daily. 10/20/20 11/19/20  Max Sane, MD  metolazone (ZAROXOLYN) 2.5 MG tablet Take 1 tablet (2.5 mg total) by mouth daily as needed (for weight gain > 3 pounds in 1 day). Patient not taking: Reported on 12/24/2020 11/11/18   Gladstone Lighter, MD  omeprazole (PRILOSEC) 20 MG capsule Take 20 mg by mouth daily. Patient not taking: Reported on 12/24/2020    [provider]  tiZANidine (ZANAFLEX) 4 MG tablet Take 4 mg by mouth 3 (three) times daily as needed for muscle spasms.     [provider]    Inpatient Medications: Scheduled Meds:  aspirin EC  81 mg Oral Daily   budesonide  0.25 mg Nebulization BID  carvedilol  6.25 mg Oral BID WC   clopidogrel  75 mg Oral Daily   heparin  5,000 Units Subcutaneous Q8H   insulin aspart  0-9 Units Subcutaneous TID WC   isosorbide mononitrate  30 mg Oral Daily   rosuvastatin  10 mg Oral Daily   traZODone  50 mg Oral QHS   umeclidinium-vilanterol  1 puff Inhalation Daily   Continuous Infusions:  PRN Meds: acetaminophen, nitroGLYCERIN, ondansetron (ZOFRAN) IV  Allergies:    Allergies  Allergen Reactions   Other Rash    Pt reports allergy to metals. Patient also reports allergic to "ice" and it makes her skin swell where it touches.  She states she can drink water with no issues.    Social History:   Social History   Socioeconomic History   Marital status: Married    Spouse name: Not on file   Number of children: Not on file   Years of education: Not on file   Highest education level: Not on file  Occupational History   Not on file  Tobacco Use   Smoking status: Former    Packs/day: 1.00    Years: 50.00    Pack years: 50.00    Types: Cigarettes   Smokeless tobacco: Never   Tobacco comments:    1 ppd - 50 years    Substance and Sexual Activity   Alcohol use: No   Drug use: No   Sexual activity: Not Currently  Other Topics Concern   Not on file  Social History Narrative   Separated, has 2 adult children.    Retired Regulatory affairs officer, on disability.    Social Determinants of Health   Financial Resource Strain: Not on file  Food Insecurity: Not on file  Transportation Needs: Not on file  Physical Activity: Not on file  Stress: Not on file  Social Connections: Not on file  Intimate Partner Violence: Not on file    Family History:    Family History  Problem Relation Age of Onset   Cancer Father        lung   COPD Mother    Heart disease Mother    Heart disease Maternal Uncle      ROS:  Please see the history of present illness.  Review of Systems  Constitutional: Negative.   HENT: Negative.    Respiratory:  Positive for shortness of breath.   Cardiovascular:  Positive for chest pain.  Gastrointestinal: Negative.   Musculoskeletal: Negative.   Neurological: Negative.   Psychiatric/Behavioral: Negative.    All other systems reviewed and are negative.   Physical Exam/Data:   Vitals:   12/25/20 0726 12/25/20 0752 12/25/20 0800 12/25/20 0841  BP:  (!) 148/93    Pulse:  (!) 108 (!) 119   Resp: 20 18 19 20   Temp:  97.7 F (36.5 C)    TempSrc:  Oral    SpO2:  100% 100%   Weight:      Height:       No intake or output data in the 24 hours ending 12/25/20 1039 Last 3 Weights 12/24/2020 11/30/2020 11/29/2020  Weight (lbs) 194 lb 197 lb 1.6 oz 194 lb 3.6 oz  Weight (kg) 87.998 kg 89.404 kg 88.1 kg     Body mass index is 32.28 kg/m.  General:  Well nourished, well developed, in no acute distress HEENT: normal Neck: no JVD Vascular: No carotid bruits; Distal pulses 2+ bilaterally Cardiac:  normal S1, S2; RRR; no murmur  Lungs: Moderately  decreased breath sounds, scattered Rales Abd: soft, nontender, no hepatomegaly  Ext: no edema Musculoskeletal:  No deformities, BUE and BLE  strength normal and equal Skin: warm and dry  Neuro:  CNs 2-12 intact, no focal abnormalities noted Psych:  Normal affect   EKG:  The EKG was personally reviewed and demonstrates:   Sinus tachycardia rate 120 bpm nonspecific ST and T wave abnormality  Telemetry:  Telemetry was personally reviewed and demonstrates: Sinus tachycardia  Relevant CV Studies: Echocardiogram July 2022 ejection fraction 25% global hypokinesis  Laboratory Data:  High Sensitivity Troponin:   Recent Labs  Lab 11/27/20 0831 11/27/20 1013 12/24/20 1519 12/24/20 1730  TROPONINIHS 36* 42* 48* 57*     Chemistry Recent Labs  Lab 12/24/20 1519  NA 140  K 4.1  CL 103  CO2 27  GLUCOSE 181*  BUN 27*  CREATININE 2.12*  CALCIUM 10.8*  GFRNONAA 23*  ANIONGAP 10    No results for input(s): PROT, ALBUMIN, AST, ALT, ALKPHOS, BILITOT in the last 168 hours. Lipids No results for input(s): CHOL, TRIG, HDL, LABVLDL, LDLCALC, CHOLHDL in the last 168 hours.  Hematology Recent Labs  Lab 12/24/20 1519  WBC 8.5  RBC 3.66*  HGB 10.2*  HCT 32.7*  MCV 89.3  MCH 27.9  MCHC 31.2  RDW 16.8*  PLT 361   Thyroid  Recent Labs  Lab 12/24/20 2325  TSH 1.677  FREET4 0.80    BNP Recent Labs  Lab 12/24/20 1519  BNP 944.1*    DDimer No results for input(s): DDIMER in the last 168 hours.   Radiology/Studies:  DG Chest 2 View  Result Date: 12/24/2020 CLINICAL DATA:  Chest pain EXAM: CHEST - 2 VIEW COMPARISON:  Chest x-ray 11/27/2020, CT chest 11/27/2020 FINDINGS: The heart and mediastinal contours are unchanged with persistent small pericardial effusion. Aortic calcification. Bilateral lower lobe streaky airspace opacities. No pulmonary edema. Likely trace bilateral pleural effusions. No pneumothorax. No acute osseous abnormality. IMPRESSION: 1. Persistent likely small volume pericardial effusion. 2. Trace bilateral pleural effusions not excluded. 3. Bilateral lower lobe streaky airspace opacities. Findings may  represent a combination of infection/invasion versus atelectasis. Electronically Signed   By: Iven Finn M.D.   On: 12/24/2020 17:25     Assessment and Plan:   Coronary artery disease with unstable angina Frequent readmissions for unstable angina symptoms, prior cardiac catheterization August 2022 Review of images personally by myself concerning for proximal/ostial LAD disease, very hazy -Case discussed with interventional cardiology Dr. Fletcher Anon and Dr. Saunders Revel. They have expressed concern for the ostial/proximal LAD as well as other regions of stenosis -Given her continued unstable angina symptoms requiring nitro, will recommend repeat catheterization, IVUS/FFR, consideration of stenting if indicated --Symptoms for medications will restart Coreg 6.25, may need higher dosing given her tachycardia --Appears her isosorbide/hydralazine BiDil combo was discontinued on prior hospitalization, we will restart Imdur, titrate up as blood pressure tolerates, consider restarting her hydralazine -Not a good candidate for ACE/ARB/Entresto given renal dysfunction creatinine 2.1  Ischemic cardiomyopathy Plan for cardiac catheterization as above Ejection fraction 25%, repeat echo pending -Continue carvedilol, isosorbide,  consider adding hydralazine, SGLT2 inhibitor  Chronic renal insufficiency In the setting of diabetes, cardiomyopathy, Volume status unclear, hold torsemide at this time with catheterization pending  Hyperlipidemia:  High intensity statin indicated, goal LDL less than 60  Essential hypertension Carvedilol, isosorbide for now Titrate up on isosorbide, consider adding hydralazine as blood pressure tolerates Discontinue amlodipine in the setting of cardiomyopathy   Total encounter time  more than 110 minutes  Greater than 50% was spent in counseling and coordination of care with the patient   For questions or updates, please contact [place] Please consult [email or web address] for  contact info under    Signed, Mckenzie Rogue, MD  12/25/2020 10:39 AM

## 2020-12-26 ENCOUNTER — Encounter: Payer: Self-pay | Admitting: Cardiovascular Disease

## 2020-12-26 ENCOUNTER — Inpatient Hospital Stay (HOSPITAL_COMMUNITY)
Admit: 2020-12-26 | Discharge: 2020-12-26 | Disposition: A | Discharge status: Home / Self Care | Payer: Medicare Other | Attending: Cardiovascular Disease | Admitting: Cardiovascular Disease

## 2020-12-26 DIAGNOSIS — I10 Essential (primary) hypertension: Secondary | ICD-10-CM | POA: Diagnosis not present

## 2020-12-26 DIAGNOSIS — I509 Heart failure, unspecified: Secondary | ICD-10-CM | POA: Diagnosis not present

## 2020-12-26 DIAGNOSIS — R079 Chest pain, unspecified: Secondary | ICD-10-CM | POA: Diagnosis not present

## 2020-12-26 DIAGNOSIS — E1122 Type 2 diabetes mellitus with diabetic chronic kidney disease: Secondary | ICD-10-CM | POA: Diagnosis not present

## 2020-12-26 DIAGNOSIS — I25110 Atherosclerotic heart disease of native coronary artery with unstable angina pectoris: Secondary | ICD-10-CM | POA: Diagnosis not present

## 2020-12-26 DIAGNOSIS — N184 Chronic kidney disease, stage 4 (severe): Secondary | ICD-10-CM | POA: Diagnosis not present

## 2020-12-26 DIAGNOSIS — I255 Ischemic cardiomyopathy: Secondary | ICD-10-CM

## 2020-12-26 DIAGNOSIS — K219 Gastro-esophageal reflux disease without esophagitis: Secondary | ICD-10-CM | POA: Diagnosis not present

## 2020-12-26 LAB — CBC
HCT: 31.3 % — ABNORMAL LOW (ref 36.0–46.0)
Hemoglobin: 9.5 g/dL — ABNORMAL LOW (ref 12.0–15.0)
MCH: 27.9 pg (ref 26.0–34.0)
MCHC: 30.4 g/dL (ref 30.0–36.0)
MCV: 91.8 fL (ref 80.0–100.0)
Platelets: 330 10*3/uL (ref 150–400)
RBC: 3.41 MIL/uL — ABNORMAL LOW (ref 3.87–5.11)
RDW: 16.7 % — ABNORMAL HIGH (ref 11.5–15.5)
WBC: 9.3 10*3/uL (ref 4.0–10.5)
nRBC: 0 % (ref 0.0–0.2)

## 2020-12-26 LAB — ECHOCARDIOGRAM COMPLETE
AR max vel: 3.3 cm2
AV Area VTI: 2.84 cm2
AV Area mean vel: 2.99 cm2
AV Mean grad: 6 mmHg
AV Peak grad: 9.4 mmHg
Ao pk vel: 1.53 m/s
Area-P 1/2: 5.97 cm2
Calc EF: 40.4 %
Height: 65 in
MV VTI: 2.97 cm2
S' Lateral: 3.9 cm
Single Plane A2C EF: 38.9 %
Single Plane A4C EF: 37 %
Weight: 3132.8 oz

## 2020-12-26 LAB — BASIC METABOLIC PANEL
Anion gap: 7 (ref 5–15)
BUN: 33 mg/dL — ABNORMAL HIGH (ref 8–23)
CO2: 26 mmol/L (ref 22–32)
Calcium: 9.4 mg/dL (ref 8.9–10.3)
Chloride: 103 mmol/L (ref 98–111)
Creatinine, Ser: 2.41 mg/dL — ABNORMAL HIGH (ref 0.44–1.00)
GFR, Estimated: 20 mL/min — ABNORMAL LOW (ref >60)
Glucose, Bld: 141 mg/dL — ABNORMAL HIGH (ref 70–99)
Potassium: 4.8 mmol/L (ref 3.5–5.1)
Sodium: 136 mmol/L (ref 135–145)

## 2020-12-26 LAB — GLUCOSE, CAPILLARY
Glucose-Capillary: 138 mg/dL — ABNORMAL HIGH (ref 70–99)
Glucose-Capillary: 188 mg/dL — ABNORMAL HIGH (ref 70–99)
Glucose-Capillary: 131 mg/dL — ABNORMAL HIGH (ref 70–99)
Glucose-Capillary: 159 mg/dL — ABNORMAL HIGH (ref 70–99)

## 2020-12-26 MED ORDER — PERFLUTREN LIPID MICROSPHERE
1.0000 mL | INTRAVENOUS | Status: AC | PRN
  Administered 2020-12-26: 3 mL via INTRAVENOUS
  Filled 2020-12-26: qty 10

## 2020-12-26 MED ORDER — ROSUVASTATIN CALCIUM 10 MG PO TABS
40.0000 mg | ORAL_TABLET | Freq: Every day | ORAL | Status: DC
  Administered 2020-12-26 – 2020-12-29 (×4): 40 mg via ORAL
  Filled 2020-12-26 (×4): qty 4

## 2020-12-26 NOTE — Care Management Important Message (Signed)
Important Message  Patient Details  Name: Mckenzie Taylor MRN: [id number] Date of Birth: 1940-03-22   Medicare Important Message Given:  N/A - LOS <3 / Initial given by admissions     Dannette Barbara 12/26/2020, 9:57 AM

## 2020-12-26 NOTE — Plan of Care (Signed)
Patient slept few hours, most of the time sat at edge of bed, stated had difficulty breathing while laying down.  Incision site to left wrist dressing dry intact, no bleeding , swelling or  hematoma noted. VSS, no BM. C/O nausea and given Zofran once. Monitored closely.  Problem: Health Behavior/Discharge Planning: Goal: Ability to manage health-related needs will improve Outcome: Progressing   Problem: Clinical Measurements: Goal: Ability to maintain clinical measurements within normal limits will improve Outcome: Progressing Goal: Will remain free from infection Outcome: Progressing Goal: Diagnostic test results will improve Outcome: Progressing Goal: Respiratory complications will improve Outcome: Progressing Goal: Cardiovascular complication will be avoided Outcome: Progressing   Problem: Activity: Goal: Risk for activity intolerance will decrease Outcome: Progressing   Problem: Elimination: Goal: Will not experience complications related to bowel motility Outcome: Progressing Goal: Will not experience complications related to urinary retention Outcome: Progressing   Problem: Pain Managment: Goal: General experience of comfort will improve Outcome: Progressing   Problem: Safety: Goal: Ability to remain free from injury will improve Outcome: Progressing   Problem: Skin Integrity: Goal: Risk for impaired skin integrity will decrease Outcome: Progressing   Problem: Education: Goal: Knowledge of General Education information will improve Description: Including pain rating scale, medication(s)/side effects and non-pharmacologic comfort measures Outcome: Progressing   Problem: Health Behavior/Discharge Planning: Goal: Ability to manage health-related needs will improve Outcome: Progressing   Problem: Clinical Measurements: Goal: Ability to maintain clinical measurements within normal limits will improve Outcome: Progressing Goal: Will remain free from  infection Outcome: Progressing Goal: Diagnostic test results will improve Outcome: Progressing Goal: Respiratory complications will improve Outcome: Progressing Goal: Cardiovascular complication will be avoided Outcome: Progressing   Problem: Activity: Goal: Risk for activity intolerance will decrease Outcome: Progressing   Problem: Nutrition: Goal: Adequate nutrition will be maintained Outcome: Progressing   Problem: Coping: Goal: Level of anxiety will decrease Outcome: Progressing   Problem: Elimination: Goal: Will not experience complications related to bowel motility Outcome: Progressing Goal: Will not experience complications related to urinary retention Outcome: Progressing   Problem: Pain Managment: Goal: General experience of comfort will improve Outcome: Progressing   Problem: Safety: Goal: Ability to remain free from injury will improve Outcome: Progressing   Problem: Skin Integrity: Goal: Risk for impaired skin integrity will decrease Outcome: Progressing

## 2020-12-26 NOTE — Progress Notes (Addendum)
Attending Note Patient seen and examined, agree with detailed note above,   Patient presentation and plan discussed on rounds.    EKG lab work, chest x-ray, echocardiogram reviewed independently by myself  Reports that she feels relatively well, denies shortness of breath Did not sleep very well No anginal symptoms Discussed cardiac catheterization performed yesterday stent placed to her ostial LAD  On examination : alert oriented, no JVD, lungs clear to auscultation bilaterally, heart sounds regular normal S1-S2 no murmurs appreciated, abdomen soft nontender no significant lower extremity edema.  Musculoskeletal exam with good range of motion, neurologic exam grossly nonfocal  Lab work reviewed sodium 136 potassium 4.8 creatinine higher 2.41 BUN higher 33 hemoglobin 9.5 WBC 9.3  -Repeat echocardiogram performed today  ejection fraction 30 to 35%, global hypokinesis, challenging images  A/P: Coronary artery disease with unstable angina Frequent readmissions for unstable angina symptoms, prior cardiac catheterization August 2022 Images reviewed with concern for proximal/ostial LAD disease, very hazy -Case discussed with interventional cardiology Dr. Fletcher Anon and Dr. Saunders Revel. Went for cardiac catheterization yesterday with Dr. Fletcher Anon, ago procedure but stent placed to ostial LAD for 80% lesio --Continue Coreg 6.25 twice daily, aspirin Plavix, Imdur 30, Crestor 40 Blood pressure running low, unable to titrate medications further -Not a good candidate for ACE/ARB/Entresto given renal dysfunction creatinine 2.1 -Room for hydralazine Consider adding SGLT2 inhibitor -Repeat echocardiogram performed today ejection fraction 30 to 35%, global hypokinesis, challenging images   Ischemic cardiomyopathy Plan for cardiac catheterization as above Ejection fraction 25%, July 2022 -Continue carvedilol, isosorbide, consider SGLT2 inhibitor  Chronic renal insufficiency Worsening numbers after contrast  yesterday, will continue to monitor In the setting of diabetes, cardiomyopathy, -Depending on renal function tomorrow, consider restarting torsemide 20 daily   Hyperlipidemia:  High intensity statin indicated, goal LDL less than 60   Essential hypertension Carvedilol, isosorbide  Borderline hypotensive   Case discussed with hospitalist service Greater than 50% was spent in counseling and coordination of care with patient Total encounter time 35 minutes or more   Signed: Esmond Plants  M.D., Ph.D. [facility]      Progress Note  Patient Name: Mckenzie Taylor Date of Encounter: 12/26/2020  [facility] Cardiologist: Ida Rogue, MD   Subjective   Patient is sleepy during interview. Denies chest pain or worsening SOB. Cath site stable.   Inpatient Medications    Scheduled Meds:  aspirin EC  81 mg Oral Daily   budesonide  0.25 mg Nebulization BID   carvedilol  6.25 mg Oral BID WC   clopidogrel  75 mg Oral Daily   heparin  5,000 Units Subcutaneous Q8H   insulin aspart  0-9 Units Subcutaneous TID WC   isosorbide mononitrate  30 mg Oral Daily   melatonin  5 mg Oral QHS   rosuvastatin  10 mg Oral Daily   sodium chloride flush  3 mL Intravenous Q12H   traZODone  50 mg Oral QHS   umeclidinium-vilanterol  1 puff Inhalation Daily   Continuous Infusions:  sodium chloride     PRN Meds: sodium chloride, acetaminophen, nitroGLYCERIN, ondansetron (ZOFRAN) IV, sodium chloride flush   Vital Signs    Vitals:   12/25/20 1633 12/25/20 1947 12/25/20 2048 12/26/20 0442  BP: 105/85 111/85 113/84   Pulse: 100 (!) 103 78   Resp: (!) 24 20 20    Temp:  97.9 F (36.6 C)    TempSrc:      SpO2: 96% 96%    Weight:    88.8 kg  Height:        Intake/Output Summary (Last 24 hours) at 12/26/2020 0747 Last data filed at 12/25/2020 1800 Gross per 24 hour  Intake 115 ml  Output --  Net 115 ml   Last 3 Weights 12/26/2020 12/24/2020 11/30/2020  Weight (lbs) 195 lb 12.8 oz  194 lb 197 lb 1.6 oz  Weight (kg) 88.814 kg 87.998 kg 89.404 kg      Telemetry    NSR HR 90s, possible prolonged QtC - Personally Reviewed  ECG    NSR 90bpm, Qtc 581ms, LAD, nonspecific T wave changes - Personally Reviewed  Physical Exam   GEN: No acute distress.   Neck: No JVD Cardiac: RRR, no murmurs, rubs, or gallops.  Respiratory: diminished GI: Soft, nontender, non-distended  MS: No edema; No deformity. Neuro:  Nonfocal  Psych: Normal affect   Labs    High Sensitivity Troponin:   Recent Labs  Lab 11/27/20 0831 11/27/20 1013 12/24/20 1519 12/24/20 1730  TROPONINIHS 36* 42* 48* 57*     Chemistry Recent Labs  Lab 12/24/20 1519 12/26/20 0419  NA 140 136  K 4.1 4.8  CL 103 103  CO2 27 26  GLUCOSE 181* 141*  BUN 27* 33*  CREATININE 2.12* 2.41*  CALCIUM 10.8* 9.4  GFRNONAA 23* 20*  ANIONGAP 10 7    Lipids No results for input(s): CHOL, TRIG, HDL, LABVLDL, LDLCALC, CHOLHDL in the last 168 hours.  Hematology Recent Labs  Lab 12/24/20 1519 12/26/20 0419  WBC 8.5 9.3  RBC 3.66* 3.41*  HGB 10.2* 9.5*  HCT 32.7* 31.3*  MCV 89.3 91.8  MCH 27.9 27.9  MCHC 31.2 30.4  RDW 16.8* 16.7*  PLT 361 330   Thyroid  Recent Labs  Lab 12/24/20 2325  TSH 1.677  FREET4 0.80    BNP Recent Labs  Lab 12/24/20 1519  BNP 944.1*    DDimer No results for input(s): DDIMER in the last 168 hours.   Radiology    DG Chest 2 View  Result Date: 12/24/2020 CLINICAL DATA:  Chest pain EXAM: CHEST - 2 VIEW COMPARISON:  Chest x-ray 11/27/2020, CT chest 11/27/2020 FINDINGS: The heart and mediastinal contours are unchanged with persistent small pericardial effusion. Aortic calcification. Bilateral lower lobe streaky airspace opacities. No pulmonary edema. Likely trace bilateral pleural effusions. No pneumothorax. No acute osseous abnormality. IMPRESSION: 1. Persistent likely small volume pericardial effusion. 2. Trace bilateral pleural effusions not excluded. 3. Bilateral  lower lobe streaky airspace opacities. Findings may represent a combination of infection/invasion versus atelectasis. Electronically Signed   By: Iven Finn M.D.   On: 12/24/2020 17:25   CARDIAC CATHETERIZATION  Result Date: 12/25/2020   Ost LM to Mid LM lesion is 40% stenosed.   Dist LAD lesion is 80% stenosed.   Ost Cx to Prox Cx lesion is 30% stenosed.   Ramus lesion is 40% stenosed.   1st Diag lesion is 100% stenosed.   Ost LAD to Prox LAD lesion is 80% stenosed.   Prox LAD lesion is 60% stenosed.   A drug-eluting stent was successfully placed using a STENT ONYX FRONTIER 3.0X26.   Post intervention, there is a 0% residual stenosis.   Post intervention, there is a 0% residual stenosis. 1.  Significant ostial and proximal LAD disease.  This was significant by IVUS with a minimal luminal area of 3.5 cm.  Mild ramus and left circumflex disease.  The left circumflex is dominant. 2.  Moderately elevated left ventricular end-diastolic pressure at 22 mmHg. 3.  Successful angioplasty and drug-eluting stent placement to the proximal/ostial LAD with 1 stent. Recommendations: Dual antiplatelet therapy for at least 12 months. Aggressive treatment of risk factors.  Gentle hydration for 6 to 8 hours to prevent contrast-induced nephropathy given significant underlying chronic kidney disease.  The procedure was overall difficult and required 120 mL of contrast.    Cardiac Studies   Cardiac cath 12/25/20     Ost LM to Mid LM lesion is 40% stenosed.   Dist LAD lesion is 80% stenosed.   Ost Cx to Prox Cx lesion is 30% stenosed.   Ramus lesion is 40% stenosed.   1st Diag lesion is 100% stenosed.   Ost LAD to Prox LAD lesion is 80% stenosed.   Prox LAD lesion is 60% stenosed.   A drug-eluting stent was successfully placed using a STENT ONYX FRONTIER 3.0X26.   Post intervention, there is a 0% residual stenosis.   Post intervention, there is a 0% residual stenosis.   1.  Significant ostial and proximal LAD  disease.  This was significant by IVUS with a minimal luminal area of 3.5 cm.  Mild ramus and left circumflex disease.  The left circumflex is dominant. 2.  Moderately elevated left ventricular end-diastolic pressure at 22 mmHg. 3.  Successful angioplasty and drug-eluting stent placement to the proximal/ostial LAD with 1 stent.   Recommendations: Dual antiplatelet therapy for at least 12 months. Aggressive treatment of risk factors.  Gentle hydration for 6 to 8 hours to prevent contrast-induced nephropathy given significant underlying chronic kidney disease.  The procedure was overall difficult and required 120 mL of contrast.   Coronary Diagrams  Diagnostic Dominance: Left Intervention    Echo  pending   Echo 09/2020  1. Left ventricular ejection fraction, by estimation, is 25 to 30%. The  left ventricle has severely decreased function. The left ventricle  demonstrates regional wall motion abnormalities (see scoring  diagram/findings for description). The left  ventricular internal cavity size was mildly dilated. There is mild left  ventricular hypertrophy. Left ventricular diastolic parameters are  indeterminate. There is severe hypokinesis of the left ventricular, entire  anteroseptal wall, anterior wall and  anterolateral wall. There is severe hypokinesis of the left ventricular,  apical inferior segment and apical segment.   2. Right ventricular systolic function is normal. The right ventricular  size is normal. Moderately increased right ventricular wall thickness.  Tricuspid regurgitation signal is inadequate for assessing PA pressure.   3. A small pericardial effusion is present. The pericardial effusion is  anterior to the right ventricle.   4. The mitral valve is abnormal. No evidence of mitral valve  regurgitation. Moderate mitral annular calcification.   5. The aortic valve was not well visualized. Aortic valve regurgitation  is not visualized. No aortic stenosis is  present.   Patient Profile     80 y.o. female with hx of DM2, COPD, ICM, 3V CAD August 2022 medical management recommended at that time, presenting  Assessment & Plan    CAD with unstable angina - Frequent readmission for UA symptoms prior cardiac cath August 2022. Review of images by interventionalist felt intervention was needed.  - cath showed significant ostial and proximal LAD disease with mild ramus an dLCx disease, treated with successful angioplasty and DES to the p/ostial LAD with 1 stent. LVEDP was 78mmHg.  - Plan for DAPT with ASA and Plavix for at least 12 months - continue coreg 6.25mg BID, - continue Imdur - No ACE/ARB for CKD - Cath  site stable. She denies further chest pain  ICM - EF 25% - repeat echo ordered - cath as above - continue Coreg, Imdur - consider adding hydralazine, however intermittent soft bps preventing this - elevated LVEDP on cath, consider low dose lasix  CKD - Scr 2.12>2.41. continue to monitor post-cath  HLD - LDL 142, 09/2020 - Continue Crestor>>will increase  HTN - BPS intermittently soft - continue coreg and Imdur  For questions or updates, please contact [place] Please consult [email or web address] for contact info under        Signed, Cadence Ninfa Meeker, PA-C  12/26/2020, 7:47 AM

## 2020-12-26 NOTE — Progress Notes (Signed)
PROGRESS NOTE    Mckenzie Taylor  ZHG:[id number] DOB: [birth year] DOA: 12/24/2020 PCP: Denton Lank, MD    Brief Narrative:  Mckenzie Taylor is a 80 y.o. female seen in ed with complaints of chest pain, two days ago. She was out of her NTG.located in middle of chest travelling to both arm, and center of her back.   10/13 s/p cath today. Pt was seen in recovery room 10/14 cr up 2.41 .  Consultants:  cardiology  Procedures:   Antimicrobials:     Subjective: Feels better, but if lays on her right side becomes sob  Objective: Vitals:   12/25/20 1947 12/25/20 2048 12/26/20 0442 12/26/20 0846  BP: 111/85 113/84  (!) 81/53  Pulse: (!) 103 78  88  Resp: 20 20  20   Temp: 97.9 F (36.6 C)   97.8 F (36.6 C)  TempSrc:    Oral  SpO2: 96%   97%  Weight:   88.8 kg   Height:        Intake/Output Summary (Last 24 hours) at 12/26/2020 0905 Last data filed at 12/26/2020 0849 Gross per 24 hour  Intake 235 ml  Output --  Net 235 ml   Filed Weights   12/24/20 1519 12/26/20 0442  Weight: 88 kg 88.8 kg    Examination: Nad, calm Increase exp time, cta no wheezing Regular s1/s2 no gallop Soft benign +bs No edema [id number]   Data Reviewed: I have personally reviewed following labs and imaging studies  CBC: Recent Labs  Lab 12/24/20 1519 12/26/20 0419  WBC 8.5 9.3  HGB 10.2* 9.5*  HCT 32.7* 31.3*  MCV 89.3 91.8  PLT 361 196   Basic Metabolic Panel: Recent Labs  Lab 12/24/20 1519 12/26/20 0419  NA 140 136  K 4.1 4.8  CL 103 103  CO2 27 26  GLUCOSE 181* 141*  BUN 27* 33*  CREATININE 2.12* 2.41*  CALCIUM 10.8* 9.4   GFR: Estimated Creatinine Clearance: 21.2 mL/min (A) (by C-G formula based on SCr of 2.41 mg/dL (H)). Liver Function Tests: No results for input(s): AST, ALT, ALKPHOS, BILITOT, PROT, ALBUMIN in the last 168 hours. No results for input(s): LIPASE, AMYLASE in the last 168 hours. No results for input(s): AMMONIA in the last 168  hours. Coagulation Profile: No results for input(s): INR, PROTIME in the last 168 hours. Cardiac Enzymes: No results for input(s): CKTOTAL, CKMB, CKMBINDEX, TROPONINI in the last 168 hours. BNP (last 3 results) No results for input(s): PROBNP in the last 8760 hours. HbA1C: Recent Labs    12/24/20 2325  HGBA1C 8.4*   CBG: Recent Labs  Lab 12/25/20 0755 12/25/20 1152 12/25/20 1737 12/25/20 2042 12/26/20 0853  GLUCAP 149* 131* 179* 144* 138*   Lipid Profile: No results for input(s): CHOL, HDL, LDLCALC, TRIG, CHOLHDL, LDLDIRECT in the last 72 hours. Thyroid Function Tests: Recent Labs    12/24/20 2325  TSH 1.677  FREET4 0.80   Anemia Panel: No results for input(s): VITAMINB12, FOLATE, FERRITIN, TIBC, IRON, RETICCTPCT in the last 72 hours. Sepsis Labs: Recent Labs  Lab 12/24/20 1721  PROCALCITON 0.22    Recent Results (from the past 240 hour(s))  Resp Panel by RT-PCR (Flu A&B, Covid) Nasopharyngeal Swab     Status: None   Collection Time: 12/24/20  8:02 PM   Specimen: Nasopharyngeal Swab; Nasopharyngeal(NP) swabs in vial transport medium  Result Value Ref Range Status   SARS Coronavirus 2 by RT PCR NEGATIVE NEGATIVE Final    Comment: (NOTE)  SARS-CoV-2 target nucleic acids are NOT DETECTED.  The SARS-CoV-2 RNA is generally detectable in upper respiratory specimens during the acute phase of infection. The lowest concentration of SARS-CoV-2 viral copies this assay can detect is 138 copies/mL. A negative result does not preclude SARS-Cov-2 infection and should not be used as the sole basis for treatment or other patient management decisions. A negative result may occur with  improper specimen collection/handling, submission of specimen other than nasopharyngeal swab, presence of viral mutation(s) within the areas targeted by this assay, and inadequate number of viral copies(<138 copies/mL). A negative result must be combined with clinical observations, patient  history, and epidemiological information. The expected result is Negative.  Fact Sheet for Patients:  EntrepreneurPulse.com.au  Fact Sheet for Healthcare Providers:  IncredibleEmployment.be  This test is no t yet approved or cleared by the [place] FDA and  has been authorized for detection and/or diagnosis of SARS-CoV-2 by FDA under an Emergency Use Authorization (EUA). This EUA will remain  in effect (meaning this test can be used) for the duration of the COVID-19 declaration under Section 564(b)(1) of the Act, 21 U.S.C.section 360bbb-3(b)(1), unless the authorization is terminated  or revoked sooner.       Influenza A by PCR NEGATIVE NEGATIVE Final   Influenza B by PCR NEGATIVE NEGATIVE Final    Comment: (NOTE) The Xpert Xpress SARS-CoV-2/FLU/RSV plus assay is intended as an aid in the diagnosis of influenza from Nasopharyngeal swab specimens and should not be used as a sole basis for treatment. Nasal washings and aspirates are unacceptable for Xpert Xpress SARS-CoV-2/FLU/RSV testing.  Fact Sheet for Patients: EntrepreneurPulse.com.au  Fact Sheet for Healthcare Providers: IncredibleEmployment.be  This test is not yet approved or cleared by the [place] FDA and has been authorized for detection and/or diagnosis of SARS-CoV-2 by FDA under an Emergency Use Authorization (EUA). This EUA will remain in effect (meaning this test can be used) for the duration of the COVID-19 declaration under Section 564(b)(1) of the Act, 21 U.S.C. section 360bbb-3(b)(1), unless the authorization is terminated or revoked.  Performed at [facility], [address], [place], [place] [address]          Radiology Studies: DG Chest 2 View  Result Date: 12/24/2020 CLINICAL DATA:  Chest pain EXAM: CHEST - 2 VIEW COMPARISON:  Chest x-ray 11/27/2020, CT chest 11/27/2020 FINDINGS: The heart and  mediastinal contours are unchanged with persistent small pericardial effusion. Aortic calcification. Bilateral lower lobe streaky airspace opacities. No pulmonary edema. Likely trace bilateral pleural effusions. No pneumothorax. No acute osseous abnormality. IMPRESSION: 1. Persistent likely small volume pericardial effusion. 2. Trace bilateral pleural effusions not excluded. 3. Bilateral lower lobe streaky airspace opacities. Findings may represent a combination of infection/invasion versus atelectasis. Electronically Signed   By: Iven Finn M.D.   On: 12/24/2020 17:25   CARDIAC CATHETERIZATION  Result Date: 12/25/2020   Ost LM to Mid LM lesion is 40% stenosed.   Dist LAD lesion is 80% stenosed.   Ost Cx to Prox Cx lesion is 30% stenosed.   Ramus lesion is 40% stenosed.   1st Diag lesion is 100% stenosed.   Ost LAD to Prox LAD lesion is 80% stenosed.   Prox LAD lesion is 60% stenosed.   A drug-eluting stent was successfully placed using a STENT ONYX FRONTIER 3.0X26.   Post intervention, there is a 0% residual stenosis.   Post intervention, there is a 0% residual stenosis. 1.  Significant ostial and proximal LAD disease.  This was significant by IVUS with a minimal luminal area of 3.5 cm.  Mild ramus and left circumflex disease.  The left circumflex is dominant. 2.  Moderately elevated left ventricular end-diastolic pressure at 22 mmHg. 3.  Successful angioplasty and drug-eluting stent placement to the proximal/ostial LAD with 1 stent. Recommendations: Dual antiplatelet therapy for at least 12 months. Aggressive treatment of risk factors.  Gentle hydration for 6 to 8 hours to prevent contrast-induced nephropathy given significant underlying chronic kidney disease.  The procedure was overall difficult and required 120 mL of contrast.        Scheduled Meds:  aspirin EC  81 mg Oral Daily   budesonide  0.25 mg Nebulization BID   carvedilol  6.25 mg Oral BID WC   clopidogrel  75 mg Oral Daily    heparin  5,000 Units Subcutaneous Q8H   insulin aspart  0-9 Units Subcutaneous TID WC   isosorbide mononitrate  30 mg Oral Daily   melatonin  5 mg Oral QHS   rosuvastatin  40 mg Oral Daily   sodium chloride flush  3 mL Intravenous Q12H   traZODone  50 mg Oral QHS   umeclidinium-vilanterol  1 puff Inhalation Daily   Continuous Infusions:  sodium chloride      Assessment & Plan:   Principal Problem:   Chest pain Active Problems:   GERD (gastroesophageal reflux disease)   HTN (hypertension)   Diabetes type 2, controlled (HCC)   CKD (chronic kidney disease), stage IV (HCC)   CAD (coronary artery disease)   Hypothyroidism   Unstable angina ([place])   Unstable angina with CAD S/p cath today found with signifi. Ostial and prox LAD dz. S/p DES to prox/ostial LAD.  Continue Imdur, beta blk, asa, statin, and plavix 10/14-cardiology following Repeat echo EF 30 to 35%, global hypokinesis Not a good candidate ACE/ARB/Entresto given renal dysfunction Consider adding SGL T2 inhibitor    2. HTN-  Normotensive to low Continue isosorbide and carvedilol  3. DL- continue statin Goal LDL less than 70    4.DMII-  Continue R-ISS  5.AKI on CKD IV Creatinine up to 2.4 Avoid nephrotoxic meds Did receive gentle hydration post cath, encourage p.o. intake Monitor renal function  6.ICM EF 25%-35% Continue coreg, IM S/p cath Hold torsemide since went for cath. 10/14 echo as above Continue I's and O's, daily weight Continue carvedilol, isosorbide, consider SGLT2 inhibitor      DVT prophylaxis: heparin Code Status:full Family Communication: none at bedside Disposition Plan:  Status is: Inpatient  Remains inpatient appropriate because:Inpatient level of care appropriate due to severity of illness   Dispo: The patient is from: Home              Anticipated d/c is to: Home              Patient currently is not medically stable to d/c.              Difficult to place patient  No    Renal function worsening if stabilizes possible DC in a.m.        LOS: 2 days   Time spent:45 min with >50% on coc    Nolberto Hanlon, MD [facility] Pager [phone]  If 7PM-7AM, please contact night-coverage 12/26/2020, 9:05 AM

## 2020-12-26 NOTE — Progress Notes (Signed)
Mobility Specialist - Progress Note   12/26/20 1554  Mobility  Activity Refused mobility  Mobility performed by Mobility specialist    Pt sleeping on arrival, did not awake to voice/touch. Unable to proceed with session at this time. Will attempt another date.   Kathee Delton Mobility Specialist 12/26/20, 3:54 PM

## 2020-12-26 NOTE — Progress Notes (Signed)
*  PRELIMINARY RESULTS* Echocardiogram 2D Echocardiogram has been performed.  Mckenzie Taylor 12/26/2020, 11:47 AM

## 2020-12-27 DIAGNOSIS — I10 Essential (primary) hypertension: Secondary | ICD-10-CM | POA: Diagnosis not present

## 2020-12-27 DIAGNOSIS — R079 Chest pain, unspecified: Secondary | ICD-10-CM | POA: Diagnosis not present

## 2020-12-27 DIAGNOSIS — I200 Unstable angina: Secondary | ICD-10-CM | POA: Diagnosis not present

## 2020-12-27 DIAGNOSIS — N184 Chronic kidney disease, stage 4 (severe): Secondary | ICD-10-CM | POA: Diagnosis not present

## 2020-12-27 DIAGNOSIS — I25110 Atherosclerotic heart disease of native coronary artery with unstable angina pectoris: Secondary | ICD-10-CM | POA: Diagnosis not present

## 2020-12-27 LAB — BASIC METABOLIC PANEL
Anion gap: 9 (ref 5–15)
BUN: 38 mg/dL — ABNORMAL HIGH (ref 8–23)
CO2: 26 mmol/L (ref 22–32)
Calcium: 9.2 mg/dL (ref 8.9–10.3)
Chloride: 104 mmol/L (ref 98–111)
Creatinine, Ser: 2.83 mg/dL — ABNORMAL HIGH (ref 0.44–1.00)
GFR, Estimated: 17 mL/min — ABNORMAL LOW (ref >60)
Glucose, Bld: 137 mg/dL — ABNORMAL HIGH (ref 70–99)
Potassium: 5.3 mmol/L — ABNORMAL HIGH (ref 3.5–5.1)
Sodium: 139 mmol/L (ref 135–145)

## 2020-12-27 LAB — GLUCOSE, CAPILLARY
Glucose-Capillary: 150 mg/dL — ABNORMAL HIGH (ref 70–99)
Glucose-Capillary: 171 mg/dL — ABNORMAL HIGH (ref 70–99)
Glucose-Capillary: 147 mg/dL — ABNORMAL HIGH (ref 70–99)
Glucose-Capillary: 128 mg/dL — ABNORMAL HIGH (ref 70–99)

## 2020-12-27 MED ORDER — CALCIUM CARBONATE ANTACID 500 MG PO CHEW
1.0000 | CHEWABLE_TABLET | Freq: Four times a day (QID) | ORAL | Status: DC | PRN
  Administered 2020-12-27 – 2020-12-28 (×2): 200 mg via ORAL
  Filled 2020-12-27 (×2): qty 1

## 2020-12-27 MED ORDER — FAMOTIDINE 20 MG PO TABS
20.0000 mg | ORAL_TABLET | Freq: Every day | ORAL | Status: DC
  Administered 2020-12-27 – 2020-12-29 (×3): 20 mg via ORAL
  Filled 2020-12-27 (×3): qty 1

## 2020-12-27 MED ORDER — IPRATROPIUM-ALBUTEROL 0.5-2.5 (3) MG/3ML IN SOLN
3.0000 mL | RESPIRATORY_TRACT | Status: DC | PRN
  Administered 2020-12-27: 3 mL via RESPIRATORY_TRACT
  Filled 2020-12-27: qty 3

## 2020-12-27 MED ORDER — CALCIUM CARBONATE ANTACID 500 MG PO CHEW: 1.0000 | CHEWABLE_TABLET | Freq: Three times a day (TID) | ORAL | Status: DC

## 2020-12-27 MED ORDER — ALUM & MAG HYDROXIDE-SIMETH 200-200-20 MG/5ML PO SUSP
30.0000 mL | Freq: Once | ORAL | Status: AC
  Administered 2020-12-27: 30 mL via ORAL
  Filled 2020-12-27: qty 30

## 2020-12-27 MED ORDER — SODIUM POLYSTYRENE SULFONATE 15 GM/60ML PO SUSP
15.0000 g | Freq: Once | ORAL | Status: AC
  Administered 2020-12-27: 15 g via ORAL
  Filled 2020-12-27: qty 60

## 2020-12-27 MED ORDER — SODIUM CHLORIDE 0.9 % IV SOLN: INTRAVENOUS | Status: DC

## 2020-12-27 NOTE — Progress Notes (Signed)
PROGRESS NOTE    Mckenzie Taylor  JQZ:[id number] DOB: 1941/01/27 DOA: 12/24/2020 PCP: Denton Lank, MD    Brief Narrative:  Mckenzie Taylor is a 80 y.o. female seen in ed with complaints of chest pain, two days ago. She was out of her NTG.located in middle of chest travelling to both arm, and center of her back.   10/13 s/p cath today. Pt was seen in recovery room 10/14 cr up 2.41 . 10/15 creatinine increased to 2.83.  Nephrology consulted by me this AM.  Consultants:  cardiology  Procedures:   Antimicrobials:     Subjective: With shortness of breath but not worse than previous, no chest pain this AM  Objective: Vitals:   12/26/20 2103 12/27/20 0031 12/27/20 0501 12/27/20 0751  BP: (!) 134/59 (!) 145/84 120/66 126/60  Pulse: 93 94 89 84  Resp: 18 20 15 18   Temp: 97.8 F (36.6 C) 97.6 F (36.4 C) 98.7 F (37.1 C) 98.2 F (36.8 C)  TempSrc: Oral Oral    SpO2: 99% 97% 100% 100%  Weight:      Height:        Intake/Output Summary (Last 24 hours) at 12/27/2020 0940 Last data filed at 12/26/2020 1807 Gross per 24 hour  Intake 600 ml  Output --  Net 600 ml   Filed Weights   12/24/20 1519 12/26/20 0442  Weight: 88 kg 88.8 kg    Examination: Sitting in bed Increased expiratory time, no rales, no wheezing Regular S1-S2 no gallops Soft benign positive bowel sounds Positive edema Alert and oriented x3   Data Reviewed: I have personally reviewed following labs and imaging studies  CBC: Recent Labs  Lab 12/24/20 1519 12/26/20 0419  WBC 8.5 9.3  HGB 10.2* 9.5*  HCT 32.7* 31.3*  MCV 89.3 91.8  PLT 361 622   Basic Metabolic Panel: Recent Labs  Lab 12/24/20 1519 12/26/20 0419 12/27/20 0639  NA 140 136 139  K 4.1 4.8 5.3*  CL 103 103 104  CO2 27 26 26   GLUCOSE 181* 141* 137*  BUN 27* 33* 38*  CREATININE 2.12* 2.41* 2.83*  CALCIUM 10.8* 9.4 9.2   GFR: Estimated Creatinine Clearance: 18 mL/min (A) (by C-G formula based on SCr of 2.83 mg/dL  (H)). Liver Function Tests: No results for input(s): AST, ALT, ALKPHOS, BILITOT, PROT, ALBUMIN in the last 168 hours. No results for input(s): LIPASE, AMYLASE in the last 168 hours. No results for input(s): AMMONIA in the last 168 hours. Coagulation Profile: No results for input(s): INR, PROTIME in the last 168 hours. Cardiac Enzymes: No results for input(s): CKTOTAL, CKMB, CKMBINDEX, TROPONINI in the last 168 hours. BNP (last 3 results) No results for input(s): PROBNP in the last 8760 hours. HbA1C: Recent Labs    12/24/20 2325  HGBA1C 8.4*   CBG: Recent Labs  Lab 12/26/20 0853 12/26/20 1115 12/26/20 1642 12/26/20 2121 12/27/20 0749  GLUCAP 138* 188* 131* 159* 150*   Lipid Profile: No results for input(s): CHOL, HDL, LDLCALC, TRIG, CHOLHDL, LDLDIRECT in the last 72 hours. Thyroid Function Tests: Recent Labs    12/24/20 2325  TSH 1.677  FREET4 0.80   Anemia Panel: No results for input(s): VITAMINB12, FOLATE, FERRITIN, TIBC, IRON, RETICCTPCT in the last 72 hours. Sepsis Labs: Recent Labs  Lab 12/24/20 1721  PROCALCITON 0.22    Recent Results (from the past 240 hour(s))  Resp Panel by RT-PCR (Flu A&B, Covid) Nasopharyngeal Swab     Status: None   Collection Time:  12/24/20  8:02 PM   Specimen: Nasopharyngeal Swab; Nasopharyngeal(NP) swabs in vial transport medium  Result Value Ref Range Status   SARS Coronavirus 2 by RT PCR NEGATIVE NEGATIVE Final    Comment: (NOTE) SARS-CoV-2 target nucleic acids are NOT DETECTED.  The SARS-CoV-2 RNA is generally detectable in upper respiratory specimens during the acute phase of infection. The lowest concentration of SARS-CoV-2 viral copies this assay can detect is 138 copies/mL. A negative result does not preclude SARS-Cov-2 infection and should not be used as the sole basis for treatment or other patient management decisions. A negative result may occur with  improper specimen collection/handling, submission of specimen  other than nasopharyngeal swab, presence of viral mutation(s) within the areas targeted by this assay, and inadequate number of viral copies(<138 copies/mL). A negative result must be combined with clinical observations, patient history, and epidemiological information. The expected result is Negative.  Fact Sheet for Patients:  EntrepreneurPulse.com.au  Fact Sheet for Healthcare Providers:  IncredibleEmployment.be  This test is no t yet approved or cleared by the [place] FDA and  has been authorized for detection and/or diagnosis of SARS-CoV-2 by FDA under an Emergency Use Authorization (EUA). This EUA will remain  in effect (meaning this test can be used) for the duration of the COVID-19 declaration under Section 564(b)(1) of the Act, 21 U.S.C.section 360bbb-3(b)(1), unless the authorization is terminated  or revoked sooner.       Influenza A by PCR NEGATIVE NEGATIVE Final   Influenza B by PCR NEGATIVE NEGATIVE Final    Comment: (NOTE) The Xpert Xpress SARS-CoV-2/FLU/RSV plus assay is intended as an aid in the diagnosis of influenza from Nasopharyngeal swab specimens and should not be used as a sole basis for treatment. Nasal washings and aspirates are unacceptable for Xpert Xpress SARS-CoV-2/FLU/RSV testing.  Fact Sheet for Patients: EntrepreneurPulse.com.au  Fact Sheet for Healthcare Providers: IncredibleEmployment.be  This test is not yet approved or cleared by the [place] FDA and has been authorized for detection and/or diagnosis of SARS-CoV-2 by FDA under an Emergency Use Authorization (EUA). This EUA will remain in effect (meaning this test can be used) for the duration of the COVID-19 declaration under Section 564(b)(1) of the Act, 21 U.S.C. section 360bbb-3(b)(1), unless the authorization is terminated or revoked.  Performed at [facility], [address],  [place], [place] [address]          Radiology Studies: CARDIAC CATHETERIZATION  Result Date: 12/25/2020   Ost LM to Mid LM lesion is 40% stenosed.   Dist LAD lesion is 80% stenosed.   Ost Cx to Prox Cx lesion is 30% stenosed.   Ramus lesion is 40% stenosed.   1st Diag lesion is 100% stenosed.   Ost LAD to Prox LAD lesion is 80% stenosed.   Prox LAD lesion is 60% stenosed.   A drug-eluting stent was successfully placed using a STENT ONYX FRONTIER [id number].   Post intervention, there is a 0% residual stenosis.   Post intervention, there is a 0% residual stenosis. 1.  Significant ostial and proximal LAD disease.  This was significant by IVUS with a minimal luminal area of 3.5 cm.  Mild ramus and left circumflex disease.  The left circumflex is dominant. 2.  Moderately elevated left ventricular end-diastolic pressure at 22 mmHg. 3.  Successful angioplasty and drug-eluting stent placement to the proximal/ostial LAD with 1 stent. Recommendations: Dual antiplatelet therapy for at least 12 months. Aggressive treatment of risk factors.  Gentle hydration for 6  to 8 hours to prevent contrast-induced nephropathy given significant underlying chronic kidney disease.  The procedure was overall difficult and required 120 mL of contrast.   ECHOCARDIOGRAM COMPLETE  Result Date: 12/26/2020    ECHOCARDIOGRAM REPORT   Patient Name:   Mckenzie Taylor Date of Exam: 12/26/2020 Medical Rec #:  [id number]         Height:       65.0 in Accession #:    [id number]        Weight:       195.8 lb Date of Birth:  1940/04/04        BSA:          1.960 m Patient Age:    71 years          BP:           81/53 mmHg Patient Gender: F                 HR:           101 bpm. Exam Location:  [facility] Procedure: 2D Echo, Color Doppler, Cardiac Doppler and Intracardiac            Opacification Agent Indications:     R07.9 Chest Pain  History:         Patient has prior history of Echocardiogram examinations, most                  recent 09/16/2020. CHF,  CAD, CKD and COPD; Risk                  Factors:Hypertension, Dyslipidemia, Diabetes and Former Smoker.  Sonographer:     Charmayne Sheer Referring Phys:  [place] Diagnosing Phys: Ida Rogue MD  Sonographer Comments: Technically difficult study due to poor echo windows. Image acquisition challenging due to COPD. IMPRESSIONS  1. Challenging images  2. Left ventricular ejection fraction, by estimation, is 30 to 35%. The left ventricle has moderately decreased function. The left ventricle demonstrates global hypokinesis. The left ventricular internal cavity size was mildly dilated. There is mild left ventricular hypertrophy. Left ventricular diastolic parameters are indeterminate.  3. Right ventricular systolic function is normal. The right ventricular size is normal. Tricuspid regurgitation signal is inadequate for assessing PA pressure.  4. A small pericardial effusion is present. FINDINGS  Left Ventricle: Left ventricular ejection fraction, by estimation, is 30 to 35%. The left ventricle has moderately decreased function. The left ventricle demonstrates global hypokinesis. Definity contrast agent was given IV to delineate the left ventricular endocardial borders. The left ventricular internal cavity size was mildly dilated. There is mild left ventricular hypertrophy. Left ventricular diastolic parameters are indeterminate. Right Ventricle: The right ventricular size is normal. No increase in right ventricular wall thickness. Right ventricular systolic function is normal. Tricuspid regurgitation signal is inadequate for assessing PA pressure. Left Atrium: Left atrial size was normal in size. Right Atrium: Right atrial size was normal in size. Pericardium: A small pericardial effusion is present. Mitral Valve: The mitral valve is normal in structure. Moderate mitral annular calcification. No evidence of mitral valve regurgitation. No evidence of mitral valve stenosis. MV peak gradient, 11.0 mmHg. The  mean mitral valve gradient is 4.0 mmHg. Tricuspid Valve: The tricuspid valve is normal in structure. Tricuspid valve regurgitation is not demonstrated. No evidence of tricuspid stenosis. Aortic Valve: The aortic valve was not well visualized. Aortic valve regurgitation is not visualized. No aortic stenosis is present. Aortic valve mean gradient measures  6.0 mmHg. Aortic valve peak gradient measures 9.4 mmHg. Aortic valve area, by VTI measures 2.84 cm. Pulmonic Valve: The pulmonic valve was normal in structure. Pulmonic valve regurgitation is not visualized. No evidence of pulmonic stenosis. Aorta: The aortic root is normal in size and structure. Venous: The pulmonary veins were not well visualized. The inferior vena cava was not well visualized. The inferior vena cava is normal in size with greater than 50% respiratory variability, suggesting right atrial pressure of 3 mmHg. IAS/Shunts: No atrial level shunt detected by color flow Doppler.  LEFT VENTRICLE PLAX 2D LVIDd:         4.40 cm     Diastology LVIDs:         3.90 cm     LV e' medial:    11.50 cm/s LV PW:         1.50 cm     LV E/e' medial:  13.4 LV IVS:        1.10 cm     LV e' lateral:   11.30 cm/s LVOT diam:     2.20 cm     LV E/e' lateral: 13.6 LV SV:         85 LV SV Index:   43 LVOT Area:     3.80 cm  LV Volumes (MOD) LV vol d, MOD A2C: 90.3 ml LV vol d, MOD A4C: 95.9 ml LV vol s, MOD A2C: 55.2 ml LV vol s, MOD A4C: 60.4 ml LV SV MOD A2C:     35.1 ml LV SV MOD A4C:     95.9 ml LV SV MOD BP:      39.0 ml LEFT ATRIUM             Index LA diam:        4.40 cm 2.24 cm/m LA Vol (A2C):   28.0 ml 14.28 ml/m LA Vol (A4C):   41.3 ml 21.07 ml/m LA Biplane Vol: 33.9 ml 17.29 ml/m  AORTIC VALVE                     PULMONIC VALVE AV Area (Vmax):    3.30 cm      PV Vmax:       0.90 m/s AV Area (Vmean):   2.99 cm      PV Vmean:      65.600 cm/s AV Area (VTI):     2.84 cm      PV VTI:        0.145 m AV Vmax:           153.00 cm/s   PV Peak grad:  3.2 mmHg AV  Vmean:          114.000 cm/s  PV Mean grad:  2.0 mmHg AV VTI:            0.298 m AV Peak Grad:      9.4 mmHg AV Mean Grad:      6.0 mmHg LVOT Vmax:         133.00 cm/s LVOT Vmean:        89.700 cm/s LVOT VTI:          0.223 m LVOT/AV VTI ratio: 0.75  AORTA Ao Root diam: 2.70 cm MITRAL VALVE MV Area (PHT): 5.97 cm     SHUNTS MV Area VTI:   2.97 cm     Systemic VTI:  0.22 m MV Peak grad:  11.0 mmHg    Systemic Diam: 2.20 cm MV Mean  grad:  4.0 mmHg MV Vmax:       1.66 m/s MV Vmean:      95.2 cm/s MV Decel Time: 127 msec MV E velocity: 153.67 cm/s Ida Rogue MD Electronically signed by Ida Rogue MD Signature Date/Time: 12/26/2020/2:47:43 PM    Final         Scheduled Meds:  aspirin EC  81 mg Oral Daily   budesonide  0.25 mg Nebulization BID   carvedilol  6.25 mg Oral BID WC   clopidogrel  75 mg Oral Daily   heparin  5,000 Units Subcutaneous Q8H   insulin aspart  0-9 Units Subcutaneous TID WC   isosorbide mononitrate  30 mg Oral Daily   melatonin  5 mg Oral QHS   rosuvastatin  40 mg Oral Daily   sodium chloride flush  3 mL Intravenous Q12H   sodium polystyrene  15 g Oral Once   traZODone  50 mg Oral QHS   umeclidinium-vilanterol  1 puff Inhalation Daily   Continuous Infusions:  sodium chloride      Assessment & Plan:   Principal Problem:   Chest pain Active Problems:   GERD (gastroesophageal reflux disease)   HTN (hypertension)   Diabetes type 2, controlled (HCC)   CKD (chronic kidney disease), stage IV (HCC)   CAD (coronary artery disease)   Hypothyroidism   Unstable angina ([place])   Unstable angina with CAD S/p cath on 10/13 found with signifi. Ostial and prox LAD dz. S/p DES to prox/ostial LAD.  Continue Imdur, beta blk, asa, statin, and plavix -cardiology following Repeat echo EF 30 to 35%, global hypokinesis Not a good candidate ACE/ARB/Entresto given renal dysfunction 10/15 no further chest pain  EKG today with nonspecific changes of ST/T  Consider adding SGL  T2 inhibitor      2. HTN-  Stable Continue isosorbide and carvedilol    3. DL-  Continue on statins Goal LDL less than 70    4.DMII-  continue R-ISS    5.AKI on CKD IV Avoid nephrotoxic meds 10/15 creatinine increasing Nephrology consulted input was appreciated Possibly due to IV contrast nephropathy Started gentle IV fluids for hydration at 50 ml/hr Avoid unnecessary nephrotoxic meds Per nephrology expect creatinine increased for 1-2 more days before plateau Will need to follow-up with nephrology as outpatient   Hyperkalemia Mild 2/2 AKI Will give kayexalate today Monitor   7.ICM S/pcath as above Echo from 10/22 with EF 30 to 35% Continue carvedilol, isosorbide Consider SG LT 2 inhibitor Continue I's and O's Continue daily weight      DVT prophylaxis: heparin Code Status:full Family Communication: none at bedside Disposition Plan:  Status is: Inpatient  Remains inpatient appropriate because:Inpatient level of care appropriate due to severity of illness   Dispo: The patient is from: Home              Anticipated d/c is to: Home              Patient currently is not medically stable to d/c.              Difficult to place patient No    Renal function worsening         LOS: 3 days   Time spent:45 min with >50% on coc    Nolberto Hanlon, MD [facility] Pager [phone]  If 7PM-7AM, please contact night-coverage 12/27/2020, 9:40 AM

## 2020-12-27 NOTE — Progress Notes (Addendum)
[facility]  ROUNDING NOTE   Subjective:   Mckenzie Taylor is a 80 year old female with past medical history including anemia of chronic disease, COPD, CAD, hyperlipidemia, diabetes type 2, polycystic kidneys, hypertension, and GERD.  Patient reports to the emergency room with complaints of chest pain and shortness of breath.  Patient was admitted for Chest pain [R07.9] Acute on chronic congestive heart failure, unspecified heart failure type [facility]) [I50.9]  Patient is known to our clinic from previous hospitalizations.  She is followed by Dr. Posey Pronto at the [facility] in [place] and states she was about to be referred to nephrology before this hospitalization.  Prior notes indicate patient has multiple ED visits due to chest pain related to unstable angina.  Patient reports 10 out of 10 chest pain, sharp indigestion, and a burning sensation with shortness of breath and nausea.  Patient is chronically on 2 L nasal cannula.  She reports being out of nitroglycerin at home.  Patient was seen and evaluated by cardiology who proceeded to do a heart catheterization on 12/25/2020.  On ED arrival creatinine appears 2.1 to with GFR 23.  Baseline appears to be 1.95 with GFR 26 in September 2022.  We have been consulted to evaluate acute kidney injury   Objective:  Vital signs in last 24 hours:  Temp:  [97.6 F (36.4 C)-98.7 F (37.1 C)] 98.4 F (36.9 C) (10/15 1119) Pulse Rate:  [84-94] 94 (10/15 1119) Resp:  [15-20] 17 (10/15 1119) BP: (99-148)/(59-84) 148/84 (10/15 1119) SpO2:  [96 %-100 %] 99 % (10/15 1119)  Weight change:  Filed Weights   12/24/20 1519 12/26/20 0442  Weight: 88 kg 88.8 kg    Intake/Output: I/O last 3 completed shifts: In: 27 [P.O.:720] Out: -    Intake/Output this shift:  Total I/O In: 240 [P.O.:240] Out: -   Physical Exam: General: NAD, resting in bed  Head: Normocephalic, atraumatic. Moist oral mucosal membranes  Eyes: Anicteric  Lungs:   Clear to auscultation, normal effort,  O2  Heart: Regular rate and rhythm  Abdomen:  Soft, nontender, nondistended  Extremities: No peripheral edema.  Neurologic: Nonfocal, moving all four extremities  Skin: No lesions       Basic Metabolic Panel: Recent Labs  Lab 12/24/20 1519 12/26/20 0419 12/27/20 0639  NA 140 136 139  K 4.1 4.8 5.3*  CL 103 103 104  CO2 27 26 26   GLUCOSE 181* 141* 137*  BUN 27* 33* 38*  CREATININE 2.12* 2.41* 2.83*  CALCIUM 10.8* 9.4 9.2    Liver Function Tests: No results for input(s): AST, ALT, ALKPHOS, BILITOT, PROT, ALBUMIN in the last 168 hours. No results for input(s): LIPASE, AMYLASE in the last 168 hours. No results for input(s): AMMONIA in the last 168 hours.  CBC: Recent Labs  Lab 12/24/20 1519 12/26/20 0419  WBC 8.5 9.3  HGB 10.2* 9.5*  HCT 32.7* 31.3*  MCV 89.3 91.8  PLT 361 330    Cardiac Enzymes: No results for input(s): CKTOTAL, CKMB, CKMBINDEX, TROPONINI in the last 168 hours.  BNP: Invalid input(s): POCBNP  CBG: Recent Labs  Lab 12/26/20 1115 12/26/20 1642 12/26/20 2121 12/27/20 0749 12/27/20 1119  GLUCAP 188* 131* 159* 150* 171*    Microbiology: Results for orders placed or performed during the hospital encounter of 12/24/20  Resp Panel by RT-PCR (Flu A&B, Covid) Nasopharyngeal Swab     Status: None   Collection Time: 12/24/20  8:02 PM   Specimen: Nasopharyngeal Swab; Nasopharyngeal(NP) swabs in  vial transport medium  Result Value Ref Range Status   SARS Coronavirus 2 by RT PCR NEGATIVE NEGATIVE Final    Comment: (NOTE) SARS-CoV-2 target nucleic acids are NOT DETECTED.  The SARS-CoV-2 RNA is generally detectable in upper respiratory specimens during the acute phase of infection. The lowest concentration of SARS-CoV-2 viral copies this assay can detect is 138 copies/mL. A negative result does not preclude SARS-Cov-2 infection and should not be used as the sole basis for treatment or other patient  management decisions. A negative result may occur with  improper specimen collection/handling, submission of specimen other than nasopharyngeal swab, presence of viral mutation(s) within the areas targeted by this assay, and inadequate number of viral copies(<138 copies/mL). A negative result must be combined with clinical observations, patient history, and epidemiological information. The expected result is Negative.  Fact Sheet for Patients:  EntrepreneurPulse.com.au  Fact Sheet for Healthcare Providers:  IncredibleEmployment.be  This test is no t yet approved or cleared by the [place] FDA and  has been authorized for detection and/or diagnosis of SARS-CoV-2 by FDA under an Emergency Use Authorization (EUA). This EUA will remain  in effect (meaning this test can be used) for the duration of the COVID-19 declaration under Section 564(b)(1) of the Act, 21 U.S.C.section 360bbb-3(b)(1), unless the authorization is terminated  or revoked sooner.       Influenza A by PCR NEGATIVE NEGATIVE Final   Influenza B by PCR NEGATIVE NEGATIVE Final    Comment: (NOTE) The Xpert Xpress SARS-CoV-2/FLU/RSV plus assay is intended as an aid in the diagnosis of influenza from Nasopharyngeal swab specimens and should not be used as a sole basis for treatment. Nasal washings and aspirates are unacceptable for Xpert Xpress SARS-CoV-2/FLU/RSV testing.  Fact Sheet for Patients: EntrepreneurPulse.com.au  Fact Sheet for Healthcare Providers: IncredibleEmployment.be  This test is not yet approved or cleared by the [place] FDA and has been authorized for detection and/or diagnosis of SARS-CoV-2 by FDA under an Emergency Use Authorization (EUA). This EUA will remain in effect (meaning this test can be used) for the duration of the COVID-19 declaration under Section 564(b)(1) of the Act, 21 U.S.C. section 360bbb-3(b)(1),  unless the authorization is terminated or revoked.  Performed at [facility], [address] [place], [place] [address]     Coagulation Studies: No results for input(s): LABPROT, INR in the last 72 hours.  Urinalysis: No results for input(s): COLORURINE, LABSPEC, PHURINE, GLUCOSEU, HGBUR, BILIRUBINUR, KETONESUR, PROTEINUR, UROBILINOGEN, NITRITE, LEUKOCYTESUR in the last 72 hours.  Invalid input(s): APPERANCEUR    Imaging: ECHOCARDIOGRAM COMPLETE  Result Date: 12/26/2020    ECHOCARDIOGRAM REPORT   Patient Name:   Mckenzie Taylor Date of Exam: 12/26/2020 Medical Rec #:  [id number]         Height:       65.0 in Accession #:    [id number]        Weight:       195.8 lb Date of Birth:  05-04-1940        BSA:          1.960 m Patient Age:    67 years          BP:           81/53 mmHg Patient Gender: F                 HR:           101 bpm. Exam Location:  [facility] Procedure: 2D Echo,  Color Doppler, Cardiac Doppler and Intracardiac            Opacification Agent Indications:     R07.9 Chest Pain  History:         Patient has prior history of Echocardiogram examinations, most                  recent 09/16/2020. CHF, CAD, CKD and COPD; Risk                  Factors:Hypertension, Dyslipidemia, Diabetes and Former Smoker.  Sonographer:     Charmayne Sheer Referring Phys:  Josephine Diagnosing Phys: Ida Rogue MD  Sonographer Comments: Technically difficult study due to poor echo windows. Image acquisition challenging due to COPD. IMPRESSIONS  1. Challenging images  2. Left ventricular ejection fraction, by estimation, is 30 to 35%. The left ventricle has moderately decreased function. The left ventricle demonstrates global hypokinesis. The left ventricular internal cavity size was mildly dilated. There is mild left ventricular hypertrophy. Left ventricular diastolic parameters are indeterminate.  3. Right ventricular systolic function is normal. The right ventricular size is normal.  Tricuspid regurgitation signal is inadequate for assessing PA pressure.  4. A small pericardial effusion is present. FINDINGS  Left Ventricle: Left ventricular ejection fraction, by estimation, is 30 to 35%. The left ventricle has moderately decreased function. The left ventricle demonstrates global hypokinesis. Definity contrast agent was given IV to delineate the left ventricular endocardial borders. The left ventricular internal cavity size was mildly dilated. There is mild left ventricular hypertrophy. Left ventricular diastolic parameters are indeterminate. Right Ventricle: The right ventricular size is normal. No increase in right ventricular wall thickness. Right ventricular systolic function is normal. Tricuspid regurgitation signal is inadequate for assessing PA pressure. Left Atrium: Left atrial size was normal in size. Right Atrium: Right atrial size was normal in size. Pericardium: A small pericardial effusion is present. Mitral Valve: The mitral valve is normal in structure. Moderate mitral annular calcification. No evidence of mitral valve regurgitation. No evidence of mitral valve stenosis. MV peak gradient, 11.0 mmHg. The mean mitral valve gradient is 4.0 mmHg. Tricuspid Valve: The tricuspid valve is normal in structure. Tricuspid valve regurgitation is not demonstrated. No evidence of tricuspid stenosis. Aortic Valve: The aortic valve was not well visualized. Aortic valve regurgitation is not visualized. No aortic stenosis is present. Aortic valve mean gradient measures 6.0 mmHg. Aortic valve peak gradient measures 9.4 mmHg. Aortic valve area, by VTI measures 2.84 cm. Pulmonic Valve: The pulmonic valve was normal in structure. Pulmonic valve regurgitation is not visualized. No evidence of pulmonic stenosis. Aorta: The aortic root is normal in size and structure. Venous: The pulmonary veins were not well visualized. The inferior vena cava was not well visualized. The inferior vena cava is normal in  size with greater than 50% respiratory variability, suggesting right atrial pressure of 3 mmHg. IAS/Shunts: No atrial level shunt detected by color flow Doppler.  LEFT VENTRICLE PLAX 2D LVIDd:         4.40 cm     Diastology LVIDs:         3.90 cm     LV e' medial:    11.50 cm/s LV PW:         1.50 cm     LV E/e' medial:  13.4 LV IVS:        1.10 cm     LV e' lateral:   11.30 cm/s LVOT diam:  2.20 cm     LV E/e' lateral: 13.6 LV SV:         85 LV SV Index:   43 LVOT Area:     3.80 cm  LV Volumes (MOD) LV vol d, MOD A2C: 90.3 ml LV vol d, MOD A4C: 95.9 ml LV vol s, MOD A2C: 55.2 ml LV vol s, MOD A4C: 60.4 ml LV SV MOD A2C:     35.1 ml LV SV MOD A4C:     95.9 ml LV SV MOD BP:      39.0 ml LEFT ATRIUM             Index LA diam:        4.40 cm 2.24 cm/m LA Vol (A2C):   28.0 ml 14.28 ml/m LA Vol (A4C):   41.3 ml 21.07 ml/m LA Biplane Vol: 33.9 ml 17.29 ml/m  AORTIC VALVE                     PULMONIC VALVE AV Area (Vmax):    3.30 cm      PV Vmax:       0.90 m/s AV Area (Vmean):   2.99 cm      PV Vmean:      65.600 cm/s AV Area (VTI):     2.84 cm      PV VTI:        0.145 m AV Vmax:           153.00 cm/s   PV Peak grad:  3.2 mmHg AV Vmean:          114.000 cm/s  PV Mean grad:  2.0 mmHg AV VTI:            0.298 m AV Peak Grad:      9.4 mmHg AV Mean Grad:      6.0 mmHg LVOT Vmax:         133.00 cm/s LVOT Vmean:        89.700 cm/s LVOT VTI:          0.223 m LVOT/AV VTI ratio: 0.75  AORTA Ao Root diam: 2.70 cm MITRAL VALVE MV Area (PHT): 5.97 cm     SHUNTS MV Area VTI:   2.97 cm     Systemic VTI:  0.22 m MV Peak grad:  11.0 mmHg    Systemic Diam: 2.20 cm MV Mean grad:  4.0 mmHg MV Vmax:       1.66 m/s MV Vmean:      95.2 cm/s MV Decel Time: 127 msec MV E velocity: 153.67 cm/s Ida Rogue MD Electronically signed by Ida Rogue MD Signature Date/Time: 12/26/2020/2:47:43 PM    Final      Medications:    sodium chloride      aspirin EC  81 mg Oral Daily   budesonide  0.25 mg Nebulization BID    carvedilol  6.25 mg Oral BID WC   clopidogrel  75 mg Oral Daily   famotidine  20 mg Oral Daily   heparin  5,000 Units Subcutaneous Q8H   insulin aspart  0-9 Units Subcutaneous TID WC   isosorbide mononitrate  30 mg Oral Daily   melatonin  5 mg Oral QHS   rosuvastatin  40 mg Oral Daily   sodium chloride flush  3 mL Intravenous Q12H   traZODone  50 mg Oral QHS   umeclidinium-vilanterol  1 puff Inhalation Daily   sodium chloride, acetaminophen, nitroGLYCERIN, ondansetron (ZOFRAN) IV, sodium chloride flush  Assessment/ Plan:  Ms. Mckenzie Taylor is a 80 y.o.  female with past medical history including anemia of chronic disease, COPD, CAD, hyperlipidemia, diabetes type 2, polycystic kidneys, hypertension, and GERD.  Patient reports to the emergency room with complaints of chest pain and shortness of breath.  Patient was admitted for Chest pain [R07.9] Acute on chronic congestive heart failure, unspecified heart failure type ([place]) [I50.9]   Acute Kidney Injury with hyperkalemia on chronic kidney disease stage 4 with baseline creatinine 1.95 and GFR of 26 on 11/27/20  Acute kidney injury complicated by polycystic kidneys Acute kidney injury secondary to IV contrast nephropathy IV contrast exposure during cardiac cath on 12/25/2020.  No indication for dialysis at this time.  Will order gentle IV fluids at normal saline 50 mL/h.  We will monitor fluid volume closely.  Potassium of 5.3 treated with Kayexalate by primary team.  We will expect creatinine increase for 1-2 more days before plateau.  Will ensure patient gets follow-up appointment with our office at discharge.  Lab Results  Component Value Date   CREATININE 2.83 (H) 12/27/2020   CREATININE 2.41 (H) 12/26/2020   CREATININE 2.12 (H) 12/24/2020    Intake/Output Summary (Last 24 hours) at 12/27/2020 1330 Last data filed at 12/27/2020 1031 Gross per 24 hour  Intake 480 ml  Output --  Net 480 ml   2. Chronic systolic heart  failure: Recent ECHO shows Ef 30-35% Will monitor IV fluids to prevent fluid volume overload  3.  Anemia of chronic disease Hemoglobin 9.5, within goal  4.  CAD with unstable angina Cardiac cath performed on 12/25/2020 indicates severe LAD and 1 stent placed.  GI cocktail ordered for heartburn.  Plan for aspirin and Plavix therapy for 12 months.   LOS: 3 Daana Petrasek 10/15/20221:30 PM

## 2020-12-27 NOTE — Progress Notes (Addendum)
Progress Note  Patient Name: Mckenzie Taylor Date of Encounter: 12/27/2020  [facility] Cardiologist: Ida Rogue, MD   Subjective   Patient denies recurrent chest pain. She is on baseline 3LO2. Kidney function worse this AM.  Inpatient Medications    Scheduled Meds:  aspirin EC  81 mg Oral Daily   budesonide  0.25 mg Nebulization BID   carvedilol  6.25 mg Oral BID WC   clopidogrel  75 mg Oral Daily   heparin  5,000 Units Subcutaneous Q8H   insulin aspart  0-9 Units Subcutaneous TID WC   isosorbide mononitrate  30 mg Oral Daily   melatonin  5 mg Oral QHS   rosuvastatin  40 mg Oral Daily   sodium chloride flush  3 mL Intravenous Q12H   traZODone  50 mg Oral QHS   umeclidinium-vilanterol  1 puff Inhalation Daily   Continuous Infusions:  sodium chloride     PRN Meds: sodium chloride, acetaminophen, nitroGLYCERIN, ondansetron (ZOFRAN) IV, sodium chloride flush   Vital Signs    Vitals:   12/26/20 2040 12/26/20 2103 12/27/20 0031 12/27/20 0501  BP:  (!) 134/59 (!) 145/84 120/66  Pulse:  93 94 89  Resp:  18 20 15   Temp:  97.8 F (36.6 C) 97.6 F (36.4 C) 98.7 F (37.1 C)  TempSrc:  Oral Oral   SpO2: 96% 99% 97% 100%  Weight:      Height:        Intake/Output Summary (Last 24 hours) at 12/27/2020 0724 Last data filed at 12/26/2020 1807 Gross per 24 hour  Intake 720 ml  Output --  Net 720 ml   Last 3 Weights 12/26/2020 12/24/2020 11/30/2020  Weight (lbs) 195 lb 12.8 oz 194 lb 197 lb 1.6 oz  Weight (kg) 88.814 kg 87.998 kg 89.404 kg      Telemetry    NSR, HR 80-90s, PVC, prolonged Qtc - Personally Reviewed  ECG    No new - Personally Reviewed  Physical Exam   GEN: No acute distress.   Neck: No JVD Cardiac: RRR, no murmurs, rubs, or gallops.  Respiratory: diminished sounds, wheezing GI: Soft, nontender, non-distended  MS: No edema; No deformity. Neuro:  Nonfocal  Psych: Normal affect   Labs    High Sensitivity Troponin:   Recent Labs   Lab 11/27/20 0831 11/27/20 1013 12/24/20 1519 12/24/20 1730  TROPONINIHS 36* 42* 48* 57*     Chemistry Recent Labs  Lab 12/24/20 1519 12/26/20 0419  NA 140 136  K 4.1 4.8  CL 103 103  CO2 27 26  GLUCOSE 181* 141*  BUN 27* 33*  CREATININE 2.12* 2.41*  CALCIUM 10.8* 9.4  GFRNONAA 23* 20*  ANIONGAP 10 7    Lipids No results for input(s): CHOL, TRIG, HDL, LABVLDL, LDLCALC, CHOLHDL in the last 168 hours.  Hematology Recent Labs  Lab 12/24/20 1519 12/26/20 0419  WBC 8.5 9.3  RBC 3.66* 3.41*  HGB 10.2* 9.5*  HCT 32.7* 31.3*  MCV 89.3 91.8  MCH 27.9 27.9  MCHC 31.2 30.4  RDW 16.8* 16.7*  PLT 361 330   Thyroid  Recent Labs  Lab 12/24/20 2325  TSH 1.677  FREET4 0.80    BNP Recent Labs  Lab 12/24/20 1519  BNP 944.1*    DDimer No results for input(s): DDIMER in the last 168 hours.   Radiology    CARDIAC CATHETERIZATION  Result Date: 12/25/2020   Ost LM to Mid LM lesion is 40% stenosed.   Dist  LAD lesion is 80% stenosed.   Ost Cx to Prox Cx lesion is 30% stenosed.   Ramus lesion is 40% stenosed.   1st Diag lesion is 100% stenosed.   Ost LAD to Prox LAD lesion is 80% stenosed.   Prox LAD lesion is 60% stenosed.   A drug-eluting stent was successfully placed using a STENT ONYX FRONTIER 3.0X26.   Post intervention, there is a 0% residual stenosis.   Post intervention, there is a 0% residual stenosis. 1.  Significant ostial and proximal LAD disease.  This was significant by IVUS with a minimal luminal area of 3.5 cm.  Mild ramus and left circumflex disease.  The left circumflex is dominant. 2.  Moderately elevated left ventricular end-diastolic pressure at 22 mmHg. 3.  Successful angioplasty and drug-eluting stent placement to the proximal/ostial LAD with 1 stent. Recommendations: Dual antiplatelet therapy for at least 12 months. Aggressive treatment of risk factors.  Gentle hydration for 6 to 8 hours to prevent contrast-induced nephropathy given significant underlying  chronic kidney disease.  The procedure was overall difficult and required 120 mL of contrast.   ECHOCARDIOGRAM COMPLETE  Result Date: 12/26/2020    ECHOCARDIOGRAM REPORT   Patient Name:   Mckenzie Taylor Date of Exam: 12/26/2020 Medical Rec #:  [id number]         Height:       65.0 in Accession #:    [id number]        Weight:       195.8 lb Date of Birth:  02-10-1941        BSA:          1.960 m Patient Age:    80 years          BP:           81/53 mmHg Patient Gender: F                 HR:           101 bpm. Exam Location:  [facility] Procedure: 2D Echo, Color Doppler, Cardiac Doppler and Intracardiac            Opacification Agent Indications:     R07.9 Chest Pain  History:         Patient has prior history of Echocardiogram examinations, most                  recent 09/16/2020. CHF, CAD, CKD and COPD; Risk                  Factors:Hypertension, Dyslipidemia, Diabetes and Former Smoker.  Sonographer:     Charmayne Sheer Referring Phys:  Andrews Diagnosing Phys: Ida Rogue MD  Sonographer Comments: Technically difficult study due to poor echo windows. Image acquisition challenging due to COPD. IMPRESSIONS  1. Challenging images  2. Left ventricular ejection fraction, by estimation, is 30 to 35%. The left ventricle has moderately decreased function. The left ventricle demonstrates global hypokinesis. The left ventricular internal cavity size was mildly dilated. There is mild left ventricular hypertrophy. Left ventricular diastolic parameters are indeterminate.  3. Right ventricular systolic function is normal. The right ventricular size is normal. Tricuspid regurgitation signal is inadequate for assessing PA pressure.  4. A small pericardial effusion is present. FINDINGS  Left Ventricle: Left ventricular ejection fraction, by estimation, is 30 to 35%. The left ventricle has moderately decreased function. The left ventricle demonstrates global hypokinesis. Definity contrast agent was given IV to delineate  the left ventricular endocardial borders. The left ventricular internal cavity size was mildly dilated. There is mild left ventricular hypertrophy. Left ventricular diastolic parameters are indeterminate. Right Ventricle: The right ventricular size is normal. No increase in right ventricular wall thickness. Right ventricular systolic function is normal. Tricuspid regurgitation signal is inadequate for assessing PA pressure. Left Atrium: Left atrial size was normal in size. Right Atrium: Right atrial size was normal in size. Pericardium: A small pericardial effusion is present. Mitral Valve: The mitral valve is normal in structure. Moderate mitral annular calcification. No evidence of mitral valve regurgitation. No evidence of mitral valve stenosis. MV peak gradient, 11.0 mmHg. The mean mitral valve gradient is 4.0 mmHg. Tricuspid Valve: The tricuspid valve is normal in structure. Tricuspid valve regurgitation is not demonstrated. No evidence of tricuspid stenosis. Aortic Valve: The aortic valve was not well visualized. Aortic valve regurgitation is not visualized. No aortic stenosis is present. Aortic valve mean gradient measures 6.0 mmHg. Aortic valve peak gradient measures 9.4 mmHg. Aortic valve area, by VTI measures 2.84 cm. Pulmonic Valve: The pulmonic valve was normal in structure. Pulmonic valve regurgitation is not visualized. No evidence of pulmonic stenosis. Aorta: The aortic root is normal in size and structure. Venous: The pulmonary veins were not well visualized. The inferior vena cava was not well visualized. The inferior vena cava is normal in size with greater than 50% respiratory variability, suggesting right atrial pressure of 3 mmHg. IAS/Shunts: No atrial level shunt detected by color flow Doppler.  LEFT VENTRICLE PLAX 2D LVIDd:         4.40 cm     Diastology LVIDs:         3.90 cm     LV e' medial:    11.50 cm/s LV PW:         1.50 cm     LV E/e' medial:  13.4 LV IVS:        1.10 cm     LV e'  lateral:   11.30 cm/s LVOT diam:     2.20 cm     LV E/e' lateral: 13.6 LV SV:         85 LV SV Index:   43 LVOT Area:     3.80 cm  LV Volumes (MOD) LV vol d, MOD A2C: 90.3 ml LV vol d, MOD A4C: 95.9 ml LV vol s, MOD A2C: 55.2 ml LV vol s, MOD A4C: 60.4 ml LV SV MOD A2C:     35.1 ml LV SV MOD A4C:     95.9 ml LV SV MOD BP:      39.0 ml LEFT ATRIUM             Index LA diam:        4.40 cm 2.24 cm/m LA Vol (A2C):   28.0 ml 14.28 ml/m LA Vol (A4C):   41.3 ml 21.07 ml/m LA Biplane Vol: 33.9 ml 17.29 ml/m  AORTIC VALVE                     PULMONIC VALVE AV Area (Vmax):    3.30 cm      PV Vmax:       0.90 m/s AV Area (Vmean):   2.99 cm      PV Vmean:      65.600 cm/s AV Area (VTI):     2.84 cm      PV VTI:        0.145 m AV Vmax:  153.00 cm/s   PV Peak grad:  3.2 mmHg AV Vmean:          114.000 cm/s  PV Mean grad:  2.0 mmHg AV VTI:            0.298 m AV Peak Grad:      9.4 mmHg AV Mean Grad:      6.0 mmHg LVOT Vmax:         133.00 cm/s LVOT Vmean:        89.700 cm/s LVOT VTI:          0.223 m LVOT/AV VTI ratio: 0.75  AORTA Ao Root diam: 2.70 cm MITRAL VALVE MV Area (PHT): 5.97 cm     SHUNTS MV Area VTI:   2.97 cm     Systemic VTI:  0.22 m MV Peak grad:  11.0 mmHg    Systemic Diam: 2.20 cm MV Mean grad:  4.0 mmHg MV Vmax:       1.66 m/s MV Vmean:      95.2 cm/s MV Decel Time: 127 msec MV E velocity: 153.67 cm/s Ida Rogue MD Electronically signed by Ida Rogue MD Signature Date/Time: 12/26/2020/2:47:43 PM    Final     Cardiac Studies   Cardiac cath 12/25/20     Ost LM to Mid LM lesion is 40% stenosed.   Dist LAD lesion is 80% stenosed.   Ost Cx to Prox Cx lesion is 30% stenosed.   Ramus lesion is 40% stenosed.   1st Diag lesion is 100% stenosed.   Ost LAD to Prox LAD lesion is 80% stenosed.   Prox LAD lesion is 60% stenosed.   A drug-eluting stent was successfully placed using a STENT ONYX FRONTIER 3.0X26.   Post intervention, there is a 0% residual stenosis.   Post intervention,  there is a 0% residual stenosis.   1.  Significant ostial and proximal LAD disease.  This was significant by IVUS with a minimal luminal area of 3.5 cm.  Mild ramus and left circumflex disease.  The left circumflex is dominant. 2.  Moderately elevated left ventricular end-diastolic pressure at 22 mmHg. 3.  Successful angioplasty and drug-eluting stent placement to the proximal/ostial LAD with 1 stent.   Recommendations: Dual antiplatelet therapy for at least 12 months. Aggressive treatment of risk factors.  Gentle hydration for 6 to 8 hours to prevent contrast-induced nephropathy given significant underlying chronic kidney disease.  The procedure was overall difficult and required 120 mL of contrast.   Coronary Diagrams   Diagnostic Dominance: Left Intervention     Echo  pending     Echo 09/2020  1. Left ventricular ejection fraction, by estimation, is 25 to 30%. The  left ventricle has severely decreased function. The left ventricle  demonstrates regional wall motion abnormalities (see scoring  diagram/findings for description). The left  ventricular internal cavity size was mildly dilated. There is mild left  ventricular hypertrophy. Left ventricular diastolic parameters are  indeterminate. There is severe hypokinesis of the left ventricular, entire  anteroseptal wall, anterior wall and  anterolateral wall. There is severe hypokinesis of the left ventricular,  apical inferior segment and apical segment.   2. Right ventricular systolic function is normal. The right ventricular  size is normal. Moderately increased right ventricular wall thickness.  Tricuspid regurgitation signal is inadequate for assessing PA pressure.   3. A small pericardial effusion is present. The pericardial effusion is  anterior to the right ventricle.   4. The mitral valve is abnormal.  No evidence of mitral valve  regurgitation. Moderate mitral annular calcification.   5. The aortic valve was not well  visualized. Aortic valve regurgitation  is not visualized. No aortic stenosis is present.   Echo 12/26/20   1. Challenging images   2. Left ventricular ejection fraction, by estimation, is 30 to 35%. The  left ventricle has moderately decreased function. The left ventricle  demonstrates global hypokinesis. The left ventricular internal cavity size  was mildly dilated. There is mild  left ventricular hypertrophy. Left ventricular diastolic parameters are  indeterminate.   3. Right ventricular systolic function is normal. The right ventricular  size is normal. Tricuspid regurgitation signal is inadequate for assessing  PA pressure.   4. A small pericardial effusion is present.  Patient Profile     80 y.o. female with hx of DM2, COPD, ICM, 3V CAD August 2022 medical management recommended at that time, presenting with chest pain.  Assessment & Plan    CAD with unstable angina - Frequent readmission for UA symptoms prior cardiac cath August 2022. Review of images by interventionalist felt intervention was needed.  - cath this admission showed significant ostial and proximal LAD disease with mild ramus an dLCx disease, treated with successful angioplasty and DES to the p/ostial LAD with 1 stent. LVEDP was 9mmHg.  - Plan for DAPT with ASA and Plavix for at least 12 months - continue coreg 6.25mg BID - continue Imdur - No ACE/ARB for CKD - Cath site stable. She denies further chest pain   ICM - EF 25% by echo 09/2020.  - Echo this admission showed LVEF 30-35%, global HK, mild LVH, small pericardial effusion - cath as above - continue Coreg, Imdur - consider adding hydralazine, however intermittent soft bps preventing this   CKD - Scr 2.12>2.41>2.83 - continue to monitor post-cath   HLD - LDL 142, 09/2020 - Continue Crestor   HTN - BPS intermittently soft - continue coreg and Imdur  For questions or updates, please contact Vivian Please consult [email or web address] for  contact info under        Signed, Cadence Ninfa Meeker, PA-C  12/27/2020, 7:24 AM     I have seen and examined this patient with Cadence Furth.  Agree with above, note added to reflect my findings.  On exam, RRR, no murmurs, lungs clear, no lower extremity edema.  Patient's main complaint today is heartburn.  She does feel better after catheterization and stents placed to her LAD.  We Avalina Benko start her on GI cocktail to see if this improves her heartburn.  It can be further adjusted by her primary team.  She does have a contrast-induced nephropathy likely from her catheterization.  Primary team is planning to start IV fluids.  We Kimber Esterly need to recheck creatinine tomorrow.  Would not give her more than 500 to 750 cc of fluid throughout the day today.     Jacquilyn Seldon M. Vitoria Conyer MD 12/27/2020 11:47 AM

## 2020-12-28 DIAGNOSIS — E1122 Type 2 diabetes mellitus with diabetic chronic kidney disease: Secondary | ICD-10-CM | POA: Diagnosis not present

## 2020-12-28 DIAGNOSIS — N184 Chronic kidney disease, stage 4 (severe): Secondary | ICD-10-CM | POA: Diagnosis not present

## 2020-12-28 DIAGNOSIS — I25110 Atherosclerotic heart disease of native coronary artery with unstable angina pectoris: Secondary | ICD-10-CM | POA: Diagnosis not present

## 2020-12-28 DIAGNOSIS — I200 Unstable angina: Secondary | ICD-10-CM | POA: Diagnosis not present

## 2020-12-28 DIAGNOSIS — R079 Chest pain, unspecified: Secondary | ICD-10-CM | POA: Diagnosis not present

## 2020-12-28 DIAGNOSIS — J449 Chronic obstructive pulmonary disease, unspecified: Secondary | ICD-10-CM

## 2020-12-28 LAB — GLUCOSE, CAPILLARY
Glucose-Capillary: 133 mg/dL — ABNORMAL HIGH (ref 70–99)
Glucose-Capillary: 172 mg/dL — ABNORMAL HIGH (ref 70–99)
Glucose-Capillary: 148 mg/dL — ABNORMAL HIGH (ref 70–99)
Glucose-Capillary: 169 mg/dL — ABNORMAL HIGH (ref 70–99)
Glucose-Capillary: 163 mg/dL — ABNORMAL HIGH (ref 70–99)

## 2020-12-28 LAB — BASIC METABOLIC PANEL
Anion gap: 8 (ref 5–15)
BUN: 38 mg/dL — ABNORMAL HIGH (ref 8–23)
CO2: 28 mmol/L (ref 22–32)
Calcium: 8.6 mg/dL — ABNORMAL LOW (ref 8.9–10.3)
Chloride: 103 mmol/L (ref 98–111)
Creatinine, Ser: 2.65 mg/dL — ABNORMAL HIGH (ref 0.44–1.00)
GFR, Estimated: 18 mL/min — ABNORMAL LOW (ref >60)
Glucose, Bld: 143 mg/dL — ABNORMAL HIGH (ref 70–99)
Potassium: 5.1 mmol/L (ref 3.5–5.1)
Sodium: 139 mmol/L (ref 135–145)

## 2020-12-28 MED ORDER — PREDNISONE 20 MG PO TABS
20.0000 mg | ORAL_TABLET | Freq: Every day | ORAL | Status: DC
  Administered 2020-12-29: 20 mg via ORAL
  Filled 2020-12-28: qty 1

## 2020-12-28 MED ORDER — POLYMYXIN B-TRIMETHOPRIM 10000-0.1 UNIT/ML-% OP SOLN: 1.0000 [drp] | Freq: Four times a day (QID) | OPHTHALMIC | Status: DC

## 2020-12-28 MED ORDER — ALBUTEROL SULFATE HFA 108 (90 BASE) MCG/ACT IN AERS
1.0000 | INHALATION_SPRAY | RESPIRATORY_TRACT | Status: DC | PRN
  Administered 2020-12-28 – 2020-12-29 (×4): 1 via RESPIRATORY_TRACT
  Filled 2020-12-28: qty 6.7

## 2020-12-28 NOTE — Plan of Care (Signed)
  Problem: Health Behavior/Discharge Planning: Goal: Ability to manage health-related needs will improve Outcome: Progressing   Problem: Clinical Measurements: Goal: Ability to maintain clinical measurements within normal limits will improve Outcome: Progressing Goal: Will remain free from infection Outcome: Progressing Goal: Diagnostic test results will improve Outcome: Progressing Goal: Respiratory complications will improve Outcome: Progressing Goal: Cardiovascular complication will be avoided Outcome: Progressing   Problem: Activity: Goal: Risk for activity intolerance will decrease Outcome: Progressing   Problem: Elimination: Goal: Will not experience complications related to bowel motility Outcome: Progressing Goal: Will not experience complications related to urinary retention Outcome: Progressing   Problem: Pain Managment: Goal: General experience of comfort will improve Outcome: Progressing   Problem: Safety: Goal: Ability to remain free from injury will improve Outcome: Progressing   Problem: Skin Integrity: Goal: Risk for impaired skin integrity will decrease Outcome: Progressing   Problem: Education: Goal: Knowledge of General Education information will improve Description: Including pain rating scale, medication(s)/side effects and non-pharmacologic comfort measures Outcome: Progressing   Problem: Health Behavior/Discharge Planning: Goal: Ability to manage health-related needs will improve Outcome: Progressing   Problem: Clinical Measurements: Goal: Ability to maintain clinical measurements within normal limits will improve Outcome: Progressing Goal: Will remain free from infection Outcome: Progressing Goal: Diagnostic test results will improve Outcome: Progressing Goal: Respiratory complications will improve Outcome: Progressing Goal: Cardiovascular complication will be avoided Outcome: Progressing   Problem: Activity: Goal: Risk for activity  intolerance will decrease Outcome: Progressing   Problem: Nutrition: Goal: Adequate nutrition will be maintained Outcome: Progressing   Problem: Coping: Goal: Level of anxiety will decrease Outcome: Progressing   Problem: Elimination: Goal: Will not experience complications related to bowel motility Outcome: Progressing Goal: Will not experience complications related to urinary retention Outcome: Progressing   Problem: Pain Managment: Goal: General experience of comfort will improve Outcome: Progressing   Problem: Safety: Goal: Ability to remain free from injury will improve Outcome: Progressing   Problem: Skin Integrity: Goal: Risk for impaired skin integrity will decrease Outcome: Progressing

## 2020-12-28 NOTE — Progress Notes (Signed)
Progress Note  Patient Name: Mckenzie Taylor Date of Encounter: 12/28/2020  [facility] Cardiologist: Ida Rogue, MD   Subjective   Complains of reflux, but no cardiac chest pain.  She does state that she knows the difference.  Received Maalox yesterday with improvement in her pain.  Kidney function has improved with fluids, though she is more short of breath today.  We Shad Ledvina hold off on diuresis as her kidney function has improved.  She Jamisyn Langer likely need diuresis prior to discharge.  Inpatient Medications    Scheduled Meds:  aspirin EC  81 mg Oral Daily   budesonide  0.25 mg Nebulization BID   carvedilol  6.25 mg Oral BID WC   clopidogrel  75 mg Oral Daily   famotidine  20 mg Oral Daily   heparin  5,000 Units Subcutaneous Q8H   insulin aspart  0-9 Units Subcutaneous TID WC   isosorbide mononitrate  30 mg Oral Daily   melatonin  5 mg Oral QHS   [START ON 12/29/2020] predniSONE  20 mg Oral Q breakfast   rosuvastatin  40 mg Oral Daily   sodium chloride flush  3 mL Intravenous Q12H   traZODone  50 mg Oral QHS   umeclidinium-vilanterol  1 puff Inhalation Daily   Continuous Infusions:  sodium chloride     PRN Meds: sodium chloride, acetaminophen, albuterol, calcium carbonate, nitroGLYCERIN, ondansetron (ZOFRAN) IV, sodium chloride flush   Vital Signs    Vitals:   12/28/20 0349 12/28/20 0545 12/28/20 0748 12/28/20 1132  BP: 101/65  (!) 159/81 129/71  Pulse: 94  94 93  Resp: 20  17 20   Temp: 98.3 F (36.8 C)  (!) 97.5 F (36.4 C) 97.7 F (36.5 C)  TempSrc:   Oral Oral  SpO2: 100%  99% 99%  Weight:  81 kg    Height:        Intake/Output Summary (Last 24 hours) at 12/28/2020 1255 Last data filed at 12/28/2020 1029 Gross per 24 hour  Intake 625.36 ml  Output 300 ml  Net 325.36 ml    Last 3 Weights 12/28/2020 12/26/2020 12/24/2020  Weight (lbs) 178 lb 9.2 oz 195 lb 12.8 oz 194 lb  Weight (kg) 81 kg 88.814 kg 87.998 kg      Telemetry    Sinus rhythm  with sinus tachycardia  ECG    None new  Physical Exam   GEN: Well nourished, well developed, in no acute distress  HEENT: normal  Neck: no JVD, carotid bruits, or masses Cardiac: RRR; no murmurs, rubs, or gallops,no edema  Respiratory: Diminished lung sounds GI: soft, nontender, nondistended, + BS MS: no deformity or atrophy  Skin: warm and dry Neuro:  Strength and sensation are intact Psych: euthymic mood, full affect   Labs    High Sensitivity Troponin:   Recent Labs  Lab 12/24/20 1519 12/24/20 1730  TROPONINIHS 48* 57*      Chemistry Recent Labs  Lab 12/26/20 0419 12/27/20 0639 12/28/20 0633  NA 136 139 139  K 4.8 5.3* 5.1  CL 103 104 103  CO2 26 26 28   GLUCOSE 141* 137* 143*  BUN 33* 38* 38*  CREATININE 2.41* 2.83* 2.65*  CALCIUM 9.4 9.2 8.6*  GFRNONAA 20* 17* 18*  ANIONGAP 7 9 8      Lipids No results for input(s): CHOL, TRIG, HDL, LABVLDL, LDLCALC, CHOLHDL in the last 168 hours.  Hematology Recent Labs  Lab 12/24/20 1519 12/26/20 0419  WBC 8.5 9.3  RBC 3.66*  3.41*  HGB 10.2* 9.5*  HCT 32.7* 31.3*  MCV 89.3 91.8  MCH 27.9 27.9  MCHC 31.2 30.4  RDW 16.8* 16.7*  PLT 361 330    Thyroid  Recent Labs  Lab 12/24/20 2325  TSH 1.677  FREET4 0.80     BNP Recent Labs  Lab 12/24/20 1519  BNP 944.1*     DDimer No results for input(s): DDIMER in the last 168 hours.   Radiology    No results found.  Cardiac Studies   Cardiac cath 12/25/20     Ost LM to Mid LM lesion is 40% stenosed.   Dist LAD lesion is 80% stenosed.   Ost Cx to Prox Cx lesion is 30% stenosed.   Ramus lesion is 40% stenosed.   1st Diag lesion is 100% stenosed.   Ost LAD to Prox LAD lesion is 80% stenosed.   Prox LAD lesion is 60% stenosed.   A drug-eluting stent was successfully placed using a STENT ONYX FRONTIER 3.0X26.   Post intervention, there is a 0% residual stenosis.   Post intervention, there is a 0% residual stenosis.   1.  Significant ostial and  proximal LAD disease.  This was significant by IVUS with a minimal luminal area of 3.5 cm.  Mild ramus and left circumflex disease.  The left circumflex is dominant. 2.  Moderately elevated left ventricular end-diastolic pressure at 22 mmHg. 3.  Successful angioplasty and drug-eluting stent placement to the proximal/ostial LAD with 1 stent.   Recommendations: Dual antiplatelet therapy for at least 12 months. Aggressive treatment of risk factors.  Gentle hydration for 6 to 8 hours to prevent contrast-induced nephropathy given significant underlying chronic kidney disease.  The procedure was overall difficult and required 120 mL of contrast.   Coronary Diagrams   Diagnostic Dominance: Left Intervention     Echo  pending     Echo 09/2020  1. Left ventricular ejection fraction, by estimation, is 25 to 30%. The  left ventricle has severely decreased function. The left ventricle  demonstrates regional wall motion abnormalities (see scoring  diagram/findings for description). The left  ventricular internal cavity size was mildly dilated. There is mild left  ventricular hypertrophy. Left ventricular diastolic parameters are  indeterminate. There is severe hypokinesis of the left ventricular, entire  anteroseptal wall, anterior wall and  anterolateral wall. There is severe hypokinesis of the left ventricular,  apical inferior segment and apical segment.   2. Right ventricular systolic function is normal. The right ventricular  size is normal. Moderately increased right ventricular wall thickness.  Tricuspid regurgitation signal is inadequate for assessing PA pressure.   3. A small pericardial effusion is present. The pericardial effusion is  anterior to the right ventricle.   4. The mitral valve is abnormal. No evidence of mitral valve  regurgitation. Moderate mitral annular calcification.   5. The aortic valve was not well visualized. Aortic valve regurgitation  is not visualized. No  aortic stenosis is present.   Echo 12/26/20   1. Challenging images   2. Left ventricular ejection fraction, by estimation, is 30 to 35%. The  left ventricle has moderately decreased function. The left ventricle  demonstrates global hypokinesis. The left ventricular internal cavity size  was mildly dilated. There is mild  left ventricular hypertrophy. Left ventricular diastolic parameters are  indeterminate.   3. Right ventricular systolic function is normal. The right ventricular  size is normal. Tricuspid regurgitation signal is inadequate for assessing  PA pressure.   4.  A small pericardial effusion is present.  Patient Profile     80 y.o. female with hx of DM2, COPD, ICM, 3V CAD August 2022 medical management recommended at that time, presenting with chest pain.  Assessment & Plan    1.  Coronary artery disease with unstable angina: Status post DES to the LAD.  Criss Bartles need dual antiplatelet therapy for at least 12 months.  Continue Coreg, Imdur.  LVEDP was significantly elevated, though she had contrast-induced nephropathy.  This is improved with IV fluids.  She Solina Heron likely need diuresis prior to discharge.  2.  Ischemic cardiomyopathy: Ejection fraction 30 to 35%.  Catheterization as above.  We Kymari Nuon hold off on further therapy for now as she has a contrast-induced nephropathy.  3.  Acute on chronic renal failure: Creatinine significantly elevated post catheterization.  Is improved with IV fluids.  Plan per nephrology.  4.  Hyperlipidemia: Continue Crestor   5.  Hypertension: Continue to hold as blood pressure has been low.  For questions or updates, please contact [place] Please consult [email or web address] for contact info under        Signed, Mirta Mally Meredith Leeds, MD  12/28/2020, 12:55 PM

## 2020-12-28 NOTE — Consult Note (Signed)
Pulmonary Medicine          Date: 12/28/2020,   MRN# [id number] Mckenzie Taylor 1940-07-02     AdmissionWeight: 2 kg                 CurrentWeight: 41 kg   Referring physician: Dr. 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  She was acutely hypoxemic and requiring increased oxygen via nasal cannula which relieved her respiratory distress while in the ER.  She was evaluated by cardiology team and found worsening ischemic cardiomyopathy with most recent ejection fraction on transthoracic echo at 25% and there is additional work-up with cardiac cath in process.  She had CT chest done which was November 27, 2020-I reviewed this independently with findings of mild bronchitic changes bilaterally and atelectatic segments of the right middle lobe as well as lingular atelectasis other than that few clinically insignificant nodules.  PCCM consultation for further evaluation management of recurrent COPD exacerbation.   PAST MEDICAL HISTORY   Past Medical History:  Diagnosis Date  . Anemia of chronic disease    Baseline hgb 8.0-8.9  . Autosomal recessive polycystic kidneys   . Avascular necrosis of hip (HCC)    bilateral  . CAD (coronary artery disease) 2004   2004 [facility] with mild and nonobstructive CAD: 20% mid LAD stenosis  . Chronic combined  systolic and diastolic heart failure (HCC)    HFrEF, EF 45% last echo, LVH, diastolic dysfunction 7902  . Chronic kidney disease    PCKD, CKD, adrenal adenoma, cyst  . COPD (chronic obstructive pulmonary disease) ([place])    3L home oxygen  . Diabetes type 2, controlled ([place])   . Former heavy tobacco smoker    1 pk / day. Estimates quit ~ 2015  . GERD (gastroesophageal reflux disease)   . HTN (hypertension)   . Hyperlipidemia with target LDL less than 70   . Hypothyroidism    subclinical. low TSH, normal thyroid panel. biopsy 2010  . Vitamin B12 deficiency      SURGICAL HISTORY   Past Surgical History:  Procedure Laterality Date  . CHOLECYSTECTOMY    . CORONARY STENT INTERVENTION N/A 12/25/2020   Procedure: CORONARY STENT INTERVENTION;  Surgeon: Wellington Hampshire, MD;  Location: [facility];  Service: Cardiovascular;  Laterality: N/A;  . hip replacement     bilateral-secondary to avascular necrosis  . LEFT HEART CATH AND CORONARY ANGIOGRAPHY N/A 10/17/2020   Procedure: LEFT HEART CATH AND CORONARY ANGIOGRAPHY;  Surgeon: Corey Skains, MD;  Location: [facility];  Service: Cardiovascular;  Laterality: N/A;  . LEFT HEART CATH AND CORONARY ANGIOGRAPHY N/A 12/25/2020   Procedure: LEFT HEART CATH AND CORONARY ANGIOGRAPHY;  Surgeon: Wellington Hampshire, MD;  Location: [facility];  Service: Cardiovascular;  Laterality: N/A;  . right eye lens replacement    .  ULNAR NERVE REPAIR     bilateral  . VESICOVAGINAL FISTULA CLOSURE W/ TAH       FAMILY HISTORY   Family History  Problem Relation Age of Onset  . Cancer Father        lung  . COPD Mother   . Heart disease Mother   . Heart disease Maternal Uncle      SOCIAL HISTORY   Social History   Tobacco Use  . Smoking status: Former    Packs/day: 1.00    Years: 50.00    Pack years: 50.00    Types: Cigarettes  . Smokeless tobacco: Never  . Tobacco comments:    1 ppd - 50 years   Substance Use  Topics  . Alcohol use: No  . Drug use: No     MEDICATIONS    Home Medication:    Current Medication:  Current Facility-Administered Medications:  .  0.9 %  sodium chloride infusion, 250 mL, Intravenous, PRN, Fletcher Anon, Muhammad A, MD .  acetaminophen (TYLENOL) tablet 650 mg, 650 mg, Oral, Q4H PRN, Arida, Muhammad A, MD .  albuterol (VENTOLIN HFA) 108 (90 Base) MCG/ACT inhaler 1 puff, 1 puff, Inhalation, Q4H PRN, Athena Masse, MD, 1 puff at 12/28/20 [phone] .  aspirin EC tablet 81 mg, 81 mg, Oral, Daily, Kathlyn Sacramento A, MD, 81 mg at 12/28/20 0849 .  budesonide (PULMICORT) nebulizer solution 0.25 mg, 0.25 mg, Nebulization, BID, Arida, Muhammad A, MD, 0.25 mg at 12/28/20 0815 .  calcium carbonate (TUMS - dosed in mg elemental calcium) chewable tablet 200 mg of elemental calcium, 1 tablet, Oral, QID PRN, Nolberto Hanlon, MD, 200 mg of elemental calcium at 12/27/20 1401 .  carvedilol (COREG) tablet 6.25 mg, 6.25 mg, Oral, BID WC, Arida, Muhammad A, MD, 6.25 mg at 12/28/20 0849 .  clopidogrel (PLAVIX) tablet 75 mg, 75 mg, Oral, Daily, Kathlyn Sacramento A, MD, 75 mg at 12/28/20 0849 .  famotidine (PEPCID) tablet 20 mg, 20 mg, Oral, Daily, Amery, Sahar, MD, 20 mg at 12/28/20 0849 .  heparin injection 5,000 Units, 5,000 Units, Subcutaneous, Q8H, Wellington Hampshire, MD, 5,000 Units at 12/28/20 0610 .  insulin aspart (novoLOG) injection 0-9 Units, 0-9 Units, Subcutaneous, TID WC, Wellington Hampshire, MD, 1 Units at 12/28/20 0848 .  isosorbide mononitrate (IMDUR) 24 hr tablet 30 mg, 30 mg, Oral, Daily, Kathlyn Sacramento A, MD, 30 mg at 12/28/20 0849 .  melatonin tablet 5 mg, 5 mg, Oral, QHS, Sharion Settler, NP, 5 mg at 12/27/20 2207 .  nitroGLYCERIN (NITROSTAT) SL tablet 0.4 mg, 0.4 mg, Sublingual, Q5 min PRN, Kathlyn Sacramento A, MD, 0.4 mg at 12/28/20 0327 .  ondansetron (ZOFRAN) injection 4 mg, 4 mg, Intravenous, Q6H PRN, Wellington Hampshire, MD, 4 mg at 12/26/20 0238 .  rosuvastatin (CRESTOR) tablet 40 mg,  40 mg, Oral, Daily, Furth, Cadence H, PA-C, 40 mg at 12/28/20 0849 .  sodium chloride flush (NS) 0.9 % injection 3 mL, 3 mL, Intravenous, Q12H, Arida, Muhammad A, MD, 3 mL at 12/27/20 2208 .  sodium chloride flush (NS) 0.9 % injection 3 mL, 3 mL, Intravenous, PRN, Arida, Muhammad A, MD .  traZODone (DESYREL) tablet 50 mg, 50 mg, Oral, QHS, Arida, Muhammad A, MD, 50 mg at 12/27/20 2207 .  umeclidinium-vilanterol (ANORO ELLIPTA) 62.5-25 MCG/INH 1 puff, 1 puff, Inhalation, Daily, Wellington Hampshire, MD, 1 puff at 12/28/20 0850    ALLERGIES   Other     REVIEW OF SYSTEMS    Review of  Systems:  Gen:  Denies  fever, sweats, chills weigh loss  HEENT: Denies blurred vision, double vision, ear pain, eye pain, hearing loss, nose bleeds, sore throat Cardiac:  No dizziness, chest pain or heaviness, chest tightness,edema Resp:   Denies cough or sputum porduction, shortness of breath,wheezing, hemoptysis,  Gi: Denies swallowing difficulty, stomach pain, nausea or vomiting, diarrhea, constipation, bowel incontinence Gu:  Denies bladder incontinence, burning urine Ext:   Denies Joint pain, stiffness or swelling Skin: Denies  skin rash, easy bruising or bleeding or hives Endoc:  Denies polyuria, polydipsia , polyphagia or weight change Psych:   Denies depression, insomnia or hallucinations   Other:  All other systems negative   VS: BP 129/71 (BP Location: Right Leg)   Pulse 93   Temp 97.7 F (36.5 C) (Oral)   Resp 20   Ht 5\' 5"  (1.651 m)   Wt 81 kg   SpO2 99%   BMI 29.72 kg/m      PHYSICAL EXAM    GENERAL:NAD, no fevers, chills, no weakness no fatigue HEAD: Normocephalic, atraumatic.  EYES: Pupils equal, round, reactive to light. Extraocular muscles intact. No scleral icterus.  MOUTH: Moist mucosal membrane. Dentition intact. No abscess noted.  EAR, NOSE, THROAT: Clear without exudates. No external lesions.  NECK: Supple. No thyromegaly. No nodules. No JVD.  PULMONARY: decreased  breath sounds bilaterally CARDIOVASCULAR: S1 and S2. Regular rate and rhythm. No murmurs, rubs, or gallops. No edema. Pedal pulses 2+ bilaterally.  GASTROINTESTINAL: Soft, nontender, nondistended. No masses. Positive bowel sounds. No hepatosplenomegaly.  MUSCULOSKELETAL: No swelling, clubbing, or edema. Range of motion full in all extremities.  NEUROLOGIC: Cranial nerves II through XII are intact. No gross focal neurological deficits. Sensation intact. Reflexes intact.  SKIN: No ulceration, lesions, rashes, or cyanosis. Skin warm and dry. Turgor intact.  PSYCHIATRIC: Mood, affect within normal limits. The patient is awake, alert and oriented x 3. Insight, judgment intact.       IMAGING    DG Chest 2 View  Result Date: 12/24/2020 CLINICAL DATA:  Chest pain EXAM: CHEST - 2 VIEW COMPARISON:  Chest x-ray 11/27/2020, CT chest 11/27/2020 FINDINGS: The heart and mediastinal contours are unchanged with persistent small pericardial effusion. Aortic calcification. Bilateral lower lobe streaky airspace opacities. No pulmonary edema. Likely trace bilateral pleural effusions. No pneumothorax. No acute osseous abnormality. IMPRESSION: 1. Persistent likely small volume pericardial effusion. 2. Trace bilateral pleural effusions not excluded. 3. Bilateral lower lobe streaky airspace opacities. Findings may represent a combination of infection/invasion versus atelectasis. Electronically Signed   By: Iven Finn M.D.   On: 12/24/2020 17:25   CARDIAC CATHETERIZATION  Result Date: 12/25/2020 .  Ost LM to Mid LM lesion is 40% stenosed. Jorene Minors LAD lesion is 80% stenosed. Colon Flattery Cx to Prox Cx lesion is 30% stenosed. .  Ramus lesion is 40% stenosed. .  1st Diag lesion is 100% stenosed. Colon Flattery LAD to Prox LAD lesion is 80% stenosed. .  Prox LAD lesion is 60% stenosed. .  A drug-eluting stent was successfully placed using a STENT ONYX FRONTIER 3.0X26. Marland Kitchen  Post intervention, there is a 0% residual stenosis. Marland Kitchen  Post  intervention, there is a 0% residual stenosis. 1.  Significant ostial and proximal LAD disease.  This was significant by IVUS with a minimal luminal area of 3.5 cm.  Mild ramus and left circumflex disease.  The left circumflex is dominant. 2.  Moderately elevated left ventricular end-diastolic pressure at 22 mmHg. 3.  Successful angioplasty and drug-eluting stent placement to the proximal/ostial LAD with 1 stent. Recommendations: Dual antiplatelet therapy for at least 12 months. Aggressive treatment of risk factors.  Gentle hydration for 6 to 8 hours to prevent contrast-induced nephropathy given significant underlying chronic kidney disease.  The procedure was overall difficult and required 120 mL of contrast.   ECHOCARDIOGRAM COMPLETE  Result Date: 12/26/2020    ECHOCARDIOGRAM REPORT   Patient Name:   Mckenzie Taylor Date of Exam: 12/26/2020 Medical Rec #:  [id number]         Height:       65.0 in Accession #:    [id number]        Weight:       195.8 lb Date of Birth:  Aug 06, 1940        BSA:          1.960 m Patient Age:    70 years          BP:           81/53 mmHg Patient Gender: F                 HR:           101 bpm. Exam Location:  [facility] Procedure: 2D Echo, Color Doppler, Cardiac Doppler and Intracardiac            Opacification Agent Indications:     R07.9 Chest Pain  History:         Patient has prior history of Echocardiogram examinations, most                  recent 09/16/2020. CHF, CAD, CKD and COPD; Risk                  Factors:Hypertension, Dyslipidemia, Diabetes and Former Smoker.  Sonographer:     Charmayne Sheer Referring Phys:  [place] Diagnosing Phys: Ida Rogue MD  Sonographer Comments: Technically difficult study due to poor echo windows. Image acquisition challenging due to COPD. IMPRESSIONS  1. Challenging images  2. Left ventricular ejection fraction, by estimation, is 30 to 35%. The left ventricle has moderately decreased function. The left ventricle demonstrates global  hypokinesis. The left ventricular internal cavity size was mildly dilated. There is mild left ventricular hypertrophy. Left ventricular diastolic parameters are indeterminate.  3. Right ventricular systolic function is normal. The right ventricular size is normal. Tricuspid regurgitation signal is inadequate for assessing PA pressure.  4. A small pericardial effusion is present. FINDINGS  Left Ventricle: Left ventricular ejection fraction, by estimation, is 30 to 35%. The left ventricle has moderately decreased function. The left ventricle demonstrates global hypokinesis. Definity contrast agent was given IV to delineate the left ventricular endocardial borders. The left ventricular internal cavity size was mildly dilated. There is mild left ventricular hypertrophy. Left ventricular diastolic parameters are indeterminate. Right Ventricle: The right ventricular size is normal. No increase in right ventricular wall thickness. Right ventricular systolic function is normal. Tricuspid regurgitation signal is inadequate for assessing PA pressure. Left Atrium: Left atrial size was normal in size. Right Atrium: Right atrial size was normal in size. Pericardium: A small pericardial effusion is present. Mitral Valve: The mitral valve is normal in structure. Moderate mitral annular calcification. No evidence of mitral valve regurgitation. No evidence of mitral valve stenosis. MV peak gradient, 11.0 mmHg. The mean mitral valve gradient is 4.0 mmHg. Tricuspid Valve: The tricuspid valve is normal in structure. Tricuspid valve regurgitation is not  demonstrated. No evidence of tricuspid stenosis. Aortic Valve: The aortic valve was not well visualized. Aortic valve regurgitation is not visualized. No aortic stenosis is present. Aortic valve mean gradient measures 6.0 mmHg. Aortic valve peak gradient measures 9.4 mmHg. Aortic valve area, by VTI measures 2.84 cm. Pulmonic Valve: The pulmonic valve was normal in structure. Pulmonic  valve regurgitation is not visualized. No evidence of pulmonic stenosis. Aorta: The aortic root is normal in size and structure. Venous: The pulmonary veins were not well visualized. The inferior vena cava was not well visualized. The inferior vena cava is normal in size with greater than 50% respiratory variability, suggesting right atrial pressure of 3 mmHg. IAS/Shunts: No atrial level shunt detected by color flow Doppler.  LEFT VENTRICLE PLAX 2D LVIDd:         4.40 cm     Diastology LVIDs:         3.90 cm     LV e' medial:    11.50 cm/s LV PW:         1.50 cm     LV E/e' medial:  13.4 LV IVS:        1.10 cm     LV e' lateral:   11.30 cm/s LVOT diam:     2.20 cm     LV E/e' lateral: 13.6 LV SV:         85 LV SV Index:   43 LVOT Area:     3.80 cm  LV Volumes (MOD) LV vol d, MOD A2C: 90.3 ml LV vol d, MOD A4C: 95.9 ml LV vol s, MOD A2C: 55.2 ml LV vol s, MOD A4C: 60.4 ml LV SV MOD A2C:     35.1 ml LV SV MOD A4C:     95.9 ml LV SV MOD BP:      39.0 ml LEFT ATRIUM             Index LA diam:        4.40 cm 2.24 cm/m LA Vol (A2C):   28.0 ml 14.28 ml/m LA Vol (A4C):   41.3 ml 21.07 ml/m LA Biplane Vol: 33.9 ml 17.29 ml/m  AORTIC VALVE                     PULMONIC VALVE AV Area (Vmax):    3.30 cm      PV Vmax:       0.90 m/s AV Area (Vmean):   2.99 cm      PV Vmean:      65.600 cm/s AV Area (VTI):     2.84 cm      PV VTI:        0.145 m AV Vmax:           153.00 cm/s   PV Peak grad:  3.2 mmHg AV Vmean:          114.000 cm/s  PV Mean grad:  2.0 mmHg AV VTI:            0.298 m AV Peak Grad:      9.4 mmHg AV Mean Grad:      6.0 mmHg LVOT Vmax:         133.00 cm/s LVOT Vmean:        89.700 cm/s LVOT VTI:          0.223 m LVOT/AV VTI ratio: 0.75  AORTA Ao Root diam: 2.70 cm MITRAL VALVE MV Area (PHT): 5.97 cm     SHUNTS  MV Area VTI:   2.97 cm     Systemic VTI:  0.22 m MV Peak grad:  11.0 mmHg    Systemic Diam: 2.20 cm MV Mean grad:  4.0 mmHg MV Vmax:       1.66 m/s MV Vmean:      95.2 cm/s MV Decel Time: 127 msec MV  E velocity: 153.67 cm/s Ida Rogue MD Electronically signed by Ida Rogue MD Signature Date/Time: 12/26/2020/2:47:43 PM    Final       ASSESSMENT/PLAN   Acute Mild exacerbation of COPD  -patient has bronchitic COPD with CT chest showing bronchitic changes  - will start prednisone 20mg  daily with current COPD care path  - PT/OT  CHF/CKD   - noted cardio and renal team input - appreciate collaboration   Multiple comorbid conditions - per [facility] primary team Hypothyroidism Avascular necrosis Anemia Diabetes mellitus Essential HTN    Thank you for allowing me to participate in the care of this patient.  Total face to face encounter time for this patient visit was >20min. >50% of the time was  spent in counseling and coordination of care.   Patient/Family are satisfied with care plan and all questions have been answered.  This document was prepared using Dragon voice recognition software and may include unintentional dictation errors.     Ottie Glazier, M.D.  Division of [place]

## 2020-12-28 NOTE — Progress Notes (Signed)
[facility]  ROUNDING NOTE   Subjective:   Mckenzie Taylor is a 80 year old female with past medical history including anemia of chronic disease, COPD, CAD, hyperlipidemia, diabetes type 2, polycystic kidneys, hypertension, and GERD.  Patient reports to the emergency room with complaints of chest pain and shortness of breath.  Patient was admitted for Chest pain [R07.9] Acute on chronic congestive heart failure, unspecified heart failure type [facility]) [I50.9]  Patient is known to our clinic from previous hospitalizations.  She is followed by Dr. Posey Pronto at the [facility] clinic in [place] and states she was about to be referred to nephrology before this hospitalization.    Patient seen resting in bed, completing breakfast during visit. Denies shortness of breath, nausea, and vomiting. States she feels overall better.  Objective:  Vital signs in last 24 hours:  Temp:  [97.5 F (36.4 C)-99.1 F (37.3 C)] 97.7 F (36.5 C) (10/16 1132) Pulse Rate:  [89-94] 93 (10/16 1132) Resp:  [16-20] 20 (10/16 1132) BP: (101-159)/(50-81) 129/71 (10/16 1132) SpO2:  [98 %-100 %] 99 % (10/16 1132) Weight:  [81 kg] 81 kg (10/16 0545)  Weight change:  Filed Weights   12/24/20 1519 12/26/20 0442 12/28/20 0545  Weight: 88 kg 88.8 kg 81 kg    Intake/Output: I/O last 3 completed shifts: In: 625.4 [P.O.:480; I.V.:145.4] Out: 0    Intake/Output this shift:  Total I/O In: 240 [P.O.:240] Out: 300 [Urine:300]  Physical Exam: General: NAD, resting in bed  Head: Normocephalic, atraumatic. Moist oral mucosal membranes  Eyes: Anicteric  Lungs:  Clear to auscultation, normal effort,  O2  Heart: Regular rate and rhythm  Abdomen:  Soft, nontender, nondistended  Extremities: No peripheral edema.  Neurologic: Nonfocal, moving all four extremities  Skin: No lesions       Basic Metabolic Panel: Recent Labs  Lab 12/24/20 1519 12/26/20 0419 12/27/20 0639 12/28/20 0633  NA 140 136  139 139  K 4.1 4.8 5.3* 5.1  CL 103 103 104 103  CO2 27 26 26 28   GLUCOSE 181* 141* 137* 143*  BUN 27* 33* 38* 38*  CREATININE 2.12* 2.41* 2.83* 2.65*  CALCIUM 10.8* 9.4 9.2 8.6*     Liver Function Tests: No results for input(s): AST, ALT, ALKPHOS, BILITOT, PROT, ALBUMIN in the last 168 hours. No results for input(s): LIPASE, AMYLASE in the last 168 hours. No results for input(s): AMMONIA in the last 168 hours.  CBC: Recent Labs  Lab 12/24/20 1519 12/26/20 0419  WBC 8.5 9.3  HGB 10.2* 9.5*  HCT 32.7* 31.3*  MCV 89.3 91.8  PLT 361 330     Cardiac Enzymes: No results for input(s): CKTOTAL, CKMB, CKMBINDEX, TROPONINI in the last 168 hours.  BNP: Invalid input(s): POCBNP  CBG: Recent Labs  Lab 12/27/20 1119 12/27/20 1610 12/27/20 2143 12/28/20 0749 12/28/20 1128  GLUCAP 171* 147* 128* 133* 172*     Microbiology: Results for orders placed or performed during the hospital encounter of 12/24/20  Resp Panel by RT-PCR (Flu A&B, Covid) Nasopharyngeal Swab     Status: None   Collection Time: 12/24/20  8:02 PM   Specimen: Nasopharyngeal Swab; Nasopharyngeal(NP) swabs in vial transport medium  Result Value Ref Range Status   SARS Coronavirus 2 by RT PCR NEGATIVE NEGATIVE Final    Comment: (NOTE) SARS-CoV-2 target nucleic acids are NOT DETECTED.  The SARS-CoV-2 RNA is generally detectable in upper respiratory specimens during the acute phase of infection. The lowest concentration of SARS-CoV-2 viral copies this  assay can detect is 138 copies/mL. A negative result does not preclude SARS-Cov-2 infection and should not be used as the sole basis for treatment or other patient management decisions. A negative result may occur with  improper specimen collection/handling, submission of specimen other than nasopharyngeal swab, presence of viral mutation(s) within the areas targeted by this assay, and inadequate number of viral copies(<138 copies/mL). A negative result must  be combined with clinical observations, patient history, and epidemiological information. The expected result is Negative.  Fact Sheet for Patients:  EntrepreneurPulse.com.au  Fact Sheet for Healthcare Providers:  IncredibleEmployment.be  This test is no t yet approved or cleared by the [place] FDA and  has been authorized for detection and/or diagnosis of SARS-CoV-2 by FDA under an Emergency Use Authorization (EUA). This EUA will remain  in effect (meaning this test can be used) for the duration of the COVID-19 declaration under Section 564(b)(1) of the Act, 21 U.S.C.section 360bbb-3(b)(1), unless the authorization is terminated  or revoked sooner.       Influenza A by PCR NEGATIVE NEGATIVE Final   Influenza B by PCR NEGATIVE NEGATIVE Final    Comment: (NOTE) The Xpert Xpress SARS-CoV-2/FLU/RSV plus assay is intended as an aid in the diagnosis of influenza from Nasopharyngeal swab specimens and should not be used as a sole basis for treatment. Nasal washings and aspirates are unacceptable for Xpert Xpress SARS-CoV-2/FLU/RSV testing.  Fact Sheet for Patients: EntrepreneurPulse.com.au  Fact Sheet for Healthcare Providers: IncredibleEmployment.be  This test is not yet approved or cleared by the [place] FDA and has been authorized for detection and/or diagnosis of SARS-CoV-2 by FDA under an Emergency Use Authorization (EUA). This EUA will remain in effect (meaning this test can be used) for the duration of the COVID-19 declaration under Section 564(b)(1) of the Act, 21 U.S.C. section 360bbb-3(b)(1), unless the authorization is terminated or revoked.  Performed at [facility], [address] [place], [place] [address]     Coagulation Studies: No results for input(s): LABPROT, INR in the last 72 hours.  Urinalysis: No results for input(s): COLORURINE, LABSPEC, PHURINE,  GLUCOSEU, HGBUR, BILIRUBINUR, KETONESUR, PROTEINUR, UROBILINOGEN, NITRITE, LEUKOCYTESUR in the last 72 hours.  Invalid input(s): APPERANCEUR    Imaging: No results found.   Medications:    sodium chloride      aspirin EC  81 mg Oral Daily   budesonide  0.25 mg Nebulization BID   carvedilol  6.25 mg Oral BID WC   clopidogrel  75 mg Oral Daily   famotidine  20 mg Oral Daily   heparin  5,000 Units Subcutaneous Q8H   insulin aspart  0-9 Units Subcutaneous TID WC   isosorbide mononitrate  30 mg Oral Daily   melatonin  5 mg Oral QHS   [START ON 12/29/2020] predniSONE  20 mg Oral Q breakfast   rosuvastatin  40 mg Oral Daily   sodium chloride flush  3 mL Intravenous Q12H   traZODone  50 mg Oral QHS   umeclidinium-vilanterol  1 puff Inhalation Daily   sodium chloride, acetaminophen, albuterol, calcium carbonate, nitroGLYCERIN, ondansetron (ZOFRAN) IV, sodium chloride flush  Assessment/ Plan:  Mckenzie Taylor is a 80 y.o.  female with past medical history including anemia of chronic disease, COPD, CAD, hyperlipidemia, diabetes type 2, polycystic kidneys, hypertension, and GERD.  Patient reports to the emergency room with complaints of chest pain and shortness of breath.  Patient was admitted for Chest pain [R07.9] Acute on chronic congestive heart failure, unspecified  heart failure type ([place]) [I50.9]   Acute Kidney Injury with hyperkalemia on chronic kidney disease stage 4 with baseline creatinine 1.95 and GFR of 26 on 11/27/20  Acute kidney injury complicated by polycystic kidneys Acute kidney injury secondary to IV contrast nephropathy IV contrast exposure during cardiac cath on 12/25/2020.  No indication for dialysis at this time.  Slight improvement in creatinine overnight with gentle hydration.  Record urine output is 6 occurrences in past 24 hours.  We will continue to monitor  Lab Results  Component Value Date   CREATININE 2.65 (H) 12/28/2020   CREATININE 2.83 (H)  12/27/2020   CREATININE 2.41 (H) 12/26/2020    Intake/Output Summary (Last 24 hours) at 12/28/2020 1248 Last data filed at 12/28/2020 1029 Gross per 24 hour  Intake 625.36 ml  Output 300 ml  Net 325.36 ml    2. Chronic systolic heart failure: Recent ECHO shows Ef 30-35% IV fluids DC'd by primary team  3.  Anemia of chronic disease Hgb 9.5, normocytic  4.  CAD with unstable angina Cardiac cath performed on 12/25/2020 indicates severe LAD and 1 stent placed.  GI cocktail ordered for heartburn.  Plan for aspirin and Plavix therapy for 12 months at discharge   LOS: 4 [place] 10/16/202212:48 PM

## 2020-12-28 NOTE — Progress Notes (Addendum)
PROGRESS NOTE    Mckenzie Taylor  GXQ:[id number] DOB: [birth year] DOA: 12/24/2020 PCP: Denton Lank, MD    Brief Narrative:  Mckenzie Taylor is a 80 y.o. female seen in ed with complaints of chest pain, two days ago. She was out of her NTG.located in middle of chest travelling to both arm, and center of her back.   10/13 s/p cath today. Pt was seen in recovery room 10/14 cr up 2.41 . 10/15 creatinine increased to 2.83.  Nephrology consulted by me this AM. 10/16 feels sob/doe. But feels its her copd  Consultants:  cardiology  Procedures:   Antimicrobials:     Subjective: No cp, no dizziness  Objective: Vitals:   12/28/20 0111 12/28/20 0349 12/28/20 0545 12/28/20 0748  BP: 104/74 101/65  (!) 159/81  Pulse: 91 94  94  Resp: 20 20  17   Temp: 98.2 F (36.8 C) 98.3 F (36.8 C)  (!) 97.5 F (36.4 C)  TempSrc:    Oral  SpO2: 100% 100%  99%  Weight:   81 kg   Height:        Intake/Output Summary (Last 24 hours) at 12/28/2020 0855 Last data filed at 12/27/2020 1847 Gross per 24 hour  Intake 625.36 ml  Output 0 ml  Net 625.36 ml   Filed Weights   12/24/20 1519 12/26/20 0442 12/28/20 0545  Weight: 88 kg 88.8 kg 81 kg    Examination: Calm Increase exp time, no wheezing or rales Regular s1/s2 no gallop Soft benign +bs No edema aaxox3    Data Reviewed: I have personally reviewed following labs and imaging studies  CBC: Recent Labs  Lab 12/24/20 1519 12/26/20 0419  WBC 8.5 9.3  HGB 10.2* 9.5*  HCT 32.7* 31.3*  MCV 89.3 91.8  PLT 361 144   Basic Metabolic Panel: Recent Labs  Lab 12/24/20 1519 12/26/20 0419 12/27/20 0639 12/28/20 0633  NA 140 136 139 139  K 4.1 4.8 5.3* 5.1  CL 103 103 104 103  CO2 27 26 26 28   GLUCOSE 181* 141* 137* 143*  BUN 27* 33* 38* 38*  CREATININE 2.12* 2.41* 2.83* 2.65*  CALCIUM 10.8* 9.4 9.2 8.6*   GFR: Estimated Creatinine Clearance: 18.4 mL/min (A) (by C-G formula based on SCr of 2.65 mg/dL (H)). Liver  Function Tests: No results for input(s): AST, ALT, ALKPHOS, BILITOT, PROT, ALBUMIN in the last 168 hours. No results for input(s): LIPASE, AMYLASE in the last 168 hours. No results for input(s): AMMONIA in the last 168 hours. Coagulation Profile: No results for input(s): INR, PROTIME in the last 168 hours. Cardiac Enzymes: No results for input(s): CKTOTAL, CKMB, CKMBINDEX, TROPONINI in the last 168 hours. BNP (last 3 results) No results for input(s): PROBNP in the last 8760 hours. HbA1C: No results for input(s): HGBA1C in the last 72 hours.  CBG: Recent Labs  Lab 12/27/20 0749 12/27/20 1119 12/27/20 1610 12/27/20 2143 12/28/20 0749  GLUCAP 150* 171* 147* 128* 133*   Lipid Profile: No results for input(s): CHOL, HDL, LDLCALC, TRIG, CHOLHDL, LDLDIRECT in the last 72 hours. Thyroid Function Tests: No results for input(s): TSH, T4TOTAL, FREET4, T3FREE, THYROIDAB in the last 72 hours.  Anemia Panel: No results for input(s): VITAMINB12, FOLATE, FERRITIN, TIBC, IRON, RETICCTPCT in the last 72 hours. Sepsis Labs: Recent Labs  Lab 12/24/20 1721  PROCALCITON 0.22    Recent Results (from the past 240 hour(s))  Resp Panel by RT-PCR (Flu A&B, Covid) Nasopharyngeal Swab     Status: None  Collection Time: 12/24/20  8:02 PM   Specimen: Nasopharyngeal Swab; Nasopharyngeal(NP) swabs in vial transport medium  Result Value Ref Range Status   SARS Coronavirus 2 by RT PCR NEGATIVE NEGATIVE Final    Comment: (NOTE) SARS-CoV-2 target nucleic acids are NOT DETECTED.  The SARS-CoV-2 RNA is generally detectable in upper respiratory specimens during the acute phase of infection. The lowest concentration of SARS-CoV-2 viral copies this assay can detect is 138 copies/mL. A negative result does not preclude SARS-Cov-2 infection and should not be used as the sole basis for treatment or other patient management decisions. A negative result may occur with  improper specimen collection/handling,  submission of specimen other than nasopharyngeal swab, presence of viral mutation(s) within the areas targeted by this assay, and inadequate number of viral copies(<138 copies/mL). A negative result must be combined with clinical observations, patient history, and epidemiological information. The expected result is Negative.  Fact Sheet for Patients:  EntrepreneurPulse.com.au  Fact Sheet for Healthcare Providers:  IncredibleEmployment.be  This test is no t yet approved or cleared by the [place] FDA and  has been authorized for detection and/or diagnosis of SARS-CoV-2 by FDA under an Emergency Use Authorization (EUA). This EUA will remain  in effect (meaning this test can be used) for the duration of the COVID-19 declaration under Section 564(b)(1) of the Act, 21 U.S.C.section 360bbb-3(b)(1), unless the authorization is terminated  or revoked sooner.       Influenza A by PCR NEGATIVE NEGATIVE Final   Influenza B by PCR NEGATIVE NEGATIVE Final    Comment: (NOTE) The Xpert Xpress SARS-CoV-2/FLU/RSV plus assay is intended as an aid in the diagnosis of influenza from Nasopharyngeal swab specimens and should not be used as a sole basis for treatment. Nasal washings and aspirates are unacceptable for Xpert Xpress SARS-CoV-2/FLU/RSV testing.  Fact Sheet for Patients: EntrepreneurPulse.com.au  Fact Sheet for Healthcare Providers: IncredibleEmployment.be  This test is not yet approved or cleared by the [place] FDA and has been authorized for detection and/or diagnosis of SARS-CoV-2 by FDA under an Emergency Use Authorization (EUA). This EUA will remain in effect (meaning this test can be used) for the duration of the COVID-19 declaration under Section 564(b)(1) of the Act, 21 U.S.C. section 360bbb-3(b)(1), unless the authorization is terminated or revoked.  Performed at [facility], [address] [place], [place] [address]          Radiology Studies: ECHOCARDIOGRAM COMPLETE  Result Date: 12/26/2020    ECHOCARDIOGRAM REPORT   Patient Name:   Mckenzie Taylor Date of Exam: 12/26/2020 Medical Rec #:  [id number]         Height:       65.0 in Accession #:    [id number]        Weight:       195.8 lb Date of Birth:  1940/08/27        BSA:          1.960 m Patient Age:    18 years          BP:           81/53 mmHg Patient Gender: F                 HR:           101 bpm. Exam Location:  [facility] Procedure: 2D Echo, Color Doppler, Cardiac Doppler and Intracardiac            Opacification Agent Indications:  R07.9 Chest Pain  History:         Patient has prior history of Echocardiogram examinations, most                  recent 09/16/2020. CHF, CAD, CKD and COPD; Risk                  Factors:Hypertension, Dyslipidemia, Diabetes and Former Smoker.  Sonographer:     Charmayne Sheer Referring Phys:  [place] Diagnosing Phys: Ida Rogue MD  Sonographer Comments: Technically difficult study due to poor echo windows. Image acquisition challenging due to COPD. IMPRESSIONS  1. Challenging images  2. Left ventricular ejection fraction, by estimation, is 30 to 35%. The left ventricle has moderately decreased function. The left ventricle demonstrates global hypokinesis. The left ventricular internal cavity size was mildly dilated. There is mild left ventricular hypertrophy. Left ventricular diastolic parameters are indeterminate.  3. Right ventricular systolic function is normal. The right ventricular size is normal. Tricuspid regurgitation signal is inadequate for assessing PA pressure.  4. A small pericardial effusion is present. FINDINGS  Left Ventricle: Left ventricular ejection fraction, by estimation, is 30 to 35%. The left ventricle has moderately decreased function. The left ventricle demonstrates global hypokinesis. Definity contrast agent was given IV to delineate the left ventricular  endocardial borders. The left ventricular internal cavity size was mildly dilated. There is mild left ventricular hypertrophy. Left ventricular diastolic parameters are indeterminate. Right Ventricle: The right ventricular size is normal. No increase in right ventricular wall thickness. Right ventricular systolic function is normal. Tricuspid regurgitation signal is inadequate for assessing PA pressure. Left Atrium: Left atrial size was normal in size. Right Atrium: Right atrial size was normal in size. Pericardium: A small pericardial effusion is present. Mitral Valve: The mitral valve is normal in structure. Moderate mitral annular calcification. No evidence of mitral valve regurgitation. No evidence of mitral valve stenosis. MV peak gradient, 11.0 mmHg. The mean mitral valve gradient is 4.0 mmHg. Tricuspid Valve: The tricuspid valve is normal in structure. Tricuspid valve regurgitation is not demonstrated. No evidence of tricuspid stenosis. Aortic Valve: The aortic valve was not well visualized. Aortic valve regurgitation is not visualized. No aortic stenosis is present. Aortic valve mean gradient measures 6.0 mmHg. Aortic valve peak gradient measures 9.4 mmHg. Aortic valve area, by VTI measures 2.84 cm. Pulmonic Valve: The pulmonic valve was normal in structure. Pulmonic valve regurgitation is not visualized. No evidence of pulmonic stenosis. Aorta: The aortic root is normal in size and structure. Venous: The pulmonary veins were not well visualized. The inferior vena cava was not well visualized. The inferior vena cava is normal in size with greater than 50% respiratory variability, suggesting right atrial pressure of 3 mmHg. IAS/Shunts: No atrial level shunt detected by color flow Doppler.  LEFT VENTRICLE PLAX 2D LVIDd:         4.40 cm     Diastology LVIDs:         3.90 cm     LV e' medial:    11.50 cm/s LV PW:         1.50 cm     LV E/e' medial:  13.4 LV IVS:        1.10 cm     LV e' lateral:   11.30 cm/s  LVOT diam:     2.20 cm     LV E/e' lateral: 13.6 LV SV:         85 LV SV Index:  43 LVOT Area:     3.80 cm  LV Volumes (MOD) LV vol d, MOD A2C: 90.3 ml LV vol d, MOD A4C: 95.9 ml LV vol s, MOD A2C: 55.2 ml LV vol s, MOD A4C: 60.4 ml LV SV MOD A2C:     35.1 ml LV SV MOD A4C:     95.9 ml LV SV MOD BP:      39.0 ml LEFT ATRIUM             Index LA diam:        4.40 cm 2.24 cm/m LA Vol (A2C):   28.0 ml 14.28 ml/m LA Vol (A4C):   41.3 ml 21.07 ml/m LA Biplane Vol: 33.9 ml 17.29 ml/m  AORTIC VALVE                     PULMONIC VALVE AV Area (Vmax):    3.30 cm      PV Vmax:       0.90 m/s AV Area (Vmean):   2.99 cm      PV Vmean:      65.600 cm/s AV Area (VTI):     2.84 cm      PV VTI:        0.145 m AV Vmax:           153.00 cm/s   PV Peak grad:  3.2 mmHg AV Vmean:          114.000 cm/s  PV Mean grad:  2.0 mmHg AV VTI:            0.298 m AV Peak Grad:      9.4 mmHg AV Mean Grad:      6.0 mmHg LVOT Vmax:         133.00 cm/s LVOT Vmean:        89.700 cm/s LVOT VTI:          0.223 m LVOT/AV VTI ratio: 0.75  AORTA Ao Root diam: 2.70 cm MITRAL VALVE MV Area (PHT): 5.97 cm     SHUNTS MV Area VTI:   2.97 cm     Systemic VTI:  0.22 m MV Peak grad:  11.0 mmHg    Systemic Diam: 2.20 cm MV Mean grad:  4.0 mmHg MV Vmax:       1.66 m/s MV Vmean:      95.2 cm/s MV Decel Time: 127 msec MV E velocity: 153.67 cm/s Ida Rogue MD Electronically signed by Ida Rogue MD Signature Date/Time: 12/26/2020/2:47:43 PM    Final         Scheduled Meds:  aspirin EC  81 mg Oral Daily   budesonide  0.25 mg Nebulization BID   carvedilol  6.25 mg Oral BID WC   clopidogrel  75 mg Oral Daily   famotidine  20 mg Oral Daily   heparin  5,000 Units Subcutaneous Q8H   insulin aspart  0-9 Units Subcutaneous TID WC   isosorbide mononitrate  30 mg Oral Daily   melatonin  5 mg Oral QHS   rosuvastatin  40 mg Oral Daily   sodium chloride flush  3 mL Intravenous Q12H   traZODone  50 mg Oral QHS   umeclidinium-vilanterol  1 puff  Inhalation Daily   Continuous Infusions:  sodium chloride     sodium chloride 50 mL/hr at 12/27/20 1705    Assessment & Plan:   Principal Problem:   Chest pain Active Problems:   GERD (gastroesophageal reflux disease)   HTN (  hypertension)   Diabetes type 2, controlled ([place])   CKD (chronic kidney disease), stage IV ([facility])   CAD (coronary artery disease)   Hypothyroidism   Unstable angina ([place])   Unstable angina with CAD S/p cath on 10/13 found with signifi. Ostial and prox LAD dz. S/p DES to prox/ostial LAD.  Continue Imdur, beta blk, asa, statin, and plavix -cardiology following Repeat echo EF 30 to 35%, global hypokinesis Not a good candidate ACE/ARB/Entresto given renal dysfunction 10/16 no cp Consider adding SGLT2 inhibitor      2. HTN-  Stable Continue isosorbide and carvedilol    3. DL-  Continue statin Goal LDL less than 70     4.DMII-  Continue R-ISS    5.AKI on CKD IV Avoid nephrotoxic meds 10/15 creatinine increasing Possibly due to IV contrast nephropathy Avoid unnecessary nephrotoxic meds And 16 improved some with IV fluids Will DC to prevent volume overload due to systolic dysfunction Nephrology doing Encourage p.o. intake Continue to monitor Will need to follow-up with nephrology as outpatient  6.Hyperkalemia Mild Was given Kayexalate, K5.1 now   7.ICM S/pcath as above Echo from 10/22 with EF 30 to 35% Continue carvedilol, isosorbide Consider SG LT 2 inhibitor Continue I's and O's Continue daily weight   8.COPD with chronic hypoxemia on 3 L nasal cannula at home Managed by primary care Still short of breath I feel as if her shortness of breath at this point is also from her COPD She is supposed to get established with pulmonology as outpatient as she does not have one at this moment Will consult pulmonology   DVT prophylaxis: heparin Code Status:full Family Communication: none at bedside Disposition Plan:  Status is:  Inpatient  Remains inpatient appropriate because:Inpatient level of care appropriate due to severity of illness   Dispo: The patient is from: Home              Anticipated d/c is to: Home              Patient currently is not medically stable to d/c.              Difficult to place patient No    Renal function worsening .Marland Kitchen        LOS: 4 days   Time spent:43 min with >50% on coc    Nolberto Hanlon, MD [facility] Pager [phone]  If 7PM-7AM, please contact night-coverage 12/28/2020, 8:55 AM

## 2020-12-29 DIAGNOSIS — I5021 Acute systolic (congestive) heart failure: Secondary | ICD-10-CM | POA: Diagnosis not present

## 2020-12-29 DIAGNOSIS — I200 Unstable angina: Secondary | ICD-10-CM | POA: Diagnosis not present

## 2020-12-29 LAB — GLUCOSE, CAPILLARY
Glucose-Capillary: 152 mg/dL — ABNORMAL HIGH (ref 70–99)
Glucose-Capillary: 249 mg/dL — ABNORMAL HIGH (ref 70–99)

## 2020-12-29 LAB — BASIC METABOLIC PANEL
Anion gap: 6 (ref 5–15)
BUN: 37 mg/dL — ABNORMAL HIGH (ref 8–23)
CO2: 27 mmol/L (ref 22–32)
Calcium: 8.9 mg/dL (ref 8.9–10.3)
Chloride: 106 mmol/L (ref 98–111)
Creatinine, Ser: 2.29 mg/dL — ABNORMAL HIGH (ref 0.44–1.00)
GFR, Estimated: 21 mL/min — ABNORMAL LOW (ref >60)
Glucose, Bld: 155 mg/dL — ABNORMAL HIGH (ref 70–99)
Potassium: 5.1 mmol/L (ref 3.5–5.1)
Sodium: 139 mmol/L (ref 135–145)

## 2020-12-29 MED ORDER — CARVEDILOL 6.25 MG PO TABS: 6.2500 mg | ORAL_TABLET | Freq: Two times a day (BID) | ORAL | Status: DC

## 2020-12-29 MED ORDER — IPRATROPIUM-ALBUTEROL 0.5-2.5 (3) MG/3ML IN SOLN: 3.0000 mL | Freq: Two times a day (BID) | RESPIRATORY_TRACT | Status: DC

## 2020-12-29 MED ORDER — FAMOTIDINE 20 MG PO TABS: 20.0000 mg | ORAL_TABLET | Freq: Every day | ORAL | 0 refills | Status: AC

## 2020-12-29 MED ORDER — CARVEDILOL 12.5 MG PO TABS: 12.5000 mg | ORAL_TABLET | Freq: Two times a day (BID) | ORAL | 0 refills | Status: AC

## 2020-12-29 MED ORDER — PREDNISONE 20 MG PO TABS: 20.0000 mg | ORAL_TABLET | Freq: Every day | ORAL | 0 refills | Status: AC

## 2020-12-29 MED ORDER — CARVEDILOL 12.5 MG PO TABS: 12.5000 mg | ORAL_TABLET | Freq: Two times a day (BID) | ORAL | Status: DC

## 2020-12-29 MED ORDER — ROSUVASTATIN CALCIUM 40 MG PO TABS: 40.0000 mg | ORAL_TABLET | Freq: Every day | ORAL | 0 refills | Status: AC

## 2020-12-29 MED ORDER — ALBUTEROL SULFATE (2.5 MG/3ML) 0.083% IN NEBU
2.5000 mg | INHALATION_SOLUTION | Freq: Once | RESPIRATORY_TRACT | Status: AC
  Administered 2020-12-29: 2.5 mg via RESPIRATORY_TRACT
  Filled 2020-12-29: qty 3

## 2020-12-29 MED ORDER — ISOSORBIDE MONONITRATE ER 30 MG PO TB24: 30.0000 mg | ORAL_TABLET | Freq: Every day | ORAL | 0 refills | Status: AC

## 2020-12-29 MED ORDER — CLOPIDOGREL BISULFATE 75 MG PO TABS: 75.0000 mg | ORAL_TABLET | Freq: Every day | ORAL | 0 refills | Status: AC

## 2020-12-29 NOTE — Progress Notes (Addendum)
Progress Note  Patient Name: Mckenzie Taylor Date of Encounter: 12/29/2020  Primary Cardiologist: Ida Rogue, MD   Subjective   Feels weak. Has not yet ambulated due to breathing. States she would not be able to breathe if asked to walk around the hall No chest pain. Eating lunch. Does not feel her diuresis was discontinued, reporting that she has been urinating all the time.  Inpatient Medications    Scheduled Meds:  aspirin EC  81 mg Oral Daily   budesonide  0.25 mg Nebulization BID   carvedilol  6.25 mg Oral BID WC   clopidogrel  75 mg Oral Daily   famotidine  20 mg Oral Daily   heparin  5,000 Units Subcutaneous Q8H   insulin aspart  0-9 Units Subcutaneous TID WC   ipratropium-albuterol  3 mL Nebulization BID   isosorbide mononitrate  30 mg Oral Daily   melatonin  5 mg Oral QHS   predniSONE  20 mg Oral Q breakfast   rosuvastatin  40 mg Oral Daily   sodium chloride flush  3 mL Intravenous Q12H   traZODone  50 mg Oral QHS   umeclidinium-vilanterol  1 puff Inhalation Daily   Continuous Infusions:  sodium chloride     PRN Meds: sodium chloride, acetaminophen, albuterol, calcium carbonate, nitroGLYCERIN, ondansetron (ZOFRAN) IV, sodium chloride flush   Vital Signs    Vitals:   12/29/20 0500 12/29/20 0824 12/29/20 0846 12/29/20 1111  BP:   (!) 156/77 123/78  Pulse:  92 93 93  Resp:  16 20 20   Temp:   97.9 F (36.6 C) 97.6 F (36.4 C)  TempSrc:      SpO2:  99% 100% 97%  Weight: 88.5 kg     Height:        Intake/Output Summary (Last 24 hours) at 12/29/2020 1254 Last data filed at 12/29/2020 1033 Gross per 24 hour  Intake 360 ml  Output --  Net 360 ml   Last 3 Weights 12/29/2020 12/28/2020 12/26/2020  Weight (lbs) 195 lb 1.7 oz 178 lb 9.2 oz 195 lb 12.8 oz  Weight (kg) 88.5 kg 81 kg 88.814 kg      Telemetry    SR 90s to low 100s - Personally Reviewed  ECG    No new tracings - Personally Reviewed  Physical Exam   GEN: No acute  distress.  Eating lunch. Joined by family Neck: JVD difficult to assess due to body habitus Cardiac: RRR, 2/6 systolic murmur. No, rubs, or gallops.  Respiratory: Coarse breath sounds bilaterally with faint basilar crackles GI: Soft, nontender, non-distended  MS: mild to moderate bilateral LEE; No deformity. Neuro:  Nonfocal  Psych: Normal affect   Labs    High Sensitivity Troponin:   Recent Labs  Lab 12/24/20 1519 12/24/20 1730  TROPONINIHS 48* 57*      Chemistry Recent Labs  Lab 12/27/20 0639 12/28/20 0633 12/29/20 0415  NA 139 139 139  K 5.3* 5.1 5.1  CL 104 103 106  CO2 26 28 27   GLUCOSE 137* 143* 155*  BUN 38* 38* 37*  CREATININE 2.83* 2.65* 2.29*  CALCIUM 9.2 8.6* 8.9  GFRNONAA 17* 18* 21*  ANIONGAP 9 8 6      Hematology Recent Labs  Lab 12/24/20 1519 12/26/20 0419  WBC 8.5 9.3  RBC 3.66* 3.41*  HGB 10.2* 9.5*  HCT 32.7* 31.3*  MCV 89.3 91.8  MCH 27.9 27.9  MCHC 31.2 30.4  RDW 16.8* 16.7*  PLT 361 330  BNP Recent Labs  Lab 12/24/20 1519  BNP 944.1*     DDimer No results for input(s): DDIMER in the last 168 hours.   Radiology    No results found.  Cardiac Studies   Echo 12/26/20  1. Challenging images   2. Left ventricular ejection fraction, by estimation, is 30 to 35%. The  left ventricle has moderately decreased function. The left ventricle  demonstrates global hypokinesis. The left ventricular internal cavity size  was mildly dilated. There is mild  left ventricular hypertrophy. Left ventricular diastolic parameters are  indeterminate.   3. Right ventricular systolic function is normal. The right ventricular  size is normal. Tricuspid regurgitation signal is inadequate for assessing  PA pressure.   4. A small pericardial effusion is present.   LHC   Ost LM to Mid LM lesion is 40% stenosed.   Dist LAD lesion is 80% stenosed.   Ost Cx to Prox Cx lesion is 30% stenosed.   Ramus lesion is 40% stenosed.   1st Diag lesion is  100% stenosed.   Ost LAD to Prox LAD lesion is 80% stenosed.   Prox LAD lesion is 60% stenosed.   A drug-eluting stent was successfully placed using a STENT ONYX FRONTIER [id number].   Post intervention, there is a 0% residual stenosis.   Post intervention, there is a 0% residual stenosis.   1.  Significant ostial and proximal LAD disease.  This was significant by IVUS with a minimal luminal area of 3.5 cm.  Mild ramus and left circumflex disease.  The left circumflex is dominant. 2.  Moderately elevated left ventricular end-diastolic pressure at 22 mmHg. 3.  Successful angioplasty and drug-eluting stent placement to the proximal/ostial LAD with 1 stent.   Recommendations: Dual antiplatelet therapy for at least 12 months. Aggressive treatment of risk factors.  Gentle hydration for 6 to 8 hours to prevent contrast-induced nephropathy given significant underlying chronic kidney disease.  The procedure was overall difficult and required 120 mL of contrast.  Coronary Diagrams  Diagnostic Dominance: Left Intervention    Patient Profile     80 y.o. female with hx of  mild nonobstructive CAD (cath 2004), hypertension, hyperlipidemia, HFrEF, COPD on 3L home oxygen, GERD with B12 deficiency, hypothyroidism (bx 2010, subclinical), CKD, autosomal recessive PCKD (h/o adrenal adenoma, 2010), anemia of chronic disease, DM2, history of bilateral avascular necrosis of the hip, 5cm popliteal fossa cyst, and prior history of smoking 1 pack a day since the age of 37 (quit ~ 2015) who is being seen today after recent LHC for CP and with contrast induced AKI .  Assessment & Plan    CAD s/p LHC with PCI to the LAD Ischemic cardiomyopathy --No current chest pain.  S/p LHC with DES to the LAD. DAPT for least 12 months with ASA and clopidogrel Continue Coreg, Imdur, statin, as needed sublingual nitro for chest pain. Increased dose of Coreg dose for additional BP/HR support and if breathing status allows. Will  need to work towards ambulation before discharge Will need scheduled for outpatient follow-up before discharge  Acute on chronic HFrEF --Reports breathing about the same as yesterday with significant dyspnea with ambulation.  Echo shows EF 30 to 35%.  Cath with LVEDP significantly elevated.  Diuresis held due to contrast-induced nephropathy complicated by CKD/polycystic kidneys.  Renal function has improved with holding diuresis / gentle hydration yet still not at baseline. Per nephrology, baseline Cr 1.95. Volume up on exam today. Tentative plan for IV lasix 40mg  BID  with close monitoring of renal function recommended pending IM response regarding this plan.  Will increase Coreg for additional HR/BP support if breathing status tolerates. Not on ACE/ARB/Arni/MRA secondary to CKD.  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  Baseline creatinine documented as 1.95.  Per nephrology, no indication for dialysis at this time.  Diuresis has been held in the setting of contrast-induced nephropathy.  HTN  --Continue current medications as above  HLD --Continue statin  DM2 --SSI, per IM  COPD --Evaluated by pulmonology with bronchitic COPD and CT showing bronchitic changes.  Started on prednisone 20 mg daily.  Caution with steroids in the setting of current volume status, wean as tolerated.  For questions or updates, please contact [place] Please consult [email or web address] for contact info under        Signed, Arvil Chaco, PA-C  12/29/2020, 12:54 PM

## 2020-12-29 NOTE — Evaluation (Signed)
Occupational Therapy Evaluation Patient Details Name: LEOLIA VINZANT MRN: [id number] DOB: 02-21-1941 Today's Date: 12/29/2020   History of Present Illness 80 years old who presented to ED with complaints of chest pain shortness of breath, and nausea.  She was acutely hypoxemic and requiring increased oxygen via nasal cannula. Pt is now s/p cardiac catherization (10/13). Past medical history: coronary artery disease, COPD (on 2-3L at baseline), CHF, NSTEMI, anemia, chronic kidney disease, diabetes type 2, GERD, dyslipidemia, essential hypertension, and hypothyroidism.   Clinical Impression   Pt seen for OT evaluation this date. Prior to admission, pt was independent with ADLs and functional mobility of short household distances (walking a MAX of ~60ft ), living in a 1-story home with cousin. Pt reports using 3L/min of O2 at baseline. Pt denies falls hx. Pt currently requires MIN GUARD for seated LB dressing, SUPERVISION for functional mobility of short household distances without AD, SUPERVISION for toilet transfers/hygiene, and SUPERVISION for standing grooming tasks due to current functional impairments (See OT Problem List below). While on 2L/min of of supplemental O2, SpO2 96% and HR 90s after each bout of functional mobility. During session, pt able to independently implement energy conservation strategies (i.e., pacing, PLB, and adaptive positioning). Pt appears to be nearing baseline functional independence. Upon discharge, recommend no OT follow-up.     Recommendations for follow up therapy are one component of a multi-disciplinary discharge planning process, led by the attending physician.  Recommendations may be updated based on patient status, additional functional criteria and insurance authorization.   Follow Up Recommendations  No OT follow up;Supervision - Intermittent    Equipment Recommendations  None recommended by OT       Precautions / Restrictions  Precautions Precautions: None Restrictions Weight Bearing Restrictions: No      Mobility Bed Mobility Overal bed mobility: Modified Independent             General bed mobility comments: Able to perform with HOB elevated    Transfers Overall transfer level: Needs assistance Equipment used: None Transfers: Sit to/from Stand Sit to Stand: Supervision         General transfer comment: SUPERVISION d/t pt reporting hx of dizziness with change in position; pt denied dizziness this date    Balance Overall balance assessment: Needs assistance Sitting-balance support: No upper extremity supported;Feet supported Sitting balance-Leahy Scale: Good Sitting balance - Comments: Good sitting balance at EOB during LB dressing   Standing balance support: No upper extremity supported;During functional activity Standing balance-Leahy Scale: Fair Standing balance comment: SUPERVISION for functional mobility of short household distances                           ADL either performed or assessed with clinical judgement   ADL Overall ADL's : Needs assistance/impaired     Grooming: Wash/dry hands;Wash/dry face;Supervision/safety;Set up;Standing               Lower Body Dressing: Min guard;Sitting/lateral leans Lower Body Dressing Details (indicate cue type and reason): to don/doff socks while seated EOB Toilet Transfer: Supervision/safety;Ambulation;BSC   Toileting- Water quality scientist and Hygiene: Supervision/safety;Set up;Sitting/lateral lean       Functional mobility during ADLs: Supervision/safety       Vision Patient Visual Report: No change from baseline              Pertinent Vitals/Pain Pain Assessment: No/denies pain     Hand Dominance Right   Extremity/Trunk Assessment Upper Extremity Assessment Upper  Extremity Assessment: Overall WFL for tasks assessed   Lower Extremity Assessment Lower Extremity Assessment: Overall WFL for tasks  assessed;Defer to PT evaluation       Communication Communication Communication: No difficulties   Cognition Arousal/Alertness: Awake/alert Behavior During Therapy: WFL for tasks assessed/performed Overall Cognitive Status: Within Functional Limits for tasks assessed                                     General Comments  SpO2 96% after each bout of functional mobility while on 2L/min of supplemental O2. HR 90s throughout session    Exercises Other Exercises Other Exercises: Pt able to independently initiate energy conservation strategies (i.e., pacing, PLB, and adaptive positioning) during session        Home Living Family/patient expects to be discharged to:: Private residence Living Arrangements: Other relatives (cousin Caryl Pina)) Available Help at Discharge: Family;Available 24 hours/day;Available PRN/intermittently (Cousin available 24/7. Son and ex husband available PRN) Type of Home: House Home Access: Stairs to enter [employer] of Steps: 3 from front, level entry from back door   Home Layout: One level     Bathroom Shower/Tub: Tub/shower unit         Home Equipment: Environmental consultant - 2 wheels;Walker - 4 wheels;Cane - single point;Cane - quad;Bedside commode;Tub bench;Hand held shower head;Grab bars - tub/shower   Additional Comments: Uses 3L of supplemental O2 at home      Prior Functioning/Environment Level of Independence: Independent        Comments: Independent with ADLs (SET-UP assist for spongebathing) and functional mobility of short household distance (~32ft). Pt has AD for mobility but doesnt use it. Denies falls hx. Prepares simple meals. Cousin assists with other IADLs        OT Problem List: Decreased activity tolerance;Impaired balance (sitting and/or standing);Cardiopulmonary status limiting activity      OT Treatment/Interventions:      OT Goals(Current goals can be found in the care plan section) Acute Rehab OT  Goals Patient Stated Goal: to go home OT Goal Formulation: With patient Time For Goal Achievement: 01/12/21 Potential to Achieve Goals: Good  OT Frequency:      AM-PAC OT "6 Clicks" Daily Activity     Outcome Measure Help from another person eating meals?: None Help from another person taking care of personal grooming?: A Little Help from another person toileting, which includes using toliet, bedpan, or urinal?: A Little Help from another person bathing (including washing, rinsing, drying)?: A Little Help from another person to put on and taking off regular upper body clothing?: None Help from another person to put on and taking off regular lower body clothing?: A Little 6 Click Score: 20   End of Session Nurse Communication: Mobility status  Activity Tolerance: Patient tolerated treatment well Patient left: in bed;with call bell/phone within reach;Other (comment) (with PT)  OT Visit Diagnosis: Unsteadiness on feet (R26.81)                Time: 1350-1420 OT Time Calculation (min): 30 min Charges:  OT General Charges $OT Visit: 1 Visit OT Evaluation $OT Eval Moderate Complexity: 1 Mod OT Treatments $Self Care/Home Management : 23-37 mins  Fredirick Maudlin, OTR/L [place]

## 2020-12-29 NOTE — Progress Notes (Signed)
[facility]  ROUNDING NOTE   Subjective:   Mckenzie Taylor is a 80 year old female with past medical history including anemia of chronic disease, COPD, CAD, hyperlipidemia, diabetes type 2, polycystic kidneys, hypertension, and GERD.  Patient reports to the emergency room with complaints of chest pain and shortness of breath.  Patient was admitted for Chest pain [R07.9] Acute on chronic congestive heart failure, unspecified heart failure type [facility]) [I50.9]  Patient is known to our clinic from previous hospitalizations.  She is followed by Dr. Posey Pronto at the [facility] clinic in [place] and states she was about to be referred to nephrology before this hospitalization.    Creatinine improved today Reports trouble breathing overnight and needing her inhaler more frequently.    Objective:  Vital signs in last 24 hours:  Temp:  [97.6 F (36.4 C)-97.9 F (36.6 C)] 97.6 F (36.4 C) (10/17 1111) Pulse Rate:  [92-96] 93 (10/17 1111) Resp:  [16-24] 20 (10/17 1111) BP: (123-157)/(76-84) 123/78 (10/17 1111) SpO2:  [97 %-100 %] 97 % (10/17 1111) Weight:  [88.5 kg] 88.5 kg (10/17 0500)  Weight change: 7.5 kg Filed Weights   12/26/20 0442 12/28/20 0545 12/29/20 0500  Weight: 88.8 kg 81 kg 88.5 kg    Intake/Output: I/O last 3 completed shifts: In: 360 [P.O.:360] Out: 300 [Urine:300]   Intake/Output this shift:  Total I/O In: 480 [P.O.:480] Out: -   Physical Exam: General: NAD, resting in bed  Head: Normocephalic, atraumatic. Moist oral mucosal membranes  Eyes: Anicteric  Lungs:  Clear to auscultation, normal effort, [place] O2  Heart: Regular rate and rhythm  Abdomen:  Soft, nontender, nondistended  Extremities: No peripheral edema.  Neurologic: Nonfocal, moving all four extremities  Skin: No lesions       Basic Metabolic Panel: Recent Labs  Lab 12/24/20 1519 12/26/20 0419 12/27/20 0639 12/28/20 0633 12/29/20 0415  NA 140 136 139 139 139  K 4.1 4.8 5.3*  5.1 5.1  CL 103 103 104 103 106  CO2 27 26 26 28 27   GLUCOSE 181* 141* 137* 143* 155*  BUN 27* 33* 38* 38* 37*  CREATININE 2.12* 2.41* 2.83* 2.65* 2.29*  CALCIUM 10.8* 9.4 9.2 8.6* 8.9     Liver Function Tests: No results for input(s): AST, ALT, ALKPHOS, BILITOT, PROT, ALBUMIN in the last 168 hours. No results for input(s): LIPASE, AMYLASE in the last 168 hours. No results for input(s): AMMONIA in the last 168 hours.  CBC: Recent Labs  Lab 12/24/20 1519 12/26/20 0419  WBC 8.5 9.3  HGB 10.2* 9.5*  HCT 32.7* 31.3*  MCV 89.3 91.8  PLT 361 330     Cardiac Enzymes: No results for input(s): CKTOTAL, CKMB, CKMBINDEX, TROPONINI in the last 168 hours.  BNP: Invalid input(s): POCBNP  CBG: Recent Labs  Lab 12/28/20 1545 12/28/20 2015 12/28/20 2122 12/29/20 0849 12/29/20 1244  GLUCAP 148* 169* 163* 152* 249*     Microbiology: Results for orders placed or performed during the hospital encounter of 12/24/20  Resp Panel by RT-PCR (Flu A&B, Covid) Nasopharyngeal Swab     Status: None   Collection Time: 12/24/20  8:02 PM   Specimen: Nasopharyngeal Swab; Nasopharyngeal(NP) swabs in vial transport medium  Result Value Ref Range Status   SARS Coronavirus 2 by RT PCR NEGATIVE NEGATIVE Final    Comment: (NOTE) SARS-CoV-2 target nucleic acids are NOT DETECTED.  The SARS-CoV-2 RNA is generally detectable in upper respiratory specimens during the acute phase of infection. The lowest concentration of  SARS-CoV-2 viral copies this assay can detect is 138 copies/mL. A negative result does not preclude SARS-Cov-2 infection and should not be used as the sole basis for treatment or other patient management decisions. A negative result may occur with  improper specimen collection/handling, submission of specimen other than nasopharyngeal swab, presence of viral mutation(s) within the areas targeted by this assay, and inadequate number of viral copies(<138 copies/mL). A negative result  must be combined with clinical observations, patient history, and epidemiological information. The expected result is Negative.  Fact Sheet for Patients:  EntrepreneurPulse.com.au  Fact Sheet for Healthcare Providers:  IncredibleEmployment.be  This test is no t yet approved or cleared by the [place] FDA and  has been authorized for detection and/or diagnosis of SARS-CoV-2 by FDA under an Emergency Use Authorization (EUA). This EUA will remain  in effect (meaning this test can be used) for the duration of the COVID-19 declaration under Section 564(b)(1) of the Act, 21 U.S.C.section 360bbb-3(b)(1), unless the authorization is terminated  or revoked sooner.       Influenza A by PCR NEGATIVE NEGATIVE Final   Influenza B by PCR NEGATIVE NEGATIVE Final    Comment: (NOTE) The Xpert Xpress SARS-CoV-2/FLU/RSV plus assay is intended as an aid in the diagnosis of influenza from Nasopharyngeal swab specimens and should not be used as a sole basis for treatment. Nasal washings and aspirates are unacceptable for Xpert Xpress SARS-CoV-2/FLU/RSV testing.  Fact Sheet for Patients: EntrepreneurPulse.com.au  Fact Sheet for Healthcare Providers: IncredibleEmployment.be  This test is not yet approved or cleared by the [place] FDA and has been authorized for detection and/or diagnosis of SARS-CoV-2 by FDA under an Emergency Use Authorization (EUA). This EUA will remain in effect (meaning this test can be used) for the duration of the COVID-19 declaration under Section 564(b)(1) of the Act, 21 U.S.C. section 360bbb-3(b)(1), unless the authorization is terminated or revoked.  Performed at [facility], [address] [place], [place] [address]     Coagulation Studies: No results for input(s): LABPROT, INR in the last 72 hours.  Urinalysis: No results for input(s): COLORURINE, LABSPEC, PHURINE,  GLUCOSEU, HGBUR, BILIRUBINUR, KETONESUR, PROTEINUR, UROBILINOGEN, NITRITE, LEUKOCYTESUR in the last 72 hours.  Invalid input(s): APPERANCEUR    Imaging: No results found.   Medications:    sodium chloride      aspirin EC  81 mg Oral Daily   budesonide  0.25 mg Nebulization BID   carvedilol  12.5 mg Oral BID WC   clopidogrel  75 mg Oral Daily   famotidine  20 mg Oral Daily   heparin  5,000 Units Subcutaneous Q8H   insulin aspart  0-9 Units Subcutaneous TID WC   ipratropium-albuterol  3 mL Nebulization BID   isosorbide mononitrate  30 mg Oral Daily   melatonin  5 mg Oral QHS   predniSONE  20 mg Oral Q breakfast   rosuvastatin  40 mg Oral Daily   sodium chloride flush  3 mL Intravenous Q12H   traZODone  50 mg Oral QHS   umeclidinium-vilanterol  1 puff Inhalation Daily   sodium chloride, acetaminophen, albuterol, calcium carbonate, nitroGLYCERIN, ondansetron (ZOFRAN) IV, sodium chloride flush  Assessment/ Plan:  Ms. LAKYA SCHRUPP is a 80 y.o.  female with past medical history including anemia of chronic disease, COPD, CAD, hyperlipidemia, diabetes type 2, polycystic kidneys, hypertension, and GERD.  Patient reports to the emergency room with complaints of chest pain and shortness of breath.  Patient was admitted for Chest  pain [R07.9] Acute on chronic congestive heart failure, unspecified heart failure type ([place]) [I50.9]   Acute Kidney Injury with hyperkalemia on chronic kidney disease stage 4 with baseline creatinine 1.95 and GFR of 26 on 11/27/20  Acute kidney injury complicated by polycystic kidneys Acute kidney injury secondary to IV contrast nephropathy IV contrast exposure during cardiac cath on 12/25/2020.  No indication for dialysis at this time.  Creatinine improved. Continue to encourage oral nutrition. Cleared to discharge from our stance. Will schedule hospital follow up with our office.   Lab Results  Component Value Date   CREATININE 2.29 (H) 12/29/2020    CREATININE 2.65 (H) 12/28/2020   CREATININE 2.83 (H) 12/27/2020    Intake/Output Summary (Last 24 hours) at 12/29/2020 1440 Last data filed at 12/29/2020 1340 Gross per 24 hour  Intake 600 ml  Output --  Net 600 ml    2. Chronic systolic heart failure: Recent ECHO shows Ef 30-35%  3.  Anemia of chronic disease: normocytic  4.  CAD with unstable angina Cardiac cath performed on 12/25/2020 indicates severe LAD and 1 stent placed.  GI cocktail ordered for heartburn.  Plan for aspirin and Plavix therapy for 12 months at discharge   LOS: 5 [place] 10/17/20222:40 PM

## 2020-12-29 NOTE — Evaluation (Signed)
Physical Therapy Evaluation Patient Details Name: Mckenzie Taylor MRN: [id number] DOB: [birth year] Today's Date: 12/29/2020  History of Present Illness  Pt is a 80 years old who presented to ED with complaints of chest pain shortness of breath, and nausea.  She was acutely hypoxemic and requiring increased oxygen via nasal cannula. Pt is now s/p cardiac catherization (10/13) and angioplasty with drug-eluting stent placement to the proximal/ostial LAD with 1 stent. MD assessment also includes Acute Kidney Injury with hyperkalemia on chronic kidney disease stage 4. PMH includes coronary artery disease, COPD (on 2-3L at baseline), CHF, NSTEMI, anemia, chronic kidney disease, diabetes type 2, GERD, dyslipidemia, essential hypertension, and hypothyroidism.   Clinical Impression  Pt was pleasant and motivated to participate during the session and overall performed well during the session.  Pt required no physical assistance during the session and was steady with good control with transfers and gait without an AD.  Pt ambulated a self-selected distance of 80 feet with "medium" SOB with SpO2 and HR WNL on 2LO2/min.  Pt's SOB resolved quickly upon returning to sitting and pt was able to hold conversation at all times during activity.  Pt currently found to be at her baseline level of function with no skilled PT needs at this time.  Will complete PT orders at this time but will reassess pt pending a change in status upon receipt of new PT orders.         Recommendations for follow up therapy are one component of a multi-disciplinary discharge planning process, led by the attending physician.  Recommendations may be updated based on patient status, additional functional criteria and insurance authorization.  Follow Up Recommendations No PT follow up    Equipment Recommendations  None recommended by PT    Recommendations for Other Services       Precautions / Restrictions Precautions Precautions:  None Restrictions Weight Bearing Restrictions: No      Mobility  Bed Mobility Overal bed mobility: Independent             General bed mobility comments: Able to perform with HOB elevated    Transfers Overall transfer level: Independent Equipment used: None Transfers: Sit to/from Stand Sit to Stand: Independent         General transfer comment: Good eccentric and concentric control and stability  Ambulation/Gait Ambulation/Gait assistance: Supervision Gait Distance (Feet): 80 Feet Assistive device: None Gait Pattern/deviations: Step-through pattern;Decreased step length - right;Decreased step length - left Gait velocity: decreased   General Gait Details: Slow cadence but steady without LOB  Stairs            Wheelchair Mobility    Modified Rankin (Stroke Patients Only)       Balance Overall balance assessment: No apparent balance deficits (not formally assessed) Sitting-balance support: No upper extremity supported;Feet supported Sitting balance-Leahy Scale: Good Sitting balance - Comments: Good sitting balance at EOB during LB dressing   Standing balance support: No upper extremity supported;During functional activity Standing balance-Leahy Scale: Fair Standing balance comment: SUPERVISION for functional mobility of short household distances                             Pertinent Vitals/Pain Pain Assessment: No/denies pain    Home Living Family/patient expects to be discharged to:: Private residence Living Arrangements: Other relatives (cousin) Available Help at Discharge: Family;Available 24 hours/day Type of Home: House Home Access: Level entry   Entrance Stairs-Number of Steps: 3  from front, level entry from back door Home Layout: One level Home Equipment: [place] - 2 wheels;Walker - 4 wheels;Cane - single point;Cane - quad;Bedside commode;Tub bench;Hand held shower head;Grab bars - tub/shower Additional Comments: Pt lives with  cousin who provides 24/7 supervision; pt's ex-spouse was an EMT and also checks on pt daily    Prior Function Level of Independence: Needs assistance   Gait / Transfers Assistance Needed: Ind amb in home without an AD, RW for limited community distances, no fall history  ADL's / Homemaking Assistance Needed: Cousin assists with shower and clothes set-up but pt dresses and bathes independently  Comments: Independent with ADLs (SET-UP assist for spongebathing) and functional mobility of short household distance (~60ft). Pt has AD for mobility but doesnt use it. Denies falls hx. Prepares simple meals. Cousin assists with other IADLs     Hand Dominance   Dominant Hand: Right    Extremity/Trunk Assessment   Upper Extremity Assessment Upper Extremity Assessment: Overall WFL for tasks assessed    Lower Extremity Assessment Lower Extremity Assessment: Overall WFL for tasks assessed       Communication   Communication: No difficulties  Cognition Arousal/Alertness: Awake/alert Behavior During Therapy: WFL for tasks assessed/performed Overall Cognitive Status: Within Functional Limits for tasks assessed                                        General Comments General comments (skin integrity, edema, etc.): SpO2 96% after each bout of functional mobility while on 2L/min of supplemental O2. HR 90s throughout session    Exercises Other Exercises Other Exercises: Pt education provided on principles of activity progression, RPE, and dyspnea spiral   Assessment/Plan    PT Assessment Patent does not need any further PT services  PT Problem List         PT Treatment Interventions      PT Goals (Current goals can be found in the Care Plan section)  Acute Rehab PT Goals Patient Stated Goal: to go home PT Goal Formulation: All assessment and education complete, DC therapy    Frequency     Barriers to discharge        Co-evaluation               AM-PAC PT  "6 Clicks" Mobility  Outcome Measure Help needed turning from your back to your side while in a flat bed without using bedrails?: None Help needed moving from lying on your back to sitting on the side of a flat bed without using bedrails?: None Help needed moving to and from a bed to a chair (including a wheelchair)?: None Help needed standing up from a chair using your arms (e.g., wheelchair or bedside chair)?: None Help needed to walk in hospital room?: A Little Help needed climbing 3-5 steps with a railing? : A Little 6 Click Score: 22    End of Session Equipment Utilized During Treatment: Gait belt Activity Tolerance: Patient tolerated treatment well Patient left: in bed;with call bell/phone within reach Nurse Communication: Mobility status PT Visit Diagnosis: Muscle weakness (generalized) (M62.81)    Time: 1610-9604 PT Time Calculation (min) (ACUTE ONLY): 25 min   Charges:   PT Evaluation $PT Eval Low Complexity: 1 Low PT Treatments $Therapeutic Activity: 8-22 mins        D. Royetta Asal PT, DPT 12/29/20, 4:16 PM

## 2020-12-29 NOTE — Discharge Summary (Signed)
Mckenzie Taylor DOB: 08-Aug-1940 DOA: 12/24/2020  PCP: Denton Lank, MD  Admit date: 12/24/2020 Discharge date: 12/30/2020  Admitted From: home Disposition:  home  Recommendations for Outpatient Follow-up:  Follow up with PCP in 1 week Please obtain BMP/CBC in one week Please follow up with nephrology in 1 week Follow-up with cardiology in 1 week  Follow-up with pulmonology in 1 week   Discharge Condition:Stable CODE STATUS: Full Diet recommendation: Heart Healthy / Carb Modified  Brief/Interim Summary: Per Mckenzie Taylor is a 80 y.o. female seen in ed with complaints of chest pain, two days ago. She was out of her NTG.located in middle of chest travelling to both arm, and center of her back.  C/p 10/10 . Sharp indigestion and it burns as well.sob+/ nausea. She felt like she did when she had MI.  Patient was admitted with unstable angina with coronary artery disease.  Cardiology was consulted. She underwent cardiac cath with results below.  Post cath she developed contrast-induced nephropathy.  Nephrology was consulted and patient was started on gentle IV fluid for hydration.  Creatinine improved some.  She continued having some dyspnea on exertion and PCCM was consulted.  They felt she has acute mild exacerbation of COPD and started on prednisone 20 mg daily to be continued until follows up with pulmonology.   Unstable angina with CAD S/p cath on 10/13 found with signifi. Ostial and prox LAD dz. S/p DES to prox/ostial LAD.  Continue Imdur, beta blk, asa, statin, and plavix Follow-up with cardiology Follow-up with CHF clinic Repeat echo EF 30 to 35%, global hypokinesis Not a good candidate ACE/ARB/Entresto given renal dysfunction Consider adding SGLT2 inhibitor as outpatient           2. HTN-  Stable Continue isosorbide and carvedilol       3. DL-  Continue statin Goal LDL less than 70         4.Diabetes mellitus type 2 uncontrolled with  hyperglycemia  Continue home meds       5.AKI on CKD IV Avoid nephrotoxic meds 10/15 creatinine increased Possibly due to IV contrast nephropathy Improved with IV fluids  Needs to follow-up with nephrology as outpatient nephrology doing    6.Hyperkalemia Mild Was given Kayexalate Will need to check labs in few days by PCP     7.ICM S/pcath as above Echo from 10/22 with EF 30 to 35% Continue carvedilol, isosorbide Consider SG LT 2 inhibitor as outpatient      8.acute mild exacerbation COPD with chronic hypoxemia on 3 L nasal cannula at home Managed by primary care Pulmonology was consulted Was started on prednisone 20 mg daily- until f/u with pccm Will need to follow-up with pulmonology as outpatient        Discharge Diagnoses:  Principal Problem:   Chest pain Active Problems:   GERD (gastroesophageal reflux disease)   HTN (hypertension)   Diabetes type 2, controlled (Mckenzie Taylor)   CKD (chronic kidney disease), stage IV (Mckenzie Taylor)   CAD (coronary artery disease)   Hypothyroidism   Unstable angina Mckenzie Taylor)    Discharge Instructions  Discharge Instructions     AMB Referral to Cardiac Rehabilitation - Phase II   Complete by: As directed    Diagnosis: Coronary Stents   After initial evaluation and assessments completed: Virtual Based Care may be provided alone or in conjunction with Phase 2 Cardiac Rehab based on patient barriers.: Yes   AMB referral to CHF clinic   Complete by: As directed  Call MD for:  difficulty breathing, headache or visual disturbances   Complete by: As directed    Call MD for:  severe uncontrolled pain   Complete by: As directed    Diet - low sodium heart healthy   Complete by: As directed    Increase activity slowly   Complete by: As directed       Allergies as of 12/29/2020       Reactions   Other Rash   Pt reports allergy to metals. Patient also reports allergic to "ice" and it makes her skin swell where it touches.  She states she  can drink water with no issues.        Medication List     STOP taking these medications    amLODipine 10 MG tablet Commonly known as: NORVASC   budesonide 0.25 MG/2ML nebulizer solution Commonly known as: PULMICORT   iron polysaccharides 150 MG capsule Commonly known as: NIFEREX   metolazone 2.5 MG tablet Commonly known as: ZAROXOLYN   omeprazole 20 MG capsule Commonly known as: PRILOSEC   torsemide 20 MG tablet Commonly known as: DEMADEX       TAKE these medications    albuterol 108 (90 Base) MCG/ACT inhaler Commonly known as: VENTOLIN HFA Inhale 2 puffs into the lungs every 4 (four) hours as needed for wheezing or shortness of breath.   aspirin 81 MG EC tablet Take 1 tablet (81 mg total) by mouth daily.   busPIRone 5 MG tablet Commonly known as: BUSPAR Take 5 mg by mouth 2 (two) times daily.   carvedilol 12.5 MG tablet Commonly known as: COREG Take 1 tablet (12.5 mg total) by mouth 2 (two) times daily with a meal. What changed:  medication strength how much to take   clopidogrel 75 MG tablet Commonly known as: PLAVIX Take 1 tablet (75 mg total) by mouth daily.   Cyanocobalamin 1500 MCG Tbdp Take 4,500 mcg by mouth daily.   famotidine 20 MG tablet Commonly known as: PEPCID Take 1 tablet (20 mg total) by mouth daily.   Fluticasone-Umeclidin-Vilant 100-62.5-25 MCG/INH Aepb Inhale 1 puff into the lungs daily.   insulin detemir 100 UNIT/ML FlexPen Commonly known as: LEVEMIR Inject 42 Units into the skin at bedtime.   insulin lispro 100 UNIT/ML KwikPen Commonly known as: HUMALOG Inject 0-22 Units into the skin 3 (three) times daily with meals. Sliding scale   isosorbide mononitrate 30 MG 24 hr tablet Commonly known as: IMDUR Take 1 tablet (30 mg total) by mouth daily.   nitroGLYCERIN 0.4 MG SL tablet Commonly known as: NITROSTAT Place 1 tablet (0.4 mg total) under the tongue every 5 (five) minutes as needed for chest pain.    oxyCODONE-acetaminophen 5-325 MG tablet Commonly known as: PERCOCET/ROXICET Take 1 tablet by mouth at bedtime.   predniSONE 20 MG tablet Commonly known as: DELTASONE Take 1 tablet (20 mg total) by mouth daily with breakfast.   promethazine 25 MG tablet Commonly known as: PHENERGAN Take 25 mg by mouth every 6 (six) hours as needed for nausea.   rosuvastatin 40 MG tablet Commonly known as: CRESTOR Take 1 tablet (40 mg total) by mouth daily. What changed:  medication strength how much to take   tiZANidine 4 MG tablet Commonly known as: ZANAFLEX Take 4 mg by mouth 3 (three) times daily as needed for muscle spasms.   traZODone 50 MG tablet Commonly known as: DESYREL Take 50 mg by mouth at bedtime.   Trulicity 1.5 DQ/2.2WL Sopn Generic drug:  Dulaglutide Inject 1.5 mg into the skin every Thursday.   Vitamin D3 125 MCG (5000 UT) Caps Take 5,000 Units by mouth daily.        Follow-up Information     Murlean Iba, MD Follow up on 01/12/2021.   Specialty: Nephrology Why: or Dr. Holley Raring..@ 10:40am Contact information: [place] [address] [phone]         Denton Lank, MD Follow up in 1 week(s).   Specialty: Family Medicine Why: The doctor's office will call you with a follow up appointment. Contact information: [address] [place] [address] [phone]         Minna Merritts, MD Follow up on 01/08/2021.   Specialty: Cardiology Why: @ 8:30am Contact information: [place] [address] [phone]         [facility] Follow up on 12/30/2020.   Specialty: Cardiology Why: @ 3:30pm Contact information: [place]        Ottie Glazier, MD Follow up on 01/08/2021.   Specialty: Pulmonary Disease Why: @ 11:15am Contact information: [address] [place] [address] [phone]                Allergies  Allergen Reactions   Other Rash    Pt reports allergy to metals. Patient also reports allergic to "ice" and it makes her skin swell where it touches.  She states she can drink water with no issues.    Consultations: Nephrology, cardiology, PCCM   Procedures/Studies: DG Chest 2 View  Result Date: 12/24/2020 CLINICAL DATA:  Chest pain EXAM: CHEST - 2 VIEW COMPARISON:  Chest x-ray 11/27/2020, CT chest 11/27/2020 FINDINGS: The heart and mediastinal contours are unchanged with persistent small pericardial effusion. Aortic calcification. Bilateral lower lobe streaky airspace opacities. No pulmonary edema. Likely trace bilateral pleural effusions. No pneumothorax. No acute osseous abnormality. IMPRESSION: 1. Persistent likely small volume pericardial effusion. 2. Trace bilateral pleural effusions not excluded. 3. Bilateral lower lobe streaky airspace opacities. Findings may represent a combination of infection/invasion versus atelectasis. Electronically Signed   By: Iven Finn M.D.   On: 12/24/2020 17:25   CARDIAC CATHETERIZATION  Result Date: 12/25/2020   Ost LM to Mid LM lesion is 40% stenosed.   Dist LAD lesion is 80% stenosed.   Ost Cx to Prox Cx lesion is 30% stenosed.   Ramus lesion is 40% stenosed.   1st Diag lesion is 100% stenosed.   Ost LAD to Prox LAD lesion is 80% stenosed.   Prox LAD lesion is 60% stenosed.   A drug-eluting stent was successfully placed using a STENT ONYX FRONTIER [id number].   Post intervention, there is a 0% residual stenosis.   Post intervention, there is a 0% residual stenosis. 1.  Significant ostial and proximal LAD disease.  This was significant by IVUS with a minimal luminal area of 3.5 cm.  Mild ramus and left circumflex disease.  The left circumflex is dominant. 2.  Moderately elevated left ventricular end-diastolic pressure at 22 mmHg. 3.  Successful angioplasty and drug-eluting stent  placement to the proximal/ostial LAD with 1 stent. Recommendations: Dual antiplatelet therapy for at least 12 months. Aggressive treatment of risk factors.  Gentle hydration for 6 to 8 hours to prevent contrast-induced nephropathy given significant underlying chronic kidney disease.  The procedure was overall difficult and required 120 mL of contrast.  ECHOCARDIOGRAM COMPLETE  Result Date: 12/26/2020    ECHOCARDIOGRAM REPORT   Patient Name:   MARYN FREELOVE Date of Exam: 12/26/2020 Medical Rec #:  [id number]         Height:       65.0 in Accession #:    [id number]        Weight:       195.8 lb Date of Birth:  Sep 03, 1940        BSA:          1.960 m Patient Age:    81 years          BP:           81/53 mmHg Patient Gender: F                 HR:           101 bpm. Exam Location:  [facility] Procedure: 2D Echo, Color Doppler, Cardiac Doppler and Intracardiac            Opacification Agent Indications:     R07.9 Chest Pain  History:         Patient has prior history of Echocardiogram examinations, most                  recent 09/16/2020. CHF, CAD, CKD and COPD; Risk                  Factors:Hypertension, Dyslipidemia, Diabetes and Former Smoker.  Sonographer:     Charmayne Sheer Referring Phys:  [place] Diagnosing Phys: Ida Rogue MD  Sonographer Comments: Technically difficult study due to poor echo windows. Image acquisition challenging due to COPD. IMPRESSIONS  1. Challenging images  2. Left ventricular ejection fraction, by estimation, is 30 to 35%. The left ventricle has moderately decreased function. The left ventricle demonstrates global hypokinesis. The left ventricular internal cavity size was mildly dilated. There is mild left ventricular hypertrophy. Left ventricular diastolic parameters are indeterminate.  3. Right ventricular systolic function is normal. The right ventricular size is normal. Tricuspid regurgitation signal is inadequate for assessing PA pressure.  4. A small pericardial  effusion is present. FINDINGS  Left Ventricle: Left ventricular ejection fraction, by estimation, is 30 to 35%. The left ventricle has moderately decreased function. The left ventricle demonstrates global hypokinesis. Definity contrast agent was given IV to delineate the left ventricular endocardial borders. The left ventricular internal cavity size was mildly dilated. There is mild left ventricular hypertrophy. Left ventricular diastolic parameters are indeterminate. Right Ventricle: The right ventricular size is normal. No increase in right ventricular wall thickness. Right ventricular systolic function is normal. Tricuspid regurgitation signal is inadequate for assessing PA pressure. Left Atrium: Left atrial size was normal in size. Right Atrium: Right atrial size was normal in size. Pericardium: A small pericardial effusion is present. Mitral Valve: The mitral valve is normal in structure. Moderate mitral annular calcification. No evidence of mitral valve regurgitation. No evidence of mitral valve stenosis. MV peak gradient, 11.0 mmHg. The mean mitral valve gradient is 4.0 mmHg. Tricuspid Valve: The tricuspid valve is normal in structure. Tricuspid valve regurgitation is not demonstrated. No evidence of tricuspid stenosis. Aortic Valve: The aortic valve was not well visualized. Aortic valve regurgitation is not visualized. No aortic stenosis is present. Aortic valve mean gradient measures 6.0 mmHg. Aortic valve peak gradient measures 9.4 mmHg. Aortic valve area, by VTI measures 2.84 cm. Pulmonic Valve: The pulmonic valve was normal in structure. Pulmonic valve  regurgitation is not visualized. No evidence of pulmonic stenosis. Aorta: The aortic root is normal in size and structure. Venous: The pulmonary veins were not well visualized. The inferior vena cava was not well visualized. The inferior vena cava is normal in size with greater than 50% respiratory variability, suggesting right atrial pressure of 3 mmHg.  IAS/Shunts: No atrial level shunt detected by color flow Doppler.  LEFT VENTRICLE PLAX 2D LVIDd:         4.40 cm     Diastology LVIDs:         3.90 cm     LV e' medial:    11.50 cm/s LV PW:         1.50 cm     LV E/e' medial:  13.4 LV IVS:        1.10 cm     LV e' lateral:   11.30 cm/s LVOT diam:     2.20 cm     LV E/e' lateral: 13.6 LV SV:         85 LV SV Index:   43 LVOT Area:     3.80 cm  LV Volumes (MOD) LV vol d, MOD A2C: 90.3 ml LV vol d, MOD A4C: 95.9 ml LV vol s, MOD A2C: 55.2 ml LV vol s, MOD A4C: 60.4 ml LV SV MOD A2C:     35.1 ml LV SV MOD A4C:     95.9 ml LV SV MOD BP:      39.0 ml LEFT ATRIUM             Index LA diam:        4.40 cm 2.24 cm/m LA Vol (A2C):   28.0 ml 14.28 ml/m LA Vol (A4C):   41.3 ml 21.07 ml/m LA Biplane Vol: 33.9 ml 17.29 ml/m  AORTIC VALVE                     PULMONIC VALVE AV Area (Vmax):    3.30 cm      PV Vmax:       0.90 m/s AV Area (Vmean):   2.99 cm      PV Vmean:      65.600 cm/s AV Area (VTI):     2.84 cm      PV VTI:        0.145 m AV Vmax:           153.00 cm/s   PV Peak grad:  3.2 mmHg AV Vmean:          114.000 cm/s  PV Mean grad:  2.0 mmHg AV VTI:            0.298 m AV Peak Grad:      9.4 mmHg AV Mean Grad:      6.0 mmHg LVOT Vmax:         133.00 cm/s LVOT Vmean:        89.700 cm/s LVOT VTI:          0.223 m LVOT/AV VTI ratio: 0.75  AORTA Ao Root diam: 2.70 cm MITRAL VALVE MV Area (PHT): 5.97 cm     SHUNTS MV Area VTI:   2.97 cm     Systemic VTI:  0.22 m MV Peak grad:  11.0 mmHg    Systemic Diam: 2.20 cm MV Mean grad:  4.0 mmHg MV Vmax:       1.66 m/s MV Vmean:      95.2 cm/s MV Decel Time: 127 msec  MV E velocity: 153.67 cm/s Ida Rogue MD Electronically signed by Ida Rogue MD Signature Date/Time: 12/26/2020/2:47:43 PM    Final       Subjective:   Discharge Exam: Vitals:   12/29/20 0846 12/29/20 1111  BP: (!) 156/77 123/78  Pulse: 93 93  Resp: 20 20  Temp: 97.9 F (36.6 C) 97.6 F (36.4 C)  SpO2: 100% 97%   Vitals:   12/29/20 0500  12/29/20 0824 12/29/20 0846 12/29/20 1111  BP:   (!) 156/77 123/78  Pulse:  92 93 93  Resp:  16 20 20   Temp:   97.9 F (36.6 C) 97.6 F (36.4 C)  TempSrc:      SpO2:  99% 100% 97%  Weight: 88.5 kg     Height:        General: Pt is alert, awake, not in acute distress Cardiovascular: RRR, S1/S2 +, no rubs, no gallops Respiratory: CTA bilaterally, no wheezing, no rhonchi Abdominal: Soft, NT, ND, bowel sounds + Extremities: no edema    The results of significant diagnostics from this hospitalization (including imaging, microbiology, ancillary and laboratory) are listed below for reference.     Microbiology: Recent Results (from the past 240 hour(s))  Resp Panel by RT-PCR (Flu A&B, Covid) Nasopharyngeal Swab     Status: None   Collection Time: 12/24/20  8:02 PM   Specimen: Nasopharyngeal Swab; Nasopharyngeal(NP) swabs in vial transport medium  Result Value Ref Range Status   SARS Coronavirus 2 by RT PCR NEGATIVE NEGATIVE Final    Comment: (NOTE) SARS-CoV-2 target nucleic acids are NOT DETECTED.  The SARS-CoV-2 RNA is generally detectable in upper respiratory specimens during the acute phase of infection. The lowest concentration of SARS-CoV-2 viral copies this assay can detect is 138 copies/mL. A negative result does not preclude SARS-Cov-2 infection and should not be used as the sole basis for treatment or other patient management decisions. A negative result may occur with  improper specimen collection/handling, submission of specimen other than nasopharyngeal swab, presence of viral mutation(s) within the areas targeted by this assay, and inadequate number of viral copies(<138 copies/mL). A negative result must be combined with clinical observations, patient history, and epidemiological information. The expected result is Negative.  Fact Sheet for Patients:  EntrepreneurPulse.com.au  Fact Sheet for Healthcare Providers:   IncredibleEmployment.be  This test is no t yet approved or cleared by the [place] FDA and  has been authorized for detection and/or diagnosis of SARS-CoV-2 by FDA under an Emergency Use Authorization (EUA). This EUA will remain  in effect (meaning this test can be used) for the duration of the COVID-19 declaration under Section 564(b)(1) of the Act, 21 U.S.C.section 360bbb-3(b)(1), unless the authorization is terminated  or revoked sooner.       Influenza A by PCR NEGATIVE NEGATIVE Final   Influenza B by PCR NEGATIVE NEGATIVE Final    Comment: (NOTE) The Xpert Xpress SARS-CoV-2/FLU/RSV plus assay is intended as an aid in the diagnosis of influenza from Nasopharyngeal swab specimens and should not be used as a sole basis for treatment. Nasal washings and aspirates are unacceptable for Xpert Xpress SARS-CoV-2/FLU/RSV testing.  Fact Sheet for Patients: EntrepreneurPulse.com.au  Fact Sheet for Healthcare Providers: IncredibleEmployment.be  This test is not yet approved or cleared by the [place] FDA and has been authorized for detection and/or diagnosis of SARS-CoV-2 by FDA under an Emergency Use Authorization (EUA). This EUA will remain in effect (meaning this test can be used) for the duration of the  COVID-19 declaration under Section 564(b)(1) of the Act, 21 U.S.C. section 360bbb-3(b)(1), unless the authorization is terminated or revoked.  Performed at [facility], [address] [place], [place] [address]      Labs: BNP (last 3 results) Recent Labs    09/16/20 1447 11/27/20 0831 12/24/20 1519  BNP 978.5* 1,226.5* 350.0*   Basic Metabolic Panel: Recent Labs  Lab 12/24/20 1519 12/26/20 0419 12/27/20 0639 12/28/20 0633 12/29/20 0415  NA 140 136 139 139 139  K 4.1 4.8 5.3* 5.1 5.1  CL 103 103 104 103 106  CO2 27 26 26 28 27   GLUCOSE 181* 141* 137* 143* 155*  BUN 27* 33* 38* 38* 37*   CREATININE 2.12* 2.41* 2.83* 2.65* 2.29*  CALCIUM 10.8* 9.4 9.2 8.6* 8.9   Liver Function Tests: No results for input(s): AST, ALT, ALKPHOS, BILITOT, PROT, ALBUMIN in the last 168 hours. No results for input(s): LIPASE, AMYLASE in the last 168 hours. No results for input(s): AMMONIA in the last 168 hours. CBC: Recent Labs  Lab 12/24/20 1519 12/26/20 0419  WBC 8.5 9.3  HGB 10.2* 9.5*  HCT 32.7* 31.3*  MCV 89.3 91.8  PLT 361 330   Cardiac Enzymes: No results for input(s): CKTOTAL, CKMB, CKMBINDEX, TROPONINI in the last 168 hours. BNP: Invalid input(s): POCBNP CBG: Recent Labs  Lab 12/28/20 1545 12/28/20 2015 12/28/20 2122 12/29/20 0849 12/29/20 1244  GLUCAP 148* 169* 163* 152* 249*   D-Dimer No results for input(s): DDIMER in the last 72 hours. Hgb A1c No results for input(s): HGBA1C in the last 72 hours. Lipid Profile No results for input(s): CHOL, HDL, LDLCALC, TRIG, CHOLHDL, LDLDIRECT in the last 72 hours. Thyroid function studies No results for input(s): TSH, T4TOTAL, T3FREE, THYROIDAB in the last 72 hours.  Invalid input(s): FREET3 Anemia work up No results for input(s): VITAMINB12, FOLATE, FERRITIN, TIBC, IRON, RETICCTPCT in the last 72 hours. Urinalysis    Component Value Date/Time   COLORURINE Amber 10/21/2012 1145   APPEARANCEUR Cloudy 10/21/2012 1145   LABSPEC 1.015 10/21/2012 1145   PHURINE 6.0 10/21/2012 1145   GLUCOSEU Negative 10/21/2012 1145   HGBUR 1+ 10/21/2012 1145   BILIRUBINUR Negative 10/21/2012 1145   KETONESUR Negative 10/21/2012 1145   PROTEINUR >=500 10/21/2012 1145   NITRITE Positive 10/21/2012 1145   LEUKOCYTESUR 3+ 10/21/2012 1145   Sepsis Labs Invalid input(s): PROCALCITONIN,  WBC,  LACTICIDVEN Microbiology Recent Results (from the past 240 hour(s))  Resp Panel by RT-PCR (Flu A&B, Covid) Nasopharyngeal Swab     Status: None   Collection Time: 12/24/20  8:02 PM   Specimen: Nasopharyngeal Swab; Nasopharyngeal(NP) swabs in  vial transport medium  Result Value Ref Range Status   SARS Coronavirus 2 by RT PCR NEGATIVE NEGATIVE Final    Comment: (NOTE) SARS-CoV-2 target nucleic acids are NOT DETECTED.  The SARS-CoV-2 RNA is generally detectable in upper respiratory specimens during the acute phase of infection. The lowest concentration of SARS-CoV-2 viral copies this assay can detect is 138 copies/mL. A negative result does not preclude SARS-Cov-2 infection and should not be used as the sole basis for treatment or other patient management decisions. A negative result may occur with  improper specimen collection/handling, submission of specimen other than nasopharyngeal swab, presence of viral mutation(s) within the areas targeted by this assay, and inadequate number of viral copies(<138 copies/mL). A negative result must be combined with clinical observations, patient history, and epidemiological information. The expected result is Negative.  Fact Sheet for Patients:  EntrepreneurPulse.com.au  Fact Sheet for Healthcare Providers:  IncredibleEmployment.be  This test is no t yet approved or cleared by the [place] FDA and  has been authorized for detection and/or diagnosis of SARS-CoV-2 by FDA under an Emergency Use Authorization (EUA). This EUA will remain  in effect (meaning this test can be used) for the duration of the COVID-19 declaration under Section 564(b)(1) of the Act, 21 U.S.C.section 360bbb-3(b)(1), unless the authorization is terminated  or revoked sooner.       Influenza A by PCR NEGATIVE NEGATIVE Final   Influenza B by PCR NEGATIVE NEGATIVE Final    Comment: (NOTE) The Xpert Xpress SARS-CoV-2/FLU/RSV plus assay is intended as an aid in the diagnosis of influenza from Nasopharyngeal swab specimens and should not be used as a sole basis for treatment. Nasal washings and aspirates are unacceptable for Xpert Xpress SARS-CoV-2/FLU/RSV testing.  Fact  Sheet for Patients: EntrepreneurPulse.com.au  Fact Sheet for Healthcare Providers: IncredibleEmployment.be  This test is not yet approved or cleared by the [place] FDA and has been authorized for detection and/or diagnosis of SARS-CoV-2 by FDA under an Emergency Use Authorization (EUA). This EUA will remain in effect (meaning this test can be used) for the duration of the COVID-19 declaration under Section 564(b)(1) of the Act, 21 U.S.C. section 360bbb-3(b)(1), unless the authorization is terminated or revoked.  Performed at [facility], [address] [place], [place] [address]      Time coordinating discharge: Over 30 minutes  SIGNED:   Nolberto Hanlon, MD  [facility] 12/30/2020, 5:24 PM Pager   If 7PM-7AM, please contact night-coverage [email or web address] Password [email or web address]

## 2020-12-29 NOTE — Progress Notes (Signed)
Pt complained about SOB and requested her inhaler around 1:15 AM, called out again saying she needed her inhaler because it's been four hours. Informed patient that is was after 3 and it was not time for the inhaler but would call the MD to see if she can get a prn neb treatment because her scheduled neb treatment is BID and not due. MD ordered a one time albuterol neb and it was administered. Patient verbalized feeling better. Does have COPD and abdominal retractions with breathing. RR now 24, will continue to monitor.   Thadd Apuzzo DNP [phone] AM

## 2020-12-29 NOTE — Care Management Important Message (Signed)
Important Message  Patient Details  Name: Mckenzie Taylor MRN: [id number] Date of Birth: 04-Oct-1940   Medicare Important Message Given:  Yes     Dannette Barbara 12/29/2020, 3:01 PM

## 2020-12-29 NOTE — Progress Notes (Signed)
Discharge instructions explained to pt and pts spouse/ verbalized an understanding/ iv and tele removed/ will transport off unit via wheelchair.

## 2020-12-29 NOTE — Progress Notes (Signed)
Pulmonary Medicine          Date: 12/29/2020,   MRN# [id number] Mckenzie Taylor 11-03-1940     AdmissionWeight: 36 kg                 CurrentWeight: 88.5 kg   Referring physician: Dr. 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  She was acutely hypoxemic and requiring increased oxygen via nasal cannula which relieved her respiratory distress while in the ER.  She was evaluated by cardiology team and found worsening ischemic cardiomyopathy with most recent ejection fraction on transthoracic echo at 25% and there is additional work-up with cardiac cath in process.  She had CT chest done which was November 27, 2020-I reviewed this independently with findings of mild bronchitic changes bilaterally and atelectatic segments of the right middle lobe as well as lingular atelectasis other than that few clinically insignificant nodules.  PCCM consultation for further evaluation management of recurrent COPD exacerbation.   12/29/20- patient cleared for dc home. Reviewed with husband and patient at bedside.   PAST MEDICAL HISTORY   Past Medical History:  Diagnosis Date  . Anemia of chronic disease    Baseline hgb 8.0-8.9  . Autosomal recessive polycystic kidneys   . Avascular necrosis of hip (HCC)    bilateral  . CAD (coronary artery disease)  2004   2004 LHC with mild and nonobstructive CAD: 20% mid LAD stenosis  . Chronic combined systolic and diastolic heart failure (HCC)    HFrEF, EF 45% last echo, LVH, diastolic dysfunction 1062  . Chronic kidney disease    PCKD, CKD, adrenal adenoma, cyst  . COPD (chronic obstructive pulmonary disease) ([place])    3L home oxygen  . Diabetes type 2, controlled ([place])   . Former heavy tobacco smoker    1 pk / day. Estimates quit ~ 2015  . GERD (gastroesophageal reflux disease)   . HTN (hypertension)   . Hyperlipidemia with target LDL less than 70   . Hypothyroidism    subclinical. low TSH, normal thyroid panel. biopsy 2010  . Vitamin B12 deficiency      SURGICAL HISTORY   Past Surgical History:  Procedure Laterality Date  . CHOLECYSTECTOMY    . CORONARY STENT INTERVENTION N/A 12/25/2020   Procedure: CORONARY STENT INTERVENTION;  Surgeon: Wellington Hampshire, MD;  Location: [facility];  Service: Cardiovascular;  Laterality: N/A;  . hip replacement     bilateral-secondary to avascular necrosis  . LEFT HEART CATH AND CORONARY ANGIOGRAPHY N/A 10/17/2020   Procedure: LEFT HEART CATH AND CORONARY ANGIOGRAPHY;  Surgeon: Corey Skains, MD;  Location: [facility];  Service: Cardiovascular;  Laterality: N/A;  . LEFT HEART CATH AND CORONARY ANGIOGRAPHY N/A 12/25/2020   Procedure: LEFT HEART CATH AND CORONARY ANGIOGRAPHY;  Surgeon: Wellington Hampshire, MD;  Location: [facility];  Service: Cardiovascular;  Laterality: N/A;  . right eye lens replacement    . ULNAR NERVE REPAIR     bilateral  . VESICOVAGINAL FISTULA CLOSURE W/ TAH       FAMILY HISTORY   Family History  Problem Relation Age of Onset  . Cancer Father        lung  . COPD Mother   . Heart disease Mother   . Heart disease Maternal Uncle      SOCIAL HISTORY   Social History   Tobacco Use  . Smoking status: Former    Packs/day: 1.00    Years: 50.00    Pack years: 50.00    Types: Cigarettes   . Smokeless tobacco: Never  . Tobacco comments:    1 ppd - 50 years   Substance Use Topics  . Alcohol use: No  . Drug use: No     MEDICATIONS    Home Medication:    Current Medication:  Current Facility-Administered Medications:  .  0.9 %  sodium chloride infusion, 250 mL, Intravenous, PRN, Fletcher Anon, Muhammad A, MD .  acetaminophen (TYLENOL) tablet 650 mg, 650 mg, Oral, Q4H PRN, Arida, Muhammad A, MD .  albuterol (VENTOLIN HFA) 108 (90 Base) MCG/ACT inhaler 1 puff, 1 puff, Inhalation, Q4H PRN, Athena Masse, MD, 1 puff at 12/29/20 0518 .  aspirin EC tablet 81 mg, 81 mg, Oral, Daily, Kathlyn Sacramento A, MD, 81 mg at 12/29/20 0924 .  budesonide (PULMICORT) nebulizer solution 0.25 mg, 0.25 mg, Nebulization, BID, Arida, Muhammad A, MD, 0.25 mg at 12/29/20 0824 .  calcium carbonate (TUMS - dosed in mg elemental calcium) chewable tablet 200 mg of elemental calcium, 1 tablet, Oral, QID PRN, Nolberto Hanlon, MD, 200 mg of elemental calcium at 12/28/20 1311 .  carvedilol (COREG) tablet 6.25 mg, 6.25 mg, Oral, BID WC, Arida, Muhammad A, MD, 6.25 mg at 12/29/20 0924 .  clopidogrel (PLAVIX) tablet 75 mg, 75 mg, Oral, Daily, Kathlyn Sacramento A, MD, 75 mg at 12/29/20 0924 .  famotidine (PEPCID) tablet 20 mg, 20 mg, Oral, Daily, Amery, Sahar, MD, 20 mg at 12/29/20 0924 .  heparin injection 5,000 Units, 5,000 Units, Subcutaneous, Q8H, Wellington Hampshire, MD, 5,000 Units at 12/29/20 0518 .  insulin aspart (novoLOG) injection 0-9 Units, 0-9 Units, Subcutaneous, TID WC, Wellington Hampshire, MD, 2 Units at 12/29/20 [phone]  ipratropium-albuterol (DUONEB) 0.5-2.5 (3) MG/3ML nebulizer solution 3 mL, 3 mL, Nebulization, BID, Amery, Sahar, MD .  isosorbide mononitrate (IMDUR) 24 hr tablet 30 mg, 30 mg, Oral, Daily, Fletcher Anon, Muhammad A, MD, 30 mg at 12/29/20 0924 .  melatonin tablet 5 mg, 5 mg, Oral, QHS, Sharion Settler, NP, 5 mg at 12/28/20 2200 .  nitroGLYCERIN (NITROSTAT) SL tablet 0.4 mg, 0.4 mg, Sublingual, Q5  min PRN, Kathlyn Sacramento A, MD, 0.4 mg at 12/28/20 0327 .  ondansetron (ZOFRAN) injection 4 mg, 4 mg, Intravenous, Q6H PRN, Wellington Hampshire, MD, 4 mg at 12/26/20 0238 .  predniSONE (DELTASONE) tablet 20 mg, 20 mg, Oral, Q breakfast, Mysty Kielty, MD, 20 mg at 12/29/20 0924 .  rosuvastatin (CRESTOR) tablet 40 mg, 40 mg, Oral, Daily, Furth, Cadence H, PA-C, 40 mg at 12/29/20 0924 .  sodium chloride flush (NS) 0.9 % injection 3 mL, 3 mL, Intravenous, Q12H, Arida, Muhammad A, MD, 3 mL at 12/29/20 0925 .  sodium chloride flush (NS) 0.9 % injection 3 mL, 3 mL, Intravenous, PRN, Wellington Hampshire, MD .  traZODone (DESYREL) tablet  50 mg, 50 mg, Oral, QHS, Arida, Muhammad A, MD, 50 mg at 12/28/20 2200 .  umeclidinium-vilanterol (ANORO ELLIPTA) 62.5-25 MCG/INH 1 puff, 1 puff, Inhalation, Daily, Wellington Hampshire, MD, 1 puff at 12/29/20 8099    ALLERGIES   Other     REVIEW OF SYSTEMS    Review of Systems:  Gen:  Denies  fever, sweats, chills weigh loss  HEENT: Denies blurred vision, double vision, ear pain, eye pain, hearing loss, nose bleeds, sore throat Cardiac:  No dizziness, chest pain or heaviness, chest tightness,edema Resp:   Denies cough or sputum porduction, shortness of breath,wheezing, hemoptysis,  Gi: Denies swallowing difficulty, stomach pain, nausea or vomiting, diarrhea, constipation, bowel incontinence Gu:  Denies bladder incontinence, burning urine Ext:   Denies Joint pain, stiffness or swelling Skin: Denies  skin rash, easy bruising or bleeding or hives Endoc:  Denies polyuria, polydipsia , polyphagia or weight change Psych:   Denies depression, insomnia or hallucinations   Other:  All other systems negative   VS: BP 123/78 (BP Location: Left Arm)   Pulse 93   Temp 97.6 F (36.4 C)   Resp 20   Ht 5\' 5"  (1.651 m)   Wt 88.5 kg   SpO2 97%   BMI 32.47 kg/m      PHYSICAL EXAM    GENERAL:NAD, no fevers, chills, no weakness no fatigue HEAD: Normocephalic,  atraumatic.  EYES: Pupils equal, round, reactive to light. Extraocular muscles intact. No scleral icterus.  MOUTH: Moist mucosal membrane. Dentition intact. No abscess noted.  EAR, NOSE, THROAT: Clear without exudates. No external lesions.  NECK: Supple. No thyromegaly. No nodules. No JVD.  PULMONARY: decreased breath sounds bilaterally CARDIOVASCULAR: S1 and S2. Regular rate and rhythm. No murmurs, rubs, or gallops. No edema. Pedal pulses 2+ bilaterally.  GASTROINTESTINAL: Soft, nontender, nondistended. No masses. Positive bowel sounds. No hepatosplenomegaly.  MUSCULOSKELETAL: No swelling, clubbing, or edema. Range of motion full in all extremities.  NEUROLOGIC: Cranial nerves II through XII are intact. No gross focal neurological deficits. Sensation intact. Reflexes intact.  SKIN: No ulceration, lesions, rashes, or cyanosis. Skin warm and dry. Turgor intact.  PSYCHIATRIC: Mood, affect within normal limits. The patient is awake, alert and oriented x 3. Insight, judgment intact.       IMAGING    DG Chest 2 View  Result Date: 12/24/2020 CLINICAL DATA:  Chest pain EXAM: CHEST - 2 VIEW COMPARISON:  Chest x-ray 11/27/2020, CT chest 11/27/2020 FINDINGS: The heart and mediastinal contours are unchanged with persistent small pericardial effusion. Aortic calcification. Bilateral lower lobe streaky airspace opacities. No pulmonary edema. Likely trace bilateral pleural effusions. No pneumothorax. No acute osseous abnormality. IMPRESSION: 1. Persistent likely small volume pericardial effusion. 2. Trace bilateral pleural effusions not excluded. 3. Bilateral lower lobe streaky airspace opacities. Findings may represent a combination of infection/invasion versus atelectasis. Electronically Signed   By: Iven Finn M.D.   On: 12/24/2020 17:25   CARDIAC CATHETERIZATION  Result Date: 12/25/2020 .  Ost LM to Mid LM lesion is 40% stenosed. Jorene Minors LAD lesion is 80% stenosed. Colon Flattery Cx to Prox Cx lesion  is 30% stenosed. .  Ramus lesion is 40% stenosed. .  1st Diag lesion is 100% stenosed. Colon Flattery LAD to Prox LAD lesion is 80% stenosed. .  Prox LAD lesion is 60% stenosed. .  A drug-eluting stent was successfully placed using a STENT ONYX FRONTIER 3.0X26. Marland Kitchen  Post intervention, there is a 0% residual stenosis. Marland Kitchen  Post intervention, there is a 0% residual stenosis. 1.  Significant ostial and proximal LAD disease.  This was significant by IVUS with a minimal luminal area of 3.5 cm.  Mild ramus and left circumflex disease.  The left circumflex is dominant. 2.  Moderately elevated left ventricular end-diastolic pressure at 22 mmHg. 3.  Successful angioplasty and drug-eluting stent placement to the proximal/ostial LAD with 1 stent. Recommendations: Dual antiplatelet therapy for at least 12 months. Aggressive treatment of risk factors.  Gentle hydration for 6 to 8 hours to prevent contrast-induced nephropathy given significant underlying chronic kidney disease.  The procedure was overall difficult and required 120 mL of contrast.   ECHOCARDIOGRAM COMPLETE  Result Date: 12/26/2020    ECHOCARDIOGRAM REPORT   Patient Name:   Mckenzie Taylor Date of Exam: 12/26/2020 Medical Rec #:  [id number]         Height:       65.0 in Accession #:    [id number]        Weight:       195.8 lb Date of Birth:  1940-08-17        BSA:          1.960 m Patient Age:    81 years          BP:           81/53 mmHg Patient Gender: F                 HR:           101 bpm. Exam Location:  [facility] Procedure: 2D Echo, Color Doppler, Cardiac Doppler and Intracardiac            Opacification Agent Indications:     R07.9 Chest Pain  History:         Patient has prior history of Echocardiogram examinations, most                  recent 09/16/2020. CHF, CAD, CKD and COPD; Risk                  Factors:Hypertension, Dyslipidemia, Diabetes and Former Smoker.  Sonographer:     Charmayne Sheer Referring Phys:  [place] Diagnosing Phys: Ida Rogue MD   Sonographer Comments: Technically difficult study due to poor echo windows. Image acquisition challenging due to COPD. IMPRESSIONS  1. Challenging images  2. Left ventricular ejection fraction, by estimation, is 30 to 35%. The left ventricle has moderately decreased function. The left ventricle demonstrates global hypokinesis. The left ventricular internal cavity size was mildly dilated. There is mild left ventricular hypertrophy. Left ventricular diastolic parameters are indeterminate.  3. Right ventricular systolic function is normal. The right ventricular size is normal. Tricuspid regurgitation signal is inadequate for assessing PA pressure.  4. A small pericardial effusion is present. FINDINGS  Left Ventricle: Left ventricular ejection fraction, by estimation, is 30 to 35%. The left ventricle has moderately decreased function. The left ventricle demonstrates global hypokinesis. Definity contrast agent was given IV to delineate the left ventricular endocardial borders. The left ventricular internal cavity size was mildly dilated. There is mild left ventricular hypertrophy. Left ventricular diastolic parameters are indeterminate. Right Ventricle: The right ventricular size is normal. No increase in right ventricular wall thickness. Right ventricular systolic function is normal. Tricuspid regurgitation signal is inadequate for assessing PA pressure. Left Atrium: Left atrial size was normal in size. Right Atrium: Right atrial size was normal in size. Pericardium: A small  pericardial effusion is present. Mitral Valve: The mitral valve is normal in structure. Moderate mitral annular calcification. No evidence of mitral valve regurgitation. No evidence of mitral valve stenosis. MV peak gradient, 11.0 mmHg. The mean mitral valve gradient is 4.0 mmHg. Tricuspid Valve: The tricuspid valve is normal in structure. Tricuspid valve regurgitation is not demonstrated. No evidence of tricuspid stenosis. Aortic Valve: The aortic  valve was not well visualized. Aortic valve regurgitation is not visualized. No aortic stenosis is present. Aortic valve mean gradient measures 6.0 mmHg. Aortic valve peak gradient measures 9.4 mmHg. Aortic valve area, by VTI measures 2.84 cm. Pulmonic Valve: The pulmonic valve was normal in structure. Pulmonic valve regurgitation is not visualized. No evidence of pulmonic stenosis. Aorta: The aortic root is normal in size and structure. Venous: The pulmonary veins were not well visualized. The inferior vena cava was not well visualized. The inferior vena cava is normal in size with greater than 50% respiratory variability, suggesting right atrial pressure of 3 mmHg. IAS/Shunts: No atrial level shunt detected by color flow Doppler.  LEFT VENTRICLE PLAX 2D LVIDd:         4.40 cm     Diastology LVIDs:         3.90 cm     LV e' medial:    11.50 cm/s LV PW:         1.50 cm     LV E/e' medial:  13.4 LV IVS:        1.10 cm     LV e' lateral:   11.30 cm/s LVOT diam:     2.20 cm     LV E/e' lateral: 13.6 LV SV:         85 LV SV Index:   43 LVOT Area:     3.80 cm  LV Volumes (MOD) LV vol d, MOD A2C: 90.3 ml LV vol d, MOD A4C: 95.9 ml LV vol s, MOD A2C: 55.2 ml LV vol s, MOD A4C: 60.4 ml LV SV MOD A2C:     35.1 ml LV SV MOD A4C:     95.9 ml LV SV MOD BP:      39.0 ml LEFT ATRIUM             Index LA diam:        4.40 cm 2.24 cm/m LA Vol (A2C):   28.0 ml 14.28 ml/m LA Vol (A4C):   41.3 ml 21.07 ml/m LA Biplane Vol: 33.9 ml 17.29 ml/m  AORTIC VALVE                     PULMONIC VALVE AV Area (Vmax):    3.30 cm      PV Vmax:       0.90 m/s AV Area (Vmean):   2.99 cm      PV Vmean:      65.600 cm/s AV Area (VTI):     2.84 cm      PV VTI:        0.145 m AV Vmax:           153.00 cm/s   PV Peak grad:  3.2 mmHg AV Vmean:          114.000 cm/s  PV Mean grad:  2.0 mmHg AV VTI:            0.298 m AV Peak Grad:      9.4 mmHg AV Mean Grad:      6.0 mmHg LVOT Vmax:  133.00 cm/s LVOT Vmean:        89.700 cm/s LVOT VTI:           0.223 m LVOT/AV VTI ratio: 0.75  AORTA Ao Root diam: 2.70 cm MITRAL VALVE MV Area (PHT): 5.97 cm     SHUNTS MV Area VTI:   2.97 cm     Systemic VTI:  0.22 m MV Peak grad:  11.0 mmHg    Systemic Diam: 2.20 cm MV Mean grad:  4.0 mmHg MV Vmax:       1.66 m/s MV Vmean:      95.2 cm/s MV Decel Time: 127 msec MV E velocity: 153.67 cm/s Ida Rogue MD Electronically signed by Ida Rogue MD Signature Date/Time: 12/26/2020/2:47:43 PM    Final       ASSESSMENT/PLAN   Acute Mild exacerbation of COPD  -patient has bronchitic COPD with CT chest showing bronchitic changes  - will start prednisone 20mg  daily with current COPD care path  - PT/OT  CHF/CKD   - noted cardio and renal team input - appreciate collaboration   Multiple comorbid conditions - per [facility] primary team Hypothyroidism Avascular necrosis Anemia Diabetes mellitus Essential HTN    Thank you for allowing me to participate in the care of this patient.   Patient/Family are satisfied with care plan and all questions have been answered.  This document was prepared using Dragon voice recognition software and may include unintentional dictation errors.     Ottie Glazier, M.D.  [facility]

## 2020-12-30 ENCOUNTER — Ambulatory Visit: Payer: Medicare Other | Admitting: Family

## 2020-12-30 NOTE — Progress Notes (Deleted)
   Patient ID: Mckenzie Taylor, female    DOB: [birth year], 80 y.o.   MRN: [id number]  HPI  Mckenzie Taylor is a 80 y/o female with a history of  Echo report from 12/26/20 reviewed and showed an EF of 30-35% along with mild LVH.   LHC/ stent done 12/25/20 and showed: Ost LM to Mid LM lesion is 40% stenosed.   Dist LAD lesion is 80% stenosed.   Ost Cx to Prox Cx lesion is 30% stenosed.   Ramus lesion is 40% stenosed.   1st Diag lesion is 100% stenosed.   Ost LAD to Prox LAD lesion is 80% stenosed.   Prox LAD lesion is 60% stenosed.   A drug-eluting stent was successfully placed using a STENT ONYX FRONTIER 3.0X26.   Post intervention, there is a 0% residual stenosis.   Post intervention, there is a 0% residual stenosis.   1.  Significant ostial and proximal LAD disease.  This was significant by IVUS with a minimal luminal area of 3.5 cm.  Mild ramus and left circumflex disease.  The left circumflex is dominant. 2.  Moderately elevated left ventricular end-diastolic pressure at 22 mmHg. 3.  Successful angioplasty and drug-eluting stent placement to the proximal/ostial LAD with 1 stent.  Admitted 12/24/20 due to chest pain and shortness of breath. Initially diuresed but then stopped due to worsening kidney function thought to be due to contrast from cath. Cardiology and pulmonology consults obtained. Cath & stent done per above.  Discharged after 5 days. Had previous admission in September, August & July 2022.   She presents today for her initial visit with a chief complaint of   Review of Systems    Physical Exam    Assessment & Plan:  1: Chronic heart failure with reduced ejection fraction- - NYHA class - to see cardiology ([place]) 01/08/21  - BNP 12/24/20 was 944.1  2: HTN- - BP - saw PCP at [facility] 12/29/20 reviewed and showed sodium 139, potassium 5.1, creatinine 2.29 & GFR 21  3: DM with CKD- - to see nephrology Holley Raring) 01/12/21 - A1c  12/24/20 was 8.4%  4: COPD- - to see pulmonology Lanney Gins) 01/08/21

## 2020-12-31 ENCOUNTER — Telehealth: Payer: Self-pay | Admitting: Family

## 2020-12-31 NOTE — Telephone Encounter (Signed)
Patient did not show for her [facility] appointment on 12/30/20. Will attempt to reschedule.

## 2021-01-08 ENCOUNTER — Encounter: Payer: Self-pay | Admitting: Nurse Practitioner

## 2021-01-08 ENCOUNTER — Ambulatory Visit: Payer: Medicare Other | Admitting: Nurse Practitioner

## 2021-01-08 NOTE — Progress Notes (Deleted)
Office Visit    Patient Name: Mckenzie Taylor Date of Encounter: 01/08/2021  Primary Care Provider:  Denton Lank, MD Primary Cardiologist:  Ida Rogue, MD  Chief Complaint    80 year old female with a history of CAD, hypertension, hyperlipidemia, type 2 diabetes mellitus, ischemic cardiomyopathy, HFrEF, GERD, COPD on home O2, stage IV chronic kidney disease, and obesity, who presents today following recent non-STEMI and PCI of the LAD.  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 2004 LHC with mild nonobs dzs: 20% mid LAD stenosis; b. 10/2020 Cath: LM 40ost/m, LAD 55p, 66m, 80d, D1 100, RI 40, LCX 30ost, RCA 88m; c. 12/2020 PCI: LM 40ost/mid, LAD 80ost/p, 60p (3.0x26 Onyx Frontier DES), 80d, D1 100, RI 40, LCX 30ost/p.   Chronic combined systolic and diastolic heart failure ([place])    a. 10/2018 Echo: EF 50-55%; b. 10/2020 Echo: EF 25-30%; c. 12/2020 Echo: EF 30-35%, glob HK, mild LVH, nl RV fxn.   CKD (chronic kidney disease), stage IV (HCC)    PCKD, CKD, adrenal adenoma, cyst   COPD (chronic obstructive pulmonary disease) ([place])    3L home oxygen   Diabetes type 2, controlled ([place])    Former heavy tobacco smoker    1 pk / day. Estimates quit ~ 2015   GERD (gastroesophageal reflux disease)    HTN (hypertension)    Hyperlipidemia with target LDL less than 70    Hypothyroidism    subclinical. low TSH, normal thyroid panel. biopsy 2010   Ischemic cardiomyopathy    a. 10/2018 Echo: EF 50-55%; b. 10/2020 Echo: EF 25-30%; c. 12/2020 Echo: EF 30-35%, glob HK, mild LVH, nl RV fxn.   Vitamin B12 deficiency    Past Surgical History:  Procedure Laterality Date   CHOLECYSTECTOMY     CORONARY STENT INTERVENTION N/A 12/25/2020   Procedure: CORONARY STENT INTERVENTION;  Surgeon: Wellington Hampshire, MD;  Location: [place] [facility];  Service: Cardiovascular;  Laterality: N/A;   hip replacement     bilateral-secondary to avascular necrosis   LEFT HEART CATH AND CORONARY ANGIOGRAPHY N/A 10/17/2020   Procedure: LEFT HEART CATH AND CORONARY ANGIOGRAPHY;  Surgeon: Corey Skains, MD;  Location: [place] [facility];  Service: Cardiovascular;  Laterality: N/A;   LEFT HEART CATH AND CORONARY ANGIOGRAPHY N/A 12/25/2020   Procedure: LEFT HEART CATH AND CORONARY ANGIOGRAPHY;  Surgeon: Wellington Hampshire, MD;  Location: [place] [facility];  Service: Cardiovascular;  Laterality: N/A;   right eye lens replacement     ULNAR NERVE REPAIR     bilateral   VESICOVAGINAL FISTULA CLOSURE W/ TAH      Allergies  Allergies  Allergen Reactions   Other Rash    Pt reports allergy to metals. Patient also reports allergic to "ice" and it makes her skin swell where it touches.  She states she can drink water with no issues.    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  She previously underwent diagnostic catheterization 2004 at [facility], which showed nonobstructive disease.  More recently however,  she was admitted in August 2022 with unstable angina and underwent diagnostic catheterization performed by Dr. Nehemiah Massed revealing three-vessel disease including moderate to severe LAD disease.  She was medically managed.  Echo showed worsening LV function with an EF of 25 to 30%.  In September of this year, she was admitted with COVID-19, acute on chronic systolic heart failure, and respiratory failure.  Following discharge, she had worsening angina and represented on October 12.  Troponin was elevated at 57.  She was admitted and seen by our team this time.  Prior films were reviewed  and decision made to pursue repeat catheterization which showed 80% ostial/proximal LAD stenosis followed by a 6% proximal stenosis.  This was successfully treated with an Onyx frontier drug-eluting stent.  Otherwise, anatomy was stable.  Repeat echo again showed LV dysfunction with an EF of 30 to 35%.  Post catheterization, she had rising creatinine to peak of 2.83.  As this resolved, recommendation was made for our team for resumption of inpatient diuretic therapy however, she was instead discharged home on October 17 off of torsemide and metolazone.  Home Medications    Current Outpatient Medications  Medication Sig Dispense Refill   albuterol (VENTOLIN HFA) 108 (90 Base) MCG/ACT inhaler Inhale 2 puffs into the lungs every 4 (four) hours as needed for wheezing or shortness of breath.     aspirin EC 81 MG EC tablet Take 1 tablet (81 mg total) by mouth daily. 30 tablet 2   busPIRone (BUSPAR) 5 MG tablet Take 5 mg by mouth 2 (two) times daily.     carvedilol (COREG) 12.5 MG tablet Take 1 tablet (12.5 mg total) by mouth 2 (two) times daily with a meal. 60 tablet 0   Cholecalciferol (VITAMIN D3) 5000 units CAPS Take 5,000 Units by mouth daily. (Patient not taking: Reported on 12/24/2020)     clopidogrel (PLAVIX) 75 MG tablet Take 1 tablet (75 mg total) by mouth daily. 30 tablet 0   Cyanocobalamin 1500 MCG TBDP Take 4,500 mcg by mouth daily.     Dulaglutide (TRULICITY) 1.5 RW/4.3XV SOPN Inject 1.5 mg into the skin every Thursday.     famotidine (PEPCID) 20 MG tablet Take 1 tablet (20 mg total) by mouth daily. 30 tablet 0   Fluticasone-Umeclidin-Vilant 100-62.5-25 MCG/INH AEPB Inhale 1 puff into the lungs daily.     insulin detemir (LEVEMIR) 100 UNIT/ML FlexPen Inject 42 Units into the skin at bedtime.     insulin lispro (HUMALOG) 100 UNIT/ML KwikPen Inject 0-22 Units into the skin 3 (three) times daily with meals. Sliding scale     isosorbide mononitrate (IMDUR) 30 MG 24 hr tablet Take 1 tablet (30 mg  total) by mouth daily. 30 tablet 0   nitroGLYCERIN (NITROSTAT) 0.4 MG SL tablet Place 1 tablet (0.4 mg total) under the tongue every 5 (five) minutes as needed for chest pain. 30 tablet 0   oxyCODONE-acetaminophen (PERCOCET/ROXICET) 5-325 MG tablet Take 1 tablet by mouth at bedtime. 30 tablet 0   predniSONE (DELTASONE) 20 MG tablet Take 1 tablet (20 mg total) by mouth daily with breakfast. 30 tablet 0   promethazine (PHENERGAN) 25 MG tablet Take 25 mg by mouth every 6 (six) hours as needed for nausea.      rosuvastatin (CRESTOR) 40 MG tablet Take 1 tablet (40 mg total) by mouth daily. 30 tablet 0   tiZANidine (ZANAFLEX) 4 MG tablet Take 4 mg by mouth 3 (three) times daily as needed for muscle spasms.  traZODone (DESYREL) 50 MG tablet Take 50 mg by mouth at bedtime.     No current facility-administered medications for this visit.     Review of Systems    ***.  All other systems reviewed and are otherwise negative except as noted above.  Physical Exam    VS:  There were no vitals taken for this visit. , BMI There is no height or weight on file to calculate BMI.     GEN: Well nourished, well developed, in no acute distress. HEENT: normal. Neck: Supple, no JVD, carotid bruits, or masses. Cardiac: RRR, no murmurs, rubs, or gallops. No clubbing, cyanosis, edema.  Radials/DP/PT 2+ and equal bilaterally.  Respiratory:  Respirations regular and unlabored, clear to auscultation bilaterally. GI: Soft, nontender, nondistended, BS + x 4. MS: no deformity or atrophy. Skin: warm and dry, no rash. Neuro:  Strength and sensation are intact. Psych: Normal affect.  Accessory Clinical Findings    ECG personally reviewed by me today - *** - no acute changes.  Lab Results  Component Value Date   WBC 9.3 12/26/2020   HGB 9.5 (L) 12/26/2020   HCT 31.3 (L) 12/26/2020   MCV 91.8 12/26/2020   PLT 330 12/26/2020   Lab Results  Component Value Date   CREATININE 2.29 (H) 12/29/2020   BUN 37 (H)  12/29/2020   NA 139 12/29/2020   K 5.1 12/29/2020   CL 106 12/29/2020   CO2 27 12/29/2020   Lab Results  Component Value Date   ALT 12 11/27/2020   AST 12 (L) 11/27/2020   ALKPHOS 45 11/27/2020   BILITOT 0.7 11/27/2020   Lab Results  Component Value Date   CHOL 259 (H) 09/17/2020   HDL 43 09/17/2020   LDLCALC 142 (H) 09/17/2020   LDLDIRECT 99.3 (H) 11/09/2018   TRIG 370 (H) 09/17/2020   CHOLHDL 6.0 09/17/2020    Lab Results  Component Value Date   HGBA1C 8.4 (H) 12/24/2020    Assessment & Plan    1.  ***   Murray Hodgkins, NP 01/08/2021, 7:47 AM

## 2021-03-08 ENCOUNTER — Emergency Department: Payer: Medicare Other

## 2021-03-08 ENCOUNTER — Other Ambulatory Visit: Payer: Self-pay

## 2021-03-08 ENCOUNTER — Inpatient Hospital Stay
Admission: EM | Admit: 2021-03-08 | Discharge: 2021-03-10 | DRG: 291 | Disposition: A | Discharge status: Hospice | Payer: Medicare Other | Attending: Pulmonary Disease | Admitting: Pulmonary Disease

## 2021-03-08 ENCOUNTER — Encounter: Payer: Self-pay | Admitting: Pulmonary Disease

## 2021-03-08 DIAGNOSIS — K219 Gastro-esophageal reflux disease without esophagitis: Secondary | ICD-10-CM | POA: Diagnosis present

## 2021-03-08 DIAGNOSIS — Z79899 Other long term (current) drug therapy: Secondary | ICD-10-CM

## 2021-03-08 DIAGNOSIS — I132 Hypertensive heart and chronic kidney disease with heart failure and with stage 5 chronic kidney disease, or end stage renal disease: Principal | ICD-10-CM | POA: Diagnosis present

## 2021-03-08 DIAGNOSIS — E785 Hyperlipidemia, unspecified: Secondary | ICD-10-CM | POA: Diagnosis present

## 2021-03-08 DIAGNOSIS — Z8616 Personal history of COVID-19: Secondary | ICD-10-CM | POA: Diagnosis not present

## 2021-03-08 DIAGNOSIS — Z66 Do not resuscitate: Secondary | ICD-10-CM | POA: Diagnosis present

## 2021-03-08 DIAGNOSIS — Q613 Polycystic kidney, unspecified: Secondary | ICD-10-CM

## 2021-03-08 DIAGNOSIS — J440 Chronic obstructive pulmonary disease with acute lower respiratory infection: Secondary | ICD-10-CM | POA: Diagnosis present

## 2021-03-08 DIAGNOSIS — R0902 Hypoxemia: Secondary | ICD-10-CM

## 2021-03-08 DIAGNOSIS — Z794 Long term (current) use of insulin: Secondary | ICD-10-CM

## 2021-03-08 DIAGNOSIS — E039 Hypothyroidism, unspecified: Secondary | ICD-10-CM | POA: Diagnosis present

## 2021-03-08 DIAGNOSIS — N185 Chronic kidney disease, stage 5: Secondary | ICD-10-CM | POA: Diagnosis present

## 2021-03-08 DIAGNOSIS — Z6832 Body mass index (BMI) 32.0-32.9, adult: Secondary | ICD-10-CM

## 2021-03-08 DIAGNOSIS — E1122 Type 2 diabetes mellitus with diabetic chronic kidney disease: Secondary | ICD-10-CM | POA: Diagnosis present

## 2021-03-08 DIAGNOSIS — R680 Hypothermia, not associated with low environmental temperature: Secondary | ICD-10-CM | POA: Diagnosis present

## 2021-03-08 DIAGNOSIS — I959 Hypotension, unspecified: Secondary | ICD-10-CM

## 2021-03-08 DIAGNOSIS — R627 Adult failure to thrive: Secondary | ICD-10-CM | POA: Diagnosis present

## 2021-03-08 DIAGNOSIS — Z515 Encounter for palliative care: Secondary | ICD-10-CM

## 2021-03-08 DIAGNOSIS — I3139 Other pericardial effusion (noninflammatory): Secondary | ICD-10-CM | POA: Diagnosis present

## 2021-03-08 DIAGNOSIS — R571 Hypovolemic shock: Secondary | ICD-10-CM | POA: Diagnosis present

## 2021-03-08 DIAGNOSIS — R579 Shock, unspecified: Secondary | ICD-10-CM | POA: Diagnosis present

## 2021-03-08 DIAGNOSIS — R55 Syncope and collapse: Secondary | ICD-10-CM

## 2021-03-08 DIAGNOSIS — R570 Cardiogenic shock: Secondary | ICD-10-CM | POA: Diagnosis present

## 2021-03-08 DIAGNOSIS — G9341 Metabolic encephalopathy: Secondary | ICD-10-CM | POA: Insufficient documentation

## 2021-03-08 DIAGNOSIS — I5021 Acute systolic (congestive) heart failure: Secondary | ICD-10-CM | POA: Diagnosis present

## 2021-03-08 DIAGNOSIS — R4182 Altered mental status, unspecified: Secondary | ICD-10-CM | POA: Diagnosis not present

## 2021-03-08 DIAGNOSIS — J449 Chronic obstructive pulmonary disease, unspecified: Secondary | ICD-10-CM | POA: Diagnosis present

## 2021-03-08 DIAGNOSIS — T68XXXA Hypothermia, initial encounter: Secondary | ICD-10-CM

## 2021-03-08 DIAGNOSIS — I5043 Acute on chronic combined systolic (congestive) and diastolic (congestive) heart failure: Secondary | ICD-10-CM | POA: Diagnosis present

## 2021-03-08 DIAGNOSIS — Z96649 Presence of unspecified artificial hip joint: Secondary | ICD-10-CM | POA: Diagnosis present

## 2021-03-08 DIAGNOSIS — E538 Deficiency of other specified B group vitamins: Secondary | ICD-10-CM | POA: Diagnosis present

## 2021-03-08 DIAGNOSIS — Z9114 Patient's other noncompliance with medication regimen: Secondary | ICD-10-CM | POA: Diagnosis not present

## 2021-03-08 DIAGNOSIS — J9621 Acute and chronic respiratory failure with hypoxia: Secondary | ICD-10-CM | POA: Diagnosis present

## 2021-03-08 DIAGNOSIS — D631 Anemia in chronic kidney disease: Secondary | ICD-10-CM | POA: Diagnosis present

## 2021-03-08 DIAGNOSIS — N184 Chronic kidney disease, stage 4 (severe): Secondary | ICD-10-CM | POA: Diagnosis present

## 2021-03-08 DIAGNOSIS — J441 Chronic obstructive pulmonary disease with (acute) exacerbation: Secondary | ICD-10-CM | POA: Diagnosis present

## 2021-03-08 DIAGNOSIS — Z955 Presence of coronary angioplasty implant and graft: Secondary | ICD-10-CM

## 2021-03-08 DIAGNOSIS — R4189 Other symptoms and signs involving cognitive functions and awareness: Secondary | ICD-10-CM

## 2021-03-08 DIAGNOSIS — E8729 Other acidosis: Secondary | ICD-10-CM

## 2021-03-08 DIAGNOSIS — I2510 Atherosclerotic heart disease of native coronary artery without angina pectoris: Secondary | ICD-10-CM | POA: Diagnosis present

## 2021-03-08 DIAGNOSIS — Z7982 Long term (current) use of aspirin: Secondary | ICD-10-CM

## 2021-03-08 DIAGNOSIS — J9622 Acute and chronic respiratory failure with hypercapnia: Secondary | ICD-10-CM | POA: Diagnosis present

## 2021-03-08 DIAGNOSIS — Z825 Family history of asthma and other chronic lower respiratory diseases: Secondary | ICD-10-CM

## 2021-03-08 DIAGNOSIS — J189 Pneumonia, unspecified organism: Secondary | ICD-10-CM | POA: Diagnosis present

## 2021-03-08 DIAGNOSIS — Z8249 Family history of ischemic heart disease and other diseases of the circulatory system: Secondary | ICD-10-CM

## 2021-03-08 DIAGNOSIS — Z20822 Contact with and (suspected) exposure to covid-19: Secondary | ICD-10-CM | POA: Diagnosis present

## 2021-03-08 DIAGNOSIS — Z87891 Personal history of nicotine dependence: Secondary | ICD-10-CM

## 2021-03-08 DIAGNOSIS — E119 Type 2 diabetes mellitus without complications: Secondary | ICD-10-CM

## 2021-03-08 DIAGNOSIS — I255 Ischemic cardiomyopathy: Secondary | ICD-10-CM | POA: Diagnosis present

## 2021-03-08 DIAGNOSIS — Z7189 Other specified counseling: Secondary | ICD-10-CM | POA: Diagnosis not present

## 2021-03-08 DIAGNOSIS — I10 Essential (primary) hypertension: Secondary | ICD-10-CM | POA: Diagnosis present

## 2021-03-08 DIAGNOSIS — Z7985 Long-term (current) use of injectable non-insulin antidiabetic drugs: Secondary | ICD-10-CM

## 2021-03-08 DIAGNOSIS — I252 Old myocardial infarction: Secondary | ICD-10-CM

## 2021-03-08 DIAGNOSIS — J811 Chronic pulmonary edema: Secondary | ICD-10-CM

## 2021-03-08 LAB — BLOOD GAS, VENOUS
Acid-base deficit: 2.6 mmol/L — ABNORMAL HIGH (ref 0.0–2.0)
Acid-base deficit: 1.7 mmol/L (ref 0.0–2.0)
Bicarbonate: 27.9 mmol/L (ref 20.0–28.0)
Bicarbonate: 26.8 mmol/L (ref 20.0–28.0)
Delivery systems: POSITIVE
FIO2: 0.45
O2 Saturation: 72.7 %
O2 Saturation: 80.6 %
PEEP: 6 cmH2O
Patient temperature: 37
Patient temperature: 37
Pressure support: 16 cmH2O
pCO2, Ven: 90 mmHg (ref 44.0–60.0)
pCO2, Ven: 67 mmHg — ABNORMAL HIGH (ref 44.0–60.0)
pH, Ven: 7.1 — CL (ref 7.250–7.430)
pH, Ven: 7.21 — ABNORMAL LOW (ref 7.250–7.430)
pO2, Ven: 53 mmHg — ABNORMAL HIGH (ref 32.0–45.0)
pO2, Ven: 55 mmHg — ABNORMAL HIGH (ref 32.0–45.0)

## 2021-03-08 LAB — RESPIRATORY PANEL BY PCR

## 2021-03-08 LAB — CBC
HCT: 36.3 % (ref 36.0–46.0)
Hemoglobin: 9.9 g/dL — ABNORMAL LOW (ref 12.0–15.0)
MCH: 24.3 pg — ABNORMAL LOW (ref 26.0–34.0)
MCHC: 27.3 g/dL — ABNORMAL LOW (ref 30.0–36.0)
MCV: 89 fL (ref 80.0–100.0)
Platelets: 361 10*3/uL (ref 150–400)
RBC: 4.08 MIL/uL (ref 3.87–5.11)
RDW: 16.9 % — ABNORMAL HIGH (ref 11.5–15.5)
WBC: 12.5 10*3/uL — ABNORMAL HIGH (ref 4.0–10.5)
nRBC: 0 % (ref 0.0–0.2)

## 2021-03-08 LAB — COMPREHENSIVE METABOLIC PANEL
ALT: 12 U/L (ref 0–44)
AST: 10 U/L — ABNORMAL LOW (ref 15–41)
Albumin: 3.2 g/dL — ABNORMAL LOW (ref 3.5–5.0)
Alkaline Phosphatase: 43 U/L (ref 38–126)
Anion gap: 6 (ref 5–15)
BUN: 45 mg/dL — ABNORMAL HIGH (ref 8–23)
CO2: 26 mmol/L (ref 22–32)
Calcium: 8.4 mg/dL — ABNORMAL LOW (ref 8.9–10.3)
Chloride: 104 mmol/L (ref 98–111)
Creatinine, Ser: 2.47 mg/dL — ABNORMAL HIGH (ref 0.44–1.00)
GFR, Estimated: 19 mL/min — ABNORMAL LOW (ref >60)
Glucose, Bld: 275 mg/dL — ABNORMAL HIGH (ref 70–99)
Potassium: 5.5 mmol/L — ABNORMAL HIGH (ref 3.5–5.1)
Sodium: 136 mmol/L (ref 135–145)
Total Bilirubin: 0.9 mg/dL (ref 0.3–1.2)
Total Protein: 6.4 g/dL — ABNORMAL LOW (ref 6.5–8.1)

## 2021-03-08 LAB — CBC WITH DIFFERENTIAL/PLATELET
Abs Immature Granulocytes: 0.09 10*3/uL — ABNORMAL HIGH (ref 0.00–0.07)
Basophils Absolute: 0 10*3/uL (ref 0.0–0.1)
Basophils Relative: 0 %
Eosinophils Absolute: 0 10*3/uL (ref 0.0–0.5)
Eosinophils Relative: 0 %
HCT: 31.3 % — ABNORMAL LOW (ref 36.0–46.0)
Hemoglobin: 8.8 g/dL — ABNORMAL LOW (ref 12.0–15.0)
Immature Granulocytes: 1 %
Lymphocytes Relative: 5 %
Lymphs Abs: 0.6 10*3/uL — ABNORMAL LOW (ref 0.7–4.0)
MCH: 24 pg — ABNORMAL LOW (ref 26.0–34.0)
MCHC: 28.1 g/dL — ABNORMAL LOW (ref 30.0–36.0)
MCV: 85.5 fL (ref 80.0–100.0)
Monocytes Absolute: 0.2 10*3/uL (ref 0.1–1.0)
Monocytes Relative: 2 %
Neutro Abs: 9.4 10*3/uL — ABNORMAL HIGH (ref 1.7–7.7)
Neutrophils Relative %: 92 %
Platelets: 374 10*3/uL (ref 150–400)
RBC: 3.66 MIL/uL — ABNORMAL LOW (ref 3.87–5.11)
RDW: 16.9 % — ABNORMAL HIGH (ref 11.5–15.5)
WBC: 10.2 10*3/uL (ref 4.0–10.5)
nRBC: 0.2 % (ref 0.0–0.2)

## 2021-03-08 LAB — BASIC METABOLIC PANEL
Anion gap: 4 — ABNORMAL LOW (ref 5–15)
BUN: 47 mg/dL — ABNORMAL HIGH (ref 8–23)
CO2: 28 mmol/L (ref 22–32)
Calcium: 8.1 mg/dL — ABNORMAL LOW (ref 8.9–10.3)
Chloride: 104 mmol/L (ref 98–111)
Creatinine, Ser: 2.55 mg/dL — ABNORMAL HIGH (ref 0.44–1.00)
GFR, Estimated: 19 mL/min — ABNORMAL LOW (ref >60)
Glucose, Bld: 284 mg/dL — ABNORMAL HIGH (ref 70–99)
Potassium: 5.5 mmol/L — ABNORMAL HIGH (ref 3.5–5.1)
Sodium: 136 mmol/L (ref 135–145)

## 2021-03-08 LAB — URINALYSIS, ROUTINE W REFLEX MICROSCOPIC
Bilirubin Urine: NEGATIVE
Glucose, UA: NEGATIVE mg/dL
Ketones, ur: NEGATIVE mg/dL
Nitrite: POSITIVE — AB
Protein, ur: 30 mg/dL — AB
Specific Gravity, Urine: 1.015 (ref 1.005–1.030)
pH: 5 (ref 5.0–8.0)

## 2021-03-08 LAB — MAGNESIUM: Magnesium: 2.5 mg/dL — ABNORMAL HIGH (ref 1.7–2.4)

## 2021-03-08 LAB — MRSA NEXT GEN BY PCR, NASAL: MRSA by PCR Next Gen: NOT DETECTED

## 2021-03-08 LAB — PROTIME-INR
INR: 1 (ref 0.8–1.2)
Prothrombin Time: 13.1 seconds (ref 11.4–15.2)

## 2021-03-08 LAB — GLUCOSE, CAPILLARY
Glucose-Capillary: 194 mg/dL — ABNORMAL HIGH (ref 70–99)
Glucose-Capillary: 140 mg/dL — ABNORMAL HIGH (ref 70–99)
Glucose-Capillary: 100 mg/dL — ABNORMAL HIGH (ref 70–99)

## 2021-03-08 LAB — URINALYSIS, MICROSCOPIC (REFLEX): WBC, UA: >50 WBC/hpf (ref 0–5)

## 2021-03-08 LAB — TROPONIN I (HIGH SENSITIVITY)
Troponin I (High Sensitivity): 19 ng/L — ABNORMAL HIGH (ref <18)
Troponin I (High Sensitivity): 15 ng/L (ref <18)

## 2021-03-08 LAB — RESP PANEL BY RT-PCR (FLU A&B, COVID) ARPGX2
Influenza A by PCR: NEGATIVE
Influenza B by PCR: NEGATIVE
SARS Coronavirus 2 by RT PCR: NEGATIVE

## 2021-03-08 LAB — LACTIC ACID, PLASMA
Lactic Acid, Venous: 0.9 mmol/L (ref 0.5–1.9)
Lactic Acid, Venous: 0.8 mmol/L (ref 0.5–1.9)

## 2021-03-08 LAB — PROCALCITONIN: Procalcitonin: <0.1 ng/mL

## 2021-03-08 LAB — PHOSPHORUS: Phosphorus: 7.7 mg/dL — ABNORMAL HIGH (ref 2.5–4.6)

## 2021-03-08 LAB — CK: Total CK: 61 U/L (ref 38–234)

## 2021-03-08 LAB — BRAIN NATRIURETIC PEPTIDE: B Natriuretic Peptide: 1977.6 pg/mL — ABNORMAL HIGH (ref 0.0–100.0)

## 2021-03-08 MED ORDER — ALBUTEROL SULFATE (2.5 MG/3ML) 0.083% IN NEBU
2.5000 mg | INHALATION_SOLUTION | RESPIRATORY_TRACT | Status: DC
  Administered 2021-03-08: 18:00:00 2.5 mg via RESPIRATORY_TRACT
  Filled 2021-03-08: qty 3

## 2021-03-08 MED ORDER — ARFORMOTEROL TARTRATE 15 MCG/2ML IN NEBU
15.0000 ug | INHALATION_SOLUTION | Freq: Two times a day (BID) | RESPIRATORY_TRACT | Status: DC
  Administered 2021-03-08 – 2021-03-10 (×4): 15 ug via RESPIRATORY_TRACT
  Filled 2021-03-08 (×6): qty 2

## 2021-03-08 MED ORDER — INSULIN ASPART 100 UNIT/ML IJ SOLN
0.0000 [IU] | INTRAMUSCULAR | Status: DC
  Administered 2021-03-08 (×2): 1 [IU] via SUBCUTANEOUS
  Administered 2021-03-08: 10:00:00 2 [IU] via SUBCUTANEOUS
  Administered 2021-03-09: 20:00:00 1 [IU] via SUBCUTANEOUS
  Administered 2021-03-10: 15:00:00 3 [IU] via SUBCUTANEOUS
  Filled 2021-03-08 (×5): qty 1

## 2021-03-08 MED ORDER — CHLORHEXIDINE GLUCONATE CLOTH 2 % EX PADS
6.0000 | MEDICATED_PAD | Freq: Every day | CUTANEOUS | Status: DC
  Administered 2021-03-08 – 2021-03-10 (×2): 6 via TOPICAL

## 2021-03-08 MED ORDER — PROMETHAZINE HCL 25 MG PO TABS
25.0000 mg | ORAL_TABLET | Freq: Four times a day (QID) | ORAL | Status: DC | PRN
  Filled 2021-03-08: qty 1

## 2021-03-08 MED ORDER — PHENYLEPHRINE HCL-NACL 20-0.9 MG/250ML-% IV SOLN
25.0000 ug/min | INTRAVENOUS | Status: DC
  Filled 2021-03-08: qty 250

## 2021-03-08 MED ORDER — POLYETHYLENE GLYCOL 3350 17 G PO PACK: 17.0000 g | PACK | Freq: Every day | ORAL | Status: DC | PRN

## 2021-03-08 MED ORDER — SODIUM CHLORIDE 0.9 % IV SOLN
2.0000 g | INTRAVENOUS | Status: DC
  Administered 2021-03-08 – 2021-03-09 (×2): 2 g via INTRAVENOUS
  Filled 2021-03-08 (×2): qty 2
  Filled 2021-03-08 (×2): qty 20

## 2021-03-08 MED ORDER — BUDESONIDE 0.25 MG/2ML IN SUSP
0.2500 mg | Freq: Two times a day (BID) | RESPIRATORY_TRACT | Status: DC
  Administered 2021-03-08 – 2021-03-10 (×5): 0.25 mg via RESPIRATORY_TRACT
  Filled 2021-03-08 (×5): qty 2

## 2021-03-08 MED ORDER — FAMOTIDINE IN NACL 20-0.9 MG/50ML-% IV SOLN
20.0000 mg | INTRAVENOUS | Status: DC
  Administered 2021-03-08 – 2021-03-10 (×3): 20 mg via INTRAVENOUS
  Filled 2021-03-08 (×3): qty 50

## 2021-03-08 MED ORDER — CHLORHEXIDINE GLUCONATE CLOTH 2 % EX PADS: 6.0000 | MEDICATED_PAD | Freq: Every day | CUTANEOUS | Status: DC

## 2021-03-08 MED ORDER — DOCUSATE SODIUM 100 MG PO CAPS: 100.0000 mg | ORAL_CAPSULE | Freq: Two times a day (BID) | ORAL | Status: DC | PRN

## 2021-03-08 MED ORDER — SODIUM CHLORIDE 0.9 % IV SOLN: 250.0000 mL | INTRAVENOUS | Status: DC

## 2021-03-08 MED ORDER — TIZANIDINE HCL 4 MG PO TABS
4.0000 mg | ORAL_TABLET | Freq: Three times a day (TID) | ORAL | Status: DC | PRN
  Administered 2021-03-08 – 2021-03-09 (×3): 4 mg via ORAL
  Filled 2021-03-08 (×4): qty 1

## 2021-03-08 MED ORDER — FUROSEMIDE 10 MG/ML IJ SOLN
40.0000 mg | Freq: Once | INTRAMUSCULAR | Status: AC
  Administered 2021-03-08: 13:00:00 40 mg via INTRAVENOUS
  Filled 2021-03-08: qty 4

## 2021-03-08 MED ORDER — LACTATED RINGERS IV BOLUS
500.0000 mL | Freq: Once | INTRAVENOUS | Status: AC
  Administered 2021-03-08: 03:00:00 500 mL via INTRAVENOUS

## 2021-03-08 MED ORDER — LACTATED RINGERS IV BOLUS
500.0000 mL | Freq: Once | INTRAVENOUS | Status: AC
  Administered 2021-03-08: 01:00:00 500 mL via INTRAVENOUS

## 2021-03-08 MED ORDER — REVEFENACIN 175 MCG/3ML IN SOLN
175.0000 ug | Freq: Every day | RESPIRATORY_TRACT | Status: DC
  Administered 2021-03-09 – 2021-03-10 (×2): 175 ug via RESPIRATORY_TRACT
  Filled 2021-03-08 (×4): qty 3

## 2021-03-08 MED ORDER — VANCOMYCIN HCL IN DEXTROSE 1-5 GM/200ML-% IV SOLN
1000.0000 mg | Freq: Once | INTRAVENOUS | Status: AC
  Administered 2021-03-08: 02:00:00 1000 mg via INTRAVENOUS
  Filled 2021-03-08: qty 200

## 2021-03-08 MED ORDER — FAMOTIDINE IN NACL 20-0.9 MG/50ML-% IV SOLN: 20.0000 mg | Freq: Two times a day (BID) | INTRAVENOUS | Status: DC

## 2021-03-08 MED ORDER — ORAL CARE MOUTH RINSE
15.0000 mL | Freq: Two times a day (BID) | OROMUCOSAL | Status: DC
  Administered 2021-03-08 – 2021-03-10 (×3): 15 mL via OROMUCOSAL

## 2021-03-08 MED ORDER — MILRINONE LACTATE IN DEXTROSE 20-5 MG/100ML-% IV SOLN
0.1250 ug/kg/min | INTRAVENOUS | Status: AC
  Administered 2021-03-08 – 2021-03-09 (×2): 0.125 ug/kg/min via INTRAVENOUS
  Filled 2021-03-08 (×2): qty 100

## 2021-03-08 MED ORDER — HEPARIN SODIUM (PORCINE) 5000 UNIT/ML IJ SOLN
5000.0000 [IU] | Freq: Three times a day (TID) | INTRAMUSCULAR | Status: DC
  Administered 2021-03-08 – 2021-03-10 (×7): 5000 [IU] via SUBCUTANEOUS
  Filled 2021-03-08 (×8): qty 1

## 2021-03-08 MED ORDER — OXYCODONE-ACETAMINOPHEN 5-325 MG PO TABS
1.0000 | ORAL_TABLET | Freq: Three times a day (TID) | ORAL | Status: DC | PRN
  Administered 2021-03-08 – 2021-03-09 (×3): 1 via ORAL
  Filled 2021-03-08 (×3): qty 1

## 2021-03-08 MED ORDER — SODIUM CHLORIDE 0.9 % IV SOLN
2.0000 g | Freq: Once | INTRAVENOUS | Status: AC
  Administered 2021-03-08: 01:00:00 2 g via INTRAVENOUS
  Filled 2021-03-08: qty 2

## 2021-03-08 MED ORDER — NOREPINEPHRINE 4 MG/250ML-% IV SOLN
0.0000 ug/min | INTRAVENOUS | Status: DC
  Administered 2021-03-08: 04:00:00 2 ug/min via INTRAVENOUS
  Administered 2021-03-08: 14:00:00 5 ug/min via INTRAVENOUS
  Filled 2021-03-08 (×2): qty 250

## 2021-03-08 MED ORDER — ALBUTEROL SULFATE (2.5 MG/3ML) 0.083% IN NEBU: 2.5000 mg | INHALATION_SOLUTION | RESPIRATORY_TRACT | Status: DC | PRN

## 2021-03-08 MED ORDER — IPRATROPIUM-ALBUTEROL 0.5-2.5 (3) MG/3ML IN SOLN: 3.0000 mL | RESPIRATORY_TRACT | Status: DC | PRN

## 2021-03-08 MED ORDER — SODIUM CHLORIDE 0.9 % IV SOLN
1.0000 g | INTRAVENOUS | Status: DC
Start: 2021-03-08 — End: 2021-03-08

## 2021-03-08 NOTE — ED Provider Notes (Signed)
[facility] Emergency Department Provider Note   ____________________________________________   I have reviewed the triage vital signs and the nursing notes.   HISTORY  Chief Complaint Loss of Consciousness   History limited by and level 5 caveat due to: AMS  HPI Mckenzie Taylor is a 80 y.o. female who presents to the emergency department today because of concerns for being found on the ground.  Patient herself is unable to give any history.  It appears that family last talked patient yesterday.  They then found her on the ground today.  Patient was altered.  EMS found the patient to be hypoxic so did place her on non rebreather. Patient did arrive with a most form, patient is DNR, DNI.   Per medical record review patient has a history of frequent hospitalizations for CHF, NSTEMI.  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 2004 LHC with mild nonobs dzs: 20% mid LAD stenosis; b. 10/2020 Cath: LM 40ost/m, LAD 55p, 51m, 80d, D1 100, RI 40, LCX 30ost, RCA 75m; c. 12/2020 PCI: LM 40ost/mid, LAD 80ost/p, 60p (3.0x26 Onyx Frontier DES), 80d, D1 100, RI 40, LCX 30ost/p.   Chronic combined systolic and diastolic heart failure ([place])    a. 10/2018 Echo: EF 50-55%; b. 10/2020 Echo: EF 25-30%; c. 12/2020 Echo: EF 30-35%, glob HK, mild LVH, nl RV fxn.   CKD (chronic kidney disease), stage IV (HCC)    PCKD, CKD, adrenal adenoma, cyst   COPD (chronic obstructive pulmonary disease) ([place])    3L home oxygen   Diabetes type 2, controlled ([place])    Former heavy tobacco smoker    1 pk / day. Estimates quit ~ 2015   GERD (gastroesophageal reflux disease)    HTN (hypertension)    Hyperlipidemia with target LDL less than 70    Hypothyroidism    subclinical. low TSH, normal thyroid panel. biopsy 2010    Ischemic cardiomyopathy    a. 10/2018 Echo: EF 50-55%; b. 10/2020 Echo: EF 25-30%; c. 12/2020 Echo: EF 30-35%, glob HK, mild LVH, nl RV fxn.   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[place]) 11/27/2020   Vitamin B12 deficiency 11/27/2020   HTN (hypertension) 09/16/2020   HLD (hyperlipidemia) 09/16/2020   Diabetes type 2, controlled ([place]) 09/16/2020   CKD (chronic kidney disease), stage IV ([place]) 09/16/2020   Depression 09/16/2020   Chronic combined systolic and diastolic heart failure ([place]) 09/16/2020   Chest pain 11/08/2018   COPD with acute exacerbation ([place]) 08/14/2016   CAP (community acquired pneumonia) 08/14/2016   GERD (gastroesophageal reflux disease) 08/14/2016   Adrenal mass, left ([place]) 01/11/2012   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   Coronary atherosclerosis of native coronary artery 12/17/2008   CAD (coronary artery disease) 2004    Past Surgical History:  Procedure Laterality Date   CHOLECYSTECTOMY     CORONARY STENT INTERVENTION N/A 12/25/2020   Procedure: CORONARY STENT INTERVENTION;  Surgeon: Wellington Hampshire, MD;  Location: [place] [facility];  Service: Cardiovascular;  Laterality: N/A;   hip replacement     bilateral-secondary to avascular necrosis   LEFT HEART CATH AND CORONARY ANGIOGRAPHY N/A 10/17/2020   Procedure: LEFT HEART CATH AND CORONARY ANGIOGRAPHY;  Surgeon: Corey Skains, MD;  Location: [place] [facility];  Service: Cardiovascular;  Laterality: N/A;   LEFT HEART CATH AND CORONARY ANGIOGRAPHY N/A 12/25/2020   Procedure: LEFT HEART CATH AND CORONARY ANGIOGRAPHY;  Surgeon: Wellington Hampshire, MD;  Location: [place] [facility];  Service: Cardiovascular;  Laterality: N/A;   right eye lens  replacement     ULNAR NERVE REPAIR     bilateral   VESICOVAGINAL FISTULA CLOSURE W/ TAH      Prior to Admission medications   Medication Sig Start Date End Date Taking? Authorizing Provider  albuterol (VENTOLIN HFA) 108 (90 Base) MCG/ACT inhaler Inhale 2 puffs into the lungs every 4 (four) hours as needed for wheezing or shortness of breath.    [provider]  aspirin EC 81 MG EC tablet Take 1 tablet (81 mg total) by mouth daily. 11/11/18   Gladstone Lighter, MD  busPIRone (BUSPAR) 5 MG tablet Take 5 mg by mouth 2 (two) times daily.    [provider]  carvedilol (COREG) 12.5 MG tablet Take 1 tablet (12.5 mg total) by mouth 2 (two) times daily with a meal. 12/29/20 01/28/21  Nolberto Hanlon, MD  Cholecalciferol (VITAMIN D3) 5000 units CAPS Take 5,000 Units by mouth daily. Patient not taking: Reported on 12/24/2020    [provider]  Cyanocobalamin 1500 MCG TBDP Take 4,500 mcg by mouth daily.    [provider]  Dulaglutide (TRULICITY) 1.5 BM/8.4XL SOPN Inject 1.5 mg into the skin every Thursday.    [provider]  famotidine (PEPCID) 20 MG tablet Take 1 tablet (20 mg total) by mouth daily. 12/30/20 01/29/21  Nolberto Hanlon, MD  Fluticasone-Umeclidin-Vilant 100-62.5-25 MCG/INH AEPB Inhale 1 puff into the lungs daily.    [provider]  insulin detemir (LEVEMIR) 100 UNIT/ML FlexPen Inject 42 Units into the skin at bedtime.    [provider]  insulin lispro (HUMALOG) 100 UNIT/ML KwikPen Inject 0-22 Units into the skin 3 (three) times daily with meals. Sliding scale 10/26/18   [provider]  isosorbide mononitrate (IMDUR) 30 MG 24 hr tablet Take 1 tablet (30 mg total) by mouth daily. 12/30/20 01/29/21  Nolberto Hanlon, MD  nitroGLYCERIN (NITROSTAT) 0.4 MG SL tablet Place 1 tablet (0.4 mg total) under the tongue every 5 (five) minutes as needed for chest pain. 10/20/20   Max Sane, MD  oxyCODONE-acetaminophen (PERCOCET/ROXICET)  5-325 MG tablet Take 1 tablet by mouth at bedtime. 11/30/20   Masters, Joellen Jersey, DO  promethazine (PHENERGAN) 25 MG tablet Take 25 mg by mouth every 6 (six) hours as needed for nausea.     [provider]  rosuvastatin (CRESTOR) 40 MG tablet Take 1 tablet (40 mg total) by mouth daily. 12/30/20 01/29/21  Nolberto Hanlon, MD  tiZANidine (ZANAFLEX) 4 MG tablet Take 4 mg by mouth 3 (three) times daily as needed for muscle spasms.     [provider]  traZODone (DESYREL) 50 MG tablet Take 50 mg by mouth at bedtime. 10/28/20   [provider]    Allergies Other  Family History  Problem Relation Age of Onset   Cancer Father        lung   COPD Mother    Heart disease Mother    Heart disease Maternal Uncle  Social History Social History   Tobacco Use   Smoking status: Former    Packs/day: 1.00    Years: 50.00    Pack years: 50.00    Types: Cigarettes   Smokeless tobacco: Never   Tobacco comments:    1 ppd - 50 years   Substance Use Topics   Alcohol use: No   Drug use: No    Review of Systems Unable to obtain secondary to altered mental status.  ____________________________________________   PHYSICAL EXAM:  VITAL SIGNS: ED Triage Vitals  Enc Vitals Group     BP 03/08/21 0045 92/65     Pulse Rate 03/08/21 0024 (!) 58     Resp 03/08/21 0024 (!) 22     Temp 03/08/21 0024 (!) 91.8 F (33.2 C)     Temp Source 03/08/21 0024 Rectal     SpO2 03/08/21 0024 100 %     Weight 03/08/21 0022 191 lb (86.6 kg)     Height 03/08/21 0022 5\' 4"  (1.626 m)    Constitutional: Awake, not oriented. Eyes: Conjunctivae are normal.  ENT      Head: Normocephalic and atraumatic.      Nose: No congestion/rhinnorhea.      Mouth/Throat: Mucous membranes are moist.      Neck: No stridor. Hematological/Lymphatic/Immunilogical: No cervical lymphadenopathy. Cardiovascular: Bradycardic, regular rhythm.  No murmurs, rubs, or gallops.  Respiratory: Increased work of breathing.  Right lung with rhonchi. Gastrointestinal: Soft and non tender. No rebound. No guarding.  Genitourinary: Deferred Musculoskeletal: Normal range of motion in all extremities.  Neurologic:  AMS. Appears to be moving all extremities.  Skin:  Skin is warm, dry and intact. No rash noted. ____________________________________________    LABS (pertinent positives/negatives)  CBC wbc 12.5, hgb 9.9, plt 361 BNP 1977 VBG pH 7.10 CMP na 136, k 5.5, glu 275, cr 2.47 Lactic acid 0.8  ____________________________________________   EKG  I, Nance Pear, attending physician, personally viewed and interpreted this EKG  EKG Time: 0034 Rate: 76 Rhythm: sinus rhythm Axis: normal Intervals: qtc 478 QRS: narrow, q waves V1, III ST changes: no st elevation Impression: abnormal ekg, artifact limits interpretation.    ____________________________________________    RADIOLOGY  CT head/cervical spine No acute abnormality  CXR Right pleural effusion with basilar consolidate/atelectasis  ____________________________________________   PROCEDURES  Procedures  CRITICAL CARE Performed by: Nance Pear   Total critical care time: 45 minutes  Critical care time was exclusive of separately billable procedures and treating other patients.  Critical care was necessary to treat or prevent imminent or life-threatening deterioration.  Critical care was time spent personally by me on the following activities: development of treatment plan with patient and/or surrogate as well as nursing, discussions with consultants, evaluation of patient's response to treatment, examination of patient, obtaining history from patient or surrogate, ordering and performing treatments and interventions, ordering and review of laboratory studies, ordering and review of radiographic studies, pulse oximetry and re-evaluation of patient's condition.  ____________________________________________   INITIAL  IMPRESSION / ASSESSMENT AND PLAN / ED COURSE  Pertinent labs & imaging results that were available during my care of the patient were reviewed by me and considered in my medical decision making (see chart for details).   Patient presented to the emergency department today after being found down on the ground at her house.  Patient is unable to give any history.  Patient was hypoxic at the scene.  Patient was quite hypothermic here.  Did have initial concern for  possible infection.  Chest x-ray was concerning for pleural effusion as well as pneumonia.  Patient was started on antibiotics for healthcare associated pneumonia given recent hospitalization.  Patient was placed on Bair hugger given hypothermia.  CT head and cervical spine without acute findings.  VBG was concerning so patient was placed on BiPAP.  I did have a discussion with the son over the telephone.  Did discuss poor prognosis.  He did confirm patient was DNR and DNI. Given hypotension patient was placed on pressors. Discussed with ICU for admission. ____________________________________________   FINAL CLINICAL IMPRESSION(S) / ED DIAGNOSES  Final diagnoses:  Altered mental status, unspecified altered mental status type  Hypoxia  Hypotension, unspecified hypotension type  Pneumonia due to infectious organism, unspecified laterality, unspecified part of lung  Hypothermia, initial encounter     Note: This dictation was prepared with Dragon dictation. Any transcriptional errors that result from this process are unintentional     Nance Pear, MD 03/08/21 [phone]

## 2021-03-08 NOTE — ED Notes (Signed)
ED TO INPATIENT HANDOFF REPORT  ED Nurse Name and Phone #:   S Name/Age/Gender Mckenzie Taylor 80 y.o. female Room/Bed: ED17A/ED17A  Code Status   Code Status: DNR  Home/SNF/Other Home Patient oriented to: self Is this baseline?  Unknown   Triage Complete: Triage complete  Chief Complaint Shock [facility]) [R57.9]  Triage Note Pt was found on floor of home after syncopal episode per ems. Pt arrives on NRB at 15lpm, is cool to touch, pt is alert.    Allergies Allergies  Allergen Reactions   Other Rash    Pt reports allergy to metals. Patient also reports allergic to "ice" and it makes her skin swell where it touches.  She states she can drink water with no issues.    Level of Care/Admitting Diagnosis ED Disposition     ED Disposition  Admit   Condition  --   [place]: [place] [[id number]]  Level of Care: ICU [6]  Covid Evaluation: Symptomatic Person Under Investigation (PUI)  Diagnosis: Shock ([place]) [[id number]]  Admitting Physician: Rosana Hoes  Attending Physician: Tyler Pita [[id number]]  Estimated length of stay: past midnight tomorrow  Certification:: I certify this patient will need inpatient services for at least 2 midnights          B Medical/Surgery History Past Medical History:  Diagnosis Date   Anemia of chronic disease    Baseline hgb 8.0-8.9   Autosomal recessive polycystic kidneys    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 2004 [place] with mild nonobs dzs: 20% mid LAD stenosis; b. 10/2020 Cath: LM 40ost/m, LAD 55p, 35m, 80d, D1 100, RI 40, LCX 30ost, RCA 49m; c. 12/2020 PCI: LM 40ost/mid, LAD 80ost/p, 60p (3.0x26 Onyx Frontier DES), 80d, D1 100, RI 40, LCX 30ost/p.   Chronic combined systolic and diastolic heart failure ([place])    a. 10/2018 Echo: EF 50-55%; b. 10/2020 Echo: EF 25-30%; c. 12/2020 Echo: EF 30-35%, glob HK, mild LVH, nl RV fxn.   CKD (chronic  kidney disease), stage IV (HCC)    PCKD, CKD, adrenal adenoma, cyst   COPD (chronic obstructive pulmonary disease) ([place])    3L home oxygen   Diabetes type 2, controlled ([place])    Former heavy tobacco smoker    1 pk / day. Estimates quit ~ 2015   GERD (gastroesophageal reflux disease)    HTN (hypertension)    Hyperlipidemia with target LDL less than 70    Hypothyroidism    subclinical. low TSH, normal thyroid panel. biopsy 2010   Ischemic cardiomyopathy    a. 10/2018 Echo: EF 50-55%; b. 10/2020 Echo: EF 25-30%; c. 12/2020 Echo: EF 30-35%, glob HK, mild LVH, nl RV fxn.   Vitamin B12 deficiency    Past Surgical History:  Procedure Laterality Date   CHOLECYSTECTOMY     CORONARY STENT INTERVENTION N/A 12/25/2020   Procedure: CORONARY STENT INTERVENTION;  Surgeon: Wellington Hampshire, MD;  Location: [place] CV [facility];  Service: Cardiovascular;  Laterality: N/A;   hip replacement     bilateral-secondary to avascular necrosis   LEFT HEART CATH AND CORONARY ANGIOGRAPHY N/A 10/17/2020   Procedure: LEFT HEART CATH AND CORONARY ANGIOGRAPHY;  Surgeon: Corey Skains, MD;  Location: [place] [facility];  Service: Cardiovascular;  Laterality: N/A;   LEFT HEART CATH AND CORONARY ANGIOGRAPHY N/A 12/25/2020   Procedure: LEFT HEART CATH AND CORONARY ANGIOGRAPHY;  Surgeon:  Wellington Hampshire, MD;  Location: [place] [facility];  Service: Cardiovascular;  Laterality: N/A;   right eye lens replacement     ULNAR NERVE REPAIR     bilateral   VESICOVAGINAL FISTULA CLOSURE W/ TAH       A IV Location/Drains/Wounds Patient Lines/Drains/Airways Status     Active Line/Drains/Airways     Name Placement date Placement time Site Days   Peripheral IV 12/24/20 20 G Left Antecubital 12/24/20  --  Antecubital  74   Peripheral IV 03/08/21 22 G Left Antecubital 03/08/21  0038  Antecubital  less than 1            Intake/Output Last 24 hours  Intake/Output Summary (Last 24 hours) at 03/08/2021 0446 Last  data filed at 03/08/2021 0149 Gross per 24 hour  Intake 500 ml  Output --  Net 500 ml    Labs/Imaging Results for orders placed or performed during the hospital encounter of 03/08/21 (from the past 48 hour(s))  CBC     Status: Abnormal   Collection Time: 03/08/21 12:40 AM  Result Value Ref Range   WBC 12.5 (H) 4.0 - 10.5 K/uL   RBC 4.08 3.87 - 5.11 MIL/uL   Hemoglobin 9.9 (L) 12.0 - 15.0 g/dL   HCT 36.3 36.0 - 46.0 %   MCV 89.0 80.0 - 100.0 fL   MCH 24.3 (L) 26.0 - 34.0 pg   MCHC 27.3 (L) 30.0 - 36.0 g/dL   RDW 16.9 (H) 11.5 - 15.5 %   Platelets 361 150 - 400 K/uL   nRBC 0.0 0.0 - 0.2 %    Comment: Performed at [facility], [place], [place] [address]  Troponin I (High Sensitivity)     Status: Abnormal   Collection Time: 03/08/21 12:40 AM  Result Value Ref Range   Troponin I (High Sensitivity) 19 (H) <18 ng/L    Comment: (NOTE) Elevated high sensitivity troponin I (hsTnI) values and significant  changes across serial measurements may suggest ACS but many other  chronic and acute conditions are known to elevate hsTnI results.  Refer to the "Links" section for chest pain algorithms and additional  guidance. Performed at [facility], [address] [place], [place] [address]   Comprehensive metabolic panel     Status: Abnormal   Collection Time: 03/08/21 12:40 AM  Result Value Ref Range   Sodium 136 135 - 145 mmol/L   Potassium 5.5 (H) 3.5 - 5.1 mmol/L   Chloride 104 98 - 111 mmol/L   CO2 26 22 - 32 mmol/L   Glucose, Bld 275 (H) 70 - 99 mg/dL    Comment: Glucose reference range applies only to samples taken after fasting for at least 8 hours.   BUN 45 (H) 8 - 23 mg/dL   Creatinine, Ser 2.47 (H) 0.44 - 1.00 mg/dL   Calcium 8.4 (L) 8.9 - 10.3 mg/dL   Total Protein 6.4 (L) 6.5 - 8.1 g/dL   Albumin 3.2 (L) 3.5 - 5.0 g/dL   AST 10 (L) 15 - 41 U/L   ALT 12 0 - 44 U/L   Alkaline Phosphatase 43 38 - 126 U/L   Total Bilirubin 0.9 0.3 -  1.2 mg/dL   GFR, Estimated 19 (L) >60 mL/min    Comment: (NOTE) Calculated using the CKD-EPI Creatinine Equation (2021)    Anion gap 6 5 - 15    Comment: Performed at [facility], [address] [place], [place] [address]  Lactic acid, plasma     Status: None   Collection Time: 03/08/21 12:40 AM  Result Value Ref Range   Lactic Acid, Venous 0.9 0.5 - 1.9 mmol/L    Comment: Performed at [facility], [address] [place], [place] [address]  Protime-INR     Status: None   Collection Time: 03/08/21 12:40 AM  Result Value Ref Range   Prothrombin Time 13.1 11.4 - 15.2 seconds   INR 1.0 0.8 - 1.2    Comment: (NOTE) INR goal varies based on device and disease states. Performed at [facility], [address] [place], [place] [address]   CK     Status: None   Collection Time: 03/08/21 12:40 AM  Result Value Ref Range   Total CK 61 38 - 234 U/L    Comment: Performed at [facility], [address] [place], [place] [address]  Brain natriuretic peptide     Status: Abnormal   Collection Time: 03/08/21 12:40 AM  Result Value Ref Range   B Natriuretic Peptide 1,977.6 (H) 0.0 - 100.0 pg/mL    Comment: Performed at [facility], [address] [place], [place] [address]  Blood gas, venous     Status: Abnormal   Collection Time: 03/08/21  2:27 AM  Result Value Ref Range   pH, Ven 7.10 (LL) 7.250 - 7.430    Comment: CRITICAL RESULT CALLED TO, READ BACK BY AND VERIFIED WITH: Iva Lento MD AT [phone] 03/08/2021 BY S DAVID RRT    pCO2, Ven 90 (HH) 44.0 - 60.0 mmHg   pO2, Ven 53.0 (H) 32.0 - 45.0 mmHg   Bicarbonate 27.9 20.0 - 28.0 mmol/L   Acid-base deficit 2.6 (H) 0.0 - 2.0 mmol/L   O2 Saturation 72.7 %   Patient temperature 37.0    Collection site LINE    Sample type VENOUS     Comment: Performed at [facility], [address], [place], [place] [address]  Lactic acid, plasma     Status: None   Collection Time: 03/08/21   3:25 AM  Result Value Ref Range   Lactic Acid, Venous 0.8 0.5 - 1.9 mmol/L    Comment: Performed at [facility], [place], [place] [address]  Troponin I (High Sensitivity)     Status: None   Collection Time: 03/08/21  3:25 AM  Result Value Ref Range   Troponin I (High Sensitivity) 15 <18 ng/L    Comment: (NOTE) Elevated high sensitivity troponin I (hsTnI) values and significant  changes across serial measurements may suggest ACS but many other  chronic and acute conditions are known to elevate hsTnI results.  Refer to the "Links" section for chest pain algorithms and additional  guidance. Performed at [facility], [address] [place],  [address]    DG Chest 1 View  Result Date: 03/08/2021 CLINICAL DATA:  Confusion EXAM: CHEST  1 VIEW COMPARISON:  12/24/2020 FINDINGS: Patient rotated to the right. Mild cardiomegaly. Right pleural effusion with basilar consolidation/atelectasis. Mild left basilar opacity. IMPRESSION: Right pleural effusion with basilar consolidation/atelectasis. Electronically Signed   By: Ulyses Jarred M.D.   On: 03/08/2021 01:07   CT Head Wo Contrast  Result Date: 03/08/2021 CLINICAL DATA:  Syncope and fall EXAM: CT HEAD WITHOUT CONTRAST CT CERVICAL SPINE WITHOUT CONTRAST TECHNIQUE: Multidetector CT imaging of the head and cervical spine was performed following the standard protocol without intravenous contrast. Multiplanar CT image reconstructions of the cervical spine were  also generated. COMPARISON:  None. FINDINGS: CT HEAD FINDINGS Brain: There is no mass, hemorrhage or extra-axial collection. There is generalized atrophy without lobar predilection. There is hypoattenuation of the periventricular white matter, most commonly indicating chronic ischemic microangiopathy. Vascular: No abnormal hyperdensity of the major intracranial arteries or dural venous sinuses. No intracranial atherosclerosis. Skull: The visualized skull  base, calvarium and extracranial soft tissues are normal. Sinuses/Orbits: No fluid levels or advanced mucosal thickening of the visualized paranasal sinuses. No mastoid or middle ear effusion. The orbits are normal. CT CERVICAL SPINE FINDINGS Alignment: No static subluxation. Facets are aligned. Occipital condyles are normally positioned. Skull base and vertebrae: No acute fracture. Soft tissues and spinal canal: No prevertebral fluid or swelling. No visible canal hematoma. Disc levels: No advanced spinal canal or neural foraminal stenosis. Upper chest: No pneumothorax, pulmonary nodule or pleural effusion. Other: Normal visualized paraspinal cervical soft tissues. IMPRESSION: 1. Chronic ischemic microangiopathy and generalized atrophy without acute intracranial abnormality. 2. No acute fracture or static subluxation of the cervical spine. Electronically Signed   By: Ulyses Jarred M.D.   On: 03/08/2021 03:17   CT Cervical Spine Wo Contrast  Result Date: 03/08/2021 CLINICAL DATA:  Syncope and fall EXAM: CT HEAD WITHOUT CONTRAST CT CERVICAL SPINE WITHOUT CONTRAST TECHNIQUE: Multidetector CT imaging of the head and cervical spine was performed following the standard protocol without intravenous contrast. Multiplanar CT image reconstructions of the cervical spine were also generated. COMPARISON:  None. FINDINGS: CT HEAD FINDINGS Brain: There is no mass, hemorrhage or extra-axial collection. There is generalized atrophy without lobar predilection. There is hypoattenuation of the periventricular white matter, most commonly indicating chronic ischemic microangiopathy. Vascular: No abnormal hyperdensity of the major intracranial arteries or dural venous sinuses. No intracranial atherosclerosis. Skull: The visualized skull base, calvarium and extracranial soft tissues are normal. Sinuses/Orbits: No fluid levels or advanced mucosal thickening of the visualized paranasal sinuses. No mastoid or middle ear effusion. The  orbits are normal. CT CERVICAL SPINE FINDINGS Alignment: No static subluxation. Facets are aligned. Occipital condyles are normally positioned. Skull base and vertebrae: No acute fracture. Soft tissues and spinal canal: No prevertebral fluid or swelling. No visible canal hematoma. Disc levels: No advanced spinal canal or neural foraminal stenosis. Upper chest: No pneumothorax, pulmonary nodule or pleural effusion. Other: Normal visualized paraspinal cervical soft tissues. IMPRESSION: 1. Chronic ischemic microangiopathy and generalized atrophy without acute intracranial abnormality. 2. No acute fracture or static subluxation of the cervical spine. Electronically Signed   By: Ulyses Jarred M.D.   On: 03/08/2021 03:17    Pending Labs Unresulted Labs (From admission, onward)     Start     Ordered   03/08/21 0038  Culture, blood (Routine x 2)  BLOOD CULTURE X 2,   STAT      03/08/21 0038   03/08/21 0038  Urinalysis, Routine w reflex microscopic  ONCE - STAT,   STAT        03/08/21 0038            Vitals/Pain Today's Vitals   03/08/21 0436 03/08/21 0438 03/08/21 0440 03/08/21 0445  BP: (!) 93/47 (!) 95/54 91/63 101/81  Pulse: (!) 55 (!) 55 71 62  Resp: (!) 24 (!) 24 (!) 22 16  Temp:      TempSrc:      SpO2: 93% 94% 94% 93%  Weight:      Height:        Isolation Precautions No active isolations  Medications Medications  norepinephrine (  LEVOPHED) 4mg  in 252mL (0.016 mg/mL) premix infusion (10 mcg/min Intravenous Rate/Dose Change 03/08/21 0443)  docusate sodium (COLACE) capsule 100 mg (has no administration in time range)  polyethylene glycol (MIRALAX / GLYCOLAX) packet 17 g (has no administration in time range)  heparin injection 5,000 Units (has no administration in time range)  famotidine (PEPCID) IVPB 20 mg premix (has no administration in time range)  ceFEPIme (MAXIPIME) 2 g in sodium chloride 0.9 % 100 mL IVPB (0 g Intravenous Stopped 03/08/21 0142)  vancomycin (VANCOCIN) IVPB  1000 mg/200 mL premix (0 mg Intravenous Stopped 03/08/21 0245)  lactated ringers bolus 500 mL (0 mLs Intravenous Stopped 03/08/21 0149)  lactated ringers bolus 500 mL (0 mLs Intravenous Stopped 03/08/21 0246)    Mobility walks with device Moderate fall risk   Focused Assessments    R Recommendations: See Admitting Provider Note  Report given to:   Additional Notes:

## 2021-03-08 NOTE — Progress Notes (Signed)
Chaplain offered support at bedside in the form of prayer and compassionate presence.

## 2021-03-08 NOTE — ED Notes (Signed)
EDP at bedside  

## 2021-03-08 NOTE — Progress Notes (Signed)
Pt taken off bipap and placed on 5lpm [place], sats 100%, tolerating well at this time.

## 2021-03-08 NOTE — Plan of Care (Signed)
Continuing with plan of care. 

## 2021-03-08 NOTE — ED Notes (Signed)
Pt placed on bipap  

## 2021-03-08 NOTE — Consult Note (Signed)
Pharmacy Consult for Milrinone (Primacor) Initiation  Indication:   Acute Decompensated Heart Failure with volume overload and low cardiac output  Allergies  Allergen Reactions   Other Rash    Pt reports allergy to metals. Patient also reports allergic to "ice" and it makes her skin swell where it touches.  She states she can drink water with no issues.    Temp:  [90.8 F (32.7 C)-96.3 F (35.7 C)] 96.3 F (35.7 C) (12/25 0700) Pulse Rate:  [55-71] 65 (12/25 0700) Cardiac Rhythm: Normal sinus rhythm (12/25 0700) Resp:  [13-30] 20 (12/25 0700) BP: (72-119)/(36-92) 119/92 (12/25 0700) SpO2:  [89 %-100 %] 100 % (12/25 0721) Weight:  [86.6 kg (191 lb)] 86.6 kg (191 lb) (12/25 0022)  LABS    Component Value Date/Time   NA 136 03/08/2021 0325   NA 139 10/22/2012 0558   K 5.5 (H) 03/08/2021 0325   K 3.6 11/15/2013 1148   CL 104 03/08/2021 0325   CL 104 10/22/2012 0558   CO2 28 03/08/2021 0325   CO2 28 10/22/2012 0558   GLUCOSE 284 (H) 03/08/2021 0325   GLUCOSE 120 (H) 10/22/2012 0558   BUN 47 (H) 03/08/2021 0325   BUN 17 10/22/2012 0558   CREATININE 2.55 (H) 03/08/2021 0325   CREATININE 1.52 (H) 10/22/2012 0558   CALCIUM 8.1 (L) 03/08/2021 0325   CALCIUM 8.6 10/22/2012 0558   GFRNONAA 19 (L) 03/08/2021 0325   GFRNONAA 34 (L) 10/22/2012 0558   GFRAA 24 (L) 11/11/2018 0533   GFRAA 40 (L) 10/22/2012 0558   Last magnesium:  Lab Results  Component Value Date   MG 2.5 (H) 03/08/2021   Estimated Creatinine Clearance: 19.1 mL/min (A) (by C-G formula based on SCr of 2.55 mg/dL (H)). Serum creatinine: 2.55 mg/dL (H) 03/08/21 0325 Estimated creatinine clearance: 19.1 mL/min (A) estimated creatinine clearance is 19.1 mL/min (A) (by C-G formula based on SCr of 2.55 mg/dL (H)).   Intake/Output Summary (Last 24 hours) at 03/08/2021 1114 Last data filed at 03/08/2021 0149 Gross per 24 hour  Intake 500 ml  Output --  Net 500 ml    Filed Weights   03/08/21 0022  Weight: 86.6  kg (191 lb)    Assessment:  Patient is a 80 y.o. female admitted 03/08/2021 with acute decompensated congestive heart failure to be initiated on milrinone.  Patient with EF 30-35%. Currently hemodynamically unstable requiring Levophed at 10 mcg/min. Not currently on diuresis. Potassium, Magnesium, SCr, and vital signs are stable. Call physician for replacement if potassium is < 4 or magnesium is < 2 and replacement has not already been ordered.  Milrinone can cause arrhythmias.  Monitor patient for ECG changes.  Plan is to initiate milrinone for inotropic support.  Plan:  1. Initiate milrinone based on renal function: [employer] One Calculated CrCl Dose  []  > 50 ml/min 0.375 mcg/kg/min  []  20-49 ml/min 0.250 mcg/kg/min  [x]  < 20 ml/min 0.125 mcg/kg/min   2. Nursing to monitor vital signs per milrinone protocol and physician parameters. 3. Pharmacy to follow peripherally, please reconsult if needed or there is further questions. 4.  Please contact MD for further dosing instructions.  Thank you for allowing [place] to be a part of this patients care.  Benita Gutter  11:14 AM 03/08/2021

## 2021-03-08 NOTE — H&P (Signed)
NAME:  Mckenzie Taylor, MRN:  [id number], DOB:  [birth year], LOS: 0 ADMISSION DATE:  03/08/2021, CONSULTATION DATE:  03/08/21 REFERRING MD:  Dr. Archie Balboa, CHIEF COMPLAINT:  Unresponsiveness   History of Present Illness:  80 yo F presenting to [facility] ED on 03/08/21 from home via EMS after a syncopal episode at home. Per her husband, who is blind, he heard her fall and try to get back up- at that time EMS was called. The patient's son, [name], & husband discuss at length a recent failure to thrive picture. They report that the patient has not been compliant with medications, uninterested in eating/drinking, and has been unable to get up out of bed and move around for the last few days. No new complaints other than fatigue. ED course: Patient arrived on NRB @ 15 L, cool to tough but alert per ED documentation. Medications given: 1 L LR bolus, Cefepime & vancomycin, levophed drip started Initial Vitals: hypothermic 91.8, RR 19, SB 55, BP initially 98/58 then dropped into 80's & 70's Significant labs: (Labs/ Imaging personally reviewed) I, Domingo Pulse [place], AGACNP-BC, personally viewed and interpreted this ECG. EKG Interpretation: Date: 03/08/21, EKG Time: 00:34, Rate: 76, Rhythm: NSR, QRS Axis:  normal, Intervals: normal, ST/T Wave abnormalities: none, Narrative Interpretation: NSR (challenging read due to artifact and wandering leads) Chemistry: Na+:136, K+: 5.5, BUN/Cr.: 45/2.47, Serum CO2/ AG: 26/6 Hematology: WBC: 12.5, Hgb: 9.9,  Troponin: 19 > 15, BNP: 1977.6, Lactic/ PCT: 0.9 > 0.8/ pending, CK: 61 COVID-19 & Influenza A/B: pending, Resp panel: pending VBG: 7.10/ 90/ 53/ 27.9 CXR 03/08/21: R pleural effusion with basilar consolidation/atelectasis CT head wo contrast 03/08/21: Chronic ischemic microangiopathy and generalized atrophy without acute intracranial abnormality. CT cervical spine wo contrast 03/08/21: No acute fracture or static subluxation of the cervical spine.  PCCM  consulted for admission due to vasopressor administration.  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  Interim History / Subjective:  Patient responsive to physical stimuli with moaning and swatting of hands. No eye opening, unable to verbalize further or follow commands.  Objective   Blood pressure (!) 72/39, pulse (!) 58, temperature (!) 92.6 F (33.7 C), temperature source Rectal, resp. rate (!) 25, height 5\' 4"  (1.626 m), weight 86.6 kg, SpO2 93 %.        Intake/Output Summary (Last 24 hours) at 03/08/2021 0420 Last data filed at 03/08/2021 0149 Gross per 24 hour  Intake 500 ml  Output --  Net 500 ml   Filed Weights   03/08/21 0022  Weight: 86.6 kg    Examination: General: Adult female, critically ill, lying in bed, NAD on BIPAP  HEENT: MM pink/moist, anicteric, atraumatic, neck supple Neuro: RASS -4, unable to follow commands, PERRL +3, moves BUE against gravity, withdraws with BLE CV: s1s2 RRR, SB on monitor, no r/m/g Pulm: Regular, non labored on BIPAP @ 45%, breath sounds diminished throughout GI: soft, rounded, bs x 4 Skin: skin tear on L forearm, scattered ecchymosis Extremities: warm/dry, pulses + 2 R/P, +3 edema noted bilateral feet, and periorbital  Resolved Hospital Problem list     Assessment & Plan:  Acute on Chronic Hypercapnic Respiratory  Failure secondary to combined heart failure exacerbation vs CAP in the setting of COPD PMHx: COPD on chronic 3 L [place], combined CHF - Continue BIPAP overnight, wean FiO2 as tolerated - f/u VBG - Supplemental O2 to maintain SpO2 >  88% - Intermittent chest x-ray & ABG PRN - Ensure adequate pulmonary hygiene  - F/u cultures, trend PCT - Continue CAP coverage: ceftriaxone - budesonide nebs BID, bronchodilators PRN  Multifocal Shock secondary to suspected hypovolemia/dehydration vs cardiogenic Lactic & CK are WNL, but per family patient has not been eating or drinking normally. Pt received 1 L IVF bolus in ED without effect. CXR shows R pleural effusion with basilar consolidation/atelectasis and bilateral vascular congestion - continue peripheral levophed, wean as tolerated to maintain MAP > 65  - gentle IVF hydration due to combined CHF and pulmonary vascular congestion  Acute on Chronic combined systolic and diastolic heart failure exacerbation PMHx: CHF BNP: 1977.6, ECHO 12/2020: LVEF 30-35%, global hypokinesis in LV & unchanged small pericardial effusion - trend BNP - Continuous cardiac monitoring  - Daily BMP, replace electrolytes PRN - Daily weights to assess volume status - Diurese as BP and kidney function allow - Continue BIPAP thereapy as needed   CKD Stage IV - Strict I/O's: alert provider if UOP < 0.5 mL/kg/hr - Avoid nephrotoxic agents as able, ensure adequate renal perfusion  Type 2 Diabetes Mellitus Hemoglobin A1C: pending - Monitor CBG Q 4 hours - SSI sensitive dosing - target range while in ICU: 140-180 - follow ICU hyper/hypo-glycemia protocol  Failure to Thrive Per family's report, the patient is non compliant with medications & lately uninterested in eating/drinking. She complains of fatigue, telling family members she is tired.  - Palliative care consulted to assist with [place] discussions - supportive care  Best Practice (right click and "Reselect all SmartList Selections" daily)  Diet/type: NPO DVT prophylaxis: prophylactic heparin  GI prophylaxis: H2B Lines: N/A Foley:  Yes, and it is still needed Code Status:  DNR Last date of multidisciplinary goals of care discussion  [03/08/21] Patient arrived with MOST form and DNR/DNI was confirmed with son, [name] & husband  who also desire to treat the treatable, approving vasopressor support. Discussed how critically ill patient was & concern from family's description of outpatient behavior, that the patient appears to be in the dying process.  Labs   CBC: Recent Labs  Lab 03/08/21 0040  WBC 12.5*  HGB 9.9*  HCT 36.3  MCV 89.0  PLT 062    Basic Metabolic Panel: Recent Labs  Lab 03/08/21 0040  NA 136  K 5.5*  CL 104  CO2 26  GLUCOSE 275*  BUN 45*  CREATININE 2.47*  CALCIUM 8.4*   GFR: Estimated Creatinine Clearance: 19.7 mL/min (A) (by C-G formula based on SCr of 2.47 mg/dL (H)). Recent Labs  Lab 03/08/21 0040 03/08/21 0325  WBC 12.5*  --   LATICACIDVEN 0.9 0.8    Liver Function Tests: Recent Labs  Lab 03/08/21 0040  AST 10*  ALT 12  ALKPHOS 43  BILITOT 0.9  PROT 6.4*  ALBUMIN 3.2*   No results for input(s): LIPASE, AMYLASE in the last 168 hours. No results for input(s): AMMONIA in the last 168 hours.  ABG    Component Value Date/Time   PHART 7.41 11/25/2016 1512   PCO2ART 54 (H) 11/25/2016 1512   PO2ART 57 (L) 11/25/2016 1512   HCO3 27.9 03/08/2021 0227   ACIDBASEDEF 2.6 (H) 03/08/2021 0227   O2SAT 72.7 03/08/2021 0227  Coagulation Profile: Recent Labs  Lab 03/08/21 0040  INR 1.0    Cardiac Enzymes: Recent Labs  Lab 03/08/21 0040  CKTOTAL 61    HbA1C: Hgb A1c MFr Bld  Date/Time Value Ref Range Status  12/24/2020 11:25 PM 8.4 (H) 4.8 - 5.6 % Final    Comment:    (NOTE)         Prediabetes: 5.7 - 6.4         Diabetes: >6.4         Glycemic control for adults with diabetes: <7.0   09/16/2020 08:19 AM 8.4 (H) 4.8 - 5.6 % Final    Comment:    (NOTE)         Prediabetes: 5.7 - 6.4         Diabetes: >6.4         Glycemic control for adults with diabetes: <7.0     CBG: No results for input(s): GLUCAP in the last 168 hours.  Review of Systems:    UTA- patient unresponsive, unable to participate in interview  Past Medical History:  She,  has a past medical history of Anemia of chronic disease, Autosomal recessive polycystic kidneys, Avascular necrosis of hip ([place]), CAD (coronary artery disease), Chronic combined systolic and diastolic heart failure ([place]), CKD (chronic kidney disease), stage IV ([place]), COPD (chronic obstructive pulmonary disease) ([place]), Diabetes type 2, controlled ([place]), Former heavy tobacco smoker, GERD (gastroesophageal reflux disease), HTN (hypertension), Hyperlipidemia with target LDL less than 70, Hypothyroidism, Ischemic cardiomyopathy, and Vitamin B12 deficiency.   Surgical History:   Past Surgical History:  Procedure Laterality Date   CHOLECYSTECTOMY     CORONARY STENT INTERVENTION N/A 12/25/2020   Procedure: CORONARY STENT INTERVENTION;  Surgeon: Wellington Hampshire, MD;  Location: [place] [facility];  Service: Cardiovascular;  Laterality: N/A;   hip replacement     bilateral-secondary to avascular necrosis   LEFT HEART CATH AND CORONARY ANGIOGRAPHY N/A 10/17/2020   Procedure: LEFT HEART CATH AND CORONARY ANGIOGRAPHY;  Surgeon: Corey Skains, MD;  Location: [place] [facility];  Service: Cardiovascular;  Laterality: N/A;   LEFT HEART CATH AND CORONARY ANGIOGRAPHY N/A 12/25/2020   Procedure: LEFT HEART CATH AND CORONARY ANGIOGRAPHY;  Surgeon: Wellington Hampshire, MD;  Location: [place] [facility];  Service: Cardiovascular;  Laterality: N/A;   right eye lens replacement     ULNAR NERVE REPAIR     bilateral   VESICOVAGINAL FISTULA CLOSURE W/ TAH       Social History:   reports that she has quit smoking. She has a 50.00 pack-year smoking history. She has never used smokeless tobacco. She reports that she does not drink alcohol and does not use drugs.   Family History:  Her family history includes COPD in her mother; Cancer in her father; Heart disease in her maternal uncle and mother.    Allergies Allergies  Allergen Reactions   Other Rash    Pt reports allergy to metals. Patient also reports allergic to "ice" and it makes her skin swell where it touches.  She states she can drink water with no issues.     Home Medications  Prior to Admission medications   Medication Sig Start Date End Date Taking? Authorizing Provider  albuterol (VENTOLIN HFA) 108 (90 Base) MCG/ACT inhaler Inhale 2 puffs into the lungs every 4 (four) hours as needed for wheezing or shortness of breath.    [provider]  aspirin EC 81 MG EC tablet Take 1 tablet (81  mg total) by mouth daily. 11/11/18   Gladstone Lighter, MD  busPIRone (BUSPAR) 5 MG tablet Take 5 mg by mouth 2 (two) times daily.    [provider]  carvedilol (COREG) 12.5 MG tablet Take 1 tablet (12.5 mg total) by mouth 2 (two) times daily with a meal. 12/29/20 01/28/21  Nolberto Hanlon, MD  Cholecalciferol (VITAMIN D3) 5000 units CAPS Take 5,000 Units by mouth daily. Patient not taking: Reported on 12/24/2020    [provider]  Cyanocobalamin 1500 MCG TBDP Take 4,500 mcg by mouth daily.    [provider]  Dulaglutide (TRULICITY) 1.5 HM/0.9OB SOPN Inject 1.5 mg into the skin every Thursday.    [provider]  famotidine (PEPCID) 20 MG tablet Take 1 tablet (20 mg total) by mouth daily. 12/30/20 01/29/21  Nolberto Hanlon, MD  Fluticasone-Umeclidin-Vilant 100-62.5-25 MCG/INH AEPB Inhale 1 puff into the lungs daily.    [provider]  insulin detemir (LEVEMIR) 100 UNIT/ML FlexPen Inject 42 Units into the skin at bedtime.    [provider]  insulin lispro (HUMALOG) 100 UNIT/ML KwikPen Inject 0-22 Units into the skin 3 (three) times daily with meals. Sliding scale 10/26/18   [provider]  isosorbide mononitrate (IMDUR) 30 MG 24 hr tablet Take 1 tablet (30 mg total) by mouth daily. 12/30/20 01/29/21  Nolberto Hanlon, MD  nitroGLYCERIN (NITROSTAT) 0.4 MG SL tablet Place 1 tablet  (0.4 mg total) under the tongue every 5 (five) minutes as needed for chest pain. 10/20/20   Max Sane, MD  oxyCODONE-acetaminophen (PERCOCET/ROXICET) 5-325 MG tablet Take 1 tablet by mouth at bedtime. 11/30/20   Masters, Joellen Jersey, DO  promethazine (PHENERGAN) 25 MG tablet Take 25 mg by mouth every 6 (six) hours as needed for nausea.     [provider]  rosuvastatin (CRESTOR) 40 MG tablet Take 1 tablet (40 mg total) by mouth daily. 12/30/20 01/29/21  Nolberto Hanlon, MD  tiZANidine (ZANAFLEX) 4 MG tablet Take 4 mg by mouth 3 (three) times daily as needed for muscle spasms.     [provider]  traZODone (DESYREL) 50 MG tablet Take 50 mg by mouth at bedtime. 10/28/20   [provider]     Critical care time: 57 minutes       Venetia Night, AGACNP-BC Acute Care Nurse Practitioner [facility]   [phone] / [phone] Please see Amion for pager details.

## 2021-03-08 NOTE — Progress Notes (Signed)
CBG 129 

## 2021-03-08 NOTE — Progress Notes (Addendum)
22g x 2.5 inch piv cath placed in R anterior forearm.  Size and tip location marked with skin marker. U/S used for placement.  Blood return noted upon insertion.

## 2021-03-08 NOTE — ED Notes (Signed)
EDP aware of rectal temp and v/s

## 2021-03-08 NOTE — ED Triage Notes (Signed)
Pt was found on floor of home after syncopal episode per ems. Pt arrives on NRB at 15lpm, is cool to touch, pt is alert.

## 2021-03-09 ENCOUNTER — Inpatient Hospital Stay: Payer: Medicare Other

## 2021-03-09 DIAGNOSIS — J9621 Acute and chronic respiratory failure with hypoxia: Secondary | ICD-10-CM

## 2021-03-09 DIAGNOSIS — J9622 Acute and chronic respiratory failure with hypercapnia: Secondary | ICD-10-CM

## 2021-03-09 DIAGNOSIS — Z7189 Other specified counseling: Secondary | ICD-10-CM

## 2021-03-09 DIAGNOSIS — R4182 Altered mental status, unspecified: Secondary | ICD-10-CM

## 2021-03-09 DIAGNOSIS — I5021 Acute systolic (congestive) heart failure: Secondary | ICD-10-CM

## 2021-03-09 LAB — GLUCOSE, CAPILLARY
Glucose-Capillary: 103 mg/dL — ABNORMAL HIGH (ref 70–99)
Glucose-Capillary: 105 mg/dL — ABNORMAL HIGH (ref 70–99)
Glucose-Capillary: 109 mg/dL — ABNORMAL HIGH (ref 70–99)
Glucose-Capillary: 112 mg/dL — ABNORMAL HIGH (ref 70–99)
Glucose-Capillary: 119 mg/dL — ABNORMAL HIGH (ref 70–99)
Glucose-Capillary: 125 mg/dL — ABNORMAL HIGH (ref 70–99)
Glucose-Capillary: 129 mg/dL — ABNORMAL HIGH (ref 70–99)
Glucose-Capillary: 259 mg/dL — ABNORMAL HIGH (ref 70–99)
Glucose-Capillary: 80 mg/dL (ref 70–99)

## 2021-03-09 LAB — BASIC METABOLIC PANEL
Anion gap: 7 (ref 5–15)
BUN: 49 mg/dL — ABNORMAL HIGH (ref 8–23)
CO2: 26 mmol/L (ref 22–32)
Calcium: 7.8 mg/dL — ABNORMAL LOW (ref 8.9–10.3)
Chloride: 106 mmol/L (ref 98–111)
Creatinine, Ser: 3.09 mg/dL — ABNORMAL HIGH (ref 0.44–1.00)
GFR, Estimated: 15 mL/min — ABNORMAL LOW (ref >60)
Glucose, Bld: 100 mg/dL — ABNORMAL HIGH (ref 70–99)
Potassium: 4 mmol/L (ref 3.5–5.1)
Sodium: 139 mmol/L (ref 135–145)

## 2021-03-09 LAB — BLOOD GAS, VENOUS
Acid-Base Excess: 3.3 mmol/L — ABNORMAL HIGH (ref 0.0–2.0)
Bicarbonate: 29 mmol/L — ABNORMAL HIGH (ref 20.0–28.0)
O2 Saturation: 72.3 %
Patient temperature: 37
pCO2, Ven: 49 mmHg (ref 44.0–60.0)
pH, Ven: 7.38 (ref 7.250–7.430)
pO2, Ven: 39 mmHg (ref 32.0–45.0)

## 2021-03-09 LAB — PHOSPHORUS: Phosphorus: 3.3 mg/dL (ref 2.5–4.6)

## 2021-03-09 LAB — MAGNESIUM: Magnesium: 2.2 mg/dL (ref 1.7–2.4)

## 2021-03-09 LAB — PROCALCITONIN: Procalcitonin: 0.13 ng/mL

## 2021-03-09 NOTE — Progress Notes (Signed)
Discussed with Asencion Gowda NP, who saw the patient for Palliative Care.  Family confirms that they would want to have patient be discharged home with hospice care.  Have spoken with Flo Shanks, RN with [facility] and patient will have the necessary items needed at home for discharge tomorrow home with hospice.  She will not require BiPAP.  Only oxygen supplementation.  Treat breathlessness as needed with morphine sulfate.  Renold Don, MD Advanced Bronchoscopy PCCM [facility]

## 2021-03-09 NOTE — Consult Note (Signed)
Consultation Note Date: 03/09/2021   Patient Name: Mckenzie Taylor  DOB: November 16, 1940  MRN: [id number]  Age / Sex: 80 y.o., female  PCP: Mckenzie Lank, MD Referring Physician: Tyler Pita, MD  Reason for Consultation: Establishing goals of care  HPI/Patient Profile: 80 yo F presenting to [facility] ED on 03/08/21 from home via EMS after a syncopal episode at home. Per her husband, who is blind, he heard her fall and try to get back up- at that time EMS was called. The patient's son, Mckenzie Taylor, & husband discuss at length a recent failure to thrive picture. They report that the patient has not been compliant with medications, uninterested in eating/drinking, and has been unable to get up out of bed and move around for the last few days. No new complaints other than fatigue.  Clinical Assessment and Goals of Care: Patient is resting in bed with granddaughter at bedside. She dozes frequently during conversation. She is currently on Milrinone to help with diuresis. They state she lives at home with her husband who is blind. There is someone who comes to be with them, but patient is not getting her medications as she should. Granddaughter tells me she has declined over the past few months and her appetite has decreased. She has deconditioned and declined and only wants to sleep and drink Pepsi. Patient states she is tired, graddaughter states she has said this previously.   Patient is clear she wants her son Mckenzie Taylor to make healthcare decisions.   Called son Mckenzie Taylor on granddaughter's speakerphone at bedside. He also voices frustration with her care at home. He too discusses her decline over the past few months, and states it began after she had covid. She felt bad and only wanted to lay in bed. She continued to stay mostly in bed.  He states prior to covid, she was up and about in the house and would take her own medications  and care for herself.   We discussed her diagnoses, prognosis, GOC, EOL wishes disposition and options.  Created space and opportunity for patient  to explore thoughts and feelings regarding current medical information.   A detailed discussion was had today regarding advanced directives.  Concepts specific to code status, artifical feeding and hydration, IV antibiotics and rehospitalization were discussed.  The difference between an aggressive medical intervention path and a comfort care path was discussed.  Values and goals of care important to patient and family were attempted to be elicited.  Discussed limitations of medical interventions to prolong quality of life in some situations and discussed the concept of human mortality. Granddaughter and son both state they do not want her to suffer, and QOL is very important.   Patient states she wants to be at home with her family. She would like to have comfort focused care where she can do things on her terms and have symptoms managed. She would like to remain at home until death. Son Mckenzie Taylor is amenable to this and inquires about equipment at home. Discussed having hospice  reach out to initiate planning. Patient is clear she would like to continue what care is needed to keep her stable for transport home as she does not want to die in the hospital.     [place] with hospice  Prognosis:  < 6 months       Primary Diagnoses: Present on Admission:  Acute systolic CHF (congestive heart failure) (HCC)  COPD (chronic obstructive pulmonary disease) ([place])  HTN (hypertension)  CKD (chronic kidney disease), stage IV ([place])  CAD (coronary artery disease)  Shock ([place])   I have reviewed the medical record, interviewed the patient and family, and examined the patient. The following aspects are pertinent.  Past Medical History:  Diagnosis Date   Anemia of chronic disease    Baseline hgb 8.0-8.9   Autosomal recessive  polycystic kidneys    Avascular necrosis of hip ([place])    bilateral   CAD (coronary artery disease)    a. 2004 LHC with mild nonobs dzs: 20% mid LAD stenosis; b. 10/2020 Cath: LM 40ost/m, LAD 55p, 41m, 80d, D1 100, RI 40, LCX 30ost, RCA 23m; c. 12/2020 PCI: LM 40ost/mid, LAD 80ost/p, 60p (3.0x26 Onyx Frontier DES), 80d, D1 100, RI 40, LCX 30ost/p.   Chronic combined systolic and diastolic heart failure ([place])    a. 10/2018 Echo: EF 50-55%; b. 10/2020 Echo: EF 25-30%; c. 12/2020 Echo: EF 30-35%, glob HK, mild LVH, nl RV fxn.   CKD (chronic kidney disease), stage IV (HCC)    PCKD, CKD, adrenal adenoma, cyst   COPD (chronic obstructive pulmonary disease) ([place])    3L home oxygen   Diabetes type 2, controlled ([place])    Former heavy tobacco smoker    1 pk / day. Estimates quit ~ 2015   GERD (gastroesophageal reflux disease)    HTN (hypertension)    Hyperlipidemia with target LDL less than 70    Hypothyroidism    subclinical. low TSH, normal thyroid panel. biopsy 2010   Ischemic cardiomyopathy    a. 10/2018 Echo: EF 50-55%; b. 10/2020 Echo: EF 25-30%; c. 12/2020 Echo: EF 30-35%, glob HK, mild LVH, nl RV fxn.   Vitamin B12 deficiency    Social History   Socioeconomic History   Marital status: Married    Spouse name: Not on file   Number of children: Not on file   Years of education: Not on file   Highest education level: Not on file  Occupational History   Not on file  Tobacco Use   Smoking status: Former    Packs/day: 1.00    Years: 50.00    Pack years: 50.00    Types: Cigarettes   Smokeless tobacco: Never   Tobacco comments:    1 ppd - 50 years   Substance and Sexual Activity   Alcohol use: No   Drug use: No   Sexual activity: Not Currently  Other Topics Concern   Not on file  Social History Narrative   Separated, has 2 adult children.    Retired Regulatory affairs officer, on disability.    Social Determinants of Health   Financial Resource Strain: Not on file  Food Insecurity: Not on file   Transportation Needs: Not on file  Physical Activity: Not on file  Stress: Not on file  Social Connections: Not on file   Family History  Problem Relation Age of Onset   Cancer Father        lung   COPD Mother    Heart disease Mother  Heart disease Maternal Uncle    Scheduled Meds:  arformoterol  15 mcg Nebulization BID   budesonide (PULMICORT) nebulizer solution  0.25 mg Nebulization BID   Chlorhexidine Gluconate Cloth  6 each Topical Daily   heparin  5,000 Units Subcutaneous Q8H   insulin aspart  0-9 Units Subcutaneous Q4H   mouth rinse  15 mL Mouth Rinse BID   revefenacin  175 mcg Nebulization Daily   Continuous Infusions:  sodium chloride     cefTRIAXone (ROCEPHIN)  IV Stopped (03/09/21 0045)   famotidine (PEPCID) IV 20 mg (03/09/21 0929)   milrinone 0.125 mcg/kg/min (03/09/21 0747)   norepinephrine (LEVOPHED) Adult infusion Stopped (03/08/21 1548)   PRN Meds:.albuterol, docusate sodium, oxyCODONE-acetaminophen, polyethylene glycol, promethazine, tiZANidine Medications Prior to Admission:  Prior to Admission medications   Medication Sig Start Date End Date Taking? Authorizing Provider  albuterol (VENTOLIN HFA) 108 (90 Base) MCG/ACT inhaler Inhale 2 puffs into the lungs every 4 (four) hours as needed for wheezing or shortness of breath.    [provider]  aspirin EC 81 MG EC tablet Take 1 tablet (81 mg total) by mouth daily. 11/11/18   Gladstone Lighter, MD  busPIRone (BUSPAR) 5 MG tablet Take 5 mg by mouth 2 (two) times daily.    [provider]  carvedilol (COREG) 12.5 MG tablet Take 1 tablet (12.5 mg total) by mouth 2 (two) times daily with a meal. 12/29/20 01/28/21  Nolberto Hanlon, MD  Cholecalciferol (VITAMIN D3) 5000 units CAPS Take 5,000 Units by mouth daily. Patient not taking: Reported on 12/24/2020    [provider]  Cyanocobalamin 1500 MCG TBDP Take 4,500 mcg by mouth daily.    [provider]  Dulaglutide (TRULICITY) 1.5  LO/7.5IE SOPN Inject 1.5 mg into the skin every Thursday.    [provider]  famotidine (PEPCID) 20 MG tablet Take 1 tablet (20 mg total) by mouth daily. 12/30/20 01/29/21  Nolberto Hanlon, MD  Fluticasone-Umeclidin-Vilant 100-62.5-25 MCG/INH AEPB Inhale 1 puff into the lungs daily.    [provider]  insulin detemir (LEVEMIR) 100 UNIT/ML FlexPen Inject 42 Units into the skin at bedtime.    [provider]  insulin lispro (HUMALOG) 100 UNIT/ML KwikPen Inject 0-22 Units into the skin 3 (three) times daily with meals. Sliding scale 10/26/18   [provider]  isosorbide mononitrate (IMDUR) 30 MG 24 hr tablet Take 1 tablet (30 mg total) by mouth daily. 12/30/20 01/29/21  Nolberto Hanlon, MD  nitroGLYCERIN (NITROSTAT) 0.4 MG SL tablet Place 1 tablet (0.4 mg total) under the tongue every 5 (five) minutes as needed for chest pain. 10/20/20   Max Sane, MD  oxyCODONE-acetaminophen (PERCOCET/ROXICET) 5-325 MG tablet Take 1 tablet by mouth at bedtime. 11/30/20   Masters, Joellen Jersey, DO  promethazine (PHENERGAN) 25 MG tablet Take 25 mg by mouth every 6 (six) hours as needed for nausea.     [provider]  rosuvastatin (CRESTOR) 40 MG tablet Take 1 tablet (40 mg total) by mouth daily. 12/30/20 01/29/21  Nolberto Hanlon, MD  tiZANidine (ZANAFLEX) 4 MG tablet Take 4 mg by mouth 3 (three) times daily as needed for muscle spasms.     [provider]  traZODone (DESYREL) 50 MG tablet Take 50 mg by mouth at bedtime. 10/28/20   [provider]   Allergies  Allergen Reactions   Other Rash    Pt reports allergy to metals. Patient also reports allergic to "ice" and it makes her skin swell where it touches.  She states she can drink water with no issues.   Review of Systems  Respiratory:         Orthopnea   Physical Exam Pulmonary:     Effort: Pulmonary effort is normal.  Skin:    General: Skin is warm and dry.  Neurological:     Mental Status: She is alert.     Vital Signs: BP 119/68    Pulse (!) 102    Temp 99 F (37.2 C) (Bladder)    Resp (!) 21    Ht 5\' 4"  (1.626 m)    Wt 86.6 kg    SpO2 94%    BMI 32.79 kg/m  Pain Scale: 0-10 POSS *See Group Information*: 1-Acceptable,Awake and alert Pain Score: 8    SpO2: SpO2: 94 % O2 Device:SpO2: 94 % O2 Flow Rate: .O2 Flow Rate (L/min): 4 L/min  IO: Intake/output summary:  Intake/Output Summary (Last 24 hours) at 03/09/2021 1150 Last data filed at 03/09/2021 4235 Gross per 24 hour  Intake 500.78 ml  Output 2520 ml  Net -2019.22 ml    LBM: Last BM Date:  (pta) Baseline Weight: Weight: 86.6 kg Most recent weight: Weight: 86.6 kg      Time In: 10:40 Time Out: 11:30 Time Total: 50 min Greater than 50%  of this time was spent counseling and coordinating care related to the above assessment and plan.  Signed by: Asencion Gowda, NP   Please contact Palliative Medicine Team phone at [phone] for questions and concerns.  For individual provider: See Shea Evans

## 2021-03-09 NOTE — Progress Notes (Signed)
NAME:  Mckenzie Taylor, MRN:  [id number], DOB:  [birth year], LOS: 1 ADMISSION DATE:  03/08/2021, CONSULTATION DATE:  03/08/21 REFERRING MD:  Dr. Archie Balboa, CHIEF COMPLAINT:  Unresponsiveness   History of Present Illness:  80 yo F presenting to [facility] ED on 03/08/21 from home via EMS after a syncopal episode at home. Per her husband, who is blind, he heard her fall and try to get back up- at that time EMS was called. The patient's son, [name], & husband discuss at length a recent failure to thrive picture. They report that the patient has not been compliant with medications, uninterested in eating/drinking, and has been unable to get up out of bed and move around for the last few days. No new complaints other than fatigue. ED course: Patient arrived on NRB @ 15 L, cool to touch but alert per ED documentation. Medications given: 1 L LR bolus, Cefepime & vancomycin, levophed drip started Initial Vitals: hypothermic 91.8, RR 19, SB 55, BP initially 98/58 then dropped into 80's & 70's Significant labs: (Labs/ Imaging personally reviewed) I, Domingo Pulse [place], AGACNP-BC, personally viewed and interpreted this ECG. EKG Interpretation: Date: 03/08/21, EKG Time: 00:34, Rate: 76, Rhythm: NSR, QRS Axis:  normal, Intervals: normal, ST/T Wave abnormalities: none, Narrative Interpretation: NSR (challenging read due to artifact and wandering leads) Chemistry: Na+:136, K+: 5.5, BUN/Cr.: 45/2.47, Serum CO2/ AG: 26/6 Hematology: WBC: 12.5, Hgb: 9.9,  Troponin: 19 > 15, BNP: 1977.6, Lactic/ PCT: 0.9 > 0.8/ pending, CK: 61 COVID-19 & Influenza A/B: pending, Resp panel: pending VBG: 7.10/ 90/ 53/ 27.9 CXR 03/08/21: R pleural effusion with basilar consolidation/atelectasis CT head wo contrast 03/08/21: Chronic ischemic microangiopathy and generalized atrophy without acute intracranial abnormality. CT cervical spine wo contrast 03/08/21: No acute fracture or static subluxation of the cervical spine.  PCCM  consulted for admission due to vasopressor administration.  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  On BiPAP as needed  Interim History / Subjective:  She is more interactive.  Still requiring BiPAP intermittently.  Can ask for needs.  Objective   Blood pressure 120/61, pulse 95, temperature 98.4 F (36.9 C), resp. rate (!) 27, height 5\' 4"  (1.626 m), weight 86.6 kg, SpO2 94 %.    FiO2 (%):  [35 %] 35 %   Intake/Output Summary (Last 24 hours) at 03/09/2021 6378 Last data filed at 03/09/2021 0745 Gross per 24 hour  Intake 500.78 ml  Output 2245 ml  Net -1744.22 ml    Filed Weights   03/08/21 0022  Weight: 86.6 kg    Examination: General: Adult female, chronically ill appearing, lying in bed, NAD on BIPAP  HEENT: MM pink/moist, anicteric, atraumatic, neck supple Neuro: RASS -4, unable to follow commands, PERRL +3, moves BUE against gravity, withdraws with BLE CV: s1s2 RRR, SB on monitor, no r/m/g Pulm: Regular, non labored on BIPAP @ 45%, breath sounds diminished throughout GI: soft, rounded, bs x 4 Skin: skin tear on L forearm, scattered ecchymosis Extremities: warm/dry, pulses + 2 R/P, +3 edema noted bilateral feet, and periorbital  Resolved Hospital Problem list     Assessment & Plan:  Acute on Chronic Hypercapnic Respiratory Failure secondary to combined heart failure exacerbation vs CAP in the setting of COPD PMHx: COPD on chronic 3 L [place], combined CHF - Continue BIPAP overnight, wean FiO2 as tolerated - Supplemental O2 to maintain  SpO2 > 88% - Intermittent chest x-ray & ABG PRN - Ensure adequate pulmonary hygiene  - F/u cultures, trend PCT - Continue CAP coverage: ceftriaxone - budesonide nebs BID, bronchodilators PRN  Multifocal Shock secondary to suspected hypovolemia/dehydration vs cardiogenic Lactic & CK are WNL, but per family patient has not been eating or drinking normally. Pt received 1 L IVF bolus in ED without effect. CXR shows R pleural effusion with basilar consolidation/atelectasis and bilateral vascular congestion - Weaned off of Levophed - On milrinone with increasing urine output  Acute on Chronic combined systolic and diastolic heart failure exacerbation PMHx: CHF BNP: 1977.6, ECHO 12/2020: LVEF 30-35%, global hypokinesis in LV & unchanged small pericardial effusion - trend BNP - Continuous cardiac monitoring  - Daily BMP, replace electrolytes PRN - Daily weights to assess volume status - Diurese as BP and kidney function allow - Continue BIPAP thereapy as needed  - Milrinone  CKD Stage IV - Strict I/O's: alert provider if UOP < 0.5 mL/kg/hr - Avoid nephrotoxic agents as able, ensure adequate renal perfusion  Type 2 Diabetes Mellitus Hemoglobin A1C: pending - Monitor CBG Q 4 hours - SSI sensitive dosing - target range while in ICU: 140-180 - follow ICU hyper/hypo-glycemia protocol  Failure to Thrive Per family's report, the patient is non compliant with medications & lately uninterested in eating/drinking. She complains of fatigue, telling family members she is tired.  - Palliative care consulted to assist with [place] discussions to see today - supportive care  Best Practice (right click and "Reselect all SmartList Selections" daily)  Diet/type: NPO DVT prophylaxis: prophylactic heparin  GI prophylaxis: H2B Lines: N/A Foley:  Yes, and it is still needed Code Status:  DNR Last date of multidisciplinary goals of care discussion [03/08/21] Patient arrived with MOST form and DNR/DNI  was confirmed with son, [name] & husband  who also desire to treat the treatable, approving vasopressor support. Discussed how critically ill patient was & concern from family's description of outpatient behavior, that the patient appears to be in the dying process.  Labs   CBC: Recent Labs  Lab 03/08/21 0040 03/08/21 0705  WBC 12.5* 10.2  NEUTROABS  --  9.4*  HGB 9.9* 8.8*  HCT 36.3 31.3*  MCV 89.0 85.5  PLT 361 374     Basic Metabolic Panel: Recent Labs  Lab 03/08/21 0040 03/08/21 0325 03/09/21 0508  NA 136 136 139  K 5.5* 5.5* 4.0  CL 104 104 106  CO2 26 28 26   GLUCOSE 275* 284* 100*  BUN 45* 47* 49*  CREATININE 2.47* 2.55* 3.09*  CALCIUM 8.4* 8.1* 7.8*  MG  --  2.5* 2.2  PHOS  --  7.7* 3.3    GFR: Estimated Creatinine Clearance: 15.7 mL/min (A) (by C-G formula based on SCr of 3.09 mg/dL (H)). Recent Labs  Lab 03/08/21 0040 03/08/21 0325 03/08/21 0705 03/09/21 0508  PROCALCITON  --  <0.10  --  0.13  WBC 12.5*  --  10.2  --   LATICACIDVEN 0.9 0.8  --   --      Liver Function Tests: Recent Labs  Lab 03/08/21 0040  AST 10*  ALT 12  ALKPHOS 43  BILITOT 0.9  PROT 6.4*  ALBUMIN 3.2*    No  results for input(s): LIPASE, AMYLASE in the last 168 hours. No results for input(s): AMMONIA in the last 168 hours.  ABG    Component Value Date/Time   PHART 7.41 11/25/2016 1512   PCO2ART 54 (H) 11/25/2016 1512   PO2ART 57 (L) 11/25/2016 1512   HCO3 26.8 03/08/2021 0600   ACIDBASEDEF 1.7 03/08/2021 0600   O2SAT 80.6 03/08/2021 0600      Coagulation Profile: Recent Labs  Lab 03/08/21 0040  INR 1.0     Cardiac Enzymes: Recent Labs  Lab 03/08/21 0040  CKTOTAL 61     HbA1C: Hgb A1c MFr Bld  Date/Time Value Ref Range Status  12/24/2020 11:25 PM 8.4 (H) 4.8 - 5.6 % Final    Comment:    (NOTE)         Prediabetes: 5.7 - 6.4         Diabetes: >6.4         Glycemic control for adults with diabetes: <7.0   09/16/2020 08:19 AM 8.4 (H) 4.8 - 5.6  % Final    Comment:    (NOTE)         Prediabetes: 5.7 - 6.4         Diabetes: >6.4         Glycemic control for adults with diabetes: <7.0     CBG: Recent Labs  Lab 03/08/21 1612 03/08/21 1953 03/09/21 0035 03/09/21 0325 03/09/21 0811  GLUCAP 129* 100* 109* 112* 103*    Review of Systems:   UTA- patient unresponsive, unable to participate in interview  Allergies Allergies  Allergen Reactions   Other Rash    Pt reports allergy to metals. Patient also reports allergic to "ice" and it makes her skin swell where it touches.  She states she can drink water with no issues.     Home Medications  Prior to Admission medications   Medication Sig Start Date End Date Taking? Authorizing Provider  albuterol (VENTOLIN HFA) 108 (90 Base) MCG/ACT inhaler Inhale 2 puffs into the lungs every 4 (four) hours as needed for wheezing or shortness of breath.    [provider]  aspirin EC 81 MG EC tablet Take 1 tablet (81 mg total) by mouth daily. 11/11/18   Gladstone Lighter, MD  busPIRone (BUSPAR) 5 MG tablet Take 5 mg by mouth 2 (two) times daily.    [provider]  carvedilol (COREG) 12.5 MG tablet Take 1 tablet (12.5 mg total) by mouth 2 (two) times daily with a meal. 12/29/20 01/28/21  Nolberto Hanlon, MD  Cholecalciferol (VITAMIN D3) 5000 units CAPS Take 5,000 Units by mouth daily. Patient not taking: Reported on 12/24/2020    [provider]  Cyanocobalamin 1500 MCG TBDP Take 4,500 mcg by mouth daily.    [provider]  Dulaglutide (TRULICITY) 1.5 XB/2.8UX SOPN Inject 1.5 mg into the skin every Thursday.    [provider]  famotidine (PEPCID) 20 MG tablet Take 1 tablet (20 mg total) by mouth daily. 12/30/20 01/29/21  Nolberto Hanlon, MD  Fluticasone-Umeclidin-Vilant 100-62.5-25 MCG/INH AEPB Inhale 1 puff into the lungs daily.    [provider]  insulin detemir (LEVEMIR) 100 UNIT/ML FlexPen Inject 42 Units into the skin at bedtime.     [provider]  insulin lispro (HUMALOG) 100 UNIT/ML KwikPen Inject 0-22 Units into the skin 3 (three) times daily with meals. Sliding scale 10/26/18   [provider]  isosorbide mononitrate (IMDUR) 30 MG 24 hr tablet Take 1 tablet (30 mg  total) by mouth daily. 12/30/20 01/29/21  Nolberto Hanlon, MD  nitroGLYCERIN (NITROSTAT) 0.4 MG SL tablet Place 1 tablet (0.4 mg total) under the tongue every 5 (five) minutes as needed for chest pain. 10/20/20   Max Sane, MD  oxyCODONE-acetaminophen (PERCOCET/ROXICET) 5-325 MG tablet Take 1 tablet by mouth at bedtime. 11/30/20   Masters, Joellen Jersey, DO  promethazine (PHENERGAN) 25 MG tablet Take 25 mg by mouth every 6 (six) hours as needed for nausea.     [provider]  rosuvastatin (CRESTOR) 40 MG tablet Take 1 tablet (40 mg total) by mouth daily. 12/30/20 01/29/21  Nolberto Hanlon, MD  tiZANidine (ZANAFLEX) 4 MG tablet Take 4 mg by mouth 3 (three) times daily as needed for muscle spasms.     [provider]  traZODone (DESYREL) 50 MG tablet Take 50 mg by mouth at bedtime. 10/28/20   [provider]    Scheduled Meds:  arformoterol  15 mcg Nebulization BID   budesonide (PULMICORT) nebulizer solution  0.25 mg Nebulization BID   Chlorhexidine Gluconate Cloth  6 each Topical Daily   heparin  5,000 Units Subcutaneous Q8H   insulin aspart  0-9 Units Subcutaneous Q4H   mouth rinse  15 mL Mouth Rinse BID   revefenacin  175 mcg Nebulization Daily   Continuous Infusions:  sodium chloride     cefTRIAXone (ROCEPHIN)  IV Stopped (03/09/21 0045)   famotidine (PEPCID) IV Stopped (03/08/21 1011)   milrinone 0.125 mcg/kg/min (03/09/21 0747)   norepinephrine (LEVOPHED) Adult infusion Stopped (03/08/21 1548)   PRN Meds:.albuterol, docusate sodium, oxyCODONE-acetaminophen, polyethylene glycol, promethazine, tiZANidine  Level 3 follow-up    Multidisciplinary rounds were performed with the ICU team.  Palliative care consultation  pending.  Renold Don, MD Advanced Bronchoscopy PCCM [facility]    *This note was dictated using voice recognition software/Dragon.  Despite best efforts to proofread, errors can occur which can change the meaning. Any transcriptional errors that result from this process are unintentional and may not be fully corrected at the time of dictation.

## 2021-03-09 NOTE — Progress Notes (Signed)
Pt Alert oriented x 2-3. PRN pain medication for toe pain. Pt became SOB with accessory muscle use, RR 25-30, HR 111 pulse ox 91. Repositioned and sat up in bed. RT placed pt back on bi-pap. Will continue to monitor.

## 2021-03-09 NOTE — Progress Notes (Signed)
SLP Cancellation Note  Patient Details Name: Mckenzie Taylor MRN: [id number] DOB: 31-May-1940   Cancelled treatment:       Reason Eval/Treat Not Completed: SLP consult received and appreciated. Chart review completed. SLP evaluation not completed at this time due to medical issues which prohibited therapy. Per chart review and RN report, pt requiring BiPap support. SLP to defer clinical swallowing efforts at this time due to respiratory status. RN to contact SLP should pt's respiratory status support safe PO intake. RN aware and in agreement.   Will continue efforts as appropriate.  Cherrie Gauze, M.S., [facility] [phone] ([place])  Quintella Baton 03/09/2021, 1:16 PM

## 2021-03-09 NOTE — Consult Note (Signed)
Pharmacy Consult for Milrinone (Primacor) Initiation  Indication:   Acute Decompensated Heart Failure with volume overload and low cardiac output  Allergies  Allergen Reactions   Other Rash    Pt reports allergy to metals. Patient also reports allergic to "ice" and it makes her skin swell where it touches.  She states she can drink water with no issues.    Temp:  [97.7 F (36.5 C)-99.5 F (37.5 C)] 98.4 F (36.9 C) (12/26 0800) Pulse Rate:  [68-104] 104 (12/26 0931) Cardiac Rhythm: Normal sinus rhythm (12/26 0730) Resp:  [13-30] 23 (12/26 0931) BP: (86-136)/(38-99) 94/68 (12/26 0931) SpO2:  [80 %-100 %] 95 % (12/26 0931) FiO2 (%):  [35 %] 35 % (12/26 0400)  LABS    Component Value Date/Time   NA 139 03/09/2021 0508   NA 139 10/22/2012 0558   K 4.0 03/09/2021 0508   K 3.6 11/15/2013 1148   CL 106 03/09/2021 0508   CL 104 10/22/2012 0558   CO2 26 03/09/2021 0508   CO2 28 10/22/2012 0558   GLUCOSE 100 (H) 03/09/2021 0508   GLUCOSE 120 (H) 10/22/2012 0558   BUN 49 (H) 03/09/2021 0508   BUN 17 10/22/2012 0558   CREATININE 3.09 (H) 03/09/2021 0508   CREATININE 1.52 (H) 10/22/2012 0558   CALCIUM 7.8 (L) 03/09/2021 0508   CALCIUM 8.6 10/22/2012 0558   GFRNONAA 15 (L) 03/09/2021 0508   GFRNONAA 34 (L) 10/22/2012 0558   GFRAA 24 (L) 11/11/2018 0533   GFRAA 40 (L) 10/22/2012 0558   Last magnesium:  Lab Results  Component Value Date   MG 2.2 03/09/2021   Estimated Creatinine Clearance: 15.7 mL/min (A) (by C-G formula based on SCr of 3.09 mg/dL (H)). Serum creatinine: 3.09 mg/dL (H) 03/09/21 5621 Estimated creatinine clearance: 15.7 mL/min (A) estimated creatinine clearance is 15.7 mL/min (A) (by C-G formula based on SCr of 3.09 mg/dL (H)).   Intake/Output Summary (Last 24 hours) at 03/09/2021 1131 Last data filed at 03/09/2021 0950 Gross per 24 hour  Intake 500.78 ml  Output 2520 ml  Net -2019.22 ml     Filed Weights   03/08/21 0022  Weight: 86.6 kg (191 lb)     Assessment:  Patient is a 80 y.o. female admitted 03/08/2021 with acute decompensated congestive heart failure to be initiated on milrinone.  Patient with EF 30-35%. Currently hemodynamically unstable requiring Levophed at 10 mcg/min. Not currently on diuresis. Potassium, Magnesium, SCr, and vital signs are stable. Call physician for replacement if potassium is < 4 or magnesium is < 2 and replacement has not already been ordered.  Milrinone can cause arrhythmias.  Monitor patient for ECG changes.  Plan is to initiate milrinone for inotropic support.  Plan:  1. Continue milrinone based on renal function: Select One Calculated CrCl Dose  []  > 50 ml/min 0.375 mcg/kg/min  []  20-49 ml/min 0.250 mcg/kg/min  [x]  < 20 ml/min 0.125 mcg/kg/min   2. Nursing to monitor vital signs per milrinone protocol and physician parameters. 3. Mg and K are within goal and require no replacement  4.  Please contact MD for further dosing instructions.  Thank you for allowing [place] to be a part of this patients care.  Narda Rutherford, PharmD Pharmacy Resident  03/09/2021 11:32 AM

## 2021-03-09 NOTE — Progress Notes (Signed)
Milrinone gtt discontinued per verbal order from Rufina Falco, NP

## 2021-03-09 NOTE — Progress Notes (Signed)
[facility] Liaison note:  Referral received for [facility] hospice services at home from [facility]. Patient information given to referral. Hospice eligibility is pending. Writer spoke via telephone to patient's son Mckenzie Taylor. Hospice services reviewed, questions answered. Mckenzie Taylor confimed the focus is on comfort, no BIPAP, nasal cannula only. Comfort medications, no "extra Meds"   DME needs: Hospital bed with top half rails, patient currently has oxygen in the home through [facility]. DME requested for delivery tomorrow 12/27. Per conversation with attending physician Dr. Duwayne Heck plan is for discharge home tomorrow after DME is in place.  Patient will require non Emergent EMS transport at discharge.  Hospital care team updated.  Liaison to follow trough discharge.  Flo Shanks BSN, RN, [place] [phone]

## 2021-03-09 NOTE — TOC Initial Note (Addendum)
Transition of Care [facility]) - Initial/Assessment Note    Patient Details  Name: Mckenzie Taylor MRN: [id number] Date of Birth: [birth year]  Transition [facility]) CM/SW Contact:    Kerin Salen, RN Phone Number: 03/09/2021, 1:20 PM  Clinical Narrative:  Patient lives at home with blind husband and family reports non-compliant with medications and noted decline in health. Family discussed comfort measures with Palliative and agrees to Hospice at Home. TOC notified Hospice liaison to assist with hospice home placement if needed. TOC to continue to track and assist as needed.     Patient to discharge home with Hospice 03/10/21, DME ordered and patient will need to have non-emergent transportation arranged. TOC to assist with transportation.                   Patient Goals and CMS Choice        Expected Discharge Plan and Services                                                Prior Living Arrangements/Services                       Activities of Daily Living Home Assistive Devices/Equipment: Eyeglasses, Oxygen, Walker (specify type) ADL Screening (condition at time of admission) Patient's cognitive ability adequate to safely complete daily activities?: Yes Is the patient deaf or have difficulty hearing?: No Does the patient have difficulty seeing, even when wearing glasses/contacts?: No Does the patient have difficulty concentrating, remembering, or making decisions?: No Patient able to express need for assistance with ADLs?: Yes Does the patient have difficulty dressing or bathing?: Yes Independently performs ADLs?: No Communication: Independent Dressing (OT): Needs assistance Is this a change from baseline?: Pre-admission baseline Grooming: Needs assistance Is this a change from baseline?: Pre-admission baseline Feeding: Needs assistance (setup) Is this a change from baseline?: Pre-admission baseline Bathing: Needs assistance Is this a change  from baseline?: Pre-admission baseline Toileting: Needs assistance Is this a change from baseline?: Pre-admission baseline In/Out Bed: Needs assistance Is this a change from baseline?: Pre-admission baseline Walks in Home: Dependent Is this a change from baseline?: Pre-admission baseline Does the patient have difficulty walking or climbing stairs?: Yes Weakness of Legs: Both Weakness of Arms/Hands: Both  Permission Sought/Granted                  Emotional Assessment              Admission diagnosis:  Shock ([place]) [R57.9] Hypoxia [R09.02] Hypothermia, initial encounter [T68.XXXA] Hypotension, unspecified hypotension type [I95.9] Altered mental status, unspecified altered mental status type [R41.82] Pneumonia due to infectious organism, unspecified laterality, unspecified part of lung [J18.9] Patient Active Problem List   Diagnosis Date Noted   Syncope 81/82/9937   Acute metabolic encephalopathy 16/96/7893   Hypothermia 03/08/2021   Respiratory acidosis 03/08/2021   Unresponsiveness 03/08/2021   Shock ([place]) 03/08/2021   Unstable angina ([place])    Hypothyroidism 12/24/2020   COVID-19 virus infection 11/29/2020   Acute on chronic HFrEF (heart failure with reduced ejection fraction) ([place]) 11/27/2020   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   Vitamin B12 deficiency 11/27/2020   HTN (hypertension) 09/16/2020   HLD (hyperlipidemia) 09/16/2020   Diabetes type 2, controlled ([place]) 09/16/2020   CKD (chronic kidney disease), stage IV ([place]) 09/16/2020  Depression 09/16/2020   Chronic combined systolic and diastolic heart failure ([place]) 09/16/2020   Acute on chronic combined systolic and diastolic CHF (congestive heart failure) ([place]) 11/10/2018   Chest pain 11/08/2018   COPD (chronic obstructive pulmonary disease) ([place]) 11/25/2016   Acute on chronic respiratory failure with hypoxia and hypercapnia (HCC) 10/25/2016   COPD with acute exacerbation ([place]) 08/14/2016    HCAP (healthcare-associated pneumonia) 61/95/0932   Acute systolic CHF (congestive heart failure) ([place]) 08/14/2016   GERD (gastroesophageal reflux disease) 08/14/2016   Adrenal mass, left ([place]) 01/11/2012   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   Coronary atherosclerosis of native coronary artery 12/17/2008   CAD (coronary artery disease) 2004   PCP:  Denton Lank, MD Pharmacy:   [facility], [place], [address], [place] [address] Phone: [phone] Fax: [place], [place] [address] Phone: [phone] Fax: [phone]     Social Determinants of Health (SDOH) Interventions    Readmission Risk Interventions Readmission Risk Prevention Plan 10/18/2020  Transportation Screening Complete  Medication Review (RN Care Manager) Complete  PCP or Specialist appointment within 3-5 days of discharge Complete  HRI or [place] Complete  SW Recovery Care/Counseling Consult Complete  [place] Not Applicable  Some recent data might be hidden

## 2021-03-10 LAB — PROCALCITONIN: Procalcitonin: <0.1 ng/mL

## 2021-03-10 LAB — GLUCOSE, CAPILLARY
Glucose-Capillary: 106 mg/dL — ABNORMAL HIGH (ref 70–99)
Glucose-Capillary: 95 mg/dL (ref 70–99)
Glucose-Capillary: 113 mg/dL — ABNORMAL HIGH (ref 70–99)
Glucose-Capillary: 234 mg/dL — ABNORMAL HIGH (ref 70–99)

## 2021-03-10 LAB — BASIC METABOLIC PANEL
Anion gap: 8 (ref 5–15)
BUN: 42 mg/dL — ABNORMAL HIGH (ref 8–23)
CO2: 29 mmol/L (ref 22–32)
Calcium: 8.1 mg/dL — ABNORMAL LOW (ref 8.9–10.3)
Chloride: 104 mmol/L (ref 98–111)
Creatinine, Ser: 2.63 mg/dL — ABNORMAL HIGH (ref 0.44–1.00)
GFR, Estimated: 18 mL/min — ABNORMAL LOW (ref >60)
Glucose, Bld: 106 mg/dL — ABNORMAL HIGH (ref 70–99)
Potassium: 3.7 mmol/L (ref 3.5–5.1)
Sodium: 141 mmol/L (ref 135–145)

## 2021-03-10 LAB — HEMOGLOBIN A1C
Hgb A1c MFr Bld: 7.2 % — ABNORMAL HIGH (ref 4.8–5.6)
Mean Plasma Glucose: 160 mg/dL

## 2021-03-10 LAB — MAGNESIUM: Magnesium: 2.1 mg/dL (ref 1.7–2.4)

## 2021-03-10 LAB — PHOSPHORUS: Phosphorus: 3 mg/dL (ref 2.5–4.6)

## 2021-03-10 NOTE — TOC Transition Note (Signed)
Transition of Care [facility]) - CM/SW Discharge Note   Patient Details  Name: Mckenzie Taylor MRN: [id number] Date of Birth: September 27, 1940  [facility]) CM/SW Contact:  Alberteen Sam, LCSW Phone Number: 03/10/2021, 3:06 PM   Clinical Narrative:     [facility].   Per family they request [facility] transport.   CSW has called [facility] at [phone] and requested transport. Clinicals have been faxed to them at [phone].   Family aware, no further discharge needs identified at this time.          Patient Goals and CMS Choice        Discharge Placement                       Discharge Plan and Services                                     Social Determinants of Health (SDOH) Interventions     Readmission Risk Interventions Readmission Risk Prevention Plan 10/18/2020  Transportation Screening Complete  Medication Review ([place] Level) Complete  PCP or Specialist appointment within 3-5 days of discharge Complete  HRI or [place] Complete  SW Recovery Care/Counseling Consult Complete  [place] Not Applicable  Some recent data might be hidden

## 2021-03-10 NOTE — Consult Note (Signed)
PHARMACY CONSULT NOTE - FOLLOW UP  Pharmacy Consult for Electrolyte Monitoring and Replacement   Recent Labs: Potassium (mmol/L)  Date Value  03/10/2021 3.7  11/15/2013 3.6   Magnesium (mg/dL)  Date Value  03/10/2021 2.1   Calcium (mg/dL)  Date Value  03/10/2021 8.1 (L)   Calcium, Total (mg/dL)  Date Value  10/22/2012 8.6   Albumin (g/dL)  Date Value  03/08/2021 3.2 (L)  10/21/2012 2.5 (L)   Phosphorus (mg/dL)  Date Value  03/10/2021 3.0   Sodium (mmol/L)  Date Value  03/10/2021 141  10/22/2012 139     Assessment: 80yo F with PMH of COPD, CHF, CKD, DM who was admitted to the hospital for cardiogenic shock. Pharmacy has been consulted for electrolyte monitoring and replacement.   Labs Na 139>141 K 4.0>3.7 Phos 3.3>3.0 Mg 2.2>2.1  Goal of Therapy:  Electrolytes WNL  Plan:  --No replacement is currently indicated  --Will continue to monitor and replace as clinically indicated   Narda Rutherford, PharmD Pharmacy Resident  03/10/2021 12:16 PM

## 2021-03-10 NOTE — Progress Notes (Signed)
SLP Cancellation Note  Patient Details Name: Mckenzie Taylor MRN: [id number] DOB: [birth year]   Cancelled treatment:       Reason Eval/Treat Not Completed: SLP screened, no needs identified, will sign off (chart reviewed; consulted NSG re: pt's status today). Per NSG report, pt has been drinking thin liquids and tolerating them well w/ no overt distress/coughing or decline in status. No dyspnea and off Bipap currently. Per Palliative Care note, pt is "D/C'ing home today with Hospice and is happy with this plan." .  Due to pt's current presentation and toleration of po's w/out discomfort, will hold on any BSE at this time. Recommend general aspiration precautions w/ oral intake. ST services will be available to NSG for further consult if new needs arise b/f D/C. NSG agreed.      Orinda Kenner, MS, CCC-SLP Speech Language Pathologist Rehab Services [phone] [facility] 03/10/2021, 11:56 AM

## 2021-03-10 NOTE — Progress Notes (Signed)
Daily Progress Note   Patient Name: Mckenzie Taylor       Date: 03/10/2021 DOB: [birth year]  Age: 80 y.o. MRN#: [id number] Attending Physician: Tyler Pita, MD Primary Care Physician: Denton Lank, MD Admit Date: 03/08/2021  Reason for Consultation/Follow-up: Establishing goals of care  Subjective: Patient is resting in bed with chaplain at bedside. No family present. She appears to be in good spirits. No dyspnea noted. She understands the plan to D/C home today with hospice and is happy with this plan.   Length of Stay: 2  Current Medications: Scheduled Meds:   arformoterol  15 mcg Nebulization BID   budesonide (PULMICORT) nebulizer solution  0.25 mg Nebulization BID   Chlorhexidine Gluconate Cloth  6 each Topical Daily   heparin  5,000 Units Subcutaneous Q8H   insulin aspart  0-9 Units Subcutaneous Q4H   mouth rinse  15 mL Mouth Rinse BID   revefenacin  175 mcg Nebulization Daily    Continuous Infusions:  sodium chloride     cefTRIAXone (ROCEPHIN)  IV Stopped (03/09/21 1811)   famotidine (PEPCID) IV 100 mL/hr at 03/10/21 1000    PRN Meds: albuterol, docusate sodium, oxyCODONE-acetaminophen, polyethylene glycol, promethazine, tiZANidine  Physical Exam Pulmonary:     Effort: Pulmonary effort is normal.  Neurological:     Mental Status: She is alert.            Vital Signs: BP (!) 158/91    Pulse 87    Temp 98.1 F (36.7 C)    Resp (!) 30    Ht 5\' 4"  (1.626 m)    Wt 85 kg    SpO2 96%    BMI 32.17 kg/m  SpO2: SpO2: 96 % O2 Device: O2 Device: Nasal Cannula O2 Flow Rate: O2 Flow Rate (L/min): 4 L/min  Intake/output summary:  Intake/Output Summary (Last 24 hours) at 03/10/2021 1101 Last data filed at 03/10/2021 1000 Gross per 24 hour  Intake 131.37 ml  Output  2425 ml  Net -2293.63 ml   LBM: Last BM Date:  (PTA) Baseline Weight: Weight: 86.6 kg Most recent weight: Weight: 85 kg        Patient Active Problem List   Diagnosis Date Noted   Syncope 81/12/3157   Acute metabolic encephalopathy 45/85/9292   Hypothermia 03/08/2021   Respiratory acidosis 03/08/2021  Unresponsiveness 03/08/2021   Shock ([place]) 03/08/2021   Unstable angina ([place])    Hypothyroidism 12/24/2020   COVID-19 virus infection 11/29/2020   Acute on chronic HFrEF (heart failure with reduced ejection fraction) ([place]) 11/27/2020   Avascular necrosis of femoral head ([place]) 11/27/2020   Vitamin B12 deficiency 11/27/2020   HTN (hypertension) 09/16/2020   HLD (hyperlipidemia) 09/16/2020   Diabetes type 2, controlled ([place]) 09/16/2020   CKD (chronic kidney disease), stage IV ([place]) 09/16/2020   Depression 09/16/2020   Chronic combined systolic and diastolic heart failure ([place]) 09/16/2020   Acute on chronic combined systolic and diastolic CHF (congestive heart failure) ([place]) 11/10/2018   Chest pain 11/08/2018   COPD (chronic obstructive pulmonary disease) ([place]) 11/25/2016   Acute on chronic respiratory failure with hypoxia and hypercapnia ([place]) 10/25/2016   COPD with acute exacerbation ([place]) 08/14/2016   HCAP (healthcare-associated pneumonia) 93/23/5573   Acute systolic CHF (congestive heart failure) ([place]) 08/14/2016   GERD (gastroesophageal reflux disease) 08/14/2016   Adrenal mass, left ([place]) 01/11/2012   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 Hospice involved and working with patient and family.    Code Status:    Code Status Orders  (From admission, onward)           Start     Ordered   03/08/21 0419  Do not attempt resuscitation (DNR)  Continuous       Question Answer Comment   In the event of cardiac or respiratory ARREST Do not call a code blue   In the event of cardiac or respiratory ARREST Do not perform Intubation, CPR, defibrillation or ACLS   In the event of cardiac or respiratory ARREST Use medication by any route, position, wound care, and other measures to relive pain and suffering. May use oxygen, suction and manual treatment of airway obstruction as needed for comfort.      03/08/21 0420           Code Status History     Date Active Date Inactive Code Status Order ID Comments User Context   12/24/2020 2151 12/29/2020 2142 Full Code [id number]  Para Skeans, MD ED   12/24/2020 2055 12/24/2020 2151 Full Code [id number]  Para Skeans, MD ED   11/27/2020 1156 11/30/2020 2358 Partial Code [id number]  Harvie Heck, MD ED   10/16/2020 1113 10/20/2020 1523 Full Code [id number]  Jonetta Osgood, MD ED   09/16/2020 0857 09/18/2020 2049 DNR [id number]  Ivor Costa, MD ED   11/08/2018 1557 11/11/2018 1500 DNR [id number]  Sela Hua, MD Inpatient   02/10/2018 2149 02/12/2018 2141 Full Code [id number]  Demetrios Loll, MD Inpatient   01/04/2018 1347 01/10/2018 1449 Full Code [id number]  Hillary Bow, MD ED   01/07/2017 1624 01/08/2017 1240 DNR [id number]  Asencion Gowda, NP Inpatient   01/06/2017 1838 01/07/2017 1624 Partial Code [id number]  Vernell Morgans, RN Inpatient   01/06/2017 1548 01/06/2017 1838 Full Code [id number]  Bettey Costa, MD Inpatient   01/06/2017 1233 01/06/2017 1548 DNR [id number]  Bettey Costa, MD Inpatient   11/26/2016 1003 11/27/2016 1545 DNR [id number]  Hillary Bow, MD Inpatient   11/25/2016 2126 11/26/2016 1003 Full Code [id number]  Bettey Costa, MD Inpatient   10/25/2016 1614 10/29/2016 1725 Full Code [id number]  Henreitta Leber, MD ED   08/14/2016 2332 08/19/2016 1657 Full Code [id number]  Lance Coon, MD Inpatient   05/05/2016 0747 05/07/2016 1804 Full Code [id number]  Flora Lipps, MD ED   03/24/2016 1416 03/28/2016 1424 Full Code [id number]  Awilda Bill, NP ED      Advance Directive Documentation    [place] Most Recent Value  Type of Advance Directive Out of facility DNR (pink MOST or yellow form)  [MOST]  Pre-existing out of facility DNR order (yellow form or pink MOST form) Pink MOST form placed in chart (order not valid for inpatient use)  "MOST" Form in Place? --       Prognosis:  < 6 months    Thank you for allowing the Palliative Medicine Team to assist in the care of this patient.       Total Time 15 min Prolonged Time Billed  no       Greater than 50%  of this time was spent counseling and coordinating care related to the above assessment and plan.  Asencion Gowda, NP  Please contact Palliative Medicine Team phone at [phone] for questions and concerns.

## 2021-03-10 NOTE — Discharge Summary (Signed)
Physician Discharge Summary  Patient ID: Mckenzie Taylor MRN: [id number] DOB/AGE: Apr 30, 1940 80 y.o.  Admit date: 03/08/2021 Discharge date: 03/10/2021   Brief Pt Description / Synopsis:  80 y.o. Female admitted with Acute on Chronic Hypercapnic Respiratory Failure in the setting of AECOPD, Acute Decompensated combined systolic & diastolic CHF, Community Acquired Pneumonia, and Multifactorial shock.  Required BiPAP and vasopressors.  Pt and family have decided to go home with Hospice.   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 Per her husband, who is blind, he heard her fall and try to get back up- at that time EMS was called. The patient's son, Mckenzie Taylor, & husband discuss at length a recent failure to thrive picture. They report that the patient has not been compliant with medications, uninterested in eating/drinking, and has been unable to get up out of bed and move around for the last few days. No new complaints other than fatigue. ED course: Patient arrived on NRB @ 15 L, cool to touch but alert per ED documentation. Medications given: 1 L LR bolus, Cefepime & vancomycin, levophed drip started Initial Vitals: hypothermic 91.8, RR 19, SB 55, BP initially 98/58 then dropped into 80's & 70's Significant labs: (Labs/ Imaging personally reviewed) I, Domingo Pulse [place], AGACNP-BC, personally viewed and interpreted this ECG. EKG Interpretation: Date: 03/08/21, EKG Time: 00:34, Rate: 76, Rhythm: NSR, QRS Axis:  normal, Intervals: normal, ST/T  Wave abnormalities: none, Narrative Interpretation: NSR (challenging read due to artifact and wandering leads) Chemistry: Na+:136, K+: 5.5, BUN/Cr.: 45/2.47, Serum CO2/ AG: 26/6 Hematology: WBC: 12.5, Hgb: 9.9,  Troponin: 19 > 15, BNP: 1977.6, Lactic/ PCT: 0.9 > 0.8/ pending, CK: 61 COVID-19 & Influenza A/B: pending, Resp panel: pending VBG: 7.10/ 90/ 53/ 27.9 CXR 03/08/21: R pleural effusion with basilar consolidation/atelectasis CT head wo contrast 03/08/21: Chronic ischemic microangiopathy and generalized atrophy without acute intracranial abnormality. CT cervical spine wo contrast 03/08/21: No acute fracture or static subluxation of the cervical spine.   PCCM consulted for admission due to vasopressor administration.  Please see ' [place]" section below for full detailed hospital course.   Discharge Plan by Diagnosis:   Acute on Chronic Hypercapnic Respiratory Failure secondary to combined heart failure exacerbation, AECOPD vs CAP  PMHx: COPD on chronic 3 L [place], combined CHF -During hospital stay required BiPAP and supplemental O2 to maintain O2 sats >88% -Received Ceftriaxone for CAP coverage -Bronchodilators -Pt is being discharge home with Hospice: Morphine as needed per Hospice for pain/discomfort/shortness of breath/air hunger   Significant Events:  03/08/21: Admit to ICU with circulatory shock requiring vasopressor administration 03/09/21: Waxing and waning mental status.  On BiPAP as needed. Palliative Care consulted  03/10/21: Pt and family request to go home with hospice.  Significant Diagnostic Studies:  03/08/21: CXR>>Patient rotated to the right. Mild cardiomegaly. Right pleural effusion with basilar consolidation/atelectasis. Mild left basilar opacity. 03/08/21: CT Head & Cervical Spine>>IMPRESSION: 1. Chronic ischemic microangiopathy and generalized atrophy without acute intracranial abnormality. 2. No acute fracture or static  subluxation of the cervical spine.            Micro Data:  03/08/21: SARS-CoV-2 & Influenza PCR>> negative 03/08/21: Blood culture x2>> no growth to date 03/08/21: MRSA PCR>> not detected 03/08/21: Respiratory Viral panel>> negative   Antimicrobials:  Cefepime 12/25 x1 dose Vancomycin 12/25 x1 dose Ceftriaxone 12/25>>12/27   Consults:  PCCM Palliative Care   Discharge Exam:   General: Adult female, chronically ill appearing, lying in bed, NAD on BIPAP  HEENT: MM pink/moist, anicteric, atraumatic, neck supple Neuro: RASS -4, unable to follow commands, PERRL +3, moves BUE against gravity, withdraws with BLE CV: s1s2 RRR, SB on monitor, no r/m/g Pulm: Regular, non labored on BIPAP @ 45%, breath sounds diminished throughout GI: soft, rounded, bs x 4 Skin: skin tear on L forearm, scattered ecchymosis Extremities: warm/dry, pulses + 2 R/P, +3 edema noted bilateral feet, and periorbital  Vitals:   03/10/21 1100 03/10/21 1200 03/10/21 1300 03/10/21 1400  BP: (!) 157/73 (!) 154/68 (!) 146/87 (!) 160/90  Pulse: 90 93 73 96  Resp: (!) 22 20 (!) 26 20  Temp: 98.1 F (36.7 C) 98.2 F (36.8 C) 98.2 F (36.8 C) 98.4 F (36.9 C)  TempSrc:      SpO2: 95% 94% 98% 96%  Weight:      Height:         Discharge Labs:   BMET Recent Labs  Lab 03/08/21 0040 03/08/21 0325 03/09/21 0508 03/10/21 0513  NA 136 136 139 141  K 5.5* 5.5* 4.0 3.7  CL 104 104 106 104  CO2 26 28 26 29   GLUCOSE 275* 284* 100* 106*  BUN 45* 47* 49* 42*  CREATININE 2.47* 2.55* 3.09* 2.63*  CALCIUM 8.4* 8.1* 7.8* 8.1*  MG  --  2.5* 2.2 2.1  PHOS  --  7.7* 3.3 3.0    CBC Recent Labs  Lab 03/08/21 0040 03/08/21 0705  HGB 9.9* 8.8*  HCT 36.3 31.3*  WBC 12.5* 10.2  PLT 361 374    Anti-Coagulation Recent Labs  Lab 03/08/21 0040  INR 1.0          Allergies as of 03/10/2021       Reactions   Other Rash   Pt reports allergy to metals. Patient also reports allergic to "ice" and it  makes her skin swell where it touches.  She states she can drink water with no issues.        Medication List     TAKE these medications    albuterol 108 (90 Base) MCG/ACT inhaler Commonly known as: VENTOLIN HFA Inhale 2 puffs into the lungs every 4 (four) hours as needed for wheezing or shortness of breath.   aspirin 81 MG EC tablet Take 1 tablet (81 mg total) by mouth daily.   busPIRone 5 MG tablet Commonly known as: BUSPAR Take 5 mg by mouth 2 (two) times daily.   carvedilol 12.5 MG tablet Commonly known as: COREG Take 1 tablet (12.5 mg total) by mouth 2 (two) times daily with a meal.   Cyanocobalamin 1500 MCG Tbdp Take 4,500 mcg by mouth daily.   famotidine 20 MG tablet Commonly known as: PEPCID Take 1 tablet (20 mg total) by mouth daily.  Fluticasone-Umeclidin-Vilant 100-62.5-25 MCG/INH Aepb Inhale 1 puff into the lungs daily.   insulin detemir 100 UNIT/ML FlexPen Commonly known as: LEVEMIR Inject 42 Units into the skin at bedtime.   insulin lispro 100 UNIT/ML KwikPen Commonly known as: HUMALOG Inject 0-22 Units into the skin 3 (three) times daily with meals. Sliding scale   isosorbide mononitrate 30 MG 24 hr tablet Commonly known as: IMDUR Take 1 tablet (30 mg total) by mouth daily.   nitroGLYCERIN 0.4 MG SL tablet Commonly known as: NITROSTAT Place 1 tablet (0.4 mg total) under the tongue every 5 (five) minutes as needed for chest pain.   oxyCODONE-acetaminophen 5-325 MG tablet Commonly known as: PERCOCET/ROXICET Take 1 tablet by mouth at bedtime.   rosuvastatin 40 MG tablet Commonly known as: CRESTOR Take 1 tablet (40 mg total) by mouth daily.   tiZANidine 4 MG tablet Commonly known as: ZANAFLEX Take 4 mg by mouth 3 (three) times daily as needed for muscle spasms.   traZODone 50 MG tablet Commonly known as: DESYREL Take 50 mg by mouth at bedtime.            Disposition: Home with HOSPICE  Discharged Condition: LEXANI CORONA has  met maximum benefit of inpatient care and is medically stable and cleared for discharge.  Patient is pending follow up as above.      Time spent on disposition:  50 Minutes.     Signed: Darel Hong, AGACNP-BC [facility] Prefer epic messenger for cross cover needs If after hours, please call E-link

## 2021-03-10 NOTE — Progress Notes (Signed)
Report given to EMT transport. Patient with no complaints at the current time. Discharge instructions given to patient with no questions.

## 2021-03-10 NOTE — Progress Notes (Signed)
Chaplain Maggie made follow up visit at bedside after meeting pt in ED a few days ago. Pt alert and engaging during visit. Prayer was offered as pt seeks peace. Pt is transitioning to hospice care. She acknowledged being at peace with decision and shared, "I am going home." Continued spiritual care available per on call chaplain as needed.

## 2021-03-10 NOTE — Progress Notes (Addendum)
[facility])  DME set to be delivered by 2 pm.  Pt spouse, Vinson Moselle [phone], works for an ambulance transportation service, [employer], and would like for his agency to transport her home.  Please leave foley catheter in place for EOL care.   Updated TOC manager with his request and they will follow up and arrange transport.  Thank you, Venia Carbon BSN, RN [facility] Liaison

## 2021-03-13 LAB — CULTURE, BLOOD (ROUTINE X 2)
Culture: NO GROWTH
Culture: NO GROWTH
Special Requests: ADEQUATE
Special Requests: ADEQUATE

## 2021-03-15 DEATH — deceased

## 2023-07-24 IMAGING — CR DG CHEST 2V
2 series · 2 of 2 positions shown · non-contrast
Comparison: 08/17/2020.

CLINICAL DATA: Chest pain.

EXAM:
CHEST - 2 VIEW

[chest pa]
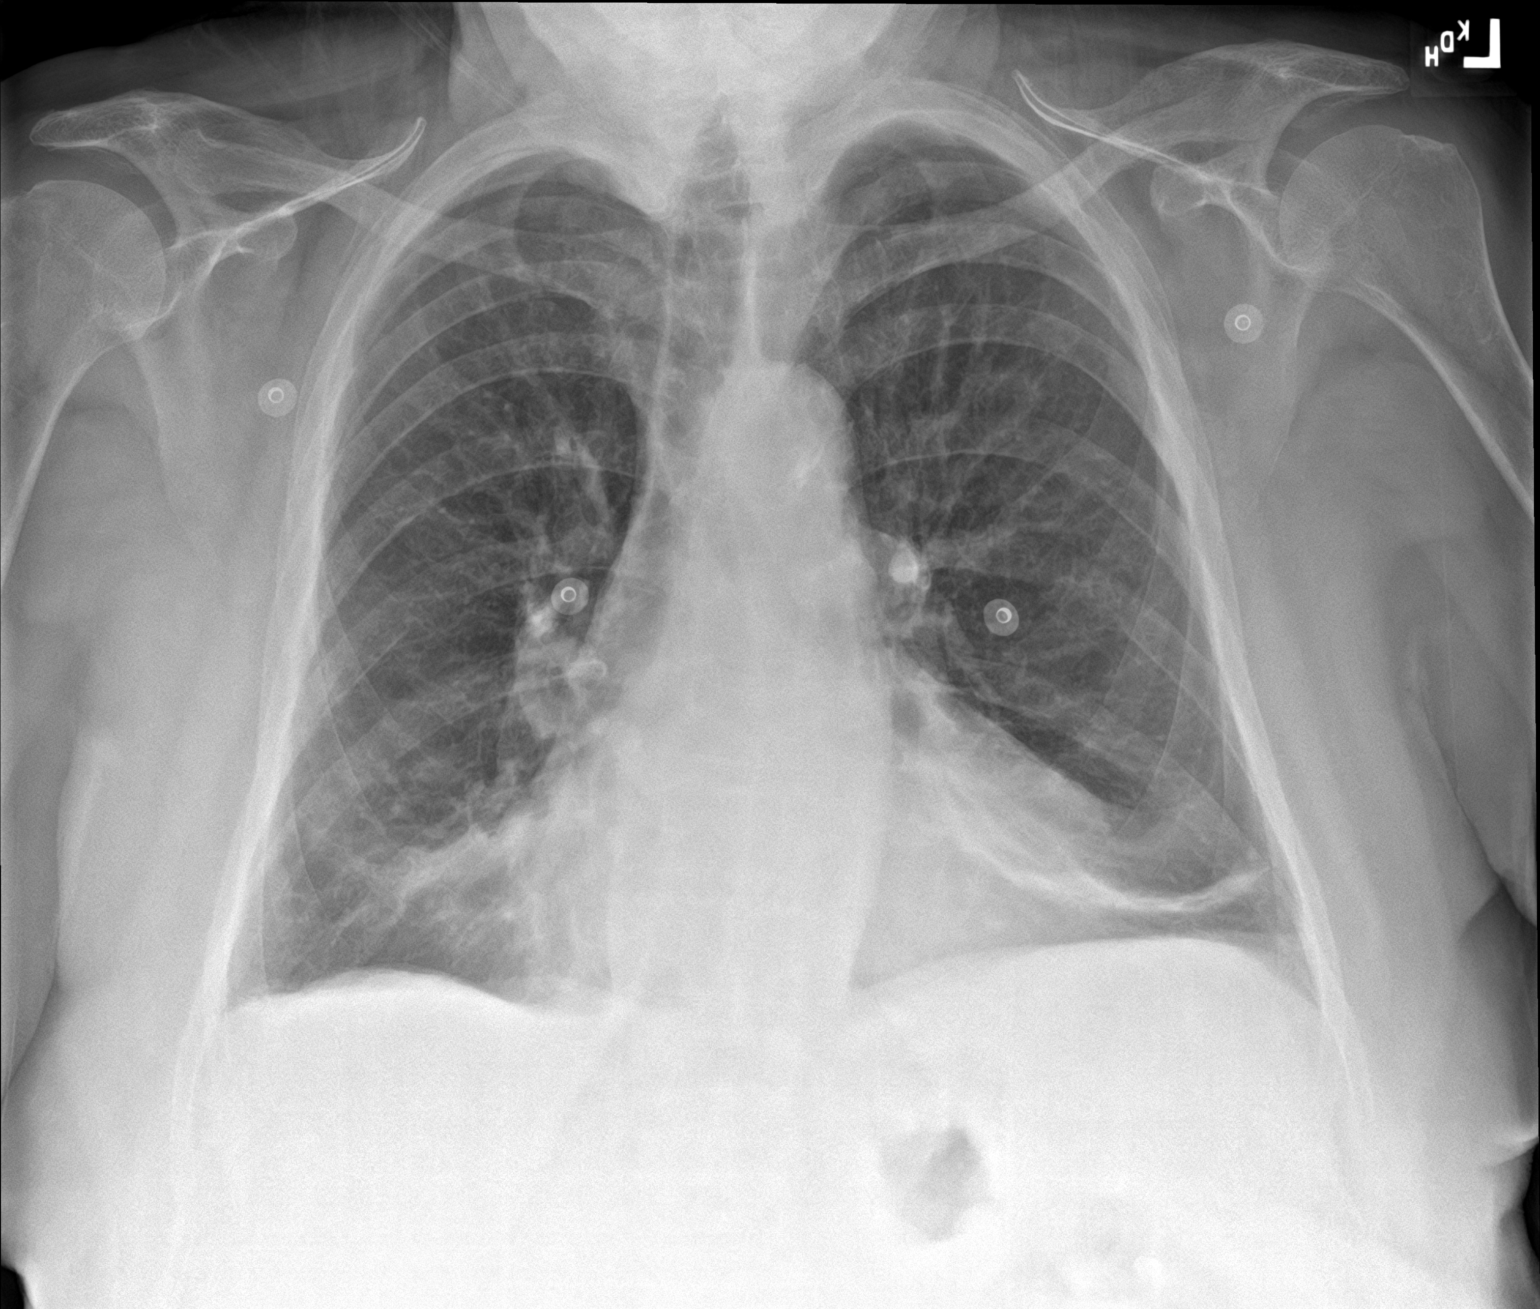

[chest lat]
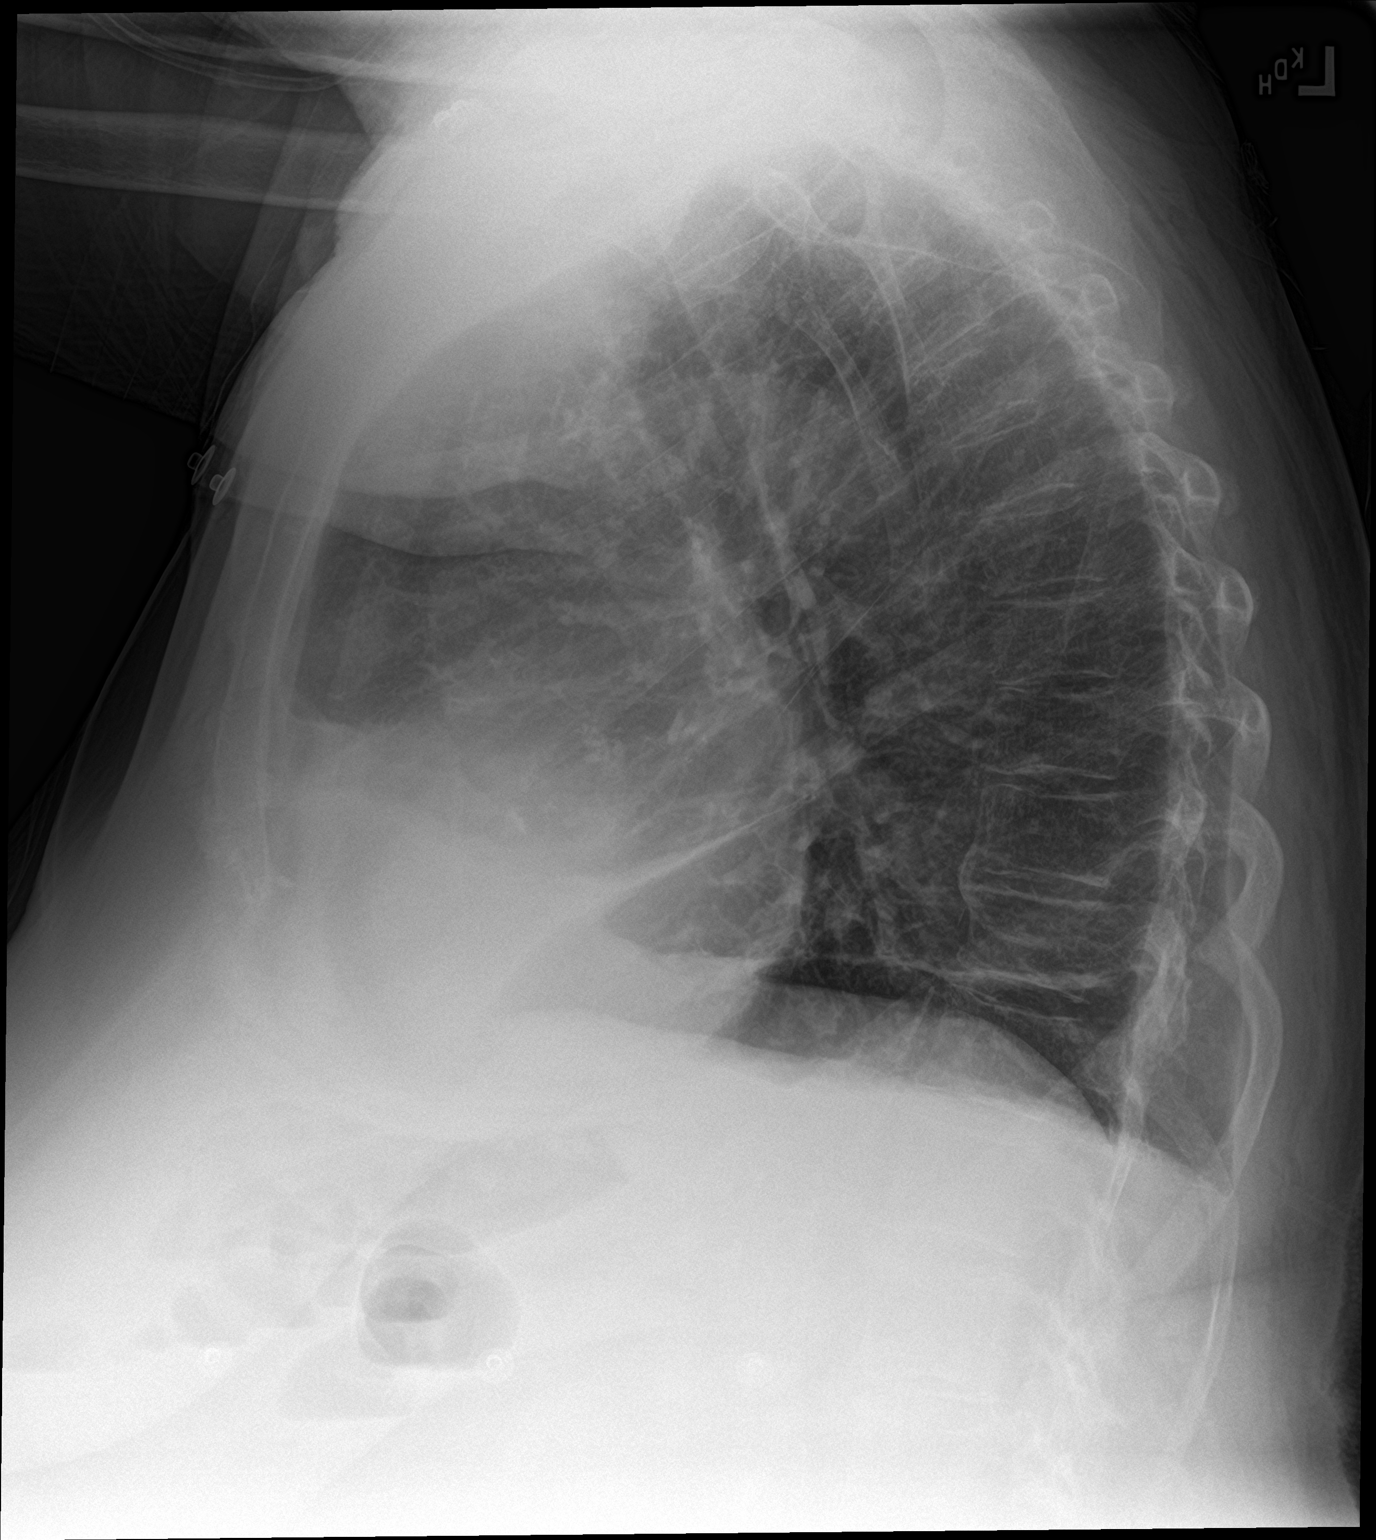

[2 of 2 positions shown; findings below may reference images not displayed]

FINDINGS: Mediastinum and hilar structures normal. Low lung volumes with
bibasilar atelectasis. No pleural effusion or pneumothorax. Stable
cardiomegaly. No pulmonary venous congestion. No acute bony
abnormality.
IMPRESSION: 1.  Stable cardiomegaly.  No pulmonary venous congestion.

2.  Low lung volumes with bibasilar atelectasis.
